# Patient Record
Sex: Female | Born: 1935 | Race: Black or African American | Hispanic: No | Marital: Married | Smoking: Never smoker
Health system: Southern US, Community
[De-identification: ages and names within clinical notes are randomized; demographics above are authoritative.]

## PROBLEM LIST (undated history)

## (undated) DIAGNOSIS — E119 Type 2 diabetes mellitus without complications: Secondary | ICD-10-CM

## (undated) DIAGNOSIS — I4891 Unspecified atrial fibrillation: Secondary | ICD-10-CM

## (undated) DIAGNOSIS — G4733 Obstructive sleep apnea (adult) (pediatric): Secondary | ICD-10-CM

## (undated) DIAGNOSIS — I10 Essential (primary) hypertension: Secondary | ICD-10-CM

## (undated) DIAGNOSIS — I6529 Occlusion and stenosis of unspecified carotid artery: Secondary | ICD-10-CM

## (undated) DIAGNOSIS — F0280 Dementia in other diseases classified elsewhere without behavioral disturbance: Secondary | ICD-10-CM

## (undated) DIAGNOSIS — K759 Inflammatory liver disease, unspecified: Secondary | ICD-10-CM

## (undated) DIAGNOSIS — G9389 Other specified disorders of brain: Secondary | ICD-10-CM

## (undated) DIAGNOSIS — D649 Anemia, unspecified: Secondary | ICD-10-CM

## (undated) DIAGNOSIS — Z9289 Personal history of other medical treatment: Secondary | ICD-10-CM

## (undated) DIAGNOSIS — N183 Chronic kidney disease, stage 3 unspecified: Secondary | ICD-10-CM

## (undated) DIAGNOSIS — B1920 Unspecified viral hepatitis C without hepatic coma: Secondary | ICD-10-CM

## (undated) DIAGNOSIS — E785 Hyperlipidemia, unspecified: Secondary | ICD-10-CM

## (undated) DIAGNOSIS — Z7901 Long term (current) use of anticoagulants: Secondary | ICD-10-CM

## (undated) DIAGNOSIS — I639 Cerebral infarction, unspecified: Secondary | ICD-10-CM

## (undated) DIAGNOSIS — G309 Alzheimer's disease, unspecified: Secondary | ICD-10-CM

## (undated) HISTORY — DX: Chronic kidney disease, stage 3 unspecified: N18.30

## (undated) HISTORY — PX: CATARACT EXTRACTION, BILATERAL: SHX1313

## (undated) HISTORY — DX: Type 2 diabetes mellitus without complications: E11.9

## (undated) HISTORY — DX: Chronic kidney disease, stage 3 (moderate): N18.3

## (undated) HISTORY — DX: Personal history of other medical treatment: Z92.89

## (undated) HISTORY — DX: Essential (primary) hypertension: I10

## (undated) HISTORY — DX: Hyperlipidemia, unspecified: E78.5

---

## 1983-07-04 HISTORY — PX: ABDOMINAL HYSTERECTOMY: SHX81

## 2011-07-18 DIAGNOSIS — E119 Type 2 diabetes mellitus without complications: Secondary | ICD-10-CM | POA: Diagnosis not present

## 2011-07-25 DIAGNOSIS — IMO0001 Reserved for inherently not codable concepts without codable children: Secondary | ICD-10-CM | POA: Diagnosis not present

## 2011-07-25 DIAGNOSIS — E7800 Pure hypercholesterolemia, unspecified: Secondary | ICD-10-CM | POA: Diagnosis not present

## 2011-07-25 DIAGNOSIS — R809 Proteinuria, unspecified: Secondary | ICD-10-CM | POA: Diagnosis not present

## 2011-07-25 DIAGNOSIS — I10 Essential (primary) hypertension: Secondary | ICD-10-CM | POA: Diagnosis not present

## 2011-07-25 DIAGNOSIS — E785 Hyperlipidemia, unspecified: Secondary | ICD-10-CM | POA: Diagnosis not present

## 2011-10-23 DIAGNOSIS — R809 Proteinuria, unspecified: Secondary | ICD-10-CM | POA: Diagnosis not present

## 2011-10-23 DIAGNOSIS — E785 Hyperlipidemia, unspecified: Secondary | ICD-10-CM | POA: Diagnosis not present

## 2011-10-23 DIAGNOSIS — E119 Type 2 diabetes mellitus without complications: Secondary | ICD-10-CM | POA: Diagnosis not present

## 2011-10-30 DIAGNOSIS — IMO0001 Reserved for inherently not codable concepts without codable children: Secondary | ICD-10-CM | POA: Diagnosis not present

## 2011-10-30 DIAGNOSIS — E785 Hyperlipidemia, unspecified: Secondary | ICD-10-CM | POA: Diagnosis not present

## 2011-10-30 DIAGNOSIS — R809 Proteinuria, unspecified: Secondary | ICD-10-CM | POA: Diagnosis not present

## 2011-10-30 DIAGNOSIS — I10 Essential (primary) hypertension: Secondary | ICD-10-CM | POA: Diagnosis not present

## 2011-11-23 DIAGNOSIS — Z1211 Encounter for screening for malignant neoplasm of colon: Secondary | ICD-10-CM | POA: Diagnosis not present

## 2011-11-23 DIAGNOSIS — L290 Pruritus ani: Secondary | ICD-10-CM | POA: Diagnosis not present

## 2011-11-23 DIAGNOSIS — R809 Proteinuria, unspecified: Secondary | ICD-10-CM | POA: Diagnosis not present

## 2011-11-23 DIAGNOSIS — I10 Essential (primary) hypertension: Secondary | ICD-10-CM | POA: Diagnosis not present

## 2011-11-23 DIAGNOSIS — E119 Type 2 diabetes mellitus without complications: Secondary | ICD-10-CM | POA: Diagnosis not present

## 2012-08-01 DIAGNOSIS — E119 Type 2 diabetes mellitus without complications: Secondary | ICD-10-CM | POA: Diagnosis not present

## 2012-08-08 DIAGNOSIS — E119 Type 2 diabetes mellitus without complications: Secondary | ICD-10-CM | POA: Diagnosis not present

## 2012-08-08 DIAGNOSIS — I10 Essential (primary) hypertension: Secondary | ICD-10-CM | POA: Diagnosis not present

## 2012-08-08 DIAGNOSIS — E785 Hyperlipidemia, unspecified: Secondary | ICD-10-CM | POA: Diagnosis not present

## 2012-10-28 DIAGNOSIS — E785 Hyperlipidemia, unspecified: Secondary | ICD-10-CM | POA: Diagnosis not present

## 2012-10-28 DIAGNOSIS — E119 Type 2 diabetes mellitus without complications: Secondary | ICD-10-CM | POA: Diagnosis not present

## 2012-10-28 DIAGNOSIS — I10 Essential (primary) hypertension: Secondary | ICD-10-CM | POA: Diagnosis not present

## 2012-11-19 DIAGNOSIS — I10 Essential (primary) hypertension: Secondary | ICD-10-CM | POA: Diagnosis not present

## 2013-03-13 DIAGNOSIS — I10 Essential (primary) hypertension: Secondary | ICD-10-CM | POA: Diagnosis not present

## 2013-03-13 DIAGNOSIS — Z1231 Encounter for screening mammogram for malignant neoplasm of breast: Secondary | ICD-10-CM | POA: Diagnosis not present

## 2013-03-13 DIAGNOSIS — E119 Type 2 diabetes mellitus without complications: Secondary | ICD-10-CM | POA: Diagnosis not present

## 2013-03-13 DIAGNOSIS — E785 Hyperlipidemia, unspecified: Secondary | ICD-10-CM | POA: Diagnosis not present

## 2013-10-20 DIAGNOSIS — E1129 Type 2 diabetes mellitus with other diabetic kidney complication: Secondary | ICD-10-CM | POA: Diagnosis not present

## 2013-10-20 DIAGNOSIS — L538 Other specified erythematous conditions: Secondary | ICD-10-CM | POA: Diagnosis not present

## 2013-10-20 DIAGNOSIS — I2510 Atherosclerotic heart disease of native coronary artery without angina pectoris: Secondary | ICD-10-CM | POA: Diagnosis not present

## 2013-10-20 DIAGNOSIS — E1165 Type 2 diabetes mellitus with hyperglycemia: Secondary | ICD-10-CM | POA: Diagnosis not present

## 2013-10-20 DIAGNOSIS — N182 Chronic kidney disease, stage 2 (mild): Secondary | ICD-10-CM | POA: Diagnosis not present

## 2013-10-20 DIAGNOSIS — I10 Essential (primary) hypertension: Secondary | ICD-10-CM | POA: Diagnosis not present

## 2013-10-20 DIAGNOSIS — E785 Hyperlipidemia, unspecified: Secondary | ICD-10-CM | POA: Diagnosis not present

## 2013-11-04 LAB — HM DIABETES EYE EXAM

## 2013-11-09 ENCOUNTER — Inpatient Hospital Stay: Payer: Self-pay | Admitting: Internal Medicine

## 2013-11-09 DIAGNOSIS — J9819 Other pulmonary collapse: Secondary | ICD-10-CM | POA: Diagnosis not present

## 2013-11-09 DIAGNOSIS — T383X1A Poisoning by insulin and oral hypoglycemic [antidiabetic] drugs, accidental (unintentional), initial encounter: Secondary | ICD-10-CM | POA: Diagnosis present

## 2013-11-09 DIAGNOSIS — E785 Hyperlipidemia, unspecified: Secondary | ICD-10-CM | POA: Diagnosis present

## 2013-11-09 DIAGNOSIS — Z7982 Long term (current) use of aspirin: Secondary | ICD-10-CM | POA: Diagnosis not present

## 2013-11-09 DIAGNOSIS — E119 Type 2 diabetes mellitus without complications: Secondary | ICD-10-CM | POA: Diagnosis not present

## 2013-11-09 DIAGNOSIS — R4182 Altered mental status, unspecified: Secondary | ICD-10-CM | POA: Diagnosis not present

## 2013-11-09 DIAGNOSIS — E162 Hypoglycemia, unspecified: Secondary | ICD-10-CM | POA: Diagnosis not present

## 2013-11-09 DIAGNOSIS — E1169 Type 2 diabetes mellitus with other specified complication: Secondary | ICD-10-CM | POA: Diagnosis present

## 2013-11-09 DIAGNOSIS — I10 Essential (primary) hypertension: Secondary | ICD-10-CM | POA: Diagnosis present

## 2013-11-09 LAB — CBC
HCT: 36.3 % (ref 35.0–47.0)
HGB: 12.1 g/dL (ref 12.0–16.0)
MCH: 29.1 pg (ref 26.0–34.0)
MCHC: 33.5 g/dL (ref 32.0–36.0)
MCV: 87 fL (ref 80–100)
Platelet: 183 10*3/uL (ref 150–440)
RBC: 4.18 10*6/uL (ref 3.80–5.20)
RDW: 14 % (ref 11.5–14.5)
WBC: 5.5 10*3/uL (ref 3.6–11.0)

## 2013-11-09 LAB — URINALYSIS, COMPLETE
Bacteria: NONE SEEN
Bilirubin,UR: NEGATIVE
Blood: NEGATIVE
Glucose,UR: NEGATIVE mg/dL (ref 0–75)
Ketone: NEGATIVE
Nitrite: NEGATIVE
Ph: 7 (ref 4.5–8.0)
Protein: 30
RBC,UR: <1 /HPF (ref 0–5)
Specific Gravity: 1.011 (ref 1.003–1.030)
Squamous Epithelial: 1
WBC UR: 3 /HPF (ref 0–5)

## 2013-11-09 LAB — COMPREHENSIVE METABOLIC PANEL
Albumin: 3.8 g/dL (ref 3.4–5.0)
Alkaline Phosphatase: 59 U/L
Anion Gap: 5 — ABNORMAL LOW (ref 7–16)
BUN: 24 mg/dL — ABNORMAL HIGH (ref 7–18)
Bilirubin,Total: 0.2 mg/dL (ref 0.2–1.0)
Calcium, Total: 9.4 mg/dL (ref 8.5–10.1)
Chloride: 106 mmol/L (ref 98–107)
Co2: 30 mmol/L (ref 21–32)
Creatinine: 1.19 mg/dL (ref 0.60–1.30)
EGFR (African American): 51 — ABNORMAL LOW
EGFR (Non-African Amer.): 44 — ABNORMAL LOW
Glucose: 33 mg/dL — CL (ref 65–99)
Osmolality: 282 (ref 275–301)
Potassium: 4.4 mmol/L (ref 3.5–5.1)
SGOT(AST): 29 U/L (ref 15–37)
SGPT (ALT): 27 U/L (ref 12–78)
Sodium: 141 mmol/L (ref 136–145)
Total Protein: 9 g/dL — ABNORMAL HIGH (ref 6.4–8.2)

## 2013-11-09 LAB — TROPONIN I: Troponin-I: <0.02 ng/mL

## 2013-11-10 DIAGNOSIS — E162 Hypoglycemia, unspecified: Secondary | ICD-10-CM | POA: Diagnosis not present

## 2013-11-10 DIAGNOSIS — I10 Essential (primary) hypertension: Secondary | ICD-10-CM | POA: Diagnosis not present

## 2013-11-10 DIAGNOSIS — E119 Type 2 diabetes mellitus without complications: Secondary | ICD-10-CM | POA: Diagnosis not present

## 2013-11-10 LAB — BASIC METABOLIC PANEL
Anion Gap: 5 — ABNORMAL LOW (ref 7–16)
BUN: 22 mg/dL — ABNORMAL HIGH (ref 7–18)
Calcium, Total: 8.4 mg/dL — ABNORMAL LOW (ref 8.5–10.1)
Chloride: 110 mmol/L — ABNORMAL HIGH (ref 98–107)
Co2: 27 mmol/L (ref 21–32)
Creatinine: 1.12 mg/dL (ref 0.60–1.30)
EGFR (African American): 54 — ABNORMAL LOW
EGFR (Non-African Amer.): 47 — ABNORMAL LOW
Glucose: 70 mg/dL (ref 65–99)
Osmolality: 285 (ref 275–301)
Potassium: 4.6 mmol/L (ref 3.5–5.1)
Sodium: 142 mmol/L (ref 136–145)

## 2013-11-10 LAB — HEMOGLOBIN A1C
Hemoglobin A1C: 7.1 % — ABNORMAL HIGH (ref 4.2–6.3)
Hemoglobin A1C: 6.9 % — ABNORMAL HIGH (ref 4.2–6.3)

## 2013-11-11 DIAGNOSIS — E162 Hypoglycemia, unspecified: Secondary | ICD-10-CM | POA: Diagnosis not present

## 2013-11-11 DIAGNOSIS — I10 Essential (primary) hypertension: Secondary | ICD-10-CM | POA: Diagnosis not present

## 2013-11-11 DIAGNOSIS — E119 Type 2 diabetes mellitus without complications: Secondary | ICD-10-CM | POA: Diagnosis not present

## 2013-12-12 ENCOUNTER — Ambulatory Visit: Payer: Self-pay | Admitting: Adult Health

## 2014-01-13 ENCOUNTER — Ambulatory Visit (INDEPENDENT_AMBULATORY_CARE_PROVIDER_SITE_OTHER): Payer: Medicare Other | Admitting: Adult Health

## 2014-01-13 ENCOUNTER — Other Ambulatory Visit: Payer: Self-pay

## 2014-01-13 ENCOUNTER — Encounter: Payer: Self-pay | Admitting: Adult Health

## 2014-01-13 VITALS — BP 138/83 | HR 78 | Temp 97.9°F | Resp 14 | Ht 65.0 in | Wt 196.5 lb

## 2014-01-13 DIAGNOSIS — E785 Hyperlipidemia, unspecified: Secondary | ICD-10-CM

## 2014-01-13 DIAGNOSIS — I10 Essential (primary) hypertension: Secondary | ICD-10-CM | POA: Diagnosis not present

## 2014-01-13 MED ORDER — GLUCOSE BLOOD VI STRP: ORAL_STRIP | Status: DC

## 2014-01-13 MED ORDER — ONETOUCH ULTRASOFT LANCETS MISC: Status: DC

## 2014-01-13 NOTE — Progress Notes (Signed)
Pre visit review using our clinic review tool, if applicable. No additional management support is needed unless otherwise documented below in the visit note. 

## 2014-01-13 NOTE — Progress Notes (Signed)
[name], female   DOB: Mar 09, 1934, 81 y.o.   MRN: [id number]   Subjective:    [name], female    DOB: Sep 15, 1933, 80 y.o.   MRN: [id number]  HPI  Patient is a pleasant 80 year old female who presents to clinic to establish care. She moved to the [place] area from [place] in May 2015. Recent hospitalization at [facility] for hypoglycemic event. She reports being hospitalized for 2 days. She was on a sulfonylurea and this was discontinued prior to discharge from the hospital. Now only on metformin twice a day. Today, she is feeling well. No concerns. She has a hx of HTN, HLD. Will request records from previous PCP.   Past Medical History  Diagnosis Date  . Diabetes mellitus without complication   . Hypertension   . Hyperlipidemia   . History of blood transfusion      Past Surgical History  Procedure Laterality Date  . Abdominal hysterectomy  1985     Family History  Problem Relation Age of Onset  . Stroke Mother   . Diabetes Mother   . Heart disease Father   . Kidney disease Sister   . Diabetes Sister   . Kidney disease Brother   . Heart disease Sister   . Diabetes Sister   . Diabetes Sister   . Diabetes Sister   . Kidney disease Sister   . Heart disease Sister   . Kidney disease Brother     kidney transplant  . Early death Brother 65    Truck Accident - died  . Heart disease Brother      History   Social History  . Marital Status: Married    Spouse Name: N/A    Number of Children: 2  . Years of Education: N/A   Occupational History  . 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 Smoking status: Never Smoker   . Smokeless tobacco: Not on file  . Alcohol Use: No  . Drug Use: No  . Sexual Activity: Not on file   Other Topics Concern  . Not on file   Social History Narrative   She is married and has two adult children (adopted). She moved to [place] approximately 50 years ago for work. Her  and her husband moved back to [facility] in May.       Hobbies: Decorating   Exercise: None   Caffeine: 4 cups a week     Review of Systems  Constitutional: Negative.   HENT: Negative.   Eyes: Negative.   Respiratory: Negative.   Cardiovascular: Negative.   Gastrointestinal: Negative.   Endocrine: Negative.   Genitourinary: Negative.   Musculoskeletal: Negative.   Skin: Negative.   Allergic/Immunologic: Negative.   Neurological: Negative.   Hematological: Negative.   Psychiatric/Behavioral: Negative.        Objective:  BP 138/83  Pulse 78  Temp(Src) 97.9 F (36.6 C) (Oral)  Resp 14  Ht 5\' 5"  (1.651 m)  Wt 196 lb 8 oz (89.132 kg)  BMI 32.70 kg/m2  SpO2 99%   Physical Exam  Constitutional: She is oriented to person, place, and time. No distress.  Cardiovascular: Normal rate and regular rhythm.   Pulmonary/Chest: Effort normal. No respiratory distress.  Musculoskeletal: Normal range of motion.  Neurological: She is alert and oriented to person, place, and time.  Skin: Skin is warm and dry.  Psychiatric: She has a normal mood and affect.  Her behavior is normal. Judgment and thought content normal.      Assessment & Plan:   1. Essential hypertension Check metabolic panel. Continue medications. Follow - Basic metabolic panel  2. HLD (hyperlipidemia) She will need to return for fasting lipids. She does not recall when the last level was drawn. - Lipid panel; Future

## 2014-01-14 ENCOUNTER — Telehealth: Payer: Self-pay | Admitting: Adult Health

## 2014-01-14 NOTE — Telephone Encounter (Signed)
Relevant patient education mailed to patient.  

## 2014-01-29 ENCOUNTER — Ambulatory Visit (INDEPENDENT_AMBULATORY_CARE_PROVIDER_SITE_OTHER): Payer: Medicare Other | Admitting: Adult Health

## 2014-01-29 ENCOUNTER — Encounter: Payer: Self-pay | Admitting: Adult Health

## 2014-01-29 ENCOUNTER — Telehealth: Payer: Self-pay | Admitting: Adult Health

## 2014-01-29 ENCOUNTER — Other Ambulatory Visit: Payer: Self-pay | Admitting: Adult Health

## 2014-01-29 VITALS — BP 138/79 | HR 81 | Temp 97.9°F | Resp 14 | Ht 65.0 in | Wt 195.5 lb

## 2014-01-29 DIAGNOSIS — Z0000 Encounter for general adult medical examination without abnormal findings: Secondary | ICD-10-CM | POA: Diagnosis not present

## 2014-01-29 DIAGNOSIS — IMO0002 Reserved for concepts with insufficient information to code with codable children: Secondary | ICD-10-CM

## 2014-01-29 DIAGNOSIS — E119 Type 2 diabetes mellitus without complications: Secondary | ICD-10-CM | POA: Insufficient documentation

## 2014-01-29 DIAGNOSIS — E1165 Type 2 diabetes mellitus with hyperglycemia: Secondary | ICD-10-CM

## 2014-01-29 DIAGNOSIS — E785 Hyperlipidemia, unspecified: Secondary | ICD-10-CM | POA: Diagnosis not present

## 2014-01-29 DIAGNOSIS — R7989 Other specified abnormal findings of blood chemistry: Secondary | ICD-10-CM

## 2014-01-29 DIAGNOSIS — Z13820 Encounter for screening for osteoporosis: Secondary | ICD-10-CM | POA: Diagnosis not present

## 2014-01-29 DIAGNOSIS — E1129 Type 2 diabetes mellitus with other diabetic kidney complication: Secondary | ICD-10-CM | POA: Insufficient documentation

## 2014-01-29 DIAGNOSIS — IMO0001 Reserved for inherently not codable concepts without codable children: Secondary | ICD-10-CM

## 2014-01-29 LAB — LIPID PANEL
Cholesterol: 159 mg/dL (ref 0–200)
HDL: 40.6 mg/dL (ref >39.00)
LDL Cholesterol: 95 mg/dL (ref 0–99)
NonHDL: 118.4
Total CHOL/HDL Ratio: 4
Triglycerides: 119 mg/dL (ref 0.0–149.0)
VLDL: 23.8 mg/dL (ref 0.0–40.0)

## 2014-01-29 LAB — BASIC METABOLIC PANEL
BUN: 22 mg/dL (ref 6–23)
CO2: 23 mEq/L (ref 19–32)
Calcium: 8.9 mg/dL (ref 8.4–10.5)
Chloride: 102 mEq/L (ref 96–112)
Creatinine, Ser: 1.3 mg/dL — ABNORMAL HIGH (ref 0.4–1.2)
GFR: 52.77 mL/min — ABNORMAL LOW (ref >60.00)
Glucose, Bld: 352 mg/dL — ABNORMAL HIGH (ref 70–99)
Potassium: 4.6 mEq/L (ref 3.5–5.1)
Sodium: 134 mEq/L — ABNORMAL LOW (ref 135–145)

## 2014-01-29 LAB — HEMOGLOBIN A1C: Hgb A1c MFr Bld: 10.8 % — ABNORMAL HIGH (ref 4.6–6.5)

## 2014-01-29 NOTE — Progress Notes (Signed)
Pre visit review using our clinic review tool, if applicable. No additional management support is needed unless otherwise documented below in the visit note. 

## 2014-01-29 NOTE — Telephone Encounter (Signed)
Pt dropped off information about the two medications that she had stopped taking. Information is in  Brianna Dodson's box.

## 2014-01-29 NOTE — Progress Notes (Signed)
[name], female   DOB: [birth year], 80 y.o.   MRN: [id number]    Subjective:    [name], female    DOB: [birth year], 80 y.o.   MRN: [id number]  HPI  The patient is here for annual Medicare wellness examination.   The risk factors are reflected in the social history.  The roster of all physicians providing medical care to patient is listed in the Snapshot section of the chart.  Activities of daily living:  The patient is 100% independent in all ADLs: dressing, toileting, bathing, feeding as well as independent mobility.  Instrumental Activities of daily living: The patient is 100% independent in all iADLs: cooking, driving, keeping track of finances, managing medications, shopping, using telephone and computer.  Home safety: The patient has smoke detectors in the home. Seatbelts are worn 100%.  There are no firearms at home. There is no violence in the home. No hx of IPV.  There is no risks for hepatitis, STDs or HIV. There is no history of blood transfusion. No travel history to infectious disease endemic areas of the world.  The patient has seen dentist in the last six month. Pt has seen eye doctor in May 2015. No glaucoma. Does not wear glasses. No hearing impairment. They have deferred audiologic testing in the last year.  No excessive sun exposure. Discussed the need for sun protection: hats, long sleeves and use of sunscreen if there is significant sun exposure.   Diet: the importance of a healthy diet is discussed. Pt follows a healthy diet.  The benefits of regular aerobic exercise were discussed. She does not exercise.  Depression screen: there are no signs or vegative symptoms of depression- irritability, change in appetite, anhedonia, sadness/tearfullness.  Cognitive assessment: the patient manages all their financial and personal affairs and is actively engaged. Able to relate day,date,year and events; recalled 2/3 objects at 3 minutes;  performed clock-face test normally.  The following portions of the patient's history were reviewed and updated as appropriate: allergies, current medications, past family history, past medical history,  past surgical history, past social history  and problem list.  Visual acuity was not assessed per patient preference since has regular follow up with ophthalmologist. Hearing and body mass index were assessed and reviewed.   During the course of the visit the patient was educated and counseled about appropriate screening and preventive services including : fall prevention , diabetes screening, nutrition counseling, colorectal cancer screening, and recommended immunizations.    Pt refuses flu vaccine, pna vaccine, shingles vaccine.  She reports being up to date on her tetanus (within 10 years). Just moved from [place].  Cholesterol screening will be done today.  She does not wish to have a mammogram this year.   She is agreeable to have a bone density.    Past Medical History  Diagnosis Date  . Diabetes mellitus without complication   . Hypertension   . Hyperlipidemia   . History of blood transfusion     Current Outpatient Prescriptions on File Prior to Visit  Medication Sig Dispense Refill  . amLODipine (NORVASC) 10 MG tablet Take 10 mg by mouth daily.      Marland Kitchen aspirin 81 MG tablet Take 81 mg by mouth daily.      Marland Kitchen atorvastatin (LIPITOR) 40 MG tablet Take 40 mg by mouth daily.      Marland Kitchen glucose blood test strip Use as instructed  100 each  3  . Lancets (ONETOUCH ULTRASOFT) lancets  Use as instructed  100 each  6  . lisinopril (PRINIVIL,ZESTRIL) 40 MG tablet Take 40 mg by mouth daily.      . metFORMIN (GLUCOPHAGE) 500 MG tablet Take by mouth 2 (two) times daily with a meal.       No current facility-administered medications on file prior to visit.     Review of Systems No review of systems - Wellness Exam    Objective:  BP 138/79  Pulse 81  Temp(Src) 97.9 F (36.6 C) (Oral)  Resp  14  Wt 195 lb 8 oz (88.678 kg)  SpO2 99%   Physical Exam  No physical exam - wellness exam.      Assessment & Plan:   1. Medicare annual wellness visit, subsequent All screenings were addressed. Pt refused all vaccines. She does not wish to have a mammogram. No colonoscopy give age > 52 - Basic Metabolic Panel (BMET)  2. Diabetes type 2, uncontrolled Check labs. Follow - Hemoglobin A1c; Future - Hemoglobin 123456 - Basic Metabolic Panel (BMET)  3. HLD (hyperlipidemia) Screening labs for HLD - Lipid panel - Lipid panel - Basic Metabolic Panel (BMET)  4. Screening for osteoporosis Bone density ordered. Has never had one done.

## 2014-01-30 NOTE — Telephone Encounter (Signed)
List placed in Raquel's folder.

## 2014-02-12 ENCOUNTER — Encounter: Payer: Self-pay | Admitting: Adult Health

## 2014-02-12 ENCOUNTER — Ambulatory Visit (INDEPENDENT_AMBULATORY_CARE_PROVIDER_SITE_OTHER): Payer: Medicare Other | Admitting: Adult Health

## 2014-02-12 VITALS — BP 135/76 | HR 58 | Temp 97.9°F | Resp 14 | Ht 65.0 in | Wt 202.8 lb

## 2014-02-12 DIAGNOSIS — IMO0001 Reserved for inherently not codable concepts without codable children: Secondary | ICD-10-CM | POA: Diagnosis not present

## 2014-02-12 DIAGNOSIS — IMO0002 Reserved for concepts with insufficient information to code with codable children: Secondary | ICD-10-CM

## 2014-02-12 DIAGNOSIS — E1165 Type 2 diabetes mellitus with hyperglycemia: Secondary | ICD-10-CM

## 2014-02-12 MED ORDER — METFORMIN HCL 500 MG PO TABS: 500.0000 mg | ORAL_TABLET | Freq: Two times a day (BID) | ORAL | Status: DC

## 2014-02-12 MED ORDER — LISINOPRIL 40 MG PO TABS: 40.0000 mg | ORAL_TABLET | Freq: Every day | ORAL | Status: DC

## 2014-02-12 MED ORDER — AMLODIPINE BESYLATE 10 MG PO TABS: 10.0000 mg | ORAL_TABLET | Freq: Every day | ORAL | Status: DC

## 2014-02-12 MED ORDER — EMPAGLIFLOZIN-LINAGLIPTIN 10-5 MG PO TABS: 1.0000 | ORAL_TABLET | Freq: Every day | ORAL | Status: DC

## 2014-02-12 MED ORDER — ATORVASTATIN CALCIUM 40 MG PO TABS: 40.0000 mg | ORAL_TABLET | Freq: Every day | ORAL | Status: DC

## 2014-02-12 NOTE — Progress Notes (Signed)
Pre visit review using our clinic review tool, if applicable. No additional management support is needed unless otherwise documented below in the visit note. 

## 2014-02-12 NOTE — Progress Notes (Signed)
[name], female   DOB: September 07, 1933, 80 y.o.   MRN: [id number]    Subjective:    [name], female    DOB: 04-02-1934, 80 y.o.   MRN: [id number]  HPI Pt is a 80 y/o female with diabetes type 2 uncontrolled who presents to clinic to discuss her diabetes medication and side effects. Patient was recently hospitalized for hypoglycemic event. She was taking off sulfonylureas. Her most recent A1c is 10.8%. We discussed options of doing a basal insulin however patient restarted her glimepiride and Actos. She reports that since starting both these medications she is feeling tingling in her fingertips. She wants to stop taking metformin and Actos and continue the glimepiride  to see if her symptoms subside. She reports that her fasting finger stick blood sugar are running anywhere between 118 and 139.  Past Medical History  Diagnosis Date  . Diabetes mellitus without complication   . Hypertension   . Hyperlipidemia   . History of blood transfusion     Current Outpatient Prescriptions on File Prior to Visit  Medication Sig Dispense Refill  . amLODipine (NORVASC) 10 MG tablet Take 10 mg by mouth daily.      Marland Kitchen aspirin 81 MG tablet Take 81 mg by mouth daily.      Marland Kitchen atorvastatin (LIPITOR) 40 MG tablet Take 40 mg by mouth daily.      Marland Kitchen glucose blood test strip Use as instructed  100 each  3  . Lancets (ONETOUCH ULTRASOFT) lancets Use as instructed  100 each  6  . lisinopril (PRINIVIL,ZESTRIL) 40 MG tablet Take 40 mg by mouth daily.      . metFORMIN (GLUCOPHAGE) 500 MG tablet Take by mouth 2 (two) times daily with a meal.       No current facility-administered medications on file prior to visit.     Review of Systems  Constitutional: Negative.   HENT: Negative.   Eyes: Negative.   Respiratory: Negative.   Cardiovascular: Negative.   Gastrointestinal: Negative.   Endocrine: Negative.   Genitourinary: Negative.   Musculoskeletal: Negative.   Skin: Negative.     Allergic/Immunologic: Negative.   Neurological:       Tingling in fingers  Hematological: Negative.   Psychiatric/Behavioral: Negative.        Objective:  BP 135/76  Pulse 58  Temp(Src) 97.9 F (36.6 C) (Oral)  Resp 14  Ht 5\' 5"  (1.651 m)  Wt 202 lb 12 oz (91.967 kg)  BMI 33.74 kg/m2  SpO2 97%   Physical Exam  Constitutional: She is oriented to person, place, and time. No distress.  Cardiovascular: Normal rate and regular rhythm.   Pulmonary/Chest: Effort normal. No respiratory distress.  Musculoskeletal: Normal range of motion.  Neurological: She is alert and oriented to person, place, and time.  Skin: Skin is warm and dry.  Psychiatric: She has a normal mood and affect. Her behavior is normal. Judgment and thought content normal.      Assessment & Plan:   1. Diabetes type 2, uncontrolled Suggested that we try Glyxambi to see if this improves her blood glucose and A1c levels. Explained that medication does not have side effects of hypoglycemia like the sulfonylureas. She is agreeable to try. She will stop both the glimepiride and the actos. Continue metformin and start Glyxambi. I provided her with a one year discount card. She has Medicare but reports not having a part D and uses the [facility] for her medication. Prescription provided to  see if she is able to get the one year free. Last GFR was > 45.

## 2014-02-27 DIAGNOSIS — E1165 Type 2 diabetes mellitus with hyperglycemia: Secondary | ICD-10-CM | POA: Diagnosis not present

## 2014-02-27 DIAGNOSIS — D631 Anemia in chronic kidney disease: Secondary | ICD-10-CM | POA: Diagnosis not present

## 2014-02-27 DIAGNOSIS — R809 Proteinuria, unspecified: Secondary | ICD-10-CM | POA: Diagnosis not present

## 2014-02-27 DIAGNOSIS — N183 Chronic kidney disease, stage 3 unspecified: Secondary | ICD-10-CM | POA: Diagnosis not present

## 2014-02-27 DIAGNOSIS — N2581 Secondary hyperparathyroidism of renal origin: Secondary | ICD-10-CM | POA: Diagnosis not present

## 2014-02-27 DIAGNOSIS — E1129 Type 2 diabetes mellitus with other diabetic kidney complication: Secondary | ICD-10-CM | POA: Diagnosis not present

## 2014-02-27 DIAGNOSIS — I10 Essential (primary) hypertension: Secondary | ICD-10-CM | POA: Diagnosis not present

## 2014-03-12 ENCOUNTER — Encounter: Payer: Self-pay | Admitting: Adult Health

## 2014-03-26 DIAGNOSIS — N183 Chronic kidney disease, stage 3 unspecified: Secondary | ICD-10-CM | POA: Diagnosis not present

## 2014-04-02 DIAGNOSIS — E1165 Type 2 diabetes mellitus with hyperglycemia: Secondary | ICD-10-CM | POA: Diagnosis not present

## 2014-04-02 DIAGNOSIS — N183 Chronic kidney disease, stage 3 (moderate): Secondary | ICD-10-CM | POA: Diagnosis not present

## 2014-04-02 DIAGNOSIS — I129 Hypertensive chronic kidney disease with stage 1 through stage 4 chronic kidney disease, or unspecified chronic kidney disease: Secondary | ICD-10-CM | POA: Diagnosis not present

## 2014-04-02 DIAGNOSIS — R809 Proteinuria, unspecified: Secondary | ICD-10-CM | POA: Diagnosis not present

## 2014-04-02 DIAGNOSIS — E1121 Type 2 diabetes mellitus with diabetic nephropathy: Secondary | ICD-10-CM | POA: Diagnosis not present

## 2014-04-07 ENCOUNTER — Telehealth: Payer: Self-pay

## 2014-04-07 MED ORDER — GLUCOSE BLOOD VI STRP: ORAL_STRIP | Status: DC

## 2014-04-07 MED ORDER — ONETOUCH ULTRASOFT LANCETS MISC: Status: DC

## 2014-04-07 NOTE — Telephone Encounter (Signed)
The patient called and is hoping to get a refill on her test strips and lancets for her diabetes monitoring.  She was last seen by R.Rey in August.     Pharmacy - [facility] in [place] - [phone]

## 2014-07-16 DIAGNOSIS — I10 Essential (primary) hypertension: Secondary | ICD-10-CM | POA: Diagnosis not present

## 2014-07-16 DIAGNOSIS — N2581 Secondary hyperparathyroidism of renal origin: Secondary | ICD-10-CM | POA: Diagnosis not present

## 2014-07-16 DIAGNOSIS — N183 Chronic kidney disease, stage 3 (moderate): Secondary | ICD-10-CM | POA: Diagnosis not present

## 2014-07-16 DIAGNOSIS — E1122 Type 2 diabetes mellitus with diabetic chronic kidney disease: Secondary | ICD-10-CM | POA: Diagnosis not present

## 2014-07-16 DIAGNOSIS — D631 Anemia in chronic kidney disease: Secondary | ICD-10-CM | POA: Diagnosis not present

## 2014-07-16 DIAGNOSIS — R809 Proteinuria, unspecified: Secondary | ICD-10-CM | POA: Diagnosis not present

## 2014-08-03 ENCOUNTER — Encounter: Payer: Self-pay | Admitting: Internal Medicine

## 2014-08-03 ENCOUNTER — Ambulatory Visit (INDEPENDENT_AMBULATORY_CARE_PROVIDER_SITE_OTHER): Payer: Medicare Other | Admitting: Internal Medicine

## 2014-08-03 ENCOUNTER — Encounter (INDEPENDENT_AMBULATORY_CARE_PROVIDER_SITE_OTHER): Payer: Self-pay

## 2014-08-03 VITALS — BP 124/78 | HR 65 | Temp 97.9°F | Ht 64.5 in | Wt 188.0 lb

## 2014-08-03 DIAGNOSIS — E1122 Type 2 diabetes mellitus with diabetic chronic kidney disease: Secondary | ICD-10-CM | POA: Diagnosis not present

## 2014-08-03 DIAGNOSIS — E785 Hyperlipidemia, unspecified: Secondary | ICD-10-CM

## 2014-08-03 DIAGNOSIS — I10 Essential (primary) hypertension: Secondary | ICD-10-CM | POA: Diagnosis not present

## 2014-08-03 DIAGNOSIS — N189 Chronic kidney disease, unspecified: Secondary | ICD-10-CM

## 2014-08-03 DIAGNOSIS — E669 Obesity, unspecified: Secondary | ICD-10-CM | POA: Diagnosis not present

## 2014-08-03 LAB — LIPID PANEL
Cholesterol: 168 mg/dL (ref 0–200)
HDL: 35.8 mg/dL — ABNORMAL LOW (ref >39.00)
LDL Cholesterol: 97 mg/dL (ref 0–99)
NonHDL: 132.2
Total CHOL/HDL Ratio: 5
Triglycerides: 175 mg/dL — ABNORMAL HIGH (ref 0.0–149.0)
VLDL: 35 mg/dL (ref 0.0–40.0)

## 2014-08-03 LAB — CBC
HCT: 35.9 % — ABNORMAL LOW (ref 36.0–46.0)
Hemoglobin: 12.1 g/dL (ref 12.0–15.0)
MCHC: 33.6 g/dL (ref 30.0–36.0)
MCV: 84.5 fl (ref 78.0–100.0)
Platelets: 178 10*3/uL (ref 150.0–400.0)
RBC: 4.25 Mil/uL (ref 3.87–5.11)
RDW: 13.8 % (ref 11.5–15.5)
WBC: 4.7 10*3/uL (ref 4.0–10.5)

## 2014-08-03 LAB — MICROALBUMIN / CREATININE URINE RATIO
Creatinine,U: 71.9 mg/dL
Microalb Creat Ratio: 17.1 mg/g (ref 0.0–30.0)
Microalb, Ur: 12.3 mg/dL — ABNORMAL HIGH (ref 0.0–1.9)

## 2014-08-03 LAB — COMPREHENSIVE METABOLIC PANEL
ALT: 14 U/L (ref 0–35)
AST: 16 U/L (ref 0–37)
Albumin: 4.1 g/dL (ref 3.5–5.2)
Alkaline Phosphatase: 54 U/L (ref 39–117)
BUN: 22 mg/dL (ref 6–23)
CO2: 26 mEq/L (ref 19–32)
Calcium: 9.3 mg/dL (ref 8.4–10.5)
Chloride: 104 mEq/L (ref 96–112)
Creatinine, Ser: 1.16 mg/dL (ref 0.40–1.20)
GFR: 57.98 mL/min — ABNORMAL LOW (ref >60.00)
Glucose, Bld: 198 mg/dL — ABNORMAL HIGH (ref 70–99)
Potassium: 4.6 mEq/L (ref 3.5–5.1)
Sodium: 138 mEq/L (ref 135–145)
Total Bilirubin: 0.7 mg/dL (ref 0.2–1.2)
Total Protein: 7.9 g/dL (ref 6.0–8.3)

## 2014-08-03 LAB — HEMOGLOBIN A1C: Hgb A1c MFr Bld: 8 % — ABNORMAL HIGH (ref 4.6–6.5)

## 2014-08-03 NOTE — Patient Instructions (Signed)

## 2014-08-03 NOTE — Assessment & Plan Note (Signed)
Encouraged her to work on diet and exercise 

## 2014-08-03 NOTE — Assessment & Plan Note (Signed)
Well controlled on amlodipine and Lisinopril Will repeat CBC and CMET today She does not need medications refilled today

## 2014-08-03 NOTE — Assessment & Plan Note (Signed)
Sugars elevated but she know this is related to poor diet Reinforced low carb diet Will repeat A1C and Microalbumin today Foot exam today She declines flu and pneumonia vaccines Gave her information for [facility] and advised her to make an appt for a diabetic eye exam

## 2014-08-03 NOTE — Assessment & Plan Note (Signed)
Last LDL at goal, 95 (01/2014) Will repeat CMET and Lipid profile today She does not need medication refills today Encouraged low fat diet

## 2014-08-03 NOTE — Progress Notes (Signed)
Pre visit review using our clinic review tool, if applicable. No additional management support is needed unless otherwise documented below in the visit note. 

## 2014-08-03 NOTE — Progress Notes (Signed)
HPI  Pt presents to the clinic today to establish care and for management of the conditions listed below. She is transferring care from Charolette Forward, NP at [facility].  HTN: BP well controlled on Amlodipine and Lisinopril. She is taking the medications as directed. She denies adverse effects. Her BP today 124/78.  DM2: Fasting sugars range 70-200. She is taking Metformin and Glyxambi. Her last eye exam 10/2013- no retinopathy. She has not established a eye doctor here. She does take not take flu or pneumonia shots.  Obesity: She reports she has lost 10 lbs in the last 3 months. She reports she has not been trying to lose weight but she has been very busy since she moved down here. Her BMI is 31.  HLD: She denies myalgias or joint pains on Lipitor. She has not had her cholesterol checked in the last 6 months.  CKD: She is following with Nephrology at [facility]. Last creatinine 1.49.  Flu: never Tetanus: < 10 years Pneumonia Vaccine: never Zostovax: never Pap Smear: Complete Hysterectomy 1985 Mammogram: 10/2013, no longer wants to screen Bone Density: Des not think she has ever had one. Colonsocopy: Has has one but cannot remember the year Vision Screening: Annually, has not established one down here Dentist: Dental works 07/2014  Past Medical History  Diagnosis Date  . Diabetes mellitus without complication   . Hypertension   . Hyperlipidemia   . History of blood transfusion   . CKD (chronic kidney disease), stage III     Current Outpatient Prescriptions  Medication Sig Dispense Refill  . amLODipine (NORVASC) 10 MG tablet Take 1 tablet (10 mg total) by mouth daily. 90 tablet 1  . aspirin 81 MG tablet Take 81 mg by mouth daily.    Marland Kitchen atorvastatin (LIPITOR) 40 MG tablet Take 1 tablet (40 mg total) by mouth daily. 90 tablet 1  . Empagliflozin-Linagliptin (GLYXAMBI) 10-5 MG TABS Take 1 tablet by mouth daily. 30 tablet 11  . glucose blood test strip Check sugar twice daily 100  each 5  . Lancets (ONETOUCH ULTRASOFT) lancets Use as instructed 100 each 6  . lisinopril (PRINIVIL,ZESTRIL) 40 MG tablet Take 1 tablet (40 mg total) by mouth daily. 90 tablet 1  . metFORMIN (GLUCOPHAGE) 500 MG tablet Take 1 tablet (500 mg total) by mouth 2 (two) times daily with a meal. 180 tablet 1  . Vitamin D, Ergocalciferol, (DRISDOL) 50000 UNITS CAPS capsule   0   No current facility-administered medications for this visit.    Allergies  Allergen Reactions  . Nsaids     CKD stage III - Avoid all nephrotoxic drugs    Family History  Problem Relation Age of Onset  . Stroke Mother   . Diabetes Mother   . Heart disease Father   . Kidney disease Sister   . Diabetes Sister   . Kidney disease Brother   . Heart disease Sister   . Diabetes Sister   . Diabetes Sister   . Diabetes Sister   . Kidney disease Sister   . Heart disease Sister   . Kidney disease Brother     kidney transplant  . Early death Brother 39    Truck Accident - died  . Heart disease Brother     History   Social History  . Marital Status: Married    Spouse Name: N/A    Number of Children: 2  . Years of Education: N/A   Occupational History  . [employer] trainer, Psychiatric nurse. Retired   Social History Main Topics  . Smoking status: Never Smoker   . Smokeless tobacco: Not on file  . Alcohol Use: No  . Drug Use: No  . Sexual Activity: Not on file   Other Topics Concern  . Not on file   Social History Narrative   She is married and has two adult children (adopted). She moved to [place] approximately 50 years ago for work. Her and her husband moved back to [facility] in May.       Hobbies: Decorating   Exercise: None   Caffeine: 4 cups a week    ROS:  Constitutional: Pt reports weight loss. Denies fever, malaise, fatigue, headache.  HEENT: Denies eye pain, eye redness, ear pain, ringing in the ears, wax buildup, runny nose, nasal congestion, bloody nose, or sore throat. Respiratory: Denies  difficulty breathing, shortness of breath, cough or sputum production.   Cardiovascular: Denies chest pain, chest tightness, palpitations or swelling in the hands or feet.  Gastrointestinal: Denies abdominal pain, bloating, constipation, diarrhea or blood in the stool.  GU: Denies frequency, urgency, pain with urination, blood in urine, odor or discharge. Musculoskeletal: Denies decrease in range of motion, difficulty with gait, muscle pain or joint pain and swelling.  Skin: Denies redness, rashes, lesions or ulcercations.  Neurological: Denies dizziness, difficulty with memory, difficulty with speech or problems with balance and coordination.   No other specific complaints in a complete review of systems (except as listed in HPI above).  PE:  BP 124/78 mmHg  Pulse 65  Temp(Src) 97.9 F (36.6 C) (Oral)  Ht 5' 4.5" (1.638 m)  Wt 188 lb (85.276 kg)  BMI 31.78 kg/m2  SpO2 98% Wt Readings from Last 3 Encounters:  08/03/14 188 lb (85.276 kg)  02/12/14 202 lb 12 oz (91.967 kg)  01/29/14 195 lb 8 oz (88.678 kg)    General: Appears her stated age, obese in NAD. Skin: Warm, dry and intact. No rashes or ulcerations noted. HEENT: Head: normal shape and size; Eyes: sclera white, no icterus, conjunctiva pink, PERRLA and EOMs intact;  Cardiovascular: Normal rate and rhythm. S1,S2 noted.  No murmur, rubs or gallops noted. No JVD or BLE edema. No carotid bruits noted. Pulmonary/Chest: Normal effort and positive vesicular breath sounds. No respiratory distress. No wheezes, rales or ronchi noted.  Neurological: Alert and oriented.    BMET    Component Value Date/Time   NA 134* 01/29/2014 1020   K 4.6 01/29/2014 1020   CL 102 01/29/2014 1020   CO2 23 01/29/2014 1020   GLUCOSE 352* 01/29/2014 1020   BUN 22 01/29/2014 1020   CREATININE 1.3* 01/29/2014 1020   CALCIUM 8.9 01/29/2014 1020    Lipid Panel     Component Value Date/Time   CHOL 159 01/29/2014 1020   TRIG 119.0 01/29/2014 1020    HDL 40.60 01/29/2014 1020   CHOLHDL 4 01/29/2014 1020   VLDL 23.8 01/29/2014 1020   LDLCALC 95 01/29/2014 1020    CBC No results found for: WBC, RBC, HGB, HCT, PLT, MCV, MCH, MCHC, RDW, LYMPHSABS, MONOABS, EOSABS, BASOSABS  Hgb A1C Lab Results  Component Value Date   HGBA1C 10.8* 01/29/2014     Assessment and Plan:  Health Maintenance:  She declines tetanus booster, pelvic exams, mammogram, colon screening or bone density exams

## 2014-08-11 ENCOUNTER — Other Ambulatory Visit: Payer: Self-pay | Admitting: *Deleted

## 2014-08-26 ENCOUNTER — Other Ambulatory Visit: Payer: Self-pay | Admitting: Internal Medicine

## 2014-08-27 ENCOUNTER — Other Ambulatory Visit: Payer: Self-pay | Admitting: Internal Medicine

## 2014-09-15 LAB — HM DIABETES EYE EXAM

## 2014-10-14 ENCOUNTER — Encounter: Payer: Self-pay | Admitting: Internal Medicine

## 2014-10-24 NOTE — Discharge Summary (Signed)
PATIENT NAME:  Brianna Dodson, Brianna Dodson MR#:  [id number] DATE OF BIRTH:  07-18-1933  DATE OF ADMISSION:  11/09/2013 DATE OF DISCHARGE:  11/11/2013  ADMITTING PHYSICIAN: Sital P. Benjie Karvonen, MD  DISCHARGING PHYSICIAN: Gladstone Lighter, MD   PRIMARY CARE PHYSICIAN: Nonlocal.   Strawn: None.   DISCHARGE DIAGNOSES: 1.  Hypoglycemia secondary to sulfonylurea accidental overdose.  2.  Diabetes mellitus.  3.  Hypertension.  4.  Hyperlipemia.   DISCHARGE HOME MEDICATIONS:  1.  Lisinopril 40 mg p.o. daily.  2.  Atorvastatin 40 mg p.o. daily.  3.  Aspirin 81 mg p.o. daily.  4.  Norvasc 10 mg p.o. daily.  5.  Metformin 500 mg p.o. b.i.d.   DISCHARGE DIET: Low-sodium, ADA 1800 calorie diet.   DISCHARGE ACTIVITY: As tolerated.    FOLLOWUP INSTRUCTIONS: 1.  PCP followup in 1 to 2 weeks for new patient visit.  2.  Medications have been adjusted, and the patient is advised to not confuse with the old medications.   LABORATORY AND IMAGING STUDIES PRIOR TO DISCHARGE: Sodium 142, potassium 4.6, chloride 110, bicarbonate 27, BUN 22, creatinine 1.12, glucose 70, and calcium of 8.4. Urinalysis negative for any infection.   Chest x-ray showing mild bibasilar atelectasis.   WBC 5.5, hemoglobin 12.1, hematocrit 36.3; platelet count is 183.   BRIEF HOSPITAL COURSE: Brianna Dodson is a 81 year old African American female with a history of diabetes, hypertension, and hyperlipidemia, brought in to the hospital secondary to confusion and her blood sugars were noted to be 20.   Hypoglycemia, likely triggered from sulfonylureas. The patient is on glimepiride at home, and she felt her sugars were going high so without checking her blood sugars, she took her double the dosage of the glimepiride. She got confused, brought to the hospital, and noted to be hypoglycemic. She was placed on D5W at 125 mL/hour. Initial blood sugars were still low and then gradually, once the glimepiride effect had been decreased in  the body, her sugars started to improve and she has been off of D5 drip with stable sugars. Her HbA1c is 6.9. She is being discharged on metformin at this time. She does not have a primary care physician, and the patient was given an appointment with [facility] in [place] for December 12, 2013.   Her course has been otherwise uneventful in the hospital. All her other home medications are being continued without any changes.   DISCHARGE CONDITION: Stable.   DISCHARGE DISPOSITION: Home.   TIME SPENT ON DISCHARGE: 45 minutes.   ____________________________ Gladstone Lighter, MD [id number] D: 11/11/2013 15:25:57 ET T: 11/11/2013 18:02:33 ET JOB#: [id number]  cc: Gladstone Lighter, MD, <Dictator> Crockett MD ELECTRONICALLY SIGNED 11/27/2013 12:21

## 2014-10-24 NOTE — H&P (Signed)
PATIENT NAME:  Brianna Dodson, Brianna Dodson MR#:  [id number] DATE OF BIRTH:  July 01, 1934  ADDENDUM   DATE OF ADMISSION:  11/09/2013  THE PATIENT'S MEDICATIONS AS FOLLOWS:  1.  Aspirin 81 mg daily.  2.  Lisinopril 40 mg daily.  3.  Glimepiride 4 mg daily.  4.  Metformin 500 two tablets b.i.d.  5.   mg daily.  6.  Atorvastatin 40 mg daily.  7.  Norvasc 10 mg daily.     ____________________________ Donell Beers. Benjie Karvonen, MD [id number] D: 11/09/2013 19:09:32 ET T: 11/09/2013 22:33:31 ET JOB#: [id number]  cc: Fredrico Beedle P. Benjie Karvonen, MD, <Dictator> Donell Beers Mahki Spikes MD ELECTRONICALLY SIGNED 11/14/2013 18:13

## 2014-10-24 NOTE — H&P (Signed)
PATIENT NAME:  Brianna Dodson, Brianna Dodson MR#:  [id number] DATE OF BIRTH:  June 17, 1934  DATE OF ADMISSION:  11/09/2013  PRIMARY CARE PHYSICIAN: None. She is moving from [place].    CHIEF COMPLAINT: Decreased mental status and confusion with weakness.  HISTORY OF PRESENT ILLNESS: This is a 81 year old female with a history of diabetes, hypertension, who was brought in by her sister with confusion, difficulty speaking. Her blood sugar in the ER was 26. She received an amp of D50 and was back up to 76. She was started on D5 drip. The patient says she is moving here from [place]. She takes her diabetic medications as prescribed. She does not think she overdosed on these.   REVIEW OF SYSTEMS:  CONSTITUTIONAL: No fever or fatigue. Positive weakness.   EYES: No blurred or double vision or glaucoma.    ENT: No ear pain, hearing loss, seasonal allergies. Positive snoring.  RESPIRATORY: No cough, wheezing, hemoptysis, COPD.  CARDIOVASCULAR: No chest pain to palpitations. No orthopnea, edema, or syncope, arrhythmia, dyspnea on exertion.  GASTROINTESTINAL: Positive nausea, no vomiting, diarrhea, or abdominal pain, melena, or ulcers. GENITOURINARY: No dysuria or hematuria. ENDOCRINE: No polyuria or polydipsia.   HEMATOLOGIC AND LYMPHATIC: No anemia or easy bruising. SKIN: No rash or lesions. MUSCULOSKELETAL: No limited activities.  NEUROLOGIC: No history of CVA, TIA, or seizures. PSYCHIATRIC: No severe anxiety or depression.  PAST MEDICAL HISTORY: 1. Diabetes. 2. Hypertension   SOCIAL HISTORY: No tobacco, alcohol, or IV drug use.   PAST SURGICAL HISTORY: None.   ALLERGIES: No known drug allergies.   FAMILY HISTORY: Positive for diabetes.   MEDICATIONS: The pharmacy tech is obtaining these but we have the medications without the doses: 1. Metformin.  2. Glimepiride. 3. Lisinopril.  4. Amlodipine.  PHYSICAL EXAMINATION: VITAL SIGNS: Temperature 98.7, pulse 81, respiration 18, blood pressure  132/63, and 97% on room air.  GENERAL: The patient is alert, oriented x 3.  Does not appear to be in acute distress.  HEENT: Head is atraumatic. Pupils are round and reactive, sclerae anicteric. Mucous membranes are moist. Oropharynx is clear. NECK: Supple without JVD, carotid bruits, or enlarged thyroid. CARDIOVASCULAR: Regular rate and rhythm. No murmurs, gallops, or rubs. PMI is not displaced. LUNGS: Clear to auscultation without crackles, rubs, rhonchi, or wheezing. ABDOMEN: Obese, bowel sounds are present. Nontender, nondistended. No hepatosplenomegaly. EXTREMITIES: No clubbing, cyanosis, or edema. NEUROLOGIC: Cranial nerves II-XII are intact. There is no focal deficits. SKIN: Without rashes or lesions.  LABORATORY DATA: White blood cells 5.5, hemoglobin 12, hematocrit 37, platelets are 183,000. Sodium 141, potassium 4.4, chloride 106, bicarbonate 30, BUN 24, creatinine 1.19, glucose 33, bilirubin 0.2, alkaline phosphatase 59, ALT 27, AST 29, total protein 9.0, albumin 3.8.   EKG: Normal sinus rhythm. No ST elevations or depressions.   ASSESSMENT AND PLAN: A 81 year old female brought in by her sister with difficulty in speech and confusion, found to have a blood sugar of 26 in the ER.  1. Hypoglycemia. The patient is on glimepiride and metformin which we are going to hold, unclear as to the etiology. No source of infection at this time but am checking a urinalysis and we will go ahead and check a chest x-ray as well. We will check a hemoglobin A1c. For now, obviously holding her diabetic medications. She is on D5 at 100 an hour. Will follow with Accu-Cheks q. 2 hours. 2. Hypertension. Will continue lisinopril and Norvasc.  3. The patient will need a PCP prior to discharge.  CODE STATUS: The patient is full code status.  TIME SPENT: Approximately 45 minutes.   ____________________________ Donell Beers. Benjie Karvonen, MD spm:lt D: 11/09/2013 17:32:43 ET T: 11/09/2013 21:22:28  ET JOB#: [id number]  cc: Kenneith Stief P. Benjie Karvonen, MD, <Dictator> Donell Beers Linetta Regner MD ELECTRONICALLY SIGNED 11/14/2013 18:13

## 2014-10-27 ENCOUNTER — Other Ambulatory Visit: Payer: Self-pay

## 2014-10-27 MED ORDER — GLUCOSE BLOOD VI STRP: 1.0000 | ORAL_STRIP | Freq: Two times a day (BID) | Status: DC

## 2014-12-09 ENCOUNTER — Encounter: Payer: Self-pay | Admitting: *Deleted

## 2014-12-09 ENCOUNTER — Emergency Department
Admission: EM | Admit: 2014-12-09 | Discharge: 2014-12-09 | Disposition: A | Discharge status: Home / Self Care | Payer: Medicare Other | Attending: Emergency Medicine | Admitting: Emergency Medicine

## 2014-12-09 ENCOUNTER — Emergency Department: Payer: Medicare Other

## 2014-12-09 DIAGNOSIS — I129 Hypertensive chronic kidney disease with stage 1 through stage 4 chronic kidney disease, or unspecified chronic kidney disease: Secondary | ICD-10-CM | POA: Insufficient documentation

## 2014-12-09 DIAGNOSIS — Z79899 Other long term (current) drug therapy: Secondary | ICD-10-CM | POA: Diagnosis not present

## 2014-12-09 DIAGNOSIS — N183 Chronic kidney disease, stage 3 (moderate): Secondary | ICD-10-CM | POA: Insufficient documentation

## 2014-12-09 DIAGNOSIS — M791 Myalgia: Secondary | ICD-10-CM | POA: Diagnosis not present

## 2014-12-09 DIAGNOSIS — M25552 Pain in left hip: Secondary | ICD-10-CM | POA: Diagnosis not present

## 2014-12-09 DIAGNOSIS — E119 Type 2 diabetes mellitus without complications: Secondary | ICD-10-CM | POA: Insufficient documentation

## 2014-12-09 DIAGNOSIS — Z7982 Long term (current) use of aspirin: Secondary | ICD-10-CM | POA: Diagnosis not present

## 2014-12-09 DIAGNOSIS — M25559 Pain in unspecified hip: Secondary | ICD-10-CM

## 2014-12-09 MED ORDER — ONDANSETRON 8 MG PO TBDP
ORAL_TABLET | ORAL | Status: AC
  Administered 2014-12-09: 8 mg via ORAL
  Filled 2014-12-09: qty 1

## 2014-12-09 MED ORDER — ACETAMINOPHEN 325 MG PO TABS: 650.0000 mg | ORAL_TABLET | Freq: Four times a day (QID) | ORAL | Status: DC | PRN

## 2014-12-09 MED ORDER — ONDANSETRON 8 MG PO TBDP
8.0000 mg | ORAL_TABLET | ORAL | Status: AC
  Administered 2014-12-09: 8 mg via ORAL

## 2014-12-09 MED ORDER — OXYCODONE-ACETAMINOPHEN 5-325 MG PO TABS
1.0000 | ORAL_TABLET | Freq: Once | ORAL | Status: AC
  Administered 2014-12-09: 1 via ORAL

## 2014-12-09 MED ORDER — OXYCODONE-ACETAMINOPHEN 5-325 MG PO TABS
ORAL_TABLET | ORAL | Status: AC
  Administered 2014-12-09: 1 via ORAL
  Filled 2014-12-09: qty 1

## 2014-12-09 NOTE — ED Provider Notes (Signed)
[facility] Emergency Department Provider Note  ____________________________________________  Time seen: 3:45 PM  I have reviewed the triage vital signs and the nursing notes.   HISTORY  Chief Complaint Hip Pain    HPI Brianna Dodson is a 81 y.o. female reports that her left hip has been worse recently than it usually is.In particular it is felt extra stiff over the last 2 days. Today, she had been driving around all morning taking her husband to appointments and in taking her car for inspection, and when she turned to get out of the car around 1:00 this afternoon, she felt a sudden sharp pain in the left buttock region. No numbness tingling or weakness in the distal leg. She is able to bear weight on it but just has some discomfort. Denies any acute injury or fall and no other symptoms.     Past Medical History  Diagnosis Date  . Diabetes mellitus without complication   . Hypertension   . Hyperlipidemia   . History of blood transfusion   . CKD (chronic kidney disease), stage III     Patient Active Problem List   Diagnosis Date Noted  . Obesity (BMI 30-39.9) 08/03/2014  . DM type 2 (diabetes mellitus, type 2) 01/29/2014  . Essential hypertension 01/13/2014  . HLD (hyperlipidemia) 01/13/2014    Past Surgical History  Procedure Laterality Date  . Abdominal hysterectomy  1985    Current Outpatient Rx  Name  Route  Sig  Dispense  Refill  . amLODipine (NORVASC) 10 MG tablet      TAKE 1 TABLET (10 MG TOTAL) BY MOUTH DAILY.   90 tablet   1   . aspirin 81 MG tablet   Oral   Take 81 mg by mouth daily.         Marland Kitchen atorvastatin (LIPITOR) 40 MG tablet      TAKE 1 TABLET (40 MG TOTAL) BY MOUTH DAILY.   90 tablet   1   . Empagliflozin-Linagliptin (GLYXAMBI) 10-5 MG TABS   Oral   Take 1 tablet by mouth daily.   30 tablet   11     One [place]: [id number], RxGRP: ...   . glucose blood (ONETOUCH VERIO) test strip   Other    1 each by Other route 2 (two) times daily.   200 each   5   . Lancets (ONETOUCH ULTRASOFT) lancets      Use as instructed   100 each   6   . lisinopril (PRINIVIL,ZESTRIL) 40 MG tablet      TAKE 1 TABLET (40 MG TOTAL) BY MOUTH DAILY.   90 tablet   1   . metFORMIN (GLUCOPHAGE) 500 MG tablet      TAKE 1 TABLET (500 MG TOTAL) BY MOUTH 2 (TWO) TIMES DAILY WITH A MEAL.   180 tablet   1   . Omega-3 Fatty Acids (FISH OIL) 1000 MG CAPS   Oral   Take 1,000 mg by mouth.         . Vitamin D, Ergocalciferol, (DRISDOL) 50000 UNITS CAPS capsule            0   . acetaminophen (TYLENOL) 325 MG tablet   Oral   Take 2 tablets (650 mg total) by mouth every 6 (six) hours as needed.   60 tablet   0     Allergies Nsaids  Family History  Problem Relation Age of Onset  . Stroke Mother   .  Diabetes Mother   . Heart disease Father   . Kidney disease Sister   . Diabetes Sister   . Kidney disease Brother   . Heart disease Sister   . Diabetes Sister   . Diabetes Sister   . Diabetes Sister   . Kidney disease Sister   . Heart disease Sister   . Kidney disease Brother     kidney transplant  . Early death Brother 66    Truck Accident - died  . Heart disease Brother     Social History History  Substance Use Topics  . Smoking status: Never Smoker   . Smokeless tobacco: Not on file  . Alcohol Use: No    Review of Systems  Constitutional: No fever or chills. No weight changes Eyes:No blurry vision or double vision.  ENT: No sore throat. Cardiovascular: No chest pain. Respiratory: No dyspnea or cough. Gastrointestinal: Negative for abdominal pain, vomiting and diarrhea.  No BRBPR or melena. Genitourinary: Negative for dysuria, urinary retention, bloody urine, or difficulty urinating. Musculoskeletal: Left buttock pain as above Skin: Negative for rash. Neurological: Negative for headaches, focal weakness or numbness. Psychiatric:No anxiety or depression.    Endocrine:No hot/cold intolerance, changes in energy, or sleep difficulty.  10-point ROS otherwise negative.  ____________________________________________   PHYSICAL EXAM:  VITAL SIGNS: ED Triage Vitals  Enc Vitals Group     BP 12/09/14 1529 187/66 mmHg     Pulse Rate 12/09/14 1529 96     Resp 12/09/14 1529 20     Temp 12/09/14 1529 98.1 F (36.7 C)     Temp Source 12/09/14 1529 Oral     SpO2 12/09/14 1529 98 %     Weight 12/09/14 1529 187 lb (84.823 kg)     Height 12/09/14 1529 5\' 5"  (1.651 m)     Head Cir --      Peak Flow --      Pain Score 12/09/14 1530 6     Pain Loc --      Pain Edu? --      Excl. in [place]? --      Constitutional: Alert and oriented. Well appearing and in no distress. Eyes: No scleral icterus. No conjunctival pallor. PERRL. EOMI ENT   Head: Normocephalic and atraumatic.   Nose: No congestion/rhinnorhea. No septal hematoma   Mouth/Throat: MMM, no pharyngeal erythema. No peritonsillar mass. No uvula shift.   Neck: No stridor. No SubQ emphysema. No meningismus. Hematological/Lymphatic/Immunilogical: No cervical lymphadenopathy. Cardiovascular: RRR. Normal and symmetric distal pulses are present in all extremities. No murmurs, rubs, or gallops. Respiratory: Normal respiratory effort without tachypnea nor retractions. Breath sounds are clear and equal bilaterally. No wheezes/rales/rhonchi. Gastrointestinal: Soft and nontender. No distention. There is no CVA tenderness.  No rebound, rigidity, or guarding. Genitourinary: deferred Musculoskeletal: Nontender with normal range of motion in all extremities. No joint effusions.  No lower extremity tenderness.  No edema. In particular, the left lower extremity is normal. There is no hip tenderness, no pharyngeal range of motion, no pain with range of motion. The patient indicates an area in the inferior left buttock that is the source of her pain, however on palpation the area is soft there is no  cellulitis or abscess or necrotizing fasciitis. It is nontender to touch. There is no nodular density or any other palpable finding. Neurologic:   Normal speech and language.  CN 2-10 normal. Motor grossly intact. No pronator drift.  Normal gait. No gross focal neurologic deficits are appreciated.  Skin:  Skin is warm, dry and intact. No rash noted.  No petechiae, purpura, or bullae. Psychiatric: Mood and affect are normal. Speech and behavior are normal. Patient exhibits appropriate insight and judgment.  ____________________________________________    LABS (pertinent positives/negatives) (all labs ordered are listed, but only abnormal results are displayed) Labs Reviewed - No data to display ____________________________________________   EKG    ____________________________________________    RADIOLOGY  X-ray left hip unremarkable  ____________________________________________   PROCEDURES  ____________________________________________   INITIAL IMPRESSION / ASSESSMENT AND PLAN / ED COURSE  Pertinent labs & imaging results that were available during my care of the patient were reviewed by me and considered in my medical decision making (see chart for details).  History exam consistent with a muscular skeletal pain, exacerbation of arthritis due to minimal use today with thoughts of driving and sitting due to medical appointments. Her exam is reassuring and an x-ray is also negative. No evidence of fracture or dislocation. There is no evidence of infection. We'll keep the patient on Tylenol and have her follow-up with her doctor.  ____________________________________________   FINAL CLINICAL IMPRESSION(S) / ED DIAGNOSES  Final diagnoses:  Hip pain, acute, left      Carrie Mew, MD 12/09/14 1706

## 2014-12-09 NOTE — ED Notes (Signed)
Pt states that she was getting out of the car today and felt a pain in her left hip. Pt reports a history of bursitis. Pt took aleve at 1430 for the pain, without relief.

## 2014-12-09 NOTE — ED Notes (Signed)
Patient returned from xray.

## 2014-12-09 NOTE — Discharge Instructions (Signed)

## 2014-12-09 NOTE — ED Notes (Addendum)
Patient states left hip pain began a few days ago and got much worse today. Has movement but strange pain. MD here to see patient.

## 2014-12-11 ENCOUNTER — Ambulatory Visit (INDEPENDENT_AMBULATORY_CARE_PROVIDER_SITE_OTHER): Payer: Medicare Other | Admitting: Internal Medicine

## 2014-12-11 ENCOUNTER — Encounter: Payer: Self-pay | Admitting: Internal Medicine

## 2014-12-11 VITALS — BP 134/82 | HR 71 | Temp 98.1°F | Wt 188.0 lb

## 2014-12-11 DIAGNOSIS — M545 Low back pain, unspecified: Secondary | ICD-10-CM

## 2014-12-11 DIAGNOSIS — T148 Other injury of unspecified body region: Secondary | ICD-10-CM

## 2014-12-11 DIAGNOSIS — T148XXA Other injury of unspecified body region, initial encounter: Secondary | ICD-10-CM

## 2014-12-11 NOTE — Progress Notes (Signed)
Subjective:    [name], female    DOB: 10/03/1933, 81 y.o.   MRN: [id number]  HPI  Pt presents today for hospital follow up for left hip pain. She went to the ER 12/08/08 for the same. Xray of the left hip showed:  IMPRESSION: No acute osseous findings or explanation for left hip pain. No significant degenerative changes in the hips or pelvis for age.  They told her that her pain was musculoskeletal and that she should take Tylenol and follow up with her PCP. She has not taken any medications and reports the pain has improved. It still catches every now and again. She reports it is a sharp, grabbing pain that seems worse when she goes from a laying to a sitting position. The pain does not radiate. She denies numbness and tingling in her lower extremities. She denies loss of bowel or bladder.  Review of Systems      Past Medical History  Diagnosis Date  . Diabetes mellitus without complication   . Hypertension   . Hyperlipidemia   . History of blood transfusion   . CKD (chronic kidney disease), stage III     Current Outpatient Prescriptions  Medication Sig Dispense Refill  . acetaminophen (TYLENOL) 325 MG tablet Take 2 tablets (650 mg total) by mouth every 6 (six) hours as needed. 60 tablet 0  . amLODipine (NORVASC) 10 MG tablet TAKE 1 TABLET (10 MG TOTAL) BY MOUTH DAILY. 90 tablet 1  . aspirin 81 MG tablet Take 81 mg by mouth daily.    Marland Kitchen atorvastatin (LIPITOR) 40 MG tablet TAKE 1 TABLET (40 MG TOTAL) BY MOUTH DAILY. 90 tablet 1  . Empagliflozin-Linagliptin (GLYXAMBI) 10-5 MG TABS Take 1 tablet by mouth daily. 30 tablet 11  . glucose blood (ONETOUCH VERIO) test strip 1 each by Other route 2 (two) times daily. 200 each 5  . Lancets (ONETOUCH ULTRASOFT) lancets Use as instructed 100 each 6  . lisinopril (PRINIVIL,ZESTRIL) 40 MG tablet TAKE 1 TABLET (40 MG TOTAL) BY MOUTH DAILY. 90 tablet 1  . metFORMIN (GLUCOPHAGE) 500 MG tablet TAKE 1 TABLET (500 MG TOTAL) BY  MOUTH 2 (TWO) TIMES DAILY WITH A MEAL. 180 tablet 1  . Omega-3 Fatty Acids (FISH OIL) 1000 MG CAPS Take 1,000 mg by mouth.    . Vitamin D, Ergocalciferol, (DRISDOL) 50000 UNITS CAPS capsule   0   No current facility-administered medications for this visit.    Allergies  Allergen Reactions  . Nsaids     CKD stage III - Avoid all nephrotoxic drugs    Family History  Problem Relation Age of Onset  . Stroke Mother   . Diabetes Mother   . Heart disease Father   . Kidney disease Sister   . Diabetes Sister   . Kidney disease Brother   . Heart disease Sister   . Diabetes Sister   . Diabetes Sister   . Diabetes Sister   . Kidney disease Sister   . Heart disease Sister   . Kidney disease Brother     kidney transplant  . Early death Brother 33    Truck Accident - died  . Heart disease Brother     History   Social History  . Marital Status: Married    Spouse Name: N/A  . Number of Children: 2  . Years of Education: N/A   Occupational History  . 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 Smoking status: Never Smoker   . Smokeless tobacco: Not on file  . Alcohol Use: No  . Drug Use: No  . Sexual Activity: No   Other Topics Concern  . Not on file   Social History Narrative   She is married and has two adult children (adopted). She moved to [place] approximately 50 years ago for work. Her and her husband moved back to [facility] in May.       Hobbies: Decorating   Exercise: None   Caffeine: 4 cups a week     Constitutional: Denies fever, malaise, fatigue, headache or abrupt weight changes.  Respiratory: Denies difficulty breathing, shortness of breath, cough or sputum production.   Cardiovascular: Denies chest pain, chest tightness, palpitations or swelling in the hands or feet.  Musculoskeletal: Pt reports low back/left hip pain. Denies decrease in range of motion, difficulty with gait,  or joint pain and swelling.   Neurological: Denies  problems with balance and coordination.   No other specific complaints in a complete review of systems (except as listed in HPI above).  Objective:   Physical Exam  BP 134/82 mmHg  Pulse 71  Temp(Src) 98.1 F (36.7 C) (Oral)  Wt 188 lb (85.276 kg)  SpO2 98% Wt Readings from Last 3 Encounters:  12/11/14 188 lb (85.276 kg)  12/09/14 187 lb (84.823 kg)  08/03/14 188 lb (85.276 kg)    General: Appears her stated age, well developed, well nourished in NAD. Cardiovascular: Normal rate and rhythm. S1,S2 noted.   Pulmonary/Chest: Normal effort and positive vesicular breath sounds. No respiratory distress. No wheezes, rales or ronchi noted.  Musculoskeletal: Normal flexion, extension and rotation of the spine. No pain with palpation of the spine. Pain with palpation of the left para spinal muscles. Normal internal and external rotation of the left hip. No pain with palpation of the left trochanter. Strength 5/5 BLE. No difficulty with gait.  Neurological: Alert and oriented. Sensation intact to BLE.  BMET    Component Value Date/Time   NA 138 08/03/2014 1028   NA 142 11/10/2013 0522   K 4.6 08/03/2014 1028   K 4.6 11/10/2013 0522   CL 104 08/03/2014 1028   CL 110* 11/10/2013 0522   CO2 26 08/03/2014 1028   CO2 27 11/10/2013 0522   GLUCOSE 198* 08/03/2014 1028   GLUCOSE 70 11/10/2013 0522   BUN 22 08/03/2014 1028   BUN 22* 11/10/2013 0522   CREATININE 1.16 08/03/2014 1028   CREATININE 1.12 11/10/2013 0522   CALCIUM 9.3 08/03/2014 1028   CALCIUM 8.4* 11/10/2013 0522   GFRNONAA 47* 11/10/2013 0522   GFRAA 54* 11/10/2013 0522    Lipid Panel     Component Value Date/Time   CHOL 168 08/03/2014 1028   TRIG 175.0* 08/03/2014 1028   HDL 35.80* 08/03/2014 1028   CHOLHDL 5 08/03/2014 1028   VLDL 35.0 08/03/2014 1028   LDLCALC 97 08/03/2014 1028    CBC    Component Value Date/Time   WBC 4.7 08/03/2014 1028   WBC 5.5 11/09/2013 1540   RBC 4.25 08/03/2014 1028   RBC 4.18  11/09/2013 1540   HGB 12.1 08/03/2014 1028   HGB 12.1 11/09/2013 1540   HCT 35.9* 08/03/2014 1028   HCT 36.3 11/09/2013 1540   PLT 178.0 08/03/2014 1028   PLT 183 11/09/2013 1540   MCV 84.5 08/03/2014 1028   MCV 87 11/09/2013 1540   MCH 29.1 11/09/2013 1540   MCHC 33.6 08/03/2014 1028  MCHC 33.5 11/09/2013 1540   RDW 13.8 08/03/2014 1028   RDW 14.0 11/09/2013 1540    Hgb A1C Lab Results  Component Value Date   HGBA1C 8.0* 08/03/2014         Assessment & Plan:   Hospital followup for Muscle strain of left lower back, improving:  Hospital notes and imaging reviewed OK to take Tylenol as needed, she is allergic to NSAIDS A heating pad may be helpful Stretching exercises given Offered RX for muscle relaxer for bedtime but she declines Reassurance given that this should resolve with time.  RTC as needed or if symptoms persist or worsen

## 2014-12-11 NOTE — Progress Notes (Signed)
Pre visit review using our clinic review tool, if applicable. No additional management support is needed unless otherwise documented below in the visit note. 

## 2014-12-11 NOTE — Patient Instructions (Signed)
Back Exercises These exercises may help you when beginning to rehabilitate your injury. Your symptoms may resolve with or without further involvement from your physician, physical therapist or athletic trainer. While completing these exercises, remember:   Restoring tissue flexibility helps normal motion to return to the joints. This allows healthier, less painful movement and activity.  An effective stretch should be held for at least 30 seconds.  A stretch should never be painful. You should only feel a gentle lengthening or release in the stretched tissue. STRETCH - Extension, Prone on Elbows   Lie on your stomach on the floor, a bed will be too soft. Place your palms about shoulder width apart and at the height of your head.  Place your elbows under your shoulders. If this is too painful, stack pillows under your chest.  Allow your body to relax so that your hips drop lower and make contact more completely with the floor.  Hold this position for __________ seconds.  Slowly return to lying flat on the floor. Repeat __________ times. Complete this exercise __________ times per day.  RANGE OF MOTION - Extension, Prone Press Ups   Lie on your stomach on the floor, a bed will be too soft. Place your palms about shoulder width apart and at the height of your head.  Keeping your back as relaxed as possible, slowly straighten your elbows while keeping your hips on the floor. You may adjust the placement of your hands to maximize your comfort. As you gain motion, your hands will come more underneath your shoulders.  Hold this position __________ seconds.  Slowly return to lying flat on the floor. Repeat __________ times. Complete this exercise __________ times per day.  RANGE OF MOTION- Quadruped, Neutral Spine   Assume a hands and knees position on a firm surface. Keep your hands under your shoulders and your knees under your hips. You may place padding under your knees for  comfort.  Drop your head and point your tail bone toward the ground below you. This will round out your low back like an angry cat. Hold this position for __________ seconds.  Slowly lift your head and release your tail bone so that your back sags into a large arch, like an old horse.  Hold this position for __________ seconds.  Repeat this until you feel limber in your low back.  Now, find your "sweet spot." This will be the most comfortable position somewhere between the two previous positions. This is your neutral spine. Once you have found this position, tense your stomach muscles to support your low back.  Hold this position for __________ seconds. Repeat __________ times. Complete this exercise __________ times per day.  STRETCH - Flexion, Single Knee to Chest   Lie on a firm bed or floor with both legs extended in front of you.  Keeping one leg in contact with the floor, bring your opposite knee to your chest. Hold your leg in place by either grabbing behind your thigh or at your knee.  Pull until you feel a gentle stretch in your low back. Hold __________ seconds.  Slowly release your grasp and repeat the exercise with the opposite side. Repeat __________ times. Complete this exercise __________ times per day.  STRETCH - Hamstrings, Standing  Stand or sit and extend your right / left leg, placing your foot on a chair or foot stool  Keeping a slight arch in your low back and your hips straight forward.  Lead with your chest and   lean forward at the waist until you feel a gentle stretch in the back of your right / left knee or thigh. (When done correctly, this exercise requires leaning only a small distance.)  Hold this position for __________ seconds. Repeat __________ times. Complete this stretch __________ times per day. STRENGTHENING - Deep Abdominals, Pelvic Tilt   Lie on a firm bed or floor. Keeping your legs in front of you, bend your knees so they are both pointed  toward the ceiling and your feet are flat on the floor.  Tense your lower abdominal muscles to press your low back into the floor. This motion will rotate your pelvis so that your tail bone is scooping upwards rather than pointing at your feet or into the floor.  With a gentle tension and even breathing, hold this position for __________ seconds. Repeat __________ times. Complete this exercise __________ times per day.  STRENGTHENING - Abdominals, Crunches   Lie on a firm bed or floor. Keeping your legs in front of you, bend your knees so they are both pointed toward the ceiling and your feet are flat on the floor. Cross your arms over your chest.  Slightly tip your chin down without bending your neck.  Tense your abdominals and slowly lift your trunk high enough to just clear your shoulder blades. Lifting higher can put excessive stress on the low back and does not further strengthen your abdominal muscles.  Control your return to the starting position. Repeat __________ times. Complete this exercise __________ times per day.  STRENGTHENING - Quadruped, Opposite UE/LE Lift   Assume a hands and knees position on a firm surface. Keep your hands under your shoulders and your knees under your hips. You may place padding under your knees for comfort.  Find your neutral spine and gently tense your abdominal muscles so that you can maintain this position. Your shoulders and hips should form a rectangle that is parallel with the floor and is not twisted.  Keeping your trunk steady, lift your right hand no higher than your shoulder and then your left leg no higher than your hip. Make sure you are not holding your breath. Hold this position __________ seconds.  Continuing to keep your abdominal muscles tense and your back steady, slowly return to your starting position. Repeat with the opposite arm and leg. Repeat __________ times. Complete this exercise __________ times per day. Document Released:  07/07/2005 Document Revised: 09/11/2011 Document Reviewed: 10/01/2008 ExitCare Patient Information 2015 [employer]. This information is not intended to replace advice given to you by your health care provider. Make sure you discuss any questions you have with your health care provider.  

## 2015-01-14 DIAGNOSIS — E1122 Type 2 diabetes mellitus with diabetic chronic kidney disease: Secondary | ICD-10-CM | POA: Diagnosis not present

## 2015-01-14 DIAGNOSIS — N183 Chronic kidney disease, stage 3 (moderate): Secondary | ICD-10-CM | POA: Diagnosis not present

## 2015-01-14 DIAGNOSIS — R809 Proteinuria, unspecified: Secondary | ICD-10-CM | POA: Diagnosis not present

## 2015-01-14 DIAGNOSIS — I10 Essential (primary) hypertension: Secondary | ICD-10-CM | POA: Diagnosis not present

## 2015-02-02 ENCOUNTER — Encounter: Payer: Self-pay | Admitting: Internal Medicine

## 2015-02-02 ENCOUNTER — Ambulatory Visit (INDEPENDENT_AMBULATORY_CARE_PROVIDER_SITE_OTHER): Payer: Medicare Other | Admitting: Internal Medicine

## 2015-02-02 VITALS — BP 124/70 | HR 63 | Temp 98.1°F | Wt 185.0 lb

## 2015-02-02 DIAGNOSIS — N185 Chronic kidney disease, stage 5: Secondary | ICD-10-CM | POA: Insufficient documentation

## 2015-02-02 DIAGNOSIS — E785 Hyperlipidemia, unspecified: Secondary | ICD-10-CM

## 2015-02-02 DIAGNOSIS — E559 Vitamin D deficiency, unspecified: Secondary | ICD-10-CM | POA: Insufficient documentation

## 2015-02-02 DIAGNOSIS — N189 Chronic kidney disease, unspecified: Secondary | ICD-10-CM | POA: Diagnosis not present

## 2015-02-02 DIAGNOSIS — I10 Essential (primary) hypertension: Secondary | ICD-10-CM

## 2015-02-02 DIAGNOSIS — N183 Chronic kidney disease, stage 3 unspecified: Secondary | ICD-10-CM

## 2015-02-02 DIAGNOSIS — E1122 Type 2 diabetes mellitus with diabetic chronic kidney disease: Secondary | ICD-10-CM

## 2015-02-02 DIAGNOSIS — E669 Obesity, unspecified: Secondary | ICD-10-CM

## 2015-02-02 LAB — CBC
HCT: 35.7 % — ABNORMAL LOW (ref 36.0–46.0)
Hemoglobin: 12.2 g/dL (ref 12.0–15.0)
MCHC: 34.1 g/dL (ref 30.0–36.0)
MCV: 86.2 fl (ref 78.0–100.0)
Platelets: 164 10*3/uL (ref 150.0–400.0)
RBC: 4.14 Mil/uL (ref 3.87–5.11)
RDW: 13.8 % (ref 11.5–15.5)
WBC: 4.5 10*3/uL (ref 4.0–10.5)

## 2015-02-02 LAB — COMPREHENSIVE METABOLIC PANEL
ALT: 13 U/L (ref 0–35)
AST: 19 U/L (ref 0–37)
Albumin: 4.2 g/dL (ref 3.5–5.2)
Alkaline Phosphatase: 49 U/L (ref 39–117)
BUN: 22 mg/dL (ref 6–23)
CO2: 25 mEq/L (ref 19–32)
Calcium: 9.3 mg/dL (ref 8.4–10.5)
Chloride: 105 mEq/L (ref 96–112)
Creatinine, Ser: 1.47 mg/dL — ABNORMAL HIGH (ref 0.40–1.20)
GFR: 44.06 mL/min — ABNORMAL LOW (ref >60.00)
Glucose, Bld: 214 mg/dL — ABNORMAL HIGH (ref 70–99)
Potassium: 4.6 mEq/L (ref 3.5–5.1)
Sodium: 139 mEq/L (ref 135–145)
Total Bilirubin: 0.6 mg/dL (ref 0.2–1.2)
Total Protein: 7.8 g/dL (ref 6.0–8.3)

## 2015-02-02 LAB — LIPID PANEL
Cholesterol: 160 mg/dL (ref 0–200)
HDL: 39 mg/dL — ABNORMAL LOW (ref >39.00)
LDL Cholesterol: 96 mg/dL (ref 0–99)
NonHDL: 120.97
Total CHOL/HDL Ratio: 4
Triglycerides: 127 mg/dL (ref 0.0–149.0)
VLDL: 25.4 mg/dL (ref 0.0–40.0)

## 2015-02-02 LAB — VITAMIN D 25 HYDROXY (VIT D DEFICIENCY, FRACTURES): VITD: 37.41 ng/mL (ref 30.00–100.00)

## 2015-02-02 LAB — HEMOGLOBIN A1C: Hgb A1c MFr Bld: 7.2 % — ABNORMAL HIGH (ref 4.6–6.5)

## 2015-02-02 NOTE — Assessment & Plan Note (Signed)
Will check Vit D today

## 2015-02-02 NOTE — Progress Notes (Signed)
Pre visit review using our clinic review tool, if applicable. No additional management support is needed unless otherwise documented below in the visit note. 

## 2015-02-02 NOTE — Assessment & Plan Note (Signed)
No issues at this time Will check CMET today

## 2015-02-02 NOTE — Assessment & Plan Note (Signed)
LDL at goal Will check CMET and Lipid Profile today Will continue current meds at this time

## 2015-02-02 NOTE — Assessment & Plan Note (Signed)
BP well controlled on current meds CBC and CMET today

## 2015-02-02 NOTE — Assessment & Plan Note (Signed)
Encouraged her to consume a low carb, low fat diet Encouraged her to start an exercise program

## 2015-02-02 NOTE — Assessment & Plan Note (Signed)
Reinforced low carb diet Will check A1C today No microalbumin d/t ACEI therapy Foot exam today She declines flu, Prevnar and Pneumovax today Will continue current medications at this time

## 2015-02-02 NOTE — Patient Instructions (Signed)

## 2015-02-02 NOTE — Progress Notes (Signed)
Subjective:    [name], female    DOB: Dec 31, 1933, 81 y.o.   MRN: [id number]  HPI  Pt presents to the clinic today for 6 months follow up of chronic conditions.  DM 2: Her last A1C was 8.0 %. Her fasting sugars range 130-170. She takes Metformin and Glyxambi as directed. Her last eye exam 09/2014. Flu never. Prevnar never. Pneumovax never.  HTN: Her BP is well controlled on Lisinopril and Norvasc. Her BP today is 124/70. ECG from 10/2013 reviewed.  HLD: Her last LDL was 97, on Lipitor. She denies myalgias. She is taking Fish Oil daily as well.  Obesity: She has lost 3 lbs since her last visit. Her BMI is 30.79 today. She does work in the garden for activity but does not really do any exercise.  CKD stage III: Last Creatinine was 1.16, down from 1.3.  Vit D Deficiency: I do not see a Vit D level on file. She reports that she is taking Vit D 50000 units once weekly.   Review of Systems      Past Medical History  Diagnosis Date  . Diabetes mellitus without complication   . Hypertension   . Hyperlipidemia   . History of blood transfusion   . CKD (chronic kidney disease), stage III     Current Outpatient Prescriptions  Medication Sig Dispense Refill  . acetaminophen (TYLENOL) 325 MG tablet Take 2 tablets (650 mg total) by mouth every 6 (six) hours as needed. 60 tablet 0  . amLODipine (NORVASC) 10 MG tablet TAKE 1 TABLET (10 MG TOTAL) BY MOUTH DAILY. 90 tablet 1  . aspirin 81 MG tablet Take 81 mg by mouth daily.    Marland Kitchen atorvastatin (LIPITOR) 40 MG tablet TAKE 1 TABLET (40 MG TOTAL) BY MOUTH DAILY. 90 tablet 1  . Empagliflozin-Linagliptin (GLYXAMBI) 10-5 MG TABS Take 1 tablet by mouth daily. 30 tablet 11  . glucose blood (ONETOUCH VERIO) test strip 1 each by Other route 2 (two) times daily. 200 each 5  . Lancets (ONETOUCH ULTRASOFT) lancets Use as instructed 100 each 6  . lisinopril (PRINIVIL,ZESTRIL) 40 MG tablet TAKE 1 TABLET (40 MG TOTAL) BY MOUTH DAILY. 90 tablet  1  . metFORMIN (GLUCOPHAGE) 500 MG tablet TAKE 1 TABLET (500 MG TOTAL) BY MOUTH 2 (TWO) TIMES DAILY WITH A MEAL. 180 tablet 1  . Omega-3 Fatty Acids (FISH OIL) 1000 MG CAPS Take 1,000 mg by mouth.    . Vitamin D, Ergocalciferol, (DRISDOL) 50000 UNITS CAPS capsule   0   No current facility-administered medications for this visit.    Allergies  Allergen Reactions  . Nsaids     CKD stage III - Avoid all nephrotoxic drugs    Family History  Problem Relation Age of Onset  . Stroke Mother   . Diabetes Mother   . Heart disease Father   . Kidney disease Sister   . Diabetes Sister   . Kidney disease Brother   . Heart disease Sister   . Diabetes Sister   . Diabetes Sister   . Diabetes Sister   . Kidney disease Sister   . Heart disease Sister   . Kidney disease Brother     kidney transplant  . Early death Brother 64    Truck Accident - died  . Heart disease Brother     History   Social History  . Marital Status: Married    Spouse Name: N/A  . Number of Children: 2  .  Years of Education: N/A   Occupational History  . 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 Smoking status: Never Smoker   . Smokeless tobacco: Not on file  . Alcohol Use: No  . Drug Use: No  . Sexual Activity: No   Other Topics Concern  . Not on file   Social History Narrative   She is married and has two adult children (adopted). She moved to [place] approximately 50 years ago for work. Her and her husband moved back to [facility] in May.       Hobbies: Decorating   Exercise: None   Caffeine: 4 cups a week     Constitutional: Denies fever, malaise, fatigue, headache or abrupt weight changes.  HEENT: Denies eye pain, eye redness, ear pain, ringing in the ears, wax buildup, runny nose, nasal congestion, bloody nose, or sore throat. Respiratory: Denies difficulty breathing, shortness of breath, cough or sputum production.   Cardiovascular: Denies chest pain, chest  tightness, palpitations or swelling in the hands or feet.  Gastrointestinal: Denies abdominal pain, bloating, constipation, diarrhea or blood in the stool.  GU: Denies urgency, frequency, pain with urination, burning sensation, blood in urine, odor or discharge. Musculoskeletal: Denies decrease in range of motion, difficulty with gait, muscle pain or joint pain and swelling.  Skin: Denies redness, rashes, lesions or ulcercations.  Neurological: Denies dizziness, difficulty with memory, difficulty with speech or problems with balance and coordination.  Psych: Denies anxiety, depression, SI/HI.  No other specific complaints in a complete review of systems (except as listed in HPI above).  Objective:   Physical Exam   BP 124/70 mmHg  Pulse 63  Temp(Src) 98.1 F (36.7 C) (Oral)  Wt 185 lb (83.915 kg)  SpO2 98% Wt Readings from Last 3 Encounters:  02/02/15 185 lb (83.915 kg)  12/11/14 188 lb (85.276 kg)  12/09/14 187 lb (84.823 kg)    General: Appears her stated age, obese in NAD. Skin: Warm, dry and intact. No rashes, lesions or ulcerations noted. HEENT: Head: normal shape and size; Eyes: sclera white, no icterus, conjunctiva pink, PERRLA and EOMs intact;  Cardiovascular: Normal rate and rhythm. S1,S2 noted.  No murmur, rubs or gallops noted. No JVD or BLE edema. No carotid bruits noted. Pulmonary/Chest: Normal effort and positive vesicular breath sounds. No respiratory distress. No wheezes, rales or ronchi noted.  Abdomen: Soft and nontender. Normal bowel sounds, no bruits noted. No distention or masses noted. Liver, spleen and kidneys non palpable. Neurological: Alert and oriented. Sensation intact to BLE.   BMET    Component Value Date/Time   NA 138 08/03/2014 1028   NA 142 11/10/2013 0522   K 4.6 08/03/2014 1028   K 4.6 11/10/2013 0522   CL 104 08/03/2014 1028   CL 110* 11/10/2013 0522   CO2 26 08/03/2014 1028   CO2 27 11/10/2013 0522   GLUCOSE 198* 08/03/2014 1028    GLUCOSE 70 11/10/2013 0522   BUN 22 08/03/2014 1028   BUN 22* 11/10/2013 0522   CREATININE 1.16 08/03/2014 1028   CREATININE 1.12 11/10/2013 0522   CALCIUM 9.3 08/03/2014 1028   CALCIUM 8.4* 11/10/2013 0522   GFRNONAA 47* 11/10/2013 0522   GFRAA 54* 11/10/2013 0522    Lipid Panel     Component Value Date/Time   CHOL 168 08/03/2014 1028   TRIG 175.0* 08/03/2014 1028   HDL 35.80* 08/03/2014 1028   CHOLHDL 5 08/03/2014 1028   VLDL  35.0 08/03/2014 1028   LDLCALC 97 08/03/2014 1028    CBC    Component Value Date/Time   WBC 4.7 08/03/2014 1028   WBC 5.5 11/09/2013 1540   RBC 4.25 08/03/2014 1028   RBC 4.18 11/09/2013 1540   HGB 12.1 08/03/2014 1028   HGB 12.1 11/09/2013 1540   HCT 35.9* 08/03/2014 1028   HCT 36.3 11/09/2013 1540   PLT 178.0 08/03/2014 1028   PLT 183 11/09/2013 1540   MCV 84.5 08/03/2014 1028   MCV 87 11/09/2013 1540   MCH 29.1 11/09/2013 1540   MCHC 33.6 08/03/2014 1028   MCHC 33.5 11/09/2013 1540   RDW 13.8 08/03/2014 1028   RDW 14.0 11/09/2013 1540    Hgb A1C Lab Results  Component Value Date   HGBA1C 8.0* 08/03/2014        Assessment & Plan:

## 2015-02-18 ENCOUNTER — Other Ambulatory Visit: Payer: Self-pay | Admitting: Internal Medicine

## 2015-02-18 MED ORDER — EMPAGLIFLOZIN-LINAGLIPTIN 10-5 MG PO TABS: 1.0000 | ORAL_TABLET | Freq: Every day | ORAL | Status: DC

## 2015-02-18 NOTE — Telephone Encounter (Signed)
The first message was confusing. I will refill all of her meds.

## 2015-02-18 NOTE — Telephone Encounter (Signed)
Whose her new PCP, I just saw her about 2 weeks ago?

## 2015-02-18 NOTE — Telephone Encounter (Signed)
This pts pharmacy faxed a med refill and we are no longer her PCP.

## 2015-02-18 NOTE — Telephone Encounter (Signed)
Pt est care with you 08/2014

## 2015-03-11 ENCOUNTER — Telehealth: Payer: Self-pay

## 2015-03-11 DIAGNOSIS — E119 Type 2 diabetes mellitus without complications: Secondary | ICD-10-CM

## 2015-03-11 NOTE — Telephone Encounter (Signed)
Pt said she is out of Glyxambi; Pts ins request call at [phone] for prior auth for Glyxambi. Pt request cb. [facility].

## 2015-03-12 NOTE — Telephone Encounter (Signed)
Lets see if the insurance tells [place] if there are any alternatives. We may have to break in down into 2 different medications.

## 2015-03-12 NOTE — Telephone Encounter (Signed)
Glyxambi is needing PA now--PA has been [facility]--awaiting response--but what would be pt's alternative if denied--please advise as pt has been out of meds x 2-3 days

## 2015-03-16 NOTE — Telephone Encounter (Signed)
Pt left v/m requesting cb about Glyxambi; pt is out of med.

## 2015-03-17 NOTE — Telephone Encounter (Signed)
Spoke to [employer] and they stated that [place] 10mg  went through insurance with $10 copay and Tradjenta went through for a $10 co pay please advise---lmovm asking pt if she was okay with sending in 2 different Rx for the co pay listed---if you are okay with sending in Rx give me the okay and I will confirm with pt first to see if she is okay

## 2015-03-17 NOTE — Telephone Encounter (Signed)
Medication has been denied---there are no alternatives as there are no other medications in this class with like ingredients---please advise

## 2015-03-17 NOTE — Telephone Encounter (Signed)
We can break it up into 2 different meds. Can you see what the cover between: Invokana vs Jardience and Januvia vs Tradjenta.

## 2015-03-18 MED ORDER — EMPAGLIFLOZIN 10 MG PO TABS: 10.0000 mg | ORAL_TABLET | Freq: Every day | ORAL | Status: DC

## 2015-03-18 MED ORDER — LINAGLIPTIN 5 MG PO TABS: 5.0000 mg | ORAL_TABLET | Freq: Every day | ORAL | Status: DC

## 2015-03-18 NOTE — Addendum Note (Signed)
Addended by: Lurlean Nanny on: 03/18/2015 08:49 AM   Modules accepted: Orders

## 2015-03-18 NOTE — Telephone Encounter (Signed)
Ok to send in [place] if okay with pt

## 2015-03-18 NOTE — Telephone Encounter (Signed)
Pt is aware and Rx sent to pharmacy

## 2015-05-12 ENCOUNTER — Other Ambulatory Visit: Payer: Self-pay

## 2015-05-12 DIAGNOSIS — E119 Type 2 diabetes mellitus without complications: Secondary | ICD-10-CM

## 2015-05-12 MED ORDER — LINAGLIPTIN 5 MG PO TABS: 5.0000 mg | ORAL_TABLET | Freq: Every day | ORAL | Status: DC

## 2015-05-12 MED ORDER — EMPAGLIFLOZIN 10 MG PO TABS: 10.0000 mg | ORAL_TABLET | Freq: Every day | ORAL | Status: DC

## 2015-05-12 NOTE — Telephone Encounter (Signed)
Pt left note requesting 90 day refill tradjenta and jardiance to [facility]. Advised pt done. Per protocol; last seen 02/02/15. [facility] (spoke with kayla)notified to cancel # [phone] with additional refills on each.

## 2015-07-08 ENCOUNTER — Encounter: Payer: Self-pay | Admitting: Family Medicine

## 2015-07-08 ENCOUNTER — Ambulatory Visit (INDEPENDENT_AMBULATORY_CARE_PROVIDER_SITE_OTHER): Payer: Medicare Other | Admitting: Family Medicine

## 2015-07-08 VITALS — BP 114/62 | HR 73 | Temp 97.7°F | Ht 64.75 in | Wt 185.8 lb

## 2015-07-08 DIAGNOSIS — E1122 Type 2 diabetes mellitus with diabetic chronic kidney disease: Secondary | ICD-10-CM | POA: Diagnosis not present

## 2015-07-08 DIAGNOSIS — E119 Type 2 diabetes mellitus without complications: Secondary | ICD-10-CM | POA: Diagnosis not present

## 2015-07-08 DIAGNOSIS — Z8619 Personal history of other infectious and parasitic diseases: Secondary | ICD-10-CM

## 2015-07-08 DIAGNOSIS — I10 Essential (primary) hypertension: Secondary | ICD-10-CM | POA: Diagnosis not present

## 2015-07-08 DIAGNOSIS — E785 Hyperlipidemia, unspecified: Secondary | ICD-10-CM

## 2015-07-08 DIAGNOSIS — N183 Chronic kidney disease, stage 3 (moderate): Secondary | ICD-10-CM

## 2015-07-08 DIAGNOSIS — Z205 Contact with and (suspected) exposure to viral hepatitis: Secondary | ICD-10-CM | POA: Diagnosis not present

## 2015-07-08 LAB — LIPID PANEL
Cholesterol: 195 mg/dL (ref 0–200)
HDL: 33.8 mg/dL — ABNORMAL LOW (ref >39.00)
NonHDL: 161.56
Total CHOL/HDL Ratio: 6
Triglycerides: 211 mg/dL — ABNORMAL HIGH (ref 0.0–149.0)
VLDL: 42.2 mg/dL — ABNORMAL HIGH (ref 0.0–40.0)

## 2015-07-08 LAB — HEMOGLOBIN A1C: Hgb A1c MFr Bld: 7.8 % — ABNORMAL HIGH (ref 4.6–6.5)

## 2015-07-08 LAB — COMPREHENSIVE METABOLIC PANEL
ALT: 13 U/L (ref 0–35)
AST: 15 U/L (ref 0–37)
Albumin: 4.2 g/dL (ref 3.5–5.2)
Alkaline Phosphatase: 51 U/L (ref 39–117)
BUN: 18 mg/dL (ref 6–23)
CO2: 28 mEq/L (ref 19–32)
Calcium: 9.7 mg/dL (ref 8.4–10.5)
Chloride: 105 mEq/L (ref 96–112)
Creatinine, Ser: 1.25 mg/dL — ABNORMAL HIGH (ref 0.40–1.20)
GFR: 53.07 mL/min — ABNORMAL LOW (ref >60.00)
Glucose, Bld: 203 mg/dL — ABNORMAL HIGH (ref 70–99)
Potassium: 4.8 mEq/L (ref 3.5–5.1)
Sodium: 141 mEq/L (ref 135–145)
Total Bilirubin: 0.7 mg/dL (ref 0.2–1.2)
Total Protein: 7.7 g/dL (ref 6.0–8.3)

## 2015-07-08 LAB — CBC
HCT: 36.4 % (ref 36.0–46.0)
Hemoglobin: 12.2 g/dL (ref 12.0–15.0)
MCHC: 33.4 g/dL (ref 30.0–36.0)
MCV: 87 fl (ref 78.0–100.0)
Platelets: 178 10*3/uL (ref 150.0–400.0)
RBC: 4.19 Mil/uL (ref 3.87–5.11)
RDW: 12.9 % (ref 11.5–15.5)
WBC: 4.8 10*3/uL (ref 4.0–10.5)

## 2015-07-08 LAB — LDL CHOLESTEROL, DIRECT: Direct LDL: 114 mg/dL

## 2015-07-08 NOTE — Assessment & Plan Note (Signed)
Stable. I would like better control as her LDL is just below 100. However patient is resistant to medication changes. Will continue to monitor closely. Continue Lipitor 40 mg daily.

## 2015-07-08 NOTE — Progress Notes (Signed)
Pre visit review using our clinic review tool, if applicable. No additional management support is needed unless otherwise documented below in the visit note. 

## 2015-07-08 NOTE — Patient Instructions (Signed)
It was nice to see you.  Continue your current medications until you hear from me.  Follow up in 3-6 months.  We will call with your labs.  Take care  Dr. Lacinda Axon

## 2015-07-08 NOTE — Progress Notes (Signed)
Subjective:  [name], female    DOB: 1933-08-02  Age: 82 y.o. MRN: [id number]  CC: Establish care  HPI:   82 year old female with a past medical history of hypertension, DM 2, CKD stage III , hyperlipidemia presents to establish care with me.  HTN  Well controlled.  Patient is compliant with amlodipine and lisinopril.  Patient would like to discuss decreasing and potentially discontinuing some of her medication.  HLD  Most recent lipid panel revealed LDL of 96.  Patient is compliant with Statin and tolerating without difficulty.  DM-2  Last A1c revealed good control for a patient her age. A1c - 7.2.  She is currently taking metformin, Jardiance, and Tradjenta.  No reports of hypoglycemia.  Eye exam up to date.   CKD III  Creatinine appears to be stable but has been slowly worsening.  She is compliant with ACE inhibitor.  She is followed by Dr. Juleen China.   Social Hx   Social History   Social History  . Marital Status: Married    Spouse Name: N/A  . Number of Children: 2  . Years of Education: N/A   Occupational History  . 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 Smoking status: Never Smoker   . Smokeless tobacco: None  . Alcohol Use: No  . Drug Use: No  . Sexual Activity: No   Other Topics Concern  . None   Social History Narrative   She is married and has two adult children (adopted).    Hobbies: Decorating   Exercise: None   Caffeine: 4 cups a week   Review of Systems  Constitutional: Negative.   Respiratory: Negative.     Objective:  BP 114/62 mmHg  Pulse 73  Temp(Src) 97.7 F (36.5 C) (Oral)  Ht 5' 4.75" (1.645 m)  Wt 185 lb 12 oz (84.256 kg)  BMI 31.14 kg/m2  SpO2 95%  BP/Weight 07/08/2015 02/02/2015 3/64/6803  Systolic BP 212 248 250  Diastolic BP 62 70 82  Wt. (Lbs) 185.75 185 188  BMI 31.14 30.79 31.28   Physical Exam  Constitutional: She appears well-developed. No  distress.  Cardiovascular: Normal rate and regular rhythm.   Pulmonary/Chest: Effort normal and breath sounds normal. No respiratory distress. She has no wheezes. She has no rales.  Neurological: She is alert.  Psychiatric: She has a normal mood and affect.  Vitals reviewed.  Diabetic Foot Check -  Appearance - no lesions, ulcers or calluses Skin - no unusual pallor or redness Monofilament testing -  Right - Great toe, medial, central, lateral ball and posterior foot intact Left - Great toe, medial, central, lateral ball and posterior foot intact  Lab Results  Component Value Date   WBC 4.5 02/02/2015   HGB 12.2 02/02/2015   HCT 35.7* 02/02/2015   PLT 164.0 02/02/2015   GLUCOSE 214* 02/02/2015   CHOL 160 02/02/2015   TRIG 127.0 02/02/2015   HDL 39.00* 02/02/2015   LDLCALC 96 02/02/2015   ALT 13 02/02/2015   AST 19 02/02/2015   NA 139 02/02/2015   K 4.6 02/02/2015   CL 105 02/02/2015   CREATININE 1.47* 02/02/2015   BUN 22 02/02/2015   CO2 25 02/02/2015   HGBA1C 7.2* 02/02/2015   MICROALBUR 12.3* 08/03/2014    Assessment & Plan:   Problem List Items Addressed This Visit    HLD (hyperlipidemia)    Stable. I would like better control  as her LDL is just below 100. However patient is resistant to medication changes. Will continue to monitor closely. Continue Lipitor 40 mg daily.      Relevant Orders   Lipid panel   Essential hypertension    Well controlled. Decreasing Norvasc to 5 mg daily given BP today. Patient to continue lisinopril 40 mg daily.      DM type 2 (diabetes mellitus, type 2) ([place]) - Primary    Last A1c revealed good control. Obtaining A1c today. Continuing current therapy at this time. May need adjustment in medication depending on renal function. Her last metabolic panel revealed a GFR less than 45 which is the cutoff for Jardiance and Metformin.      Relevant Orders   Comp Met (CMET)   HgB A1c   CKD stage 3 due to type 2 diabetes mellitus  ([place])    Followed by nephrology. Obtaining metabolic panel today.      Relevant Orders   CBC    Other Visit Diagnoses    History of hepatitis        Relevant Orders    Hepatitis C Antibody    Hepatitis B Surface AntiGEN    Hepatitis B Surface AntiBODY      Follow-up: Return in about 3 months (around 10/06/2015).  [place]

## 2015-07-08 NOTE — Assessment & Plan Note (Signed)
Followed by nephrology. Obtaining metabolic panel today.

## 2015-07-08 NOTE — Assessment & Plan Note (Signed)
Last A1c revealed good control. Obtaining A1c today. Continuing current therapy at this time. May need adjustment in medication depending on renal function. Her last metabolic panel revealed a GFR less than 45 which is the cutoff for Jardiance and Metformin.

## 2015-07-08 NOTE — Assessment & Plan Note (Signed)
Well controlled. Decreasing Norvasc to 5 mg daily given BP today. Patient to continue lisinopril 40 mg daily.

## 2015-07-09 LAB — HEPATITIS B SURFACE ANTIBODY,QUALITATIVE: Hep B S Ab: NEGATIVE

## 2015-07-09 LAB — HEPATITIS C ANTIBODY: HCV Ab: REACTIVE — AB

## 2015-07-09 LAB — HEPATITIS B SURFACE ANTIGEN: Hepatitis B Surface Ag: NEGATIVE

## 2015-07-13 LAB — HEPATITIS C RNA QUANTITATIVE: HCV Quantitative: NOT DETECTED IU/mL (ref <15)

## 2015-07-15 DIAGNOSIS — N2581 Secondary hyperparathyroidism of renal origin: Secondary | ICD-10-CM | POA: Diagnosis not present

## 2015-07-15 DIAGNOSIS — I129 Hypertensive chronic kidney disease with stage 1 through stage 4 chronic kidney disease, or unspecified chronic kidney disease: Secondary | ICD-10-CM | POA: Diagnosis not present

## 2015-07-15 DIAGNOSIS — E1122 Type 2 diabetes mellitus with diabetic chronic kidney disease: Secondary | ICD-10-CM | POA: Diagnosis not present

## 2015-07-15 DIAGNOSIS — N183 Chronic kidney disease, stage 3 (moderate): Secondary | ICD-10-CM | POA: Diagnosis not present

## 2015-07-15 DIAGNOSIS — E785 Hyperlipidemia, unspecified: Secondary | ICD-10-CM | POA: Diagnosis not present

## 2015-08-12 ENCOUNTER — Encounter: Payer: Medicare Other | Admitting: Internal Medicine

## 2015-08-15 ENCOUNTER — Other Ambulatory Visit: Payer: Self-pay | Admitting: Internal Medicine

## 2015-08-16 NOTE — Telephone Encounter (Signed)
Looks like pt just est care with you--please advise if okay to refill medications

## 2015-08-24 DIAGNOSIS — H43813 Vitreous degeneration, bilateral: Secondary | ICD-10-CM | POA: Diagnosis not present

## 2015-08-24 DIAGNOSIS — H25813 Combined forms of age-related cataract, bilateral: Secondary | ICD-10-CM | POA: Diagnosis not present

## 2015-08-24 DIAGNOSIS — H524 Presbyopia: Secondary | ICD-10-CM | POA: Diagnosis not present

## 2015-08-24 DIAGNOSIS — H5211 Myopia, right eye: Secondary | ICD-10-CM | POA: Diagnosis not present

## 2015-08-24 DIAGNOSIS — E113212 Type 2 diabetes mellitus with mild nonproliferative diabetic retinopathy with macular edema, left eye: Secondary | ICD-10-CM | POA: Diagnosis not present

## 2015-08-24 DIAGNOSIS — H52222 Regular astigmatism, left eye: Secondary | ICD-10-CM | POA: Diagnosis not present

## 2015-08-24 DIAGNOSIS — H5202 Hypermetropia, left eye: Secondary | ICD-10-CM | POA: Diagnosis not present

## 2015-08-24 DIAGNOSIS — E119 Type 2 diabetes mellitus without complications: Secondary | ICD-10-CM | POA: Diagnosis not present

## 2015-09-08 ENCOUNTER — Encounter (INDEPENDENT_AMBULATORY_CARE_PROVIDER_SITE_OTHER): Payer: Medicare Other | Admitting: Ophthalmology

## 2015-09-08 DIAGNOSIS — H2513 Age-related nuclear cataract, bilateral: Secondary | ICD-10-CM | POA: Diagnosis not present

## 2015-09-08 DIAGNOSIS — E11319 Type 2 diabetes mellitus with unspecified diabetic retinopathy without macular edema: Secondary | ICD-10-CM

## 2015-09-08 DIAGNOSIS — H35033 Hypertensive retinopathy, bilateral: Secondary | ICD-10-CM

## 2015-09-08 DIAGNOSIS — I10 Essential (primary) hypertension: Secondary | ICD-10-CM

## 2015-09-08 DIAGNOSIS — E113391 Type 2 diabetes mellitus with moderate nonproliferative diabetic retinopathy without macular edema, right eye: Secondary | ICD-10-CM

## 2015-09-08 DIAGNOSIS — E113212 Type 2 diabetes mellitus with mild nonproliferative diabetic retinopathy with macular edema, left eye: Secondary | ICD-10-CM | POA: Diagnosis not present

## 2015-09-08 DIAGNOSIS — H43813 Vitreous degeneration, bilateral: Secondary | ICD-10-CM | POA: Diagnosis not present

## 2015-10-07 ENCOUNTER — Encounter (INDEPENDENT_AMBULATORY_CARE_PROVIDER_SITE_OTHER): Payer: Medicare Other | Admitting: Ophthalmology

## 2015-10-07 DIAGNOSIS — H43813 Vitreous degeneration, bilateral: Secondary | ICD-10-CM

## 2015-10-07 DIAGNOSIS — E113291 Type 2 diabetes mellitus with mild nonproliferative diabetic retinopathy without macular edema, right eye: Secondary | ICD-10-CM | POA: Diagnosis not present

## 2015-10-07 DIAGNOSIS — H2513 Age-related nuclear cataract, bilateral: Secondary | ICD-10-CM

## 2015-10-07 DIAGNOSIS — E113212 Type 2 diabetes mellitus with mild nonproliferative diabetic retinopathy with macular edema, left eye: Secondary | ICD-10-CM | POA: Diagnosis not present

## 2015-10-07 DIAGNOSIS — I10 Essential (primary) hypertension: Secondary | ICD-10-CM

## 2015-10-07 DIAGNOSIS — H35033 Hypertensive retinopathy, bilateral: Secondary | ICD-10-CM | POA: Diagnosis not present

## 2015-10-07 DIAGNOSIS — E11311 Type 2 diabetes mellitus with unspecified diabetic retinopathy with macular edema: Secondary | ICD-10-CM | POA: Diagnosis not present

## 2015-10-19 DIAGNOSIS — H6123 Impacted cerumen, bilateral: Secondary | ICD-10-CM | POA: Diagnosis not present

## 2015-10-19 DIAGNOSIS — H903 Sensorineural hearing loss, bilateral: Secondary | ICD-10-CM | POA: Diagnosis not present

## 2015-11-04 ENCOUNTER — Encounter (INDEPENDENT_AMBULATORY_CARE_PROVIDER_SITE_OTHER): Payer: Medicare Other | Admitting: Ophthalmology

## 2015-11-04 DIAGNOSIS — H43813 Vitreous degeneration, bilateral: Secondary | ICD-10-CM | POA: Diagnosis not present

## 2015-11-04 DIAGNOSIS — H2513 Age-related nuclear cataract, bilateral: Secondary | ICD-10-CM

## 2015-11-04 DIAGNOSIS — E113391 Type 2 diabetes mellitus with moderate nonproliferative diabetic retinopathy without macular edema, right eye: Secondary | ICD-10-CM

## 2015-11-04 DIAGNOSIS — I10 Essential (primary) hypertension: Secondary | ICD-10-CM | POA: Diagnosis not present

## 2015-11-04 DIAGNOSIS — E113312 Type 2 diabetes mellitus with moderate nonproliferative diabetic retinopathy with macular edema, left eye: Secondary | ICD-10-CM

## 2015-11-04 DIAGNOSIS — E11311 Type 2 diabetes mellitus with unspecified diabetic retinopathy with macular edema: Secondary | ICD-10-CM | POA: Diagnosis not present

## 2015-11-04 DIAGNOSIS — H35033 Hypertensive retinopathy, bilateral: Secondary | ICD-10-CM | POA: Diagnosis not present

## 2015-12-02 DIAGNOSIS — H2513 Age-related nuclear cataract, bilateral: Secondary | ICD-10-CM | POA: Diagnosis not present

## 2015-12-02 DIAGNOSIS — H40013 Open angle with borderline findings, low risk, bilateral: Secondary | ICD-10-CM | POA: Diagnosis not present

## 2015-12-02 DIAGNOSIS — E113312 Type 2 diabetes mellitus with moderate nonproliferative diabetic retinopathy with macular edema, left eye: Secondary | ICD-10-CM | POA: Diagnosis not present

## 2015-12-02 DIAGNOSIS — H35033 Hypertensive retinopathy, bilateral: Secondary | ICD-10-CM | POA: Diagnosis not present

## 2015-12-03 ENCOUNTER — Encounter (INDEPENDENT_AMBULATORY_CARE_PROVIDER_SITE_OTHER): Payer: Medicare Other | Admitting: Ophthalmology

## 2015-12-03 DIAGNOSIS — I10 Essential (primary) hypertension: Secondary | ICD-10-CM | POA: Diagnosis not present

## 2015-12-03 DIAGNOSIS — H2513 Age-related nuclear cataract, bilateral: Secondary | ICD-10-CM | POA: Diagnosis not present

## 2015-12-03 DIAGNOSIS — H35033 Hypertensive retinopathy, bilateral: Secondary | ICD-10-CM | POA: Diagnosis not present

## 2015-12-03 DIAGNOSIS — E11311 Type 2 diabetes mellitus with unspecified diabetic retinopathy with macular edema: Secondary | ICD-10-CM

## 2015-12-03 DIAGNOSIS — E113312 Type 2 diabetes mellitus with moderate nonproliferative diabetic retinopathy with macular edema, left eye: Secondary | ICD-10-CM

## 2015-12-03 DIAGNOSIS — E113391 Type 2 diabetes mellitus with moderate nonproliferative diabetic retinopathy without macular edema, right eye: Secondary | ICD-10-CM

## 2015-12-03 DIAGNOSIS — H43813 Vitreous degeneration, bilateral: Secondary | ICD-10-CM

## 2016-01-05 DIAGNOSIS — N183 Chronic kidney disease, stage 3 (moderate): Secondary | ICD-10-CM | POA: Diagnosis not present

## 2016-01-05 DIAGNOSIS — R809 Proteinuria, unspecified: Secondary | ICD-10-CM | POA: Diagnosis not present

## 2016-01-05 DIAGNOSIS — E1122 Type 2 diabetes mellitus with diabetic chronic kidney disease: Secondary | ICD-10-CM | POA: Diagnosis not present

## 2016-01-05 DIAGNOSIS — I129 Hypertensive chronic kidney disease with stage 1 through stage 4 chronic kidney disease, or unspecified chronic kidney disease: Secondary | ICD-10-CM | POA: Diagnosis not present

## 2016-01-06 ENCOUNTER — Ambulatory Visit (INDEPENDENT_AMBULATORY_CARE_PROVIDER_SITE_OTHER): Payer: Medicare Other | Admitting: Family Medicine

## 2016-01-06 ENCOUNTER — Encounter: Payer: Self-pay | Admitting: Family Medicine

## 2016-01-06 VITALS — BP 130/68 | HR 68 | Temp 97.8°F | Wt 184.2 lb

## 2016-01-06 DIAGNOSIS — I10 Essential (primary) hypertension: Secondary | ICD-10-CM

## 2016-01-06 DIAGNOSIS — E1122 Type 2 diabetes mellitus with diabetic chronic kidney disease: Secondary | ICD-10-CM

## 2016-01-06 DIAGNOSIS — E785 Hyperlipidemia, unspecified: Secondary | ICD-10-CM | POA: Diagnosis not present

## 2016-01-06 DIAGNOSIS — N183 Chronic kidney disease, stage 3 unspecified: Secondary | ICD-10-CM

## 2016-01-06 LAB — VITAMIN D 25 HYDROXY (VIT D DEFICIENCY, FRACTURES): VITD: 41.14 ng/mL (ref 30.00–100.00)

## 2016-01-06 LAB — HEMOGLOBIN A1C: Hgb A1c MFr Bld: 7.8 % — ABNORMAL HIGH (ref 4.6–6.5)

## 2016-01-06 LAB — RENAL FUNCTION PANEL
Albumin: 4.3 g/dL (ref 3.5–5.2)
BUN: 20 mg/dL (ref 6–23)
CO2: 28 mEq/L (ref 19–32)
Calcium: 9.5 mg/dL (ref 8.4–10.5)
Chloride: 104 mEq/L (ref 96–112)
Creatinine, Ser: 1.36 mg/dL — ABNORMAL HIGH (ref 0.40–1.20)
GFR: 48.08 mL/min — ABNORMAL LOW (ref >60.00)
Glucose, Bld: 165 mg/dL — ABNORMAL HIGH (ref 70–99)
Phosphorus: 3.7 mg/dL (ref 2.3–4.6)
Potassium: 5 mEq/L (ref 3.5–5.1)
Sodium: 139 mEq/L (ref 135–145)

## 2016-01-06 LAB — CBC WITH DIFFERENTIAL/PLATELET
Basophils Absolute: 0 10*3/uL (ref 0.0–0.1)
Basophils Relative: 0.6 % (ref 0.0–3.0)
Eosinophils Absolute: 0.2 10*3/uL (ref 0.0–0.7)
Eosinophils Relative: 4.6 % (ref 0.0–5.0)
HCT: 36.6 % (ref 36.0–46.0)
Hemoglobin: 12.4 g/dL (ref 12.0–15.0)
Lymphocytes Relative: 31.9 % (ref 12.0–46.0)
Lymphs Abs: 1.4 10*3/uL (ref 0.7–4.0)
MCHC: 34 g/dL (ref 30.0–36.0)
MCV: 84.9 fl (ref 78.0–100.0)
Monocytes Absolute: 0.4 10*3/uL (ref 0.1–1.0)
Monocytes Relative: 8.5 % (ref 3.0–12.0)
Neutro Abs: 2.4 10*3/uL (ref 1.4–7.7)
Neutrophils Relative %: 54.4 % (ref 43.0–77.0)
Platelets: 192 10*3/uL (ref 150.0–400.0)
RBC: 4.31 Mil/uL (ref 3.87–5.11)
RDW: 13.6 % (ref 11.5–15.5)
WBC: 4.4 10*3/uL (ref 4.0–10.5)

## 2016-01-06 LAB — URINALYSIS
Bilirubin Urine: NEGATIVE
Hgb urine dipstick: NEGATIVE
Ketones, ur: NEGATIVE
Leukocytes, UA: NEGATIVE
Nitrite: NEGATIVE
Specific Gravity, Urine: <=1.005 — AB (ref 1.000–1.030)
Total Protein, Urine: NEGATIVE
Urine Glucose: >=1000 — AB
Urobilinogen, UA: 0.2 (ref 0.0–1.0)
pH: 6 (ref 5.0–8.0)

## 2016-01-06 MED ORDER — ATORVASTATIN CALCIUM 80 MG PO TABS: 80.0000 mg | ORAL_TABLET | Freq: Every day | ORAL | Status: DC

## 2016-01-06 NOTE — Progress Notes (Signed)
Pre visit review using our clinic review tool, if applicable. No additional management support is needed unless otherwise documented below in the visit note. 

## 2016-01-06 NOTE — Patient Instructions (Signed)
I have increased the Lipitor. It is at the pharmacy.  Continue your other medications.  Follow up in 6 months.  Take care  Dr. Lacinda Axon

## 2016-01-06 NOTE — Assessment & Plan Note (Signed)
At goal.  Continue lisinopril and amlodipine. 

## 2016-01-06 NOTE — Assessment & Plan Note (Signed)
Labs today. See orders. Per nephrology.

## 2016-01-06 NOTE — Assessment & Plan Note (Signed)
Not goal. Increasing to Lipitor 80 mg.

## 2016-01-06 NOTE — Assessment & Plan Note (Signed)
Has been fairly well controlled. Higher goal A1c given age (6). A1c today. Inciting continue metformin, Tradjenta, Invokana.

## 2016-01-06 NOTE — Progress Notes (Signed)
Subjective:  [name], female    DOB: 1934-05-21  Age: 82 y.o. MRN: [id number]  CC: Follow up  HPI:  82 year old female with DM 2, hypertension, hyperlipidemia, CKD presents for follow-up.  DM  Blood sugars readings - Typically in the 150's - 160's.  Hypoglycemia - No.  Medications - Tradjenta, Jardiance, Metformin.  Adverse effects - None.   Compliance - Yes.  Preventative care  Eye exam - Upcoming eye exam this month.  Foot exam - to be performed today.  Last A1C - In need of A1C today.  Urine microalbumin - Followed by nephrology. Labs today.  HLD  Not a goal.  Would prefer LDL under 70 given comorbidities.  Tolerating Lipitor 40. Will discuss increase today.  HTN  Well controlled.  Stable on lisinopril and amlodipine.  CKD   Needs labs per nephrologist today.  Social Hx   Social History   Social History  . Marital Status: Married    Spouse Name: N/A  . Number of Children: 2  . Years of Education: N/A   Occupational History  . 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 Smoking status: Never Smoker   . Smokeless tobacco: None  . Alcohol Use: No  . Drug Use: No  . Sexual Activity: No   Other Topics Concern  . None   Social History Narrative   She is married and has two adult children (adopted).    Hobbies: Decorating   Exercise: None   Caffeine: 4 cups a week   Review of Systems  Constitutional: Negative.   Respiratory: Negative.   Cardiovascular: Negative.   Genitourinary: Negative.    Objective:  BP 130/68 mmHg  Pulse 68  Temp(Src) 97.8 F (36.6 C) (Oral)  Wt 184 lb 4 oz (83.575 kg)  SpO2 96%  BP/Weight 01/06/2016 [id number]  Systolic BP [id number]  Diastolic BP 68 62 70  Wt. (Lbs) 184.25 185.75 185  BMI 30.88 31.14 30.79   Physical Exam  Constitutional: She is oriented to person, place, and time. No distress.  Cardiovascular: Normal rate and regular  rhythm.   Pulmonary/Chest: Effort normal. She has no wheezes. She has no rales.  Neurological: She is alert and oriented to person, place, and time.  Psychiatric: She has a normal mood and affect.  Vitals reviewed. Foot exam performed today - see quality metrics.  Lab Results  Component Value Date   WBC 4.8 07/08/2015   HGB 12.2 07/08/2015   HCT 36.4 07/08/2015   PLT 178.0 07/08/2015   GLUCOSE 203* 07/08/2015   CHOL 195 07/08/2015   TRIG 211.0* 07/08/2015   HDL 33.80* 07/08/2015   LDLDIRECT 114.0 07/08/2015   LDLCALC 96 02/02/2015   ALT 13 07/08/2015   AST 15 07/08/2015   NA 141 07/08/2015   K 4.8 07/08/2015   CL 105 07/08/2015   CREATININE 1.25* 07/08/2015   BUN 18 07/08/2015   CO2 28 07/08/2015   HGBA1C 7.8* 07/08/2015   MICROALBUR 12.3* 08/03/2014   Assessment & Plan:   Problem List Items Addressed This Visit    Essential hypertension    At goal. Continue lisinopril and amlodipine.      Relevant Medications   atorvastatin (LIPITOR) 80 MG tablet   HLD (hyperlipidemia)    Not goal. Increasing to Lipitor 80 mg.      Relevant Medications   atorvastatin (LIPITOR) 80 MG tablet   DM type 2 (  diabetes mellitus, type 2) ([place]) - Primary    Has been fairly well controlled. Higher goal A1c given age (65). A1c today. Inciting continue metformin, Tradjenta, Invokana.       Relevant Medications   atorvastatin (LIPITOR) 80 MG tablet   Other Relevant Orders   HgB A1c   Protein / Creatinine Ratio, Urine   CKD stage 3 due to type 2 diabetes mellitus ([place])    Labs today. See orders. Per nephrology.      Relevant Medications   atorvastatin (LIPITOR) 80 MG tablet   Other Relevant Orders   CBC with Differential/Platelet   Vitamin D (25 hydroxy)   Renal Function Panel   Urinalysis   PTH, intact (no Ca)   Protein / Creatinine Ratio, Urine     Meds ordered this encounter  Medications  . atorvastatin (LIPITOR) 80 MG tablet    Sig: Take 1 tablet (80 mg total) by  mouth daily.    Dispense:  90 tablet    Refill:  3    Follow-up: 6 months.  [place]

## 2016-01-07 ENCOUNTER — Encounter (INDEPENDENT_AMBULATORY_CARE_PROVIDER_SITE_OTHER): Payer: Medicare Other | Admitting: Ophthalmology

## 2016-01-07 DIAGNOSIS — H35033 Hypertensive retinopathy, bilateral: Secondary | ICD-10-CM

## 2016-01-07 DIAGNOSIS — E11311 Type 2 diabetes mellitus with unspecified diabetic retinopathy with macular edema: Secondary | ICD-10-CM

## 2016-01-07 DIAGNOSIS — E113312 Type 2 diabetes mellitus with moderate nonproliferative diabetic retinopathy with macular edema, left eye: Secondary | ICD-10-CM

## 2016-01-07 DIAGNOSIS — H43813 Vitreous degeneration, bilateral: Secondary | ICD-10-CM

## 2016-01-07 DIAGNOSIS — E113291 Type 2 diabetes mellitus with mild nonproliferative diabetic retinopathy without macular edema, right eye: Secondary | ICD-10-CM

## 2016-01-07 DIAGNOSIS — I10 Essential (primary) hypertension: Secondary | ICD-10-CM | POA: Diagnosis not present

## 2016-01-07 LAB — PROTEIN / CREATININE RATIO, URINE
Creatinine, Urine: 78 mg/dL (ref 20–320)
Protein Creatinine Ratio: 205 mg/g creat — ABNORMAL HIGH (ref 21–161)
Total Protein, Urine: 16 mg/dL (ref 5–24)

## 2016-01-07 LAB — PARATHYROID HORMONE, INTACT (NO CA): PTH: 83 pg/mL — ABNORMAL HIGH (ref 14–64)

## 2016-01-18 DIAGNOSIS — H2511 Age-related nuclear cataract, right eye: Secondary | ICD-10-CM | POA: Diagnosis not present

## 2016-01-18 DIAGNOSIS — H25811 Combined forms of age-related cataract, right eye: Secondary | ICD-10-CM | POA: Diagnosis not present

## 2016-01-31 ENCOUNTER — Other Ambulatory Visit: Payer: Self-pay | Admitting: Internal Medicine

## 2016-01-31 DIAGNOSIS — E119 Type 2 diabetes mellitus without complications: Secondary | ICD-10-CM

## 2016-02-09 ENCOUNTER — Encounter (INDEPENDENT_AMBULATORY_CARE_PROVIDER_SITE_OTHER): Payer: Medicare Other | Admitting: Ophthalmology

## 2016-02-09 DIAGNOSIS — H2512 Age-related nuclear cataract, left eye: Secondary | ICD-10-CM

## 2016-02-09 DIAGNOSIS — H35033 Hypertensive retinopathy, bilateral: Secondary | ICD-10-CM

## 2016-02-09 DIAGNOSIS — E113291 Type 2 diabetes mellitus with mild nonproliferative diabetic retinopathy without macular edema, right eye: Secondary | ICD-10-CM

## 2016-02-09 DIAGNOSIS — E113312 Type 2 diabetes mellitus with moderate nonproliferative diabetic retinopathy with macular edema, left eye: Secondary | ICD-10-CM

## 2016-02-09 DIAGNOSIS — I10 Essential (primary) hypertension: Secondary | ICD-10-CM | POA: Diagnosis not present

## 2016-02-09 DIAGNOSIS — E11311 Type 2 diabetes mellitus with unspecified diabetic retinopathy with macular edema: Secondary | ICD-10-CM

## 2016-02-09 DIAGNOSIS — H43813 Vitreous degeneration, bilateral: Secondary | ICD-10-CM

## 2016-02-14 DIAGNOSIS — H25012 Cortical age-related cataract, left eye: Secondary | ICD-10-CM | POA: Diagnosis not present

## 2016-02-14 DIAGNOSIS — H2512 Age-related nuclear cataract, left eye: Secondary | ICD-10-CM | POA: Diagnosis not present

## 2016-03-09 ENCOUNTER — Other Ambulatory Visit: Payer: Self-pay | Admitting: Family Medicine

## 2016-03-15 ENCOUNTER — Encounter (INDEPENDENT_AMBULATORY_CARE_PROVIDER_SITE_OTHER): Payer: Medicare Other | Admitting: Ophthalmology

## 2016-03-15 DIAGNOSIS — I10 Essential (primary) hypertension: Secondary | ICD-10-CM

## 2016-03-15 DIAGNOSIS — H2512 Age-related nuclear cataract, left eye: Secondary | ICD-10-CM

## 2016-03-15 DIAGNOSIS — E113291 Type 2 diabetes mellitus with mild nonproliferative diabetic retinopathy without macular edema, right eye: Secondary | ICD-10-CM | POA: Diagnosis not present

## 2016-03-15 DIAGNOSIS — H35033 Hypertensive retinopathy, bilateral: Secondary | ICD-10-CM

## 2016-03-15 DIAGNOSIS — H43813 Vitreous degeneration, bilateral: Secondary | ICD-10-CM

## 2016-03-15 DIAGNOSIS — E113512 Type 2 diabetes mellitus with proliferative diabetic retinopathy with macular edema, left eye: Secondary | ICD-10-CM

## 2016-03-15 DIAGNOSIS — E11311 Type 2 diabetes mellitus with unspecified diabetic retinopathy with macular edema: Secondary | ICD-10-CM

## 2016-03-28 DIAGNOSIS — H2512 Age-related nuclear cataract, left eye: Secondary | ICD-10-CM | POA: Diagnosis not present

## 2016-03-28 DIAGNOSIS — H25012 Cortical age-related cataract, left eye: Secondary | ICD-10-CM | POA: Diagnosis not present

## 2016-04-12 ENCOUNTER — Encounter (INDEPENDENT_AMBULATORY_CARE_PROVIDER_SITE_OTHER): Payer: Medicare Other | Admitting: Ophthalmology

## 2016-04-12 DIAGNOSIS — H35033 Hypertensive retinopathy, bilateral: Secondary | ICD-10-CM | POA: Diagnosis not present

## 2016-04-12 DIAGNOSIS — E113312 Type 2 diabetes mellitus with moderate nonproliferative diabetic retinopathy with macular edema, left eye: Secondary | ICD-10-CM

## 2016-04-12 DIAGNOSIS — H43813 Vitreous degeneration, bilateral: Secondary | ICD-10-CM

## 2016-04-12 DIAGNOSIS — E113391 Type 2 diabetes mellitus with moderate nonproliferative diabetic retinopathy without macular edema, right eye: Secondary | ICD-10-CM

## 2016-04-12 DIAGNOSIS — E11311 Type 2 diabetes mellitus with unspecified diabetic retinopathy with macular edema: Secondary | ICD-10-CM | POA: Diagnosis not present

## 2016-04-12 DIAGNOSIS — I10 Essential (primary) hypertension: Secondary | ICD-10-CM

## 2016-04-19 ENCOUNTER — Encounter (INDEPENDENT_AMBULATORY_CARE_PROVIDER_SITE_OTHER): Payer: Medicare Other | Admitting: Ophthalmology

## 2016-04-24 DIAGNOSIS — H9311 Tinnitus, right ear: Secondary | ICD-10-CM | POA: Diagnosis not present

## 2016-04-24 DIAGNOSIS — H6121 Impacted cerumen, right ear: Secondary | ICD-10-CM | POA: Diagnosis not present

## 2016-05-24 ENCOUNTER — Encounter (INDEPENDENT_AMBULATORY_CARE_PROVIDER_SITE_OTHER): Payer: Medicare Other | Admitting: Ophthalmology

## 2016-05-24 DIAGNOSIS — E113291 Type 2 diabetes mellitus with mild nonproliferative diabetic retinopathy without macular edema, right eye: Secondary | ICD-10-CM

## 2016-05-24 DIAGNOSIS — E113312 Type 2 diabetes mellitus with moderate nonproliferative diabetic retinopathy with macular edema, left eye: Secondary | ICD-10-CM | POA: Diagnosis not present

## 2016-05-24 DIAGNOSIS — H43813 Vitreous degeneration, bilateral: Secondary | ICD-10-CM | POA: Diagnosis not present

## 2016-05-24 DIAGNOSIS — H35033 Hypertensive retinopathy, bilateral: Secondary | ICD-10-CM

## 2016-05-24 DIAGNOSIS — E11311 Type 2 diabetes mellitus with unspecified diabetic retinopathy with macular edema: Secondary | ICD-10-CM

## 2016-05-24 DIAGNOSIS — I10 Essential (primary) hypertension: Secondary | ICD-10-CM

## 2016-07-04 DIAGNOSIS — N183 Chronic kidney disease, stage 3 (moderate): Secondary | ICD-10-CM | POA: Diagnosis not present

## 2016-07-04 DIAGNOSIS — E1122 Type 2 diabetes mellitus with diabetic chronic kidney disease: Secondary | ICD-10-CM | POA: Diagnosis not present

## 2016-07-04 DIAGNOSIS — R809 Proteinuria, unspecified: Secondary | ICD-10-CM | POA: Diagnosis not present

## 2016-07-04 DIAGNOSIS — N2581 Secondary hyperparathyroidism of renal origin: Secondary | ICD-10-CM | POA: Diagnosis not present

## 2016-07-04 DIAGNOSIS — I129 Hypertensive chronic kidney disease with stage 1 through stage 4 chronic kidney disease, or unspecified chronic kidney disease: Secondary | ICD-10-CM | POA: Diagnosis not present

## 2016-07-06 ENCOUNTER — Encounter (INDEPENDENT_AMBULATORY_CARE_PROVIDER_SITE_OTHER): Payer: Medicare Other | Admitting: Ophthalmology

## 2016-07-06 DIAGNOSIS — E113311 Type 2 diabetes mellitus with moderate nonproliferative diabetic retinopathy with macular edema, right eye: Secondary | ICD-10-CM | POA: Diagnosis not present

## 2016-07-06 DIAGNOSIS — H35033 Hypertensive retinopathy, bilateral: Secondary | ICD-10-CM

## 2016-07-06 DIAGNOSIS — H2512 Age-related nuclear cataract, left eye: Secondary | ICD-10-CM

## 2016-07-06 DIAGNOSIS — I10 Essential (primary) hypertension: Secondary | ICD-10-CM | POA: Diagnosis not present

## 2016-07-06 DIAGNOSIS — E11311 Type 2 diabetes mellitus with unspecified diabetic retinopathy with macular edema: Secondary | ICD-10-CM | POA: Diagnosis not present

## 2016-07-06 DIAGNOSIS — E113292 Type 2 diabetes mellitus with mild nonproliferative diabetic retinopathy without macular edema, left eye: Secondary | ICD-10-CM

## 2016-07-06 DIAGNOSIS — H43813 Vitreous degeneration, bilateral: Secondary | ICD-10-CM

## 2016-07-10 ENCOUNTER — Encounter: Payer: Self-pay | Admitting: Family Medicine

## 2016-07-10 ENCOUNTER — Ambulatory Visit (INDEPENDENT_AMBULATORY_CARE_PROVIDER_SITE_OTHER): Payer: Medicare Other | Admitting: Family Medicine

## 2016-07-10 VITALS — BP 123/76 | HR 70 | Temp 97.7°F | Resp 14 | Wt 180.6 lb

## 2016-07-10 DIAGNOSIS — I10 Essential (primary) hypertension: Secondary | ICD-10-CM | POA: Diagnosis not present

## 2016-07-10 DIAGNOSIS — E1122 Type 2 diabetes mellitus with diabetic chronic kidney disease: Secondary | ICD-10-CM | POA: Diagnosis not present

## 2016-07-10 DIAGNOSIS — N183 Chronic kidney disease, stage 3 unspecified: Secondary | ICD-10-CM

## 2016-07-10 DIAGNOSIS — E785 Hyperlipidemia, unspecified: Secondary | ICD-10-CM

## 2016-07-10 LAB — LIPID PANEL
Cholesterol: 194 mg/dL (ref 0–200)
HDL: 39 mg/dL — ABNORMAL LOW (ref >39.00)
LDL Cholesterol: 122 mg/dL — ABNORMAL HIGH (ref 0–99)
NonHDL: 155.21
Total CHOL/HDL Ratio: 5
Triglycerides: 164 mg/dL — ABNORMAL HIGH (ref 0.0–149.0)
VLDL: 32.8 mg/dL (ref 0.0–40.0)

## 2016-07-10 LAB — BASIC METABOLIC PANEL
BUN: 23 mg/dL (ref 6–23)
CO2: 23 mEq/L (ref 19–32)
Calcium: 9.5 mg/dL (ref 8.4–10.5)
Chloride: 104 mEq/L (ref 96–112)
Creatinine, Ser: 1.31 mg/dL — ABNORMAL HIGH (ref 0.40–1.20)
GFR: 50.14 mL/min — ABNORMAL LOW (ref >60.00)
Glucose, Bld: 232 mg/dL — ABNORMAL HIGH (ref 70–99)
Potassium: 4.5 mEq/L (ref 3.5–5.1)
Sodium: 137 mEq/L (ref 135–145)

## 2016-07-10 LAB — HEMOGLOBIN A1C: Hgb A1c MFr Bld: 8.9 % — ABNORMAL HIGH (ref 4.6–6.5)

## 2016-07-10 NOTE — Progress Notes (Signed)
Pre visit review using our clinic review tool, if applicable. No additional management support is needed unless otherwise documented below in the visit note. 

## 2016-07-10 NOTE — Progress Notes (Signed)
Subjective:  [name], female    DOB: 12/08/1933  Age: 83 y.o. MRN: [id number]  CC: Follow up  HPI:  83 year old female with hypertension, hyponatremia, DM 2, CKD presents for follow-up.  DM-2  Fairly well controlled. Sugars ranging from the 140s to 160s.  She is compliant with metformin, tradjenta, and Jardiance. She has been teetering on the renal requirements for metformin and Jardiance.   She states that she is currently unable to afford the Jardiance. She has approximately 2 months supply. She would like to proceed with an alternative or continue with samples.  Of note, she was on Glyxambi in the past (and it was free).  HTN  At goal. Continue amlodipine and lisinopril.  HLD  Currently on max dose Lipitor.  Needs repeat lipid panel today.  Social Hx   Social History   Social History  . Marital status: Married    Spouse name: N/A  . Number of children: 2  . Years of education: N/A   Occupational History  . 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 Smoking status: Never Smoker  . Smokeless tobacco: None  . Alcohol use No  . Drug use: No  . Sexual activity: No   Other Topics Concern  . None   Social History Narrative   She is married and has two adult children (adopted).    Hobbies: Decorating   Exercise: None   Caffeine: 4 cups a week   Review of Systems  Constitutional: Negative.   Respiratory: Negative.   Cardiovascular: Negative.    Objective:  BP 123/76   Pulse 70   Temp 97.7 F (36.5 C) (Oral)   Resp 14   Wt 180 lb 9.6 oz (81.9 kg)   SpO2 97%   BMI 30.29 kg/m   BP/Weight 07/10/2016 12/03/9526 10/02/3242  Systolic BP 010 272 536  Diastolic BP 76 68 62  Wt. (Lbs) 180.6 184.25 185.75  BMI 30.29 30.88 31.14   Physical Exam  Constitutional: She is oriented to person, place, and time. She appears well-developed. No distress.  Cardiovascular: Normal rate and regular rhythm.     Pulmonary/Chest: Effort normal and breath sounds normal.  Neurological: She is alert and oriented to person, place, and time.  Psychiatric: She has a normal mood and affect.  Vitals reviewed.  Lab Results  Component Value Date   WBC 4.4 01/06/2016   HGB 12.4 01/06/2016   HCT 36.6 01/06/2016   PLT 192.0 01/06/2016   GLUCOSE 165 (H) 01/06/2016   CHOL 195 07/08/2015   TRIG 211.0 (H) 07/08/2015   HDL 33.80 (L) 07/08/2015   LDLDIRECT 114.0 07/08/2015   LDLCALC 96 02/02/2015   ALT 13 07/08/2015   AST 15 07/08/2015   NA 139 01/06/2016   K 5.0 01/06/2016   CL 104 01/06/2016   CREATININE 1.36 (H) 01/06/2016   BUN 20 01/06/2016   CO2 28 01/06/2016   HGBA1C 7.8 (H) 01/06/2016   MICROALBUR 12.3 (H) 08/03/2014   Assessment & Plan:   Problem List Items Addressed This Visit    HLD (hyperlipidemia)    Lipid panel today. Continue lipitor.      Relevant Orders   Lipid panel   Essential hypertension    At goal. Continue Norvasc and amlodipine.      DM type 2 (diabetes mellitus, type 2) ([place]) - Primary    Reasonably well controlled given age. Continuing metformin, tradjenta and jardiance (will  provide samples for her). A1c today.      Relevant Orders   Hemoglobin Y8L   Basic Metabolic Panel (BMET)     Follow-up: Return in about 3 months (around 10/08/2016).  Why

## 2016-07-10 NOTE — Assessment & Plan Note (Signed)
Lipid panel today Continue lipitor 

## 2016-07-10 NOTE — Patient Instructions (Addendum)
Continue your meds.  We will take care of your jardiance.  Follow up in 3 months.  Take care  Dr. Lacinda Axon

## 2016-07-10 NOTE — Assessment & Plan Note (Signed)
Reasonably well controlled given age. Continuing metformin, tradjenta and jardiance (will provide samples for her). A1c today.

## 2016-07-10 NOTE — Assessment & Plan Note (Signed)
At goal. Continue Norvasc and amlodipine.

## 2016-08-02 ENCOUNTER — Other Ambulatory Visit: Payer: Self-pay | Admitting: Family Medicine

## 2016-08-02 DIAGNOSIS — E119 Type 2 diabetes mellitus without complications: Secondary | ICD-10-CM

## 2016-08-14 ENCOUNTER — Telehealth: Payer: Self-pay | Admitting: *Deleted

## 2016-08-14 NOTE — Telephone Encounter (Signed)
Pt was called and jardiance was to expensive. She would like samples.

## 2016-08-14 NOTE — Telephone Encounter (Signed)
She may come in and get samples.

## 2016-08-14 NOTE — Telephone Encounter (Signed)
Pt requested a call in reference to receiving jardiance  Pt contact [phone]

## 2016-08-24 ENCOUNTER — Encounter (INDEPENDENT_AMBULATORY_CARE_PROVIDER_SITE_OTHER): Payer: Medicare Other | Admitting: Ophthalmology

## 2016-08-24 DIAGNOSIS — E113312 Type 2 diabetes mellitus with moderate nonproliferative diabetic retinopathy with macular edema, left eye: Secondary | ICD-10-CM | POA: Diagnosis not present

## 2016-08-24 DIAGNOSIS — E11311 Type 2 diabetes mellitus with unspecified diabetic retinopathy with macular edema: Secondary | ICD-10-CM | POA: Diagnosis not present

## 2016-08-24 DIAGNOSIS — E113291 Type 2 diabetes mellitus with mild nonproliferative diabetic retinopathy without macular edema, right eye: Secondary | ICD-10-CM

## 2016-08-24 DIAGNOSIS — H43813 Vitreous degeneration, bilateral: Secondary | ICD-10-CM | POA: Diagnosis not present

## 2016-08-24 DIAGNOSIS — H35033 Hypertensive retinopathy, bilateral: Secondary | ICD-10-CM

## 2016-08-24 DIAGNOSIS — I10 Essential (primary) hypertension: Secondary | ICD-10-CM | POA: Diagnosis not present

## 2016-09-14 ENCOUNTER — Other Ambulatory Visit: Payer: Self-pay | Admitting: Family Medicine

## 2016-09-14 MED ORDER — GLUCOSE BLOOD VI STRP: 1.0000 | ORAL_STRIP | Freq: Two times a day (BID) | 5 refills | Status: DC

## 2016-09-14 MED ORDER — BLOOD GLUCOSE METER KIT: PACK | 0 refills | Status: DC

## 2016-09-14 MED ORDER — ONETOUCH ULTRASOFT LANCETS MISC: 6 refills | Status: DC

## 2016-10-10 ENCOUNTER — Ambulatory Visit (INDEPENDENT_AMBULATORY_CARE_PROVIDER_SITE_OTHER): Payer: Medicare Other | Admitting: Family Medicine

## 2016-10-10 VITALS — BP 126/70 | HR 70 | Temp 97.7°F | Wt 179.5 lb

## 2016-10-10 DIAGNOSIS — N183 Chronic kidney disease, stage 3 unspecified: Secondary | ICD-10-CM

## 2016-10-10 DIAGNOSIS — E1122 Type 2 diabetes mellitus with diabetic chronic kidney disease: Secondary | ICD-10-CM | POA: Diagnosis not present

## 2016-10-10 DIAGNOSIS — I10 Essential (primary) hypertension: Secondary | ICD-10-CM

## 2016-10-10 DIAGNOSIS — E785 Hyperlipidemia, unspecified: Secondary | ICD-10-CM

## 2016-10-10 LAB — POCT GLYCOSYLATED HEMOGLOBIN (HGB A1C): Hemoglobin A1C: 9.1

## 2016-10-10 MED ORDER — EMPAGLIFLOZIN 10 MG PO TABS: 10.0000 mg | ORAL_TABLET | Freq: Every day | ORAL | 0 refills | Status: DC

## 2016-10-10 NOTE — Patient Instructions (Signed)
Increase the metformin to 2 pills in the am and 2 pills in the PM.  Watch your fruit intake.  We will call when I have your Jardiance.  Take care  Dr. Lacinda Axon

## 2016-10-10 NOTE — Assessment & Plan Note (Signed)
Not at goal. Continue Lipitor. Will likely need addition of Zetia.

## 2016-10-10 NOTE — Progress Notes (Signed)
Pre visit review using our clinic review tool, if applicable. No additional management support is needed unless otherwise documented below in the visit note. 

## 2016-10-10 NOTE — Assessment & Plan Note (Signed)
Established problem, worsening. A1c 9.1 today. Increasing metformin to total 2000 mg daily (this is okay per GFR). Will increased shortness 25 mg daily once I have samples.

## 2016-10-10 NOTE — Progress Notes (Signed)
   Subjective:  [name], female    DOB: 1933/08/02  Age: 83 y.o. MRN: [id number]  CC: Follow up  HPI:  83 year old female with hypertension, DM 2, CKD stage III, hyperlipidemia presents for follow-up.  HTN  At goal on Norvasc and lisinopril.  HLD  On lipitor.   Last LDL was 122.  DM-2  Needs A1C today.  CBG this am was 165.  Compliant with Metformin, Tradjenta and Jardiance.  Social Hx   Social History   Social History  . Marital status: Married    Spouse name: N/A  . Number of children: 2  . Years of education: N/A   Occupational History  . 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 Smoking status: Never Smoker  . Smokeless tobacco: Not on file  . Alcohol use No  . Drug use: No  . Sexual activity: No   Other Topics Concern  . Not on file   Social History Narrative   She is married and has two adult children (adopted).    Hobbies: Decorating   Exercise: None   Caffeine: 4 cups a week    Review of Systems  Respiratory: Negative.   Cardiovascular: Negative.    Objective:  BP 126/70   Pulse 70   Temp 97.7 F (36.5 C) (Oral)   Wt 179 lb 8 oz (81.4 kg)   SpO2 96%   BMI 30.10 kg/m   BP/Weight 10/10/2016 12/08/1273 07/10/15  Systolic BP 494 496 759  Diastolic BP 70 76 68  Wt. (Lbs) 179.5 180.6 184.25  BMI 30.1 30.29 30.88   Physical Exam  Constitutional: She is oriented to person, place, and time. She appears well-developed. No distress.  Cardiovascular: Normal rate and regular rhythm.   Pulmonary/Chest: Effort normal and breath sounds normal.  Neurological: She is alert and oriented to person, place, and time.  Psychiatric: She has a normal mood and affect.  Vitals reviewed.   Lab Results  Component Value Date   WBC 4.4 01/06/2016   HGB 12.4 01/06/2016   HCT 36.6 01/06/2016   PLT 192.0 01/06/2016   GLUCOSE 232 (H) 07/10/2016   CHOL 194 07/10/2016   TRIG 164.0 (H) 07/10/2016   HDL  39.00 (L) 07/10/2016   LDLDIRECT 114.0 07/08/2015   LDLCALC 122 (H) 07/10/2016   ALT 13 07/08/2015   AST 15 07/08/2015   NA 137 07/10/2016   K 4.5 07/10/2016   CL 104 07/10/2016   CREATININE 1.31 (H) 07/10/2016   BUN 23 07/10/2016   CO2 23 07/10/2016   HGBA1C 9.1 10/10/2016   MICROALBUR 12.3 (H) 08/03/2014    Assessment & Plan:   Problem List Items Addressed This Visit    HLD (hyperlipidemia)    Not at goal. Continue Lipitor. Will likely need addition of Zetia.      Essential hypertension    At goal. Continue Lisinopril and Norvasc.      DM type 2 (diabetes mellitus, type 2) ([place]) - Primary    Established problem, worsening. A1c 9.1 today. Increasing metformin to total 2000 mg daily (this is okay per GFR). Will increased shortness 25 mg daily once I have samples.      Relevant Orders   POCT HgB A1C (Completed)     Follow-up: Return in about 3 months (around 01/09/2017).  [place]

## 2016-10-10 NOTE — Assessment & Plan Note (Signed)
At goal. Continue Lisinopril and Norvasc.

## 2016-10-12 ENCOUNTER — Encounter (INDEPENDENT_AMBULATORY_CARE_PROVIDER_SITE_OTHER): Payer: Medicare Other | Admitting: Ophthalmology

## 2016-10-12 DIAGNOSIS — H43813 Vitreous degeneration, bilateral: Secondary | ICD-10-CM | POA: Diagnosis not present

## 2016-10-12 DIAGNOSIS — E113312 Type 2 diabetes mellitus with moderate nonproliferative diabetic retinopathy with macular edema, left eye: Secondary | ICD-10-CM | POA: Diagnosis not present

## 2016-10-12 DIAGNOSIS — E11311 Type 2 diabetes mellitus with unspecified diabetic retinopathy with macular edema: Secondary | ICD-10-CM

## 2016-10-12 DIAGNOSIS — I10 Essential (primary) hypertension: Secondary | ICD-10-CM | POA: Diagnosis not present

## 2016-10-12 DIAGNOSIS — H35033 Hypertensive retinopathy, bilateral: Secondary | ICD-10-CM

## 2016-10-12 DIAGNOSIS — E113291 Type 2 diabetes mellitus with mild nonproliferative diabetic retinopathy without macular edema, right eye: Secondary | ICD-10-CM

## 2016-10-30 ENCOUNTER — Telehealth: Payer: Self-pay | Admitting: *Deleted

## 2016-10-30 NOTE — Telephone Encounter (Signed)
Patient requested to know if she could pick up samples for jardiance  Pt contact [phone]

## 2016-10-30 NOTE — Telephone Encounter (Signed)
Pt informed to come by around 1130.

## 2016-11-02 DIAGNOSIS — E113393 Type 2 diabetes mellitus with moderate nonproliferative diabetic retinopathy without macular edema, bilateral: Secondary | ICD-10-CM | POA: Diagnosis not present

## 2016-11-02 DIAGNOSIS — Z961 Presence of intraocular lens: Secondary | ICD-10-CM | POA: Diagnosis not present

## 2016-11-02 DIAGNOSIS — H40013 Open angle with borderline findings, low risk, bilateral: Secondary | ICD-10-CM | POA: Diagnosis not present

## 2016-11-02 DIAGNOSIS — H35033 Hypertensive retinopathy, bilateral: Secondary | ICD-10-CM | POA: Diagnosis not present

## 2016-11-07 ENCOUNTER — Ambulatory Visit (INDEPENDENT_AMBULATORY_CARE_PROVIDER_SITE_OTHER): Payer: Medicare Other

## 2016-11-07 VITALS — BP 128/62 | HR 60 | Temp 97.8°F | Resp 12 | Ht 65.0 in | Wt 182.2 lb

## 2016-11-07 DIAGNOSIS — Z1389 Encounter for screening for other disorder: Secondary | ICD-10-CM

## 2016-11-07 DIAGNOSIS — Z0000 Encounter for general adult medical examination without abnormal findings: Secondary | ICD-10-CM

## 2016-11-07 NOTE — Patient Instructions (Addendum)
  Ms. Mcclary , Thank you for taking time to come for your Medicare Wellness Visit. I appreciate your ongoing commitment to your health goals. Please review the following plan we discussed and let me know if I can assist you in the future.   Follow up with Dr. Lacinda Axon as needed.    Have a great day!  These are the goals we discussed: Goals    . Healthy Lifestyle          Low carb foods Stay hydrated and drink plenty of fluids Stay active  Monitor blood sugar daily       This is a list of the screening recommended for you and due dates:  Health Maintenance  Topic Date Due  . Flu Shot  07/07/2017*  . DEXA scan (bone density measurement)  07/07/2017*  . Tetanus Vaccine  11/30/2017*  . Pneumonia vaccines (1 of 2 - PCV13) 11/30/2017*  . Complete foot exam   01/05/2017  . Hemoglobin A1C  04/11/2017  . Eye exam for diabetics  06/02/2017  *Topic was postponed. The date shown is not the original due date.

## 2016-11-07 NOTE — Progress Notes (Signed)
Subjective:   Brianna Dodson is a 83 y.o. female who presents for Medicare Annual (Subsequent) preventive examination.  Review of Systems:  No ROS.  Medicare Wellness Visit.  Cardiac Risk Factors include: advanced age (>46mn, >>52women);hypertension;diabetes mellitus     Objective:     Vitals: BP 128/62 (BP Location: Left Arm, Patient Position: Sitting, Cuff Size: Normal)   Pulse 60   Temp 97.8 F (36.6 C) (Oral)   Resp 12   Wt 182 lb 3.2 oz (82.6 kg)   SpO2 96%   BMI 30.55 kg/m   Body mass index is 30.55 kg/m.   Tobacco History  Smoking Status  . Never Smoker  Smokeless Tobacco  . Never Used     Counseling given: Not Answered   Past Medical History:  Diagnosis Date  . CKD (chronic kidney disease), stage III   . [place]   . History of blood transfusion   . Hyperlipidemia   . Hypertension    Past Surgical History:  Procedure Laterality Date  . ABDOMINAL HYSTERECTOMY  1985   Family History  Problem Relation Age of Onset  . Stroke Mother   . Diabetes Mother   . Heart disease Father   . Kidney disease Sister   . Diabetes Sister   . Kidney disease Brother   . Heart disease Sister   . Diabetes Sister   . Diabetes Sister   . Diabetes Sister   . Kidney disease Sister   . Heart disease Sister   . Kidney disease Brother     kidney transplant  . Early death Brother 368   Truck Accident - died  . Heart disease Brother    History  Sexual Activity  . Sexual activity: No    Outpatient Encounter Prescriptions as of 11/07/2016  Medication Sig  . amLODipine (NORVASC) 10 MG tablet TAKE 1 TABLET (10 MG TOTAL) BY MOUTH DAILY.  .Marland Kitchenaspirin 81 MG tablet Take 81 mg by mouth daily.  .Marland Kitchenatorvastatin (LIPITOR) 80 MG tablet Take 1 tablet (80 mg total) by mouth daily.  . blood glucose meter kit and supplies Dispense based on patient and insurance preference. Use up to four times daily as directed. E11.9  . glucose blood (ONETOUCH  VERIO) test strip 1 each by Other route 2 (two) times daily.  .Marland KitchenJARDIANCE 10 MG TABS tablet TAKE 1 TABLET BY MOUTH DAILY.  .Marland KitchenLancets (ONETOUCH ULTRASOFT) lancets Use as instructed  . lisinopril (PRINIVIL,ZESTRIL) 40 MG tablet TAKE 1 TABLET (40 MG TOTAL) BY MOUTH DAILY.  . metFORMIN (GLUCOPHAGE) 500 MG tablet TAKE 1 TABLET (500 MG TOTAL) BY MOUTH 2 (TWO) TIMES DAILY WITH A MEAL.  .Marland KitchenTRADJENTA 5 MG TABS tablet TAKE 1 TABLET BY MOUTH DAILY.  .Marland KitchenOmega-3 Fatty Acids (FISH OIL) 1000 MG CAPS Take 1,000 mg by mouth.   No facility-administered encounter medications on file as of 11/07/2016.     Activities of Daily Living In your present state of health, do you have any difficulty performing the following activities: 11/07/2016  Hearing? N  Vision? N  Difficulty concentrating or making decisions? N  Walking or climbing stairs? N  Dressing or bathing? N  Doing errands, shopping? N  Preparing Food and eating ? N  Using the Toilet? N  In the past six months, have you accidently leaked urine? N  Do you have problems with loss of bowel control? N  Managing your Medications? N  Managing your Finances? N  Housekeeping  or managing your Housekeeping? N  Some recent data might be hidden    Patient Care Team: Coral Spikes, DO as PCP - General (Family Medicine)    Assessment:    This is a routine wellness examination for [name]. The goal of the wellness visit is to assist the patient how to close the gaps in care and create a preventative care plan for the patient.   Osteoporosis risk reviewed.  Medications reviewed; taking without issues or barriers.  Safety issues reviewed; smoke detectors in the home. No firearms in the home.  Wears seatbelts when driving or riding with others. Patient does wear sunscreen or protective clothing when in direct sunlight. No violence in the home.  Depression- PHQ 2 &9 complete.  No signs/symptoms or verbal communication regarding little pleasure in doing things,  feeling down, depressed or hopeless. No changes in sleeping, energy, eating, concentrating.  No thoughts of self harm or harm towards others.  Time spent on this topic is 8 minutes.   Patient is alert, normal appearance, oriented to person/place/and time. Correctly identified the president of the [place], recall of 3/3 words, and performing simple calculations.  Patient displays appropriate judgement and can read correct time from watch face.  No new identified risk were noted.  No failures at ADL's or IADL's.   BMI- discussed the importance of a healthy diet, water intake and exercise. Educational material provided.   HTN- followed by PCP.  Dental- every six months.  Eye- Visual acuity not assessed per patient preference since they have regular follow up with the ophthalmologist.  Wears corrective lenses.  Sleep patterns- Sleeps 7-8 hours at night.  Prevnar 13 and TDAP vaccine declined per patient preference.  Educational material provided.  Health maintenance gaps- closed.  Patient Concerns: None at this time. Follow up with PCP as needed.  Exercise Activities and Dietary recommendations Current Exercise Habits: Structured exercise class, Type of exercise: yoga, Time (Minutes): 45, Frequency (Times/Week): 2, Weekly Exercise (Minutes/Week): 90, Intensity: Mild  Goals    . Healthy Lifestyle          Low carb foods Stay hydrated and drink plenty of fluids Stay active  Monitor blood sugar daily      Fall Risk Fall Risk  11/07/2016 07/10/2016 02/02/2015 08/03/2014 01/29/2014  Falls in the past year? No No No No No   Depression Screen PHQ 2/9 Scores 11/07/2016 07/10/2016 02/02/2015 08/03/2014  PHQ - 2 Score 0 0 0 0  PHQ- 9 Score 0 - - -     Cognitive Function MMSE - Mini Mental State Exam 11/07/2016  Orientation to time 5  Orientation to Place 5  Registration 3  Attention/ Calculation 5  Recall 3  Language- name 2 objects 2  Language- repeat 1  Language- follow 3 step command 3    Language- read & follow direction 1  Write a sentence 1  Copy design 1  Total score 30         There is no immunization history on file for this patient. Screening Tests Health Maintenance  Topic Date Due  . INFLUENZA VACCINE  07/07/2017 (Originally 01/31/2017)  . DEXA SCAN  07/07/2017 (Originally 10/05/2000)  . TETANUS/TDAP  11/30/2017 (Originally 10/06/1952)  . PNA vac Low Risk Adult (1 of 2 - PCV13) 11/30/2017 (Originally 10/05/2000)  . FOOT EXAM  01/05/2017  . HEMOGLOBIN A1C  04/11/2017  . OPHTHALMOLOGY EXAM  06/02/2017      Plan:   End of life planning; Advanced aging; Advanced  directives discussed.  No HCPOA/Living Will.  Additional information declined at this time.  I have personally reviewed and noted the following in the patient's chart:   . Medical and social history . Use of alcohol, tobacco or illicit drugs  . Current medications and supplements . Functional ability and status . Nutritional status . Physical activity . Advanced directives . List of other physicians . Hospitalizations, surgeries, and ER visits in previous 12 months . Vitals . Screenings to include cognitive, depression, and falls . Referrals and appointments  In addition, I have reviewed and discussed with patient certain preventive protocols, quality metrics, and best practice recommendations. A written personalized care plan for preventive services as well as general preventive health recommendations were provided to patient.     Varney Biles, LPN  0/0/4159

## 2016-11-07 NOTE — Progress Notes (Signed)
Care was provided under my supervision. I agree with the management as indicated in the note.  Luda Charbonneau DO  

## 2016-12-07 ENCOUNTER — Encounter (INDEPENDENT_AMBULATORY_CARE_PROVIDER_SITE_OTHER): Payer: Medicare Other | Admitting: Ophthalmology

## 2016-12-07 DIAGNOSIS — E113291 Type 2 diabetes mellitus with mild nonproliferative diabetic retinopathy without macular edema, right eye: Secondary | ICD-10-CM | POA: Diagnosis not present

## 2016-12-07 DIAGNOSIS — H35033 Hypertensive retinopathy, bilateral: Secondary | ICD-10-CM | POA: Diagnosis not present

## 2016-12-07 DIAGNOSIS — E11311 Type 2 diabetes mellitus with unspecified diabetic retinopathy with macular edema: Secondary | ICD-10-CM

## 2016-12-07 DIAGNOSIS — I10 Essential (primary) hypertension: Secondary | ICD-10-CM | POA: Diagnosis not present

## 2016-12-07 DIAGNOSIS — E113312 Type 2 diabetes mellitus with moderate nonproliferative diabetic retinopathy with macular edema, left eye: Secondary | ICD-10-CM | POA: Diagnosis not present

## 2016-12-07 DIAGNOSIS — H43813 Vitreous degeneration, bilateral: Secondary | ICD-10-CM | POA: Diagnosis not present

## 2016-12-28 DIAGNOSIS — E113393 Type 2 diabetes mellitus with moderate nonproliferative diabetic retinopathy without macular edema, bilateral: Secondary | ICD-10-CM | POA: Diagnosis not present

## 2016-12-28 DIAGNOSIS — H35033 Hypertensive retinopathy, bilateral: Secondary | ICD-10-CM | POA: Diagnosis not present

## 2016-12-28 DIAGNOSIS — H40013 Open angle with borderline findings, low risk, bilateral: Secondary | ICD-10-CM | POA: Diagnosis not present

## 2016-12-28 DIAGNOSIS — Z961 Presence of intraocular lens: Secondary | ICD-10-CM | POA: Diagnosis not present

## 2017-01-08 DIAGNOSIS — E875 Hyperkalemia: Secondary | ICD-10-CM | POA: Diagnosis not present

## 2017-01-08 DIAGNOSIS — E1122 Type 2 diabetes mellitus with diabetic chronic kidney disease: Secondary | ICD-10-CM | POA: Diagnosis not present

## 2017-01-08 DIAGNOSIS — R809 Proteinuria, unspecified: Secondary | ICD-10-CM | POA: Diagnosis not present

## 2017-01-08 DIAGNOSIS — N183 Chronic kidney disease, stage 3 (moderate): Secondary | ICD-10-CM | POA: Diagnosis not present

## 2017-01-08 DIAGNOSIS — I129 Hypertensive chronic kidney disease with stage 1 through stage 4 chronic kidney disease, or unspecified chronic kidney disease: Secondary | ICD-10-CM | POA: Diagnosis not present

## 2017-01-09 ENCOUNTER — Encounter: Payer: Self-pay | Admitting: Family Medicine

## 2017-01-09 ENCOUNTER — Ambulatory Visit (INDEPENDENT_AMBULATORY_CARE_PROVIDER_SITE_OTHER): Payer: Medicare Other | Admitting: Family Medicine

## 2017-01-09 DIAGNOSIS — N183 Chronic kidney disease, stage 3 unspecified: Secondary | ICD-10-CM

## 2017-01-09 DIAGNOSIS — E1122 Type 2 diabetes mellitus with diabetic chronic kidney disease: Secondary | ICD-10-CM

## 2017-01-09 LAB — POCT GLYCOSYLATED HEMOGLOBIN (HGB A1C): Hemoglobin A1C: 8.5

## 2017-01-09 MED ORDER — EMPAGLIFLOZIN 10 MG PO TABS: 10.0000 mg | ORAL_TABLET | Freq: Every day | ORAL | 1 refills | Status: DC

## 2017-01-09 NOTE — Progress Notes (Signed)
Subjective:  [name], female    DOB: [birth year]  Age: 83 y.o. MRN: [id number]  CC: Follow up   HPI:  83 year old female with type 2 diabetes, CKD stage III, hypertension, hyperlipidemia presents for follow-up regarding her diabetes.  DM 2  Patient states that her fasting blood sugars are well controlled. She states that they're typically in the 140s.  She endorses compliance with Jardiance, Tradjenta, and metformin.  No adverse side effects.  Most recent GFR was 41 (7/9 via labs from nephrology).  Patient reports that she's changed her diet but she continues to drink juice.  Social Hx   Social History   Social History  . Marital status: Married    Spouse name: N/A  . Number of children: 2  . Years of education: N/A   Occupational History  . 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 Smoking status: Never Smoker  . Smokeless tobacco: Never Used  . Alcohol use No  . Drug use: No  . Sexual activity: No   Other Topics Concern  . None   Social History Narrative   She is married and has two adult children (adopted).    Hobbies: Decorating   Exercise: None   Caffeine: 4 cups a week    Review of Systems  Constitutional: Negative.   Endocrine: Negative for polydipsia and polyuria.   Objective:  BP 124/66 (BP Location: Left Arm, Patient Position: Sitting, Cuff Size: Normal)   Pulse 70   Temp 98 F (36.7 C) (Oral)   Resp 16   Wt 183 lb 8 oz (83.2 kg)   SpO2 95%   BMI 30.54 kg/m   BP/Weight 01/09/2017 11/07/2016 7/42/5956  Systolic BP 387 564 332  Diastolic BP 66 62 70  Wt. (Lbs) 183.5 182.2 179.5  BMI 30.54 30.32 30.1    Physical Exam  Constitutional: She is oriented to person, place, and time. She appears well-developed. No distress.  Cardiovascular: Normal rate and regular rhythm.   Pulmonary/Chest: Effort normal and breath sounds normal.  Neurological: She is alert and oriented to person, place,  and time.  Psychiatric: She has a normal mood and affect.  Vitals reviewed.   Lab Results  Component Value Date   WBC 4.4 01/06/2016   HGB 12.4 01/06/2016   HCT 36.6 01/06/2016   PLT 192.0 01/06/2016   GLUCOSE 232 (H) 07/10/2016   CHOL 194 07/10/2016   TRIG 164.0 (H) 07/10/2016   HDL 39.00 (L) 07/10/2016   LDLDIRECT 114.0 07/08/2015   LDLCALC 122 (H) 07/10/2016   ALT 13 07/08/2015   AST 15 07/08/2015   NA 137 07/10/2016   K 4.5 07/10/2016   CL 104 07/10/2016   CREATININE 1.31 (H) 07/10/2016   BUN 23 07/10/2016   CO2 23 07/10/2016   HGBA1C 8.5 01/09/2017   MICROALBUR 12.3 (H) 08/03/2014    Assessment & Plan:   Problem List Items Addressed This Visit      Endocrine   DM type 2 (diabetes mellitus, type 2) ([facility])    A1c 8.5 today. Her sugars are improving but still not at goal. I had a lengthy discussion with her today about treatment options: Continued current medical therapy with a goal A1c of 8.5 or less given age, aggressive treatment with addition of insulin, and aggressive treatment with addition of Trulucity. Patient elected to continue with her current medical therapy. She does not want injection therapy. I cannot increase  her Jardiance or metformin safely given her chronic kidney disease. Will follow-up in 3 months. Advised her against sweetened beverages and excessive sugars.      Relevant Medications   empagliflozin (JARDIANCE) 10 MG TABS tablet   Other Relevant Orders   POCT glycosylated hemoglobin (Hb A1C) (Completed)      Meds ordered this encounter  Medications  . empagliflozin (JARDIANCE) 10 MG TABS tablet    Sig: Take 10 mg by mouth daily.    Dispense:  90 tablet    Refill:  1   Follow-up: Return in about 3 months (around 04/11/2017) for Diabetes follow up.  15 minutes were spent face-to-face with the patient during this encounter and over half of that time was spent on counseling regarding treatment goal of her diabetes, additional therapeutic  options including insulin and Trulucity.  [place]

## 2017-01-09 NOTE — Patient Instructions (Signed)
No changes.  Follow up in 3 months.  Take care  Dr. Lacinda Axon

## 2017-01-09 NOTE — Assessment & Plan Note (Signed)
A1c 8.5 today. Her sugars are improving but still not at goal. I had a lengthy discussion with her today about treatment options: Continued current medical therapy with a goal A1c of 8.5 or less given age, aggressive treatment with addition of insulin, and aggressive treatment with addition of Trulucity. Patient elected to continue with her current medical therapy. She does not want injection therapy. I cannot increase her Jardiance or metformin safely given her chronic kidney disease. Will follow-up in 3 months. Advised her against sweetened beverages and excessive sugars.

## 2017-01-31 ENCOUNTER — Telehealth: Payer: Self-pay | Admitting: Family Medicine

## 2017-01-31 NOTE — Telephone Encounter (Signed)
Zack from Cover my meds called and was following up on an appeal for pt's Jardiance. Please advise, thank you!  Call [phone] Ref# [id number]

## 2017-01-31 NOTE — Telephone Encounter (Signed)
Prior authorization completed via cover my meds.

## 2017-02-05 ENCOUNTER — Encounter: Payer: Self-pay | Admitting: Family Medicine

## 2017-02-15 ENCOUNTER — Telehealth: Payer: Self-pay | Admitting: Family Medicine

## 2017-02-15 NOTE — Telephone Encounter (Signed)
Faxed office notes to [place] again awaiting on answer.

## 2017-02-15 NOTE — Telephone Encounter (Addendum)
Received Appeal letter from insurance they state Brianna Dodson has been determined as not medically necessary.   Patient must try formulary alternatives in class 2 [place] and Invokana.  See letter for review in your folder.

## 2017-02-15 NOTE — Telephone Encounter (Signed)
Patient in office today requesting appeal be filed for Brianna Dodson, spoke with Cyril Mourning appeal was fied with [facility] on 02/12/17, awaiting appeal decision at this time . Patient has 30 day supply of Jardiance at this time. Notified patient  Appeal has been filed. FYI

## 2017-02-22 ENCOUNTER — Other Ambulatory Visit: Payer: Self-pay | Admitting: Family Medicine

## 2017-02-22 NOTE — Telephone Encounter (Signed)
Patient into office concerning Jardiance denial patient was grateful for the samples and ask would you please call in [place] for [facility] supply [facility].

## 2017-02-26 ENCOUNTER — Telehealth: Payer: Self-pay | Admitting: Family Medicine

## 2017-02-26 NOTE — Telephone Encounter (Signed)
Pa restarted on [employer] for Jardiance 10 mg, Used Farxiga  contraindicated in patient with GFR less than 60 patient GFR 50.14 , for Invokanna used medication contraindicated in patient with low vitamin D due to increased risk of fracture. These are the only two reasons I could use to discredit why patient could not take one of these medications will have to wait for decision n 1-5 days.  Kellyn Mings (Key: [id number])

## 2017-03-02 ENCOUNTER — Telehealth: Payer: Self-pay | Admitting: *Deleted

## 2017-03-02 NOTE — Telephone Encounter (Signed)
[employer] will send ob Script for [employer]

## 2017-03-08 ENCOUNTER — Encounter (INDEPENDENT_AMBULATORY_CARE_PROVIDER_SITE_OTHER): Payer: Medicare Other | Admitting: Ophthalmology

## 2017-03-08 DIAGNOSIS — H43813 Vitreous degeneration, bilateral: Secondary | ICD-10-CM | POA: Diagnosis not present

## 2017-03-08 DIAGNOSIS — I10 Essential (primary) hypertension: Secondary | ICD-10-CM | POA: Diagnosis not present

## 2017-03-08 DIAGNOSIS — E113393 Type 2 diabetes mellitus with moderate nonproliferative diabetic retinopathy without macular edema, bilateral: Secondary | ICD-10-CM | POA: Diagnosis not present

## 2017-03-08 DIAGNOSIS — E11319 Type 2 diabetes mellitus with unspecified diabetic retinopathy without macular edema: Secondary | ICD-10-CM

## 2017-03-08 DIAGNOSIS — H35033 Hypertensive retinopathy, bilateral: Secondary | ICD-10-CM | POA: Diagnosis not present

## 2017-04-12 ENCOUNTER — Ambulatory Visit: Payer: Commercial Indemnity | Admitting: Family Medicine

## 2017-04-17 ENCOUNTER — Encounter: Payer: Self-pay | Admitting: Family Medicine

## 2017-04-17 ENCOUNTER — Ambulatory Visit (INDEPENDENT_AMBULATORY_CARE_PROVIDER_SITE_OTHER): Payer: Medicare Other | Admitting: Family Medicine

## 2017-04-17 VITALS — BP 122/68 | HR 73 | Temp 97.6°F | Wt 178.4 lb

## 2017-04-17 DIAGNOSIS — I10 Essential (primary) hypertension: Secondary | ICD-10-CM

## 2017-04-17 DIAGNOSIS — D229 Melanocytic nevi, unspecified: Secondary | ICD-10-CM

## 2017-04-17 DIAGNOSIS — E785 Hyperlipidemia, unspecified: Secondary | ICD-10-CM | POA: Diagnosis not present

## 2017-04-17 DIAGNOSIS — N183 Chronic kidney disease, stage 3 unspecified: Secondary | ICD-10-CM

## 2017-04-17 DIAGNOSIS — E1122 Type 2 diabetes mellitus with diabetic chronic kidney disease: Secondary | ICD-10-CM | POA: Diagnosis not present

## 2017-04-17 LAB — COMPREHENSIVE METABOLIC PANEL
ALT: 17 U/L (ref 0–35)
AST: 21 U/L (ref 0–37)
Albumin: 3.9 g/dL (ref 3.5–5.2)
Alkaline Phosphatase: 49 U/L (ref 39–117)
BUN: 27 mg/dL — ABNORMAL HIGH (ref 6–23)
CO2: 26 mEq/L (ref 19–32)
Calcium: 9.5 mg/dL (ref 8.4–10.5)
Chloride: 102 mEq/L (ref 96–112)
Creatinine, Ser: 1.46 mg/dL — ABNORMAL HIGH (ref 0.40–1.20)
GFR: 44.16 mL/min — ABNORMAL LOW (ref >60.00)
Glucose, Bld: 324 mg/dL — ABNORMAL HIGH (ref 70–99)
Potassium: 4.5 mEq/L (ref 3.5–5.1)
Sodium: 136 mEq/L (ref 135–145)
Total Bilirubin: 0.7 mg/dL (ref 0.2–1.2)
Total Protein: 7.6 g/dL (ref 6.0–8.3)

## 2017-04-17 LAB — LDL CHOLESTEROL, DIRECT: Direct LDL: 143 mg/dL

## 2017-04-17 LAB — HEMOGLOBIN A1C: Hgb A1c MFr Bld: 8.8 % — ABNORMAL HIGH (ref 4.6–6.5)

## 2017-04-17 NOTE — Assessment & Plan Note (Signed)
Patient notes this has been unchanged for some time. Suspect benign lesion given that it has been unchanged for many years. I did discuss having her see dermatology given the slight irregularity of the borders though she declined. I advised that she needed to let us know if it changed in any manner.

## 2017-04-17 NOTE — Patient Instructions (Addendum)
Nice to see you. We are going to check lab work today and contact with results. I'll check with our pharmacist regarding your medications.

## 2017-04-17 NOTE — Assessment & Plan Note (Signed)
Check direct LDL. Continue Lipitor.

## 2017-04-17 NOTE — Progress Notes (Signed)
  [name], MD Phone: [phone]  Brianna Dodson is a 83 y.o. female who presents today for f/u.  HYPERTENSION Disease Monitoring: Blood pressure range-not checking Chest pain- no      Dyspnea- no Medications: Compliance- taking amlodipine, lisinopril   Edema- no  DIABETES Disease Monitoring: Blood Sugar ranges-140-160 Polyuria/phagia/dipsia- no      optho-saw in september Medications: Compliance- taking tradjenta, jardiance, metformin Hypoglycemic symptoms- no  HYPERLIPIDEMIA Disease Monitoring: See symptoms for Hypertension Medications: Compliance- taking lipitor Right upper quadrant pain- no  Muscle aches- no    PMH: nonsmoker.   ROS see history of present illness  Objective  Physical Exam Vitals:   04/17/17 0942  BP: 122/68  Pulse: 73  Temp: 97.6 F (36.4 C)  SpO2: 95%    BP Readings from Last 3 Encounters:  04/17/17 122/68  01/09/17 124/66  11/07/16 128/62   Wt Readings from Last 3 Encounters:  04/17/17 178 lb 6.4 oz (80.9 kg)  01/09/17 183 lb 8 oz (83.2 kg)  11/07/16 182 lb 3.2 oz (82.6 kg)    Physical Exam  Constitutional: No distress.  Cardiovascular: Normal rate, regular rhythm and normal heart sounds.   Pulmonary/Chest: Effort normal and breath sounds normal.  Musculoskeletal: She exhibits no edema.  Neurological: She is alert. Gait normal.  Skin: Skin is warm and dry. She is not diaphoretic.   Diabetic Foot Exam - Simple   Simple Foot Form Diabetic Foot exam was performed with the following findings:  Yes 04/17/2017 10:06 AM  Visual Inspection No deformities, no ulcerations, no other skin breakdown bilaterally:  Yes Sensation Testing Intact to touch and monofilament testing bilaterally:  Yes Pulse Check Posterior Tibialis and Dorsalis pulse intact bilaterally:  Yes Comments Patient does have a nevus versus seborrheic keratosis on the dorsum of her left foot, it is dark in color, uniform in color, does have slight irregularity  of the borders, patient reports this has been present for the last decade at least and reports it has not changed, has fallen off partially previously, has not grown, has not seen dermatology    discussed foot precautions.   Assessment/Plan: Please see individual problem list.  Essential hypertension Well-controlled. Continue current medications. Check labs.  DM type 2 (diabetes mellitus, type 2) Previously uncontrolled. She's cut down on juice intake. We'll check with our clinical pharmacist regarding possible medications or medication assistance for the patient.  HLD (hyperlipidemia) Check direct LDL. Continue Lipitor.  Nevus Patient notes this has been unchanged for some time. Suspect benign lesion given that it has been unchanged for many years. I did discuss having her see dermatology given the slight irregularity of the borders though she declined. I advised that she needed to let us know if it changed in any manner.   Orders Placed This Encounter  Procedures  . HgB A1c  . LDL cholesterol, direct  . Comp Met (CMET)   [name], MD [place]

## 2017-04-17 NOTE — Assessment & Plan Note (Signed)
Well-controlled Continue current medications Check labs 

## 2017-04-17 NOTE — Assessment & Plan Note (Signed)
Previously uncontrolled. She's cut down on juice intake. We'll check with our clinical pharmacist regarding possible medications or medication assistance for the patient.

## 2017-04-20 ENCOUNTER — Other Ambulatory Visit: Payer: Self-pay | Admitting: Family Medicine

## 2017-04-20 DIAGNOSIS — N183 Chronic kidney disease, stage 3 unspecified: Secondary | ICD-10-CM

## 2017-04-20 MED ORDER — EMPAGLIFLOZIN 10 MG PO TABS: 10.0000 mg | ORAL_TABLET | Freq: Every day | ORAL | 1 refills | Status: DC

## 2017-04-25 ENCOUNTER — Telehealth: Payer: Self-pay | Admitting: Family Medicine

## 2017-04-25 NOTE — Telephone Encounter (Signed)
Please advise 

## 2017-04-25 NOTE — Telephone Encounter (Signed)
[facility] empagliflozin (JARDIANCE) 10 MG TABS tablet not covered by pt insurance. Please advise?

## 2017-04-26 NOTE — Telephone Encounter (Signed)
Please advise 

## 2017-04-26 NOTE — Telephone Encounter (Signed)
Please find out if there are medications in that same class that her insurance would cover. Please then find out if the patient would be willing to try one of the other medications that is covered. Thanks.

## 2017-04-26 NOTE — Telephone Encounter (Signed)
Patient states she has [place] Medicare Part D - Preferred SGLT2 agents on this formulary include [place] and [place]. Patient states she is NOT interested in [place] 2/2 ADE. Willing to try [place]. Recommend Farxiga 5 mg daily to start. Prescription pended for Dr. Caryl Bis to sign if clinically appropriate.   Carlean Jews, Pharm.D. PGY2 Ambulatory Care Pharmacy Resident Phone: [phone]

## 2017-04-28 MED ORDER — DAPAGLIFLOZIN PROPANEDIOL 5 MG PO TABS: 5.0000 mg | ORAL_TABLET | Freq: Every day | ORAL | 2 refills | Status: DC

## 2017-04-28 NOTE — Telephone Encounter (Signed)
Agree. Sent to pharmacy. Please make sure patient keeps her appointment for lab work.

## 2017-04-30 ENCOUNTER — Other Ambulatory Visit (INDEPENDENT_AMBULATORY_CARE_PROVIDER_SITE_OTHER): Payer: Medicare Other

## 2017-04-30 DIAGNOSIS — N183 Chronic kidney disease, stage 3 unspecified: Secondary | ICD-10-CM

## 2017-04-30 LAB — BASIC METABOLIC PANEL
BUN: 21 mg/dL (ref 6–23)
CO2: 26 mEq/L (ref 19–32)
Calcium: 9.2 mg/dL (ref 8.4–10.5)
Chloride: 104 mEq/L (ref 96–112)
Creatinine, Ser: 1.19 mg/dL (ref 0.40–1.20)
GFR: 55.91 mL/min — ABNORMAL LOW (ref >60.00)
Glucose, Bld: 290 mg/dL — ABNORMAL HIGH (ref 70–99)
Potassium: 4.4 mEq/L (ref 3.5–5.1)
Sodium: 139 mEq/L (ref 135–145)

## 2017-04-30 NOTE — Telephone Encounter (Signed)
noted 

## 2017-05-03 DIAGNOSIS — H40013 Open angle with borderline findings, low risk, bilateral: Secondary | ICD-10-CM | POA: Diagnosis not present

## 2017-05-27 ENCOUNTER — Other Ambulatory Visit: Payer: Self-pay | Admitting: Family Medicine

## 2017-05-28 NOTE — Telephone Encounter (Signed)
Previously rx'd by Dr Lacinda Axon Last office visit 04/17/17 Next office visit 07/23/17

## 2017-06-29 ENCOUNTER — Other Ambulatory Visit: Payer: Self-pay

## 2017-06-29 MED ORDER — DAPAGLIFLOZIN PROPANEDIOL 5 MG PO TABS: 5.0000 mg | ORAL_TABLET | Freq: Every day | ORAL | 1 refills | Status: DC

## 2017-06-29 NOTE — Telephone Encounter (Signed)
Pharmacy is requesting 90 day refill

## 2017-07-09 ENCOUNTER — Encounter (INDEPENDENT_AMBULATORY_CARE_PROVIDER_SITE_OTHER): Payer: Medicare Other | Admitting: Ophthalmology

## 2017-07-09 DIAGNOSIS — H43813 Vitreous degeneration, bilateral: Secondary | ICD-10-CM | POA: Diagnosis not present

## 2017-07-09 DIAGNOSIS — E11319 Type 2 diabetes mellitus with unspecified diabetic retinopathy without macular edema: Secondary | ICD-10-CM

## 2017-07-09 DIAGNOSIS — E113293 Type 2 diabetes mellitus with mild nonproliferative diabetic retinopathy without macular edema, bilateral: Secondary | ICD-10-CM | POA: Diagnosis not present

## 2017-07-09 DIAGNOSIS — H35033 Hypertensive retinopathy, bilateral: Secondary | ICD-10-CM

## 2017-07-09 DIAGNOSIS — I10 Essential (primary) hypertension: Secondary | ICD-10-CM

## 2017-07-16 DIAGNOSIS — I129 Hypertensive chronic kidney disease with stage 1 through stage 4 chronic kidney disease, or unspecified chronic kidney disease: Secondary | ICD-10-CM | POA: Diagnosis not present

## 2017-07-16 DIAGNOSIS — E875 Hyperkalemia: Secondary | ICD-10-CM | POA: Diagnosis not present

## 2017-07-16 DIAGNOSIS — N183 Chronic kidney disease, stage 3 (moderate): Secondary | ICD-10-CM | POA: Diagnosis not present

## 2017-07-16 DIAGNOSIS — E1122 Type 2 diabetes mellitus with diabetic chronic kidney disease: Secondary | ICD-10-CM | POA: Diagnosis not present

## 2017-07-16 DIAGNOSIS — N2581 Secondary hyperparathyroidism of renal origin: Secondary | ICD-10-CM | POA: Diagnosis not present

## 2017-07-23 ENCOUNTER — Encounter: Payer: Self-pay | Admitting: Family Medicine

## 2017-07-23 ENCOUNTER — Ambulatory Visit (INDEPENDENT_AMBULATORY_CARE_PROVIDER_SITE_OTHER): Payer: Medicare Other | Admitting: Family Medicine

## 2017-07-23 ENCOUNTER — Other Ambulatory Visit: Payer: Self-pay

## 2017-07-23 VITALS — BP 150/62 | HR 79 | Temp 97.5°F | Wt 175.2 lb

## 2017-07-23 DIAGNOSIS — E785 Hyperlipidemia, unspecified: Secondary | ICD-10-CM | POA: Diagnosis not present

## 2017-07-23 DIAGNOSIS — E1122 Type 2 diabetes mellitus with diabetic chronic kidney disease: Secondary | ICD-10-CM

## 2017-07-23 DIAGNOSIS — I10 Essential (primary) hypertension: Secondary | ICD-10-CM

## 2017-07-23 DIAGNOSIS — D229 Melanocytic nevi, unspecified: Secondary | ICD-10-CM | POA: Diagnosis not present

## 2017-07-23 DIAGNOSIS — N183 Chronic kidney disease, stage 3 unspecified: Secondary | ICD-10-CM

## 2017-07-23 LAB — POCT GLYCOSYLATED HEMOGLOBIN (HGB A1C): Hemoglobin A1C: 10.2

## 2017-07-23 NOTE — Patient Instructions (Signed)
Nice to see you. We will contact you with your A1c result.

## 2017-07-23 NOTE — Assessment & Plan Note (Addendum)
We will check an A1c.  We will increase her metformin to 1000 mg twice daily.  She will transition to [place] this is on her formulary.  She will continue Tradjenta.  She is adamant that she will not inject herself with any medications.  We will try to get her set up for follow-up with our clinical pharmacist.

## 2017-07-23 NOTE — Progress Notes (Signed)
  [name], MD Phone: [phone]  Brianna Dodson is a 84 y.o. female who presents today for follow-up.  Hypertension: Not checking at home.  Taking lisinopril and amlodipine.  No chest pain, shortness of breath, or edema.  Diabetes: Last time she checked it her sugar was 160.  She is taking metformin, Jardiance, and Tradjenta.  She is finishing up the [place] and the Jardiance and then she is going to switch to [place] as this is covered by her insurance.  She notes no hypoglycemia.  No polyuria or polydipsia.  She saw ophthalmology on January 9 and has follow-up in July.  Hyperlipidemia: No right upper quadrant pain or myalgias.  Taking Lipitor.  She has been following with nephrology for chronic kidney disease.  Kidney function was slightly worse and then improved when we last checked.  Social History   Tobacco Use  Smoking Status Never Smoker  Smokeless Tobacco Never Used     ROS see history of present illness  Objective  Physical Exam Vitals:   07/23/17 0954  BP: (!) 150/62  Pulse: 79  Temp: (!) 97.5 F (36.4 C)  SpO2: 98%    BP Readings from Last 3 Encounters:  07/23/17 (!) 150/62  04/17/17 122/68  01/09/17 124/66   Wt Readings from Last 3 Encounters:  07/23/17 175 lb 3.2 oz (79.5 kg)  04/17/17 178 lb 6.4 oz (80.9 kg)  01/09/17 183 lb 8 oz (83.2 kg)    Physical Exam  Constitutional: No distress.  Cardiovascular: Normal rate, regular rhythm and normal heart sounds.  Pulmonary/Chest: Effort normal and breath sounds normal.  Musculoskeletal: She exhibits no edema.  Neurological: She is alert. Gait normal.  Skin: Skin is warm and dry. She is not diaphoretic.  Unchanged skin lesion on the dorsum of her left foot   Assessment/Plan: Please see individual problem list.  Essential hypertension Well-controlled for age.  She will continue her current regimen.  She is concerned about amlodipine being part of a drug recall.  I advised her to check with her  pharmacy to ensure that it is not part of a recall as this is not a recall I have heard of at this time.  CKD stage 3 due to type 2 diabetes mellitus She will continue to follow with nephrology.  DM type 2 (diabetes mellitus, type 2) We will check an A1c.  We will increase her metformin to 1000 mg twice daily.  She will transition to [place] this is on her formulary.  She will continue Tradjenta.  She is adamant that she will not inject herself with any medications.  We will try to get her set up for follow-up with our clinical pharmacist.  HLD (hyperlipidemia) Continue Lipitor.  Nevus Similar in appearance on the dorsum of her left foot.  She does not want any referrals regarding this.  She notes it is been unchanged for a long time.   Orders Placed This Encounter  Procedures  . POCT HgB A1C    No orders of the defined types were placed in this encounter.    [name], MD [place]

## 2017-07-23 NOTE — Assessment & Plan Note (Signed)
-  Continue Lipitor °

## 2017-07-23 NOTE — Assessment & Plan Note (Signed)
She will continue to follow with nephrology. 

## 2017-07-23 NOTE — Assessment & Plan Note (Signed)
Well-controlled for age.  She will continue her current regimen.  She is concerned about amlodipine being part of a drug recall.  I advised her to check with her pharmacy to ensure that it is not part of a recall as this is not a recall I have heard of at this time.

## 2017-07-24 NOTE — Assessment & Plan Note (Signed)
Similar in appearance on the dorsum of her left foot.  She does not want any referrals regarding this.  She notes it is been unchanged for a long time.

## 2017-07-28 ENCOUNTER — Telehealth: Payer: Self-pay | Admitting: Family Medicine

## 2017-07-28 NOTE — Telephone Encounter (Signed)
-----  Message from Carlean Jews, [facility] sent at 07/27/2017  3:47 PM EST ----- Agree, Wilder Glade should not be started or continued if eGFR is persistently between 30 and less than 60. Invokana and Jardiance have different cut points but I think her insurance only covers [place]. As far as other oral options: Metformin can be increased to 1000  BID, sulfonylureas should be used with caution (increased risk of hypoglycemia), actos does not require adjustment in renal dysfunction and I don't see any overt reasons to avoid in her but would double check no osteoporosis/penia, no CHF sx, no h/o bladder cancer.   Ultimately, I'd increase metformin and tell her to take with food to avoid GI upset.  [name] ----- Message ----- From: [name], MD Sent: 07/26/2017   4:46 PM To: Carlean Jews, [place], #  I will forward to [facility] regarding the farxiga and her renal function. I believe that this is not a good option for her with her renal function.   Janett Billow, can you advise her to hold off on the farxiga at this time until we contact her. Thanks.   [name], can you take a look at this patients chart and let me know what the best options for her would be orally for her diabetes. Looking at her renal function and farxiga I do not think it is a great option. Thanks, Randall Hiss.

## 2017-07-28 NOTE — Telephone Encounter (Signed)
Please contact the patient and advised her that she should be taking the metformin 1000 mg twice daily.  There is another oral medication that we could try called Actos.  Please make sure she is not having any shortness of breath, trouble breathing when she lays down at night, or waking up short of breath.  Please also make sure she does not have a history of bladder cancer.  Please check and see if she has a history of osteoporosis or osteopenia.  She likely needs a bone density scan as she has not had one in our system.  Depending on her answers to this we could try Actos.  Thanks.

## 2017-07-30 NOTE — Telephone Encounter (Signed)
Left message to call back, ok for pec to speak to patient regarding message from [place]  DR.Sonnenberg: Please contact the patient and advised her that she should be taking the metformin 1000 mg twice daily.  There is another oral medication that we could try called Actos.  Please make sure she is not having any shortness of breath, trouble breathing when she lays down at night, or waking up short of breath.  Please also make sure she does not have a history of bladder cancer.  Please check and see if she has a history of osteoporosis or osteopenia.  She likely needs a bone density scan as she has not had one in our system.  Depending on her answers to this we could try Actos.  Thanks.

## 2017-08-09 NOTE — Telephone Encounter (Signed)
I am happy to prescribe the actos for her, though she could try taking the metformin with food to limit GI upset or we could change her to the extended release version of metformin if she would prefer that. There is some risk of breaking a bone or developing heart failure with the actos and she would need to be aware of these risks and let us know if she were to develop any swelling, shortness of breath, or weight gain. Thanks.

## 2017-08-09 NOTE — Telephone Encounter (Signed)
Patient states she takes the metformin 3 times a d ay because 4 makes her stomach upset, she states she does not have any shortness of breath and no history of bladder cancer. She does not have a history of osteoporosis or osteopenia. She does not want a bone scan. She states she will try the Actos if that is what you recommend, she states she is taking the jardiance as well.

## 2017-08-10 NOTE — Telephone Encounter (Signed)
Tried calling no vm, ok for pec to speak to patient regarding Dr.Sonnenbergs message below   am happy to prescribe the actos for her, though she could try taking the metformin with food to limit GI upset or we could change her to the extended release version of metformin if she would prefer that. There is some risk of breaking a bone or developing heart failure with the actos and she would need to be aware of these risks and let us know if she were to develop any swelling, shortness of breath, or weight gain. Thanks.

## 2017-08-20 NOTE — Telephone Encounter (Signed)
Noted.  Did she want to try the extended release metformin?  Thanks.

## 2017-08-20 NOTE — Telephone Encounter (Signed)
Patient states she will try the metformin with food

## 2017-08-21 NOTE — Telephone Encounter (Signed)
No she states she will take the regular with food

## 2017-09-05 ENCOUNTER — Other Ambulatory Visit: Payer: Self-pay | Admitting: *Deleted

## 2017-09-05 MED ORDER — LISINOPRIL 40 MG PO TABS: ORAL_TABLET | ORAL | 1 refills | Status: DC

## 2017-09-05 MED ORDER — AMLODIPINE BESYLATE 10 MG PO TABS: ORAL_TABLET | ORAL | 1 refills | Status: DC

## 2017-09-05 MED ORDER — METFORMIN HCL 500 MG PO TABS: ORAL_TABLET | ORAL | 1 refills | Status: DC

## 2017-10-23 ENCOUNTER — Other Ambulatory Visit: Payer: Medicare Other

## 2017-10-23 ENCOUNTER — Other Ambulatory Visit: Payer: Self-pay

## 2017-10-23 ENCOUNTER — Encounter: Payer: Self-pay | Admitting: Family Medicine

## 2017-10-23 ENCOUNTER — Ambulatory Visit (INDEPENDENT_AMBULATORY_CARE_PROVIDER_SITE_OTHER): Payer: Medicare Other | Admitting: Family Medicine

## 2017-10-23 VITALS — BP 140/74 | HR 73 | Ht 65.0 in | Wt 176.8 lb

## 2017-10-23 DIAGNOSIS — E1122 Type 2 diabetes mellitus with diabetic chronic kidney disease: Secondary | ICD-10-CM

## 2017-10-23 DIAGNOSIS — I10 Essential (primary) hypertension: Secondary | ICD-10-CM

## 2017-10-23 DIAGNOSIS — L299 Pruritus, unspecified: Secondary | ICD-10-CM

## 2017-10-23 DIAGNOSIS — N183 Chronic kidney disease, stage 3 unspecified: Secondary | ICD-10-CM

## 2017-10-23 DIAGNOSIS — E785 Hyperlipidemia, unspecified: Secondary | ICD-10-CM

## 2017-10-23 LAB — COMPREHENSIVE METABOLIC PANEL
ALT: 16 U/L (ref 0–35)
AST: 20 U/L (ref 0–37)
Albumin: 3.8 g/dL (ref 3.5–5.2)
Alkaline Phosphatase: 47 U/L (ref 39–117)
BUN: 22 mg/dL (ref 6–23)
CO2: 27 mEq/L (ref 19–32)
Calcium: 9.4 mg/dL (ref 8.4–10.5)
Chloride: 104 mEq/L (ref 96–112)
Creatinine, Ser: 1.32 mg/dL — ABNORMAL HIGH (ref 0.40–1.20)
GFR: 49.55 mL/min — ABNORMAL LOW (ref >60.00)
Glucose, Bld: 221 mg/dL — ABNORMAL HIGH (ref 70–99)
Potassium: 4.6 mEq/L (ref 3.5–5.1)
Sodium: 137 mEq/L (ref 135–145)
Total Bilirubin: 0.8 mg/dL (ref 0.2–1.2)
Total Protein: 7.3 g/dL (ref 6.0–8.3)

## 2017-10-23 LAB — LIPID PANEL
Cholesterol: 235 mg/dL — ABNORMAL HIGH (ref 0–200)
HDL: 44.3 mg/dL (ref >39.00)
LDL Cholesterol: 157 mg/dL — ABNORMAL HIGH (ref 0–99)
NonHDL: 190.62
Total CHOL/HDL Ratio: 5
Triglycerides: 168 mg/dL — ABNORMAL HIGH (ref 0.0–149.0)
VLDL: 33.6 mg/dL (ref 0.0–40.0)

## 2017-10-23 MED ORDER — GLUCOSE BLOOD VI STRP: 1.0000 | ORAL_STRIP | Freq: Two times a day (BID) | 5 refills | Status: DC

## 2017-10-23 MED ORDER — HYDROCORTISONE VALERATE 0.2 % EX CREA
1.0000 | TOPICAL_CREAM | Freq: Two times a day (BID) | CUTANEOUS | 0 refills | Status: DC
Start: 2017-10-23 — End: 2018-01-24

## 2017-10-23 NOTE — Assessment & Plan Note (Signed)
Patient reports itching of external genitals.  She declines exam today.  I discussed that it would be difficult for me to treat this appropriately without an exam.  She still declined the exam and she advised me to try treating empirically.  Given that she has no discharge we will treat with topical low-dose steroids. If not improving she will return for an exam.

## 2017-10-23 NOTE — Assessment & Plan Note (Signed)
Discussed change to Trulicity.  She would consider this.  We will check an A1c and contact her to discuss further.

## 2017-10-23 NOTE — Patient Instructions (Signed)
Nice to see you. We will check lab work today and contact you with the results. Please try the hydrocortisone ointment for the itching.  If that is not beneficial you need to return so we can do an exam.

## 2017-10-23 NOTE — Assessment & Plan Note (Signed)
Check lipid panel.  Continue Lipitor. 

## 2017-10-23 NOTE — Progress Notes (Signed)
  [name], MD Phone: [phone]  Brianna Dodson is a 84 y.o. female who presents today for f/u.  HYPERTENSION Disease Monitoring: Blood pressure range-not checking Chest pain- no      Dyspnea- no Medications: Compliance- taking lisinopril, amlodipine   Edema- no  DIABETES Disease Monitoring: Blood Sugar ranges-not checking Polyuria/phagia/dipsia- no      Optho- UTD Medications: Compliance- taking metformin, tradjenta, jardiance, notes she is about to run out of coverage for tradjenta and jardiance Hypoglycemic symptoms- no  HYPERLIPIDEMIA Disease Monitoring: See symptoms for Hypertension Medications: Compliance- taking lipitor Right upper quadrant pain- no  Muscle aches- no  Recently notes for the last 3 days she has had a little bit of itching on her external vaginal area.  No internal itching or discharge.  Has been about the same over the last several days.  She scratches and it feels better.  She declines exam today.  Social History   Tobacco Use  Smoking Status Never Smoker  Smokeless Tobacco Never Used     ROS see history of present illness  Objective  Physical Exam Vitals:   10/23/17 0930  BP: 140/74  Pulse: 73  SpO2: 97%    BP Readings from Last 3 Encounters:  10/23/17 140/74  07/23/17 (!) 150/62  04/17/17 122/68   Wt Readings from Last 3 Encounters:  10/23/17 176 lb 12.8 oz (80.2 kg)  07/23/17 175 lb 3.2 oz (79.5 kg)  04/17/17 178 lb 6.4 oz (80.9 kg)    Physical Exam  Constitutional: No distress.  Cardiovascular: Normal rate, regular rhythm and normal heart sounds.  Pulmonary/Chest: Effort normal and breath sounds normal.  Musculoskeletal: She exhibits no edema.  Neurological: She is alert.  Skin: Skin is warm and dry. She is not diaphoretic.     Assessment/Plan: Please see individual problem list.  Essential hypertension Well-controlled.  Continue current regimen.  DM type 2 (diabetes mellitus, type 2) Discussed change to  Trulicity.  She would consider this.  We will check an A1c and contact her to discuss further.  HLD (hyperlipidemia) Check lipid panel.  Continue Lipitor.  Itching Patient reports itching of external genitals.  She declines exam today.  I discussed that it would be difficult for me to treat this appropriately without an exam.  She still declined the exam and she advised me to try treating empirically.  Given that she has no discharge we will treat with topical low-dose steroids. If not improving she will return for an exam.    Orders Placed This Encounter  Procedures  . Lipid panel  . Comp Met (CMET)  . HgB A1c    Meds ordered this encounter  Medications  . glucose blood (ONETOUCH VERIO) test strip    Sig: 1 each by Other route 2 (two) times daily.    Dispense:  200 each    Refill:  5  . hydrocortisone valerate cream (WESTCORT) 0.2 %    Sig: Apply 1 application topically 2 (two) times daily. For up to 5 days    Dispense:  15 g    Refill:  0     [name], MD [place]

## 2017-10-23 NOTE — Assessment & Plan Note (Signed)
Well-controlled.  Continue current regimen. 

## 2017-10-24 LAB — HEMOGLOBIN A1C
Hgb A1c MFr Bld: 10.4 % of total Hgb — ABNORMAL HIGH (ref <5.7)
Mean Plasma Glucose: 252 (calc)
eAG (mmol/L): 13.9 (calc)

## 2017-10-28 ENCOUNTER — Other Ambulatory Visit: Payer: Self-pay | Admitting: Family Medicine

## 2017-10-28 MED ORDER — DULAGLUTIDE 0.75 MG/0.5ML ~~LOC~~ SOAJ: 0.7500 mg | SUBCUTANEOUS | 1 refills | Status: DC

## 2017-10-28 MED ORDER — ROSUVASTATIN CALCIUM 40 MG PO TABS: 40.0000 mg | ORAL_TABLET | Freq: Every day | ORAL | 3 refills | Status: DC

## 2017-11-08 ENCOUNTER — Ambulatory Visit: Payer: Commercial Indemnity

## 2018-01-02 DIAGNOSIS — H35033 Hypertensive retinopathy, bilateral: Secondary | ICD-10-CM | POA: Diagnosis not present

## 2018-01-02 DIAGNOSIS — I708 Atherosclerosis of other arteries: Secondary | ICD-10-CM | POA: Diagnosis not present

## 2018-01-02 DIAGNOSIS — H40013 Open angle with borderline findings, low risk, bilateral: Secondary | ICD-10-CM | POA: Diagnosis not present

## 2018-01-02 DIAGNOSIS — E113393 Type 2 diabetes mellitus with moderate nonproliferative diabetic retinopathy without macular edema, bilateral: Secondary | ICD-10-CM | POA: Diagnosis not present

## 2018-01-07 ENCOUNTER — Encounter (INDEPENDENT_AMBULATORY_CARE_PROVIDER_SITE_OTHER): Payer: Medicare Other | Admitting: Ophthalmology

## 2018-01-07 DIAGNOSIS — E113312 Type 2 diabetes mellitus with moderate nonproliferative diabetic retinopathy with macular edema, left eye: Secondary | ICD-10-CM | POA: Diagnosis not present

## 2018-01-07 DIAGNOSIS — H35033 Hypertensive retinopathy, bilateral: Secondary | ICD-10-CM

## 2018-01-07 DIAGNOSIS — I10 Essential (primary) hypertension: Secondary | ICD-10-CM | POA: Diagnosis not present

## 2018-01-07 DIAGNOSIS — E11311 Type 2 diabetes mellitus with unspecified diabetic retinopathy with macular edema: Secondary | ICD-10-CM | POA: Diagnosis not present

## 2018-01-07 DIAGNOSIS — H43813 Vitreous degeneration, bilateral: Secondary | ICD-10-CM | POA: Diagnosis not present

## 2018-01-07 DIAGNOSIS — E113391 Type 2 diabetes mellitus with moderate nonproliferative diabetic retinopathy without macular edema, right eye: Secondary | ICD-10-CM

## 2018-01-09 DIAGNOSIS — R809 Proteinuria, unspecified: Secondary | ICD-10-CM | POA: Diagnosis not present

## 2018-01-09 DIAGNOSIS — N183 Chronic kidney disease, stage 3 (moderate): Secondary | ICD-10-CM | POA: Diagnosis not present

## 2018-01-09 DIAGNOSIS — N2581 Secondary hyperparathyroidism of renal origin: Secondary | ICD-10-CM | POA: Diagnosis not present

## 2018-01-09 DIAGNOSIS — I129 Hypertensive chronic kidney disease with stage 1 through stage 4 chronic kidney disease, or unspecified chronic kidney disease: Secondary | ICD-10-CM | POA: Diagnosis not present

## 2018-01-09 DIAGNOSIS — E1122 Type 2 diabetes mellitus with diabetic chronic kidney disease: Secondary | ICD-10-CM | POA: Diagnosis not present

## 2018-01-24 ENCOUNTER — Other Ambulatory Visit: Payer: Self-pay | Admitting: Family Medicine

## 2018-01-24 ENCOUNTER — Encounter: Payer: Self-pay | Admitting: Family Medicine

## 2018-01-24 ENCOUNTER — Ambulatory Visit (INDEPENDENT_AMBULATORY_CARE_PROVIDER_SITE_OTHER): Payer: Medicare Other | Admitting: Family Medicine

## 2018-01-24 VITALS — BP 140/80 | HR 76 | Temp 97.4°F | Ht 65.0 in | Wt 179.6 lb

## 2018-01-24 DIAGNOSIS — N183 Chronic kidney disease, stage 3 unspecified: Secondary | ICD-10-CM

## 2018-01-24 DIAGNOSIS — I10 Essential (primary) hypertension: Secondary | ICD-10-CM | POA: Diagnosis not present

## 2018-01-24 DIAGNOSIS — E1122 Type 2 diabetes mellitus with diabetic chronic kidney disease: Secondary | ICD-10-CM | POA: Diagnosis not present

## 2018-01-24 DIAGNOSIS — E785 Hyperlipidemia, unspecified: Secondary | ICD-10-CM

## 2018-01-24 DIAGNOSIS — D229 Melanocytic nevi, unspecified: Secondary | ICD-10-CM | POA: Diagnosis not present

## 2018-01-24 LAB — HEPATIC FUNCTION PANEL
ALT: 14 U/L (ref 0–35)
AST: 15 U/L (ref 0–37)
Albumin: 3.8 g/dL (ref 3.5–5.2)
Alkaline Phosphatase: 51 U/L (ref 39–117)
Bilirubin, Direct: 0.1 mg/dL (ref 0.0–0.3)
Total Bilirubin: 0.6 mg/dL (ref 0.2–1.2)
Total Protein: 7.5 g/dL (ref 6.0–8.3)

## 2018-01-24 LAB — LDL CHOLESTEROL, DIRECT: Direct LDL: 98 mg/dL

## 2018-01-24 LAB — HEMOGLOBIN A1C: Hgb A1c MFr Bld: 9.6 % — ABNORMAL HIGH (ref 4.6–6.5)

## 2018-01-24 MED ORDER — BLOOD GLUCOSE MONITOR KIT: PACK | 0 refills | Status: DC

## 2018-01-24 MED ORDER — GLUCOSE BLOOD VI STRP: 1.0000 | ORAL_STRIP | Freq: Two times a day (BID) | 5 refills | Status: DC

## 2018-01-24 NOTE — Telephone Encounter (Signed)
The one that was given in the office is not covered by insurance, she will need new meter and supplies sent to pharmacy for accu check

## 2018-01-24 NOTE — Assessment & Plan Note (Signed)
Continue crestor. Check labs.

## 2018-01-24 NOTE — Assessment & Plan Note (Signed)
Possible SK. Stable. Monitor.

## 2018-01-24 NOTE — Telephone Encounter (Signed)
Printed.  Please place on my desk to sign and fax.

## 2018-01-24 NOTE — Progress Notes (Signed)
  [name], MD Phone: [phone]  Brianna Dodson is a 84 y.o. female who presents today for f/u.  CC: htn, hld, dm  HYPERTENSION Disease Monitoring: Blood pressure range-not checking Chest pain- no      Dyspnea- no Medications: Compliance- taking amlodipine   Edema- no  DIABETES Disease Monitoring: Blood Sugar ranges-not checking Polyuria/phagia/dipsia- no      Optho- UTD Medications: Compliance- taking metformin, tradjenta Hypoglycemic symptoms- no  HYPERLIPIDEMIA Disease Monitoring: See symptoms for Hypertension Medications: Compliance- taking crestor Right upper quadrant pain- no  Muscle aches- no  Skin lesion on foot is stable. Has fallen off previously and come back. Has been stable for years.   Social History   Tobacco Use  Smoking Status Never Smoker  Smokeless Tobacco Never Used     ROS see history of present illness  Objective  Physical Exam Vitals:   01/24/18 0858  BP: 140/80  Pulse: 76  Temp: (!) 97.4 F (36.3 C)  SpO2: 97%    BP Readings from Last 3 Encounters:  01/24/18 140/80  10/23/17 140/74  07/23/17 (!) 150/62   Wt Readings from Last 3 Encounters:  01/24/18 179 lb 9.6 oz (81.5 kg)  10/23/17 176 lb 12.8 oz (80.2 kg)  07/23/17 175 lb 3.2 oz (79.5 kg)    Physical Exam  Constitutional: No distress.  Cardiovascular: Normal rate, regular rhythm and normal heart sounds.  Pulmonary/Chest: Effort normal and breath sounds normal.  Musculoskeletal: She exhibits no edema.  Neurological: She is alert.  Skin: Skin is warm and dry. She is not diaphoretic.     Assessment/Plan: Please see individual problem list.  Essential hypertension At goal. Continue amlodipine.  DM type 2 (diabetes mellitus, type 2) Check A1c. Given glucometer. Continue current regimen.   HLD (hyperlipidemia) Continue crestor. Check labs.   Nevus Possible SK. Stable. Monitor.    Orders Placed This Encounter  Procedures  . Hepatic function panel  .  Direct LDL  . HgB A1c    Meds ordered this encounter  Medications  . glucose blood (ONETOUCH VERIO) test strip    Sig: 1 each by Other route 2 (two) times daily.    Dispense:  200 each    Refill:  [place], MD [place]

## 2018-01-24 NOTE — Assessment & Plan Note (Signed)
Check A1c. Given glucometer. Continue current regimen.

## 2018-01-24 NOTE — Patient Instructions (Signed)
Nice to see you. We will contact you with your labs. Please start checking your blood sugar 1-2 times a day.

## 2018-01-24 NOTE — Assessment & Plan Note (Signed)
At goal. Continue amlodipine.  

## 2018-01-25 MED ORDER — BLOOD GLUCOSE MONITOR KIT: PACK | 0 refills | Status: DC

## 2018-01-25 NOTE — Telephone Encounter (Signed)
faxed

## 2018-02-24 ENCOUNTER — Other Ambulatory Visit: Payer: Self-pay | Admitting: Family Medicine

## 2018-02-28 ENCOUNTER — Other Ambulatory Visit: Payer: Self-pay | Admitting: Family Medicine

## 2018-03-01 ENCOUNTER — Other Ambulatory Visit: Payer: Self-pay | Admitting: Family Medicine

## 2018-03-07 ENCOUNTER — Other Ambulatory Visit: Payer: Self-pay | Admitting: Family Medicine

## 2018-03-07 DIAGNOSIS — E1122 Type 2 diabetes mellitus with diabetic chronic kidney disease: Secondary | ICD-10-CM

## 2018-03-07 DIAGNOSIS — N183 Chronic kidney disease, stage 3 (moderate): Principal | ICD-10-CM

## 2018-03-11 ENCOUNTER — Other Ambulatory Visit (INDEPENDENT_AMBULATORY_CARE_PROVIDER_SITE_OTHER): Payer: Medicare Other

## 2018-03-11 DIAGNOSIS — N183 Chronic kidney disease, stage 3 (moderate): Secondary | ICD-10-CM

## 2018-03-11 DIAGNOSIS — E1122 Type 2 diabetes mellitus with diabetic chronic kidney disease: Secondary | ICD-10-CM

## 2018-03-11 LAB — BASIC METABOLIC PANEL
BUN: 26 mg/dL — ABNORMAL HIGH (ref 6–23)
CO2: 25 mEq/L (ref 19–32)
Calcium: 8.8 mg/dL (ref 8.4–10.5)
Chloride: 104 mEq/L (ref 96–112)
Creatinine, Ser: 1.56 mg/dL — ABNORMAL HIGH (ref 0.40–1.20)
GFR: 40.82 mL/min — ABNORMAL LOW (ref >60.00)
Glucose, Bld: 320 mg/dL — ABNORMAL HIGH (ref 70–99)
Potassium: 4.5 mEq/L (ref 3.5–5.1)
Sodium: 136 mEq/L (ref 135–145)

## 2018-03-14 ENCOUNTER — Telehealth: Payer: Self-pay

## 2018-03-14 NOTE — Telephone Encounter (Signed)
Patient called and requested that I fowarded her labs to Dr Juleen China her Nephologist.

## 2018-03-14 NOTE — Telephone Encounter (Signed)
Copied from [place] [phone]. Topic: General - Other >> Mar 13, 2018  2:26 PM Keene Breath wrote: Reason for CRM: Patient called to say she is returning a call from the PCP nurse practitioner.  She was not sure of why she was getting the call.  Tried to call office but no one was available.  Please would like a call back from NP.  MD#[phone]

## 2018-03-15 ENCOUNTER — Encounter: Payer: Self-pay | Admitting: Family

## 2018-03-15 ENCOUNTER — Ambulatory Visit (INDEPENDENT_AMBULATORY_CARE_PROVIDER_SITE_OTHER): Payer: Medicare Other | Admitting: Family

## 2018-03-15 DIAGNOSIS — I10 Essential (primary) hypertension: Secondary | ICD-10-CM

## 2018-03-15 DIAGNOSIS — E1122 Type 2 diabetes mellitus with diabetic chronic kidney disease: Secondary | ICD-10-CM

## 2018-03-15 DIAGNOSIS — N183 Chronic kidney disease, stage 3 unspecified: Secondary | ICD-10-CM

## 2018-03-15 NOTE — Telephone Encounter (Addendum)
Called patient back and advised her that her sugar was  too high on labwork done on 03/11/18 .   I advised patient that with sugars being this high would effect her potassium and electrolytes and possible put her diabetic keto acidosis .  She stated that sugars had been running this way for 20 years or so as well as kidney functions .  Patient reported that sugar fasting yesterday was 210. She stated that she dosen't want to go on insulin. Offered her appointment today with Nurse Practioner today to discuss options to manage her sugars.She will think about coming in for appointment today and call me back   She is willing to come in for appointment next week to see Dr Caryl Bis to discuss blood sugars. She will think about coming in for appointment today and call me back.

## 2018-03-15 NOTE — Assessment & Plan Note (Signed)
Controlled. Will continue current regimen 

## 2018-03-15 NOTE — Assessment & Plan Note (Addendum)
Poorly controlled. Clinically asymptomatic. Education provided on risk of hyperglycemia and effect on renal function, likely evidenced by recent increase in Crt. Advised she may stay on metformin since she has been on medication and per clinical pharmacist, we hold metformin if GFR < 30.  Declines any injectable; I strongly advised lantus or Trulicity. She declines Advised to follow up with PCP.

## 2018-03-15 NOTE — Patient Instructions (Signed)
As discussed, I would highly advise you to consider an injectable therapy such as insulin or Trulicity as your sugars are quite high  Please let me know if you change your mind  Please also keep your follow up with Dr Caryl Bis

## 2018-03-15 NOTE — Progress Notes (Signed)
Subjective:    [name], female    DOB: [birth year], 84 y.o.   MRN: [id number]  CC: Brianna Dodson is a 84 y.o. female who presents today for follow up.   HPI: DM-  Elevated blood sugars. Has been on metformin , 590m BID. Had tried to take 10038mBID however felt lightheaded. On trajenta.   Didn't start farxiga due to AE concerns. Doesn't want an injectable such as trulicity so never picked up prescription from 10/2017. No polydipsia, polyphagia, vision changes. No numbness in feet.   Fasting blood sugar today was 250 this morning.  Doesn't check blood sugar other than in the morning.   HTN- compliant with medication, hasnt taken medications today.  Denies exertional chest pain or pressure, numbness or tingling radiating to left arm or jaw, palpitations, dizziness, frequent headaches, changes in vision, or shortness of breath.      HISTORY:  Past Medical History:  Diagnosis Date  . CKD (chronic kidney disease), stage III ([facility]  . Diabetes mellitus without complication ([facility]  . History of blood transfusion   . Hyperlipidemia   . Hypertension    Past Surgical History:  Procedure Laterality Date  . ABDOMINAL HYSTERECTOMY  1985   Family History  Problem Relation Age of Onset  . Stroke Mother   . Diabetes Mother   . Heart disease Father   . Kidney disease Sister   . Diabetes Sister   . Kidney disease Brother   . Heart disease Sister   . Diabetes Sister   . Diabetes Sister   . Diabetes Sister   . Kidney disease Sister   . Heart disease Sister   . Kidney disease Brother        kidney transplant  . Early death Brother [id number]     Truck Accident - died  . Heart disease Brother     Allergies: Nsaids Current Outpatient Medications on File Prior to Visit  Medication Sig Dispense Refill  . ACCU-CHEK GUIDE test strip USE TO CHECK BLOOD SUGARS TWICE DAILY. E11.9 100 each 0  . amLODipine (NORVASC) 10 MG tablet TAKE 1 TABLET BY MOUTH EVERY DAY 90 tablet 1  .  aspirin 81 MG tablet Take 81 mg by mouth daily.    . blood glucose meter kit and supplies KIT Dispense based on patient and insurance preference. Check CBGs two times daily. E11.9. 1 each 0  . blood glucose meter kit and supplies Dispense based on patient and insurance preference. Use up to four times daily as directed. E11.9 1 each 0  . Lancets (ONETOUCH ULTRASOFT) lancets Use as instructed 100 each 6  . lisinopril (PRINIVIL,ZESTRIL) 40 MG tablet TAKE 1 TABLET BY MOUTH EVERY DAY 90 tablet 1  . metFORMIN (GLUCOPHAGE) 500 MG tablet TAKE 1 TABLET (500 MG TOTAL) BY MOUTH 2 (TWO) TIMES DAILY WITH A MEAL. 180 tablet 1  . rosuvastatin (CRESTOR) 40 MG tablet Take 1 tablet (40 mg total) by mouth daily. 90 tablet 3  . TRADJENTA 5 MG TABS tablet TAKE 1 TABLET BY MOUTH DAILY. 90 tablet 1  . Vitamin D, Ergocalciferol, (DRISDOL) 50000 units CAPS capsule Take 50,000 Units by mouth every 30 (thirty) days.  1   No current facility-administered medications on file prior to visit.     Social History   Tobacco Use  . Smoking status: Never Smoker  . Smokeless tobacco: Never Used  Substance Use Topics  . Alcohol use: No  . Drug use: No  Review of Systems  Constitutional: Negative for chills and fever.  Respiratory: Negative for cough.   Cardiovascular: Negative for chest pain and palpitations.  Gastrointestinal: Negative for nausea and vomiting.  Endocrine: Negative for polydipsia, polyphagia and polyuria.  Genitourinary: Negative for frequency.  Neurological: Negative for numbness.      Objective:    BP 130/70   Pulse 80   Temp 98.1 F (36.7 C) (Oral)   Resp 15   Ht _0  (1.651 m)   Wt 179 lb 6 oz (81.4 kg)   SpO2 96%   BMI 29.85 kg/m  BP Readings from Last 3 Encounters:  03/15/18 130/70  01/24/18 140/80  10/23/17 140/74   Wt Readings from Last 3 Encounters:  03/15/18 179 lb 6 oz (81.4 kg)  01/24/18 179 lb 9.6 oz (81.5 kg)  10/23/17 176 lb 12.8 oz (80.2 kg)    Physical Exam    Constitutional: She appears well-developed and well-nourished.  Eyes: Conjunctivae are normal.  Cardiovascular: Normal rate, regular rhythm, normal heart sounds and normal pulses.  Pulmonary/Chest: Effort normal and breath sounds normal. She has no wheezes. She has no rhonchi. She has no rales.  Neurological: She is alert.  Skin: Skin is warm and dry.  Psychiatric: She has a normal mood and affect. Her speech is normal and behavior is normal. Thought content normal.  Vitals reviewed.      Assessment & Plan:   Problem List Items Addressed This Visit      Cardiovascular and Mediastinum   Essential hypertension    Controlled. Will continue current regimen        Endocrine   DM type 2 (diabetes mellitus, type 2) ([place])    Poorly controlled. Clinically asymptomatic. Education provided on risk of hyperglycemia and effect on renal function, likely evidenced by recent increase in Crt. Advised she may stay on metformin since she has been on medication and per clinical pharmacist, we hold metformin if GFR < 30.  Declines any injectable; I strongly advised lantus or Trulicity. She declines Advised to follow up with PCP.             I am having Keyari Lobello maintain her aspirin, TRADJENTA, blood glucose meter kit and supplies, onetouch ultrasoft, Vitamin D (Ergocalciferol), rosuvastatin, blood glucose meter kit and supplies, ACCU-CHEK GUIDE, metFORMIN, lisinopril, and amLODipine.   No orders of the defined types were placed in this encounter.   Return precautions given.   Risks, benefits, and alternatives of the medications and treatment plan prescribed today were discussed, and patient expressed understanding.   Education regarding symptom management and diagnosis given to patient on AVS.  Continue to follow with [name], MD for routine health maintenance.   Brianna Dodson and I agreed with plan.   Mable Paris, FNP

## 2018-03-15 NOTE — Telephone Encounter (Signed)
Thank you for sending labs  Her appt with sonnenberg is not until Nov - this is way too  Long with her sugars so high  Please advise again that she sees him ( sooner if possible) OR she's another provider here  Please try to emphasize the importance of this and our concern with her elevated blood sugars

## 2018-03-15 NOTE — Telephone Encounter (Signed)
noted 

## 2018-03-29 ENCOUNTER — Emergency Department: Payer: Medicare Other

## 2018-03-29 ENCOUNTER — Encounter: Payer: Self-pay | Admitting: Emergency Medicine

## 2018-03-29 ENCOUNTER — Emergency Department
Admission: EM | Admit: 2018-03-29 | Discharge: 2018-03-29 | Disposition: A | Discharge status: Home / Self Care | Payer: Medicare Other | Attending: Emergency Medicine | Admitting: Emergency Medicine

## 2018-03-29 DIAGNOSIS — S4992XA Unspecified injury of left shoulder and upper arm, initial encounter: Secondary | ICD-10-CM | POA: Diagnosis present

## 2018-03-29 DIAGNOSIS — Z79899 Other long term (current) drug therapy: Secondary | ICD-10-CM | POA: Diagnosis not present

## 2018-03-29 DIAGNOSIS — Z7982 Long term (current) use of aspirin: Secondary | ICD-10-CM | POA: Diagnosis not present

## 2018-03-29 DIAGNOSIS — N183 Chronic kidney disease, stage 3 (moderate): Secondary | ICD-10-CM | POA: Diagnosis not present

## 2018-03-29 DIAGNOSIS — Y999 Unspecified external cause status: Secondary | ICD-10-CM | POA: Insufficient documentation

## 2018-03-29 DIAGNOSIS — M25512 Pain in left shoulder: Secondary | ICD-10-CM | POA: Diagnosis not present

## 2018-03-29 DIAGNOSIS — Y92018 Other place in single-family (private) house as the place of occurrence of the external cause: Secondary | ICD-10-CM | POA: Diagnosis not present

## 2018-03-29 DIAGNOSIS — S40012A Contusion of left shoulder, initial encounter: Secondary | ICD-10-CM

## 2018-03-29 DIAGNOSIS — W01198A Fall on same level from slipping, tripping and stumbling with subsequent striking against other object, initial encounter: Secondary | ICD-10-CM | POA: Insufficient documentation

## 2018-03-29 DIAGNOSIS — Z7984 Long term (current) use of oral hypoglycemic drugs: Secondary | ICD-10-CM | POA: Diagnosis not present

## 2018-03-29 DIAGNOSIS — Y9389 Activity, other specified: Secondary | ICD-10-CM | POA: Insufficient documentation

## 2018-03-29 DIAGNOSIS — E1122 Type 2 diabetes mellitus with diabetic chronic kidney disease: Secondary | ICD-10-CM | POA: Diagnosis not present

## 2018-03-29 DIAGNOSIS — I129 Hypertensive chronic kidney disease with stage 1 through stage 4 chronic kidney disease, or unspecified chronic kidney disease: Secondary | ICD-10-CM | POA: Insufficient documentation

## 2018-03-29 NOTE — Discharge Instructions (Addendum)
Follow-up with your primary care provider if any continued problems.  You may use ice or heat to your shoulder as needed for comfort.

## 2018-03-29 NOTE — ED Provider Notes (Signed)
[facility] Emergency Department Provider Note   ____________________________________________   First MD Initiated Contact with Patient 03/29/18 [phone]     (approximate)  I have reviewed the triage vital signs and the nursing notes.   HISTORY  Chief Complaint Fall and Arm Injury   HPI Brianna Dodson is a 84 y.o. female presents to the ED with complaint of injury to her left shoulder 5 days ago.  Patient states that she tripped over her shoes causing her to fall.  She denies any head injury or loss of consciousness.  Patient states that she is continue to use her left arm but has some difficulty getting fully extended.  Patient states that it is better today than it was when she initially fell.  Her pain is 5 out of 10.  Past Medical History:  Diagnosis Date  . CKD (chronic kidney disease), stage III ([place])   . Diabetes mellitus without complication ([place])   . History of blood transfusion   . Hyperlipidemia   . Hypertension     Patient Active Problem List   Diagnosis Date Noted  . Itching 10/23/2017  . Nevus 04/17/2017  . CKD stage 3 due to type 2 diabetes mellitus ([place]) 02/02/2015  . Vitamin D deficiency 02/02/2015  . Obesity (BMI 30-39.9) 08/03/2014  . DM type 2 (diabetes mellitus, type 2) ([place]) 01/29/2014  . Essential hypertension 01/13/2014  . HLD (hyperlipidemia) 01/13/2014    Past Surgical History:  Procedure Laterality Date  . ABDOMINAL HYSTERECTOMY  1985    Prior to Admission medications   Medication Sig Start Date End Date Taking? Authorizing Provider  ACCU-CHEK GUIDE test strip USE TO CHECK BLOOD SUGARS TWICE DAILY. E11.9 03/01/18   [name], MD  amLODipine (NORVASC) 10 MG tablet TAKE 1 TABLET BY MOUTH EVERY DAY 03/01/18   [name], MD  aspirin 81 MG tablet Take 81 mg by mouth daily.    [provider]  blood glucose meter kit and supplies KIT Dispense based on patient and insurance preference. Check CBGs two  times daily. E11.9. 01/25/18   [name], MD  blood glucose meter kit and supplies Dispense based on patient and insurance preference. Use up to four times daily as directed. E11.9 09/14/16   Coral Spikes, DO  Lancets [facility] ULTRASOFT) lancets Use as instructed 09/14/16   Coral Spikes, DO  lisinopril (PRINIVIL,ZESTRIL) 40 MG tablet TAKE 1 TABLET BY MOUTH EVERY DAY 03/01/18   [name], MD  metFORMIN (GLUCOPHAGE) 500 MG tablet TAKE 1 TABLET (500 MG TOTAL) BY MOUTH 2 (TWO) TIMES DAILY WITH A MEAL. 03/01/18   [name], MD  rosuvastatin (CRESTOR) 40 MG tablet Take 1 tablet (40 mg total) by mouth daily. 10/28/17   [name], MD  TRADJENTA 5 MG TABS tablet TAKE 1 TABLET BY MOUTH DAILY. 08/04/16   Coral Spikes, DO  Vitamin D, Ergocalciferol, (DRISDOL) 50000 units CAPS capsule Take 50,000 Units by mouth every 30 (thirty) days. 10/05/17   [provider]    Allergies Nsaids  Family History  Problem Relation Age of Onset  . Stroke Mother   . Diabetes Mother   . Heart disease Father   . Kidney disease Sister   . Diabetes Sister   . Kidney disease Brother   . Heart disease Sister   . Diabetes Sister   . Diabetes Sister   . Diabetes Sister   . Kidney disease Sister   . Heart  disease Sister   . Kidney disease Brother        kidney transplant  . Early death Brother 53       Truck Accident - died  . Heart disease Brother     Social History Social History   Tobacco Use  . Smoking status: Never Smoker  . Smokeless tobacco: Never Used  Substance Use Topics  . Alcohol use: No  . Drug use: No    Review of Systems Constitutional: No fever/chills Cardiovascular: Denies chest pain. Respiratory: Denies shortness of breath. Musculoskeletal: Positive for left shoulder pain. Skin: Negative for rash. Neurological: Negative for headaches, focal weakness or numbness. ____________________________________________   PHYSICAL EXAM:  VITAL SIGNS: ED  Triage Vitals  Enc Vitals Group     BP 03/29/18 0906 (!) 153/63     Pulse Rate 03/29/18 0904 80     Resp 03/29/18 0904 20     Temp 03/29/18 0904 98.2 F (36.8 C)     Temp Source 03/29/18 0904 Oral     SpO2 03/29/18 0904 96 %     Weight 03/29/18 0905 175 lb (79.4 kg)     Height 03/29/18 0905 5' 5"  (1.651 m)     Head Circumference --      Peak Flow --      Pain Score 03/29/18 0905 5     Pain Loc --      Pain Edu? --      Excl. in [place]? --    Constitutional: Alert and oriented. Well appearing and in no acute distress. Eyes: Conjunctivae are normal.  Head: Atraumatic. Neck: No stridor.   Cardiovascular: Normal rate, regular rhythm. Grossly normal heart sounds.  Good peripheral circulation. Respiratory: Normal respiratory effort.  No retractions. Lungs CTAB. Gastrointestinal: Soft and nontender. No distention.  Musculoskeletal: On examination of left shoulder there is no gross deformity however there is some soft tissue tenderness on palpation especially at the [facility] joint and posteriorly.  There is also some tenderness noted to the proximal humerus.  Range of motion is minimally restricted but no crepitus is noted with range of motion.  Patient is able to abduct above shoulder height before stopping secondary to discomfort.  Skin is intact.  No ecchymosis or abrasions were seen.  Pulse is present and capillary refill is less than 3 seconds distally.  Nontender to palpation left elbow and range of motion is without restriction. Neurologic:  Normal speech and language. No gross focal neurologic deficits are appreciated. No gait instability. Skin:  Skin is warm, dry and intact.  No ecchymosis or abrasions were seen. Psychiatric: Mood and affect are normal. Speech and behavior are normal.  ____________________________________________   LABS (all labs ordered are listed, but only abnormal results are displayed)  Labs Reviewed - No data to display  RADIOLOGY  ED MD interpretation:   Left  shoulder x-ray shows some degenerative changes.  No acute bony injury noted.  Official radiology report(s): Dg Shoulder Left  Result Date: 03/29/2018 CLINICAL DATA:  Pain and decreased range of motion EXAM: LEFT SHOULDER - 2+ VIEW COMPARISON:  None. FINDINGS: Oblique, Y scapular, and axillary images were obtained. There is no fracture or dislocation. There is osteoarthritic change in the acromioclavicular joint. The glenohumeral joint appears unremarkable. No erosive change. Visualized left lung is clear. There is aortic atherosclerosis. IMPRESSION: No fracture or dislocation. 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 There is aortic atherosclerosis. Aortic Atherosclerosis (ICD10-I70.0). Electronically Signed   By: Lowella Grip III M.D.  On: 03/29/2018 10:45    ____________________________________________   PROCEDURES  Procedure(s) performed: None  Procedures  Critical Care performed: No  ____________________________________________   INITIAL IMPRESSION / ASSESSMENT AND PLAN / ED COURSE  As part of my medical decision making, I reviewed the following data within the electronic MEDICAL RECORD NUMBER Notes from prior ED visits and [place] Controlled Substance Database  Patient presents to the ED with complaint of left shoulder pain after she fell approximately 5 days ago.  Patient exam was low suspicion for fracture.  X-ray was reassuring and patient was told that there was some degenerative changes at which time she said she did not have arthritis.  Patient states that she does not want to take any medication and denied any prescription.  Patient will follow up with her PCP if any continued problems.  She will comply with ice as needed for pain as needed.  ____________________________________________   FINAL CLINICAL IMPRESSION(S) / ED DIAGNOSES  Final diagnoses:  Contusion of left shoulder, initial encounter     ED Discharge Orders    None       Note:  This  document was prepared using Dragon voice recognition software and may include unintentional dictation errors.    Johnn Hai, PA-C 03/29/18 1724    Lavonia Drafts, MD 03/30/18 [phone]

## 2018-03-29 NOTE — ED Notes (Signed)
Fell on arm sunday, only hurts in certain positions, mostly upward position. Tripped on shoes. Claims left knee is "tight", notable scrape on knee. Did not hit head when fell, pt takes aspirin.

## 2018-03-29 NOTE — ED Triage Notes (Signed)
Pt reports she fell on Sunday and when she did she had her pocketbook and 2 books in her left arm and she fell on that side. Pt reports certain ways she moves it causes pain. Pt reports it is better from when she fell but still not right.

## 2018-04-01 ENCOUNTER — Other Ambulatory Visit: Payer: Self-pay | Admitting: Family Medicine

## 2018-04-05 ENCOUNTER — Other Ambulatory Visit: Payer: Self-pay | Admitting: Family Medicine

## 2018-04-09 ENCOUNTER — Ambulatory Visit (INDEPENDENT_AMBULATORY_CARE_PROVIDER_SITE_OTHER): Payer: Medicare Other | Admitting: Family Medicine

## 2018-04-09 ENCOUNTER — Encounter: Payer: Self-pay | Admitting: Family Medicine

## 2018-04-09 DIAGNOSIS — S40012A Contusion of left shoulder, initial encounter: Secondary | ICD-10-CM | POA: Diagnosis not present

## 2018-04-09 NOTE — Patient Instructions (Signed)
Nice to see you.   Please monitor your left shoulder and if it does not continue to improve or if it worsens please let us know and we can have you see a specialist.

## 2018-04-11 DIAGNOSIS — S40012A Contusion of left shoulder, initial encounter: Secondary | ICD-10-CM | POA: Insufficient documentation

## 2018-04-11 NOTE — Progress Notes (Signed)
  [name], MD Phone: [phone]  Brianna Dodson is a 84 y.o. female who presents today for follow-up.  CC: Left shoulder discomfort status post fall  Patient was evaluated in the emergency department after tripping while walking out of church.  She fell onto her left shoulder.  She notes it is 80 to 90% better than when she fell.  She had no loss of consciousness.  No head injury.  She did land on her knee though there was no discomfort there.  Her left shoulder x-ray did not reveal a fracture or dislocation.  It revealed osteoarthritic changes.  She is not taking any medication for this.  The fall occurred on 03/29/2018.  No other recent falls.  Social History   Tobacco Use  Smoking Status Never Smoker  Smokeless Tobacco Never Used     ROS see history of present illness  Objective  Physical Exam Vitals:   04/09/18 1454  BP: 122/68  Pulse: 74  Temp: 98.2 F (36.8 C)  SpO2: 96%    BP Readings from Last 3 Encounters:  04/09/18 122/68  03/29/18 (!) 153/63  03/15/18 130/70   Wt Readings from Last 3 Encounters:  04/09/18 176 lb 9.6 oz (80.1 kg)  03/29/18 175 lb (79.4 kg)  03/15/18 179 lb 6 oz (81.4 kg)    Physical Exam  Constitutional: No distress.  Cardiovascular: Normal rate, regular rhythm and normal heart sounds.  Pulmonary/Chest: Effort normal and breath sounds normal.  Musculoskeletal: She exhibits no edema.  Slight discomfort on internal rotation of the left shoulder, no discomfort on external rotation or abduction, full range of motion, no soft tissue or bony tenderness of the left shoulder, full range of motion right shoulder with no tenderness, negative empty can bilaterally  Neurological: She is alert.  Skin: Skin is warm and dry. She is not diaphoretic.     Assessment/Plan: Please see individual problem list.  Contusion of left shoulder I suspect she has a soft tissue injury to the left shoulder.  It continues to improve.  She will monitor and if  worsening or not continuing to improve let us know.  Discussed walking carefully and monitoring her walking environment to help prevent falls.   No orders of the defined types were placed in this encounter.   No orders of the defined types were placed in this encounter.    [name], MD [place]

## 2018-04-11 NOTE — Assessment & Plan Note (Signed)
I suspect she has a soft tissue injury to the left shoulder.  It continues to improve.  She will monitor and if worsening or not continuing to improve let us know.  Discussed walking carefully and monitoring her walking environment to help prevent falls.

## 2018-04-29 ENCOUNTER — Telehealth: Payer: Self-pay

## 2018-04-29 DIAGNOSIS — M25512 Pain in left shoulder: Secondary | ICD-10-CM

## 2018-04-29 NOTE — Telephone Encounter (Signed)
Patient would like referral to [facility] if Dr. Caryl Bis approves.  She would like to see Buckner Malta

## 2018-04-29 NOTE — Telephone Encounter (Signed)
Copied from [place] [phone]. Topic: Referral - Request for Referral >> Apr 29, 2018 11:11 AM [place], Lanice Schwab wrote: Has patient seen PCP for this complaint?Yes *If NO, is insurance requiring patient see PCP for this issue before PCP can refer them? Referral for which specialty:PT Preferred provider/office: [facility] [phone] Reason for referral: Dr. Caryl Bis said if patient not doing better after fall she would need PT.  Patient would like referral to [facility] if Dr. Caryl Bis approves.  She would like to see Brianna Dodson.

## 2018-04-29 NOTE — Telephone Encounter (Signed)
Referral placed.  Sending to [facility] to get this set up.

## 2018-04-29 NOTE — Addendum Note (Signed)
Addended by: [name] on: 04/29/2018 06:06 PM   Modules accepted: Orders

## 2018-05-10 ENCOUNTER — Ambulatory Visit: Payer: Commercial Indemnity | Admitting: Family Medicine

## 2018-05-17 ENCOUNTER — Ambulatory Visit (INDEPENDENT_AMBULATORY_CARE_PROVIDER_SITE_OTHER): Payer: Medicare Other

## 2018-05-17 ENCOUNTER — Encounter: Payer: Self-pay | Admitting: Family Medicine

## 2018-05-17 ENCOUNTER — Ambulatory Visit (INDEPENDENT_AMBULATORY_CARE_PROVIDER_SITE_OTHER): Payer: Medicare Other | Admitting: Family Medicine

## 2018-05-17 VITALS — BP 134/62 | HR 83 | Temp 97.8°F | Resp 15 | Ht 65.0 in | Wt 174.8 lb

## 2018-05-17 DIAGNOSIS — M79622 Pain in left upper arm: Secondary | ICD-10-CM

## 2018-05-17 DIAGNOSIS — N183 Chronic kidney disease, stage 3 unspecified: Secondary | ICD-10-CM

## 2018-05-17 DIAGNOSIS — E1122 Type 2 diabetes mellitus with diabetic chronic kidney disease: Secondary | ICD-10-CM

## 2018-05-17 DIAGNOSIS — M25512 Pain in left shoulder: Secondary | ICD-10-CM

## 2018-05-17 DIAGNOSIS — E785 Hyperlipidemia, unspecified: Secondary | ICD-10-CM | POA: Diagnosis not present

## 2018-05-17 DIAGNOSIS — S40012A Contusion of left shoulder, initial encounter: Secondary | ICD-10-CM | POA: Diagnosis not present

## 2018-05-17 DIAGNOSIS — R2232 Localized swelling, mass and lump, left upper limb: Secondary | ICD-10-CM | POA: Diagnosis not present

## 2018-05-17 DIAGNOSIS — I10 Essential (primary) hypertension: Secondary | ICD-10-CM

## 2018-05-17 DIAGNOSIS — M898X2 Other specified disorders of bone, upper arm: Secondary | ICD-10-CM

## 2018-05-17 LAB — POCT GLYCOSYLATED HEMOGLOBIN (HGB A1C): Hemoglobin A1C: 9.4 % — AB (ref 4.0–5.6)

## 2018-05-17 NOTE — Progress Notes (Signed)
[name], MD Phone: [phone]  Brianna Dodson is a 84 y.o. female who presents today for f/u.  CC: Diabetes, hyperlipidemia, hypertension, left shoulder issue  Diabetes: Taking metformin and Tradjenta.  Does not check her sugars often though has been around 180.  No polyuria or polydipsia.  No hypoglycemia.  Hyperlipidemia: Taking Crestor.  No right upper quadrant pain or myalgias.  Hypertension: Not checking at home.  Taking amlodipine.  No chest pain, shortness of breath, or edema.  Left shoulder issue: Patient notes she continues to have issues with her left shoulder after a fall previously.  She had negative x-rays in the ED.  Notes it is hard to abduct it.  Notes she feels a bump along the lateral portion of her humerus.  Social History   Tobacco Use  Smoking Status Never Smoker  Smokeless Tobacco Never Used     ROS see history of present illness  Objective  Physical Exam Vitals:   05/17/18 1052  BP: 134/62  Pulse: 83  Resp: 15  Temp: 97.8 F (36.6 C)  SpO2: 97%    BP Readings from Last 3 Encounters:  05/17/18 134/62  04/09/18 122/68  03/29/18 (!) 153/63   Wt Readings from Last 3 Encounters:  05/17/18 174 lb 12.8 oz (79.3 kg)  04/09/18 176 lb 9.6 oz (80.1 kg)  03/29/18 175 lb (79.4 kg)    Physical Exam  Constitutional: No distress.  Cardiovascular: Normal rate, regular rhythm and normal heart sounds.  Pulmonary/Chest: Effort normal and breath sounds normal.  Musculoskeletal: She exhibits no edema.  Bilateral shoulders with no tenderness, she has full range of motion actively and passively bilateral shoulders with some discomfort on abduction in the left, positive empty can on the left, there is a possible palpable lump around the area that her deltoid inserts into her humerus, this is nontender  Neurological: She is alert.  Skin: Skin is warm and dry. She is not diaphoretic.     Assessment/Plan: Please see individual problem  list.  Contusion of left shoulder Patient continues to have issues with this.  Given palpable lesion in left humerus we will x-ray her left humerus.  Will refer to orthopedics.  DM type 2 (diabetes mellitus, type 2) Check A1c.  Patient is hesitant to add any additional therapy.  She will continue her current medication.  Essential hypertension Well-controlled.  Continue current regimen.  HLD (hyperlipidemia) Continue Crestor.   Orders Placed This Encounter  Procedures  . DG Humerus Left    Standing Status:   Future    Number of Occurrences:   1    Standing Expiration Date:   07/18/2019    Order Specific Question:   Reason for Exam (SYMPTOM  OR DIAGNOSIS REQUIRED)    Answer:   palpable lump left mid humerus s/p fall in september    Order Specific Question:   Preferred imaging location?    Answer:   [facility] Specific Question:   Radiology Contrast Protocol - do NOT remove file path    Answer:   [email or web address]  . Ambulatory referral to Orthopedic Surgery    Referral Priority:   Routine    Referral Type:   Surgical    Referral Reason:   Specialty Services Required    Requested Specialty:   Orthopedic Surgery    Number of Visits Requested:   1  . POCT HgB A1C    No orders of the defined types were placed in this encounter.  [name], MD [place]

## 2018-05-17 NOTE — Assessment & Plan Note (Signed)
Check A1c.  Patient is hesitant to add any additional therapy.  She will continue her current medication.

## 2018-05-17 NOTE — Assessment & Plan Note (Signed)
Well-controlled.  Continue current regimen. 

## 2018-05-17 NOTE — Assessment & Plan Note (Signed)
Patient continues to have issues with this.  Given palpable lesion in left humerus we will x-ray her left humerus.  Will refer to orthopedics.

## 2018-05-17 NOTE — Assessment & Plan Note (Signed)
Continue Crestor 

## 2018-05-17 NOTE — Patient Instructions (Signed)
Nice to see you. We will get you to see orthopedics for your left shoulder. We will check an A1c today and contact you with the results.

## 2018-05-23 ENCOUNTER — Other Ambulatory Visit: Payer: Self-pay | Admitting: Family Medicine

## 2018-05-23 MED ORDER — GLIMEPIRIDE 1 MG PO TABS: 1.0000 mg | ORAL_TABLET | Freq: Every day | ORAL | 2 refills | Status: DC

## 2018-05-27 ENCOUNTER — Other Ambulatory Visit: Payer: Self-pay | Admitting: Family Medicine

## 2018-05-27 ENCOUNTER — Telehealth: Payer: Self-pay | Admitting: Family Medicine

## 2018-05-27 DIAGNOSIS — N183 Chronic kidney disease, stage 3 unspecified: Secondary | ICD-10-CM

## 2018-05-27 DIAGNOSIS — M7989 Other specified soft tissue disorders: Secondary | ICD-10-CM

## 2018-05-27 NOTE — Telephone Encounter (Signed)
Copied from [place] [id number]. Topic: General - Other >> May 27, 2018  3:37 PM Bea Graff, NT wrote: Reason for CRM: Pt returning call to office. No message left in regards to what the call was about. Please advise.

## 2018-05-27 NOTE — Telephone Encounter (Signed)
Called and spoke with patient about getting her set up for lab work before she has her MRI.

## 2018-06-03 ENCOUNTER — Other Ambulatory Visit (INDEPENDENT_AMBULATORY_CARE_PROVIDER_SITE_OTHER): Payer: Medicare Other

## 2018-06-03 DIAGNOSIS — N183 Chronic kidney disease, stage 3 unspecified: Secondary | ICD-10-CM

## 2018-06-03 LAB — BASIC METABOLIC PANEL
BUN: 18 mg/dL (ref 6–23)
CO2: 26 mEq/L (ref 19–32)
Calcium: 9.4 mg/dL (ref 8.4–10.5)
Chloride: 101 mEq/L (ref 96–112)
Creatinine, Ser: 1.49 mg/dL — ABNORMAL HIGH (ref 0.40–1.20)
GFR: 43.02 mL/min — ABNORMAL LOW (ref >60.00)
Glucose, Bld: 237 mg/dL — ABNORMAL HIGH (ref 70–99)
Potassium: 4.2 mEq/L (ref 3.5–5.1)
Sodium: 137 mEq/L (ref 135–145)

## 2018-06-10 DIAGNOSIS — M7582 Other shoulder lesions, left shoulder: Secondary | ICD-10-CM | POA: Diagnosis not present

## 2018-06-24 DIAGNOSIS — M25612 Stiffness of left shoulder, not elsewhere classified: Secondary | ICD-10-CM | POA: Diagnosis not present

## 2018-06-24 DIAGNOSIS — M6281 Muscle weakness (generalized): Secondary | ICD-10-CM | POA: Diagnosis not present

## 2018-06-24 DIAGNOSIS — M25512 Pain in left shoulder: Secondary | ICD-10-CM | POA: Diagnosis not present

## 2018-06-24 DIAGNOSIS — M7582 Other shoulder lesions, left shoulder: Secondary | ICD-10-CM | POA: Diagnosis not present

## 2018-07-04 DIAGNOSIS — M7582 Other shoulder lesions, left shoulder: Secondary | ICD-10-CM | POA: Diagnosis not present

## 2018-07-09 DIAGNOSIS — M7582 Other shoulder lesions, left shoulder: Secondary | ICD-10-CM | POA: Diagnosis not present

## 2018-07-10 ENCOUNTER — Encounter (INDEPENDENT_AMBULATORY_CARE_PROVIDER_SITE_OTHER): Payer: Medicare Other | Admitting: Ophthalmology

## 2018-07-10 DIAGNOSIS — I10 Essential (primary) hypertension: Secondary | ICD-10-CM | POA: Diagnosis not present

## 2018-07-10 DIAGNOSIS — H35033 Hypertensive retinopathy, bilateral: Secondary | ICD-10-CM

## 2018-07-10 DIAGNOSIS — E113312 Type 2 diabetes mellitus with moderate nonproliferative diabetic retinopathy with macular edema, left eye: Secondary | ICD-10-CM | POA: Diagnosis not present

## 2018-07-10 DIAGNOSIS — E11311 Type 2 diabetes mellitus with unspecified diabetic retinopathy with macular edema: Secondary | ICD-10-CM | POA: Diagnosis not present

## 2018-07-10 DIAGNOSIS — H43813 Vitreous degeneration, bilateral: Secondary | ICD-10-CM

## 2018-07-10 DIAGNOSIS — E113391 Type 2 diabetes mellitus with moderate nonproliferative diabetic retinopathy without macular edema, right eye: Secondary | ICD-10-CM

## 2018-07-11 DIAGNOSIS — M25512 Pain in left shoulder: Secondary | ICD-10-CM | POA: Diagnosis not present

## 2018-07-11 DIAGNOSIS — M7582 Other shoulder lesions, left shoulder: Secondary | ICD-10-CM | POA: Diagnosis not present

## 2018-07-12 ENCOUNTER — Telehealth: Payer: Self-pay | Admitting: Family Medicine

## 2018-07-12 DIAGNOSIS — E1122 Type 2 diabetes mellitus with diabetic chronic kidney disease: Secondary | ICD-10-CM

## 2018-07-12 DIAGNOSIS — N183 Chronic kidney disease, stage 3 unspecified: Secondary | ICD-10-CM

## 2018-07-12 NOTE — Telephone Encounter (Signed)
Pt is requesting a referral to go to [facility] to see Germain Osgood, MD for A1C. Pt's eye doctor told her that her A1C is high.

## 2018-07-12 NOTE — Telephone Encounter (Signed)
Sent to PCP to place referral  

## 2018-07-13 NOTE — Telephone Encounter (Signed)
Referral placed.

## 2018-07-15 NOTE — Telephone Encounter (Signed)
Called patient and left a VM that referral has been placed.

## 2018-07-16 DIAGNOSIS — M7582 Other shoulder lesions, left shoulder: Secondary | ICD-10-CM | POA: Diagnosis not present

## 2018-07-16 NOTE — Telephone Encounter (Signed)
Pt stated that she went there the other day and they stated that they haven't received the referral. Just an FYI. Sent to [place]. Unsure if you have even had the opportunity to do this. Thanks.

## 2018-07-16 NOTE — Telephone Encounter (Signed)
Called and spoke with patient. Pt advised and voiced understanding.  

## 2018-07-18 DIAGNOSIS — M7582 Other shoulder lesions, left shoulder: Secondary | ICD-10-CM | POA: Diagnosis not present

## 2018-07-25 DIAGNOSIS — E1122 Type 2 diabetes mellitus with diabetic chronic kidney disease: Secondary | ICD-10-CM | POA: Diagnosis not present

## 2018-07-25 DIAGNOSIS — E1165 Type 2 diabetes mellitus with hyperglycemia: Secondary | ICD-10-CM | POA: Diagnosis not present

## 2018-07-25 DIAGNOSIS — I10 Essential (primary) hypertension: Secondary | ICD-10-CM | POA: Diagnosis not present

## 2018-07-25 DIAGNOSIS — E785 Hyperlipidemia, unspecified: Secondary | ICD-10-CM | POA: Diagnosis not present

## 2018-07-25 DIAGNOSIS — E1169 Type 2 diabetes mellitus with other specified complication: Secondary | ICD-10-CM | POA: Insufficient documentation

## 2018-07-25 DIAGNOSIS — N183 Chronic kidney disease, stage 3 (moderate): Secondary | ICD-10-CM | POA: Diagnosis not present

## 2018-08-26 DIAGNOSIS — I129 Hypertensive chronic kidney disease with stage 1 through stage 4 chronic kidney disease, or unspecified chronic kidney disease: Secondary | ICD-10-CM | POA: Diagnosis not present

## 2018-08-26 DIAGNOSIS — N183 Chronic kidney disease, stage 3 (moderate): Secondary | ICD-10-CM | POA: Diagnosis not present

## 2018-08-26 DIAGNOSIS — R809 Proteinuria, unspecified: Secondary | ICD-10-CM | POA: Diagnosis not present

## 2018-08-26 DIAGNOSIS — E1122 Type 2 diabetes mellitus with diabetic chronic kidney disease: Secondary | ICD-10-CM | POA: Diagnosis not present

## 2018-08-28 ENCOUNTER — Other Ambulatory Visit: Payer: Self-pay | Admitting: Family Medicine

## 2018-09-02 ENCOUNTER — Other Ambulatory Visit: Payer: Self-pay | Admitting: Family Medicine

## 2018-09-12 DIAGNOSIS — E1169 Type 2 diabetes mellitus with other specified complication: Secondary | ICD-10-CM | POA: Diagnosis not present

## 2018-09-12 DIAGNOSIS — I10 Essential (primary) hypertension: Secondary | ICD-10-CM | POA: Diagnosis not present

## 2018-09-12 DIAGNOSIS — E1165 Type 2 diabetes mellitus with hyperglycemia: Secondary | ICD-10-CM | POA: Diagnosis not present

## 2018-09-12 DIAGNOSIS — E785 Hyperlipidemia, unspecified: Secondary | ICD-10-CM | POA: Diagnosis not present

## 2018-09-12 DIAGNOSIS — N183 Chronic kidney disease, stage 3 (moderate): Secondary | ICD-10-CM | POA: Diagnosis not present

## 2018-09-12 DIAGNOSIS — E1122 Type 2 diabetes mellitus with diabetic chronic kidney disease: Secondary | ICD-10-CM | POA: Diagnosis not present

## 2018-09-16 ENCOUNTER — Ambulatory Visit: Payer: Commercial Indemnity | Admitting: Family Medicine

## 2018-09-19 ENCOUNTER — Ambulatory Visit (INDEPENDENT_AMBULATORY_CARE_PROVIDER_SITE_OTHER): Payer: Medicare Other | Admitting: Family Medicine

## 2018-09-19 ENCOUNTER — Encounter: Payer: Self-pay | Admitting: Family Medicine

## 2018-09-19 ENCOUNTER — Other Ambulatory Visit: Payer: Self-pay

## 2018-09-19 DIAGNOSIS — E1122 Type 2 diabetes mellitus with diabetic chronic kidney disease: Secondary | ICD-10-CM | POA: Diagnosis not present

## 2018-09-19 DIAGNOSIS — R200 Anesthesia of skin: Secondary | ICD-10-CM | POA: Insufficient documentation

## 2018-09-19 DIAGNOSIS — N183 Chronic kidney disease, stage 3 unspecified: Secondary | ICD-10-CM

## 2018-09-19 DIAGNOSIS — E785 Hyperlipidemia, unspecified: Secondary | ICD-10-CM

## 2018-09-19 DIAGNOSIS — I10 Essential (primary) hypertension: Secondary | ICD-10-CM

## 2018-09-19 DIAGNOSIS — R202 Paresthesia of skin: Secondary | ICD-10-CM | POA: Diagnosis not present

## 2018-09-19 NOTE — Patient Instructions (Addendum)
Nice to see you.  Please continue with your current regimen.

## 2018-09-19 NOTE — Assessment & Plan Note (Signed)
Continue Crestor 

## 2018-09-19 NOTE — Assessment & Plan Note (Signed)
She will continue to see nephrology.  Discussed avoiding NSAIDs.

## 2018-09-19 NOTE — Progress Notes (Signed)
[name], MD Phone: [phone]  Brianna Dodson is a 85 y.o. female who presents today for f/u.  HYPERTENSION Disease Monitoring: Blood pressure range-not checking Chest pain- no      Dyspnea- no Medications: Compliance- amlodipine, lisinopril   Edema- no  DIABETES Disease Monitoring: Blood Sugar ranges-98-130, excursion to 198 after pasta Polyuria/phagia/dipsia- no      Optho- see's every 6 months, saw in January Medications: Compliance- taking glimeperide, metformin, actos Hypoglycemic symptoms- no  HYPERLIPIDEMIA Disease Monitoring: See symptoms for Hypertension Medications: Compliance- crestor Right upper quadrant pain- no  Muscle aches- no  Chronic kidney disease: She continues to follow with nephrology.  She avoids NSAIDs.  Ulnar aspect hand tingling: Patient notes this has been going on intermittently for some time now.  Typically only occurs at night when her arms are flexed at the elbows.  Notes she will get up and move her arms around and this will go away.  Rarely happens during the day when she is driving.  Always resolves with change in position.  No numbness or tingling elsewhere.  No weakness.  She notes no pain with this.  Social History   Tobacco Use  Smoking Status Never Smoker  Smokeless Tobacco Never Used     ROS see history of present illness  Objective  Physical Exam Vitals:   09/19/18 0932  BP: 130/70  Pulse: 81  Temp: 97.7 F (36.5 C)  SpO2: 99%    BP Readings from Last 3 Encounters:  09/19/18 130/70  05/17/18 134/62  04/09/18 122/68   Wt Readings from Last 3 Encounters:  09/19/18 179 lb 3.2 oz (81.3 kg)  05/17/18 174 lb 12.8 oz (79.3 kg)  04/09/18 176 lb 9.6 oz (80.1 kg)    Physical Exam Constitutional:      General: She is not in acute distress.    Appearance: She is not diaphoretic.  Cardiovascular:     Rate and Rhythm: Normal rate and regular rhythm.     Heart sounds: Normal heart sounds.  Pulmonary:     Effort:  Pulmonary effort is normal.     Breath sounds: Normal breath sounds.  Musculoskeletal:     Right lower leg: No edema.     Left lower leg: No edema.  Skin:    General: Skin is warm and dry.  Neurological:     Mental Status: She is alert.     Comments: CN 2-12 intact, 5/5 strength in bilateral biceps, triceps, grip, quads, hamstrings, plantar and dorsiflexion, sensation to light touch intact in bilateral UE and LE, normal gait, 2+ patellar, biceps, and brachioradialis reflexes, negative Tinel's at wrist and ulnar groove      Assessment/Plan: Please see individual problem list.  Essential hypertension Well-controlled.  She will continue her current regimen.  I discussed checking renal function though she notes she just had this checked and she wants this to be monitored through her nephrologist.  CKD stage 3 due to type 2 diabetes mellitus She will continue to see nephrology.  Discussed avoiding NSAIDs.  DM type 2 (diabetes mellitus, type 2) Patient reports an A1c in the sevens.  She reports this was just recently checked.  Care everywhere is not updating her lab results at this time.  She will continue her current regimen and continue to see endocrinology.  HLD (hyperlipidemia) Continue Crestor.  Positional numbness and tingling in both hands, ulnar aspect I suspect this is related to a positional issue with nerve compression when her elbows are flexed.  She  is neurologically intact.  Discussed placing a pillow between her upper arm and lower arm or wrapping her elbows and towels while sleeping.  Discussed if she develops any additional symptoms or new or worsening symptoms she should be reevaluated.    No orders of the defined types were placed in this encounter.   No orders of the defined types were placed in this encounter.    [name], MD [place]

## 2018-09-19 NOTE — Assessment & Plan Note (Signed)
Patient reports an A1c in the sevens.  She reports this was just recently checked.  Care everywhere is not updating her lab results at this time.  She will continue her current regimen and continue to see endocrinology.

## 2018-09-19 NOTE — Assessment & Plan Note (Signed)
Well-controlled.  She will continue her current regimen.  I discussed checking renal function though she notes she just had this checked and she wants this to be monitored through her nephrologist.

## 2018-09-19 NOTE — Assessment & Plan Note (Signed)
I suspect this is related to a positional issue with nerve compression when her elbows are flexed.  She is neurologically intact.  Discussed placing a pillow between her upper arm and lower arm or wrapping her elbows and towels while sleeping.  Discussed if she develops any additional symptoms or new or worsening symptoms she should be reevaluated.

## 2019-01-09 ENCOUNTER — Encounter (INDEPENDENT_AMBULATORY_CARE_PROVIDER_SITE_OTHER): Payer: Medicare Other | Admitting: Ophthalmology

## 2019-01-09 ENCOUNTER — Other Ambulatory Visit: Payer: Self-pay

## 2019-01-09 DIAGNOSIS — E11319 Type 2 diabetes mellitus with unspecified diabetic retinopathy without macular edema: Secondary | ICD-10-CM | POA: Diagnosis not present

## 2019-01-09 DIAGNOSIS — H35033 Hypertensive retinopathy, bilateral: Secondary | ICD-10-CM

## 2019-01-09 DIAGNOSIS — E113393 Type 2 diabetes mellitus with moderate nonproliferative diabetic retinopathy without macular edema, bilateral: Secondary | ICD-10-CM

## 2019-01-09 DIAGNOSIS — I10 Essential (primary) hypertension: Secondary | ICD-10-CM | POA: Diagnosis not present

## 2019-01-09 DIAGNOSIS — H43813 Vitreous degeneration, bilateral: Secondary | ICD-10-CM

## 2019-01-24 ENCOUNTER — Ambulatory Visit (INDEPENDENT_AMBULATORY_CARE_PROVIDER_SITE_OTHER): Payer: Medicare Other | Admitting: Family Medicine

## 2019-01-24 ENCOUNTER — Encounter: Payer: Self-pay | Admitting: Family Medicine

## 2019-01-24 ENCOUNTER — Other Ambulatory Visit: Payer: Self-pay

## 2019-01-24 VITALS — BP 130/80 | HR 74 | Temp 98.5°F | Ht 65.0 in | Wt 185.1 lb

## 2019-01-24 DIAGNOSIS — E785 Hyperlipidemia, unspecified: Secondary | ICD-10-CM

## 2019-01-24 DIAGNOSIS — I10 Essential (primary) hypertension: Secondary | ICD-10-CM

## 2019-01-24 DIAGNOSIS — E1122 Type 2 diabetes mellitus with diabetic chronic kidney disease: Secondary | ICD-10-CM | POA: Diagnosis not present

## 2019-01-24 DIAGNOSIS — N183 Chronic kidney disease, stage 3 unspecified: Secondary | ICD-10-CM

## 2019-01-24 MED ORDER — GLIMEPIRIDE 1 MG PO TABS: 2.0000 mg | ORAL_TABLET | Freq: Every day | ORAL | Status: DC

## 2019-01-24 NOTE — Progress Notes (Signed)
  [name], MD Phone: [phone]  Brianna Dodson is a 85 y.o. female who presents today for follow-up.  HYPERTENSION  Disease Monitoring  Home BP Monitoring not checking Chest pain- no    Dyspnea- no Medications  Compliance-  Taking lisinopril, amlodipine.  Edema- no  DIABETES Disease Monitoring: Blood Sugar ranges-93-130 Polyuria/phagia/dipsia- no      Optho- saw earlier this month Medications: Compliance- taking metformin and glimeperide Hypoglycemic symptoms- no Follows with endocrinology.  HYPERLIPIDEMIA Symptoms Chest pain on exertion:  no   Medications: Compliance- taking crestor Right upper quadrant pain- no  Muscle aches- no  CKD: follows with nephrology. Recent Cr increase from previously. She does not take NSAIDs.     Social History   Tobacco Use  Smoking Status Never Smoker  Smokeless Tobacco Never Used     ROS see history of present illness  Objective  Physical Exam Vitals:   01/24/19 0957  BP: 130/80  Pulse: 74  Temp: 98.5 F (36.9 C)  SpO2: 98%    BP Readings from Last 3 Encounters:  01/24/19 130/80  09/19/18 130/70  05/17/18 134/62   Wt Readings from Last 3 Encounters:  01/24/19 185 lb 1.9 oz (84 kg)  09/19/18 179 lb 3.2 oz (81.3 kg)  05/17/18 174 lb 12.8 oz (79.3 kg)    Physical Exam Constitutional:      General: She is not in acute distress.    Appearance: She is not diaphoretic.  Cardiovascular:     Rate and Rhythm: Normal rate and regular rhythm.     Heart sounds: Normal heart sounds.  Pulmonary:     Effort: Pulmonary effort is normal.     Breath sounds: Normal breath sounds.  Musculoskeletal:     Right lower leg: No edema.     Left lower leg: No edema.  Skin:    General: Skin is warm and dry.  Neurological:     Mental Status: She is alert.      Assessment/Plan: Please see individual problem list.  Essential hypertension Adequately controlled.  Continue current regimen.  CKD stage 3 due to type 2  diabetes mellitus I discussed rechecking her kidney function today given her prior bump in creatinine back in March.  She declined this noting that she has follow-up with her nephrologist in the near future and she will have lab work completed through them.  DM type 2 (diabetes mellitus, type 2) Most recent A1c improved.  She will continue her current medication and follow with endocrinology.  HLD (hyperlipidemia) Continue Crestor.  Most recent lipid panel with adequately controlled cholesterol for age.   Health Maintenance: Patient declines tetanus vaccine and pneumonia vaccine.  No orders of the defined types were placed in this encounter.   Meds ordered this encounter  Medications  . glimepiride (AMARYL) 1 MG tablet    Sig: Take 2 tablets (2 mg total) by mouth daily with breakfast.     [name], MD [place]

## 2019-01-24 NOTE — Patient Instructions (Addendum)
Nice to see you. Your prior lab work revealed slight worsening of your kidney function. I would like to recheck that today.  Please keep your follow-up with your kidney doctor.

## 2019-01-24 NOTE — Assessment & Plan Note (Signed)
I discussed rechecking her kidney function today given her prior bump in creatinine back in March.  She declined this noting that she has follow-up with her nephrologist in the near future and she will have lab work completed through them.

## 2019-01-24 NOTE — Assessment & Plan Note (Signed)
Most recent A1c improved.  She will continue her current medication and follow with endocrinology.

## 2019-01-24 NOTE — Assessment & Plan Note (Signed)
Adequately controlled.  Continue current regimen. 

## 2019-01-24 NOTE — Assessment & Plan Note (Signed)
Continue Crestor.  Most recent lipid panel with adequately controlled cholesterol for age.

## 2019-02-26 DIAGNOSIS — E1122 Type 2 diabetes mellitus with diabetic chronic kidney disease: Secondary | ICD-10-CM | POA: Diagnosis not present

## 2019-02-26 DIAGNOSIS — R809 Proteinuria, unspecified: Secondary | ICD-10-CM | POA: Diagnosis not present

## 2019-02-26 DIAGNOSIS — N2581 Secondary hyperparathyroidism of renal origin: Secondary | ICD-10-CM | POA: Diagnosis not present

## 2019-02-26 DIAGNOSIS — I129 Hypertensive chronic kidney disease with stage 1 through stage 4 chronic kidney disease, or unspecified chronic kidney disease: Secondary | ICD-10-CM | POA: Diagnosis not present

## 2019-02-26 DIAGNOSIS — N183 Chronic kidney disease, stage 3 (moderate): Secondary | ICD-10-CM | POA: Diagnosis not present

## 2019-02-28 DIAGNOSIS — D631 Anemia in chronic kidney disease: Secondary | ICD-10-CM | POA: Diagnosis not present

## 2019-02-28 DIAGNOSIS — N2581 Secondary hyperparathyroidism of renal origin: Secondary | ICD-10-CM | POA: Diagnosis not present

## 2019-02-28 DIAGNOSIS — R809 Proteinuria, unspecified: Secondary | ICD-10-CM | POA: Diagnosis not present

## 2019-02-28 DIAGNOSIS — N184 Chronic kidney disease, stage 4 (severe): Secondary | ICD-10-CM | POA: Diagnosis not present

## 2019-02-28 DIAGNOSIS — E1122 Type 2 diabetes mellitus with diabetic chronic kidney disease: Secondary | ICD-10-CM | POA: Diagnosis not present

## 2019-02-28 DIAGNOSIS — I129 Hypertensive chronic kidney disease with stage 1 through stage 4 chronic kidney disease, or unspecified chronic kidney disease: Secondary | ICD-10-CM | POA: Diagnosis not present

## 2019-03-13 ENCOUNTER — Other Ambulatory Visit (HOSPITAL_COMMUNITY): Payer: Self-pay | Admitting: Nephrology

## 2019-03-13 ENCOUNTER — Other Ambulatory Visit: Payer: Self-pay | Admitting: Nephrology

## 2019-03-13 DIAGNOSIS — N179 Acute kidney failure, unspecified: Secondary | ICD-10-CM

## 2019-03-13 DIAGNOSIS — N184 Chronic kidney disease, stage 4 (severe): Secondary | ICD-10-CM

## 2019-03-15 ENCOUNTER — Other Ambulatory Visit: Payer: Self-pay

## 2019-03-15 ENCOUNTER — Emergency Department: Payer: Medicare Other

## 2019-03-15 ENCOUNTER — Inpatient Hospital Stay
Admission: EM | Admit: 2019-03-15 | Discharge: 2019-03-16 | DRG: 065 | Disposition: A | Discharge status: Home Health | Payer: Medicare Other | Attending: Internal Medicine | Admitting: Internal Medicine

## 2019-03-15 ENCOUNTER — Encounter: Payer: Self-pay | Admitting: Emergency Medicine

## 2019-03-15 DIAGNOSIS — N183 Chronic kidney disease, stage 3 (moderate): Secondary | ICD-10-CM | POA: Diagnosis present

## 2019-03-15 DIAGNOSIS — S0990XA Unspecified injury of head, initial encounter: Secondary | ICD-10-CM | POA: Diagnosis not present

## 2019-03-15 DIAGNOSIS — Z79899 Other long term (current) drug therapy: Secondary | ICD-10-CM | POA: Diagnosis not present

## 2019-03-15 DIAGNOSIS — E1122 Type 2 diabetes mellitus with diabetic chronic kidney disease: Secondary | ICD-10-CM | POA: Diagnosis present

## 2019-03-15 DIAGNOSIS — Z90710 Acquired absence of both cervix and uterus: Secondary | ICD-10-CM

## 2019-03-15 DIAGNOSIS — Z7984 Long term (current) use of oral hypoglycemic drugs: Secondary | ICD-10-CM

## 2019-03-15 DIAGNOSIS — I6930 Unspecified sequelae of cerebral infarction: Secondary | ICD-10-CM | POA: Diagnosis present

## 2019-03-15 DIAGNOSIS — N390 Urinary tract infection, site not specified: Secondary | ICD-10-CM | POA: Diagnosis present

## 2019-03-15 DIAGNOSIS — Z823 Family history of stroke: Secondary | ICD-10-CM | POA: Diagnosis not present

## 2019-03-15 DIAGNOSIS — R4182 Altered mental status, unspecified: Secondary | ICD-10-CM | POA: Diagnosis not present

## 2019-03-15 DIAGNOSIS — Z7982 Long term (current) use of aspirin: Secondary | ICD-10-CM | POA: Diagnosis not present

## 2019-03-15 DIAGNOSIS — I10 Essential (primary) hypertension: Secondary | ICD-10-CM | POA: Diagnosis not present

## 2019-03-15 DIAGNOSIS — Z833 Family history of diabetes mellitus: Secondary | ICD-10-CM | POA: Diagnosis not present

## 2019-03-15 DIAGNOSIS — R29700 NIHSS score 0: Secondary | ICD-10-CM | POA: Diagnosis present

## 2019-03-15 DIAGNOSIS — I639 Cerebral infarction, unspecified: Secondary | ICD-10-CM

## 2019-03-15 DIAGNOSIS — I129 Hypertensive chronic kidney disease with stage 1 through stage 4 chronic kidney disease, or unspecified chronic kidney disease: Secondary | ICD-10-CM | POA: Diagnosis present

## 2019-03-15 DIAGNOSIS — I63233 Cerebral infarction due to unspecified occlusion or stenosis of bilateral carotid arteries: Secondary | ICD-10-CM | POA: Diagnosis not present

## 2019-03-15 DIAGNOSIS — Z03818 Encounter for observation for suspected exposure to other biological agents ruled out: Secondary | ICD-10-CM | POA: Diagnosis not present

## 2019-03-15 DIAGNOSIS — E119 Type 2 diabetes mellitus without complications: Secondary | ICD-10-CM | POA: Diagnosis not present

## 2019-03-15 DIAGNOSIS — I6340 Cerebral infarction due to embolism of unspecified cerebral artery: Secondary | ICD-10-CM | POA: Diagnosis not present

## 2019-03-15 DIAGNOSIS — I340 Nonrheumatic mitral (valve) insufficiency: Secondary | ICD-10-CM | POA: Diagnosis not present

## 2019-03-15 DIAGNOSIS — E785 Hyperlipidemia, unspecified: Secondary | ICD-10-CM | POA: Diagnosis present

## 2019-03-15 DIAGNOSIS — Z20828 Contact with and (suspected) exposure to other viral communicable diseases: Secondary | ICD-10-CM | POA: Diagnosis present

## 2019-03-15 HISTORY — DX: Cerebral infarction due to embolism of unspecified cerebral artery: I63.40

## 2019-03-15 LAB — COMPREHENSIVE METABOLIC PANEL
ALT: 17 U/L (ref 0–44)
AST: 24 U/L (ref 15–41)
Albumin: 4.1 g/dL (ref 3.5–5.0)
Alkaline Phosphatase: 49 U/L (ref 38–126)
Anion gap: 10 (ref 5–15)
BUN: 24 mg/dL — ABNORMAL HIGH (ref 8–23)
CO2: 23 mmol/L (ref 22–32)
Calcium: 8.9 mg/dL (ref 8.9–10.3)
Chloride: 102 mmol/L (ref 98–111)
Creatinine, Ser: 2.09 mg/dL — ABNORMAL HIGH (ref 0.44–1.00)
GFR calc Af Amer: 25 mL/min — ABNORMAL LOW (ref >60)
GFR calc non Af Amer: 21 mL/min — ABNORMAL LOW (ref >60)
Glucose, Bld: 348 mg/dL — ABNORMAL HIGH (ref 70–99)
Potassium: 4.9 mmol/L (ref 3.5–5.1)
Sodium: 135 mmol/L (ref 135–145)
Total Bilirubin: 0.7 mg/dL (ref 0.3–1.2)
Total Protein: 8 g/dL (ref 6.5–8.1)

## 2019-03-15 LAB — GLUCOSE, CAPILLARY
Glucose-Capillary: 325 mg/dL — ABNORMAL HIGH (ref 70–99)
Glucose-Capillary: 95 mg/dL (ref 70–99)

## 2019-03-15 LAB — URINALYSIS, ROUTINE W REFLEX MICROSCOPIC
Bilirubin Urine: NEGATIVE
Glucose, UA: 50 mg/dL — AB
Hgb urine dipstick: NEGATIVE
Ketones, ur: NEGATIVE mg/dL
Nitrite: NEGATIVE
Protein, ur: 100 mg/dL — AB
Specific Gravity, Urine: 1.014 (ref 1.005–1.030)
pH: 6 (ref 5.0–8.0)

## 2019-03-15 LAB — CBC
HCT: 29.2 % — ABNORMAL LOW (ref 36.0–46.0)
Hemoglobin: 10 g/dL — ABNORMAL LOW (ref 12.0–15.0)
MCH: 28.9 pg (ref 26.0–34.0)
MCHC: 34.2 g/dL (ref 30.0–36.0)
MCV: 84.4 fL (ref 80.0–100.0)
Platelets: 161 10*3/uL (ref 150–400)
RBC: 3.46 MIL/uL — ABNORMAL LOW (ref 3.87–5.11)
RDW: 12.1 % (ref 11.5–15.5)
WBC: 4.6 10*3/uL (ref 4.0–10.5)
nRBC: 0 % (ref 0.0–0.2)

## 2019-03-15 LAB — SARS CORONAVIRUS 2 BY RT PCR (HOSPITAL ORDER, PERFORMED IN ~~LOC~~ HOSPITAL LAB): SARS Coronavirus 2: NEGATIVE

## 2019-03-15 MED ORDER — ROSUVASTATIN CALCIUM 10 MG PO TABS
10.0000 mg | ORAL_TABLET | Freq: Every day | ORAL | Status: DC
  Administered 2019-03-16: 10 mg via ORAL
  Filled 2019-03-15: qty 1

## 2019-03-15 MED ORDER — ENOXAPARIN SODIUM 30 MG/0.3ML ~~LOC~~ SOLN
30.0000 mg | SUBCUTANEOUS | Status: DC
  Administered 2019-03-15: 30 mg via SUBCUTANEOUS
  Filled 2019-03-15: qty 0.3

## 2019-03-15 MED ORDER — INSULIN ASPART 100 UNIT/ML ~~LOC~~ SOLN: 0.0000 [IU] | Freq: Three times a day (TID) | SUBCUTANEOUS | Status: DC

## 2019-03-15 MED ORDER — ASPIRIN EC 81 MG PO TBEC
81.0000 mg | DELAYED_RELEASE_TABLET | Freq: Every day | ORAL | Status: DC
  Administered 2019-03-16: 10:00:00 81 mg via ORAL
  Filled 2019-03-15: qty 1

## 2019-03-15 MED ORDER — SODIUM CHLORIDE 0.9 % IV SOLN
1.0000 g | INTRAVENOUS | Status: DC
  Administered 2019-03-15: 1 g via INTRAVENOUS
  Filled 2019-03-15 (×2): qty 10

## 2019-03-15 MED ORDER — ASPIRIN 81 MG PO CHEW
324.0000 mg | CHEWABLE_TABLET | Freq: Once | ORAL | Status: AC
  Administered 2019-03-15: 324 mg via ORAL
  Filled 2019-03-15: qty 4

## 2019-03-15 MED ORDER — HYDRALAZINE HCL 20 MG/ML IJ SOLN: 10.0000 mg | Freq: Four times a day (QID) | INTRAMUSCULAR | Status: DC | PRN

## 2019-03-15 MED ORDER — ACETAMINOPHEN 650 MG RE SUPP: 650.0000 mg | RECTAL | Status: DC | PRN

## 2019-03-15 MED ORDER — SODIUM CHLORIDE 0.9 % IV SOLN
INTRAVENOUS | Status: DC
  Administered 2019-03-15: via INTRAVENOUS

## 2019-03-15 MED ORDER — INSULIN ASPART 100 UNIT/ML ~~LOC~~ SOLN
0.0000 [IU] | Freq: Three times a day (TID) | SUBCUTANEOUS | Status: DC
  Administered 2019-03-16 (×2): 2 [IU] via SUBCUTANEOUS
  Filled 2019-03-15 (×2): qty 1

## 2019-03-15 MED ORDER — SODIUM CHLORIDE 0.9 % IV BOLUS
500.0000 mL | Freq: Once | INTRAVENOUS | Status: AC
  Administered 2019-03-15: 17:00:00 500 mL via INTRAVENOUS

## 2019-03-15 MED ORDER — ACETAMINOPHEN 160 MG/5ML PO SOLN
650.0000 mg | ORAL | Status: DC | PRN
  Filled 2019-03-15: qty 20.3

## 2019-03-15 MED ORDER — CLOPIDOGREL BISULFATE 75 MG PO TABS
75.0000 mg | ORAL_TABLET | Freq: Every day | ORAL | Status: DC
  Administered 2019-03-16: 10:00:00 75 mg via ORAL
  Filled 2019-03-15: qty 1

## 2019-03-15 MED ORDER — INSULIN ASPART 100 UNIT/ML ~~LOC~~ SOLN: 0.0000 [IU] | Freq: Every day | SUBCUTANEOUS | Status: DC

## 2019-03-15 MED ORDER — STROKE: EARLY STAGES OF RECOVERY BOOK
Freq: Once | Status: AC
  Administered 2019-03-15: 23:00:00

## 2019-03-15 MED ORDER — ACETAMINOPHEN 325 MG PO TABS: 650.0000 mg | ORAL_TABLET | ORAL | Status: DC | PRN

## 2019-03-15 NOTE — Progress Notes (Signed)
PHARMACIST - PHYSICIAN COMMUNICATION  CONCERNING:  Enoxaparin (Lovenox) for DVT Prophylaxis    RECOMMENDATION: Patient was prescribed enoxaprin 40mg  q24 hours for VTE prophylaxis.   Filed Weights   03/15/19 1246 03/15/19 2239  Weight: 170 lb (77.1 kg) 182 lb 5.1 oz (82.7 kg)   Body mass index is 30.34 kg/m.  Estimated Creatinine Clearance: 21.7 mL/min (A) (by C-G formula based on SCr of 2.09 mg/dL (H)).  Patient is candidate for enoxaparin 30mg  every 24 hours based on CrCl <10ml/min   DESCRIPTION: Pharmacy has adjusted enoxaparin dose per [facility] policy.  Patient is now receiving enoxaparin 30mg  every 24 hours.  Pernell Dupre, PharmD, BCPS Clinical Pharmacist 03/15/2019 10:52 PM

## 2019-03-15 NOTE — ED Notes (Signed)
Patient transported to MRI 

## 2019-03-15 NOTE — ED Notes (Signed)
Per registration patient stated was leaving.
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
## 2019-03-15 NOTE — ED Provider Notes (Signed)
Patient resting comfortably.  Discussed concern for acute stroke.  Patient comfortable with plan to be admitted for further work-up.  Not allergic to aspirin, reports she was told because of her kidneys not to take certain medications.  Denies any allergies to medications.  Discussed with hospitalist Dr. Ladonna Snide was evaluated in Emergency Department on 03/15/2019 for the symptoms described in the history of present illness. She was evaluated in the context of the global COVID-19 pandemic, which necessitated consideration that the patient might be at risk for infection with the SARS-CoV-2 virus that causes COVID-19. Institutional protocols and algorithms that pertain to the evaluation of patients at risk for COVID-19 are in a state of rapid change based on information released by regulatory bodies including the CDC and federal and state organizations. These policies and algorithms were followed during the patient's care in the ED.    Delman Kitten, MD 03/15/19 1930
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
## 2019-03-16 ENCOUNTER — Inpatient Hospital Stay: Payer: Medicare Other

## 2019-03-16 ENCOUNTER — Inpatient Hospital Stay (HOSPITAL_COMMUNITY)
Admit: 2019-03-16 | Discharge: 2019-03-16 | Disposition: A | Discharge status: Home / Self Care | Payer: Medicare Other | Attending: Internal Medicine | Admitting: Internal Medicine

## 2019-03-16 DIAGNOSIS — I639 Cerebral infarction, unspecified: Secondary | ICD-10-CM

## 2019-03-16 DIAGNOSIS — I6340 Cerebral infarction due to embolism of unspecified cerebral artery: Secondary | ICD-10-CM | POA: Diagnosis not present

## 2019-03-16 DIAGNOSIS — I340 Nonrheumatic mitral (valve) insufficiency: Secondary | ICD-10-CM | POA: Diagnosis not present

## 2019-03-16 DIAGNOSIS — I63233 Cerebral infarction due to unspecified occlusion or stenosis of bilateral carotid arteries: Secondary | ICD-10-CM | POA: Diagnosis not present

## 2019-03-16 DIAGNOSIS — I10 Essential (primary) hypertension: Secondary | ICD-10-CM | POA: Diagnosis not present

## 2019-03-16 DIAGNOSIS — E119 Type 2 diabetes mellitus without complications: Secondary | ICD-10-CM | POA: Diagnosis not present

## 2019-03-16 DIAGNOSIS — R4182 Altered mental status, unspecified: Secondary | ICD-10-CM | POA: Diagnosis not present

## 2019-03-16 LAB — BASIC METABOLIC PANEL
Anion gap: 9 (ref 5–15)
BUN: 24 mg/dL — ABNORMAL HIGH (ref 8–23)
CO2: 23 mmol/L (ref 22–32)
Calcium: 8.2 mg/dL — ABNORMAL LOW (ref 8.9–10.3)
Chloride: 105 mmol/L (ref 98–111)
Creatinine, Ser: 1.99 mg/dL — ABNORMAL HIGH (ref 0.44–1.00)
GFR calc Af Amer: 26 mL/min — ABNORMAL LOW (ref >60)
GFR calc non Af Amer: 23 mL/min — ABNORMAL LOW (ref >60)
Glucose, Bld: 238 mg/dL — ABNORMAL HIGH (ref 70–99)
Potassium: 4.7 mmol/L (ref 3.5–5.1)
Sodium: 137 mmol/L (ref 135–145)

## 2019-03-16 LAB — LIPID PANEL
Cholesterol: 98 mg/dL (ref 0–200)
HDL: 31 mg/dL — ABNORMAL LOW (ref >40)
LDL Cholesterol: 40 mg/dL (ref 0–99)
Total CHOL/HDL Ratio: 3.2 RATIO
Triglycerides: 135 mg/dL (ref <150)
VLDL: 27 mg/dL (ref 0–40)

## 2019-03-16 LAB — ECHOCARDIOGRAM COMPLETE
Height: 65 in
Weight: 2917.13 oz

## 2019-03-16 LAB — GLUCOSE, CAPILLARY
Glucose-Capillary: 181 mg/dL — ABNORMAL HIGH (ref 70–99)
Glucose-Capillary: 179 mg/dL — ABNORMAL HIGH (ref 70–99)

## 2019-03-16 MED ORDER — CLOPIDOGREL BISULFATE 75 MG PO TABS: 75.0000 mg | ORAL_TABLET | Freq: Every day | ORAL | 0 refills | Status: DC

## 2019-03-16 MED ORDER — METFORMIN HCL 500 MG PO TABS: ORAL_TABLET | ORAL | 1 refills | Status: DC
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
## 2019-03-16 NOTE — Plan of Care (Signed)
  Problem: Education: Goal: Knowledge of secondary prevention will improve Outcome: Progressing Goal: Knowledge of patient specific risk factors addressed and post discharge goals established will improve Outcome: Progressing   Problem: Coping: Goal: Will verbalize positive feelings about self Outcome: Progressing Goal: Will identify appropriate support needs Outcome: Progressing   Problem: Health Behavior/Discharge Planning: Goal: Ability to manage health-related needs will improve Outcome: Progressing   Problem: Self-Care: Goal: Ability to participate in self-care as condition permits will improve Outcome: Progressing Goal: Ability to communicate needs accurately will improve Outcome: Progressing   Problem: Nutrition: Goal: Risk of aspiration will decrease Outcome: Progressing   Problem: Ischemic Stroke/TIA Tissue Perfusion: Goal: Complications of ischemic stroke/TIA will be minimized Outcome: Progressing   Problem: Health Behavior/Discharge Planning: Goal: Ability to manage health-related needs will improve Outcome: Progressing   Problem: Clinical Measurements: Goal: Ability to maintain clinical measurements within normal limits will improve Outcome: Progressing Goal: Will remain free from infection Outcome: Progressing Goal: Diagnostic test results will improve Outcome: Progressing Goal: Respiratory complications will improve Outcome: Progressing   Problem: Activity: Goal: Risk for activity intolerance will decrease Outcome: Progressing   Problem: Nutrition: Goal: Adequate nutrition will be maintained Outcome: Progressing   Problem: Coping: Goal: Level of anxiety will decrease Outcome: Progressing   Problem: Elimination: Goal: Will not experience complications related to bowel motility Outcome: Progressing   Problem: Pain Managment: Goal: General experience of comfort will improve Outcome: Progressing   Problem: Safety: Goal: Ability to remain free  from injury will improve Outcome: Progressing   Problem: Skin Integrity: Goal: Risk for impaired skin integrity will decrease Outcome: Progressing
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
## 2019-03-16 NOTE — Progress Notes (Signed)
*  PRELIMINARY RESULTS* Echocardiogram 2D Echocardiogram has been performed.  Brianna Dodson 03/16/2019, 12:28 PM

## 2019-03-16 NOTE — Progress Notes (Signed)
Family Meeting Note  Advance Directive:no  Today a meeting took place with the patient.   The following clinical team members were present during this meeting:MD  The following were discussed:Patient's diagnosis: stroke, CKD< DM, Htn , Patient's progosis: Unable to determine, and Goals for treatment: FUll code.  Discussed with pt, she would like her family to make decisions for her, and she will be a full code in any adverse event.  Additional follow-up to be provided: Neurologist.  Time spent during discussion:20 min.  Brianna Basta, MD
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
## 2019-03-17 LAB — URINE CULTURE: Culture: 30000 — AB

## 2019-03-18 ENCOUNTER — Telehealth: Payer: Self-pay | Admitting: Family Medicine

## 2019-03-18 LAB — HEMOGLOBIN A1C
Hgb A1c MFr Bld: 7.4 % — ABNORMAL HIGH (ref 4.8–5.6)
Mean Plasma Glucose: 166 mg/dL

## 2019-03-18 NOTE — Telephone Encounter (Signed)
Transition Care Management Follow-up Telephone Call  How have you been since you were released from the hospital? Patient advised she is doing well since discharge and that she does not need a walker.   Do you understand why you were in the hospital? yes   Do you understand the discharge instrcutions? yes  Items Reviewed:  Medications reviewed: yes  Allergies reviewed: yes  Dietary changes reviewed: yes  Referrals reviewed: yes   Functional Questionnaire:   Activities of Daily Living (ADLs):   She states they are independent in the following: ambulation, bathing and hygiene, feeding, continence, grooming, toileting and dressing States they require assistance with the following: Patient stated I do not need assistance and I do not need a walker"i don't understand why they sent this home with me."   Any transportation issues/concerns?: no   Any patient concerns? no   Confirmed importance and date/time of follow-up visits scheduled: yes   Confirmed with patient if condition begins to worsen call PCP or go to the ER.  Patient was given the Call-a-Nurse line [phone]: yes

## 2019-03-19 DIAGNOSIS — E1169 Type 2 diabetes mellitus with other specified complication: Secondary | ICD-10-CM | POA: Diagnosis not present

## 2019-03-19 DIAGNOSIS — E1122 Type 2 diabetes mellitus with diabetic chronic kidney disease: Secondary | ICD-10-CM | POA: Diagnosis not present

## 2019-03-19 DIAGNOSIS — E1165 Type 2 diabetes mellitus with hyperglycemia: Secondary | ICD-10-CM | POA: Diagnosis not present

## 2019-03-19 DIAGNOSIS — E785 Hyperlipidemia, unspecified: Secondary | ICD-10-CM | POA: Diagnosis not present

## 2019-03-19 DIAGNOSIS — N183 Chronic kidney disease, stage 3 (moderate): Secondary | ICD-10-CM | POA: Diagnosis not present

## 2019-03-19 DIAGNOSIS — I10 Essential (primary) hypertension: Secondary | ICD-10-CM | POA: Diagnosis not present

## 2019-03-20 ENCOUNTER — Ambulatory Visit
Admission: RE | Admit: 2019-03-20 | Discharge: 2019-03-20 | Disposition: A | Discharge status: Home / Self Care | Payer: Medicare Other | Source: Ambulatory Visit | Attending: Nephrology | Admitting: Nephrology

## 2019-03-20 ENCOUNTER — Other Ambulatory Visit: Payer: Self-pay

## 2019-03-20 DIAGNOSIS — N184 Chronic kidney disease, stage 4 (severe): Secondary | ICD-10-CM | POA: Insufficient documentation

## 2019-03-20 DIAGNOSIS — N179 Acute kidney failure, unspecified: Secondary | ICD-10-CM | POA: Insufficient documentation

## 2019-03-21 DIAGNOSIS — I69320 Aphasia following cerebral infarction: Secondary | ICD-10-CM | POA: Diagnosis not present

## 2019-03-21 DIAGNOSIS — E1122 Type 2 diabetes mellitus with diabetic chronic kidney disease: Secondary | ICD-10-CM | POA: Diagnosis not present

## 2019-03-21 DIAGNOSIS — Z7984 Long term (current) use of oral hypoglycemic drugs: Secondary | ICD-10-CM | POA: Diagnosis not present

## 2019-03-21 DIAGNOSIS — N390 Urinary tract infection, site not specified: Secondary | ICD-10-CM | POA: Diagnosis not present

## 2019-03-21 DIAGNOSIS — E785 Hyperlipidemia, unspecified: Secondary | ICD-10-CM | POA: Diagnosis not present

## 2019-03-21 DIAGNOSIS — D631 Anemia in chronic kidney disease: Secondary | ICD-10-CM | POA: Diagnosis not present

## 2019-03-21 DIAGNOSIS — Z7902 Long term (current) use of antithrombotics/antiplatelets: Secondary | ICD-10-CM | POA: Diagnosis not present

## 2019-03-21 DIAGNOSIS — I10 Essential (primary) hypertension: Secondary | ICD-10-CM | POA: Diagnosis not present

## 2019-03-21 DIAGNOSIS — Z7982 Long term (current) use of aspirin: Secondary | ICD-10-CM | POA: Diagnosis not present

## 2019-03-21 DIAGNOSIS — N183 Chronic kidney disease, stage 3 (moderate): Secondary | ICD-10-CM | POA: Diagnosis not present

## 2019-03-25 ENCOUNTER — Telehealth: Payer: Self-pay | Admitting: Family Medicine

## 2019-03-25 DIAGNOSIS — I10 Essential (primary) hypertension: Secondary | ICD-10-CM | POA: Diagnosis not present

## 2019-03-25 DIAGNOSIS — R0683 Snoring: Secondary | ICD-10-CM | POA: Diagnosis not present

## 2019-03-25 DIAGNOSIS — Z8673 Personal history of transient ischemic attack (TIA), and cerebral infarction without residual deficits: Secondary | ICD-10-CM | POA: Diagnosis not present

## 2019-03-25 DIAGNOSIS — I639 Cerebral infarction, unspecified: Secondary | ICD-10-CM | POA: Diagnosis not present

## 2019-03-25 DIAGNOSIS — G939 Disorder of brain, unspecified: Secondary | ICD-10-CM | POA: Diagnosis not present

## 2019-03-25 DIAGNOSIS — D631 Anemia in chronic kidney disease: Secondary | ICD-10-CM | POA: Diagnosis not present

## 2019-03-25 DIAGNOSIS — E1122 Type 2 diabetes mellitus with diabetic chronic kidney disease: Secondary | ICD-10-CM | POA: Diagnosis not present

## 2019-03-25 DIAGNOSIS — N390 Urinary tract infection, site not specified: Secondary | ICD-10-CM | POA: Diagnosis not present

## 2019-03-25 DIAGNOSIS — N183 Chronic kidney disease, stage 3 (moderate): Secondary | ICD-10-CM | POA: Diagnosis not present

## 2019-03-25 DIAGNOSIS — I69320 Aphasia following cerebral infarction: Secondary | ICD-10-CM | POA: Diagnosis not present

## 2019-03-25 DIAGNOSIS — R410 Disorientation, unspecified: Secondary | ICD-10-CM | POA: Diagnosis not present

## 2019-03-25 NOTE — Telephone Encounter (Signed)
[facility] is calling and needs verbal order for  2x1, 1x2 for physical therapy

## 2019-03-25 NOTE — Telephone Encounter (Signed)
Verbal given for PT

## 2019-03-26 ENCOUNTER — Telehealth: Payer: Self-pay | Admitting: Family Medicine

## 2019-03-26 NOTE — Telephone Encounter (Signed)
Called and spoke to [address] from [facility] and gave verbal orders for skilled nursing as requested.

## 2019-03-26 NOTE — Telephone Encounter (Signed)
[facility] from [facility] called requesting to set up nurse visits.  1x 3weeks 1x other week for 3weeks  1x as needed  Sonja call back [phone]

## 2019-03-27 ENCOUNTER — Other Ambulatory Visit: Payer: Self-pay

## 2019-03-27 DIAGNOSIS — D631 Anemia in chronic kidney disease: Secondary | ICD-10-CM | POA: Diagnosis not present

## 2019-03-27 DIAGNOSIS — I69320 Aphasia following cerebral infarction: Secondary | ICD-10-CM | POA: Diagnosis not present

## 2019-03-27 DIAGNOSIS — N390 Urinary tract infection, site not specified: Secondary | ICD-10-CM | POA: Diagnosis not present

## 2019-03-27 DIAGNOSIS — H35033 Hypertensive retinopathy, bilateral: Secondary | ICD-10-CM | POA: Diagnosis not present

## 2019-03-27 DIAGNOSIS — N183 Chronic kidney disease, stage 3 (moderate): Secondary | ICD-10-CM | POA: Diagnosis not present

## 2019-03-27 DIAGNOSIS — H53462 Homonymous bilateral field defects, left side: Secondary | ICD-10-CM | POA: Diagnosis not present

## 2019-03-27 DIAGNOSIS — I10 Essential (primary) hypertension: Secondary | ICD-10-CM | POA: Diagnosis not present

## 2019-03-27 DIAGNOSIS — E113293 Type 2 diabetes mellitus with mild nonproliferative diabetic retinopathy without macular edema, bilateral: Secondary | ICD-10-CM | POA: Diagnosis not present

## 2019-03-27 DIAGNOSIS — H40013 Open angle with borderline findings, low risk, bilateral: Secondary | ICD-10-CM | POA: Diagnosis not present

## 2019-03-27 DIAGNOSIS — E1122 Type 2 diabetes mellitus with diabetic chronic kidney disease: Secondary | ICD-10-CM | POA: Diagnosis not present

## 2019-03-28 ENCOUNTER — Telehealth: Payer: Self-pay | Admitting: Family Medicine

## 2019-03-28 ENCOUNTER — Ambulatory Visit (INDEPENDENT_AMBULATORY_CARE_PROVIDER_SITE_OTHER): Payer: Medicare Other | Admitting: Family Medicine

## 2019-03-28 ENCOUNTER — Other Ambulatory Visit: Payer: Self-pay

## 2019-03-28 ENCOUNTER — Encounter: Payer: Self-pay | Admitting: Family Medicine

## 2019-03-28 DIAGNOSIS — I6310 Cerebral infarction due to embolism of unspecified precerebral artery: Secondary | ICD-10-CM

## 2019-03-28 DIAGNOSIS — E785 Hyperlipidemia, unspecified: Secondary | ICD-10-CM | POA: Diagnosis not present

## 2019-03-28 DIAGNOSIS — E1122 Type 2 diabetes mellitus with diabetic chronic kidney disease: Secondary | ICD-10-CM

## 2019-03-28 DIAGNOSIS — N183 Chronic kidney disease, stage 3 unspecified: Secondary | ICD-10-CM
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
## 2019-03-28 NOTE — Patient Instructions (Signed)
Nice to see you. Please continue to see the nephrologist.  Please call Dr. Assunta Gambles office to confirm whether or not you have an appointment on October 5.   Please complete the evaluation through neurology. Please do not drive until neurology advises you it is okay to do so.

## 2019-03-28 NOTE — Assessment & Plan Note (Signed)
Patient has an appointment with nephrology coming up.  I encouraged her to contact them to confirm this.  She will have lab work through them.

## 2019-03-28 NOTE — Assessment & Plan Note (Signed)
She will continue on glimepiride.  She will continue to follow with endocrinology.

## 2019-03-28 NOTE — Assessment & Plan Note (Signed)
Patient will finish evaluation through neurology.  She will continue dual antiplatelet therapy with Plavix and aspirin as advised by neurology.  Continue with risk factor management with glimepiride and Crestor as well as amlodipine.  Blood pressure is adequately controlled today.  LDL is controlled as well.  Reinforced that she should not drive until neurology clears her to do so.

## 2019-03-28 NOTE — Assessment & Plan Note (Addendum)
LDL adequately controlled.  Continue Crestor.

## 2019-03-28 NOTE — Telephone Encounter (Signed)
Please contact the patient and confirm whether or not she is taking metformin.  Given her recent kidney function she should no longer be taking this.  Thanks.

## 2019-04-02 ENCOUNTER — Other Ambulatory Visit: Payer: Self-pay | Admitting: Family Medicine

## 2019-04-02 DIAGNOSIS — Z8673 Personal history of transient ischemic attack (TIA), and cerebral infarction without residual deficits: Secondary | ICD-10-CM | POA: Diagnosis not present

## 2019-04-02 DIAGNOSIS — E785 Hyperlipidemia, unspecified: Secondary | ICD-10-CM | POA: Diagnosis not present

## 2019-04-02 DIAGNOSIS — I10 Essential (primary) hypertension: Secondary | ICD-10-CM | POA: Diagnosis not present

## 2019-04-02 DIAGNOSIS — I480 Paroxysmal atrial fibrillation: Secondary | ICD-10-CM | POA: Diagnosis not present

## 2019-04-02 DIAGNOSIS — E1165 Type 2 diabetes mellitus with hyperglycemia: Secondary | ICD-10-CM | POA: Diagnosis not present

## 2019-04-02 NOTE — Telephone Encounter (Signed)
Noted  

## 2019-04-02 NOTE — Telephone Encounter (Signed)
Patient states she is not taking metformin.  Keimari ,cma

## 2019-04-03 DIAGNOSIS — I69320 Aphasia following cerebral infarction: Secondary | ICD-10-CM | POA: Diagnosis not present

## 2019-04-03 DIAGNOSIS — N183 Chronic kidney disease, stage 3 (moderate): Secondary | ICD-10-CM | POA: Diagnosis not present

## 2019-04-03 DIAGNOSIS — I10 Essential (primary) hypertension: Secondary | ICD-10-CM | POA: Diagnosis not present

## 2019-04-03 DIAGNOSIS — E1122 Type 2 diabetes mellitus with diabetic chronic kidney disease: Secondary | ICD-10-CM | POA: Diagnosis not present

## 2019-04-03 DIAGNOSIS — D631 Anemia in chronic kidney disease: Secondary | ICD-10-CM | POA: Diagnosis not present

## 2019-04-03 DIAGNOSIS — N390 Urinary tract infection, site not specified: Secondary | ICD-10-CM | POA: Diagnosis not present

## 2019-04-07 ENCOUNTER — Other Ambulatory Visit: Payer: Self-pay | Admitting: Family Medicine

## 2019-04-07 DIAGNOSIS — I129 Hypertensive chronic kidney disease with stage 1 through stage 4 chronic kidney disease, or unspecified chronic kidney disease: Secondary | ICD-10-CM | POA: Diagnosis not present

## 2019-04-07 DIAGNOSIS — N2581 Secondary hyperparathyroidism of renal origin: Secondary | ICD-10-CM | POA: Insufficient documentation

## 2019-04-07 DIAGNOSIS — R808 Other proteinuria: Secondary | ICD-10-CM | POA: Diagnosis not present

## 2019-04-07 DIAGNOSIS — R809 Proteinuria, unspecified: Secondary | ICD-10-CM | POA: Insufficient documentation

## 2019-04-07 DIAGNOSIS — E1122 Type 2 diabetes mellitus with diabetic chronic kidney disease: Secondary | ICD-10-CM | POA: Diagnosis not present

## 2019-04-09 DIAGNOSIS — D631 Anemia in chronic kidney disease: Secondary | ICD-10-CM | POA: Diagnosis not present

## 2019-04-09 DIAGNOSIS — I10 Essential (primary) hypertension: Secondary | ICD-10-CM | POA: Diagnosis not present

## 2019-04-09 DIAGNOSIS — I69320 Aphasia following cerebral infarction: Secondary | ICD-10-CM | POA: Diagnosis not present

## 2019-04-09 DIAGNOSIS — N183 Chronic kidney disease, stage 3 (moderate): Secondary | ICD-10-CM | POA: Diagnosis not present

## 2019-04-09 DIAGNOSIS — N390 Urinary tract infection, site not specified: Secondary | ICD-10-CM | POA: Diagnosis not present

## 2019-04-09 DIAGNOSIS — E1122 Type 2 diabetes mellitus with diabetic chronic kidney disease: Secondary | ICD-10-CM | POA: Diagnosis not present

## 2019-04-10 DIAGNOSIS — I69320 Aphasia following cerebral infarction: Secondary | ICD-10-CM | POA: Diagnosis not present

## 2019-04-10 DIAGNOSIS — N183 Chronic kidney disease, stage 3 (moderate): Secondary | ICD-10-CM | POA: Diagnosis not present

## 2019-04-10 DIAGNOSIS — E1122 Type 2 diabetes mellitus with diabetic chronic kidney disease: Secondary | ICD-10-CM | POA: Diagnosis not present

## 2019-04-10 DIAGNOSIS — I10 Essential (primary) hypertension: Secondary | ICD-10-CM | POA: Diagnosis not present

## 2019-04-10 DIAGNOSIS — D631 Anemia in chronic kidney disease: Secondary | ICD-10-CM | POA: Diagnosis not present

## 2019-04-10 DIAGNOSIS — N390 Urinary tract infection, site not specified: Secondary | ICD-10-CM | POA: Diagnosis not present

## 2019-04-12 ENCOUNTER — Other Ambulatory Visit: Payer: Self-pay

## 2019-04-12 ENCOUNTER — Encounter: Payer: Self-pay | Admitting: Emergency Medicine

## 2019-04-12 ENCOUNTER — Inpatient Hospital Stay
Admission: EM | Admit: 2019-04-12 | Discharge: 2019-04-15 | DRG: 066 | Disposition: A | Discharge status: Home Health | Payer: Medicare Other | Attending: Internal Medicine | Admitting: Internal Medicine

## 2019-04-12 ENCOUNTER — Inpatient Hospital Stay: Payer: Medicare Other

## 2019-04-12 ENCOUNTER — Emergency Department: Payer: Medicare Other

## 2019-04-12 DIAGNOSIS — Z888 Allergy status to other drugs, medicaments and biological substances status: Secondary | ICD-10-CM

## 2019-04-12 DIAGNOSIS — Z20828 Contact with and (suspected) exposure to other viral communicable diseases: Secondary | ICD-10-CM | POA: Diagnosis present

## 2019-04-12 DIAGNOSIS — E1122 Type 2 diabetes mellitus with diabetic chronic kidney disease: Secondary | ICD-10-CM | POA: Diagnosis present

## 2019-04-12 DIAGNOSIS — Z7982 Long term (current) use of aspirin: Secondary | ICD-10-CM

## 2019-04-12 DIAGNOSIS — Z833 Family history of diabetes mellitus: Secondary | ICD-10-CM | POA: Diagnosis not present

## 2019-04-12 DIAGNOSIS — Z6830 Body mass index (BMI) 30.0-30.9, adult: Secondary | ICD-10-CM | POA: Diagnosis not present

## 2019-04-12 DIAGNOSIS — N183 Chronic kidney disease, stage 3 unspecified: Secondary | ICD-10-CM | POA: Diagnosis present

## 2019-04-12 DIAGNOSIS — Z79899 Other long term (current) drug therapy: Secondary | ICD-10-CM

## 2019-04-12 DIAGNOSIS — Z841 Family history of disorders of kidney and ureter: Secondary | ICD-10-CM

## 2019-04-12 DIAGNOSIS — E669 Obesity, unspecified: Secondary | ICD-10-CM | POA: Diagnosis present

## 2019-04-12 DIAGNOSIS — Z823 Family history of stroke: Secondary | ICD-10-CM | POA: Diagnosis not present

## 2019-04-12 DIAGNOSIS — I6523 Occlusion and stenosis of bilateral carotid arteries: Secondary | ICD-10-CM | POA: Diagnosis present

## 2019-04-12 DIAGNOSIS — R4701 Aphasia: Secondary | ICD-10-CM | POA: Diagnosis not present

## 2019-04-12 DIAGNOSIS — I129 Hypertensive chronic kidney disease with stage 1 through stage 4 chronic kidney disease, or unspecified chronic kidney disease: Secondary | ICD-10-CM | POA: Diagnosis present

## 2019-04-12 DIAGNOSIS — Z8673 Personal history of transient ischemic attack (TIA), and cerebral infarction without residual deficits: Secondary | ICD-10-CM | POA: Diagnosis not present

## 2019-04-12 DIAGNOSIS — Z7902 Long term (current) use of antithrombotics/antiplatelets: Secondary | ICD-10-CM

## 2019-04-12 DIAGNOSIS — E785 Hyperlipidemia, unspecified: Secondary | ICD-10-CM | POA: Diagnosis present

## 2019-04-12 DIAGNOSIS — R4789 Other speech disturbances: Secondary | ICD-10-CM | POA: Diagnosis not present

## 2019-04-12 DIAGNOSIS — I10 Essential (primary) hypertension: Secondary | ICD-10-CM | POA: Diagnosis not present

## 2019-04-12 DIAGNOSIS — I6310 Cerebral infarction due to embolism of unspecified precerebral artery: Secondary | ICD-10-CM | POA: Diagnosis not present

## 2019-04-12 DIAGNOSIS — I639 Cerebral infarction, unspecified: Secondary | ICD-10-CM

## 2019-04-12 DIAGNOSIS — D631 Anemia in chronic kidney disease: Secondary | ICD-10-CM | POA: Diagnosis present

## 2019-04-12 HISTORY — DX: Cerebral infarction, unspecified: I63.9

## 2019-04-12 LAB — CBC WITH DIFFERENTIAL/PLATELET
Abs Immature Granulocytes: 0.02 10*3/uL (ref 0.00–0.07)
Basophils Absolute: 0 10*3/uL (ref 0.0–0.1)
Basophils Relative: 1 %
Eosinophils Absolute: 0.2 10*3/uL (ref 0.0–0.5)
Eosinophils Relative: 3 %
HCT: 29 % — ABNORMAL LOW (ref 36.0–46.0)
Hemoglobin: 9.8 g/dL — ABNORMAL LOW (ref 12.0–15.0)
Immature Granulocytes: 0 %
Lymphocytes Relative: 25 %
Lymphs Abs: 1.2 10*3/uL (ref 0.7–4.0)
MCH: 28.4 pg (ref 26.0–34.0)
MCHC: 33.8 g/dL (ref 30.0–36.0)
MCV: 84.1 fL (ref 80.0–100.0)
Monocytes Absolute: 0.4 10*3/uL (ref 0.1–1.0)
Monocytes Relative: 8 %
Neutro Abs: 3 10*3/uL (ref 1.7–7.7)
Neutrophils Relative %: 63 %
Platelets: 163 10*3/uL (ref 150–400)
RBC: 3.45 MIL/uL — ABNORMAL LOW (ref 3.87–5.11)
RDW: 12.2 % (ref 11.5–15.5)
WBC: 4.8 10*3/uL (ref 4.0–10.5)
nRBC: 0 % (ref 0.0–0.2)

## 2019-04-12 LAB — COMPREHENSIVE METABOLIC PANEL
ALT: 19 U/L (ref 0–44)
AST: 25 U/L (ref 15–41)
Albumin: 4 g/dL (ref 3.5–5.0)
Alkaline Phosphatase: 56 U/L (ref 38–126)
Anion gap: 10 (ref 5–15)
BUN: 25 mg/dL — ABNORMAL HIGH (ref 8–23)
CO2: 24 mmol/L (ref 22–32)
Calcium: 8.9 mg/dL (ref 8.9–10.3)
Chloride: 101 mmol/L (ref 98–111)
Creatinine, Ser: 2.19 mg/dL — ABNORMAL HIGH (ref 0.44–1.00)
GFR calc Af Amer: 23 mL/min — ABNORMAL LOW (ref >60)
GFR calc non Af Amer: 20 mL/min — ABNORMAL LOW (ref >60)
Glucose, Bld: 455 mg/dL — ABNORMAL HIGH (ref 70–99)
Potassium: 4.3 mmol/L (ref 3.5–5.1)
Sodium: 135 mmol/L (ref 135–145)
Total Bilirubin: 0.5 mg/dL (ref 0.3–1.2)
Total Protein: 8 g/dL (ref 6.5–8.1)

## 2019-04-12 LAB — GLUCOSE, CAPILLARY
Glucose-Capillary: 445 mg/dL — ABNORMAL HIGH (ref 70–99)
Glucose-Capillary: 196 mg/dL — ABNORMAL HIGH (ref 70–99)

## 2019-04-12 LAB — IRON AND TIBC
Iron: 57 ug/dL (ref 28–170)
Saturation Ratios: 21 % (ref 10.4–31.8)
TIBC: 266 ug/dL (ref 250–450)
UIBC: 209 ug/dL

## 2019-04-12 LAB — TROPONIN I (HIGH SENSITIVITY)
Troponin I (High Sensitivity): 6 ng/L (ref <18)
Troponin I (High Sensitivity): 6 ng/L (ref <18)

## 2019-04-12 LAB — FERRITIN: Ferritin: 105 ng/mL (ref 11–307)

## 2019-04-12 MED ORDER — SODIUM CHLORIDE 0.9 % IV BOLUS
1000.0000 mL | Freq: Once | INTRAVENOUS | Status: AC
  Administered 2019-04-12: 1000 mL via INTRAVENOUS

## 2019-04-12 MED ORDER — ENOXAPARIN SODIUM 30 MG/0.3ML ~~LOC~~ SOLN
30.0000 mg | SUBCUTANEOUS | Status: DC
  Administered 2019-04-12 – 2019-04-14 (×3): 30 mg via SUBCUTANEOUS
  Filled 2019-04-12 (×3): qty 0.3

## 2019-04-12 MED ORDER — AMLODIPINE BESYLATE 10 MG PO TABS: 10.0000 mg | ORAL_TABLET | Freq: Every day | ORAL | Status: DC

## 2019-04-12 MED ORDER — ROSUVASTATIN CALCIUM 20 MG PO TABS
40.0000 mg | ORAL_TABLET | Freq: Every day | ORAL | Status: DC
  Administered 2019-04-13 – 2019-04-15 (×3): 40 mg via ORAL
  Filled 2019-04-12 (×2): qty 2

## 2019-04-12 MED ORDER — INSULIN ASPART 100 UNIT/ML ~~LOC~~ SOLN
0.0000 [IU] | Freq: Every day | SUBCUTANEOUS | Status: DC
  Administered 2019-04-14: 5 [IU] via SUBCUTANEOUS
  Filled 2019-04-12: qty 1

## 2019-04-12 MED ORDER — ENOXAPARIN SODIUM 40 MG/0.4ML ~~LOC~~ SOLN: 30.0000 mg | SUBCUTANEOUS | Status: DC

## 2019-04-12 MED ORDER — STROKE: EARLY STAGES OF RECOVERY BOOK: Freq: Once | Status: DC

## 2019-04-12 MED ORDER — GLIMEPIRIDE 2 MG PO TABS
2.0000 mg | ORAL_TABLET | Freq: Every day | ORAL | Status: DC
  Administered 2019-04-13 – 2019-04-15 (×2): 2 mg via ORAL
  Filled 2019-04-12 (×3): qty 1

## 2019-04-12 MED ORDER — INSULIN ASPART 100 UNIT/ML ~~LOC~~ SOLN
0.0000 [IU] | Freq: Three times a day (TID) | SUBCUTANEOUS | Status: DC
  Administered 2019-04-13 (×2): 3 [IU] via SUBCUTANEOUS
  Administered 2019-04-13: 18:00:00 1 [IU] via SUBCUTANEOUS
  Administered 2019-04-14: 17:00:00 2 [IU] via SUBCUTANEOUS
  Administered 2019-04-15: 10:00:00 3 [IU] via SUBCUTANEOUS
  Administered 2019-04-15: 13:00:00 9 [IU] via SUBCUTANEOUS
  Filled 2019-04-12 (×6): qty 1

## 2019-04-12 MED ORDER — ASPIRIN 81 MG PO CHEW
324.0000 mg | CHEWABLE_TABLET | Freq: Every day | ORAL | Status: DC
  Administered 2019-04-12 – 2019-04-15 (×4): 324 mg via ORAL
  Filled 2019-04-12 (×3): qty 4

## 2019-04-12 MED ORDER — CLOPIDOGREL BISULFATE 75 MG PO TABS
75.0000 mg | ORAL_TABLET | Freq: Every day | ORAL | Status: DC
  Administered 2019-04-12 – 2019-04-15 (×4): 75 mg via ORAL
  Filled 2019-04-12 (×3): qty 1

## 2019-04-12 NOTE — ED Triage Notes (Addendum)
Pt started with slurred speech yesterday. Pt reports 2100 yesterday but sister reports started before 6 pm. Unsure exact onset yesterday. No weakness but some dysarthria and expressive aphasia noted. No pain. Hx of stroke. No drift seen.

## 2019-04-12 NOTE — ED Notes (Signed)
Pt presents to ED via POV with a family member reports, last night pt began having slurred speech. Pt reports last time someone talked to her and her speech was normal was Friday. Pt's family member reports yesterday at approx 46, was talking to her sister and she had difficulty getting the word "promise" out. Pt states hx of stroke. Pt with noted dysarthria upon arrival to ED. Pt and family are unable to state when the patient LKW are.

## 2019-04-12 NOTE — ED Notes (Signed)
Pt's daughter left to sit in parking lot with granddaughter. Pt's daughter aware that she is not allowed to come back. Pt daughter would like to be updated via phone. [phone]

## 2019-04-12 NOTE — ED Provider Notes (Signed)
[facility] Emergency Department Provider Note ____________________________________________   None    (approximate)  I have reviewed the triage vital signs and the nursing notes.   HISTORY  Chief Complaint Aphasia  HPI Brianna Dodson is a 85 y.o. female who presents to the emergency department for treatment and evaluation of aphasia that started sometime yesterday morning.  Unsure of the exact time, but sister spoke with her on the phone and noticed that she was slurring her words.  This was sometime yesterday morning.  Sister states that she then went to her house that the patient would not let her in.  The patient states this was because she was in bed and did not want to get up. Other than the aphasia, she denies other weakness or new concerns. She does have a history of recent CVA.    Past Medical History:  Diagnosis Date   CKD (chronic kidney disease), stage III    Diabetes mellitus without complication ([place])    History of blood transfusion    Hyperlipidemia    Hypertension    Stroke [facility])     Patient Active Problem List   Diagnosis Date Noted   Aphasia 04/12/2019   Stroke (cerebrum) ([place]) 03/15/2019   Positional numbness and tingling in both hands, ulnar aspect 09/19/2018   Contusion of left shoulder 04/11/2018   Itching 10/23/2017   N[place] 04/17/2017   CKD stage 3 due to type 2 diabetes mellitus ([place]) 02/02/2015   Vitamin D deficiency 02/02/2015   Obesity (BMI 30-39.9) 08/03/2014   DM type 2 (diabetes mellitus, type 2) ([place]) 01/29/2014   Essential hypertension 01/13/2014   HLD (hyperlipidemia) 01/13/2014    Past Surgical History:  Procedure Laterality Date   ABDOMINAL HYSTERECTOMY  1985    Prior to Admission medications   Medication Sig Start Date End Date Taking? Authorizing Provider  ACCU-CHEK GUIDE test strip USE TO CHECK BLOOD SUGARS TWICE DAILY. E11.9 04/04/18  Yes [name], MD  amLODipine (NORVASC)  10 MG tablet TAKE 1 TABLET BY MOUTH EVERY DAY Patient taking differently: Take 10 mg by mouth daily.  04/07/19  Yes [name], MD  aspirin 81 MG tablet Take 81 mg by mouth daily.   Yes [provider]  blood glucose meter kit and supplies KIT Dispense based on patient and insurance preference. Check CBGs two times daily. E11.9. 01/25/18  Yes [name], MD  blood glucose meter kit and supplies Dispense based on patient and insurance preference. Use up to four times daily as directed. E11.9 09/14/16  Yes Cook, Jayce G, DO  clopidogrel (PLAVIX) 75 MG tablet Take 1 tablet (75 mg total) by mouth daily. 03/17/19  Yes Vaughan Basta, MD  glimepiride (AMARYL) 2 MG tablet Take 2 mg by mouth daily with breakfast. 01/28/19  Yes [provider]  Lancets (ONETOUCH ULTRASOFT) lancets Use as instructed 09/14/16  Yes Cook, Jayce G, DO  rosuvastatin (CRESTOR) 40 MG tablet TAKE 1 TABLET BY MOUTH EVERY DAY Patient taking differently: Take 40 mg by mouth daily.  08/28/18  Yes [name], MD  Vitamin D, Ergocalciferol, (DRISDOL) 50000 units CAPS capsule Take 50,000 Units by mouth every 30 (thirty) days. 10/05/17  Yes [provider]    Allergies Nsaids  Family History  Problem Relation Age of Onset   Stroke Mother    Diabetes Mother    Heart disease Father    Kidney disease Sister    Diabetes Sister    Kidney  disease Brother    Heart disease Sister    Diabetes Sister    Diabetes Sister    Diabetes Sister    Kidney disease Sister    Heart disease Sister    Kidney disease Brother        kidney transplant   Early death Brother 62       Truck Accident - died   Heart disease Brother     Social History Social History   Tobacco Use   Smoking status: Never Smoker   Smokeless tobacco: Never Used  Substance Use Topics   Alcohol use: No   Drug use: No    Review of Systems  Constitutional: No fever/chills Eyes: No visual  changes. ENT: No sore throat. Cardiovascular: Denies chest pain. Respiratory: Denies shortness of breath. Gastrointestinal: No abdominal pain.  No nausea, no vomiting.  No diarrhea.  No constipation. Genitourinary: Negative for dysuria. Musculoskeletal: Negative for back pain. Skin: Negative for rash. Neurological: Negative for headaches, focal weakness or numbness. Positive for aphasia.  ____________________________________________   PHYSICAL EXAM:  VITAL SIGNS: ED Triage Vitals  Enc Vitals Group     BP 04/12/19 1545 (!) 154/71     Pulse Rate 04/12/19 1534 81     Resp 04/12/19 1534 15     Temp 04/12/19 1534 98.3 F (36.8 C)     Temp Source 04/12/19 1534 Oral     SpO2 04/12/19 1534 97 %     Weight 04/12/19 1542 190 lb (86.2 kg)     Height 04/12/19 1542 5' 5"  (1.651 m)     Head Circumference --      Peak Flow --      Pain Score 04/12/19 1542 0     Pain Loc --      Pain Edu? --      Excl. in [place]? --     Constitutional: Alert and oriented. Well appearing and in no acute distress. Eyes: Conjunctivae are normal. PERRL. EOMI. Head: Atraumatic. Nose: No congestion/rhinnorhea. Mouth/Throat: Mucous membranes are moist.  Oropharynx non-erythematous. Neck: No stridor.   Hematological/Lymphatic/Immunilogical: No cervical lymphadenopathy. Cardiovascular: Normal rate, regular rhythm. Grossly normal heart sounds.  Good peripheral circulation. Respiratory: Normal respiratory effort.  No retractions. Lungs CTAB. Gastrointestinal: Soft and nontender. No distention. No abdominal bruits. No CVA tenderness. Genitourinary:  Musculoskeletal: No lower extremity tenderness nor edema.  No joint effusions. Neurologic:  Normal speech and language. No gross focal neurologic deficits are appreciated. No gait instability. Skin:  Skin is warm, dry and intact. No rash noted. Psychiatric: Mood and affect are normal. Speech and behavior are normal.  ____________________________________________    LABS (all labs ordered are listed, but only abnormal results are displayed)  Labs Reviewed  GLUCOSE, CAPILLARY - Abnormal; Notable for the following components:      Result Value   Glucose-Capillary 445 (*)    All other components within normal limits  COMPREHENSIVE METABOLIC PANEL - Abnormal; Notable for the following components:   Glucose, Bld 455 (*)    BUN 25 (*)    Creatinine, Ser 2.19 (*)    GFR calc non Af Amer 20 (*)    GFR calc Af Amer 23 (*)    All other components within normal limits  CBC WITH DIFFERENTIAL/PLATELET - Abnormal; Notable for the following components:   RBC 3.45 (*)    Hemoglobin 9.8 (*)    HCT 29.0 (*)    All other components within normal limits  FERRITIN  IRON AND TIBC  URINALYSIS,  COMPLETE (UACMP) WITH MICROSCOPIC  LIPID PANEL  CBC  BASIC METABOLIC PANEL  HEMOGLOBIN A1C  CBG MONITORING, ED  TROPONIN I (HIGH SENSITIVITY)  TROPONIN I (HIGH SENSITIVITY)   ____________________________________________  EKG  ED ECG REPORT I, Kyannah Climer, FNP-BC personally viewed and interpreted this ECG.   Date: 04/12/2019  EKG Time: 1559  Rate: 76  Rhythm: normal EKG, normal sinus rhythm  Axis: left axis  Intervals:none  ST&T Change: no ST elevation  ____________________________________________  RADIOLOGY  ED MD interpretation:    CT head negative for acute findings.  Official radiology report(s): Ct Head Wo Contrast  Result Date: 04/12/2019 CLINICAL DATA:  Speech difficulty since yesterday evening. EXAM: CT HEAD WITHOUT CONTRAST TECHNIQUE: Contiguous axial images were obtained from the base of the skull through the vertex without intravenous contrast. COMPARISON:  CT head and MR brain dated March 15, 2019. FINDINGS: Brain: No evidence of acute infarction, hemorrhage, hydrocephalus, extra-axial collection or mass lesion/mass effect. Stable mild atrophy and moderate chronic microvascular ischemic changes. Vascular: Calcified atherosclerosis at  the skullbase. No hyperdense vessel. Skull: Normal. Negative for fracture or focal lesion. Sinuses/Orbits: No acute finding. Other: None. IMPRESSION: 1.  No acute intracranial abnormality. 2. Stable mild atrophy and moderate chronic microvascular ischemic changes. Electronically Signed   By: Titus Dubin M.D.   On: 04/12/2019 16:37    ____________________________________________   PROCEDURES  Procedure(s) performed (including Critical Care):  Procedures  ____________________________________________   INITIAL IMPRESSION / ASSESSMENT AND PLAN     85 year old female presenting to the emergency department for treatment and evaluation of aphasia.  NIH score is less than 6.  Symptoms started greater than 24 hours ago.  Patient is very alert oriented and able to converse.  She realizes that her speech is slurred.  She denies pain.  She has not had a headache recently she denies any other symptoms of concern.  Plan will be to do labs and CT.  DIFFERENTIAL DIAGNOSIS  CVA, TIA, residual aphasia  ED COURSE  CT is reassuring as are her lab studies with the exception of her glucose.  She is hyperglycemic and will be given a liter of fluids.  Plan will be to admit her for further stroke work-up.  Patient was agreeable to this plan. ____________________________________________   FINAL CLINICAL IMPRESSION(S) / ED DIAGNOSES  Final diagnoses:  Aphasia     ED Discharge Orders    None       Note:  This document was prepared using Dragon voice recognition software and may include unintentional dictation errors.   Victorino Dike, FNP 04/12/19 2027    Blake Divine, MD 04/12/19 2034

## 2019-04-12 NOTE — Progress Notes (Signed)
[name], female   DOB: [birth year], 85 y.o.   MRN: [id number]  Spoke with patient at the bedside and sister [name] on the phone.  [phone]  Patient coming in with difficulty speaking since yesterday.  She stated that it started around 3 PM yesterday.  The patient's sister stated it has been going on since 1:59 PM yesterday.  There is currently after 5 PM when I saw her which would be over 24 hours.  She did not take her aspirin or Plavix yet today so I will give that.  Patient does not have any weakness one side versus the other.  CODE STATUS discussed patient wishes to be a full code.  Plan.  Slurred speech and aphasia.  Neurology consultation.  Restart aspirin and Plavix.  We will get an MRI of the brain and MRA of the carotids.  Monitor on telemetry for arrhythmias.  Patient had a recent echocardiogram.  Await neurology consultation on whether the patient needs a TEE.  Time spent on ACP discussion 17 minutes Dr Loletha Grayer

## 2019-04-12 NOTE — ED Notes (Addendum)
First Nurse Note: Pt to ED with family who states that they received a call yesterday that pt was slurring her words. Family checked on pt and pt said she did not have her teeth in. Pt still slurring words today.

## 2019-04-12 NOTE — ED Notes (Signed)
Pt on phone with granddaughter

## 2019-04-12 NOTE — Progress Notes (Signed)
NP Ouma notified of pulse rate in the 30's-40's while sleeping. Will continue to monitor.

## 2019-04-12 NOTE — ED Notes (Signed)
Patient transported to MRI 

## 2019-04-12 NOTE — ED Notes (Signed)
ED TO INPATIENT HANDOFF REPORT  ED Nurse Name and Phone #:  Quillian Quince Raymond Name/Age/Gender Brianna Dodson 85 y.o. female Room/Bed: ED15A/ED15A  Code Status   Code Status: Full Code  Home/SNF/Other Home Patient oriented to: self, place, time and situation Is this baseline? Yes   Triage Complete: Triage complete  Chief Complaint Slurred Speech no cell.  DD  Triage Note Pt started with slurred speech yesterday. Pt reports 2100 yesterday but sister reports started before 6 pm. Unsure exact onset yesterday. No weakness but some dysarthria and expressive aphasia noted. No pain. Hx of stroke. No drift seen.    Allergies Allergies  Allergen Reactions  . Nsaids     CKD stage III - Avoid all nephrotoxic drugs    Level of Care/Admitting Diagnosis ED Disposition    ED Disposition Condition [place] [facility] Area: [place] [100120]  Level of Care: Med-Surg [16]  Covid Evaluation: Asymptomatic Screening Protocol (No Symptoms)  Diagnosis: Aphasia [place].3.ICD-9-CM]  Admitting Physician: Loletha Grayer [[id number]]  Attending Physician: Loletha Grayer [[id number]]  Estimated length of stay: past midnight tomorrow  Certification:: I certify this patient will need inpatient services for at least 2 midnights  PT Class (Do Not Modify): Inpatient [101]  PT Acc Code (Do Not Modify): Private [1]       B Medical/Surgery History Past Medical History:  Diagnosis Date  . CKD (chronic kidney disease), stage III   . Diabetes mellitus without complication ([place])   . History of blood transfusion   . Hyperlipidemia   . Hypertension   . Stroke [facility])    Past Surgical History:  Procedure Laterality Date  . ABDOMINAL HYSTERECTOMY  1985     A IV Location/Drains/Wounds Patient Lines/Drains/Airways Status   Active Line/Drains/Airways    Name:   Placement date:   Placement time:   Site:   Days:   Peripheral IV 04/12/19 Left Antecubital   04/12/19    1556     Antecubital   less than 1          Intake/Output Last 24 hours No intake or output data in the 24 hours ending 04/12/19 1908  Labs/Imaging Results for orders placed or performed during the hospital encounter of 04/12/19 (from the past 48 hour(s))  Glucose, capillary     Status: Abnormal   Collection Time: 04/12/19  3:51 PM  Result Value Ref Range   Glucose-Capillary 445 (H) 70 - 99 mg/dL  Comprehensive metabolic panel     Status: Abnormal   Collection Time: 04/12/19  3:57 PM  Result Value Ref Range   Sodium 135 135 - 145 mmol/L   Potassium 4.3 3.5 - 5.1 mmol/L   Chloride 101 98 - 111 mmol/L   CO2 24 22 - 32 mmol/L   Glucose, Bld 455 (H) 70 - 99 mg/dL   BUN 25 (H) 8 - 23 mg/dL   Creatinine, Ser 2.19 (H) 0.44 - 1.00 mg/dL   Calcium 8.9 8.9 - 10.3 mg/dL   Total Protein 8.0 6.5 - 8.1 g/dL   Albumin 4.0 3.5 - 5.0 g/dL   AST 25 15 - 41 U/L   ALT 19 0 - 44 U/L   Alkaline Phosphatase 56 38 - 126 U/L   Total Bilirubin 0.5 0.3 - 1.2 mg/dL   GFR calc non Af Amer 20 (L) >60 mL/min   GFR calc Af Amer 23 (L) >60 mL/min   Anion gap 10 5 - 15  Comment: Performed at [facility], [address] [place], [place] [address]  CBC with Differential     Status: Abnormal   Collection Time: 04/12/19  3:57 PM  Result Value Ref Range   WBC 4.8 4.0 - 10.5 K/uL   RBC 3.45 (L) 3.87 - 5.11 MIL/uL   Hemoglobin 9.8 (L) 12.0 - 15.0 g/dL   HCT 29.0 (L) 36.0 - 46.0 %   MCV 84.1 80.0 - 100.0 fL   MCH 28.4 26.0 - 34.0 pg   MCHC 33.8 30.0 - 36.0 g/dL   RDW 12.2 11.5 - 15.5 %   Platelets 163 150 - 400 K/uL   nRBC 0.0 0.0 - 0.2 %   Neutrophils Relative % 63 %   Neutro Abs 3.0 1.7 - 7.7 K/uL   Lymphocytes Relative 25 %   Lymphs Abs 1.2 0.7 - 4.0 K/uL   Monocytes Relative 8 %   Monocytes Absolute 0.4 0.1 - 1.0 K/uL   Eosinophils Relative 3 %   Eosinophils Absolute 0.2 0.0 - 0.5 K/uL   Basophils Relative 1 %   Basophils Absolute 0.0 0.0 - 0.1 K/uL   Immature Granulocytes 0 %   Abs  Immature Granulocytes 0.02 0.00 - 0.07 K/uL    Comment: Performed at [facility], [place], [place] [address]  Troponin I (High Sensitivity)     Status: None   Collection Time: 04/12/19  3:57 PM  Result Value Ref Range   Troponin I (High Sensitivity) 6 <18 ng/L    Comment: (NOTE) Elevated high sensitivity troponin I (hsTnI) values and significant  changes across serial measurements may suggest ACS but many other  chronic and acute conditions are known to elevate hsTnI results.  Refer to the "Links" section for chest pain algorithms and additional  guidance. Performed at [facility], [address] [place], [place] [address]   Ferritin     Status: None   Collection Time: 04/12/19  3:57 PM  Result Value Ref Range   Ferritin 105 11 - 307 ng/mL    Comment: Performed at [facility], [address] [place], [place] [address]  Iron and TIBC     Status: None   Collection Time: 04/12/19  3:57 PM  Result Value Ref Range   Iron 57 28 - 170 ug/dL   TIBC 266 250 - 450 ug/dL   Saturation Ratios 21 10.4 - 31.8 %   UIBC 209 ug/dL    Comment: Performed at [facility], [address] [place],  [address]   Ct Head Wo Contrast  Result Date: 04/12/2019 CLINICAL DATA:  Speech difficulty since yesterday evening. EXAM: CT HEAD WITHOUT CONTRAST TECHNIQUE: Contiguous axial images were obtained from the base of the skull through the vertex without intravenous contrast. COMPARISON:  CT head and MR brain dated March 15, 2019. FINDINGS: Brain: No evidence of acute infarction, hemorrhage, hydrocephalus, extra-axial collection or mass lesion/mass effect. Stable mild atrophy and moderate chronic microvascular ischemic changes. Vascular: Calcified atherosclerosis at the skullbase. No hyperdense vessel. Skull: Normal. Negative for fracture or focal lesion. Sinuses/Orbits: No acute finding. Other: None. IMPRESSION: 1.  No acute intracranial  abnormality. 2. Stable mild atrophy and moderate chronic microvascular ischemic changes. Electronically Signed   By: Titus Dubin M.D.   On: 04/12/2019 16:37    Pending Labs Unresulted Labs (From admission, onward)    Start     Ordered   04/13/19 0500  Lipid panel  Tomorrow morning,  STAT    Comments: Fasting    04/12/19 1741   04/13/19 0500  CBC  Tomorrow morning,   STAT     04/12/19 1741   04/13/19 8657  Basic metabolic panel  Tomorrow morning,   STAT     04/12/19 1741   04/12/19 1557  Hemoglobin A1c  Once,   R     04/12/19 1557   04/12/19 1553  Urinalysis, Complete w Microscopic  ONCE - STAT,   STAT     04/12/19 1554          Vitals/Pain Today's Vitals   04/12/19 1700 04/12/19 1730 04/12/19 1800 04/12/19 1830  BP: (!) 164/74 (!) 172/89 (!) 171/91 (!) 173/78  Pulse: 68 69 79 71  Resp: (!) 23 16 17 15   Temp:      TempSrc:      SpO2: 97% 99% 99% 99%  Weight:      Height:      PainSc:        Isolation Precautions No active isolations  Medications Medications  aspirin chewable tablet 324 mg (324 mg Oral Given 04/12/19 1753)  clopidogrel (PLAVIX) tablet 75 mg (75 mg Oral Given 04/12/19 1755)   stroke: mapping our early stages of recovery book (has no administration in time range)  amLODipine (NORVASC) tablet 10 mg (has no administration in time range)  rosuvastatin (CRESTOR) tablet 40 mg (has no administration in time range)  glimepiride (AMARYL) tablet 2 mg (has no administration in time range)  insulin aspart (novoLOG) injection 0-9 Units (has no administration in time range)  insulin aspart (novoLOG) injection 0-5 Units (has no administration in time range)  enoxaparin (LOVENOX) injection 30 mg (has no administration in time range)  sodium chloride 0.9 % bolus 1,000 mL (0 mLs Intravenous Stopped 04/12/19 1827)    Mobility walks with device Low fall risk   Focused Assessments Neuro Assessment Handoff:  Swallow screen pass? N/A   NIH Stroke Scale ( +  Modified Stroke Scale Criteria)  Interval: Initial Level of Consciousness (1a.)   : Alert, keenly responsive LOC Questions (1b. )   +: Answers both questions correctly LOC Commands (1c. )   + : Performs both tasks correctly Best Gaze (2. )  +: Normal Visual (3. )  +: No visual loss Facial Palsy (4. )    : Normal symmetrical movements Motor Arm, Left (5a. )   +: No drift Motor Arm, Right (5b. )   +: No drift Motor Leg, Left (6a. )   +: No drift Motor Leg, Right (6b. )   +: No drift Limb Ataxia (7. ): Absent Sensory (8. )   +: Normal, no sensory loss Best Language (9. )   +: Mild-to-moderate aphasia Dysarthria (10. ): Mild-to-moderate dysarthria, patient slurs at least some words and, at worst, can be understood with some difficulty Extinction/Inattention (11.)   +: No Abnormality Modified SS Total  +: 1 Complete NIHSS TOTAL: 2 Last date known well: 04/11/19 Last time known well: 2100 Neuro Assessment: Exceptions to WDL Neuro Checks:   Initial (04/12/19 1601)  Last Documented NIHSS Modified Score: 1 (04/12/19 1755) Has TPA been given? No If patient is a Neuro Trauma and patient is going to OR before floor call report to [place] nurse: [phone] or [phone]     R Recommendations: See Admitting Provider Note  Report given to:   Additional Notes:

## 2019-04-12 NOTE — ED Notes (Signed)
Pt given phone for MRI screening.

## 2019-04-12 NOTE — ED Notes (Signed)
Patient transported to CT 

## 2019-04-12 NOTE — H&P (Signed)
[facility] [place] NAME: Brianna Dodson    MR#:  [id number]  DATE OF BIRTH:  1934/05/12  DATE OF ADMISSION:  04/12/2019  PRIMARY CARE PHYSICIAN: [name], MD   REQUESTING/REFERRING PHYSICIAN: Dr Blake Divine  CHIEF COMPLAINT:   Chief Complaint  Patient presents with  . Aphasia    HISTORY OF PRESENT ILLNESS:  Brianna Dodson  is a 85 y.o. female with a known history of stroke with previous hospitalization last month.  Her MRI at that time showed scattered patchy acute infarcts in the right MCA territory in the right PCA territory.  Patient was treated at that time with aspirin and Plavix and cholesterol medication.  She presents to the hospital with symptoms of slurred speech which have been going on since yesterday at 3 PM as per the patient.  This patient's sister stated it has been going on since wanted 2 PM yesterday.  It was currently after 5 PM when I was notified to see her.  The patient does not complain of any weakness one side versus the other.  She did not take her aspirin or Plavix today.  Hospitalist services contacted for further evaluation  PAST MEDICAL HISTORY:   Past Medical History:  Diagnosis Date  . CKD (chronic kidney disease), stage III   . Diabetes mellitus without complication ([place])   . History of blood transfusion   . Hyperlipidemia   . Hypertension   . Stroke [facility])     PAST SURGICAL HISTORY:   Past Surgical History:  Procedure Laterality Date  . ABDOMINAL HYSTERECTOMY  1985    SOCIAL HISTORY:   Social History   Tobacco Use  . Smoking status: Never Smoker  . Smokeless tobacco: Never Used  Substance Use Topics  . Alcohol use: No    FAMILY HISTORY:   Family History  Problem Relation Age of Onset  . Stroke Mother   . Diabetes Mother   . Heart disease Father   . Kidney disease Sister   . Diabetes Sister   . Kidney disease Brother   . Heart disease Sister   . Diabetes Sister    . Diabetes Sister   . Diabetes Sister   . Kidney disease Sister   . Heart disease Sister   . Kidney disease Brother        kidney transplant  . Early death Brother 79       Truck Accident - died  . Heart disease Brother     DRUG ALLERGIES:   Allergies  Allergen Reactions  . Nsaids     CKD stage III - Avoid all nephrotoxic drugs    REVIEW OF SYSTEMS:  CONSTITUTIONAL: No fever, fatigue or weakness.  Positive for weight loss. EYES: No blurred or double vision.  EARS, NOSE, AND THROAT: No tinnitus or ear pain. No sore throat RESPIRATORY: No cough, shortness of breath, wheezing or hemoptysis.  CARDIOVASCULAR: No chest pain, orthopnea, edema.  GASTROINTESTINAL: No nausea, vomiting, diarrhea or abdominal pain. No blood in bowel movements.  Has seen some blood on the toilet paper. GENITOURINARY: No dysuria, hematuria.  ENDOCRINE: No polyuria, nocturia,  HEMATOLOGY: No anemia, easy bruising or bleeding SKIN: No rash or lesion. MUSCULOSKELETAL: No joint pain or arthritis.   NEUROLOGIC: No tingling, numbness, weakness.  PSYCHIATRY: No anxiety or depression.   MEDICATIONS AT HOME:   Prior to Admission medications   Medication Sig Start Date End Date Taking? Authorizing Provider  ACCU-CHEK GUIDE test strip  USE TO CHECK BLOOD SUGARS TWICE DAILY. E11.9 04/04/18  Yes [name], MD  amLODipine (NORVASC) 10 MG tablet TAKE 1 TABLET BY MOUTH EVERY DAY Patient taking differently: Take 10 mg by mouth daily.  04/07/19  Yes [name], MD  aspirin 81 MG tablet Take 81 mg by mouth daily.   Yes [provider]  blood glucose meter kit and supplies KIT Dispense based on patient and insurance preference. Check CBGs two times daily. E11.9. 01/25/18  Yes [name], MD  blood glucose meter kit and supplies Dispense based on patient and insurance preference. Use up to four times daily as directed. E11.9 09/14/16  Yes Cook, Jayce G, DO  clopidogrel (PLAVIX) 75 MG tablet  Take 1 tablet (75 mg total) by mouth daily. 03/17/19  Yes Vaughan Basta, MD  glimepiride (AMARYL) 2 MG tablet Take 2 mg by mouth daily with breakfast. 01/28/19  Yes [provider]  Lancets (ONETOUCH ULTRASOFT) lancets Use as instructed 09/14/16  Yes Cook, Jayce G, DO  rosuvastatin (CRESTOR) 40 MG tablet TAKE 1 TABLET BY MOUTH EVERY DAY Patient taking differently: Take 40 mg by mouth daily.  08/28/18  Yes [name], MD  Vitamin D, Ergocalciferol, (DRISDOL) 50000 units CAPS capsule Take 50,000 Units by mouth every 30 (thirty) days. 10/05/17  Yes [provider]      VITAL SIGNS:  Blood pressure (!) 172/89, pulse 69, temperature 98.4 F (36.9 C), temperature source Oral, resp. rate 16, height 5' 5"  (1.651 m), weight 86.2 kg, SpO2 99 %.  PHYSICAL EXAMINATION:  GENERAL:  85 y.o.-year-old patient lying in the bed with no acute distress.  EYES: Pupils equal, round, reactive to light and accommodation. No scleral icterus. Extraocular muscles intact.  HEENT: Head atraumatic, normocephalic. Oropharynx and nasopharynx clear.  NECK:  Supple, no jugular venous distention. No thyroid enlargement, no tenderness.  LUNGS: Normal breath sounds bilaterally, no wheezing, rales,rhonchi or crepitation. No use of accessory muscles of respiration.  CARDIOVASCULAR: S1, S2 normal. No murmurs, rubs, or gallops.  ABDOMEN: Soft, nontender, nondistended. Bowel sounds present. No organomegaly or mass.  EXTREMITIES: No pedal edema, cyanosis, or clubbing.  NEUROLOGIC: Cranial nerves II through XII are intact. Muscle strength 5/5 in all extremities. Sensation intact. Gait not checked.  PSYCHIATRIC: The patient is alert and oriented x 3.  SKIN: No rash, lesion, or ulcer.   LABORATORY PANEL:   CBC Recent Labs  Lab 04/12/19 1557  WBC 4.8  HGB 9.8*  HCT 29.0*  PLT 163    ------------------------------------------------------------------------------------------------------------------  Chemistries  Recent Labs  Lab 04/12/19 1557  NA 135  K 4.3  CL 101  CO2 24  GLUCOSE 455*  BUN 25*  CREATININE 2.19*  CALCIUM 8.9  AST 25  ALT 19  ALKPHOS 56  BILITOT 0.5   ------------------------------------------------------------------------------------------------------------------  Cardiac Enzymes No results for input(s): TROPONINI in the last 168 hours. ------------------------------------------------------------------------------------------------------------------  RADIOLOGY:  Ct Head Wo Contrast  Result Date: 04/12/2019 CLINICAL DATA:  Speech difficulty since yesterday evening. EXAM: CT HEAD WITHOUT CONTRAST TECHNIQUE: Contiguous axial images were obtained from the base of the skull through the vertex without intravenous contrast. COMPARISON:  CT head and MR brain dated March 15, 2019. FINDINGS: Brain: No evidence of acute infarction, hemorrhage, hydrocephalus, extra-axial collection or mass lesion/mass effect. Stable mild atrophy and moderate chronic microvascular ischemic changes. Vascular: Calcified atherosclerosis at the skullbase. No hyperdense vessel. Skull: Normal. Negative for fracture or focal lesion. Sinuses/Orbits: No acute finding. Other: None. IMPRESSION: 1.  No acute intracranial abnormality. 2. Stable mild atrophy and moderate chronic microvascular ischemic changes. Electronically Signed   By: Titus Dubin M.D.   On: 04/12/2019 16:37    EKG:   Sinus rhythm 76 bpm, left axis deviation flattening of T waves laterally.  IMPRESSION AND PLAN:   1.  Slurred speech and aphasia with starting symptoms over 24 hours since yesterday.  Restart aspirin and Plavix.  Neurology consultation.  We will get an MRI of the brain.  Patient had an echocardiogram and carotid ultrasound last time.  Will wait for neurology consultation on whether the  patient needs a TEE.  Monitor on telemetry for arrhythmias.  We will also get an MRA of the carotids without contrast. 2.  Type 2 diabetes complicated with chronic kidney disease stage III.  Patient on low-dose Amaryl and will put on sliding scale.  Last A1c 7.4. 3.  Hypertension.  Allow permissive hypertension today and restart Norvasc and lisinopril for tomorrow. 4.  Hyperlipidemia unspecified on high-dose Crestor 5.  Anemia.  Check iron studies      All the records are reviewed and case discussed with ED provider. Management plans discussed with the patient, family and they are in agreement.  CODE STATUS: full code  TOTAL TIME TAKING CARE OF THIS PATIENT: 50 minutes.    Loletha Grayer M.D on 04/12/2019 at 5:46 PM  Between 7am to 6pm - Pager - [phone]  After 6pm call admission pager [phone]  [facility]  [phone]  CC: Primary care physician; [name], MD

## 2019-04-12 NOTE — ED Notes (Signed)
This RN and Jinny Blossom, RN reposition pt in bed. Pt comfortable. VSS. Pt in NAD. Will continue to monitor.

## 2019-04-12 NOTE — ED Notes (Signed)
Dr Jessup at bedside 

## 2019-04-13 ENCOUNTER — Encounter: Payer: Self-pay | Admitting: Radiology

## 2019-04-13 ENCOUNTER — Other Ambulatory Visit: Payer: Self-pay

## 2019-04-13 LAB — CBC
HCT: 25.5 % — ABNORMAL LOW (ref 36.0–46.0)
Hemoglobin: 8.7 g/dL — ABNORMAL LOW (ref 12.0–15.0)
MCH: 28.9 pg (ref 26.0–34.0)
MCHC: 34.1 g/dL (ref 30.0–36.0)
MCV: 84.7 fL (ref 80.0–100.0)
Platelets: 139 10*3/uL — ABNORMAL LOW (ref 150–400)
RBC: 3.01 MIL/uL — ABNORMAL LOW (ref 3.87–5.11)
RDW: 12.1 % (ref 11.5–15.5)
WBC: 3.9 10*3/uL — ABNORMAL LOW (ref 4.0–10.5)
nRBC: 0 % (ref 0.0–0.2)

## 2019-04-13 LAB — LIPID PANEL
Cholesterol: 115 mg/dL (ref 0–200)
HDL: 35 mg/dL — ABNORMAL LOW (ref >40)
LDL Cholesterol: 52 mg/dL (ref 0–99)
Total CHOL/HDL Ratio: 3.3 RATIO
Triglycerides: 139 mg/dL (ref <150)
VLDL: 28 mg/dL (ref 0–40)

## 2019-04-13 LAB — BASIC METABOLIC PANEL
Anion gap: 8 (ref 5–15)
BUN: 23 mg/dL (ref 8–23)
CO2: 24 mmol/L (ref 22–32)
Calcium: 8.5 mg/dL — ABNORMAL LOW (ref 8.9–10.3)
Chloride: 106 mmol/L (ref 98–111)
Creatinine, Ser: 1.95 mg/dL — ABNORMAL HIGH (ref 0.44–1.00)
GFR calc Af Amer: 27 mL/min — ABNORMAL LOW (ref >60)
GFR calc non Af Amer: 23 mL/min — ABNORMAL LOW (ref >60)
Glucose, Bld: 269 mg/dL — ABNORMAL HIGH (ref 70–99)
Potassium: 4.4 mmol/L (ref 3.5–5.1)
Sodium: 138 mmol/L (ref 135–145)

## 2019-04-13 LAB — GLUCOSE, CAPILLARY
Glucose-Capillary: 216 mg/dL — ABNORMAL HIGH (ref 70–99)
Glucose-Capillary: 243 mg/dL — ABNORMAL HIGH (ref 70–99)
Glucose-Capillary: 126 mg/dL — ABNORMAL HIGH (ref 70–99)
Glucose-Capillary: 147 mg/dL — ABNORMAL HIGH (ref 70–99)

## 2019-04-13 LAB — HEMOGLOBIN A1C
Hgb A1c MFr Bld: 8.8 % — ABNORMAL HIGH (ref 4.8–5.6)
Mean Plasma Glucose: 205.86 mg/dL

## 2019-04-13 LAB — SARS CORONAVIRUS 2 (TAT 6-24 HRS): SARS Coronavirus 2: NEGATIVE

## 2019-04-13 NOTE — Consult Note (Signed)
Reason for Consult: Stroke   Physician: Hsopitralist  CC: Stroke  HPI: Brianna Dodson is an 85 y.o. female with a history of HTN, DM,  HLD, recent admission for right sided stroke on 03/2019 on ASA/Plavix who presented to the emergency room with slurred speech and trouble finding words. Patient states that she s been having slurred speech and trouble findings words since Friday afternoon. NO ha, no visual disturbances, no motor weakness, no tingling/no numbness. No LOC. Sppeech disturbances did not improve and triggered presentation to ER.  Patient with recent admission on 03/2019 for stroke:  MRI on 9/20: Scattered, patchy acute infarcts in the right MCA territory white matter and the right PCA territory. As it appears there is a fetal type right PCA origin (normal vascular variant), this might reflect a recent embolic event from the right carotid. No associated hemorrhage or mass effect. Underlying moderately advanced signal changes suggesting chronic small vessel disease, including chronic microhemorrhages in the thalami and right occipital pole.  Echo 03/2019: ef of 6%. See report EMR  US carotid 9/20: Bilateral carotid atherosclerosis. No hemodynamically significant ICA stenosis. Degree of narrowing less than 50% bilaterally by ultrasound criteria.  Patient discharged on ASA/PLavix  In ER work up included:   Head ct on 10/10: No acute intracranial abnormality.Stable mild atrophy and moderate chronic microvascular ischemicchanges.  MRI/MRA on 10/10: Multiple new foci of acute infarction affect the RIGHT hemisphereaffecting the frontal, posterior frontal, posterior temporal, anterior parietal cortex and regional white matter. The linear distribution of the lesions raises the question of watershed type hypoperfusion insult, versus a shower of emboli.Moderate atrophy with extensive small vessel disease.No extracranial flow reducing lesion is evident, within limits for detection on  noncontrast MRA neck.  This Am, patient states that her speech is little better but still has trble finding words  Past Medical History:  Diagnosis Date  . CKD (chronic kidney disease), stage III   . Diabetes mellitus without complication ([place])   . History of blood transfusion   . Hyperlipidemia   . Hypertension   . Stroke [facility])     Past Surgical History:  Procedure Laterality Date  . ABDOMINAL HYSTERECTOMY  1985    Family History  Problem Relation Age of Onset  . Stroke Mother   . Diabetes Mother   . Heart disease Father   . Kidney disease Sister   . Diabetes Sister   . Kidney disease Brother   . Heart disease Sister   . Diabetes Sister   . Diabetes Sister   . Diabetes Sister   . Kidney disease Sister   . Heart disease Sister   . Kidney disease Brother        kidney transplant  . Early death Brother 58       Truck Accident - died  . Heart disease Brother     Social History:  reports that she has never smoked. She has never used smokeless tobacco. She reports that she does not drink alcohol or use drugs.  Allergies  Allergen Reactions  . Nsaids     CKD stage III - Avoid all nephrotoxic drugs    Medications: I have reviewed the patient's current medications.  ROS: As per HPI Physical Examination: Blood pressure 135/65, pulse (!) 56, temperature 98.3 F (36.8 C), temperature source Oral, resp. rate 16, height 5\' 5"  (1.651 m), weight 83.6 kg, SpO2 96 %.  Neurologic Examination Alert, oriented X3, expressive aphasia, follows commands PERLA, EOMI, VFF, no nystagmus, face symmetrical; ( edentoulous),  Results for orders placed or performed during the hospital encounter of 04/12/19 (from the past 48 hour(s))  Glucose, capillary     Status: Abnormal   Collection Time: 04/12/19  3:51 PM  Result Value Ref Range   Glucose-Capillary 445 (H) 70 - 99 mg/dL  Comprehensive metabolic panel     Status: Abnormal   Collection Time: 04/12/19  3:57 PM  Result Value Ref  Range   Sodium 135 135 - 145 mmol/L   Potassium 4.3 3.5 - 5.1 mmol/L   Chloride 101 98 - 111 mmol/L   CO2 24 22 - 32 mmol/L   Glucose, Bld 455 (H) 70 - 99 mg/dL   BUN 25 (H) 8 - 23 mg/dL   Creatinine, Ser 2.19 (H) 0.44 - 1.00 mg/dL   Calcium 8.9 8.9 - 10.3 mg/dL   Total Protein 8.0 6.5 - 8.1 g/dL   Albumin 4.0 3.5 - 5.0 g/dL   AST 25 15 - 41 U/L   ALT 19 0 - 44 U/L   Alkaline Phosphatase 56 38 - 126 U/L   Total Bilirubin 0.5 0.3 - 1.2 mg/dL   GFR calc non Af Amer 20 (L) >60 mL/min   GFR calc Af Amer 23 (L) >60 mL/min   Anion gap 10 5 - 15    Comment: Performed at [facility], [address] [place], [place] [address]  CBC with Differential     Status: Abnormal   Collection Time: 04/12/19  3:57 PM  Result Value Ref Range   WBC 4.8 4.0 - 10.5 K/uL   RBC 3.45 (L) 3.87 - 5.11 MIL/uL   Hemoglobin 9.8 (L) 12.0 - 15.0 g/dL   HCT 29.0 (L) 36.0 - 46.0 %   MCV 84.1 80.0 - 100.0 fL   MCH 28.4 26.0 - 34.0 pg   MCHC 33.8 30.0 - 36.0 g/dL   RDW 12.2 11.5 - 15.5 %   Platelets 163 150 - 400 K/uL   nRBC 0.0 0.0 - 0.2 %   Neutrophils Relative % 63 %   Neutro Abs 3.0 1.7 - 7.7 K/uL   Lymphocytes Relative 25 %   Lymphs Abs 1.2 0.7 - 4.0 K/uL   Monocytes Relative 8 %   Monocytes Absolute 0.4 0.1 - 1.0 K/uL   Eosinophils Relative 3 %   Eosinophils Absolute 0.2 0.0 - 0.5 K/uL   Basophils Relative 1 %   Basophils Absolute 0.0 0.0 - 0.1 K/uL   Immature Granulocytes 0 %   Abs Immature Granulocytes 0.02 0.00 - 0.07 K/uL    Comment: Performed at [facility], [place], [place] [address]  Troponin I (High Sensitivity)     Status: None   Collection Time: 04/12/19  3:57 PM  Result Value Ref Range   Troponin I (High Sensitivity) 6 <18 ng/L    Comment: (NOTE) Elevated high sensitivity troponin I (hsTnI) values and significant  changes across serial measurements may suggest ACS but many other  chronic and acute conditions are known to elevate hsTnI results.   Refer to the "Links" section for chest pain algorithms and additional  guidance. Performed at [facility], [address] [place], [place] [address]   Ferritin     Status: None   Collection Time: 04/12/19  3:57 PM  Result Value Ref Range   Ferritin 105 11 - 307 ng/mL    Comment: Performed at [facility], [address] [place], [place] [address]  Iron and TIBC  Status: None   Collection Time: 04/12/19  3:57 PM  Result Value Ref Range   Iron 57 28 - 170 ug/dL   TIBC 266 250 - 450 ug/dL   Saturation Ratios 21 10.4 - 31.8 %   UIBC 209 ug/dL    Comment: Performed at [facility], [address] [place], [place] [address]  Hemoglobin A1c     Status: Abnormal   Collection Time: 04/12/19  3:57 PM  Result Value Ref Range   Hgb A1c MFr Bld 8.8 (H) 4.8 - 5.6 %    Comment: (NOTE) Pre diabetes:          5.7%-6.4% Diabetes:              >6.4% Glycemic control for   <7.0% adults with diabetes    Mean Plasma Glucose 205.86 mg/dL    Comment: Performed at [facility] [address] [place], [place] [address]  Troponin I (High Sensitivity)     Status: None   Collection Time: 04/12/19  7:59 PM  Result Value Ref Range   Troponin I (High Sensitivity) 6 <18 ng/L    Comment: (NOTE) Elevated high sensitivity troponin I (hsTnI) values and significant  changes across serial measurements may suggest ACS but many other  chronic and acute conditions are known to elevate hsTnI results.  Refer to the "Links" section for chest pain algorithms and additional  guidance. Performed at [facility], [place], [place] [address]   SARS CORONAVIRUS 2 (TAT 6-24 HRS) Nasopharyngeal Nasopharyngeal Swab     Status: None   Collection Time: 04/12/19  7:59 PM   Specimen: Nasopharyngeal Swab  Result Value Ref Range   SARS Coronavirus 2 NEGATIVE NEGATIVE    Comment: (NOTE) SARS-CoV-2 target nucleic acids are NOT DETECTED. The SARS-CoV-2 RNA  is generally detectable in upper and lower respiratory specimens during the acute phase of infection. Negative results do not preclude SARS-CoV-2 infection, do not rule out co-infections with other pathogens, and should not be used as the sole basis for treatment or other patient management decisions. Negative results must be combined with clinical observations, patient history, and epidemiological information. The expected result is Negative. Fact Sheet for Patients: SugarRoll.be Fact Sheet for Healthcare Providers: https://www.woods-mathews.com/ This test is not yet approved or cleared by the [place] FDA and  has been authorized for detection and/or diagnosis of SARS-CoV-2 by FDA under an Emergency Use Authorization (EUA). This EUA will remain  in effect (meaning this test can be used) for the duration of the COVID-19 declaration under Section 56 4(b)(1) of the Act, 21 U.S.C. section 360bbb-3(b)(1), unless the authorization is terminated or revoked sooner. Performed at [facility], [place] [address] [place], [place] [address]   Glucose, capillary     Status: Abnormal   Collection Time: 04/12/19  8:53 PM  Result Value Ref Range   Glucose-Capillary 196 (H) 70 - 99 mg/dL  Lipid panel     Status: Abnormal   Collection Time: 04/13/19  4:29 AM  Result Value Ref Range   Cholesterol 115 0 - 200 mg/dL   Triglycerides 139 <150 mg/dL   HDL 35 (L) >40 mg/dL   Total CHOL/HDL Ratio 3.3 RATIO   VLDL 28 0 - 40 mg/dL   LDL Cholesterol 52 0 - 99 mg/dL    Comment:        Total Cholesterol/HDL:CHD Risk Coronary Heart Disease Risk Table  Men   Women  1/2 Average Risk   3.4   3.3  Average Risk       5.0   4.4  2 X Average Risk   9.6   7.1  3 X Average Risk  23.4   11.0        Use the calculated Patient Ratio above and the CHD Risk Table to determine the patient's CHD Risk.        ATP III CLASSIFICATION (LDL):  <100      mg/dL   Optimal  100-129  mg/dL   Near or Above                    Optimal  130-159  mg/dL   Borderline  160-189  mg/dL   High  >190     mg/dL   Very High Performed at [facility], [address] [place], [place] [address]   CBC     Status: Abnormal   Collection Time: 04/13/19  4:29 AM  Result Value Ref Range   WBC 3.9 (L) 4.0 - 10.5 K/uL   RBC 3.01 (L) 3.87 - 5.11 MIL/uL   Hemoglobin 8.7 (L) 12.0 - 15.0 g/dL   HCT 25.5 (L) 36.0 - 46.0 %   MCV 84.7 80.0 - 100.0 fL   MCH 28.9 26.0 - 34.0 pg   MCHC 34.1 30.0 - 36.0 g/dL   RDW 12.1 11.5 - 15.5 %   Platelets 139 (L) 150 - 400 K/uL   nRBC 0.0 0.0 - 0.2 %    Comment: Performed at [facility], [address] [place], [place] [address]  Basic metabolic panel     Status: Abnormal   Collection Time: 04/13/19  4:29 AM  Result Value Ref Range   Sodium 138 135 - 145 mmol/L   Potassium 4.4 3.5 - 5.1 mmol/L   Chloride 106 98 - 111 mmol/L   CO2 24 22 - 32 mmol/L   Glucose, Bld 269 (H) 70 - 99 mg/dL   BUN 23 8 - 23 mg/dL   Creatinine, Ser 1.95 (H) 0.44 - 1.00 mg/dL   Calcium 8.5 (L) 8.9 - 10.3 mg/dL   GFR calc non Af Amer 23 (L) >60 mL/min   GFR calc Af Amer 27 (L) >60 mL/min   Anion gap 8 5 - 15    Comment: Performed at [facility], [address] [place], [place] [address]    Recent Results (from the past 240 hour(s))  SARS CORONAVIRUS 2 (TAT 6-24 HRS) Nasopharyngeal Nasopharyngeal Swab     Status: None   Collection Time: 04/12/19  7:59 PM   Specimen: Nasopharyngeal Swab  Result Value Ref Range Status   SARS Coronavirus 2 NEGATIVE NEGATIVE Final    Comment: (NOTE) SARS-CoV-2 target nucleic acids are NOT DETECTED. The SARS-CoV-2 RNA is generally detectable in upper and lower respiratory specimens during the acute phase of infection. Negative results do not preclude SARS-CoV-2 infection, do not rule out co-infections with other pathogens, and should not be used as the sole basis for treatment or  other patient management decisions. Negative results must be combined with clinical observations, patient history, and epidemiological information. The expected result is Negative. Fact Sheet for Patients: SugarRoll.be Fact Sheet for Healthcare Providers: https://www.woods-mathews.com/ This test is not yet approved or cleared by the [place] FDA and  has been authorized for detection and/or diagnosis of SARS-CoV-2 by FDA under an Emergency Use Authorization (EUA). This EUA will remain  in effect (meaning this test can be used) for the duration of the COVID-19 declaration under Section 56 4(b)(1) of the Act, 21 U.S.C. section 360bbb-3(b)(1), unless the authorization is terminated or revoked sooner. Performed at [facility], [place] [address] [place], [place] [address]     Ct Head Wo Contrast  Result Date: 04/12/2019 CLINICAL DATA:  Speech difficulty since yesterday evening. EXAM: CT HEAD WITHOUT CONTRAST TECHNIQUE: Contiguous axial images were obtained from the base of the skull through the vertex without intravenous contrast. COMPARISON:  CT head and MR brain dated March 15, 2019. FINDINGS: Brain: No evidence of acute infarction, hemorrhage, hydrocephalus, extra-axial collection or mass lesion/mass effect. Stable mild atrophy and moderate chronic microvascular ischemic changes. Vascular: Calcified atherosclerosis at the skullbase. No hyperdense vessel. Skull: Normal. Negative for fracture or focal lesion. Sinuses/Orbits: No acute finding. Other: None. IMPRESSION: 1.  No acute intracranial abnormality. 2. Stable mild atrophy and moderate chronic microvascular ischemic changes. Electronically Signed   By: Titus Dubin M.D.   On: 04/12/2019 16:37   Mr Angio Neck Wo Contrast  Result Date: 04/12/2019 CLINICAL DATA:  Speech difficulty began yesterday. Dysarthria and expressive aphasia. EXAM: MRI HEAD WITHOUT CONTRAST MRA NECK WITHOUT  CONTRAST TECHNIQUE: Multiplanar, multiecho pulse sequences of the brain and surrounding structures were obtained without intravenous contrast. Angiographic images of the neck were obtained using MRA technique without intravenous contrast. Carotid stenosis measurements (when applicable) are obtained utilizing NASCET criteria, using the distal internal carotid diameter as the denominator. CONTRAST:  None. COMPARISON:  CT head earlier in the day.  MR brain 03/15/2019 FINDINGS: MRI HEAD FINDINGS Brain: Multiple subcentimeter foci of restricted diffusion, corresponding low ADC, T2 and FLAIR hyperintense, affect the RIGHT hemisphere involving the frontal, posterior frontal, posterior temporal, and anterior parietal cortex and regional white matter. The linear distribution of the subcortical and periventricular white matter lesions raises the question of watershed type hypoperfusion insult, versus a shower of emboli within the RIGHT anterior circulation. Of note, the same hemisphere was affected with multiple focal and confluent areas of infarction on the September 2020 MR scan but today's areas are clearly new and reflect involvement in different locations. No hemorrhage, mass lesion, hydrocephalus, or extra-axial fluid. Moderate cerebral and cerebellar atrophy. Extensive T2 and FLAIR hyperintensities throughout the periventricular and subcortical white matter, consistent with small vessel disease. Scattered areas of chronic lacunar infarction affect the deep nuclei and BILATERAL subcortical white matter. Vascular: Flow voids are maintained. Small foci of susceptibility are redemonstrated in the RIGHT occipital lobe. Skull and upper cervical spine: Normal marrow signal. The pituitary is mildly prominent for age, but there is no suprasellar extension. Sinuses/Orbits: No acute findings. Other: None. MRA NECK FINDINGS Noncontrast examination is limited to examination the carotid arteries above the arch. The study  demonstrates tortuous but widely patent common carotid arteries, internal carotid arteries, and external carotid arteries. No visible stenosis or dissection at the bifurcation. BILATERAL vertebral arteries are assessed primarily in their V2 segments. These arteries are tortuous but widely patent. IMPRESSION: 1. Multiple new foci of acute infarction affect the RIGHT hemisphere affecting the frontal, posterior frontal, posterior temporal, anterior parietal cortex and regional white matter. The linear distribution of the lesions raises the question of watershed type hypoperfusion insult, versus a shower of emboli. 2. Moderate atrophy with extensive small vessel disease. 3. No extracranial flow reducing lesion is evident, within limits for detection on noncontrast MRA neck. Electronically Signed   By: Staci Righter M.D.   On:  04/12/2019 20:33   Mr Brain Wo Contrast  Result Date: 04/12/2019 CLINICAL DATA:  Speech difficulty began yesterday. Dysarthria and expressive aphasia. EXAM: MRI HEAD WITHOUT CONTRAST MRA NECK WITHOUT CONTRAST TECHNIQUE: Multiplanar, multiecho pulse sequences of the brain and surrounding structures were obtained without intravenous contrast. Angiographic images of the neck were obtained using MRA technique without intravenous contrast. Carotid stenosis measurements (when applicable) are obtained utilizing NASCET criteria, using the distal internal carotid diameter as the denominator. CONTRAST:  None. COMPARISON:  CT head earlier in the day.  MR brain 03/15/2019 FINDINGS: MRI HEAD FINDINGS Brain: Multiple subcentimeter foci of restricted diffusion, corresponding low ADC, T2 and FLAIR hyperintense, affect the RIGHT hemisphere involving the frontal, posterior frontal, posterior temporal, and anterior parietal cortex and regional white matter. The linear distribution of the subcortical and periventricular white matter lesions raises the question of watershed type hypoperfusion insult, versus a  shower of emboli within the RIGHT anterior circulation. Of note, the same hemisphere was affected with multiple focal and confluent areas of infarction on the September 2020 MR scan but today's areas are clearly new and reflect involvement in different locations. No hemorrhage, mass lesion, hydrocephalus, or extra-axial fluid. Moderate cerebral and cerebellar atrophy. Extensive T2 and FLAIR hyperintensities throughout the periventricular and subcortical white matter, consistent with small vessel disease. Scattered areas of chronic lacunar infarction affect the deep nuclei and BILATERAL subcortical white matter. Vascular: Flow voids are maintained. Small foci of susceptibility are redemonstrated in the RIGHT occipital lobe. Skull and upper cervical spine: Normal marrow signal. The pituitary is mildly prominent for age, but there is no suprasellar extension. Sinuses/Orbits: No acute findings. Other: None. MRA NECK FINDINGS Noncontrast examination is limited to examination the carotid arteries above the arch. The study demonstrates tortuous but widely patent common carotid arteries, internal carotid arteries, and external carotid arteries. No visible stenosis or dissection at the bifurcation. BILATERAL vertebral arteries are assessed primarily in their V2 segments. These arteries are tortuous but widely patent. IMPRESSION: 1. Multiple new foci of acute infarction affect the RIGHT hemisphere affecting the frontal, posterior frontal, posterior temporal, anterior parietal cortex and regional white matter. The linear distribution of the lesions raises the question of watershed type hypoperfusion insult, versus a shower of emboli. 2. Moderate atrophy with extensive small vessel disease. 3. No extracranial flow reducing lesion is evident, within limits for detection on noncontrast MRA neck. Electronically Signed   By: Staci Righter M.D.   On: 04/12/2019 20:33     Assessment/Plan: 85 y/o with a history of HTN, DM,  HLD,  recent admission for right sided stroke on 03/2019 on ASA/Plavix admitted with slurred speech and trouble finding words  In whom euro exam expressive aphasia and slight dysarthria,  MRI/MRA hows  Multiple new foci of acute infarction affect the RIGHThemisphereaffecting the frontal, posterior frontal, posterior temporal,anterior parietal cortex and regional white matter. The lineardistribution of the lesions raises the question of watershed type hypoperfusion insult, versus a shower of emboli.Moderate atrophy with extensive small vessel disease.No extracranial flow reducing lesion is evident, within limits for detection on noncontrast MRA neck.    RECS: - Neuro protective measures including normothermia, normoglycemia, correct electrolytes/metabolic abnlites, treat any infection while admitted - Permissive BHP for now - Patient had a full stroke w/ip on recent admission. Will repeat US carotid and obtain a TEE (since patient already had TTE on sept). May be candidate for LINK - Continue ASA/Plavix. Consider  NOAC after TEE if no CI - PT/OT/SWE  04/13/2019, 9:06  AM

## 2019-04-13 NOTE — Progress Notes (Addendum)
Brianna Dodson at [place] NAME: Brianna Dodson    MR#:  [id number]  DATE OF BIRTH:  1933-10-11  SUBJECTIVE:  CHIEF COMPLAINT:   Chief Complaint  Patient presents with   Aphasia   No new complaint this morning.  Patient passed swallow screening.  Having breakfast and tolerating diet well.  Being evaluated for acute CVA.  Patient presented with aphasia and slurred speech  REVIEW OF SYSTEMS:  ROS CONSTITUTIONAL: No fever, fatigue or weakness.  Positive for weight loss. EYES: No blurred or double vision.  EARS, NOSE, AND THROAT: No tinnitus or ear pain. No sore throat RESPIRATORY: No cough, shortness of breath, wheezing or hemoptysis.  CARDIOVASCULAR: No chest pain, orthopnea, edema.  GASTROINTESTINAL: No nausea, vomiting, diarrhea or abdominal pain. No blood in bowel movements.  Has seen some blood on the toilet paper. GENITOURINARY: No dysuria, hematuria.  ENDOCRINE: No polyuria, nocturia,  HEMATOLOGY: No anemia, easy bruising or bleeding SKIN: No rash or lesion. MUSCULOSKELETAL: No joint pain or arthritis.   NEUROLOGIC: No tingling, numbness, weakness.  PSYCHIATRY: No anxiety or depression.  DRUG ALLERGIES:   Allergies  Allergen Reactions   Nsaids     CKD stage III - Avoid all nephrotoxic drugs   VITALS:  Blood pressure (!) 157/70, pulse 61, temperature 98.5 F (36.9 C), temperature source Oral, resp. rate 18, height 5\' 5"  (1.651 m), weight 83.6 kg, SpO2 100 %. PHYSICAL EXAMINATION:  Physical Exam  GENERAL:  85 y.o.-year-old patient lying in the bed with no acute distress.  EYES: Pupils equal, round, reactive to light and accommodation. No scleral icterus. Extraocular muscles intact.  HEENT: Head atraumatic, normocephalic. Oropharynx and nasopharynx clear.  NECK:  Supple, no jugular venous distention. No thyroid enlargement, no tenderness.  LUNGS: Normal breath sounds bilaterally, no wheezing, rales,rhonchi or crepitation. No use of  accessory muscles of respiration.  CARDIOVASCULAR: S1, S2 normal. No murmurs, rubs, or gallops.  ABDOMEN: Soft, nontender, nondistended. Bowel sounds present. No organomegaly or mass.  EXTREMITIES: No pedal edema, cyanosis, or clubbing.  NEUROLOGIC: Generalized weakness with no focal deficits.   Gait not checked.  PSYCHIATRIC: The patient is alert and oriented x 3.  SKIN: No rash, lesion, or ulcer.   LABORATORY PANEL:  Female CBC Recent Labs  Lab 04/13/19 0429  WBC 3.9*  HGB 8.7*  HCT 25.5*  PLT 139*   ------------------------------------------------------------------------------------------------------------------ Chemistries  Recent Labs  Lab 04/12/19 1557 04/13/19 0429  NA 135 138  K 4.3 4.4  CL 101 106  CO2 24 24  GLUCOSE 455* 269*  BUN 25* 23  CREATININE 2.19* 1.95*  CALCIUM 8.9 8.5*  AST 25  --   ALT 19  --   ALKPHOS 56  --   BILITOT 0.5  --    RADIOLOGY:  Ct Head Wo Contrast  Result Date: 04/12/2019 CLINICAL DATA:  Speech difficulty since yesterday evening. EXAM: CT HEAD WITHOUT CONTRAST TECHNIQUE: Contiguous axial images were obtained from the base of the skull through the vertex without intravenous contrast. COMPARISON:  CT head and MR brain dated March 15, 2019. FINDINGS: Brain: No evidence of acute infarction, hemorrhage, hydrocephalus, extra-axial collection or mass lesion/mass effect. Stable mild atrophy and moderate chronic microvascular ischemic changes. Vascular: Calcified atherosclerosis at the skullbase. No hyperdense vessel. Skull: Normal. Negative for fracture or focal lesion. Sinuses/Orbits: No acute finding. Other: None. IMPRESSION: 1.  No acute intracranial abnormality. 2. Stable mild atrophy and moderate chronic microvascular ischemic changes. Electronically Signed  By: Titus Dubin M.D.   On: 04/12/2019 16:37   Mr Angio Neck Wo Contrast  Result Date: 04/12/2019 CLINICAL DATA:  Speech difficulty began yesterday. Dysarthria and expressive  aphasia. EXAM: MRI HEAD WITHOUT CONTRAST MRA NECK WITHOUT CONTRAST TECHNIQUE: Multiplanar, multiecho pulse sequences of the brain and surrounding structures were obtained without intravenous contrast. Angiographic images of the neck were obtained using MRA technique without intravenous contrast. Carotid stenosis measurements (when applicable) are obtained utilizing NASCET criteria, using the distal internal carotid diameter as the denominator. CONTRAST:  None. COMPARISON:  CT head earlier in the day.  MR brain 03/15/2019 FINDINGS: MRI HEAD FINDINGS Brain: Multiple subcentimeter foci of restricted diffusion, corresponding low ADC, T2 and FLAIR hyperintense, affect the RIGHT hemisphere involving the frontal, posterior frontal, posterior temporal, and anterior parietal cortex and regional white matter. The linear distribution of the subcortical and periventricular white matter lesions raises the question of watershed type hypoperfusion insult, versus a shower of emboli within the RIGHT anterior circulation. Of note, the same hemisphere was affected with multiple focal and confluent areas of infarction on the September 2020 MR scan but today's areas are clearly new and reflect involvement in different locations. No hemorrhage, mass lesion, hydrocephalus, or extra-axial fluid. Moderate cerebral and cerebellar atrophy. Extensive T2 and FLAIR hyperintensities throughout the periventricular and subcortical white matter, consistent with small vessel disease. Scattered areas of chronic lacunar infarction affect the deep nuclei and BILATERAL subcortical white matter. Vascular: Flow voids are maintained. Small foci of susceptibility are redemonstrated in the RIGHT occipital lobe. Skull and upper cervical spine: Normal marrow signal. The pituitary is mildly prominent for age, but there is no suprasellar extension. Sinuses/Orbits: No acute findings. Other: None. MRA NECK FINDINGS Noncontrast examination is limited to examination  the carotid arteries above the arch. The study demonstrates tortuous but widely patent common carotid arteries, internal carotid arteries, and external carotid arteries. No visible stenosis or dissection at the bifurcation. BILATERAL vertebral arteries are assessed primarily in their V2 segments. These arteries are tortuous but widely patent. IMPRESSION: 1. Multiple new foci of acute infarction affect the RIGHT hemisphere affecting the frontal, posterior frontal, posterior temporal, anterior parietal cortex and regional white matter. The linear distribution of the lesions raises the question of watershed type hypoperfusion insult, versus a shower of emboli. 2. Moderate atrophy with extensive small vessel disease. 3. No extracranial flow reducing lesion is evident, within limits for detection on noncontrast MRA neck. Electronically Signed   By: Staci Righter M.D.   On: 04/12/2019 20:33   Mr Brain Wo Contrast  Result Date: 04/12/2019 CLINICAL DATA:  Speech difficulty began yesterday. Dysarthria and expressive aphasia. EXAM: MRI HEAD WITHOUT CONTRAST MRA NECK WITHOUT CONTRAST TECHNIQUE: Multiplanar, multiecho pulse sequences of the brain and surrounding structures were obtained without intravenous contrast. Angiographic images of the neck were obtained using MRA technique without intravenous contrast. Carotid stenosis measurements (when applicable) are obtained utilizing NASCET criteria, using the distal internal carotid diameter as the denominator. CONTRAST:  None. COMPARISON:  CT head earlier in the day.  MR brain 03/15/2019 FINDINGS: MRI HEAD FINDINGS Brain: Multiple subcentimeter foci of restricted diffusion, corresponding low ADC, T2 and FLAIR hyperintense, affect the RIGHT hemisphere involving the frontal, posterior frontal, posterior temporal, and anterior parietal cortex and regional white matter. The linear distribution of the subcortical and periventricular white matter lesions raises the question of  watershed type hypoperfusion insult, versus a shower of emboli within the RIGHT anterior circulation. Of note, the same hemisphere was affected  with multiple focal and confluent areas of infarction on the September 2020 MR scan but today's areas are clearly new and reflect involvement in different locations. No hemorrhage, mass lesion, hydrocephalus, or extra-axial fluid. Moderate cerebral and cerebellar atrophy. Extensive T2 and FLAIR hyperintensities throughout the periventricular and subcortical white matter, consistent with small vessel disease. Scattered areas of chronic lacunar infarction affect the deep nuclei and BILATERAL subcortical white matter. Vascular: Flow voids are maintained. Small foci of susceptibility are redemonstrated in the RIGHT occipital lobe. Skull and upper cervical spine: Normal marrow signal. The pituitary is mildly prominent for age, but there is no suprasellar extension. Sinuses/Orbits: No acute findings. Other: None. MRA NECK FINDINGS Noncontrast examination is limited to examination the carotid arteries above the arch. The study demonstrates tortuous but widely patent common carotid arteries, internal carotid arteries, and external carotid arteries. No visible stenosis or dissection at the bifurcation. BILATERAL vertebral arteries are assessed primarily in their V2 segments. These arteries are tortuous but widely patent. IMPRESSION: 1. Multiple new foci of acute infarction affect the RIGHT hemisphere affecting the frontal, posterior frontal, posterior temporal, anterior parietal cortex and regional white matter. The linear distribution of the lesions raises the question of watershed type hypoperfusion insult, versus a shower of emboli. 2. Moderate atrophy with extensive small vessel disease. 3. No extracranial flow reducing lesion is evident, within limits for detection on noncontrast MRA neck. Electronically Signed   By: Staci Righter M.D.   On: 04/12/2019 20:33   ASSESSMENT AND  PLAN:   1.  Acute CVA Patient presented with slurred speech and aphasia with starting symptoms over 24 hours prior to admission.  Patient had MRI of the brain done which revealed multiple foci of acute infarction affecting the right hemisphere suggestive of watershed infarction..  Seen by neurologist.  Recommendation is for TEE.  I have discussed case with cardiologist on-call Dr. Caryl Comes who will see patient for TEE in a.m.  We will keep n.p.o. after midnight.  Decision regarding anticoagulation will be made after TEE done. No extracranial flow reducing lesion is evident, within limits for detection on noncontrast MRA neck.  No evidence of hypotension.  Continue telemetry monitoring.  2.  Type 2 diabetes complicated with chronic kidney disease stage III.  Patient on low-dose Amaryl and will put on sliding scale.  Last A1c 7.4.  3.  Hypertension.  Allow permissive hypertension and restart Norvasc and lisinopril for tomorrow.  4.  Hyperlipidemia unspecified on high-dose Crestor  5.  Anemia.    Iron studies done with iron saturation of 21% suggestive of anemia of chronic disease.  Hemoglobin stable at 8.7 this morning.  DVT prophylaxis; Lovenox  All the records are reviewed and case discussed with Care Management/Social Worker. Management plans discussed with the patient, and she is in agreement.  CODE STATUS: Full Code  TOTAL TIME TAKING CARE OF THIS PATIENT: 32 minutes.   More than 50% of the time was spent in counseling/coordination of care: YES  POSSIBLE D/C IN 2 DAYS, DEPENDING ON CLINICAL CONDITION.   Myka Lukins M.D on 04/13/2019 at 12:54 PM  Between 7am to 6pm - Pager - [phone]  After 6pm go to [email or web address] - Technical brewer [facility]  Office  [phone]  CC: Primary care physician; [name], MD  Note: This dictation was prepared with Dragon dictation along with smaller phrase technology. Any transcriptional errors that  result from this process are unintentional.

## 2019-04-13 NOTE — Progress Notes (Signed)
Physical Therapy Evaluation Patient Details Name: Brianna Dodson MRN: [id number] DOB: December 24, 1933 Today's Date: 04/13/2019   History of Present Illness  Brianna Dodson  is a 85 y.o. female with a known history of stroke with previous hospitalization last month. She presented to the ER with slurred speech.  Clinical Impression  Patient is able to perform bed mobility, transfers and gait independently without AD. She presents with 3+/5 strength BLE hips, 4/5 BLE knees and ankles. She has no static or dynamic sitting or  standing balance deficits. She ambulates 300 feet without AD . She reports no pain and has no skilled PT needs at this time.    Follow Up Recommendations No PT follow up    Equipment Recommendations  None recommended by PT    Recommendations for Other Services       Precautions / Restrictions Restrictions Weight Bearing Restrictions: No      Mobility  Bed Mobility Overal bed mobility: Independent             General bed mobility comments: no assist needed.  Transfers Overall transfer level: Independent               General transfer comment: no assist needed and no AD needed.  Ambulation/Gait Ambulation/Gait assistance: Independent Gait Distance (Feet): 300 Feet Assistive device: None Gait Pattern/deviations: WFL(Within Functional Limits)     General Gait Details: (independent)  Stairs            Wheelchair Mobility    Modified Rankin (Stroke Patients Only)       Balance                                             Pertinent Vitals/Pain Pain Assessment: No/denies pain    Home Living Family/patient expects to be discharged to:: Private residence Living Arrangements: Spouse/significant other Available Help at Discharge: Family Type of Home: House Home Access: Stairs to enter Entrance Stairs-Rails: Right Entrance Stairs-Number of Steps: 2          Prior Function Level of Independence: Independent                Hand Dominance        Extremity/Trunk Assessment   Upper Extremity Assessment Upper Extremity Assessment: Overall WFL for tasks assessed    Lower Extremity Assessment Lower Extremity Assessment: Overall WFL for tasks assessed       Communication   Communication: Expressive difficulties  Cognition Arousal/Alertness: Awake/alert Behavior During Therapy: WFL for tasks assessed/performed Overall Cognitive Status: Within Functional Limits for tasks assessed                                        General Comments      Exercises     Assessment/Plan    PT Assessment Patent does not need any further PT services  PT Problem List         PT Treatment Interventions      PT Goals (Current goals can be found in the Care Plan section)       Frequency     Barriers to discharge        Co-evaluation               AM-PAC PT "6 Clicks" Mobility  Outcome Measure Help  needed turning from your back to your side while in a flat bed without using bedrails?: None Help needed moving from lying on your back to sitting on the side of a flat bed without using bedrails?: None Help needed moving to and from a bed to a chair (including a wheelchair)?: None Help needed standing up from a chair using your arms (e.g., wheelchair or bedside chair)?: None Help needed to walk in hospital room?: None Help needed climbing 3-5 steps with a railing? : None 6 Click Score: 24    End of Session   Activity Tolerance: Patient tolerated treatment well Patient left: in bed;with bed alarm set Nurse Communication: Mobility status PT Visit Diagnosis: Difficulty in walking, not elsewhere classified (R26.2)    Time: 1315-1330 PT Time Calculation (min) (ACUTE ONLY): 15 min   Charges:   PT Evaluation $PT Eval Low Complexity: 1 Low            Marty, Sherryl Barters, PT DPT 04/13/2019, 2:13 PM

## 2019-04-14 ENCOUNTER — Encounter
Admission: EM | Disposition: A | Discharge status: Home Health | Payer: Self-pay | Source: Home / Self Care | Attending: Internal Medicine

## 2019-04-14 ENCOUNTER — Inpatient Hospital Stay
Admit: 2019-04-14 | Discharge: 2019-04-14 | Disposition: A | Discharge status: Home / Self Care | Payer: Medicare Other | Attending: Cardiology | Admitting: Cardiology

## 2019-04-14 DIAGNOSIS — R4701 Aphasia: Secondary | ICD-10-CM

## 2019-04-14 HISTORY — PX: TEE WITHOUT CARDIOVERSION: SHX5443

## 2019-04-14 LAB — BASIC METABOLIC PANEL
Anion gap: 6 (ref 5–15)
BUN: 30 mg/dL — ABNORMAL HIGH (ref 8–23)
CO2: 24 mmol/L (ref 22–32)
Calcium: 8.6 mg/dL — ABNORMAL LOW (ref 8.9–10.3)
Chloride: 107 mmol/L (ref 98–111)
Creatinine, Ser: 1.96 mg/dL — ABNORMAL HIGH (ref 0.44–1.00)
GFR calc Af Amer: 27 mL/min — ABNORMAL LOW (ref >60)
GFR calc non Af Amer: 23 mL/min — ABNORMAL LOW (ref >60)
Glucose, Bld: 261 mg/dL — ABNORMAL HIGH (ref 70–99)
Potassium: 4.4 mmol/L (ref 3.5–5.1)
Sodium: 137 mmol/L (ref 135–145)

## 2019-04-14 LAB — GLUCOSE, CAPILLARY
Glucose-Capillary: 209 mg/dL — ABNORMAL HIGH (ref 70–99)
Glucose-Capillary: 188 mg/dL — ABNORMAL HIGH (ref 70–99)
Glucose-Capillary: 184 mg/dL — ABNORMAL HIGH (ref 70–99)
Glucose-Capillary: 380 mg/dL — ABNORMAL HIGH (ref 70–99)

## 2019-04-14 LAB — MAGNESIUM: Magnesium: 2.2 mg/dL (ref 1.7–2.4)

## 2019-04-14 SURGERY — ECHOCARDIOGRAM, TRANSESOPHAGEAL
Anesthesia: Moderate Sedation

## 2019-04-14 MED ORDER — SODIUM CHLORIDE FLUSH 0.9 % IV SOLN
INTRAVENOUS | Status: AC
  Administered 2019-04-14: 3 mL
  Filled 2019-04-14: qty 10

## 2019-04-14 MED ORDER — FENTANYL CITRATE (PF) 100 MCG/2ML IJ SOLN
INTRAMUSCULAR | Status: DC | PRN
  Administered 2019-04-14: 50 ug via INTRAVENOUS

## 2019-04-14 MED ORDER — SODIUM CHLORIDE 0.9 % IV SOLN
INTRAVENOUS | Status: DC
  Administered 2019-04-14: 15:00:00 via INTRAVENOUS

## 2019-04-14 MED ORDER — FENTANYL CITRATE (PF) 100 MCG/2ML IJ SOLN
INTRAMUSCULAR | Status: AC
  Filled 2019-04-14: qty 2

## 2019-04-14 MED ORDER — AMLODIPINE BESYLATE 10 MG PO TABS
10.0000 mg | ORAL_TABLET | Freq: Every day | ORAL | Status: DC
  Administered 2019-04-15: 10 mg via ORAL
  Filled 2019-04-14: qty 1

## 2019-04-14 MED ORDER — BUTAMBEN-TETRACAINE-BENZOCAINE 2-2-14 % EX AERO
INHALATION_SPRAY | CUTANEOUS | Status: AC
  Administered 2019-04-14: 2
  Filled 2019-04-14: qty 5

## 2019-04-14 MED ORDER — LIDOCAINE VISCOUS HCL 2 % MT SOLN
OROMUCOSAL | Status: AC
  Administered 2019-04-14: 15:00:00
  Filled 2019-04-14: qty 15

## 2019-04-14 MED ORDER — MIDAZOLAM HCL 5 MG/5ML IJ SOLN
INTRAMUSCULAR | Status: AC
  Filled 2019-04-14: qty 5

## 2019-04-14 MED ORDER — MIDAZOLAM HCL 2 MG/2ML IJ SOLN
INTRAMUSCULAR | Status: DC | PRN
  Administered 2019-04-14: 2 mg via INTRAVENOUS

## 2019-04-14 NOTE — Progress Notes (Addendum)
Patient is NPO and her blood sugar this morning is 209. Spoke to Dr. Stark Jock and he stated to hold Novolog order and blood sugar medication this morning and to recheck fasting blood sugar at 1100 a.m.

## 2019-04-14 NOTE — TOC Initial Note (Signed)
Transition of Care [facility]) - Initial/Assessment Note    Patient Details  Name: Brianna Dodson MRN: [id number] Date of Birth: January 12, 1934  [facility] CM/SW Contact:    Shelbie Hutching, RN Phone Number: 04/14/2019, 2:27 PM  Clinical Narrative:                 Patient admitted for slurred speak and MRI positive for acute R infarcts.  Patient is from home and lives with her husband.  Patient is awake and alert, independent at home requiring no assistive devices.  Patient is open with [facility] for RN and PT,  [facility] with [facility] is aware of admission.  Patient will benefit from OT being added to home health services at discharge.  Patient is going for TEE today.  Patient's husband provides transportation, she does not drive.  RNCM will cont to follow.   Expected Discharge Plan: [place] Barriers to Discharge: Continued Medical Work up   Patient Goals and CMS Choice   CMS Medicare.gov Compare Post Acute Care list provided to:: Patient Choice offered to / list presented to : Patient  Expected Discharge Plan and Services Expected Discharge Plan: [place]   Discharge Planning Services: CM Consult Post Acute Care Choice: [place], Resumption of Svcs/PTA Provider Living arrangements for the past 2 months: [place]: [place] ([place]) Date [place]: 04/14/19 Time [place]: [id number] Representative spoke with at [place]: Isac Caddy  Prior Living Arrangements/Services Living arrangements for the past 2 months: Single Family Home Lives with:: Spouse Patient language and need for interpreter reviewed:: No Do you feel safe going back to the place where you live?: Yes      Need for Family Participation in Patient Care: Yes (Comment)(aphasia, acute infarcts) Care giver support system in place?: Yes (comment)(sister and  husband) Current home services: Home PT, Home RN Criminal Activity/Legal Involvement Pertinent to Current Situation/Hospitalization: No - Comment as needed  Activities of Daily Living Home Assistive Devices/Equipment: None ADL Screening (condition at time of admission) Patient's cognitive ability adequate to safely complete daily activities?: Yes Is the patient deaf or have difficulty hearing?: No Does the patient have difficulty seeing, even when wearing glasses/contacts?: No Does the patient have difficulty concentrating, remembering, or making decisions?: No Patient able to express need for assistance with ADLs?: Yes Does the patient have difficulty dressing or bathing?: No Independently performs ADLs?: Yes (appropriate for developmental age) Does the patient have difficulty walking or climbing stairs?: No Weakness of Legs: None Weakness of Arms/Hands: None  Permission Sought/Granted Permission sought to share information with : Case Manager, Family Supports, Other (comment) Permission granted to share information with : Yes, Verbal Permission Granted     Permission granted to share info w AGENCY: [place] granted to share info w Relationship: Sister, husband     Emotional Assessment Appearance:: Appears stated age Attitude/Demeanor/Rapport: Engaged Affect (typically observed): Accepting Orientation: : Oriented to Self, Oriented to Place, Oriented to  Time, Oriented to Situation Alcohol / Substance Use: Not Applicable Psych Involvement: No (comment)  Admission diagnosis:  Aphasia [R47.01] Patient Active Problem List   Diagnosis Date Noted  . Aphasia 04/12/2019  . Stroke (cerebrum) ([place]) 03/15/2019  . Positional numbness and tingling in  both hands, ulnar aspect 09/19/2018  . Contusion of left shoulder 04/11/2018  . Itching 10/23/2017  . Nevus 04/17/2017  . CKD stage 3 due to type 2 diabetes mellitus ([place]) 02/02/2015  . Vitamin D deficiency  02/02/2015  . Obesity (BMI 30-39.9) 08/03/2014  . DM type 2 (diabetes mellitus, type 2) ([place]) 01/29/2014  . Essential hypertension 01/13/2014  . HLD (hyperlipidemia) 01/13/2014   PCP:  [name], MD Pharmacy:   [id number] [place], [place] [place] [address] Phone: [phone] Fax: [phone]     Social Determinants of Health (SDOH) Interventions    Readmission Risk Interventions Readmission Risk Prevention Plan 03/16/2019  Transportation Screening Complete  PCP or Specialist Appt within 5-7 Days Complete  Home Care Screening Complete  Medication Review (RN CM) Complete  Some recent data might be hidden

## 2019-04-14 NOTE — Evaluation (Signed)
Occupational Therapy Evaluation Patient Details Name: Brianna Dodson MRN: [id number] DOB: [birth year] Today's Date: 04/14/2019    History of Present Illness Brianna Dodson  is a 85 y.o. female with a known history of stroke with previous hospitalization last month. Pt presented to the ED with slurred speech. Per neurology note "MRI/MRAhows. Multiple new foci of acute infarction affect the RIGHT hemisphereaffecting the frontal, posterior frontal, posterior temporal, anterior parietal cortex and regional white matter. The linear distribution of the lesions raises the question of watershed type hypoperfusion insult, versus a shower of emboli. Moderate atrophy with extensive small vessel disease. No extracranial flow reducing lesion is evident, within limits for detection on noncontrast MRA neck".   Clinical Impression   Brianna Dodson was seen for OT evaluation this date. Prior to hospital admission, pt was independent in all aspects of ADL mgt and endorses 1 fall in the past 12 months. Pt lives with her spouse and granddaughter in a 2 story home with 2 steps to enter with R hand rail. Currently pt reporting that she feels generally back to her baseline level of function for most ADL mgt. However, with OT assessment she was observed to have decreased [facility] and increased L-sided weakness which will likely impact her ability to perform fine motor activities/ADL mgt. Pt also demonstrates high level balance deficits. She is unable to weight shift while seated EOB to perform heel to shin slides, but is able to complete this activity while supine in bed. No, sensory, cognitive, or visual deficits appreciated with assessment.  Pt would benefit from skilled OT to address noted impairments and functional limitations (see below for any additional details) in order to maximize safety and independence while minimizing falls risk and caregiver burden.  Upon hospital discharge, recommend HHOT to maximize pt safety and return to  functional independence during meaningful occupations of daily life.      Follow Up Recommendations  Home health OT    Equipment Recommendations  3 in 1 bedside commode    Recommendations for Other Services       Precautions / Restrictions Precautions Precautions: Fall Precaution Comments: low fall Restrictions Weight Bearing Restrictions: No Other Position/Activity Restrictions: Pt NPO at time of OT evaluation.      Mobility Bed Mobility Overal bed mobility: Independent             General bed mobility comments: Pt rises easily to EOB with no assist needed.  Transfers Overall transfer level: Modified independent Equipment used: None             General transfer comment: Pt comes to standing at EOB with mild increased time/effort.    Balance Overall balance assessment: Needs assistance Sitting-balance support: Feet supported;Single extremity supported;No upper extremity supported Sitting balance-Leahy Scale: Fair Sitting balance - Comments: Pt demonstrates steady static sitting at EOB with occasional UE support. When attempting weight shift for heel to shin, pt noted to have difficulty maintaining seated balance.   Standing balance support: No upper extremity supported;During functional activity Standing balance-Leahy Scale: Good Standing balance comment: Pt ambulates in room without AD.                           ADL either performed or assessed with clinical judgement   ADL  General ADL Comments: Pt reports symptoms are resolving, and she feels at or near baseline for ADL mgt. Pt demonstrates increased LUE weakness and states that she uses her LUE/RUE equally during her daily routines. Suspect pt will require some assist for mgt of fine motor activities such as buttons/fastoners. Pt currently NPO will continue to assess self feeding abilities if/when pt diet advanced by SLP.     Vision  Baseline Vision/History: Wears glasses Patient Visual Report: No change from baseline Additional Comments: Pt denies visual changes. Will continue to monitor during functional tasks.     Perception     Praxis      Pertinent Vitals/Pain Pain Assessment: No/denies pain     Hand Dominance Right   Extremity/Trunk Assessment Upper Extremity Assessment Upper Extremity Assessment: LUE deficits/detail;RUE deficits/detail RUE Deficits / Details: RUE grossly 4/5 t/o with decreased [place] during opposition. LUE Deficits / Details: LUE grossly 4/5 for elbow flex/ext, & grip; 3+/5 for shoulder flexion with maginal loss of position with resistance. Pt also demonstrates decreased [place] in LUE, she is only able to perform finger to thumb opposition when looking at her hand.   Lower Extremity Assessment Lower Extremity Assessment: Defer to PT evaluation(Pt unable to complete heel to shin with either side this date. Noted generalized weakness with hip flexion and decreased external rotation. Refer to PT evaluaiton.)       Communication Communication Communication: Expressive difficulties   Cognition Arousal/Alertness: Awake/alert Behavior During Therapy: WFL for tasks assessed/performed Overall Cognitive Status: Within Functional Limits for tasks assessed                                 General Comments: Pt A&O x 4. Follows VC's consistently.   General Comments       Exercises Other Exercises Other Exercises: Pt educated in falls prevention strategies and role of occupational therapy in acute care setting.   Shoulder Instructions      Home Living Family/patient expects to be discharged to:: Private residence Living Arrangements: Spouse/significant other;Other relatives(Grddtr) Available Help at Discharge: Family Type of Home: House Home Access: Stairs to enter [employer] of Steps: 2 Entrance Stairs-Rails: Right Home Layout: Two level Alternate Level  Stairs-Number of Steps: 13 Alternate Level Stairs-Rails: Right Bathroom Shower/Tub: Teacher, early years/pre: Standard     Home Equipment: None          Prior Functioning/Environment Level of Independence: Needs assistance  Gait / Transfers Assistance Needed: Pt reports she is independent with ambulation at home. ADL's / Homemaking Assistance Needed: Per pt, dtr and grdtr assist with IADL mgt such as driving and grocery shopping. Pt manages all BADLs independently, I for med mgt, and cooking.            OT Problem List: Decreased strength;Decreased coordination;Impaired UE functional use;Impaired balance (sitting and/or standing);Decreased activity tolerance;Decreased safety awareness;Decreased knowledge of use of DME or AE      OT Treatment/Interventions: Self-care/ADL training;Balance training;Therapeutic exercise;Therapeutic activities;DME and/or AE instruction;Patient/family education    OT Goals(Current goals can be found in the care plan section) Acute Rehab OT Goals Patient Stated Goal: To go home OT Goal Formulation: With patient Time For Goal Achievement: 04/28/19 Potential to Achieve Goals: Good ADL Goals Pt Will Perform Grooming: sitting;with modified independence(With LRAD PRN for improved safety and functional independence) Pt Will Perform Upper Body Bathing: with set-up;with supervision;sitting(With LRAD PRN for improved safety and functional independence) Pt Will  Perform Upper Body Dressing: with modified independence;sitting(With LRAD PRN for improved safety and functional independence)  OT Frequency: Min 1X/week   Barriers to D/C:            Co-evaluation              AM-PAC OT "6 Clicks" Daily Activity     Outcome Measure Help from another person eating meals?: A Little Help from another person taking care of personal grooming?: A Little Help from another person toileting, which includes using toliet, bedpan, or urinal?: A Little Help  from another person bathing (including washing, rinsing, drying)?: A Little Help from another person to put on and taking off regular upper body clothing?: A Little Help from another person to put on and taking off regular lower body clothing?: A Little 6 Click Score: 18   End of Session Equipment Utilized During Treatment: Gait belt  Activity Tolerance: Patient tolerated treatment well;No increased pain Patient left: in bed;with call bell/phone within reach;with bed alarm set  OT Visit Diagnosis: Other abnormalities of gait and mobility (R26.89);Muscle weakness (generalized) (M62.81)                Time: 3838-1840 OT Time Calculation (min): 24 min Charges:  OT General Charges $OT Visit: 1 Visit OT Evaluation $OT Eval Low Complexity: 1 Low OT Treatments $Self Care/Home Management : 8-22 mins  Shara Blazing, M.S., OTR/L Ascom: [phone] 04/14/19, 11:23 AM

## 2019-04-14 NOTE — Progress Notes (Signed)
[facility] [place] NAME: Brianna Dodson    MR#:  [id number]  DATE OF BIRTH:  08/05/1933  SUBJECTIVE:  CHIEF COMPLAINT:   Chief Complaint  Patient presents with  . Aphasia   No new complaint this morning.  Being evaluated for acute CVA.  Patient presented with aphasia and slurred speech.  Scheduled for TEE this afternoon. Updated patient's sister present at bedside and treatment plans.  REVIEW OF SYSTEMS:  ROS CONSTITUTIONAL: No fever, fatigue or weakness.  Positive for weight loss. EYES: No blurred or double vision.  EARS, NOSE, AND THROAT: No tinnitus or ear pain. No sore throat RESPIRATORY: No cough, shortness of breath, wheezing or hemoptysis.  CARDIOVASCULAR: No chest pain, orthopnea, edema.  GASTROINTESTINAL: No nausea, vomiting, diarrhea or abdominal pain. No blood in bowel movements.  Has seen some blood on the toilet paper. GENITOURINARY: No dysuria, hematuria.  ENDOCRINE: No polyuria, nocturia,  HEMATOLOGY: No anemia, easy bruising or bleeding SKIN: No rash or lesion. MUSCULOSKELETAL: No joint pain or arthritis.   NEUROLOGIC: No tingling, numbness, weakness.  PSYCHIATRY: No anxiety or depression.  DRUG ALLERGIES:   Allergies  Allergen Reactions  . Nsaids     CKD stage III - Avoid all nephrotoxic drugs   VITALS:  Blood pressure (!) 157/71, pulse 65, temperature 97.8 F (36.6 C), temperature source Oral, resp. rate 18, height 5\' 5"  (1.651 m), weight 83.6 kg, SpO2 99 %. PHYSICAL EXAMINATION:  Physical Exam  GENERAL:  85 y.o.-year-old patient lying in the bed with no acute distress.  EYES: Pupils equal, round, reactive to light and accommodation. No scleral icterus. Extraocular muscles intact.  HEENT: Head atraumatic, normocephalic. Oropharynx and nasopharynx clear.  NECK:  Supple, no jugular venous distention. No thyroid enlargement, no tenderness.  LUNGS: Normal breath sounds bilaterally, no wheezing, rales,rhonchi or  crepitation. No use of accessory muscles of respiration.  CARDIOVASCULAR: S1, S2 normal. No murmurs, rubs, or gallops.  ABDOMEN: Soft, nontender, nondistended. Bowel sounds present. No organomegaly or mass.  EXTREMITIES: No pedal edema, cyanosis, or clubbing.  NEUROLOGIC: Generalized weakness with no focal deficits.   Gait not checked.  PSYCHIATRIC: The patient is alert and oriented x 3.  SKIN: No rash, lesion, or ulcer.   LABORATORY PANEL:  Female CBC Recent Labs  Lab 04/13/19 0429  WBC 3.9*  HGB 8.7*  HCT 25.5*  PLT 139*   ------------------------------------------------------------------------------------------------------------------ Chemistries  Recent Labs  Lab 04/12/19 1557  04/14/19 0520  NA 135   < > 137  K 4.3   < > 4.4  CL 101   < > 107  CO2 24   < > 24  GLUCOSE 455*   < > 261*  BUN 25*   < > 30*  CREATININE 2.19*   < > 1.96*  CALCIUM 8.9   < > 8.6*  MG  --   --  2.2  AST 25  --   --   ALT 19  --   --   ALKPHOS 56  --   --   BILITOT 0.5  --   --    < > = values in this interval not displayed.   RADIOLOGY:  No results found. ASSESSMENT AND PLAN:   1.  Acute CVA Patient presented with slurred speech and aphasia with starting symptoms over 24 hours prior to admission.  Patient had MRI of the brain done which revealed multiple foci of acute infarction affecting the right hemisphere suggestive of watershed  infarction..  Seen by neurologist.  Recommendation is for TEE.  Patient scheduled for TEE this afternoon.  If TEE is negative patient will need outpatient Holter monitor on discharge.  Decision regarding anticoagulation will be made after TEE done. No extracranial flow reducing lesion is evident, within limits for detection on noncontrast MRA neck.  No evidence of hypotension.  Continue telemetry monitoring.  2.  Type 2 diabetes complicated with chronic kidney disease stage III.  Patient on low-dose Amaryl and will put on sliding scale.  Last A1c 7.4.  3.   Hypertension.  Allowed permissive hypertension in the last several days.   Blood pressure trending up.  Resume home dose of Norvasc.  Monitor  4.  Hyperlipidemia unspecified on high-dose Crestor  5.  Anemia.    Iron studies done with iron saturation of 21% suggestive of anemia of chronic disease.  Hemoglobin stable at 8.7  DVT prophylaxis; Lovenox  All the records are reviewed and case discussed with Care Management/Social Worker. Management plans discussed with the patient, and she is in agreement. Updated sister at bedside and treatment plans and all questions were answered.  CODE STATUS: Full Code  TOTAL TIME TAKING CARE OF THIS PATIENT: 31 minutes.   More than 50% of the time was spent in counseling/coordination of care: YES  POSSIBLE D/C IN 1-2 DAYS, DEPENDING ON CLINICAL CONDITION.   Kire Ferg M.D on 04/14/2019 at 2:13 PM  Between 7am to 6pm - Pager - [phone]  After 6pm go to [email or web address] - [employer] [facility]  Office  [phone]  CC: Primary care physician; [name], MD  Note: This dictation was prepared with Dragon dictation along with smaller phrase technology. Any transcriptional errors that result from this process are unintentional.

## 2019-04-14 NOTE — Progress Notes (Signed)
Subjective: states speech slightly improved.  R watershed infarcts but patient is R handed.    Past Medical History:  Diagnosis Date  . CKD (chronic kidney disease), stage III   . Diabetes mellitus without complication ([place])   . History of blood transfusion   . Hyperlipidemia   . Hypertension   . Stroke [facility])     Past Surgical History:  Procedure Laterality Date  . ABDOMINAL HYSTERECTOMY  1985    Family History  Problem Relation Age of Onset  . Stroke Mother   . Diabetes Mother   . Heart disease Father   . Kidney disease Sister   . Diabetes Sister   . Kidney disease Brother   . Heart disease Sister   . Diabetes Sister   . Diabetes Sister   . Diabetes Sister   . Kidney disease Sister   . Heart disease Sister   . Kidney disease Brother        kidney transplant  . Early death Brother 24       Truck Accident - died  . Heart disease Brother     Social History:  reports that she has never smoked. She has never used smokeless tobacco. She reports that she does not drink alcohol or use drugs.  Allergies  Allergen Reactions  . Nsaids     CKD stage III - Avoid all nephrotoxic drugs    Medications: I have reviewed the patient's current medications.  ROS: As per HPI Physical Examination: Blood pressure (!) 157/71, pulse 64, temperature 97.8 F (36.6 C), temperature source Oral, resp. rate 17, height 5\' 5"  (1.651 m), weight 83.6 kg, SpO2 100 %.    Neurological Examination   Mental Status: Alert, oriented, thought content appropriate.  Periods of aphasia Cranial Nerves: II: Discs flat bilaterally; Visual fields grossly normal, pupils equal, round, reactive to light and accommodation III,IV, VI: ptosis not present, extra-ocular motions intact bilaterally V,VII: smile symmetric, facial light touch sensation normal bilaterally VIII: hearing normal bilaterally IX,X: gag reflex present XI: bilateral shoulder shrug XII: midline tongue extension Motor: Right : Upper  extremity   5/5    Left:     Upper extremity   5/5  Lower extremity   5/5     Lower extremity   5/5 Tone and bulk:normal tone throughout; no atrophy noted Sensory: Pinprick and light touch intact throughout, bilaterally Deep Tendon Reflexes: 1+ and symmetric throughout Plantars: Right: downgoing   Left: downgoing Cerebellar: normal finger-to-nose, normal rapid alternating movements and normal heel-to-shin test Gait: not tested      Results for orders placed or performed during the hospital encounter of 04/12/19 (from the past 48 hour(s))  Glucose, capillary     Status: Abnormal   Collection Time: 04/12/19  3:51 PM  Result Value Ref Range   Glucose-Capillary 445 (H) 70 - 99 mg/dL  Comprehensive metabolic panel     Status: Abnormal   Collection Time: 04/12/19  3:57 PM  Result Value Ref Range   Sodium 135 135 - 145 mmol/L   Potassium 4.3 3.5 - 5.1 mmol/L   Chloride 101 98 - 111 mmol/L   CO2 24 22 - 32 mmol/L   Glucose, Bld 455 (H) 70 - 99 mg/dL   BUN 25 (H) 8 - 23 mg/dL   Creatinine, Ser 2.19 (H) 0.44 - 1.00 mg/dL   Calcium 8.9 8.9 - 10.3 mg/dL   Total Protein 8.0 6.5 - 8.1 g/dL   Albumin 4.0 3.5 - 5.0 g/dL   AST  25 15 - 41 U/L   ALT 19 0 - 44 U/L   Alkaline Phosphatase 56 38 - 126 U/L   Total Bilirubin 0.5 0.3 - 1.2 mg/dL   GFR calc non Af Amer 20 (L) >60 mL/min   GFR calc Af Amer 23 (L) >60 mL/min   Anion gap 10 5 - 15    Comment: Performed at [facility], [address] [place], [place] [address]  CBC with Differential     Status: Abnormal   Collection Time: 04/12/19  3:57 PM  Result Value Ref Range   WBC 4.8 4.0 - 10.5 K/uL   RBC 3.45 (L) 3.87 - 5.11 MIL/uL   Hemoglobin 9.8 (L) 12.0 - 15.0 g/dL   HCT 29.0 (L) 36.0 - 46.0 %   MCV 84.1 80.0 - 100.0 fL   MCH 28.4 26.0 - 34.0 pg   MCHC 33.8 30.0 - 36.0 g/dL   RDW 12.2 11.5 - 15.5 %   Platelets 163 150 - 400 K/uL   nRBC 0.0 0.0 - 0.2 %   Neutrophils Relative % 63 %   Neutro Abs 3.0 1.7 - 7.7 K/uL    Lymphocytes Relative 25 %   Lymphs Abs 1.2 0.7 - 4.0 K/uL   Monocytes Relative 8 %   Monocytes Absolute 0.4 0.1 - 1.0 K/uL   Eosinophils Relative 3 %   Eosinophils Absolute 0.2 0.0 - 0.5 K/uL   Basophils Relative 1 %   Basophils Absolute 0.0 0.0 - 0.1 K/uL   Immature Granulocytes 0 %   Abs Immature Granulocytes 0.02 0.00 - 0.07 K/uL    Comment: Performed at [facility], [place], [place] [address]  Troponin I (High Sensitivity)     Status: None   Collection Time: 04/12/19  3:57 PM  Result Value Ref Range   Troponin I (High Sensitivity) 6 <18 ng/L    Comment: (NOTE) Elevated high sensitivity troponin I (hsTnI) values and significant  changes across serial measurements may suggest ACS but many other  chronic and acute conditions are known to elevate hsTnI results.  Refer to the "Links" section for chest pain algorithms and additional  guidance. Performed at [facility], [address] [place], [place] [address]   Ferritin     Status: None   Collection Time: 04/12/19  3:57 PM  Result Value Ref Range   Ferritin 105 11 - 307 ng/mL    Comment: Performed at [facility], [address] [place], [place] [address]  Iron and TIBC     Status: None   Collection Time: 04/12/19  3:57 PM  Result Value Ref Range   Iron 57 28 - 170 ug/dL   TIBC 266 250 - 450 ug/dL   Saturation Ratios 21 10.4 - 31.8 %   UIBC 209 ug/dL    Comment: Performed at [facility], [address] [place], [place] [address]  Hemoglobin A1c     Status: Abnormal   Collection Time: 04/12/19  3:57 PM  Result Value Ref Range   Hgb A1c MFr Bld 8.8 (H) 4.8 - 5.6 %    Comment: (NOTE) Pre diabetes:          5.7%-6.4% Diabetes:              >6.4% Glycemic control for   <7.0% adults with diabetes    Mean Plasma Glucose 205.86 mg/dL    Comment: Performed at [facility] [address] [place], [place]  [address]  Troponin I (High Sensitivity)     Status:  None   Collection Time: 04/12/19  7:59 PM  Result Value Ref Range   Troponin I (High Sensitivity) 6 <18 ng/L    Comment: (NOTE) Elevated high sensitivity troponin I (hsTnI) values and significant  changes across serial measurements may suggest ACS but many other  chronic and acute conditions are known to elevate hsTnI results.  Refer to the "Links" section for chest pain algorithms and additional  guidance. Performed at [facility], [place], [place] [address]   SARS CORONAVIRUS 2 (TAT 6-24 HRS) Nasopharyngeal Nasopharyngeal Swab     Status: None   Collection Time: 04/12/19  7:59 PM   Specimen: Nasopharyngeal Swab  Result Value Ref Range   SARS Coronavirus 2 NEGATIVE NEGATIVE    Comment: (NOTE) SARS-CoV-2 target nucleic acids are NOT DETECTED. The SARS-CoV-2 RNA is generally detectable in upper and lower respiratory specimens during the acute phase of infection. Negative results do not preclude SARS-CoV-2 infection, do not rule out co-infections with other pathogens, and should not be used as the sole basis for treatment or other patient management decisions. Negative results must be combined with clinical observations, patient history, and epidemiological information. The expected result is Negative. Fact Sheet for Patients: SugarRoll.be Fact Sheet for Healthcare Providers: https://www.woods-mathews.com/ This test is not yet approved or cleared by the [place] FDA and  has been authorized for detection and/or diagnosis of SARS-CoV-2 by FDA under an Emergency Use Authorization (EUA). This EUA will remain  in effect (meaning this test can be used) for the duration of the COVID-19 declaration under Section 56 4(b)(1) of the Act, 21 U.S.C. section 360bbb-3(b)(1), unless the authorization is terminated or revoked sooner. Performed at [facility], [address], [place],  [address]   Glucose,  capillary     Status: Abnormal   Collection Time: 04/12/19  8:53 PM  Result Value Ref Range   Glucose-Capillary 196 (H) 70 - 99 mg/dL  Lipid panel     Status: Abnormal   Collection Time: 04/13/19  4:29 AM  Result Value Ref Range   Cholesterol 115 0 - 200 mg/dL   Triglycerides 139 <150 mg/dL   HDL 35 (L) >40 mg/dL   Total CHOL/HDL Ratio 3.3 RATIO   VLDL 28 0 - 40 mg/dL   LDL Cholesterol 52 0 - 99 mg/dL    Comment:        Total Cholesterol/HDL:CHD Risk Coronary Heart Disease Risk Table                     Men   Women  1/2 Average Risk   3.4   3.3  Average Risk       5.0   4.4  2 X Average Risk   9.6   7.1  3 X Average Risk  23.4   11.0        Use the calculated Patient Ratio above and the CHD Risk Table to determine the patient's CHD Risk.        ATP III CLASSIFICATION (LDL):  <100     mg/dL   Optimal  100-129  mg/dL   Near or Above                    Optimal  130-159  mg/dL   Borderline  160-189  mg/dL   High  >190     mg/dL   Very High Performed  at [facility], [address] [place], [place] [address]   CBC     Status: Abnormal   Collection Time: 04/13/19  4:29 AM  Result Value Ref Range   WBC 3.9 (L) 4.0 - 10.5 K/uL   RBC 3.01 (L) 3.87 - 5.11 MIL/uL   Hemoglobin 8.7 (L) 12.0 - 15.0 g/dL   HCT 25.5 (L) 36.0 - 46.0 %   MCV 84.7 80.0 - 100.0 fL   MCH 28.9 26.0 - 34.0 pg   MCHC 34.1 30.0 - 36.0 g/dL   RDW 12.1 11.5 - 15.5 %   Platelets 139 (L) 150 - 400 K/uL   nRBC 0.0 0.0 - 0.2 %    Comment: Performed at [facility], [address] [place], [place] [address]  Basic metabolic panel     Status: Abnormal   Collection Time: 04/13/19  4:29 AM  Result Value Ref Range   Sodium 138 135 - 145 mmol/L   Potassium 4.4 3.5 - 5.1 mmol/L   Chloride 106 98 - 111 mmol/L   CO2 24 22 - 32 mmol/L   Glucose, Bld 269 (H) 70 - 99 mg/dL   BUN 23 8 - 23 mg/dL   Creatinine, Ser 1.95 (H) 0.44 - 1.00 mg/dL   Calcium 8.5 (L) 8.9 - 10.3 mg/dL   GFR calc non Af  Amer 23 (L) >60 mL/min   GFR calc Af Amer 27 (L) >60 mL/min   Anion gap 8 5 - 15    Comment: Performed at [facility], [address] [place], [place] [address]  Glucose, capillary     Status: Abnormal   Collection Time: 04/13/19  9:08 AM  Result Value Ref Range   Glucose-Capillary 216 (H) 70 - 99 mg/dL  Glucose, capillary     Status: Abnormal   Collection Time: 04/13/19 12:21 PM  Result Value Ref Range   Glucose-Capillary 243 (H) 70 - 99 mg/dL   Comment 1 Notify RN   Glucose, capillary     Status: Abnormal   Collection Time: 04/13/19  5:06 PM  Result Value Ref Range   Glucose-Capillary 126 (H) 70 - 99 mg/dL   Comment 1 Notify RN   Glucose, capillary     Status: Abnormal   Collection Time: 04/13/19  8:53 PM  Result Value Ref Range   Glucose-Capillary 147 (H) 70 - 99 mg/dL  Basic metabolic panel     Status: Abnormal   Collection Time: 04/14/19  5:20 AM  Result Value Ref Range   Sodium 137 135 - 145 mmol/L   Potassium 4.4 3.5 - 5.1 mmol/L   Chloride 107 98 - 111 mmol/L   CO2 24 22 - 32 mmol/L   Glucose, Bld 261 (H) 70 - 99 mg/dL   BUN 30 (H) 8 - 23 mg/dL   Creatinine, Ser 1.96 (H) 0.44 - 1.00 mg/dL   Calcium 8.6 (L) 8.9 - 10.3 mg/dL   GFR calc non Af Amer 23 (L) >60 mL/min   GFR calc Af Amer 27 (L) >60 mL/min   Anion gap 6 5 - 15    Comment: Performed at [facility], [address] [place], [place] [address]  Magnesium     Status: None   Collection Time: 04/14/19  5:20 AM  Result Value Ref Range   Magnesium 2.2 1.7 - 2.4 mg/dL    Comment: Performed at [facility], [address] [place], [place] [address]  Glucose, capillary     Status:  Abnormal   Collection Time: 04/14/19  8:07 AM  Result Value Ref Range   Glucose-Capillary 209 (H) 70 - 99 mg/dL    Recent Results (from the past 240 hour(s))  SARS CORONAVIRUS 2 (TAT 6-24 HRS) Nasopharyngeal Nasopharyngeal Swab     Status: None   Collection Time: 04/12/19  7:59 PM   Specimen:  Nasopharyngeal Swab  Result Value Ref Range Status   SARS Coronavirus 2 NEGATIVE NEGATIVE Final    Comment: (NOTE) SARS-CoV-2 target nucleic acids are NOT DETECTED. The SARS-CoV-2 RNA is generally detectable in upper and lower respiratory specimens during the acute phase of infection. Negative results do not preclude SARS-CoV-2 infection, do not rule out co-infections with other pathogens, and should not be used as the sole basis for treatment or other patient management decisions. Negative results must be combined with clinical observations, patient history, and epidemiological information. The expected result is Negative. Fact Sheet for Patients: SugarRoll.be Fact Sheet for Healthcare Providers: https://www.woods-mathews.com/ This test is not yet approved or cleared by the [place] FDA and  has been authorized for detection and/or diagnosis of SARS-CoV-2 by FDA under an Emergency Use Authorization (EUA). This EUA will remain  in effect (meaning this test can be used) for the duration of the COVID-19 declaration under Section 56 4(b)(1) of the Act, 21 U.S.C. section 360bbb-3(b)(1), unless the authorization is terminated or revoked sooner. Performed at [facility], [place] [address] [place], [place] [address]     Ct Head Wo Contrast  Result Date: 04/12/2019 CLINICAL DATA:  Speech difficulty since yesterday evening. EXAM: CT HEAD WITHOUT CONTRAST TECHNIQUE: Contiguous axial images were obtained from the base of the skull through the vertex without intravenous contrast. COMPARISON:  CT head and MR brain dated March 15, 2019. FINDINGS: Brain: No evidence of acute infarction, hemorrhage, hydrocephalus, extra-axial collection or mass lesion/mass effect. Stable mild atrophy and moderate chronic microvascular ischemic changes. Vascular: Calcified atherosclerosis at the skullbase. No hyperdense vessel. Skull: Normal. Negative for fracture  or focal lesion. Sinuses/Orbits: No acute finding. Other: None. IMPRESSION: 1.  No acute intracranial abnormality. 2. Stable mild atrophy and moderate chronic microvascular ischemic changes. Electronically Signed   By: Titus Dubin M.D.   On: 04/12/2019 16:37   Mr Angio Neck Wo Contrast  Result Date: 04/12/2019 CLINICAL DATA:  Speech difficulty began yesterday. Dysarthria and expressive aphasia. EXAM: MRI HEAD WITHOUT CONTRAST MRA NECK WITHOUT CONTRAST TECHNIQUE: Multiplanar, multiecho pulse sequences of the brain and surrounding structures were obtained without intravenous contrast. Angiographic images of the neck were obtained using MRA technique without intravenous contrast. Carotid stenosis measurements (when applicable) are obtained utilizing NASCET criteria, using the distal internal carotid diameter as the denominator. CONTRAST:  None. COMPARISON:  CT head earlier in the day.  MR brain 03/15/2019 FINDINGS: MRI HEAD FINDINGS Brain: Multiple subcentimeter foci of restricted diffusion, corresponding low ADC, T2 and FLAIR hyperintense, affect the RIGHT hemisphere involving the frontal, posterior frontal, posterior temporal, and anterior parietal cortex and regional white matter. The linear distribution of the subcortical and periventricular white matter lesions raises the question of watershed type hypoperfusion insult, versus a shower of emboli within the RIGHT anterior circulation. Of note, the same hemisphere was affected with multiple focal and confluent areas of infarction on the September 2020 MR scan but today's areas are clearly new and reflect involvement in different locations. No hemorrhage, mass lesion, hydrocephalus, or extra-axial fluid. Moderate cerebral and cerebellar atrophy. Extensive T2 and FLAIR hyperintensities throughout the periventricular and subcortical  white matter, consistent with small vessel disease. Scattered areas of chronic lacunar infarction affect the deep nuclei and  BILATERAL subcortical white matter. Vascular: Flow voids are maintained. Small foci of susceptibility are redemonstrated in the RIGHT occipital lobe. Skull and upper cervical spine: Normal marrow signal. The pituitary is mildly prominent for age, but there is no suprasellar extension. Sinuses/Orbits: No acute findings. Other: None. MRA NECK FINDINGS Noncontrast examination is limited to examination the carotid arteries above the arch. The study demonstrates tortuous but widely patent common carotid arteries, internal carotid arteries, and external carotid arteries. No visible stenosis or dissection at the bifurcation. BILATERAL vertebral arteries are assessed primarily in their V2 segments. These arteries are tortuous but widely patent. IMPRESSION: 1. Multiple new foci of acute infarction affect the RIGHT hemisphere affecting the frontal, posterior frontal, posterior temporal, anterior parietal cortex and regional white matter. The linear distribution of the lesions raises the question of watershed type hypoperfusion insult, versus a shower of emboli. 2. Moderate atrophy with extensive small vessel disease. 3. No extracranial flow reducing lesion is evident, within limits for detection on noncontrast MRA neck. Electronically Signed   By: Staci Righter M.D.   On: 04/12/2019 20:33   Mr Brain Wo Contrast  Result Date: 04/12/2019 CLINICAL DATA:  Speech difficulty began yesterday. Dysarthria and expressive aphasia. EXAM: MRI HEAD WITHOUT CONTRAST MRA NECK WITHOUT CONTRAST TECHNIQUE: Multiplanar, multiecho pulse sequences of the brain and surrounding structures were obtained without intravenous contrast. Angiographic images of the neck were obtained using MRA technique without intravenous contrast. Carotid stenosis measurements (when applicable) are obtained utilizing NASCET criteria, using the distal internal carotid diameter as the denominator. CONTRAST:  None. COMPARISON:  CT head earlier in the day.  MR brain  03/15/2019 FINDINGS: MRI HEAD FINDINGS Brain: Multiple subcentimeter foci of restricted diffusion, corresponding low ADC, T2 and FLAIR hyperintense, affect the RIGHT hemisphere involving the frontal, posterior frontal, posterior temporal, and anterior parietal cortex and regional white matter. The linear distribution of the subcortical and periventricular white matter lesions raises the question of watershed type hypoperfusion insult, versus a shower of emboli within the RIGHT anterior circulation. Of note, the same hemisphere was affected with multiple focal and confluent areas of infarction on the September 2020 MR scan but today's areas are clearly new and reflect involvement in different locations. No hemorrhage, mass lesion, hydrocephalus, or extra-axial fluid. Moderate cerebral and cerebellar atrophy. Extensive T2 and FLAIR hyperintensities throughout the periventricular and subcortical white matter, consistent with small vessel disease. Scattered areas of chronic lacunar infarction affect the deep nuclei and BILATERAL subcortical white matter. Vascular: Flow voids are maintained. Small foci of susceptibility are redemonstrated in the RIGHT occipital lobe. Skull and upper cervical spine: Normal marrow signal. The pituitary is mildly prominent for age, but there is no suprasellar extension. Sinuses/Orbits: No acute findings. Other: None. MRA NECK FINDINGS Noncontrast examination is limited to examination the carotid arteries above the arch. The study demonstrates tortuous but widely patent common carotid arteries, internal carotid arteries, and external carotid arteries. No visible stenosis or dissection at the bifurcation. BILATERAL vertebral arteries are assessed primarily in their V2 segments. These arteries are tortuous but widely patent. IMPRESSION: 1. Multiple new foci of acute infarction affect the RIGHT hemisphere affecting the frontal, posterior frontal, posterior temporal, anterior parietal cortex and  regional white matter. The linear distribution of the lesions raises the question of watershed type hypoperfusion insult, versus a shower of emboli. 2. Moderate atrophy with extensive small vessel disease. 3. No  extracranial flow reducing lesion is evident, within limits for detection on noncontrast MRA neck. Electronically Signed   By: Staci Righter M.D.   On: 04/12/2019 20:33     Assessment/Plan: 85 y/o with a history of HTN, DM,  HLD, recent admission for right sided stroke on 03/2019 on ASA/Plavix admitted with slurred speech and trouble finding words  In whom euro exam expressive aphasia and slight dysarthria,  MRI/MRA hows  Multiple new foci of acute infarction affect the RIGHThemisphereaffecting the frontal, posterior frontal, posterior temporal,anterior parietal cortex and regional white matter. The lineardistribution of the lesions raises the question of watershed type hypoperfusion insult, versus a shower of emboli.Moderate atrophy with extensive small vessel disease.No extracranial flow reducing lesion is evident, within limits for detection on noncontrast MRA neck.    RECS: - Neuro protective measures including normothermia, normoglycemia, correct electrolytes/metabolic abnlites, treat any infection while admitted - Permissive BHP for now - Consider TEE and if negative would place on out patient holter monitor.   - Strokes likely in setting of brief period of hypotension - no current indication for a blood thinner - Continue ASA/Plavix.   04/14/2019, 10:28 AM

## 2019-04-14 NOTE — CV Procedure (Signed)
   TRANSESOPHAGEAL ECHOCARDIOGRAM   NAME:  Brianna Dodson   MRN: [id number] DOB:  06-Jan-1934   ADMIT DATE: 04/12/2019  INDICATIONS:   PROCEDURE:   Informed consent was obtained prior to the procedure. The risks, benefits and alternatives for the procedure were discussed and the patient comprehended these risks.  Risks include, but are not limited to, cough, sore throat, vomiting, nausea, somnolence, esophageal and stomach trauma or perforation, bleeding, low blood pressure, aspiration, pneumonia, infection, trauma to the teeth and death.    After a procedural time-out, the patient was given 2 mg versed and 50 mcg fentanyl to achieve moderate sedation for 17 min.  The oropharynx was anesthetized 3 cc of topical 1% viscous lidocaine.  The transesophageal probe was inserted in the esophagus and stomach without difficulty and multiple views were obtained. I was present for the entire procedure.    COMPLICATIONS:    There were no immediate complications.  FINDINGS:  LEFT VENTRICLE: EF = 55%. No regional wall motion abnormalities.  RIGHT VENTRICLE: Normal size and function.   LEFT ATRIUM: NOrmal size. No thrombus noted  LEFT ATRIAL APPENDAGE: No thrombus. Good doppler flow. No mass or thrombus  RIGHT ATRIUM: Normal. No thrombus  AORTIC VALVE:  Trileaflet. Trivial ai  MITRAL VALVE:    Normal. Trivial to mild mr  TRICUSPID VALVE: Normal. Mild tr  PULMONIC VALVE: Grossly normal.  INTERATRIAL SEPTUM: No PFO or ASD. Agitated saline contrast was used.   PERICARDIUM: No effusion  DESCENDING AORTA:   CONCLUSION: No evidence of cardiac source of emboli.

## 2019-04-14 NOTE — Progress Notes (Signed)
Patient is still NPO for TEE test, she is going around 1300. Her 11a.m. blood sugar was 188. Spoke to Dr. Stark Jock he said again to hold her Novolog.

## 2019-04-14 NOTE — Progress Notes (Signed)
Chart reviewed. Pt visited. Family member present. Pt upset that she has not gone for her TEE yet and has not been able to eat or drink. NPO until after testing. Pt with word retrieval difficulties evident during our short conversation. Pt stating she did not want to participate with any speech eval or screening until her testing was completed. ST to visit tomorrow for cognitive evaluation. Family requesting HH ST at discharge. Pt reports no dysphagia.

## 2019-04-14 NOTE — Progress Notes (Signed)
*  PRELIMINARY RESULTS* Echocardiogram Echocardiogram Transesophageal has been performed.  Sherrie Sport 04/14/2019, 3:15 PM

## 2019-04-15 ENCOUNTER — Encounter: Payer: Self-pay | Admitting: Cardiology

## 2019-04-15 DIAGNOSIS — I6310 Cerebral infarction due to embolism of unspecified precerebral artery: Secondary | ICD-10-CM

## 2019-04-15 LAB — GLUCOSE, CAPILLARY
Glucose-Capillary: 245 mg/dL — ABNORMAL HIGH (ref 70–99)
Glucose-Capillary: 389 mg/dL — ABNORMAL HIGH (ref 70–99)
Glucose-Capillary: 366 mg/dL — ABNORMAL HIGH (ref 70–99)
Glucose-Capillary: 188 mg/dL — ABNORMAL HIGH (ref 70–99)

## 2019-04-15 MED ORDER — INSULIN ASPART 100 UNIT/ML IV SOLN: 10.0000 [IU] | Freq: Once | INTRAVENOUS | Status: DC

## 2019-04-15 MED ORDER — INSULIN ASPART 100 UNIT/ML ~~LOC~~ SOLN
10.0000 [IU] | Freq: Once | SUBCUTANEOUS | Status: AC
  Administered 2019-04-15: 15:00:00 10 [IU] via SUBCUTANEOUS
  Filled 2019-04-15: qty 1

## 2019-04-15 NOTE — Progress Notes (Signed)
Inpatient Diabetes Program Recommendations  AACE/ADA: New Consensus Statement on Inpatient Glycemic Control   Target Ranges:  Prepandial:   less than 140 mg/dL      Peak postprandial:   less than 180 mg/dL (1-2 hours)      Critically ill patients:  140 - 180 mg/dL   Results for ASHANI, PUMPHREY (MRN [id number]) as of 04/15/2019 10:23  Ref. Range 04/14/2019 08:07 04/14/2019 11:30 04/14/2019 16:53 04/14/2019 21:06 04/15/2019 07:35  Glucose-Capillary Latest Ref Range: 70 - 99 mg/dL 209 (H) 188 (H) 184 (H) 380 (H) 245 (H)   Review of Glycemic Control  Diabetes history: DM2 Outpatient Diabetes medications: Amaryl 2 mg QAM Current orders for Inpatient glycemic control: Amaryl 2 mg QAM, Novolog 0-9 units TID with meals, Novolog 0-5 units QHS  Inpatient Diabetes Program Recommendations:   Oral DM medication: Noted Amaryl was not given yesterday due to patient being NPO (per MD order to hold) for procedure. As a result, glucose more elevated yesterday evening and fasting glucose 245 mg/dl today. Patient has already received Amaryl today. Anticipate glucose will improve with Amaryl.  Thanks, Barnie Alderman, RN, MSN, CDE Diabetes Coordinator Inpatient Diabetes Program [phone] (Team Pager from 8am to 5pm)

## 2019-04-15 NOTE — Progress Notes (Addendum)
Rechecked patients blood sugar and it was 188. Dr. Stark Jock said it was okay to discharge. IV removed.

## 2019-04-15 NOTE — Evaluation (Signed)
Speech Language Pathology Evaluation Patient Details Name: Brianna Dodson MRN: [id number] DOB: December 22, 1933 Today's Date: 04/15/2019 Time: 7124-5809 SLP Time Calculation (min) (ACUTE ONLY): 52 min  Problem List:  Patient Active Problem List   Diagnosis Date Noted  . Aphasia 04/12/2019  . Stroke (cerebrum) ([place]) 03/15/2019  . Positional numbness and tingling in both hands, ulnar aspect 09/19/2018  . Contusion of left shoulder 04/11/2018  . Itching 10/23/2017  . Nevus 04/17/2017  . CKD stage 3 due to type 2 diabetes mellitus (Brianna Dodson) 02/02/2015  . Vitamin D deficiency 02/02/2015  . Obesity (BMI 30-39.9) 08/03/2014  . DM type 2 (diabetes mellitus, type 2) (Brianna Dodson) 01/29/2014  . Essential hypertension 01/13/2014  . HLD (hyperlipidemia) 01/13/2014   Past Medical History:  Past Medical History:  Diagnosis Date  . CKD (chronic kidney disease), stage III   . Diabetes mellitus without complication (Brianna Dodson)   . History of blood transfusion   . Hyperlipidemia   . Hypertension   . Stroke [facility])    Past Surgical History:  Past Surgical History:  Procedure Laterality Date  . ABDOMINAL HYSTERECTOMY  1985  . TEE WITHOUT CARDIOVERSION N/A 04/14/2019   Procedure: TRANSESOPHAGEAL ECHOCARDIOGRAM (TEE);  Surgeon: Brianna Spray, MD;  Location: [facility] ORS;  Service: Cardiovascular;  Laterality: N/A;   HPI:  Pt is a 85 y.o. female with a known history of stroke(with previous hospitalization last month), HTN, DM, CKD, hyperlipidemia.  Her MRI at that time showed scattered patchy acute infarcts in the right MCA territory in the right PCA territory.  Patient was treated at that time with aspirin and Plavix and cholesterol medication.  She presents to the hospital with symptoms of slurred speech(which she stated began last Friday) but does not complain of any weakness one side versus the other.  Current MRI revealed: "Multiple new foci of acute infarction affect the RIGHT hemisphere affecting the frontal, posterior  frontal, posterior temporal, anterior parietal cortex and regional white matter; Moderate Atrophy".      Assessment / Plan / Recommendation Clinical Impression  Pt appears to present w/ Motor Speech deficits c/b Dysarthria and Dysfluency which impacts speech intellgibility at word-conversation levels. When pt utilizes strategies including Slowing Rate, Increaseding Volume, and Overarticulation, she increased intelligibility significantly. Pt's speech pattern is also c/b hesitations and choppy quality w/ decreased breath support at end of short phrases. Pt was also educated on pausing while slowing her rate and replenishing her breath support -- cut longer sentences into shorter sentences. During Auditory Comprehension and Cognitive-linguistic tasks and questions, no gross deficits notes were noted during this bedside assessment. Reading at lengthy sentence level [facility]. Pt conversed w/ appropriate thought and content about issues surrounding Political events being shown on TV upon entering room this morning. She was aware of her speech difficulties and stated she had been "practicing reading to make my speech better last night". Writing tasks were not completed during this session.  Discussed w/ pt strategies and handouts on such to improve Articulation of speech; Intelligibility of speech. Encouraged her to practice suggestions on handouts until scheduled for f/u skilled ST services at Discharge. Pt would benefit from skilled ST services at discharge in order to improve speech-communication in conversation w/ others as well as during ADLs.     SLP Assessment  SLP Recommendation/Assessment: All further Speech Lanaguage Pathology  needs can be addressed in the next venue of care SLP Visit Diagnosis: Dysarthria and anarthria (R47.1)(Dysfluency)    Follow Up Recommendations  Home health SLP  Frequency and Duration (TBD)  (TBD)      SLP Evaluation Cognition  Overall Cognitive Status: Within Functional  Limits for tasks assessed Arousal/Alertness: Awake/alert Orientation Level: Oriented X4 Attention: Focused;Sustained Focused Attention: Appears intact Sustained Attention: Appears intact Memory: Appears intact Awareness: Appears intact Problem Solving: Appears intact Executive Function: Reasoning;Decision Making Reasoning: Appears intact Decision Making: Appears intact Behaviors: (n/a) Safety/Judgment: Appears intact       Comprehension  Auditory Comprehension Overall Auditory Comprehension: Appears within functional limits for tasks assessed Yes/No Questions: Within Functional Limits Commands: Within Functional Limits Conversation: Complex Other Conversation Comments: re: discussion of committee hearing/govt on tv Interfering Components: Motor planning(dysfluency) EffectiveTechniques: Slowed speech;Stressing words;Increased volume Visual Recognition/Discrimination Discrimination: Not tested Reading Comprehension Reading Status: Within funtional limits    Expression Expression Primary Mode of Expression: Verbal Verbal Expression Overall Verbal Expression: Appears within functional limits for tasks assessed Initiation: No impairment Automatic Speech: Name;Social Response;Counting;Day of week Level of Generative/Spontaneous Verbalization: Phrase;Sentence Repetition: No impairment Naming: No impairment Pragmatics: No impairment Interfering Components: Speech intelligibility Effective Techniques: Articulatory cues Non-Verbal Means of Communication: Not applicable Written Expression Dominant Hand: Right Written Expression: Not tested   Oral / Motor  Oral Motor/Sensory Function Overall Oral Motor/Sensory Function: Mild impairment Lingual ROM: Within Functional Limits(in single movements; not consecutive ) Lingual Symmetry: Within Functional Limits Lingual Strength: Within Functional Limits Velum: Within Functional Limits Mandible: Within Functional Limits Motor  Speech Overall Motor Speech: Impaired Respiration: Impaired(loses breath support) Level of Impairment: Phrase Phonation: Normal Resonance: Within functional limits Articulation: Impaired Level of Impairment: Word Intelligibility: Intelligibility reduced Word: 50-74% accurate Phrase: 50-74% accurate Sentence: 25-49% accurate Conversation: 25-49% accurate Motor Planning: Impaired(hesitations, choppy) Level of Impairment: Phrase Motor Speech Errors: Aware Interfering Components: Inadequate dentition(wears upper plate; missing few lower) Effective Techniques: Slow rate;Increased vocal intensity;Over-articulate   GO                      Orinda Kenner, MS, CCC-SLP Andrzej Scully 04/15/2019, 1:14 PM

## 2019-04-15 NOTE — Care Management Important Message (Signed)
Important Message  Patient Details  Name: Brianna Dodson MRN: [id number] Date of Birth: 05-05-1934   Medicare Important Message Given:  Yes     Dannette Barbara 04/15/2019, 10:49 AM

## 2019-04-15 NOTE — Discharge Summary (Signed)
[facility] [place] NAME: Brianna Dodson    MR#:  [id number]  DATE OF BIRTH:  21-Sep-1933  DATE OF ADMISSION:  04/12/2019   ADMITTING PHYSICIAN: Loletha Grayer, MD  DATE OF DISCHARGE: 04/15/2019  PRIMARY CARE PHYSICIAN: [name], MD   ADMISSION DIAGNOSIS:  Aphasia [R47.01] DISCHARGE DIAGNOSIS:  Active Problems:   Aphasia  SECONDARY DIAGNOSIS:   Past Medical History:  Diagnosis Date  . CKD (chronic kidney disease), stage III   . Diabetes mellitus without complication ([place])   . History of blood transfusion   . Hyperlipidemia   . Hypertension   . Stroke [facility])    HOSPITAL COURSE:  Chief complaint; slurred speech  History of presenting complaint; Brianna Dodson  is a 85 y.o. female with a known history of stroke with previous hospitalization last month.  Her MRI at that time showed scattered patchy acute infarcts in the right MCA territory in the right PCA territory.  Patient was treated at that time with aspirin and Plavix and cholesterol medication.  She presented to the hospital with symptoms of slurred speech which have been going on since 1 day prior to admission.  Patient denied any weakness.  Was admitted to medical service for further evaluation and management.    Hospital course; 1.  Acute CVA Patient presented with slurred speech and aphasia with starting symptoms over 24 hours prior to admission. Patient had MRI of the brain done which revealed multiple foci of acute infarction affecting the right hemisphere suggestive of watershed infarction..  Seen by neurologist.  Patient subsequently had TEE done by Dr. Ubaldo Glassing during this admission which was negative for thrombus.  Appointment made to follow-up with cardiologist Dr. Ubaldo Glassing to set up outpatient Holter monitor.No extracranial flow reducing lesion is evident, within limits for detection on noncontrast MRA neck.  No evidence of hypotension.    Patient ambulated very well  with physical therapy.  No further physical therapy needs recommended.  Patient tolerating diet will with no difficulty with swallowing.  Clinically and hemodynamically stable for discharge today.  2. Type 2 diabetes complicated with chronic kidney disease stage III. Patient on low-dose Amaryl.  Continue to same on discharge.  Follow-up with primary care physician for monitoring of blood sugars. Last A1c 7.4.  3. Hypertension. Allowed permissive hypertension in the last several days.   Blood pressure trending up.  Resumed home dose of Norvasc.    Follow-up with primary care physician for ongoing monitoring of blood pressures. 4. Hyperlipidemia unspecified on high-dose Crestor 5. Anemia.   Iron studies done with iron saturation of 21% suggestive of anemia of chronic disease.  Hemoglobin stable at 8.7   Disposition; patient clinically and hemodynamically stable.  I called patient's sister Ms. Margaret and updated her on treatment and discharge plans today.  All questions were answered.  DISCHARGE CONDITIONS:  Stable CONSULTS OBTAINED:  Treatment Team:  Neville Route, MD Yolonda Kida, MD Teodoro Spray, MD DRUG ALLERGIES:   Allergies  Allergen Reactions  . Nsaids     CKD stage III - Avoid all nephrotoxic drugs   DISCHARGE MEDICATIONS:   Allergies as of 04/15/2019      Reactions   Nsaids    CKD stage III - Avoid all nephrotoxic drugs      Medication List    TAKE these medications   Accu-Chek Guide test strip Generic drug: glucose blood USE TO CHECK BLOOD SUGARS TWICE DAILY. E11.9   amLODipine  10 MG tablet Commonly known as: NORVASC TAKE 1 TABLET BY MOUTH EVERY DAY   aspirin 81 MG tablet Take 81 mg by mouth daily.   blood glucose meter kit and supplies Dispense based on patient and insurance preference. Use up to four times daily as directed. E11.9   blood glucose meter kit and supplies Kit Dispense based on patient and insurance preference. Check  CBGs two times daily. E11.9.   clopidogrel 75 MG tablet Commonly known as: PLAVIX Take 1 tablet (75 mg total) by mouth daily.   glimepiride 2 MG tablet Commonly known as: AMARYL Take 2 mg by mouth daily with breakfast.   onetouch ultrasoft lancets Use as instructed   rosuvastatin 40 MG tablet Commonly known as: CRESTOR TAKE 1 TABLET BY MOUTH EVERY DAY   Vitamin D (Ergocalciferol) 1.25 MG (50000 UT) Caps capsule Commonly known as: DRISDOL Take 50,000 Units by mouth every 30 (thirty) days.        DISCHARGE INSTRUCTIONS:   DIET:  Diabetic and cardiac diet DISCHARGE CONDITION:  Stable ACTIVITY:  Activity as tolerated OXYGEN:  Home Oxygen: No.  Oxygen Delivery: room air DISCHARGE LOCATION:  home   If you experience worsening of your admission symptoms, develop shortness of breath, life threatening emergency, suicidal or homicidal thoughts you must seek medical attention immediately by calling 911 or calling your MD immediately  if symptoms less severe.  You Must read complete instructions/literature along with all the possible adverse reactions/side effects for all the Medicines you take and that have been prescribed to you. Take any new Medicines after you have completely understood and accpet all the possible adverse reactions/side effects.   Please note  You were cared for by a hospitalist during your hospital stay. If you have any questions about your discharge medications or the care you received while you were in the hospital after you are discharged, you can call the unit and asked to speak with the hospitalist on call if the hospitalist that took care of you is not available. Once you are discharged, your primary care physician will handle any further medical issues. Please note that NO REFILLS for any discharge medications will be authorized once you are discharged, as it is imperative that you return to your primary care physician (or establish a relationship with a  primary care physician if you do not have one) for your aftercare needs so that they can reassess your need for medications and monitor your lab values.    On the day of Discharge:  VITAL SIGNS:  Blood pressure (!) 162/73, pulse 65, temperature 98.9 F (37.2 C), resp. rate 16, height 5' 5"  (1.651 m), weight 83.6 kg, SpO2 100 %. PHYSICAL EXAMINATION:  GENERAL:  85 y.o.-year-old patient lying in the bed with no acute distress.  EYES: Pupils equal, round, reactive to light and accommodation. No scleral icterus. Extraocular muscles intact.  HEENT: Head atraumatic, normocephalic. Oropharynx and nasopharynx clear.  NECK:  Supple, no jugular venous distention. No thyroid enlargement, no tenderness.  LUNGS: Normal breath sounds bilaterally, no wheezing, rales,rhonchi or crepitation. No use of accessory muscles of respiration.  CARDIOVASCULAR: S1, S2 normal. No murmurs, rubs, or gallops.  ABDOMEN: Soft, non-tender, non-distended. Bowel sounds present. No organomegaly or mass.  EXTREMITIES: No pedal edema, cyanosis, or clubbing.  NEUROLOGIC: Cranial nerves II through XII are intact. Muscle strength 5/5 in all extremities. Sensation intact. Gait not checked.  PSYCHIATRIC: The patient is alert and oriented x 3.  SKIN: No obvious rash, lesion, or  ulcer.  DATA REVIEW:   CBC Recent Labs  Lab 04/13/19 0429  WBC 3.9*  HGB 8.7*  HCT 25.5*  PLT 139*    Chemistries  Recent Labs  Lab 04/12/19 1557  04/14/19 0520  NA 135   < > 137  K 4.3   < > 4.4  CL 101   < > 107  CO2 24   < > 24  GLUCOSE 455*   < > 261*  BUN 25*   < > 30*  CREATININE 2.19*   < > 1.96*  CALCIUM 8.9   < > 8.6*  MG  --   --  2.2  AST 25  --   --   ALT 19  --   --   ALKPHOS 56  --   --   BILITOT 0.5  --   --    < > = values in this interval not displayed.     Microbiology Results  Results for orders placed or performed during the hospital encounter of 04/12/19  SARS CORONAVIRUS 2 (TAT 6-24 HRS) Nasopharyngeal  Nasopharyngeal Swab     Status: None   Collection Time: 04/12/19  7:59 PM   Specimen: Nasopharyngeal Swab  Result Value Ref Range Status   SARS Coronavirus 2 NEGATIVE NEGATIVE Final    Comment: (NOTE) SARS-CoV-2 target nucleic acids are NOT DETECTED. The SARS-CoV-2 RNA is generally detectable in upper and lower respiratory specimens during the acute phase of infection. Negative results do not preclude SARS-CoV-2 infection, do not rule out co-infections with other pathogens, and should not be used as the sole basis for treatment or other patient management decisions. Negative results must be combined with clinical observations, patient history, and epidemiological information. The expected result is Negative. Fact Sheet for Patients: SugarRoll.be Fact Sheet for Healthcare Providers: https://www.woods-mathews.com/ This test is not yet approved or cleared by the [place] FDA and  has been authorized for detection and/or diagnosis of SARS-CoV-2 by FDA under an Emergency Use Authorization (EUA). This EUA will remain  in effect (meaning this test can be used) for the duration of the COVID-19 declaration under Section 56 4(b)(1) of the Act, 21 U.S.C. section 360bbb-3(b)(1), unless the authorization is terminated or revoked sooner. Performed at [facility], [place] [address], [place], [place] [address]     RADIOLOGY:  No results found.   Management plans discussed with the patient, family and they are in agreement.  CODE STATUS: Full Code   TOTAL TIME TAKING CARE OF THIS PATIENT: 34 minutes.    Annalina Needles M.D on 04/15/2019 at 10:38 AM  Between 7am to 6pm - Pager - [phone]  After 6pm go to [email or web address] - Technical brewer [place] [facility]  Office  [phone]  CC: Primary care physician; [name], MD   Note: This dictation was prepared with Dragon dictation along with smaller phrase  technology. Any transcriptional errors that result from this process are unintentional.

## 2019-04-15 NOTE — Progress Notes (Signed)
Patient wanted me to check her blood sugar before she was discharged because before lunch it was 389 and this time it is 366. She is worried about being discharged with having a high blood sugar. I messaged Dr. Stark Jock and he ordered Novolog 10 units one time dose and recheck blood sugar at 1600 and if blood sugar is better she can be discharged home.

## 2019-04-15 NOTE — TOC Transition Note (Signed)
Transition of Care [facility]) - CM/SW Discharge Note   Patient Details  Name: Brianna Dodson MRN: [id number] Date of Birth: 1933-08-30  Transition of Care [facility]) CM/SW Contact:  Shelbie Hutching, RN Phone Number: 04/15/2019, 11:26 AM   Clinical Narrative:    Patient is medically stable for discharge today.  Patient's sister [name] will pick the patient up at discharge.  Patient has been open with [facility] for RN and PT but the patient now needs speech therapy follow up and [facility] currently does not have a speech therapist.  Patient has been closed to [facility] and referral given to [facility] with [name].  Home Health orders will be placed for RN, PT, OT, and speech therapy.  OT recommended a bedside commode- [facility] will provide the 3 in 1.     Final next level of care: Home w Home Health Services Barriers to Discharge: Barriers Resolved   Patient Goals and CMS Choice   CMS Medicare.gov Compare Post Acute Care list provided to:: Patient Choice offered to / list presented to : Patient  Discharge Placement                       Discharge Plan and Services   Discharge Planning Services: CM Consult Post Acute Care Choice: Home Health, Resumption of Svcs/PTA Provider          DME Arranged: 3-N-1 DME Agency: [place] Date DME Agency Contacted: 04/15/19 Time DME Agency Contacted: [id number] Representative spoke with at DME Agency: [place]: RN, OT, Speech Therapy, PT [place] Agency: [place] Date [place]: 04/15/19 Time [place]: [id number] Representative spoke with at [place]: [place] ([place]) Interventions     Readmission Risk Interventions Readmission Risk Prevention Plan 03/16/2019  Transportation Screening Complete  PCP or Specialist Appt within 5-7 Days Complete  Home Care Screening Complete  Medication Review (RN CM) Complete  Some recent data might be hidden

## 2019-04-15 NOTE — Progress Notes (Signed)
Subjective: significant improvement in speech   Past Medical History:  Diagnosis Date  . CKD (chronic kidney disease), stage III   . Diabetes mellitus without complication ([place])   . History of blood transfusion   . Hyperlipidemia   . Hypertension   . Stroke [facility])     Past Surgical History:  Procedure Laterality Date  . ABDOMINAL HYSTERECTOMY  1985  . TEE WITHOUT CARDIOVERSION N/A 04/14/2019   Procedure: TRANSESOPHAGEAL ECHOCARDIOGRAM (TEE);  Surgeon: Teodoro Spray, MD;  Location: [facility] ORS;  Service: Cardiovascular;  Laterality: N/A;    Family History  Problem Relation Age of Onset  . Stroke Mother   . Diabetes Mother   . Heart disease Father   . Kidney disease Sister   . Diabetes Sister   . Kidney disease Brother   . Heart disease Sister   . Diabetes Sister   . Diabetes Sister   . Diabetes Sister   . Kidney disease Sister   . Heart disease Sister   . Kidney disease Brother        kidney transplant  . Early death Brother 57       Truck Accident - died  . Heart disease Brother     Social History:  reports that she has never smoked. She has never used smokeless tobacco. She reports that she does not drink alcohol or use drugs.  Allergies  Allergen Reactions  . Nsaids     CKD stage III - Avoid all nephrotoxic drugs    Medications: I have reviewed the patient's current medications.  ROS: As per HPI Physical Examination: Blood pressure (!) 162/73, pulse 65, temperature 98.9 F (37.2 C), resp. rate 16, height 5\' 5"  (1.651 m), weight 83.6 kg, SpO2 100 %.    Neurological Examination   Mental Status: Alert, oriented, thought content appropriate.  Periods of aphasia Cranial Nerves: II: Discs flat bilaterally; Visual fields grossly normal, pupils equal, round, reactive to light and accommodation III,IV, VI: ptosis not present, extra-ocular motions intact bilaterally V,VII: smile symmetric, facial light touch sensation normal bilaterally VIII: hearing normal  bilaterally IX,X: gag reflex present XI: bilateral shoulder shrug XII: midline tongue extension Motor: Right : Upper extremity   5/5    Left:     Upper extremity   5/5  Lower extremity   5/5     Lower extremity   5/5 Tone and bulk:normal tone throughout; no atrophy noted Sensory: Pinprick and light touch intact throughout, bilaterally Deep Tendon Reflexes: 1+ and symmetric throughout Plantars: Right: downgoing   Left: downgoing Cerebellar: normal finger-to-nose, normal rapid alternating movements and normal heel-to-shin test Gait: not tested      Results for orders placed or performed during the hospital encounter of 04/12/19 (from the past 48 hour(s))  Glucose, capillary     Status: Abnormal   Collection Time: 04/13/19 12:21 PM  Result Value Ref Range   Glucose-Capillary 243 (H) 70 - 99 mg/dL   Comment 1 Notify RN   Glucose, capillary     Status: Abnormal   Collection Time: 04/13/19  5:06 PM  Result Value Ref Range   Glucose-Capillary 126 (H) 70 - 99 mg/dL   Comment 1 Notify RN   Glucose, capillary     Status: Abnormal   Collection Time: 04/13/19  8:53 PM  Result Value Ref Range   Glucose-Capillary 147 (H) 70 - 99 mg/dL  Basic metabolic panel     Status: Abnormal   Collection Time: 04/14/19  5:20 AM  Result Value  Ref Range   Sodium 137 135 - 145 mmol/L   Potassium 4.4 3.5 - 5.1 mmol/L   Chloride 107 98 - 111 mmol/L   CO2 24 22 - 32 mmol/L   Glucose, Bld 261 (H) 70 - 99 mg/dL   BUN 30 (H) 8 - 23 mg/dL   Creatinine, Ser 1.96 (H) 0.44 - 1.00 mg/dL   Calcium 8.6 (L) 8.9 - 10.3 mg/dL   GFR calc non Af Amer 23 (L) >60 mL/min   GFR calc Af Amer 27 (L) >60 mL/min   Anion gap 6 5 - 15    Comment: Performed at [facility], [address], [place], [place] [address]  Magnesium     Status: None   Collection Time: 04/14/19  5:20 AM  Result Value Ref Range   Magnesium 2.2 1.7 - 2.4 mg/dL    Comment: Performed at [facility], [address]  [place], [place] [address]  Glucose, capillary     Status: Abnormal   Collection Time: 04/14/19  8:07 AM  Result Value Ref Range   Glucose-Capillary 209 (H) 70 - 99 mg/dL  Glucose, capillary     Status: Abnormal   Collection Time: 04/14/19 11:30 AM  Result Value Ref Range   Glucose-Capillary 188 (H) 70 - 99 mg/dL  Glucose, capillary     Status: Abnormal   Collection Time: 04/14/19  4:53 PM  Result Value Ref Range   Glucose-Capillary 184 (H) 70 - 99 mg/dL  Glucose, capillary     Status: Abnormal   Collection Time: 04/14/19  9:06 PM  Result Value Ref Range   Glucose-Capillary 380 (H) 70 - 99 mg/dL  Glucose, capillary     Status: Abnormal   Collection Time: 04/15/19  7:35 AM  Result Value Ref Range   Glucose-Capillary 245 (H) 70 - 99 mg/dL    Recent Results (from the past 240 hour(s))  SARS CORONAVIRUS 2 (TAT 6-24 HRS) Nasopharyngeal Nasopharyngeal Swab     Status: None   Collection Time: 04/12/19  7:59 PM   Specimen: Nasopharyngeal Swab  Result Value Ref Range Status   SARS Coronavirus 2 NEGATIVE NEGATIVE Final    Comment: (NOTE) SARS-CoV-2 target nucleic acids are NOT DETECTED. The SARS-CoV-2 RNA is generally detectable in upper and lower respiratory specimens during the acute phase of infection. Negative results do not preclude SARS-CoV-2 infection, do not rule out co-infections with other pathogens, and should not be used as the sole basis for treatment or other patient management decisions. Negative results must be combined with clinical observations, patient history, and epidemiological information. The expected result is Negative. Fact Sheet for Patients: SugarRoll.be Fact Sheet for Healthcare Providers: https://www.woods-mathews.com/ This test is not yet approved or cleared by the [place] FDA and  has been authorized for detection and/or diagnosis of SARS-CoV-2 by FDA under an Emergency Use Authorization (EUA). This EUA will  remain  in effect (meaning this test can be used) for the duration of the COVID-19 declaration under Section 56 4(b)(1) of the Act, 21 U.S.C. section 360bbb-3(b)(1), unless the authorization is terminated or revoked sooner. Performed at [facility], [place] [address] [place], [place] [address]     No results found.   Assessment/Plan: 85 y/o with a history of HTN, DM,  HLD, recent admission for right sided stroke on 03/2019 on ASA/Plavix admitted with slurred speech and trouble finding words  In whom euro exam expressive aphasia and slight dysarthria,  MRI/MRA hows  Multiple new foci  of acute infarction affect the RIGHThemisphereaffecting the frontal, posterior frontal, posterior temporal,anterior parietal cortex and regional white matter. The lineardistribution of the lesions raises the question of watershed type hypoperfusion insult, versus a shower of emboli.Moderate atrophy with extensive small vessel disease.No extracranial flow reducing lesion is evident, within limits for detection on noncontrast MRA neck.    RECS: - TEE no abnormalities  - d/c today with holter as out patient - Con't ASA 81mg  and Plavix 75 daily.    04/15/2019, 11:56 AM

## 2019-04-16 ENCOUNTER — Telehealth: Payer: Self-pay | Admitting: Family Medicine

## 2019-04-16 ENCOUNTER — Telehealth: Payer: Self-pay

## 2019-04-16 DIAGNOSIS — E785 Hyperlipidemia, unspecified: Secondary | ICD-10-CM | POA: Diagnosis not present

## 2019-04-16 DIAGNOSIS — N183 Chronic kidney disease, stage 3 unspecified: Secondary | ICD-10-CM | POA: Diagnosis not present

## 2019-04-16 DIAGNOSIS — Z7984 Long term (current) use of oral hypoglycemic drugs: Secondary | ICD-10-CM | POA: Diagnosis not present

## 2019-04-16 DIAGNOSIS — Z7902 Long term (current) use of antithrombotics/antiplatelets: Secondary | ICD-10-CM | POA: Diagnosis not present

## 2019-04-16 DIAGNOSIS — E1122 Type 2 diabetes mellitus with diabetic chronic kidney disease: Secondary | ICD-10-CM | POA: Diagnosis not present

## 2019-04-16 DIAGNOSIS — I69320 Aphasia following cerebral infarction: Secondary | ICD-10-CM | POA: Diagnosis not present

## 2019-04-16 DIAGNOSIS — Z7982 Long term (current) use of aspirin: Secondary | ICD-10-CM | POA: Diagnosis not present

## 2019-04-16 DIAGNOSIS — Z9181 History of falling: Secondary | ICD-10-CM | POA: Diagnosis not present

## 2019-04-16 DIAGNOSIS — I129 Hypertensive chronic kidney disease with stage 1 through stage 4 chronic kidney disease, or unspecified chronic kidney disease: Secondary | ICD-10-CM | POA: Diagnosis not present

## 2019-04-16 DIAGNOSIS — R482 Apraxia: Secondary | ICD-10-CM | POA: Diagnosis not present

## 2019-04-16 DIAGNOSIS — D631 Anemia in chronic kidney disease: Secondary | ICD-10-CM | POA: Diagnosis not present

## 2019-04-16 NOTE — Telephone Encounter (Signed)
Copied from [place] [id number]. Topic: General - Other >> Apr 16, 2019  1:57 PM Keene Breath wrote: Reason for CRM: Called to request verbal orders for:  Nursing 1x wk for 4 wks, Hospital requesting speech OT/PT  as well.  Please call to verify at [phone]

## 2019-04-16 NOTE — Telephone Encounter (Signed)
Transition Care Management Follow-up Telephone Call   Date discharged?04/15/19   How have you been since you were released from the hospital? Patient states, "Okay, my blood sugars were running high in the hospital and they gave me insulin. I am doing okay now but going to call Warnell Forester with [facility] because she is following me for my sugars."   Do you understand why you were in the hospital? Yes, I had trouble with my speech.   Do you understand the discharge instructions? Yes, follow up with speech therapy, pcp and Cardiology.    Where were you discharged to? Home   Items Reviewed:  Medications reviewed: Yes  Allergies reviewed: Yes  Dietary changes reviewed: Diabetic and cardiac diet  Referrals reviewed: Yes, cardiology scheduled on 04/21/19. Speech therapy in progress.    Functional Questionnaire:   Activities of Daily Living (ADLs):   She states they are independent in the following: Independent in all ADLs. States they require assistance with the following: No assistance required.   Any transportation issues/concerns?: None at this time. Her sister will bring her to the appointment.    Any patient concerns? Yes   Confirmed importance and date/time of follow-up visits scheduled Yes, appointment scheduled 04/18/19 at 11:30 in office.  Provider Appointment booked with Dr. Caryl Bis, pcp.  Confirmed with patient if condition begins to worsen call PCP or go to the ER.  Patient was given the office number and encouraged to call back with question or concerns.  : Yes.

## 2019-04-16 NOTE — Telephone Encounter (Signed)
Called [name] w/ [facility].  Left voice message giving verbal orders for skilled nursing as requested and OT/PT/Speech eval as requested.

## 2019-04-17 ENCOUNTER — Other Ambulatory Visit: Payer: Self-pay

## 2019-04-17 DIAGNOSIS — E785 Hyperlipidemia, unspecified: Secondary | ICD-10-CM | POA: Diagnosis not present

## 2019-04-17 DIAGNOSIS — I129 Hypertensive chronic kidney disease with stage 1 through stage 4 chronic kidney disease, or unspecified chronic kidney disease: Secondary | ICD-10-CM | POA: Diagnosis not present

## 2019-04-17 DIAGNOSIS — D631 Anemia in chronic kidney disease: Secondary | ICD-10-CM | POA: Diagnosis not present

## 2019-04-17 DIAGNOSIS — I69320 Aphasia following cerebral infarction: Secondary | ICD-10-CM | POA: Diagnosis not present

## 2019-04-17 DIAGNOSIS — E1122 Type 2 diabetes mellitus with diabetic chronic kidney disease: Secondary | ICD-10-CM | POA: Diagnosis not present

## 2019-04-17 DIAGNOSIS — N183 Chronic kidney disease, stage 3 unspecified: Secondary | ICD-10-CM | POA: Diagnosis not present

## 2019-04-18 ENCOUNTER — Telehealth: Payer: Self-pay | Admitting: Family Medicine

## 2019-04-18 ENCOUNTER — Other Ambulatory Visit: Payer: Self-pay

## 2019-04-18 ENCOUNTER — Ambulatory Visit (INDEPENDENT_AMBULATORY_CARE_PROVIDER_SITE_OTHER): Payer: Medicare Other | Admitting: Family Medicine

## 2019-04-18 ENCOUNTER — Encounter: Payer: Self-pay | Admitting: Family Medicine

## 2019-04-18 VITALS — BP 140/78 | HR 72 | Temp 96.6°F | Ht 65.0 in | Wt 187.4 lb

## 2019-04-18 DIAGNOSIS — D649 Anemia, unspecified: Secondary | ICD-10-CM | POA: Diagnosis not present

## 2019-04-18 DIAGNOSIS — I6930 Unspecified sequelae of cerebral infarction: Secondary | ICD-10-CM | POA: Diagnosis not present

## 2019-04-18 DIAGNOSIS — N186 End stage renal disease: Secondary | ICD-10-CM | POA: Insufficient documentation

## 2019-04-18 DIAGNOSIS — I639 Cerebral infarction, unspecified: Secondary | ICD-10-CM | POA: Diagnosis not present

## 2019-04-18 DIAGNOSIS — N183 Chronic kidney disease, stage 3 unspecified: Secondary | ICD-10-CM | POA: Diagnosis not present

## 2019-04-18 DIAGNOSIS — R4701 Aphasia: Secondary | ICD-10-CM | POA: Diagnosis not present

## 2019-04-18 DIAGNOSIS — E1122 Type 2 diabetes mellitus with diabetic chronic kidney disease: Secondary | ICD-10-CM

## 2019-04-18 LAB — CBC
HCT: 28.2 % — ABNORMAL LOW (ref 36.0–46.0)
Hemoglobin: 9.7 g/dL — ABNORMAL LOW (ref 12.0–15.0)
MCHC: 34.3 g/dL (ref 30.0–36.0)
MCV: 84.8 fl (ref 78.0–100.0)
Platelets: 175 10*3/uL (ref 150.0–400.0)
RBC: 3.32 Mil/uL — ABNORMAL LOW (ref 3.87–5.11)
RDW: 13.7 % (ref 11.5–15.5)
WBC: 3.9 10*3/uL — ABNORMAL LOW (ref 4.0–10.5)

## 2019-04-18 LAB — BASIC METABOLIC PANEL
BUN: 31 mg/dL — ABNORMAL HIGH (ref 6–23)
CO2: 25 mEq/L (ref 19–32)
Calcium: 9.2 mg/dL (ref 8.4–10.5)
Chloride: 106 mEq/L (ref 96–112)
Creatinine, Ser: 2.17 mg/dL — ABNORMAL HIGH (ref 0.40–1.20)
GFR: 26.17 mL/min — ABNORMAL LOW (ref >60.00)
Glucose, Bld: 206 mg/dL — ABNORMAL HIGH (ref 70–99)
Potassium: 4.2 mEq/L (ref 3.5–5.1)
Sodium: 140 mEq/L (ref 135–145)

## 2019-04-18 MED ORDER — GLIMEPIRIDE 1 MG PO TABS: 3.0000 mg | ORAL_TABLET | Freq: Every day | ORAL | 1 refills | Status: DC

## 2019-04-18 NOTE — Assessment & Plan Note (Signed)
Uncontrolled.  Will increase glimepiride to 3 mg by mouth daily with breakfast.  Discussed the importance of taking this with food.  Discussed the risk of hypoglycemia with this.  She will see her endocrinologist as well.

## 2019-04-18 NOTE — Progress Notes (Signed)
Brianna Rumps, MD Phone: [phone]  Rylynn Kobs is a 85 y.o. female who presents today for f/u.  CVA: Patient was hospitalized recently for an acute CVA.  She developed slurred speech and aphasia.  She underwent MRI revealing multiple new foci of acute infarction affecting the right hemisphere.  The distribution of the lesions raised questions of watershed type hypoperfusion insult versus shower of emboli.  She had MRA that revealed no extracranial flow reducing lesion.  She underwent a TEE by cardiology which was negative for thrombus.  She has an appointment on Monday for Holter monitor placement.  The patient and her sister notes she still has some aphasia though no new symptoms.  No numbness or weakness.  No vision changes.  Speech therapy is coming out today.  She is also going to complete occupational therapy and physical therapy as well.  Her sister reports she sees neurology in November.  They just bought a blood pressure cuff and will start checking her blood pressures.  Diabetes: CBGs were significantly elevated into the 200s and 300s in the hospital.  Her A1c had worsened 8.8.  She notes no hypoglycemic episodes.  She is on glimepiride 2 mg daily.  Prior to going to the hospital her CBGs were reportedly in the low 100s.  Anemia: Patient noted to be progressively anemic with most recent hemoglobin of 8.7.  Iron studies were normal.  She notes no blood in her stool, vaginal bleeding, or hematuria.  Social History   Tobacco Use  Smoking Status Never Smoker  Smokeless Tobacco Never Used     ROS see history of present illness  Objective  Physical Exam Vitals:   04/18/19 1142 04/18/19 1203  BP: 140/70 140/78  Pulse: 72   Temp: (!) 96.6 F (35.9 C)   SpO2: 98%     BP Readings from Last 3 Encounters:  04/18/19 140/78  04/15/19 (!) 128/56  03/28/19 130/70   Wt Readings from Last 3 Encounters:  04/18/19 187 lb 6.4 oz (85 kg)  04/12/19 184 lb 4.9 oz (83.6 kg)   03/28/19 190 lb (86.2 kg)    Physical Exam Constitutional:      General: She is not in acute distress.    Appearance: She is not diaphoretic.  Cardiovascular:     Rate and Rhythm: Normal rate and regular rhythm.     Heart sounds: Normal heart sounds.  Pulmonary:     Effort: Pulmonary effort is normal.     Breath sounds: Normal breath sounds.  Musculoskeletal:     Right lower leg: No edema.     Left lower leg: No edema.  Skin:    General: Skin is warm and dry.  Neurological:     Mental Status: She is alert.     Comments: Mild aphasia noted otherwise CN 2-12 intact, 5/5 strength in bilateral biceps, triceps, grip, quads, hamstrings, plantar and dorsiflexion, sensation to light touch intact in bilateral UE and LE, normal gait, 2+ patellar reflexes      Assessment/Plan: Please see individual problem list.  History of stroke with current residual effects She does continue to have some mild aphasia.  Otherwise asymptomatic.  Discussed risk factors of hypertension, diabetes, and hyperlipidemia.  Cholesterol is well controlled.  Diabetes is uncontrolled and we will alter her regimen for this.  She will follow-up with her endocrinologist as well.  Discussed that she needs to complete the Holter monitor to evaluate for atrial fibrillation.  Advised if she develops any worsening neurological symptoms or  recurrent neurological symptoms she needs to seek medical attention in the emergency room.  She will monitor her blood pressure once daily for the next week and then contact us with the readings.  DM type 2 (diabetes mellitus, type 2) Uncontrolled.  Will increase glimepiride to 3 mg by mouth daily with breakfast.  Discussed the importance of taking this with food.  Discussed the risk of hypoglycemia with this.  She will see her endocrinologist as well.  Anemia Likely related to chronic disease given iron pattern.  Will recheck CBC today.   Orders Placed This Encounter  Procedures  . Basic  Metabolic Panel (BMET)  . CBC    Meds ordered this encounter  Medications  . glimepiride (AMARYL) 1 MG tablet    Sig: Take 3 tablets (3 mg total) by mouth daily with breakfast.    Dispense:  270 tablet    Refill:  1     Brianna Rumps, MD [place]

## 2019-04-18 NOTE — Telephone Encounter (Signed)
BP for today is 135/75.

## 2019-04-18 NOTE — Assessment & Plan Note (Signed)
Likely related to chronic disease given iron pattern.  Will recheck CBC today.

## 2019-04-18 NOTE — Assessment & Plan Note (Signed)
She does continue to have some mild aphasia.  Otherwise asymptomatic.  Discussed risk factors of hypertension, diabetes, and hyperlipidemia.  Cholesterol is well controlled.  Diabetes is uncontrolled and we will alter her regimen for this.  She will follow-up with her endocrinologist as well.  Discussed that she needs to complete the Holter monitor to evaluate for atrial fibrillation.  Advised if she develops any worsening neurological symptoms or recurrent neurological symptoms she needs to seek medical attention in the emergency room.  She will monitor her blood pressure once daily for the next week and then contact us with the readings.

## 2019-04-18 NOTE — Patient Instructions (Signed)
Nice to see you. Please keep your appointment for the heart monitor on Monday. We will check lab work today and contact you with the results. Please check your blood pressure daily for 1 week and let us know what your readings are. I have increased your glimepiride to 3 mg daily.  Please monitor your blood sugars and if they drop to less than 80 please eat something and let us know.  Please take this medication when you eat breakfast in the morning. If you have any recurrent stroke symptoms, slurred speech, numbness, weakness, or vision changes please seek medical attention immediately.

## 2019-04-21 DIAGNOSIS — N183 Chronic kidney disease, stage 3 unspecified: Secondary | ICD-10-CM | POA: Diagnosis not present

## 2019-04-21 DIAGNOSIS — Z8673 Personal history of transient ischemic attack (TIA), and cerebral infarction without residual deficits: Secondary | ICD-10-CM | POA: Diagnosis not present

## 2019-04-21 DIAGNOSIS — I10 Essential (primary) hypertension: Secondary | ICD-10-CM | POA: Diagnosis not present

## 2019-04-21 DIAGNOSIS — E1122 Type 2 diabetes mellitus with diabetic chronic kidney disease: Secondary | ICD-10-CM | POA: Diagnosis not present

## 2019-04-21 DIAGNOSIS — E785 Hyperlipidemia, unspecified: Secondary | ICD-10-CM | POA: Diagnosis not present

## 2019-04-21 DIAGNOSIS — E1165 Type 2 diabetes mellitus with hyperglycemia: Secondary | ICD-10-CM | POA: Diagnosis not present

## 2019-04-22 DIAGNOSIS — D631 Anemia in chronic kidney disease: Secondary | ICD-10-CM | POA: Diagnosis not present

## 2019-04-22 DIAGNOSIS — I69320 Aphasia following cerebral infarction: Secondary | ICD-10-CM | POA: Diagnosis not present

## 2019-04-22 DIAGNOSIS — I129 Hypertensive chronic kidney disease with stage 1 through stage 4 chronic kidney disease, or unspecified chronic kidney disease: Secondary | ICD-10-CM | POA: Diagnosis not present

## 2019-04-22 DIAGNOSIS — E1122 Type 2 diabetes mellitus with diabetic chronic kidney disease: Secondary | ICD-10-CM | POA: Diagnosis not present

## 2019-04-22 DIAGNOSIS — E785 Hyperlipidemia, unspecified: Secondary | ICD-10-CM | POA: Diagnosis not present

## 2019-04-22 DIAGNOSIS — N183 Chronic kidney disease, stage 3 unspecified: Secondary | ICD-10-CM | POA: Diagnosis not present

## 2019-04-22 NOTE — Addendum Note (Signed)
Addended by: Leeanne Rio on: 04/22/2019 10:20 AM   Modules accepted: Orders

## 2019-04-22 NOTE — Telephone Encounter (Signed)
Noted. She was to check her BP once daily for the next week and then let us know the readings. Please contact her this week to see what her BP has done over the weekend. Thanks.

## 2019-04-22 NOTE — Telephone Encounter (Signed)
See below, BP reading from 04/18/19

## 2019-04-22 NOTE — Telephone Encounter (Signed)
I called the patient to ask about her BP readings and the phone just rang, no voicemail.  Byard Carranza,cma

## 2019-04-23 ENCOUNTER — Ambulatory Visit: Payer: Commercial Indemnity | Admitting: Family Medicine

## 2019-04-23 DIAGNOSIS — I129 Hypertensive chronic kidney disease with stage 1 through stage 4 chronic kidney disease, or unspecified chronic kidney disease: Secondary | ICD-10-CM | POA: Diagnosis not present

## 2019-04-23 DIAGNOSIS — D631 Anemia in chronic kidney disease: Secondary | ICD-10-CM | POA: Diagnosis not present

## 2019-04-23 DIAGNOSIS — E1122 Type 2 diabetes mellitus with diabetic chronic kidney disease: Secondary | ICD-10-CM | POA: Diagnosis not present

## 2019-04-23 DIAGNOSIS — I69320 Aphasia following cerebral infarction: Secondary | ICD-10-CM | POA: Diagnosis not present

## 2019-04-23 DIAGNOSIS — N183 Chronic kidney disease, stage 3 unspecified: Secondary | ICD-10-CM | POA: Diagnosis not present

## 2019-04-23 DIAGNOSIS — E785 Hyperlipidemia, unspecified: Secondary | ICD-10-CM | POA: Diagnosis not present

## 2019-04-25 DIAGNOSIS — E1122 Type 2 diabetes mellitus with diabetic chronic kidney disease: Secondary | ICD-10-CM | POA: Diagnosis not present

## 2019-04-25 DIAGNOSIS — E785 Hyperlipidemia, unspecified: Secondary | ICD-10-CM | POA: Diagnosis not present

## 2019-04-25 DIAGNOSIS — N183 Chronic kidney disease, stage 3 unspecified: Secondary | ICD-10-CM | POA: Diagnosis not present

## 2019-04-25 DIAGNOSIS — D631 Anemia in chronic kidney disease: Secondary | ICD-10-CM | POA: Diagnosis not present

## 2019-04-25 DIAGNOSIS — I129 Hypertensive chronic kidney disease with stage 1 through stage 4 chronic kidney disease, or unspecified chronic kidney disease: Secondary | ICD-10-CM | POA: Diagnosis not present

## 2019-04-25 DIAGNOSIS — I69320 Aphasia following cerebral infarction: Secondary | ICD-10-CM | POA: Diagnosis not present

## 2019-04-28 ENCOUNTER — Telehealth: Payer: Self-pay | Admitting: Family Medicine

## 2019-04-28 NOTE — Telephone Encounter (Signed)
Elder Cyphers, from [facility], called stating she is needing the pts residuals for pts CVA. Please advise.    [phone]

## 2019-04-29 ENCOUNTER — Telehealth: Payer: Self-pay | Admitting: *Deleted

## 2019-04-29 DIAGNOSIS — D631 Anemia in chronic kidney disease: Secondary | ICD-10-CM | POA: Diagnosis not present

## 2019-04-29 DIAGNOSIS — I129 Hypertensive chronic kidney disease with stage 1 through stage 4 chronic kidney disease, or unspecified chronic kidney disease: Secondary | ICD-10-CM | POA: Diagnosis not present

## 2019-04-29 DIAGNOSIS — E785 Hyperlipidemia, unspecified: Secondary | ICD-10-CM | POA: Diagnosis not present

## 2019-04-29 DIAGNOSIS — E1122 Type 2 diabetes mellitus with diabetic chronic kidney disease: Secondary | ICD-10-CM | POA: Diagnosis not present

## 2019-04-29 DIAGNOSIS — I69320 Aphasia following cerebral infarction: Secondary | ICD-10-CM | POA: Diagnosis not present

## 2019-04-29 DIAGNOSIS — N183 Chronic kidney disease, stage 3 unspecified: Secondary | ICD-10-CM | POA: Diagnosis not present

## 2019-04-29 MED ORDER — CLOPIDOGREL BISULFATE 75 MG PO TABS: 75.0000 mg | ORAL_TABLET | Freq: Every day | ORAL | 3 refills | Status: DC

## 2019-04-29 NOTE — Telephone Encounter (Signed)
[facility]rianna Dodson, calling back, requesting a call back as soon as possible regarding below message.

## 2019-04-29 NOTE — Telephone Encounter (Signed)
Copied from [place] [id number]. Topic: General - Other >> Apr 29, 2019  3:56 PM Rainey Pines A wrote: Shauna Hugh with [name] is requesting a callback from nurse in regards to patient blood thinner medication. Contact number [phone]

## 2019-04-29 NOTE — Telephone Encounter (Signed)
Spoke with diane with [name] and she states that the hospital informed the patient to take Plavix for one month  And then stop  And she only has 4 days left, diane wants to know if you agree with this and if it could you please send a refill to her pharmacy before she runs out.  Harold Mattes,cma

## 2019-04-29 NOTE — Telephone Encounter (Signed)
She should continue the plavix at this time. Please see if they have called neurology to schedule a follow-up. Thanks.

## 2019-04-29 NOTE — Telephone Encounter (Signed)
Elder Cyphers, from [facility], called stating she is needing the pts residuals for pts CVA. Please advise. Keiley Levey,cma    [phone]

## 2019-04-29 NOTE — Telephone Encounter (Signed)
I am not sure what they mean by residuals. Please call to see what they mean by this. Thanks.

## 2019-04-30 DIAGNOSIS — E1122 Type 2 diabetes mellitus with diabetic chronic kidney disease: Secondary | ICD-10-CM | POA: Diagnosis not present

## 2019-04-30 DIAGNOSIS — E785 Hyperlipidemia, unspecified: Secondary | ICD-10-CM | POA: Diagnosis not present

## 2019-04-30 DIAGNOSIS — I69320 Aphasia following cerebral infarction: Secondary | ICD-10-CM | POA: Diagnosis not present

## 2019-04-30 DIAGNOSIS — D631 Anemia in chronic kidney disease: Secondary | ICD-10-CM | POA: Diagnosis not present

## 2019-04-30 DIAGNOSIS — N183 Chronic kidney disease, stage 3 unspecified: Secondary | ICD-10-CM | POA: Diagnosis not present

## 2019-04-30 DIAGNOSIS — I129 Hypertensive chronic kidney disease with stage 1 through stage 4 chronic kidney disease, or unspecified chronic kidney disease: Secondary | ICD-10-CM | POA: Diagnosis not present

## 2019-05-01 DIAGNOSIS — D631 Anemia in chronic kidney disease: Secondary | ICD-10-CM | POA: Diagnosis not present

## 2019-05-01 DIAGNOSIS — E1169 Type 2 diabetes mellitus with other specified complication: Secondary | ICD-10-CM | POA: Diagnosis not present

## 2019-05-01 DIAGNOSIS — I129 Hypertensive chronic kidney disease with stage 1 through stage 4 chronic kidney disease, or unspecified chronic kidney disease: Secondary | ICD-10-CM | POA: Diagnosis not present

## 2019-05-01 DIAGNOSIS — N183 Chronic kidney disease, stage 3 unspecified: Secondary | ICD-10-CM | POA: Diagnosis not present

## 2019-05-01 DIAGNOSIS — N184 Chronic kidney disease, stage 4 (severe): Secondary | ICD-10-CM | POA: Diagnosis not present

## 2019-05-01 DIAGNOSIS — E1165 Type 2 diabetes mellitus with hyperglycemia: Secondary | ICD-10-CM | POA: Diagnosis not present

## 2019-05-01 DIAGNOSIS — E785 Hyperlipidemia, unspecified: Secondary | ICD-10-CM | POA: Diagnosis not present

## 2019-05-01 DIAGNOSIS — I10 Essential (primary) hypertension: Secondary | ICD-10-CM | POA: Diagnosis not present

## 2019-05-01 DIAGNOSIS — I69320 Aphasia following cerebral infarction: Secondary | ICD-10-CM | POA: Diagnosis not present

## 2019-05-01 DIAGNOSIS — E1122 Type 2 diabetes mellitus with diabetic chronic kidney disease: Secondary | ICD-10-CM | POA: Diagnosis not present

## 2019-05-01 NOTE — Telephone Encounter (Signed)
Please advise the patient that she should call Dr Trena Platt office to try to move up her appointment. She should tell them that she was hospitalized for another stroke and needs a hospital follow-up appointment. Thanks.

## 2019-05-01 NOTE — Telephone Encounter (Signed)
Patient has an appointment with Neurology but it is not until January. Dr. Manuella Ghazi.

## 2019-05-01 NOTE — Telephone Encounter (Signed)
Faxed Elder Cyphers copy of office notes and reviewed notes with her for stroke residual affects.

## 2019-05-01 NOTE — Telephone Encounter (Signed)
Left message with patient family member to have patient call office.

## 2019-05-01 NOTE — Telephone Encounter (Signed)
What side of weakness did CVA cause ? What are the end results from her stroke ? Please call lynn for further explanation

## 2019-05-02 DIAGNOSIS — I480 Paroxysmal atrial fibrillation: Secondary | ICD-10-CM | POA: Diagnosis not present

## 2019-05-04 DIAGNOSIS — Z7984 Long term (current) use of oral hypoglycemic drugs: Secondary | ICD-10-CM

## 2019-05-04 DIAGNOSIS — N183 Chronic kidney disease, stage 3 unspecified: Secondary | ICD-10-CM

## 2019-05-04 DIAGNOSIS — R482 Apraxia: Secondary | ICD-10-CM

## 2019-05-04 DIAGNOSIS — Z7982 Long term (current) use of aspirin: Secondary | ICD-10-CM

## 2019-05-04 DIAGNOSIS — Z7902 Long term (current) use of antithrombotics/antiplatelets: Secondary | ICD-10-CM

## 2019-05-04 DIAGNOSIS — I69320 Aphasia following cerebral infarction: Secondary | ICD-10-CM | POA: Diagnosis not present

## 2019-05-04 DIAGNOSIS — D631 Anemia in chronic kidney disease: Secondary | ICD-10-CM

## 2019-05-04 DIAGNOSIS — I129 Hypertensive chronic kidney disease with stage 1 through stage 4 chronic kidney disease, or unspecified chronic kidney disease: Secondary | ICD-10-CM

## 2019-05-04 DIAGNOSIS — E785 Hyperlipidemia, unspecified: Secondary | ICD-10-CM | POA: Diagnosis not present

## 2019-05-04 DIAGNOSIS — E1122 Type 2 diabetes mellitus with diabetic chronic kidney disease: Secondary | ICD-10-CM

## 2019-05-04 DIAGNOSIS — Z9181 History of falling: Secondary | ICD-10-CM

## 2019-05-06 DIAGNOSIS — Z7902 Long term (current) use of antithrombotics/antiplatelets: Secondary | ICD-10-CM | POA: Diagnosis not present

## 2019-05-06 DIAGNOSIS — Z7984 Long term (current) use of oral hypoglycemic drugs: Secondary | ICD-10-CM | POA: Diagnosis not present

## 2019-05-06 DIAGNOSIS — N183 Chronic kidney disease, stage 3 unspecified: Secondary | ICD-10-CM | POA: Diagnosis not present

## 2019-05-06 DIAGNOSIS — E1122 Type 2 diabetes mellitus with diabetic chronic kidney disease: Secondary | ICD-10-CM | POA: Diagnosis not present

## 2019-05-06 DIAGNOSIS — E785 Hyperlipidemia, unspecified: Secondary | ICD-10-CM | POA: Diagnosis not present

## 2019-05-06 DIAGNOSIS — D631 Anemia in chronic kidney disease: Secondary | ICD-10-CM | POA: Diagnosis not present

## 2019-05-06 DIAGNOSIS — I129 Hypertensive chronic kidney disease with stage 1 through stage 4 chronic kidney disease, or unspecified chronic kidney disease: Secondary | ICD-10-CM | POA: Diagnosis not present

## 2019-05-06 DIAGNOSIS — R482 Apraxia: Secondary | ICD-10-CM | POA: Diagnosis not present

## 2019-05-06 DIAGNOSIS — I69320 Aphasia following cerebral infarction: Secondary | ICD-10-CM | POA: Diagnosis not present

## 2019-05-06 DIAGNOSIS — Z9181 History of falling: Secondary | ICD-10-CM | POA: Diagnosis not present

## 2019-05-06 DIAGNOSIS — Z7982 Long term (current) use of aspirin: Secondary | ICD-10-CM | POA: Diagnosis not present

## 2019-05-06 DIAGNOSIS — G4733 Obstructive sleep apnea (adult) (pediatric): Secondary | ICD-10-CM | POA: Diagnosis not present

## 2019-05-07 DIAGNOSIS — D631 Anemia in chronic kidney disease: Secondary | ICD-10-CM | POA: Diagnosis not present

## 2019-05-07 DIAGNOSIS — E1122 Type 2 diabetes mellitus with diabetic chronic kidney disease: Secondary | ICD-10-CM | POA: Diagnosis not present

## 2019-05-07 DIAGNOSIS — I129 Hypertensive chronic kidney disease with stage 1 through stage 4 chronic kidney disease, or unspecified chronic kidney disease: Secondary | ICD-10-CM | POA: Diagnosis not present

## 2019-05-07 DIAGNOSIS — I69320 Aphasia following cerebral infarction: Secondary | ICD-10-CM | POA: Diagnosis not present

## 2019-05-07 DIAGNOSIS — E785 Hyperlipidemia, unspecified: Secondary | ICD-10-CM | POA: Diagnosis not present

## 2019-05-07 DIAGNOSIS — N183 Chronic kidney disease, stage 3 unspecified: Secondary | ICD-10-CM | POA: Diagnosis not present

## 2019-05-09 DIAGNOSIS — I69320 Aphasia following cerebral infarction: Secondary | ICD-10-CM | POA: Diagnosis not present

## 2019-05-09 DIAGNOSIS — D631 Anemia in chronic kidney disease: Secondary | ICD-10-CM | POA: Diagnosis not present

## 2019-05-09 DIAGNOSIS — I129 Hypertensive chronic kidney disease with stage 1 through stage 4 chronic kidney disease, or unspecified chronic kidney disease: Secondary | ICD-10-CM | POA: Diagnosis not present

## 2019-05-09 DIAGNOSIS — N183 Chronic kidney disease, stage 3 unspecified: Secondary | ICD-10-CM | POA: Diagnosis not present

## 2019-05-09 DIAGNOSIS — Z7982 Long term (current) use of aspirin: Secondary | ICD-10-CM | POA: Diagnosis not present

## 2019-05-09 DIAGNOSIS — R482 Apraxia: Secondary | ICD-10-CM | POA: Diagnosis not present

## 2019-05-09 DIAGNOSIS — Z7902 Long term (current) use of antithrombotics/antiplatelets: Secondary | ICD-10-CM | POA: Diagnosis not present

## 2019-05-09 DIAGNOSIS — E1122 Type 2 diabetes mellitus with diabetic chronic kidney disease: Secondary | ICD-10-CM | POA: Diagnosis not present

## 2019-05-09 DIAGNOSIS — Z9181 History of falling: Secondary | ICD-10-CM | POA: Diagnosis not present

## 2019-05-09 DIAGNOSIS — Z7984 Long term (current) use of oral hypoglycemic drugs: Secondary | ICD-10-CM | POA: Diagnosis not present

## 2019-05-09 DIAGNOSIS — E785 Hyperlipidemia, unspecified: Secondary | ICD-10-CM | POA: Diagnosis not present

## 2019-05-10 ENCOUNTER — Other Ambulatory Visit: Payer: Self-pay | Admitting: Family Medicine

## 2019-05-10 DIAGNOSIS — E1122 Type 2 diabetes mellitus with diabetic chronic kidney disease: Secondary | ICD-10-CM

## 2019-05-10 DIAGNOSIS — N183 Chronic kidney disease, stage 3 unspecified: Secondary | ICD-10-CM

## 2019-05-12 ENCOUNTER — Telehealth: Payer: Self-pay

## 2019-05-12 ENCOUNTER — Telehealth: Payer: Self-pay | Admitting: Family Medicine

## 2019-05-12 NOTE — Telephone Encounter (Signed)
Caren Griffins with [name] calling and states that she is needing clarification as to if the patient is supposed to be taking Lisinopril 40MG  1 time a day? States that there is an old prescription at the house and if Dr Caryl Bis is wanting patient to take this medication, she will need a new prescription sent. Please advise.  CB#: [phone]

## 2019-05-12 NOTE — Telephone Encounter (Signed)
Copied from [place] [id number]. Topic: Quick Communication - See Telephone Encounter >> May 12, 2019  3:58 PM Loma [place] wrote: CRM for notification. See Telephone encounter for: 05/12/19. [facility] called and wanted to update pharmacy list for [facility] . All med will come from there prepackaged for her to take unless a needed antibiotic

## 2019-05-13 ENCOUNTER — Other Ambulatory Visit (INDEPENDENT_AMBULATORY_CARE_PROVIDER_SITE_OTHER): Payer: Medicare Other

## 2019-05-13 ENCOUNTER — Other Ambulatory Visit: Payer: Self-pay

## 2019-05-13 DIAGNOSIS — I69320 Aphasia following cerebral infarction: Secondary | ICD-10-CM | POA: Diagnosis not present

## 2019-05-13 DIAGNOSIS — N183 Chronic kidney disease, stage 3 unspecified: Secondary | ICD-10-CM | POA: Diagnosis not present

## 2019-05-13 DIAGNOSIS — D631 Anemia in chronic kidney disease: Secondary | ICD-10-CM | POA: Diagnosis not present

## 2019-05-13 DIAGNOSIS — E1122 Type 2 diabetes mellitus with diabetic chronic kidney disease: Secondary | ICD-10-CM | POA: Diagnosis not present

## 2019-05-13 DIAGNOSIS — D649 Anemia, unspecified: Secondary | ICD-10-CM

## 2019-05-13 DIAGNOSIS — E785 Hyperlipidemia, unspecified: Secondary | ICD-10-CM | POA: Diagnosis not present

## 2019-05-13 DIAGNOSIS — I129 Hypertensive chronic kidney disease with stage 1 through stage 4 chronic kidney disease, or unspecified chronic kidney disease: Secondary | ICD-10-CM | POA: Diagnosis not present

## 2019-05-13 LAB — CBC WITH DIFFERENTIAL/PLATELET
Basophils Absolute: 0 10*3/uL (ref 0.0–0.1)
Basophils Relative: 1 % (ref 0.0–3.0)
Eosinophils Absolute: 0.1 10*3/uL (ref 0.0–0.7)
Eosinophils Relative: 2.7 % (ref 0.0–5.0)
HCT: 29.3 % — ABNORMAL LOW (ref 36.0–46.0)
Hemoglobin: 10.1 g/dL — ABNORMAL LOW (ref 12.0–15.0)
Lymphocytes Relative: 26.6 % (ref 12.0–46.0)
Lymphs Abs: 1.1 10*3/uL (ref 0.7–4.0)
MCHC: 34.5 g/dL (ref 30.0–36.0)
MCV: 84.8 fl (ref 78.0–100.0)
Monocytes Absolute: 0.4 10*3/uL (ref 0.1–1.0)
Monocytes Relative: 9.4 % (ref 3.0–12.0)
Neutro Abs: 2.5 10*3/uL (ref 1.4–7.7)
Neutrophils Relative %: 60.3 % (ref 43.0–77.0)
Platelets: 171 10*3/uL (ref 150.0–400.0)
RBC: 3.45 Mil/uL — ABNORMAL LOW (ref 3.87–5.11)
RDW: 13.7 % (ref 11.5–15.5)
WBC: 4.1 10*3/uL (ref 4.0–10.5)

## 2019-05-14 DIAGNOSIS — N183 Chronic kidney disease, stage 3 unspecified: Secondary | ICD-10-CM | POA: Diagnosis not present

## 2019-05-14 DIAGNOSIS — D631 Anemia in chronic kidney disease: Secondary | ICD-10-CM | POA: Diagnosis not present

## 2019-05-14 DIAGNOSIS — E1122 Type 2 diabetes mellitus with diabetic chronic kidney disease: Secondary | ICD-10-CM | POA: Diagnosis not present

## 2019-05-14 DIAGNOSIS — I129 Hypertensive chronic kidney disease with stage 1 through stage 4 chronic kidney disease, or unspecified chronic kidney disease: Secondary | ICD-10-CM | POA: Diagnosis not present

## 2019-05-14 DIAGNOSIS — E785 Hyperlipidemia, unspecified: Secondary | ICD-10-CM | POA: Diagnosis not present

## 2019-05-14 DIAGNOSIS — I69320 Aphasia following cerebral infarction: Secondary | ICD-10-CM | POA: Diagnosis not present

## 2019-05-15 ENCOUNTER — Other Ambulatory Visit: Payer: Commercial Indemnity

## 2019-05-16 DIAGNOSIS — Z7984 Long term (current) use of oral hypoglycemic drugs: Secondary | ICD-10-CM | POA: Diagnosis not present

## 2019-05-16 DIAGNOSIS — I639 Cerebral infarction, unspecified: Secondary | ICD-10-CM | POA: Diagnosis not present

## 2019-05-16 DIAGNOSIS — D631 Anemia in chronic kidney disease: Secondary | ICD-10-CM | POA: Diagnosis not present

## 2019-05-16 DIAGNOSIS — R482 Apraxia: Secondary | ICD-10-CM | POA: Diagnosis not present

## 2019-05-16 DIAGNOSIS — R410 Disorientation, unspecified: Secondary | ICD-10-CM | POA: Diagnosis not present

## 2019-05-16 DIAGNOSIS — Z7902 Long term (current) use of antithrombotics/antiplatelets: Secondary | ICD-10-CM | POA: Diagnosis not present

## 2019-05-16 DIAGNOSIS — N183 Chronic kidney disease, stage 3 unspecified: Secondary | ICD-10-CM | POA: Diagnosis not present

## 2019-05-16 DIAGNOSIS — I69320 Aphasia following cerebral infarction: Secondary | ICD-10-CM | POA: Diagnosis not present

## 2019-05-16 DIAGNOSIS — Z7982 Long term (current) use of aspirin: Secondary | ICD-10-CM | POA: Diagnosis not present

## 2019-05-16 DIAGNOSIS — G4733 Obstructive sleep apnea (adult) (pediatric): Secondary | ICD-10-CM | POA: Diagnosis not present

## 2019-05-16 DIAGNOSIS — Z8673 Personal history of transient ischemic attack (TIA), and cerebral infarction without residual deficits: Secondary | ICD-10-CM | POA: Diagnosis not present

## 2019-05-16 DIAGNOSIS — E1122 Type 2 diabetes mellitus with diabetic chronic kidney disease: Secondary | ICD-10-CM | POA: Diagnosis not present

## 2019-05-16 DIAGNOSIS — Z9181 History of falling: Secondary | ICD-10-CM | POA: Diagnosis not present

## 2019-05-16 DIAGNOSIS — I129 Hypertensive chronic kidney disease with stage 1 through stage 4 chronic kidney disease, or unspecified chronic kidney disease: Secondary | ICD-10-CM | POA: Diagnosis not present

## 2019-05-16 DIAGNOSIS — G939 Disorder of brain, unspecified: Secondary | ICD-10-CM | POA: Diagnosis not present

## 2019-05-16 DIAGNOSIS — E785 Hyperlipidemia, unspecified: Secondary | ICD-10-CM | POA: Diagnosis not present

## 2019-05-27 DIAGNOSIS — I129 Hypertensive chronic kidney disease with stage 1 through stage 4 chronic kidney disease, or unspecified chronic kidney disease: Secondary | ICD-10-CM | POA: Diagnosis not present

## 2019-05-27 DIAGNOSIS — D631 Anemia in chronic kidney disease: Secondary | ICD-10-CM | POA: Diagnosis not present

## 2019-05-27 DIAGNOSIS — I69320 Aphasia following cerebral infarction: Secondary | ICD-10-CM | POA: Diagnosis not present

## 2019-05-27 DIAGNOSIS — N183 Chronic kidney disease, stage 3 unspecified: Secondary | ICD-10-CM | POA: Diagnosis not present

## 2019-05-27 DIAGNOSIS — E1122 Type 2 diabetes mellitus with diabetic chronic kidney disease: Secondary | ICD-10-CM | POA: Diagnosis not present

## 2019-05-27 DIAGNOSIS — E785 Hyperlipidemia, unspecified: Secondary | ICD-10-CM | POA: Diagnosis not present

## 2019-05-27 NOTE — Telephone Encounter (Signed)
Patient has appointment scheduled with Dr. Biagio Quint his first day back 06/02/19.

## 2019-06-02 ENCOUNTER — Encounter: Payer: Self-pay | Admitting: Family Medicine

## 2019-06-02 ENCOUNTER — Ambulatory Visit (INDEPENDENT_AMBULATORY_CARE_PROVIDER_SITE_OTHER): Payer: Medicare Other | Admitting: Family Medicine

## 2019-06-02 ENCOUNTER — Other Ambulatory Visit: Payer: Self-pay

## 2019-06-02 VITALS — Ht 65.0 in | Wt 176.0 lb

## 2019-06-02 DIAGNOSIS — I10 Essential (primary) hypertension: Secondary | ICD-10-CM | POA: Diagnosis not present

## 2019-06-02 DIAGNOSIS — E1122 Type 2 diabetes mellitus with diabetic chronic kidney disease: Secondary | ICD-10-CM

## 2019-06-02 DIAGNOSIS — N183 Chronic kidney disease, stage 3 unspecified: Secondary | ICD-10-CM | POA: Diagnosis not present

## 2019-06-02 DIAGNOSIS — I6930 Unspecified sequelae of cerebral infarction: Secondary | ICD-10-CM

## 2019-06-02 MED ORDER — BLOOD GLUCOSE MONITOR KIT: PACK | 0 refills | Status: DC

## 2019-06-02 MED ORDER — LISINOPRIL 10 MG PO TABS: 10.0000 mg | ORAL_TABLET | Freq: Every day | ORAL | 1 refills | Status: DC

## 2019-06-02 NOTE — Assessment & Plan Note (Signed)
Above goal.  We will restart lisinopril.  She will have lab work in 7 days and a BP check at that time.

## 2019-06-02 NOTE — Assessment & Plan Note (Signed)
Above goal.  She is requesting a new meter and this will be faxed to her pharmacy.  She will see her endocrinologist later this week to help in management of her diabetes.  Urine microalbumin to be checked with next set of labs in 1 week.

## 2019-06-02 NOTE — Progress Notes (Signed)
Virtual Visit via telephone Note  This visit type was conducted due to national recommendations for restrictions regarding the COVID-19 pandemic (e.g. social distancing).  This format is felt to be most appropriate for this patient at this time.  All issues noted in this document were discussed and addressed.  No physical exam was performed (except for noted visual exam findings with Video Visits).   I connected with Brianna Dodson today at 11:30 AM EST by telephone and verified that I am speaking with the correct person using two identifiers. Location patient: home Location provider: work  Persons participating in the virtual visit: patient, provider  I discussed the limitations, risks, security and privacy concerns of performing an evaluation and management service by telephone and the availability of in person appointments. I also discussed with the patient that there may be a patient responsible charge related to this service. The patient expressed understanding and agreed to proceed.  Interactive audio and video telecommunications were attempted between this provider and patient, however failed, due to patient having technical difficulties OR patient did not have access to video capability.  We continued and completed visit with audio only.   Reason for visit: follow-up  HPI: HYPERTENSION  Disease Monitoring  Home BP Monitoring 159/72 Chest pain- no    Dyspnea- no Medications  Compliance-  Taking amlodipine, no longer on lisinopril, notes this was stopped during her most recent hospitalization. Appears to have been done to allow for permissive hypertension.  Edema- no  Diabetes: Has been ranging 151-190 on CBGs.  Taking glimepiride 3 mg daily.  No polyuria or polydipsia.  No hypoglycemia.  She saw ophthalmology in September.  She sees her endocrinologist later this week.  History of stroke: Notes her speech continues to improve.  No numbness or weakness.  She continues on Crestor,  aspirin, and Plavix.  She continues to follow with neurology.   ROS: See pertinent positives and negatives per HPI.  Past Medical History:  Diagnosis Date  . CKD (chronic kidney disease), stage III   . Diabetes mellitus without complication ([place])   . History of blood transfusion   . Hyperlipidemia   . Hypertension   . Stroke [facility])     Past Surgical History:  Procedure Laterality Date  . ABDOMINAL HYSTERECTOMY  1985  . TEE WITHOUT CARDIOVERSION N/A 04/14/2019   Procedure: TRANSESOPHAGEAL ECHOCARDIOGRAM (TEE);  Surgeon: Teodoro Spray, MD;  Location: [facility] ORS;  Service: Cardiovascular;  Laterality: N/A;    Family History  Problem Relation Age of Onset  . Stroke Mother   . Diabetes Mother   . Heart disease Father   . Kidney disease Sister   . Diabetes Sister   . Kidney disease Brother   . Heart disease Sister   . Diabetes Sister   . Diabetes Sister   . Diabetes Sister   . Kidney disease Sister   . Heart disease Sister   . Kidney disease Brother        kidney transplant  . Early death Brother 38       Truck Accident - died  . Heart disease Brother     SOCIAL HX: Non-smoker   Current Outpatient Medications:  .  ACCU-CHEK GUIDE test strip, USE TO CHECK BLOOD SUGARS TWICE DAILY. E11.9, Disp: 100 each, Rfl: 0 .  amLODipine (NORVASC) 10 MG tablet, TAKE 1 TABLET BY MOUTH EVERY DAY (Patient taking differently: Take 10 mg by mouth daily. ), Disp: 90 tablet, Rfl: 1 .  aspirin 81 MG  tablet, Take 81 mg by mouth daily., Disp: , Rfl:  .  blood glucose meter kit and supplies KIT, Dispense based on patient and insurance preference. Check CBGs two times daily. E11.9., Disp: 1 each, Rfl: 0 .  blood glucose meter kit and supplies, Dispense based on patient and insurance preference. Use up to four times daily as directed. E11.9, Disp: 1 each, Rfl: 0 .  clopidogrel (PLAVIX) 75 MG tablet, Take 1 tablet (75 mg total) by mouth daily., Disp: 30 tablet, Rfl: 3 .  glimepiride (AMARYL) 1 MG  tablet, TAKE 3 TABLETS (3 MG TOTAL) BY MOUTH DAILY WITH BREAKFAST., Disp: 270 tablet, Rfl: 1 .  glimepiride (AMARYL) 2 MG tablet, Take 2 mg by mouth every morning., Disp: , Rfl:  .  Lancets (ONETOUCH ULTRASOFT) lancets, Use as instructed, Disp: 100 each, Rfl: 6 .  rosuvastatin (CRESTOR) 40 MG tablet, TAKE 1 TABLET BY MOUTH EVERY DAY (Patient taking differently: Take 40 mg by mouth daily. ), Disp: 90 tablet, Rfl: 3 .  Vitamin D, Ergocalciferol, (DRISDOL) 50000 units CAPS capsule, Take 50,000 Units by mouth every 30 (thirty) days., Disp: , Rfl: 1 .  lisinopril (ZESTRIL) 10 MG tablet, Take 1 tablet (10 mg total) by mouth daily., Disp: 90 tablet, Rfl: 1  EXAM: This is a telehealth telephone visit and thus no physical exam was completed.  ASSESSMENT AND PLAN:  Discussed the following assessment and plan:  Essential hypertension Above goal.  We will restart lisinopril.  She will have lab work in 7 days and a BP check at that time.  DM type 2 (diabetes mellitus, type 2) Above goal.  She is requesting a new meter and this will be faxed to her pharmacy.  She will see her endocrinologist later this week to help in management of her diabetes.  Urine microalbumin to be checked with next set of labs in 1 week.  History of stroke with current residual effects She does continue to have some speech difficulty though she reports this has been improving.  She will continue her current regimen and continue to follow with neurology.  Continue risk factor management.    I discussed the assessment and treatment plan with the patient. The patient was provided an opportunity to ask questions and all were answered. The patient agreed with the plan and demonstrated an understanding of the instructions.   The patient was advised to call back or seek an in-person evaluation if the symptoms worsen or if the condition fails to improve as anticipated.  I provided 11 minutes of non-face-to-face time during this  encounter.   [name], MD

## 2019-06-02 NOTE — Assessment & Plan Note (Addendum)
She does continue to have some speech difficulty though she reports this has been improving.  She will continue her current regimen and continue to follow with neurology.  Continue risk factor management.

## 2019-06-05 DIAGNOSIS — I10 Essential (primary) hypertension: Secondary | ICD-10-CM | POA: Diagnosis not present

## 2019-06-05 DIAGNOSIS — E1165 Type 2 diabetes mellitus with hyperglycemia: Secondary | ICD-10-CM | POA: Diagnosis not present

## 2019-06-05 DIAGNOSIS — E1169 Type 2 diabetes mellitus with other specified complication: Secondary | ICD-10-CM | POA: Diagnosis not present

## 2019-06-05 DIAGNOSIS — E785 Hyperlipidemia, unspecified: Secondary | ICD-10-CM | POA: Diagnosis not present

## 2019-06-05 DIAGNOSIS — N184 Chronic kidney disease, stage 4 (severe): Secondary | ICD-10-CM | POA: Diagnosis not present

## 2019-06-06 NOTE — Telephone Encounter (Signed)
Patient has appointment with Dr. Manuella Ghazi 11/13 & has another upcoming appointment in January.

## 2019-06-11 ENCOUNTER — Other Ambulatory Visit: Payer: Self-pay

## 2019-06-11 ENCOUNTER — Ambulatory Visit: Payer: Commercial Indemnity

## 2019-06-11 ENCOUNTER — Telehealth: Payer: Self-pay | Admitting: Family Medicine

## 2019-06-11 ENCOUNTER — Other Ambulatory Visit (INDEPENDENT_AMBULATORY_CARE_PROVIDER_SITE_OTHER): Payer: Medicare Other

## 2019-06-11 DIAGNOSIS — I10 Essential (primary) hypertension: Secondary | ICD-10-CM

## 2019-06-11 DIAGNOSIS — E1122 Type 2 diabetes mellitus with diabetic chronic kidney disease: Secondary | ICD-10-CM

## 2019-06-11 DIAGNOSIS — N183 Chronic kidney disease, stage 3 unspecified: Secondary | ICD-10-CM

## 2019-06-11 LAB — BASIC METABOLIC PANEL
BUN: 30 mg/dL — ABNORMAL HIGH (ref 6–23)
CO2: 23 mEq/L (ref 19–32)
Calcium: 9.1 mg/dL (ref 8.4–10.5)
Chloride: 106 mEq/L (ref 96–112)
Creatinine, Ser: 2.17 mg/dL — ABNORMAL HIGH (ref 0.40–1.20)
GFR: 26.16 mL/min — ABNORMAL LOW (ref >60.00)
Glucose, Bld: 170 mg/dL — ABNORMAL HIGH (ref 70–99)
Potassium: 4.7 mEq/L (ref 3.5–5.1)
Sodium: 138 mEq/L (ref 135–145)

## 2019-06-11 LAB — MICROALBUMIN / CREATININE URINE RATIO
Creatinine,U: 93.6 mg/dL
Microalb Creat Ratio: 93.6 mg/g — ABNORMAL HIGH (ref 0.0–30.0)
Microalb, Ur: 87.6 mg/dL — ABNORMAL HIGH (ref 0.0–1.9)

## 2019-06-11 NOTE — Telephone Encounter (Signed)
Pt put herself back on metformin because her surgar was running.Her sister stated that Dr. Caryl Bis took her off the medication. Sister needs to know if patient should be on medication or not.

## 2019-06-11 NOTE — Telephone Encounter (Signed)
Pt put herself back on metformin because her surgar was running.Her sister stated that Dr. Caryl Bis took her off the medication. Sister needs to know if patient should be on medication or not. Please call her [phone].

## 2019-06-11 NOTE — Telephone Encounter (Signed)
Given her prior kidney function she should not be taking metformin.  Please find out what her sugar numbers have been.  Has she been taking her glimepiride?  Depending on what her sugar numbers have been we may need to consider adding an additional medication to the glimepiride.

## 2019-06-12 NOTE — Telephone Encounter (Signed)
The patient states her kidney doctor is who took her off of metformin and her sugars has been running in the 300's since she stopped she stated taht she has been taking metformin and cutting it in half and her sugars has been running now 120-130 she said she hs been taking the glimepiride the whole time and it is not working, she wants to go back on metformin a lower dose because it has been helping her sugars and she has been on it since 1999.  Please advise.  Jonnie Truxillo,cma

## 2019-06-12 NOTE — Telephone Encounter (Signed)
She should not take the metformin due to the level of her kidney dysfunction. There is a risk of lactic acidosis with taking metformin when kidney function is decreased to the level hers is and it can be quite dangerous if she develops this. I will not refill the metformin. I would suggest we try adding an injectable non-insulin for her diabetes if she is willing to start on that. She could also contact her endocrinologist to see what they would recommend.

## 2019-06-19 NOTE — Telephone Encounter (Signed)
LMTCB. Ok  for [facility] to advise.   The provider wanted the patient to know that She should not take the metformin due to the level of her kidney dysfunction. There is a risk of lactic acidosis with taking metformin when kidney function is decreased to the level hers is and it can be quite dangerous if she develops this. I will not refill the metformin. I would suggest we try adding an injectable non-insulin for her diabetes if she is willing to start on that. She could also contact her endocrinologist to see what they would recommend.  Dynastie Knoop,cma

## 2019-07-08 ENCOUNTER — Ambulatory Visit (INDEPENDENT_AMBULATORY_CARE_PROVIDER_SITE_OTHER): Payer: Medicare Other | Admitting: Family Medicine

## 2019-07-08 ENCOUNTER — Encounter: Payer: Self-pay | Admitting: Family Medicine

## 2019-07-08 ENCOUNTER — Other Ambulatory Visit: Payer: Self-pay

## 2019-07-08 DIAGNOSIS — I10 Essential (primary) hypertension: Secondary | ICD-10-CM | POA: Diagnosis not present

## 2019-07-08 DIAGNOSIS — N184 Chronic kidney disease, stage 4 (severe): Secondary | ICD-10-CM | POA: Diagnosis not present

## 2019-07-08 DIAGNOSIS — I6930 Unspecified sequelae of cerebral infarction: Secondary | ICD-10-CM | POA: Diagnosis not present

## 2019-07-08 DIAGNOSIS — N183 Chronic kidney disease, stage 3 unspecified: Secondary | ICD-10-CM

## 2019-07-08 DIAGNOSIS — E1122 Type 2 diabetes mellitus with diabetic chronic kidney disease: Secondary | ICD-10-CM | POA: Diagnosis not present

## 2019-07-08 NOTE — Assessment & Plan Note (Signed)
She will continue to see nephrology.  I encouraged avoiding NSAID use.  Discussed keeping her blood pressure under control and getting her diabetes under control as a means to prevent this from worsening further.

## 2019-07-08 NOTE — Assessment & Plan Note (Signed)
Speech has improved slightly.  Discussed risk factor management.  Continue aspirin and Plavix.  I advised that her being on these things would not preclude her from getting the COVID-19 vaccine.  Discussed that there may be some risk of a hematoma at the site of injection though I think she would be safe to proceed with the COVID-19 vaccine.

## 2019-07-08 NOTE — Progress Notes (Signed)
Virtual Visit via telephone Note  This visit type was conducted due to national recommendations for restrictions regarding the COVID-19 pandemic (e.g. social distancing).  This format is felt to be most appropriate for this patient at this time.  All issues noted in this document were discussed and addressed.  No physical exam was performed (except for noted visual exam findings with Video Visits).   I connected with Brianna Dodson today at 12:00 PM EST by telephone and verified that I am speaking with the correct person using two identifiers. Location patient: home Location provider: work  Persons participating in the virtual visit: patient, provider  I discussed the limitations, risks, security and privacy concerns of performing an evaluation and management service by telephone and the availability of in person appointments. I also discussed with the patient that there may be a patient responsible charge related to this service. The patient expressed understanding and agreed to proceed.  Interactive audio and video telecommunications were attempted between this provider and patient, however failed, due to patient having technical difficulties OR patient did not have access to video capability.  We continued and completed visit with audio only.   Reason for visit: Follow-up.  HPI: COVID-19 vaccine question: Patient notes she saw that maybe she should not get the vaccine given that she is on a blood thinner.  Diabetes: CBGs in the 200s.  Taking glimepiride.  No polyuria or polydipsia.  No hypoglycemia.  She does follow with endocrinology.  Patient is hesitant to take an injectable medication.  History of stroke: Currently on aspirin and Plavix as well as Crestor.  She does note some bruising in her arms when she bumps them though no bruising elsewhere.  Her speech is progressively improving.  No numbness, weakness, or vision changes.  CKD stage IV: She continues to follow with nephrology.   Her blood pressure has been well controlled at 127/70.  She is avoiding NSAIDs.   ROS: See pertinent positives and negatives per HPI.  Past Medical History:  Diagnosis Date  . CKD (chronic kidney disease), stage III   . Diabetes mellitus without complication ([place])   . History of blood transfusion   . Hyperlipidemia   . Hypertension   . Stroke [facility])     Past Surgical History:  Procedure Laterality Date  . ABDOMINAL HYSTERECTOMY  1985  . TEE WITHOUT CARDIOVERSION N/A 04/14/2019   Procedure: TRANSESOPHAGEAL ECHOCARDIOGRAM (TEE);  Surgeon: Teodoro Spray, MD;  Location: [facility] ORS;  Service: Cardiovascular;  Laterality: N/A;    Family History  Problem Relation Age of Onset  . Stroke Mother   . Diabetes Mother   . Heart disease Father   . Kidney disease Sister   . Diabetes Sister   . Kidney disease Brother   . Heart disease Sister   . Diabetes Sister   . Diabetes Sister   . Diabetes Sister   . Kidney disease Sister   . Heart disease Sister   . Kidney disease Brother        kidney transplant  . Early death Brother 34       Truck Accident - died  . Heart disease Brother     SOCIAL HX: Non-smoker   Current Outpatient Medications:  .  ACCU-CHEK GUIDE test strip, USE TO CHECK BLOOD SUGARS TWICE DAILY. E11.9, Disp: 100 each, Rfl: 0 .  amLODipine (NORVASC) 10 MG tablet, TAKE 1 TABLET BY MOUTH EVERY DAY (Patient taking differently: Take 10 mg by mouth daily. ), Disp: 90  tablet, Rfl: 1 .  aspirin 81 MG tablet, Take 81 mg by mouth daily., Disp: , Rfl:  .  blood glucose meter kit and supplies KIT, Dispense based on patient and insurance preference. Check CBGs two times daily. E11.9., Disp: 1 each, Rfl: 0 .  blood glucose meter kit and supplies, Dispense based on patient and insurance preference. Use up to four times daily as directed. E11.9, Disp: 1 each, Rfl: 0 .  clopidogrel (PLAVIX) 75 MG tablet, Take 1 tablet (75 mg total) by mouth daily., Disp: 30 tablet, Rfl: 3 .   glimepiride (AMARYL) 1 MG tablet, TAKE 3 TABLETS (3 MG TOTAL) BY MOUTH DAILY WITH BREAKFAST., Disp: 270 tablet, Rfl: 1 .  glimepiride (AMARYL) 2 MG tablet, Take 2 mg by mouth every morning., Disp: , Rfl:  .  Lancets (ONETOUCH ULTRASOFT) lancets, Use as instructed, Disp: 100 each, Rfl: 6 .  lisinopril (ZESTRIL) 10 MG tablet, Take 1 tablet (10 mg total) by mouth daily., Disp: 90 tablet, Rfl: 1 .  rosuvastatin (CRESTOR) 40 MG tablet, TAKE 1 TABLET BY MOUTH EVERY DAY (Patient taking differently: Take 40 mg by mouth daily. ), Disp: 90 tablet, Rfl: 3 .  Vitamin D, Ergocalciferol, (DRISDOL) 50000 units CAPS capsule, Take 50,000 Units by mouth every 30 (thirty) days., Disp: , Rfl: 1  EXAM: This was a telehealth telephone visit and thus no physical exam was completed.   ASSESSMENT AND PLAN:  Discussed the following assessment and plan:  Essential hypertension At goal.  Continue current regimen.  CKD stage 4 due to type 2 diabetes mellitus [facility]) She will continue to see nephrology.  I encouraged avoiding NSAID use.  Discussed keeping her blood pressure under control and getting her diabetes under control as a means to prevent this from worsening further.  DM type 2 (diabetes mellitus, type 2) Uncontrolled.  I advised that we start her on an injectable noninsulin such as Trulicity or Ozempic though she is hesitant to take injectable medication.  She wants to discuss this with her endocrinologist.  She will continue her glimepiride at this time.  History of stroke with current residual effects Speech has improved slightly.  Discussed risk factor management.  Continue aspirin and Plavix.  I advised that her being on these things would not preclude her from getting the COVID-19 vaccine.  Discussed that there may be some risk of a hematoma at the site of injection though I think she would be safe to proceed with the COVID-19 vaccine.   No orders of the defined types were placed in this  encounter.   No orders of the defined types were placed in this encounter.    I discussed the assessment and treatment plan with the patient. The patient was provided an opportunity to ask questions and all were answered. The patient agreed with the plan and demonstrated an understanding of the instructions.   The patient was advised to call back or seek an in-person evaluation if the symptoms worsen or if the condition fails to improve as anticipated.  I provided 11 minutes of non-face-to-face time during this encounter.   [name], MD

## 2019-07-08 NOTE — Assessment & Plan Note (Signed)
Uncontrolled.  I advised that we start her on an injectable noninsulin such as Trulicity or Ozempic though she is hesitant to take injectable medication.  She wants to discuss this with her endocrinologist.  She will continue her glimepiride at this time.

## 2019-07-08 NOTE — Assessment & Plan Note (Signed)
At goal. Continue current regimen. 

## 2019-07-09 DIAGNOSIS — N184 Chronic kidney disease, stage 4 (severe): Secondary | ICD-10-CM | POA: Diagnosis not present

## 2019-07-09 DIAGNOSIS — I129 Hypertensive chronic kidney disease with stage 1 through stage 4 chronic kidney disease, or unspecified chronic kidney disease: Secondary | ICD-10-CM | POA: Diagnosis not present

## 2019-07-09 DIAGNOSIS — N189 Chronic kidney disease, unspecified: Secondary | ICD-10-CM | POA: Diagnosis not present

## 2019-07-09 DIAGNOSIS — R809 Proteinuria, unspecified: Secondary | ICD-10-CM | POA: Diagnosis not present

## 2019-07-09 DIAGNOSIS — E1122 Type 2 diabetes mellitus with diabetic chronic kidney disease: Secondary | ICD-10-CM | POA: Diagnosis not present

## 2019-07-14 ENCOUNTER — Encounter (INDEPENDENT_AMBULATORY_CARE_PROVIDER_SITE_OTHER): Payer: Medicare Other | Admitting: Ophthalmology

## 2019-07-14 ENCOUNTER — Other Ambulatory Visit: Payer: Self-pay

## 2019-07-14 DIAGNOSIS — I10 Essential (primary) hypertension: Secondary | ICD-10-CM

## 2019-07-14 DIAGNOSIS — E113391 Type 2 diabetes mellitus with moderate nonproliferative diabetic retinopathy without macular edema, right eye: Secondary | ICD-10-CM | POA: Diagnosis not present

## 2019-07-14 DIAGNOSIS — E11311 Type 2 diabetes mellitus with unspecified diabetic retinopathy with macular edema: Secondary | ICD-10-CM | POA: Diagnosis not present

## 2019-07-14 DIAGNOSIS — E113312 Type 2 diabetes mellitus with moderate nonproliferative diabetic retinopathy with macular edema, left eye: Secondary | ICD-10-CM | POA: Diagnosis not present

## 2019-07-14 DIAGNOSIS — H43813 Vitreous degeneration, bilateral: Secondary | ICD-10-CM

## 2019-07-14 DIAGNOSIS — H35033 Hypertensive retinopathy, bilateral: Secondary | ICD-10-CM

## 2019-07-17 DIAGNOSIS — E1165 Type 2 diabetes mellitus with hyperglycemia: Secondary | ICD-10-CM | POA: Diagnosis not present

## 2019-07-17 DIAGNOSIS — E1169 Type 2 diabetes mellitus with other specified complication: Secondary | ICD-10-CM | POA: Diagnosis not present

## 2019-07-17 DIAGNOSIS — E785 Hyperlipidemia, unspecified: Secondary | ICD-10-CM | POA: Diagnosis not present

## 2019-07-17 DIAGNOSIS — N184 Chronic kidney disease, stage 4 (severe): Secondary | ICD-10-CM | POA: Diagnosis not present

## 2019-07-17 DIAGNOSIS — I10 Essential (primary) hypertension: Secondary | ICD-10-CM | POA: Diagnosis not present

## 2019-07-22 DIAGNOSIS — Z23 Encounter for immunization: Secondary | ICD-10-CM | POA: Diagnosis not present

## 2019-07-22 DIAGNOSIS — Z01818 Encounter for other preprocedural examination: Secondary | ICD-10-CM | POA: Diagnosis not present

## 2019-07-22 DIAGNOSIS — E785 Hyperlipidemia, unspecified: Secondary | ICD-10-CM | POA: Diagnosis not present

## 2019-07-22 DIAGNOSIS — I63431 Cerebral infarction due to embolism of right posterior cerebral artery: Secondary | ICD-10-CM | POA: Diagnosis not present

## 2019-07-22 DIAGNOSIS — I10 Essential (primary) hypertension: Secondary | ICD-10-CM | POA: Diagnosis not present

## 2019-07-24 ENCOUNTER — Telehealth: Payer: Self-pay | Admitting: Family Medicine

## 2019-07-24 NOTE — Telephone Encounter (Signed)
Please let the patient know that I got a reminder that Brianna Dodson was supposed to have an MRI of her left arm about a year ago.  This is to evaluate for a palpable lump in the arm near her shoulder.  Please see if that lump is still there.  Thanks.

## 2019-07-25 ENCOUNTER — Other Ambulatory Visit
Admission: RE | Admit: 2019-07-25 | Discharge: 2019-07-25 | Disposition: A | Discharge status: Home / Self Care | Payer: Medicare Other | Source: Ambulatory Visit | Attending: Physician Assistant | Admitting: Physician Assistant

## 2019-07-25 ENCOUNTER — Other Ambulatory Visit: Payer: Self-pay

## 2019-07-25 ENCOUNTER — Other Ambulatory Visit: Payer: Self-pay | Admitting: Family Medicine

## 2019-07-25 ENCOUNTER — Ambulatory Visit: Payer: Medicare Other | Attending: Internal Medicine

## 2019-07-25 DIAGNOSIS — Z20822 Contact with and (suspected) exposure to covid-19: Secondary | ICD-10-CM | POA: Diagnosis not present

## 2019-07-25 DIAGNOSIS — Z01812 Encounter for preprocedural laboratory examination: Secondary | ICD-10-CM | POA: Diagnosis not present

## 2019-07-25 DIAGNOSIS — Z23 Encounter for immunization: Secondary | ICD-10-CM

## 2019-07-25 LAB — SARS CORONAVIRUS 2 (TAT 6-24 HRS): SARS Coronavirus 2: NEGATIVE

## 2019-07-25 MED ORDER — CLOPIDOGREL BISULFATE 75 MG PO TABS: 75.0000 mg | ORAL_TABLET | Freq: Every day | ORAL | 3 refills | Status: DC

## 2019-07-25 NOTE — Telephone Encounter (Signed)
I calld and there is no voicemail will call back later to ask about lump on shoulder and MRI.  Lachrista Heslin,cma

## 2019-07-25 NOTE — Progress Notes (Signed)
   Covid-19 Vaccination Clinic  Name:  Brianna Dodson    MRN: [id number] DOB: 1934/02/09  07/25/2019  [name] was observed post Covid-19 immunization for 15 minutes without incidence. She was provided with Vaccine Information Sheet and instruction to access the V-Safe system.   [name] was instructed to call 911 with any severe reactions post vaccine: Marland Kitchen Difficulty breathing  . Swelling of your face and throat  . A fast heartbeat  . A bad rash all over your body  . Dizziness and weakness    Immunizations Administered    Name Date Dose VIS Date Route   Pfizer COVID-19 Vaccine 07/25/2019  9:05 AM 0.3 mL 06/13/2019 Intramuscular   Manufacturer: [place]   Lot: [id number]   [place]: [id number]

## 2019-07-29 MED ORDER — CEFAZOLIN SODIUM-DEXTROSE 2-4 GM/100ML-% IV SOLN: 2.0000 g | INTRAVENOUS | Status: DC

## 2019-07-30 ENCOUNTER — Ambulatory Visit
Admission: RE | Admit: 2019-07-30 | Discharge: 2019-07-30 | Disposition: A | Discharge status: Home / Self Care | Payer: Medicare Other | Attending: Cardiology | Admitting: Cardiology

## 2019-07-30 ENCOUNTER — Encounter: Payer: Self-pay | Admitting: Cardiology

## 2019-07-30 ENCOUNTER — Encounter
Admission: RE | Disposition: A | Discharge status: Home / Self Care | Payer: Self-pay | Source: Home / Self Care | Attending: Cardiology

## 2019-07-30 ENCOUNTER — Other Ambulatory Visit: Payer: Self-pay

## 2019-07-30 DIAGNOSIS — Z7984 Long term (current) use of oral hypoglycemic drugs: Secondary | ICD-10-CM | POA: Diagnosis not present

## 2019-07-30 DIAGNOSIS — I639 Cerebral infarction, unspecified: Secondary | ICD-10-CM | POA: Insufficient documentation

## 2019-07-30 DIAGNOSIS — Z7902 Long term (current) use of antithrombotics/antiplatelets: Secondary | ICD-10-CM | POA: Insufficient documentation

## 2019-07-30 DIAGNOSIS — E785 Hyperlipidemia, unspecified: Secondary | ICD-10-CM | POA: Diagnosis not present

## 2019-07-30 DIAGNOSIS — Z79899 Other long term (current) drug therapy: Secondary | ICD-10-CM | POA: Diagnosis not present

## 2019-07-30 DIAGNOSIS — I6389 Other cerebral infarction: Secondary | ICD-10-CM | POA: Diagnosis not present

## 2019-07-30 DIAGNOSIS — G4733 Obstructive sleep apnea (adult) (pediatric): Secondary | ICD-10-CM | POA: Insufficient documentation

## 2019-07-30 DIAGNOSIS — N184 Chronic kidney disease, stage 4 (severe): Secondary | ICD-10-CM | POA: Diagnosis not present

## 2019-07-30 DIAGNOSIS — Z7982 Long term (current) use of aspirin: Secondary | ICD-10-CM | POA: Diagnosis not present

## 2019-07-30 DIAGNOSIS — E1122 Type 2 diabetes mellitus with diabetic chronic kidney disease: Secondary | ICD-10-CM | POA: Insufficient documentation

## 2019-07-30 DIAGNOSIS — I129 Hypertensive chronic kidney disease with stage 1 through stage 4 chronic kidney disease, or unspecified chronic kidney disease: Secondary | ICD-10-CM | POA: Diagnosis not present

## 2019-07-30 DIAGNOSIS — Z95818 Presence of other cardiac implants and grafts: Secondary | ICD-10-CM

## 2019-07-30 HISTORY — PX: LOOP RECORDER INSERTION: EP1214

## 2019-07-30 HISTORY — DX: Presence of other cardiac implants and grafts: Z95.818

## 2019-07-30 LAB — GLUCOSE, CAPILLARY: Glucose-Capillary: 160 mg/dL — ABNORMAL HIGH (ref 70–99)

## 2019-07-30 SURGERY — LOOP RECORDER INSERTION
Anesthesia: LOCAL

## 2019-07-30 MED ORDER — LIDOCAINE-EPINEPHRINE (PF) 1 %-1:200000 IJ SOLN
INTRAMUSCULAR | Status: DC | PRN
  Administered 2019-07-30: 20 mL

## 2019-07-30 MED ORDER — SODIUM CHLORIDE 0.9 % IV SOLN: INTRAVENOUS | Status: DC

## 2019-07-30 MED ORDER — LIDOCAINE-EPINEPHRINE (PF) 1 %-1:200000 IJ SOLN
INTRAMUSCULAR | Status: AC
  Filled 2019-07-30: qty 20

## 2019-07-30 MED ORDER — SODIUM CHLORIDE 0.9 % IV SOLN
80.0000 mg | INTRAVENOUS | Status: DC
  Filled 2019-07-30: qty 2

## 2019-07-30 SURGICAL SUPPLY — 2 items
LOOP REVEAL LINQSYS (Prosthesis & Implant Heart) ×2 IMPLANT
PACK LOOP INSERTION (CUSTOM PROCEDURE TRAY) ×2 IMPLANT

## 2019-07-30 NOTE — Discharge Instructions (Signed)
Implantable Loop Recorder Placement, Care After This sheet gives you information about how to care for yourself after your procedure. Your health care provider may also give you more specific instructions. If you have problems or questions, contact your health care provider. What can I expect after the procedure? After the procedure, it is common to have:  Soreness or discomfort near the incision.  Some swelling or bruising near the incision. Follow these instructions at home: Incision care   Follow instructions from your health care provider about how to take care of your incision. Make sure you: ? Wash your hands with soap and water before you change your bandage (dressing). If soap and water are not available, use hand sanitizer. ? Change your dressing as told by your health care provider. ? Keep your dressing dry. ? Leave stitches (sutures), skin glue, or adhesive strips in place. These skin closures may need to stay in place for 2 weeks or longer. If adhesive strip edges start to loosen and curl up, you may trim the loose edges. Do not remove adhesive strips completely unless your health care provider tells you to do that.  Check your incision area every day for signs of infection. Check for: ? Redness, swelling, or pain. ? Fluid or blood. ? Warmth. ? Pus or a bad smell.  Do not take baths, swim, or use a hot tub until your health care provider approves. Ask your health care provider if you can take showers. Activity   Return to your normal activities as told by your health care provider. Ask your health care provider what activities are safe for you.  Do not drive for 24 hours if you were given a sedative during your procedure. General instructions  Follow instructions from your health care provider about how to manage your implantable loop recorder and transmit the information. Learn how to activate a recording if this is necessary for your type of device.  Do not go through  a metal detection gate, and do not let someone hold a metal detector over your chest. Show your ID card.  Do not have an MRI unless you check with your health care provider first.  Take over-the-counter and prescription medicines only as told by your health care provider.  Keep all follow-up visits as told by your health care provider. This is important. Contact a health care provider if:  You have redness, swelling, or pain around your incision.  You have a fever.  You have pain that is not relieved by your pain medicine.  You have triggered your device because of fainting (syncope) or because of a heartbeat that feels like it is racing, slow, fluttering, or skipping (palpitations). Get help right away if you have:  Chest pain.  Difficulty breathing. Summary  After the procedure, it is common to have soreness or discomfort near the incision.  Change your dressing as told by your health care provider.  Follow instructions from your health care provider about how to manage your implantable loop recorder and transmit the information.  Keep all follow-up visits as told by your health care provider. This is important. This information is not intended to replace advice given to you by your health care provider. Make sure you discuss any questions you have with your health care provider. Document Revised: 08/04/2017 Document Reviewed: 08/04/2017 Elsevier Patient Education  2020 [employer].

## 2019-08-01 ENCOUNTER — Other Ambulatory Visit: Payer: Self-pay | Admitting: Family Medicine

## 2019-08-01 ENCOUNTER — Ambulatory Visit: Payer: Commercial Indemnity | Admitting: Family Medicine

## 2019-08-06 DIAGNOSIS — Z95818 Presence of other cardiac implants and grafts: Secondary | ICD-10-CM | POA: Diagnosis not present

## 2019-08-06 DIAGNOSIS — I63431 Cerebral infarction due to embolism of right posterior cerebral artery: Secondary | ICD-10-CM | POA: Diagnosis not present

## 2019-08-06 DIAGNOSIS — I10 Essential (primary) hypertension: Secondary | ICD-10-CM | POA: Diagnosis not present

## 2019-08-07 DIAGNOSIS — Z95818 Presence of other cardiac implants and grafts: Secondary | ICD-10-CM | POA: Insufficient documentation

## 2019-08-07 NOTE — Telephone Encounter (Signed)
Noted  

## 2019-08-07 NOTE — Telephone Encounter (Signed)
I called the patient and she stated the lump the provider is speaking of for the MRI is gone and she will not need it.  Cordarrel Stiefel,cma

## 2019-08-15 ENCOUNTER — Ambulatory Visit: Payer: Medicare Other | Attending: Internal Medicine

## 2019-08-15 DIAGNOSIS — Z23 Encounter for immunization: Secondary | ICD-10-CM | POA: Insufficient documentation

## 2019-08-15 NOTE — Progress Notes (Signed)
   Covid-19 Vaccination Clinic  Name:  Rosangelica Pevehouse    MRN: [id number] DOB: 1934-03-29  08/15/2019  Ms. Schar was observed post Covid-19 immunization for 15 minutes without incidence. She was provided with Vaccine Information Sheet and instruction to access the V-Safe system.   Ms. Pitzer was instructed to call 911 with any severe reactions post vaccine: Marland Kitchen Difficulty breathing  . Swelling of your face and throat  . A fast heartbeat  . A bad rash all over your body  . Dizziness and weakness    Immunizations Administered    Name Date Dose VIS Date Route   Pfizer COVID-19 Vaccine 08/15/2019  8:30 AM 0.3 mL 06/13/2019 Intramuscular   Manufacturer: [place]   Lot: [id number]   [place]: [id number]

## 2019-08-20 ENCOUNTER — Inpatient Hospital Stay
Admission: EM | Admit: 2019-08-20 | Discharge: 2019-08-22 | DRG: 065 | Disposition: A | Discharge status: Home / Self Care | Payer: Medicare Other | Attending: Student | Admitting: Student

## 2019-08-20 ENCOUNTER — Emergency Department: Payer: Medicare Other

## 2019-08-20 ENCOUNTER — Inpatient Hospital Stay: Payer: Medicare Other

## 2019-08-20 ENCOUNTER — Telehealth: Payer: Self-pay | Admitting: Family Medicine

## 2019-08-20 ENCOUNTER — Other Ambulatory Visit: Payer: Self-pay

## 2019-08-20 DIAGNOSIS — R4781 Slurred speech: Secondary | ICD-10-CM | POA: Diagnosis not present

## 2019-08-20 DIAGNOSIS — Z7902 Long term (current) use of antithrombotics/antiplatelets: Secondary | ICD-10-CM

## 2019-08-20 DIAGNOSIS — D638 Anemia in other chronic diseases classified elsewhere: Secondary | ICD-10-CM | POA: Diagnosis present

## 2019-08-20 DIAGNOSIS — E1122 Type 2 diabetes mellitus with diabetic chronic kidney disease: Secondary | ICD-10-CM | POA: Diagnosis present

## 2019-08-20 DIAGNOSIS — I129 Hypertensive chronic kidney disease with stage 1 through stage 4 chronic kidney disease, or unspecified chronic kidney disease: Secondary | ICD-10-CM | POA: Diagnosis present

## 2019-08-20 DIAGNOSIS — Z7982 Long term (current) use of aspirin: Secondary | ICD-10-CM | POA: Diagnosis not present

## 2019-08-20 DIAGNOSIS — N184 Chronic kidney disease, stage 4 (severe): Secondary | ICD-10-CM | POA: Diagnosis present

## 2019-08-20 DIAGNOSIS — Z841 Family history of disorders of kidney and ureter: Secondary | ICD-10-CM

## 2019-08-20 DIAGNOSIS — Z8249 Family history of ischemic heart disease and other diseases of the circulatory system: Secondary | ICD-10-CM | POA: Diagnosis not present

## 2019-08-20 DIAGNOSIS — Z823 Family history of stroke: Secondary | ICD-10-CM

## 2019-08-20 DIAGNOSIS — E1151 Type 2 diabetes mellitus with diabetic peripheral angiopathy without gangrene: Secondary | ICD-10-CM | POA: Diagnosis present

## 2019-08-20 DIAGNOSIS — Z79899 Other long term (current) drug therapy: Secondary | ICD-10-CM

## 2019-08-20 DIAGNOSIS — I69334 Monoplegia of upper limb following cerebral infarction affecting left non-dominant side: Secondary | ICD-10-CM

## 2019-08-20 DIAGNOSIS — Z833 Family history of diabetes mellitus: Secondary | ICD-10-CM

## 2019-08-20 DIAGNOSIS — R4701 Aphasia: Secondary | ICD-10-CM | POA: Diagnosis present

## 2019-08-20 DIAGNOSIS — R1310 Dysphagia, unspecified: Secondary | ICD-10-CM | POA: Diagnosis present

## 2019-08-20 DIAGNOSIS — E785 Hyperlipidemia, unspecified: Secondary | ICD-10-CM | POA: Diagnosis present

## 2019-08-20 DIAGNOSIS — R29702 NIHSS score 2: Secondary | ICD-10-CM | POA: Diagnosis present

## 2019-08-20 DIAGNOSIS — N185 Chronic kidney disease, stage 5: Secondary | ICD-10-CM | POA: Diagnosis present

## 2019-08-20 DIAGNOSIS — Z6829 Body mass index (BMI) 29.0-29.9, adult: Secondary | ICD-10-CM

## 2019-08-20 DIAGNOSIS — I63511 Cerebral infarction due to unspecified occlusion or stenosis of right middle cerebral artery: Secondary | ICD-10-CM | POA: Diagnosis present

## 2019-08-20 DIAGNOSIS — Z90710 Acquired absence of both cervix and uterus: Secondary | ICD-10-CM | POA: Diagnosis not present

## 2019-08-20 DIAGNOSIS — D649 Anemia, unspecified: Secondary | ICD-10-CM | POA: Diagnosis present

## 2019-08-20 DIAGNOSIS — G9389 Other specified disorders of brain: Secondary | ICD-10-CM | POA: Diagnosis not present

## 2019-08-20 DIAGNOSIS — E119 Type 2 diabetes mellitus without complications: Secondary | ICD-10-CM

## 2019-08-20 DIAGNOSIS — E1129 Type 2 diabetes mellitus with other diabetic kidney complication: Secondary | ICD-10-CM

## 2019-08-20 DIAGNOSIS — I63233 Cerebral infarction due to unspecified occlusion or stenosis of bilateral carotid arteries: Secondary | ICD-10-CM | POA: Diagnosis not present

## 2019-08-20 DIAGNOSIS — Z20822 Contact with and (suspected) exposure to covid-19: Secondary | ICD-10-CM | POA: Diagnosis present

## 2019-08-20 DIAGNOSIS — E669 Obesity, unspecified: Secondary | ICD-10-CM | POA: Diagnosis present

## 2019-08-20 DIAGNOSIS — I639 Cerebral infarction, unspecified: Secondary | ICD-10-CM | POA: Diagnosis present

## 2019-08-20 DIAGNOSIS — I10 Essential (primary) hypertension: Secondary | ICD-10-CM | POA: Diagnosis not present

## 2019-08-20 DIAGNOSIS — D631 Anemia in chronic kidney disease: Secondary | ICD-10-CM | POA: Diagnosis present

## 2019-08-20 DIAGNOSIS — N186 End stage renal disease: Secondary | ICD-10-CM | POA: Diagnosis present

## 2019-08-20 DIAGNOSIS — I63411 Cerebral infarction due to embolism of right middle cerebral artery: Secondary | ICD-10-CM

## 2019-08-20 DIAGNOSIS — I340 Nonrheumatic mitral (valve) insufficiency: Secondary | ICD-10-CM | POA: Diagnosis not present

## 2019-08-20 HISTORY — DX: Cerebral infarction due to embolism of right middle cerebral artery: I63.411

## 2019-08-20 LAB — PROTIME-INR
INR: 1.1 (ref 0.8–1.2)
Prothrombin Time: 14 seconds (ref 11.4–15.2)

## 2019-08-20 LAB — GLUCOSE, CAPILLARY
Glucose-Capillary: 111 mg/dL — ABNORMAL HIGH (ref 70–99)
Glucose-Capillary: 93 mg/dL (ref 70–99)

## 2019-08-20 LAB — COMPREHENSIVE METABOLIC PANEL
ALT: 15 U/L (ref 0–44)
AST: 25 U/L (ref 15–41)
Albumin: 3.5 g/dL (ref 3.5–5.0)
Alkaline Phosphatase: 46 U/L (ref 38–126)
Anion gap: 10 (ref 5–15)
BUN: 23 mg/dL (ref 8–23)
CO2: 24 mmol/L (ref 22–32)
Calcium: 8.9 mg/dL (ref 8.9–10.3)
Chloride: 105 mmol/L (ref 98–111)
Creatinine, Ser: 2.26 mg/dL — ABNORMAL HIGH (ref 0.44–1.00)
GFR calc Af Amer: 23 mL/min — ABNORMAL LOW (ref >60)
GFR calc non Af Amer: 19 mL/min — ABNORMAL LOW (ref >60)
Glucose, Bld: 223 mg/dL — ABNORMAL HIGH (ref 70–99)
Potassium: 4.7 mmol/L (ref 3.5–5.1)
Sodium: 139 mmol/L (ref 135–145)
Total Bilirubin: 0.9 mg/dL (ref 0.3–1.2)
Total Protein: 7.1 g/dL (ref 6.5–8.1)

## 2019-08-20 LAB — DIFFERENTIAL
Abs Immature Granulocytes: 0.01 10*3/uL (ref 0.00–0.07)
Basophils Absolute: 0 10*3/uL (ref 0.0–0.1)
Basophils Relative: 1 %
Eosinophils Absolute: 0.1 10*3/uL (ref 0.0–0.5)
Eosinophils Relative: 3 %
Immature Granulocytes: 0 %
Lymphocytes Relative: 22 %
Lymphs Abs: 0.9 10*3/uL (ref 0.7–4.0)
Monocytes Absolute: 0.3 10*3/uL (ref 0.1–1.0)
Monocytes Relative: 8 %
Neutro Abs: 2.8 10*3/uL (ref 1.7–7.7)
Neutrophils Relative %: 66 %

## 2019-08-20 LAB — CBC
HCT: 29 % — ABNORMAL LOW (ref 36.0–46.0)
Hemoglobin: 9.7 g/dL — ABNORMAL LOW (ref 12.0–15.0)
MCH: 28.4 pg (ref 26.0–34.0)
MCHC: 33.4 g/dL (ref 30.0–36.0)
MCV: 85 fL (ref 80.0–100.0)
Platelets: 190 10*3/uL (ref 150–400)
RBC: 3.41 MIL/uL — ABNORMAL LOW (ref 3.87–5.11)
RDW: 13.1 % (ref 11.5–15.5)
WBC: 4.1 10*3/uL (ref 4.0–10.5)
nRBC: 0 % (ref 0.0–0.2)

## 2019-08-20 LAB — HEMOGLOBIN A1C
Hgb A1c MFr Bld: 8.1 % — ABNORMAL HIGH (ref 4.8–5.6)
Mean Plasma Glucose: 185.77 mg/dL

## 2019-08-20 LAB — RESPIRATORY PANEL BY RT PCR (FLU A&B, COVID)
Influenza A by PCR: NEGATIVE
Influenza B by PCR: NEGATIVE
SARS Coronavirus 2 by RT PCR: NEGATIVE

## 2019-08-20 LAB — APTT: aPTT: 33 seconds (ref 24–36)

## 2019-08-20 MED ORDER — INSULIN ASPART 100 UNIT/ML ~~LOC~~ SOLN
0.0000 [IU] | SUBCUTANEOUS | Status: DC
  Administered 2019-08-21: 5 [IU] via SUBCUTANEOUS
  Administered 2019-08-21: 2 [IU] via SUBCUTANEOUS
  Filled 2019-08-20 (×2): qty 1

## 2019-08-20 MED ORDER — HYDRALAZINE HCL 25 MG PO TABS: 25.0000 mg | ORAL_TABLET | Freq: Four times a day (QID) | ORAL | Status: DC | PRN

## 2019-08-20 MED ORDER — SODIUM CHLORIDE 0.9 % IV SOLN: INTRAVENOUS | Status: DC

## 2019-08-20 MED ORDER — SODIUM CHLORIDE 0.9% FLUSH
3.0000 mL | Freq: Once | INTRAVENOUS | Status: DC
Start: 2019-08-20 — End: 2019-08-22

## 2019-08-20 MED ORDER — STROKE: EARLY STAGES OF RECOVERY BOOK: Freq: Once | Status: DC

## 2019-08-20 MED ORDER — ENOXAPARIN SODIUM 40 MG/0.4ML ~~LOC~~ SOLN
30.0000 mg | SUBCUTANEOUS | Status: DC
  Administered 2019-08-21: 30 mg via SUBCUTANEOUS
  Filled 2019-08-20: qty 0.4

## 2019-08-20 MED ORDER — ASPIRIN EC 81 MG PO TBEC
81.0000 mg | DELAYED_RELEASE_TABLET | Freq: Every day | ORAL | Status: DC
  Administered 2019-08-21 – 2019-08-22 (×2): 81 mg via ORAL
  Filled 2019-08-20 (×2): qty 1

## 2019-08-20 MED ORDER — ACETAMINOPHEN 325 MG PO TABS: 650.0000 mg | ORAL_TABLET | ORAL | Status: DC | PRN

## 2019-08-20 MED ORDER — CLOPIDOGREL BISULFATE 75 MG PO TABS
75.0000 mg | ORAL_TABLET | Freq: Every day | ORAL | Status: DC
  Administered 2019-08-21 – 2019-08-22 (×2): 75 mg via ORAL
  Filled 2019-08-20 (×2): qty 1

## 2019-08-20 MED ORDER — ACETAMINOPHEN 650 MG RE SUPP: 650.0000 mg | RECTAL | Status: DC | PRN

## 2019-08-20 MED ORDER — ACETAMINOPHEN 160 MG/5ML PO SOLN
650.0000 mg | ORAL | Status: DC | PRN
  Filled 2019-08-20: qty 20.3

## 2019-08-20 MED ORDER — SENNOSIDES-DOCUSATE SODIUM 8.6-50 MG PO TABS: 1.0000 | ORAL_TABLET | Freq: Every evening | ORAL | Status: DC | PRN

## 2019-08-20 MED ORDER — ROSUVASTATIN CALCIUM 10 MG PO TABS
40.0000 mg | ORAL_TABLET | Freq: Every day | ORAL | Status: DC
  Administered 2019-08-21 – 2019-08-22 (×2): 40 mg via ORAL
  Filled 2019-08-20: qty 2
  Filled 2019-08-20 (×2): qty 4

## 2019-08-20 NOTE — Telephone Encounter (Signed)
Noted. Can you check later to make sure she went to the ED? Thanks.

## 2019-08-20 NOTE — ED Triage Notes (Addendum)
Pt comes via POV with sister from home with c/o left arm weakness. Pt states she had a stroke in Oct&Sept 2020. Pt states her speech is a little slurred and slow due to those strokes. Pt is A*OX4.  Pt states she was carrying her pulse and dropped it. Pt states she is having trouble carrying things in her left hand. She states it slids out. Pt states this started Saturday at the [place].  Pt denies any SOB, CP, headaches or blurred vision. Pt denies any recent falls.  Mini neuo negative, Van negative

## 2019-08-20 NOTE — Telephone Encounter (Signed)
Patient speech notably slurred on phone and complaint of left sided weakness, advised 911 patient refused but stated she would have caregiver drive her to the ER.

## 2019-08-20 NOTE — Telephone Encounter (Signed)
Patient's friend called, patient has some loss of speech, and weakness in left hand. Access nurse called, hold time too long. Advised patient's friend to call 911, she said ok.

## 2019-08-20 NOTE — Telephone Encounter (Signed)
Cannot see patient at Er called home no answer lef tvoicemail to call office.

## 2019-08-20 NOTE — H&P (Signed)
[facility] History and Physical   Patient: Brianna Dodson OZD:[id number]   PCP: [name], MD DOB: 03-14-1934   DOA: 08/20/2019   DOS: 08/20/2019   DOS: the patient was seen and examined on  Patient coming from: The patient is coming from Home  Chief Complaint: left Arm and Hand weakness  HPI: Brianna Dodson is a 86 y.o. female with Past medical history of CVA with residual left arm weakness, HTN, HLD, NIDDM, CKD stage IV, obesity, presented at [facility] ED complaining of left hand and arm weakness.  As per patient she had a stroke last year in September and October, her speech is little bit slurred and left arm weakness.  Patient stated that yesterday her speech got worse and she could not even say post office and today she dropped water from her left hand and pocketbook from her left hand so she came in for further evaluation.  Patient denied any chest pain or palpitations, no headache no dizziness, no shortness of breath.   ED Course: Vital signs are stable, temperature normal pulse 70-80, respiratory rate 14, BP 132/58 satting 97% on room air. Labs creatinine 2.26 baseline 2.17, GFR < 30, hemoglobin 9.7, coags within normal range CT Head: IMPRESSION: 1. Post ischemic changes within the right cerebral hemisphere appear progressed as compared to prior MRI 04/12/2019, most notably within the cortical/subcortical right frontal parietal lobes and mid to anterior right frontal lobe white matter. Some of these changes may be acute and MRI is recommended for further evaluation. 2. Redemonstrated background advanced chronic small vessel ischemic disease with moderate generalized parenchymal atrophy. 3. Frothy secretions within the right sphenoid sinus and posterior right ethmoid air cells. Correlate for acute sinusitis.   MRI brain pending   Review of Systems: as mentioned in the history of present illness.  All other systems reviewed and are negative.  Past Medical History:    Diagnosis Date  . CKD (chronic kidney disease), stage III   . Diabetes mellitus without complication ([place])   . History of blood transfusion   . Hyperlipidemia   . Hypertension   . Stroke [facility])    Past Surgical History:  Procedure Laterality Date  . ABDOMINAL HYSTERECTOMY  1985  . LOOP RECORDER INSERTION N/A 07/30/2019   Procedure: LOOP RECORDER INSERTION;  Surgeon: Isaias Cowman, MD;  Location: [place] CV LAB;  Service: Cardiovascular;  Laterality: N/A;  . TEE WITHOUT CARDIOVERSION N/A 04/14/2019   Procedure: TRANSESOPHAGEAL ECHOCARDIOGRAM (TEE);  Surgeon: Teodoro Spray, MD;  Location: [facility] ORS;  Service: Cardiovascular;  Laterality: N/A;   Social History:  reports that she has never smoked. She has never used smokeless tobacco. She reports that she does not drink alcohol or use drugs.  Allergies  Allergen Reactions  . Nsaids     CKD stage III - Avoid all nephrotoxic drugs    Family history reviewed and not pertinent Family History  Problem Relation Age of Onset  . Stroke Mother   . Diabetes Mother   . Heart disease Father   . Kidney disease Sister   . Diabetes Sister   . Kidney disease Brother   . Heart disease Sister   . Diabetes Sister   . Diabetes Sister   . Diabetes Sister   . Kidney disease Sister   . Heart disease Sister   . Kidney disease Brother        kidney transplant  . Early death Brother 49       Truck Accident - died  .  Heart disease Brother      Prior to Admission medications   Medication Sig Start Date End Date Taking? Authorizing Provider  ACCU-CHEK GUIDE test strip USE TO CHECK BLOOD SUGARS TWICE DAILY. E11.9 04/04/18   [name], MD  amLODipine (NORVASC) 10 MG tablet TAKE 1 TABLET BY MOUTH EVERY DAY Patient taking differently: Take 10 mg by mouth daily.  04/07/19   [name], MD  aspirin 81 MG tablet Take 81 mg by mouth daily at 12 noon.     [provider]  blood glucose meter kit and supplies KIT  Dispense based on patient and insurance preference. Check CBGs two times daily. E11.9. 06/02/19   [name], MD  blood glucose meter kit and supplies Dispense based on patient and insurance preference. Use up to four times daily as directed. E11.9 09/14/16   Coral Spikes, DO  clopidogrel (PLAVIX) 75 MG tablet TAKE ONE TABLET BY MOUTH DAILY Patient taking differently: Take 75 mg by mouth daily.  07/28/19   [name], MD  clopidogrel (PLAVIX) 75 MG tablet Take 1 tablet (75 mg total) by mouth daily. 07/25/19   [name], MD  Ferrous Sulfate (IRON) 28 MG TABS Take 28 mg by mouth daily at 12 noon.    [provider]  glimepiride (AMARYL) 1 MG tablet TAKE 3 TABLETS (3 MG TOTAL) BY MOUTH DAILY WITH BREAKFAST. 05/12/19   [name], MD  glimepiride (AMARYL) 2 MG tablet TAKE 1 TABLET BY MOUTH DAILY WITH BREAKFAST 08/06/19   [name], MD  Lancets [facility] ULTRASOFT) lancets Use as instructed 09/14/16   Coral Spikes, DO  lisinopril (ZESTRIL) 10 MG tablet Take 1 tablet (10 mg total) by mouth daily. Patient taking differently: Take 10 mg by mouth daily at 12 noon.  06/02/19   [name], MD  rosuvastatin (CRESTOR) 40 MG tablet TAKE 1 TABLET BY MOUTH EVERY DAY Patient taking differently: Take 40 mg by mouth daily.  08/28/18   [name], MD  Vitamin D, Ergocalciferol, (DRISDOL) 50000 units CAPS capsule Take 50,000 Units by mouth every 30 (thirty) days. 10/05/17   [provider]    Physical Exam: Vitals:   08/20/19 1340 08/20/19 1548  BP: 102/76 (!) 132/58  Pulse: 80 70  Resp:  14  Temp: 98.7 F (37.1 C)   SpO2: 100% 97%  Weight: 81.6 kg   Height: 5' 5"  (1.651 m)     General: alert and oriented to time, place, and person. Appear in no distress, affect appropriate Eyes: PERRLA, Conjunctiva normal ENT: Oral Mucosa Clear, moist  Neck: no JVD, no Abnormal Mass Or lumps Cardiovascular: S1 and S2 Present, no Murmur, peripheral  pulses symmetrical Respiratory: good respiratory effort, Bilateral Air entry equal and Decreased, no signs of accessory muscle use, Clear to Auscultation, no Crackles, no wheezes Abdomen: Bowel Sound present, Soft and no tenderness, no hernia Skin: no rashes  Extremities: no Pedal edema, no calf tenderness Neurologic: mental status, alert and oriented x3, left upper extremity power 4 x 5, rest within normal limits. Gait not checked due to patient safety concerns  Data Reviewed: I have personally reviewed and interpreted labs, imaging as discussed below.  CBC: Recent Labs  Lab 08/20/19 1345  WBC 4.1  NEUTROABS 2.8  HGB 9.7*  HCT 29.0*  MCV 85.0  PLT 549   Basic Metabolic Panel: Recent Labs  Lab 08/20/19 1345  NA 139  K 4.7  CL 105  CO2 24  GLUCOSE 223*  BUN 23  CREATININE 2.26*  CALCIUM 8.9   GFR: Estimated Creatinine Clearance: 19.9 mL/min (A) (by C-G formula based on SCr of 2.26 mg/dL (H)). Liver Function Tests: Recent Labs  Lab 08/20/19 1345  AST 25  ALT 15  ALKPHOS 46  BILITOT 0.9  PROT 7.1  ALBUMIN 3.5   No results for input(s): LIPASE, AMYLASE in the last 168 hours. No results for input(s): AMMONIA in the last 168 hours. Coagulation Profile: Recent Labs  Lab 08/20/19 1345  INR 1.1   Cardiac Enzymes: No results for input(s): CKTOTAL, CKMB, CKMBINDEX, TROPONINI in the last 168 hours. BNP (last 3 results) No results for input(s): PROBNP in the last 8760 hours. HbA1C: No results for input(s): HGBA1C in the last 72 hours. CBG: No results for input(s): GLUCAP in the last 168 hours. Lipid Profile: No results for input(s): CHOL, HDL, LDLCALC, TRIG, CHOLHDL, LDLDIRECT in the last 72 hours. Thyroid Function Tests: No results for input(s): TSH, T4TOTAL, FREET4, T3FREE, THYROIDAB in the last 72 hours. Anemia Panel: No results for input(s): VITAMINB12, FOLATE, FERRITIN, TIBC, IRON, RETICCTPCT in the last 72 hours. Urine analysis:    Component Value  Date/Time   COLORURINE YELLOW (A) 03/15/2019 1256   APPEARANCEUR CLOUDY (A) 03/15/2019 1256   APPEARANCEUR Clear 11/09/2013 1902   LABSPEC 1.014 03/15/2019 1256   LABSPEC 1.011 11/09/2013 1902   PHURINE 6.0 03/15/2019 1256   GLUCOSEU 50 (A) 03/15/2019 1256   GLUCOSEU >=1000 (A) 01/06/2016 0923   HGBUR NEGATIVE 03/15/2019 1256   BILIRUBINUR NEGATIVE 03/15/2019 1256   BILIRUBINUR Negative 11/09/2013 1902   KETONESUR NEGATIVE 03/15/2019 1256   PROTEINUR 100 (A) 03/15/2019 1256   UROBILINOGEN 0.2 01/06/2016 0923   NITRITE NEGATIVE 03/15/2019 1256   LEUKOCYTESUR TRACE (A) 03/15/2019 1256   LEUKOCYTESUR Trace 11/09/2013 1902    Radiological Exams on Admission: CT HEAD WO CONTRAST  Result Date: 08/20/2019 CLINICAL DATA:  Possible stroke, focal neuro deficit, greater than 6 hours, stroke suspected. Additional history provided: Left arm weakness, previous stroke in October and September 2020, residual slurred speech EXAM: CT HEAD WITHOUT CONTRAST TECHNIQUE: Contiguous axial images were obtained from the base of the skull through the vertex without intravenous contrast. COMPARISON:  Brain MRI 04/12/2019 FINDINGS: Brain: There is no evidence of acute intracranial hemorrhage. Again demonstrated is patchy cortical and subcortical hypodensity within the right frontoparietal lobes and portions of the posterior right temporal lobe. Some of this abnormal hypodensity corresponds with the infarcts which were acute at time of prior MRI 04/12/2019. However, some of these ischemic changes appear progressed as compared to this prior exam. For instance, within the cortical and subcortical right frontoparietal lobe (series 2, images 17-21) and within the mid to anterior right frontal lobe white matter. Redemonstrated small chronic cortical infarct within the inferior right parietal lobe (series 2, image 19). Background advanced chronic small vessel ischemic disease. Moderate generalized parenchymal atrophy. There is  no evidence of intracranial mass. No midline shift or extra-axial fluid collection Vascular: No hyperdense vessel.  Atherosclerotic calcifications. Skull: No acute bony abnormality. Sinuses/Orbits: Visualized orbits demonstrate no acute abnormality. Frothy secretions within the right sphenoid sinus and posterior right ethmoid air cells. No significant mastoid effusion at the imaged levels. IMPRESSION: 1. Post ischemic changes within the right cerebral hemisphere appear progressed as compared to prior MRI 04/12/2019, most notably within the cortical/subcortical right frontal parietal lobes and mid to anterior right frontal lobe white matter. Some of these changes may be acute  and MRI is recommended for further evaluation. 2. Redemonstrated background advanced chronic small vessel ischemic disease with moderate generalized parenchymal atrophy. 3. Frothy secretions within the right sphenoid sinus and posterior right ethmoid air cells. Correlate for acute sinusitis. Electronically Signed   By: Kellie Simmering DO   On: 08/20/2019 14:29   EKG: pending  Echocardiogram: Previous echo showed EF 6065%, negative for AV shunt Repeat echocardiogram pending  I reviewed all nursing notes, pharmacy notes, vitals, pertinent old records.  Assessment/Plan  Principal problem   CVA (cerebral vascular accident) ([place]) Active Problems: NIDDM type II Hypertension Hyperlipidemia CKD stage IV    #CVA, possible acute stroke, evolving changes  CT Head: Post ischemic changes within the right cerebral hemisphere appear progressed as compared to prior MRI 04/12/2019, most notably within the cortical/subcortical right frontal parietal lobes and mid to anterior right frontal lobe white matter. Some of these changes may be acute and MRI is recommended for further evaluation.  Started aspirin 81 mg and Plavix 75 mg, continued Crestor 40 mg daily PTA dose Continue to monitor on telemetry, neurochecks as per protocol Continue  fall precautions and aspiration precautions Follow PT/OT eval SLP eval for dysphagia Resume diet if no concern for aspiration Follow MRI brain, 2D echocardiogram and carotid duplex Follow neurology consult for further recommend  # HTN, HLD continue with Crestor Held amlodipine and lisinopril Blood pressure soft, use hydralazine as needed Allow permissive hypertension for 24 to 48 hours due to possibility of acute stroke Continue to monitor BP and titrate medications accordingly  # NIDDM T2, held home medications for now Started NovoLog sliding scale Monitor FSBG and titrate insulin dose accordingly  #CKD stage IV, creatinine slightly elevated 2.26 on admission Baseline creatinine 2.17 Started gentle hydration Continue to monitor urine output and renal function  #Anemia of chronic disease, hemoglobin 9.7, stable continue to monitor   Nutrition: Carb modified diet DVT Prophylaxis: Subcutaneous Lovenox  Advance goals of care discussion: Full code   Consults: Neurology, we will call in a.m.    Family Communication: No family member was available at bedside, at the time of interview.  Opportunity was given to ask question and all questions were answered satisfactorily.  Disposition: Admitted as inpatient, med-surge unit on telemetry Likely to be discharged Home mat need Home PT/OT and HHA, in 2-3 days days   I have discussed plan of care as described above with RN and patient/family.  Severity of Illness: The appropriate patient status for this patient is INPATIENT. Inpatient status is judged to be reasonable and necessary in order to provide the required intensity of service to ensure the patient's safety. The patient's presenting symptoms, physical exam findings, and initial radiographic and laboratory data in the context of their chronic comorbidities is felt to place them at high risk for further clinical deterioration. Furthermore, it is not anticipated that the patient will  be medically stable for discharge from the hospital within 2 midnights of admission. The following factors support the patient status of inpatient.   " The patient's presenting symptoms include CVA. " The worrisome physical exam findings include worsening of weakness and possible arrhythmias. " The initial radiographic and laboratory data are worrisome because of evolving CVA, possible acute stroke. " The chronic co-morbidities include CVA, hypertension and diabetes.   * I certify that at the point of admission it is my clinical judgment that the patient will require inpatient hospital care spanning beyond 2 midnights from the point of admission due to high intensity  of service, high risk for further deterioration and high frequency of surveillance required.*    Author: Val Riles, MD [facility] 08/20/2019 4:57 PM   To reach On-call, see care teams to locate the attending and reach out to them via [email or web address]. If 7PM-7AM, please contact night-coverage If you still have difficulty reaching the attending provider, please page the [facility] (Director on Call) for [facility] on amion for assistance.

## 2019-08-20 NOTE — ED Notes (Signed)
Patient off unit to MRI.

## 2019-08-20 NOTE — ED Provider Notes (Addendum)
[facility] Emergency Department Provider Note       Time seen: ----------------------------------------- 3:22 PM on 08/20/2019 -----------------------------------------   I have reviewed the triage vital signs and the nursing notes.  HISTORY   Chief Complaint Possible Stroke    HPI Brianna Dodson is a 86 y.o. female with a history of chronic kidney disease, diabetes, hyperlipidemia, hypertension, CVA who presents to the ED for left arm weakness.  Patient states she had a stroke in September and October of last year.  States her speech is a little slurred and slow due to the strokes that she had in the past.  She is having trouble caring things in her left hand.  This started Saturday.  She denies any recent illness or other complaints.  Past Medical History:  Diagnosis Date  . CKD (chronic kidney disease), stage III   . Diabetes mellitus without complication ([place])   . History of blood transfusion   . Hyperlipidemia   . Hypertension   . Stroke [facility])     Patient Active Problem List   Diagnosis Date Noted  . Anemia 04/18/2019  . Aphasia 04/12/2019  . History of stroke with current residual effects 03/15/2019  . Positional numbness and tingling in both hands, ulnar aspect 09/19/2018  . Contusion of left shoulder 04/11/2018  . Itching 10/23/2017  . Nevus 04/17/2017  . CKD stage 4 due to type 2 diabetes mellitus ([place]) 02/02/2015  . Vitamin D deficiency 02/02/2015  . Obesity (BMI 30-39.9) 08/03/2014  . DM type 2 (diabetes mellitus, type 2) ([place]) 01/29/2014  . Essential hypertension 01/13/2014  . HLD (hyperlipidemia) 01/13/2014    Past Surgical History:  Procedure Laterality Date  . ABDOMINAL HYSTERECTOMY  1985  . LOOP RECORDER INSERTION N/A 07/30/2019   Procedure: LOOP RECORDER INSERTION;  Surgeon: Isaias Cowman, MD;  Location: [place] CV LAB;  Service: Cardiovascular;  Laterality: N/A;  . TEE WITHOUT CARDIOVERSION N/A 04/14/2019    Procedure: TRANSESOPHAGEAL ECHOCARDIOGRAM (TEE);  Surgeon: Teodoro Spray, MD;  Location: [facility] ORS;  Service: Cardiovascular;  Laterality: N/A;    Allergies Nsaids  Social History Social History   Tobacco Use  . Smoking status: Never Smoker  . Smokeless tobacco: Never Used  Substance Use Topics  . Alcohol use: No  . Drug use: No    Review of Systems Constitutional: Negative for fever. Cardiovascular: Negative for chest pain. Respiratory: Negative for shortness of breath. Gastrointestinal: Negative for abdominal pain, vomiting and diarrhea. Musculoskeletal: Negative for back pain. Skin: Negative for rash. Neurological: Positive for left arm weakness  All systems negative/normal/unremarkable except as stated in the HPI  ____________________________________________   PHYSICAL EXAM:  VITAL SIGNS: ED Triage Vitals [08/20/19 1340]  Enc Vitals Group     BP 102/76     Pulse Rate 80     Resp      Temp 98.7 F (37.1 C)     Temp src      SpO2 100 %     Weight 180 lb (81.6 kg)     Height 5\' 5"  (1.651 m)     Head Circumference      Peak Flow      Pain Score 0     Pain Loc      Pain Edu?      Excl. in [place]?     Constitutional: Alert and oriented. Well appearing and in no distress. Eyes: Conjunctivae are normal. Normal extraocular movements. ENT      Head: Normocephalic and atraumatic.  Nose: No congestion/rhinnorhea.      Mouth/Throat: Mucous membranes are moist.      Neck: No stridor. Cardiovascular: Normal rate, regular rhythm. No murmurs, rubs, or gallops. Respiratory: Normal respiratory effort without tachypnea nor retractions. Breath sounds are clear and equal bilaterally. No wheezes/rales/rhonchi. Gastrointestinal: Soft and nontender. Normal bowel sounds Musculoskeletal: Nontender with normal range of motion in extremities. No lower extremity tenderness nor edema. Neurologic: Dysarthria is noted, left hand weakness compared to right, slight pronator drift is  noted on the left, normal finger-to-nose testing, no sensory deficits. No other gross focal neurologic deficits are appreciated.  Skin:  Skin is warm, dry and intact. No rash noted. Psychiatric: Mood and affect are normal. Speech and behavior are normal.  ____________________________________________  EKG: Interpreted by me.  Sinus rhythm rate of 74 bpm, left axis deviation, T wave abnormality, normal QT  ____________________________________________  ED COURSE:  As part of my medical decision making, I reviewed the following data within the [place] History obtained from family if available, nursing notes, old chart and ekg, as well as notes from prior ED visits. Patient presented for strokelike symptoms, we will assess with labs and imaging as indicated at this time.   Procedures  Brianna Dodson was evaluated in Emergency Department on 08/20/2019 for the symptoms described in the history of present illness. She was evaluated in the context of the global COVID-19 pandemic, which necessitated consideration that the patient might be at risk for infection with the SARS-CoV-2 virus that causes COVID-19. Institutional protocols and algorithms that pertain to the evaluation of patients at risk for COVID-19 are in a state of rapid change based on information released by regulatory bodies including the CDC and federal and state organizations. These policies and algorithms were followed during the patient's care in the ED.  ____________________________________________   LABS (pertinent positives/negatives)  Labs Reviewed  CBC - Abnormal; Notable for the following components:      Result Value   RBC 3.41 (*)    Hemoglobin 9.7 (*)    HCT 29.0 (*)    All other components within normal limits  COMPREHENSIVE METABOLIC PANEL - Abnormal; Notable for the following components:   Glucose, Bld 223 (*)    Creatinine, Ser 2.26 (*)    GFR calc non Af Amer 19 (*)    GFR calc Af Amer 23 (*)     All other components within normal limits  PROTIME-INR  APTT  DIFFERENTIAL  CBG MONITORING, ED  I-STAT CREATININE, ED    RADIOLOGY  CT head  IMPRESSION: 1. Post ischemic changes within the right cerebral hemisphere appear progressed as compared to prior MRI 04/12/2019, most notably within the cortical/subcortical right frontal parietal lobes and mid to anterior right frontal lobe white matter. Some of these changes may be acute and MRI is recommended for further evaluation. 2. Redemonstrated background advanced chronic small vessel ischemic disease with moderate generalized parenchymal atrophy. 3. Frothy secretions within the right sphenoid sinus and posterior right ethmoid air cells. Correlate for acute sinusitis.   ____________________________________________   DIFFERENTIAL DIAGNOSIS   CVA, TIA, hemorrhage, complex migraine  FINAL ASSESSMENT AND PLAN  CVA   Plan: The patient had presented for left arm weakness. Patient's labs did not reveal any acute process. Patient's imaging revealed concerning findings for progressive ischemia when compared to prior MRI.  Patient will need repeat MRI and hospital admission.  There are also signs of sinusitis.   Laurence Aly, MD    Note:  This note was generated in part or whole with voice recognition software. Voice recognition is usually quite accurate but there are transcription errors that can and very often do occur. I apologize for any typographical errors that were not detected and corrected.     Earleen Newport, MD 08/20/19 1525    Earleen Newport, MD 08/20/19 [phone]

## 2019-08-21 ENCOUNTER — Inpatient Hospital Stay: Payer: Medicare Other

## 2019-08-21 ENCOUNTER — Inpatient Hospital Stay (HOSPITAL_COMMUNITY)
Admit: 2019-08-21 | Discharge: 2019-08-21 | Disposition: A | Discharge status: Home / Self Care | Payer: Medicare Other | Attending: Student | Admitting: Student

## 2019-08-21 DIAGNOSIS — I340 Nonrheumatic mitral (valve) insufficiency: Secondary | ICD-10-CM

## 2019-08-21 DIAGNOSIS — I639 Cerebral infarction, unspecified: Secondary | ICD-10-CM

## 2019-08-21 DIAGNOSIS — I10 Essential (primary) hypertension: Secondary | ICD-10-CM

## 2019-08-21 LAB — BASIC METABOLIC PANEL
Anion gap: 6 (ref 5–15)
BUN: 24 mg/dL — ABNORMAL HIGH (ref 8–23)
CO2: 25 mmol/L (ref 22–32)
Calcium: 8.3 mg/dL — ABNORMAL LOW (ref 8.9–10.3)
Chloride: 110 mmol/L (ref 98–111)
Creatinine, Ser: 2.01 mg/dL — ABNORMAL HIGH (ref 0.44–1.00)
GFR calc Af Amer: 26 mL/min — ABNORMAL LOW (ref >60)
GFR calc non Af Amer: 22 mL/min — ABNORMAL LOW (ref >60)
Glucose, Bld: 117 mg/dL — ABNORMAL HIGH (ref 70–99)
Potassium: 4.6 mmol/L (ref 3.5–5.1)
Sodium: 141 mmol/L (ref 135–145)

## 2019-08-21 LAB — CBC
HCT: 24.9 % — ABNORMAL LOW (ref 36.0–46.0)
Hemoglobin: 8.4 g/dL — ABNORMAL LOW (ref 12.0–15.0)
MCH: 28.3 pg (ref 26.0–34.0)
MCHC: 33.7 g/dL (ref 30.0–36.0)
MCV: 83.8 fL (ref 80.0–100.0)
Platelets: 156 10*3/uL (ref 150–400)
RBC: 2.97 MIL/uL — ABNORMAL LOW (ref 3.87–5.11)
RDW: 13 % (ref 11.5–15.5)
WBC: 4.1 10*3/uL (ref 4.0–10.5)
nRBC: 0 % (ref 0.0–0.2)

## 2019-08-21 LAB — GLUCOSE, CAPILLARY
Glucose-Capillary: 115 mg/dL — ABNORMAL HIGH (ref 70–99)
Glucose-Capillary: 117 mg/dL — ABNORMAL HIGH (ref 70–99)
Glucose-Capillary: 131 mg/dL — ABNORMAL HIGH (ref 70–99)
Glucose-Capillary: 132 mg/dL — ABNORMAL HIGH (ref 70–99)
Glucose-Capillary: 157 mg/dL — ABNORMAL HIGH (ref 70–99)
Glucose-Capillary: 201 mg/dL — ABNORMAL HIGH (ref 70–99)
Glucose-Capillary: 94 mg/dL (ref 70–99)

## 2019-08-21 LAB — MAGNESIUM: Magnesium: 2.1 mg/dL (ref 1.7–2.4)

## 2019-08-21 LAB — ECHOCARDIOGRAM COMPLETE
Height: 65 in
Weight: 2873.6 oz

## 2019-08-21 LAB — LIPID PANEL
Cholesterol: 172 mg/dL (ref 0–200)
HDL: 29 mg/dL — ABNORMAL LOW (ref >40)
LDL Cholesterol: 112 mg/dL — ABNORMAL HIGH (ref 0–99)
Total CHOL/HDL Ratio: 5.9 RATIO
Triglycerides: 154 mg/dL — ABNORMAL HIGH (ref <150)
VLDL: 31 mg/dL (ref 0–40)

## 2019-08-21 LAB — TSH: TSH: 1.031 u[IU]/mL (ref 0.350–4.500)

## 2019-08-21 LAB — PHOSPHORUS: Phosphorus: 3.7 mg/dL (ref 2.5–4.6)

## 2019-08-21 LAB — HEMOGLOBIN A1C
Hgb A1c MFr Bld: 7.5 % — ABNORMAL HIGH (ref 4.8–5.6)
Mean Plasma Glucose: 168.55 mg/dL

## 2019-08-21 MED ORDER — CARVEDILOL 6.25 MG PO TABS: 6.2500 mg | ORAL_TABLET | Freq: Two times a day (BID) | ORAL | Status: DC

## 2019-08-21 MED ORDER — AMLODIPINE BESYLATE 10 MG PO TABS: 10.0000 mg | ORAL_TABLET | Freq: Every day | ORAL | Status: DC

## 2019-08-21 MED ORDER — LISINOPRIL 10 MG PO TABS
10.0000 mg | ORAL_TABLET | Freq: Every day | ORAL | Status: DC
  Administered 2019-08-21 – 2019-08-22 (×2): 10 mg via ORAL
  Filled 2019-08-21 (×2): qty 1

## 2019-08-21 MED ORDER — AMLODIPINE BESYLATE 10 MG PO TABS
10.0000 mg | ORAL_TABLET | Freq: Every day | ORAL | Status: DC
  Administered 2019-08-21 – 2019-08-22 (×2): 10 mg via ORAL
  Filled 2019-08-21 (×2): qty 1

## 2019-08-21 NOTE — Evaluation (Signed)
Speech Language Pathology Evaluation Patient Details Name: Brianna Dodson MRN: [id number] DOB: 05/15/1934 Today's Date: 08/21/2019 Time: 6010-9323 SLP Time Calculation (min) (ACUTE ONLY): 35 min  Problem List:  Patient Active Problem List   Diagnosis Date Noted  . CVA (cerebral vascular accident) ([place]) 08/20/2019  . Anemia 04/18/2019  . Aphasia 04/12/2019  . History of stroke with current residual effects 03/15/2019  . Positional numbness and tingling in both hands, ulnar aspect 09/19/2018  . Contusion of left shoulder 04/11/2018  . Itching 10/23/2017  . Nevus 04/17/2017  . CKD stage 4 due to type 2 diabetes mellitus ([place]) 02/02/2015  . Vitamin D deficiency 02/02/2015  . Obesity (BMI 30-39.9) 08/03/2014  . DM type 2 (diabetes mellitus, type 2) ([place]) 01/29/2014  . Essential hypertension 01/13/2014  . HLD (hyperlipidemia) 01/13/2014   Past Medical History:  Past Medical History:  Diagnosis Date  . CKD (chronic kidney disease), stage III   . Diabetes mellitus without complication ([place])   . History of blood transfusion   . Hyperlipidemia   . Hypertension   . Stroke [facility])    Past Surgical History:  Past Surgical History:  Procedure Laterality Date  . ABDOMINAL HYSTERECTOMY  1985  . LOOP RECORDER INSERTION N/A 07/30/2019   Procedure: LOOP RECORDER INSERTION;  Surgeon: Isaias Cowman, MD;  Location: [facility];  Service: Cardiovascular;  Laterality: N/A;  . TEE WITHOUT CARDIOVERSION N/A 04/14/2019   Procedure: TRANSESOPHAGEAL ECHOCARDIOGRAM (TEE);  Surgeon: Teodoro Spray, MD;  Location: [facility] ORS;  Service: Cardiovascular;  Laterality: N/A;   HPI:  Pt is a 86 y.o. female with Past medical history of CVA with residual left arm weakness, HTN, HLD, NIDDM, CKD stage IV, obesity, presented at [facility] ED complaining of left hand and arm weakness.  As per patient she had a stroke last year in September and October, her speech is little bit slurred and left arm weakness.   Patient stated that yesterday her speech got worse and she could not even say post office and today she dropped water from her left hand and pocketbook from her left hand so she came in for further evaluation.  Pt states her speech is a "little slurred and slow" due to strokes in Sept and Oct 2020 baseline; she endorsed she worked on her "vocabulary" then.    Assessment / Plan / Recommendation Clinical Impression  Pt appears to present w/ Moderate Motor Speech deficits c/b Dysarthria impacting intelligibility of speech at the Word-Conversation levels. MRI revealed: "Acute to subacute recurrent right MCA territory infarct confluent in the operculum"; Head CT suggested: "Post ischemic changes within the right cerebral hemisphere appear progressed as compared to prior MRI 04/12/2019, most notably within the cortical/subcortical right frontal parietal lobes and mid to anterior right frontal lobe white matter". OM exam revealed Left lingual/labial weakness, Left lingual deviation, and decreased oral-labial sensation/awareness. Upper denture plate(only) secured w/ Adhesive; missing few lower teeth. Pt is able to utilize strategies of increased volume, slowing rate, and over-articulation to improve articulation and improve intelligibility during speech tasks, conversation. Pt's auditory comprehension and verbal language skills appear at her baseline w/ no new deficits reported during this screening at bedside; NSG assessment. No Overt Cognitive deficits noted in orientation and recall tasks. Pt was able to sustain attention during tasks, follow general instructions and problem solve/sequence upon attempting to place adhesive on Denture plate then place the Denture plate in mouth. Pragmatic behavior was appropriate. Pt engaged and worked appropriately w/ OT during session following.  As per chart notes(Neurology office notes, and pt reports), pt experienced s/s of Dysarthria during previous CVAs in the Fall of 2020.  She endorses s/s this admission are "worse". Pt would benefit from skilled ST services to address noted impairments and strengthen baseline skills in order to maximize verbal communication and independence/safety in ADLs. These needs can be addressed at next venue of care. Pt was instructed/education in general strategies to enhance verbal communication at this time to include: slowing rate, increasing volume, over-articulation. Pt agreed w/ POC. NSG updated.     SLP Assessment  SLP Recommendation/Assessment: All further Speech Lanaguage Pathology  needs can be addressed in the next venue of care SLP Visit Diagnosis: Dysarthria and anarthria (R47.1)    Follow Up Recommendations  Inpatient Rehab    Frequency and Duration (TBD)  (TBD)      SLP Evaluation Cognition  Overall Cognitive Status: Within Functional Limits for tasks assessed(no family present) Arousal/Alertness: Awake/alert Orientation Level: Oriented X4 Attention: Focused;Sustained Focused Attention: Appears intact Sustained Attention: Appears intact Memory: Appears intact(re: NSG in room earlier) Awareness: Appears intact Problem Solving: Appears intact Executive Function: Reasoning;Sequencing Reasoning: Appears intact Sequencing: Appears intact Behaviors: (None) Safety/Judgment: Appears intact Comments: when working w/ OT       Comprehension  Auditory Comprehension Overall Auditory Comprehension: Appears within functional limits for tasks assessed Yes/No Questions: Within Functional Limits Commands: Within Functional Limits Conversation: Complex Interfering Components: (None) Visual Recognition/Discrimination Discrimination: Not tested Reading Comprehension Reading Status: Not tested    Expression Expression Primary Mode of Expression: Verbal Verbal Expression Overall Verbal Expression: Appears within functional limits for tasks assessed Initiation: No impairment Automatic Speech: Name;Social  Response;Counting;Day of week;Month of year Level of Generative/Spontaneous Verbalization: Sentence Repetition: No impairment Naming: No impairment Pragmatics: No impairment Interfering Components: Speech intelligibility(Dysarthria; Motor Planning) Non-Verbal Means of Communication: Not applicable Written Expression Dominant Hand: Right Written Expression: Not tested   Oral / Motor  Oral Motor/Sensory Function Overall Oral Motor/Sensory Function: Moderate impairment Facial ROM: Reduced left;Suspected CN VII (facial) dysfunction Facial Symmetry: Abnormal symmetry left;Suspected CN VII (facial) dysfunction Facial Strength: Reduced left;Suspected CN VII (facial) dysfunction Facial Sensation: Reduced left;Suspected CN V (Trigeminal) dysfunction Lingual ROM: Reduced left;Suspected CN XII (hypoglossal) dysfunction Lingual Symmetry: Abnormal symmetry left;Suspected CN XII (hypoglossal) dysfunction Lingual Strength: Suspected CN XII (hypoglossal) dysfunction;Reduced Lingual Sensation: Suspected CN VII (facial) dysfunction-anterior 2/3 tongue;Reduced Velum: Within Functional Limits Mandible: Within Functional Limits Motor Speech Overall Motor Speech: Impaired at baseline(further impaired per report) Respiration: Within functional limits Phonation: (adequate - min low intermittently) Resonance: Within functional limits(grossly ) Articulation: Impaired Level of Impairment: Word Intelligibility: Intelligibility reduced Word: 25-49% accurate Phrase: 25-49% accurate Sentence: 25-49% accurate Conversation: 25-49% accurate Motor Planning: Impaired Motor Speech Errors: Inconsistent;Aware;Unaware Effective Techniques: Slow rate;Increased vocal intensity;Over-articulate   GO                     Orinda Kenner, [place], CCC-SLP Rjay Revolorio 08/21/2019, 2:05 PM

## 2019-08-21 NOTE — Evaluation (Signed)
Physical Therapy Evaluation Patient Details Name: Brianna Dodson MRN: [id number] DOB: 1934-04-12 Today's Date: 08/21/2019   History of Present Illness  Brianna Dodson is a 86 y.o. female with Past medical history of CVA with residual left arm weakness, HTN, HLD, NIDDM, CKD stage IV, obesity, presented at [facility] ED complaining of left hand and arm weakness.  As per patient she had a stroke last year in September and October, her speech is little bit slurred and left arm weakness.  Patient stated that yesterday (2/16) her speech got worse and she could not even say post office and today she dropped water from her left hand and pocketbook from her left hand so she came in for further evaluation.  Patient denied any chest pain or palpitations, no headache no dizziness, no shortness of breath. Brain MRI 08/20/2019 showed actue to subacute recurrent R MCA territory infarct, underlying chronic ischemic and small vessel disease.    Clinical Impression  Patient alert and oriented with slightly slurred speech. Better at following single commands than multi-step commands. Able to provide detailed history. Patient lives at home with her husband and her grandchildren are available to help with some IADLs. She has 3 steps to enter with B handrails and 13 steps with one handrail to get to the second floor where bed/bath are. She uses a tub/shower combo with no seat. Prior to hospitalization she was I with ambulation household and community distances and was I with ADLs and cooking and cleaning. She does not drive (husband does this). Upon physical therapy evaluation, patient was able to complete all transfers, ambulation, and stairs with and without AD with CGA for safety. She was mildly unsteady without AD in standing, reaching for walls and objects in the room, but did not lose balance when cued not to reach. She demo narrow stance in gait, and drifts a bit each way while ambulating. She needed single step cuing for most  activities. Patient appears to have experienced a decline in functional mobility/independence and would benefit from continued skilled physical therapy to address remaining impairments and functional limitations to work towards stated goals and return to PLOF or maximal functional independence.      Follow Up Recommendations Home health PT;Outpatient PT;Supervision for mobility/OOB(reccomend outpatient PT over HHPT if pt has transportation)    Equipment Recommendations  Rolling walker with 5" wheels    Recommendations for Other Services OT consult     Precautions / Restrictions Precautions Precautions: Fall;Other (comment)([place]) Restrictions Weight Bearing Restrictions: No      Mobility  Bed Mobility               General bed mobility comments: patient already up in chair  Transfers Overall transfer level: Needs assistance Equipment used: Rolling walker (2 wheeled) Transfers: Sit to/from Stand Sit to Stand: Min guard;Supervision         General transfer comment: pateint transfered sit<> stand several times from various surfaces including bed and chair with CGA progressing to supervision. Needed cuing at times for safe positioning.  Ambulation/Gait Ambulation/Gait assistance: Min guard Gait Distance (Feet): 120 Feet(120 c RW, 90+40 c no AD, CGA) Assistive device: Rolling walker (2 wheeled);None Gait Pattern/deviations: Step-through pattern;Narrow base of support Gait velocity: decreased   General Gait Details: Patient ambulated x 120 feet with RW and CGA for safety, tended to drift R slightly and hit nursing station but was able to self-correct once she realized she had hit it. Also ambulated 90 feet with no AD and CGA with occasional  slight drifing to each side and reaching for the walls, did not need physical assist to regain balance. Demo narrow base of support  Stairs Stairs: Yes Stairs assistance: Min guard Stair Management: Two rails Number of Stairs:  14 General stair comments: Patient ascended/descended 14 steps with BUE support on hand rails on the way down with step over step gait pattern; Ascended stairs with step over step and R UE support only. Gained confidence as she went. Missed the door and required cuing to stop and go back to the hall.  Wheelchair Mobility    Modified Rankin (Stroke Patients Only)       Balance Overall balance assessment: Needs assistance Sitting-balance support: Bilateral upper extremity supported Sitting balance-Leahy Scale: Good Sitting balance - Comments: steady sitting at edge of bed   Standing balance support: No upper extremity supported Standing balance-Leahy Scale: Good Standing balance comment: Patient able to ambluate without AD but does tend to drift to each side and reach for walls and objects in the room.     Tandem Stance - Right Leg: (2 seconds) Tandem Stance - Left Leg: (<1 second)       High Level Balance Comments: unable to perform tandem walking             Pertinent Vitals/Pain Pain Assessment: No/denies pain    Home Living Family/patient expects to be discharged to:: Private residence Living Arrangements: Spouse/significant other(Husband) Available Help at Discharge: Family;Available 24 hours/day(grandchildren can hel with cooking and cleaning if needed) Type of Home: House Home Access: Stairs to enter Entrance Stairs-Rails: Can reach both Entrance Stairs-Number of Steps: 4(4 STE in garage c B rails, 3 STE front entrance no rails) Home Layout: Two level Home Equipment: None Additional Comments: Patient tells PT that she wants to be able to get up stairs because that is where the bathroom/bedrooms are    Prior Function Level of Independence: Needs assistance   Gait / Transfers Assistance Needed: Patient reports she is independent with transfers and ambulation in household and community.  ADL's / Homemaking Assistance Needed: Patient reports she is I with ADLs,  cooking, and cleaning, but her husband drives. States her grandchildren can help with cooking and cleaning if needed.  Comments: Per chart review pt has residual speech difficulties 2/2 2 strokes in the last 6 months but was Independent c ADLs     Hand Dominance   Dominant Hand: Right    Extremity/Trunk Assessment   Upper Extremity Assessment Upper Extremity Assessment: Defer to OT evaluation RUE Deficits / Details: AROM WFL, MMT 4+/5 grossly. [facility] WFL LUE Deficits / Details: AROM: shoulder flexion ~120*. MMT: deltoid 3+/5, biceps 4/5. Positive L pronator drift noted. Required increased time and MIN VCs for FNF, RAM, and 5 finger opposition.  LUE Sensation: decreased proprioception(SILT, pt unable to feel when wash cloth droppped from hand) LUE Coordination: decreased fine motor    Lower Extremity Assessment Lower Extremity Assessment: Overall WFL for tasks assessed    Cervical / Trunk Assessment Cervical / Trunk Assessment: Normal  Communication   Communication: Expressive difficulties(slurred speech)  Cognition Arousal/Alertness: Awake/alert Behavior During Therapy: Impulsive(seems slightly impulsive getting up and sitting prior to directions; is eager to participate, may be eagerness vs impusliveness; Also asks repeatedly what she is to do next and defers to PT's directions) Overall Cognitive Status: Difficult to assess  General Comments      Exercises Other Exercises Other Exercises: Patient completed several sit <> stand transfers with education on safe positioning and technique. Other Exercises: Toilet t/f and clothing management, hand washing standing sinkside, face washing, sitting/standing balance/tolerance   Assessment/Plan    PT Assessment Patient needs continued PT services  PT Problem List Decreased safety awareness;Decreased knowledge of precautions;Decreased activity tolerance;Decreased cognition;Decreased  balance;Decreased knowledge of use of DME;Impaired sensation;Decreased strength;Decreased mobility       PT Treatment Interventions DME instruction;Therapeutic activities;Cognitive remediation;Gait training;Therapeutic exercise;Patient/family education;Stair training;Balance training;Functional mobility training;Neuromuscular re-education    PT Goals (Current goals can be found in the Care Plan section)  Acute Rehab PT Goals Patient Stated Goal: get better PT Goal Formulation: With patient Time For Goal Achievement: 09/04/19 Potential to Achieve Goals: Good    Frequency 7X/week   Barriers to discharge Inaccessible home environment many stairs in home    Co-evaluation               AM-PAC PT "6 Clicks" Mobility  Outcome Measure Help needed turning from your back to your side while in a flat bed without using bedrails?: A Little Help needed moving from lying on your back to sitting on the side of a flat bed without using bedrails?: A Little Help needed moving to and from a bed to a chair (including a wheelchair)?: A Little Help needed standing up from a chair using your arms (e.g., wheelchair or bedside chair)?: A Little Help needed to walk in hospital room?: A Little Help needed climbing 3-5 steps with a railing? : A Little 6 Click Score: 18    End of Session Equipment Utilized During Treatment: Gait belt Activity Tolerance: Patient tolerated treatment well Patient left: in chair;with chair alarm set;with call bell/phone within reach Nurse Communication: Mobility status PT Visit Diagnosis: Unsteadiness on feet (R26.81);Hemiplegia and hemiparesis Hemiplegia - Right/Left: Left Hemiplegia - dominant/non-dominant: Non-dominant Hemiplegia - caused by: Cerebral infarction    Time: 1110-1150 PT Time Calculation (min) (ACUTE ONLY): 40 min   Charges:   PT Evaluation $PT Eval Moderate Complexity: 1 Mod PT Treatments $Gait Training: 8-22 mins $Therapeutic Exercise: 8-22  mins        Everlean Alstrom. Graylon Good, PT, DPT 08/21/19, 12:54 PM

## 2019-08-21 NOTE — Progress Notes (Signed)
Pt arrived on the unit from the ED at 2000. Pt was A&Ox4. VSS. Pt oriented to room and bedside equipment. Orders reviewed, acknowledged, and initiated. Care plan and education initiated. Pt instructed not to get OOB without assistance and pt verbalized understanding. Pt had no c/o pain. Bed in lowest position, bed alarm is on, and the call bell is within reach.

## 2019-08-21 NOTE — TOC Progression Note (Signed)
Transition of Care [facility]) - Progression Note    Patient Details  Name: Brianna Dodson MRN: [id number] Date of Birth: 10/10/1933  Transition of Care [facility]) CM/SW [place], RN Phone Number: 08/21/2019, 2:10 PM  Clinical Narrative:     Speech and Occupational Therapy recommended CIR for this patient.  I reached out to the Admissions Coordinator to see if they had this patient on their list of potential admissions. Waiting on response at this time.    Expected Discharge Plan: Home/Self Care Barriers to Discharge: Continued Medical Work up  Expected Discharge Plan and Services Expected Discharge Plan: Home/Self Care                                               Social Determinants of Health (SDOH) Interventions    Readmission Risk Interventions Readmission Risk Prevention Plan 03/16/2019  Transportation Screening Complete  PCP or Specialist Appt within 5-7 Days Complete  Home Care Screening Complete  Medication Review (RN CM) Complete  Some recent data might be hidden

## 2019-08-21 NOTE — Evaluation (Signed)
Clinical/Bedside Swallow Evaluation Patient Details  Name: [facility] MRN: [id number] Date of Birth: [birth year]  Today's Date: 08/21/2019 Time: SLP Start Time (ACUTE ONLY): 0840 SLP Stop Time (ACUTE ONLY): 0930 SLP Time Calculation (min) (ACUTE ONLY): 50 min  Past Medical History:  Past Medical History:  Diagnosis Date  . CKD (chronic kidney disease), stage III   . Diabetes mellitus without complication ([place])   . History of blood transfusion   . Hyperlipidemia   . Hypertension   . Stroke [facility])    Past Surgical History:  Past Surgical History:  Procedure Laterality Date  . ABDOMINAL HYSTERECTOMY  1985  . LOOP RECORDER INSERTION N/A 07/30/2019   Procedure: LOOP RECORDER INSERTION;  Surgeon: Isaias Cowman, MD;  Location: [place] [facility];  Service: Cardiovascular;  Laterality: N/A;  . TEE WITHOUT CARDIOVERSION N/A 04/14/2019   Procedure: TRANSESOPHAGEAL ECHOCARDIOGRAM (TEE);  Surgeon: Teodoro Spray, MD;  Location: [facility] ORS;  Service: Cardiovascular;  Laterality: N/A;   HPI:  Pt is a 86 y.o. female with Past medical history of CVA with residual left arm weakness, HTN, HLD, NIDDM, CKD stage IV, obesity, presented at [facility] complaining of left hand and arm weakness.  As per patient she had a stroke last year in September and October, her speech is little bit slurred and left arm weakness.  Patient stated that yesterday her speech got worse and she could not even say post office and today she dropped water from her left hand and pocketbook from her left hand so she came in for further evaluation.  Pt states her speech is a "little slurred and slow" due to strokes in Sept and Oct 2020 baseline; she endorsed she worked on her "vocabulary" then.    Assessment / Plan / Recommendation Clinical Impression  Pt appears to present w/ Moderate oropharyngeal phase Dysphagia w/ Left lingual/labial weakness prominent. Pt exhibited Overt clinical s/s of Aspiration w/ thin liquids and  Moderate oral phase deficits w/ bolus management, coordination, and oral clearing. Pt is at increased risk for Aspiration w/ oral intake at this time. Using swallowing strategies and aspiration precautions, pt is able to reduce this risk somewhat. She requires Min verbal cues for consistent follow through w/ strategies/precautions. During the oral phase, pt exhibited reduced labial seal, decreased lingual/labial strength and coordination during bolus management. This resulted in prolonged oral phase time for mastication, decreased bolus cohesion w/ left lateral sulci residue d/t decreased awareness/sensation, and need for lingual sweeping/f/u swallow to aid oral clearing. Verbal cues given intermittently for attention to follow through. During the pharyngeal phase, pt exhibited overt coughing(delayed/immediate) w/ trials of thin liquids -- suspect delayed pharyngeal swallow initiation; reduced sensation. W/ trials of Nectar liquids(increased viscosity), No overt s/s of aspiration noted -- timely swallows w/ clear vocal quality b/t trials. No decline in respiraratory effort noted post trials. Similar noted for trials of purees/soft solids. OM exam revealed Left lingual/labial weakness, deviation, and decreased sensation/awareness. Upper denture plate(only) secured w/ Adhesive; missing few lower teeth. Pt fed self w/ support and setup.  Recommend a dysphagia level 2 (MINCED foods moistened) w/ Nectar consistency liquids; aspiration precautions; swallowing strategies to include Left lingual sweeping to clear. Pills in Puree - Crushed as able. Tray setup d/t LUE weakness.  SLP Visit Diagnosis: Dysphagia, oropharyngeal phase (R13.12)    Aspiration Risk  Moderate aspiration risk;Risk for inadequate nutrition/hydration    Diet Recommendation  Dysphagia level 2 (MINCED foods moistened well); Nectar liquids. Aspiration precautions; swallowing strategies including Left lingual  sweeping/clearing w/ bites/sips. Tray setup  at meals d/t LUE weakness.   Medication Administration: Crushed with puree(as able for safer swallow)    Other  Recommendations Recommended Consults: (Dietician f/u) Oral Care Recommendations: Oral care BID;Oral care before and after PO;Staff/trained caregiver to provide oral care Other Recommendations: Order thickener from pharmacy;Prohibited food (jello, ice cream, thin soups);Remove water pitcher;Have oral suction available   Follow up Recommendations Inpatient Rehab      Frequency and Duration min 3x week  2 weeks       Prognosis Prognosis for Safe Diet Advancement: Good Barriers to Reach Goals: (previous CVAs)      Swallow Study   General Date of Onset: 08/20/19 HPI: Pt is a 86 y.o. female with Past medical history of CVA with residual left arm weakness, HTN, HLD, NIDDM, CKD stage IV, obesity, presented at [facility] complaining of left hand and arm weakness.  As per patient she had a stroke last year in September and October, her speech is little bit slurred and left arm weakness.  Patient stated that yesterday her speech got worse and she could not even say post office and today she dropped water from her left hand and pocketbook from her left hand so she came in for further evaluation.  Pt states her speech is a "little slurred and slow" due to strokes in Sept and Oct 2020 baseline; she endorsed she worked on her "vocabulary" then.  Type of Study: Bedside Swallow Evaluation Previous Swallow Assessment: in sept/oct 2020 Diet Prior to this Study: Regular;Thin liquids(per pt) Temperature Spikes Noted: No(wbc 4.1) Respiratory Status: Room air History of Recent Intubation: No Behavior/Cognition: Alert;Cooperative;Pleasant mood Oral Cavity Assessment: Dry Oral Care Completed by SLP: Yes Oral Cavity - Dentition: Missing dentition;Dentures, top((missing lower molars)) Vision: Functional for self-feeding Self-Feeding Abilities: Able to feed self;Needs assist;Needs set up(LUE weakness  and reduced coordination) Patient Positioning: Upright in bed(needed min support for midline position) Baseline Vocal Quality: Normal(dysarthria) Volitional Cough: Strong Volitional Swallow: Able to elicit    Oral/Motor/Sensory Function Overall Oral Motor/Sensory Function: Moderate impairment Facial ROM: Reduced left;Suspected CN VII (facial) dysfunction Facial Symmetry: Abnormal symmetry left;Suspected CN VII (facial) dysfunction Facial Strength: Reduced left;Suspected CN VII (facial) dysfunction Facial Sensation: Reduced left;Suspected CN V (Trigeminal) dysfunction Lingual ROM: Reduced left;Suspected CN XII (hypoglossal) dysfunction Lingual Symmetry: Abnormal symmetry left;Suspected CN XII (hypoglossal) dysfunction Lingual Strength: Suspected CN XII (hypoglossal) dysfunction;Reduced Lingual Sensation: Suspected CN VII (facial) dysfunction-anterior 2/3 tongue;Reduced Velum: Within Functional Limits Mandible: Within Functional Limits   Ice Chips Ice chips: Impaired Presentation: Spoon(fed; single chips - 10) Oral Phase Impairments: Reduced lingual movement/coordination;Poor awareness of bolus Oral Phase Functional Implications: Prolonged oral transit(min) Pharyngeal Phase Impairments: (none)   Thin Liquid Thin Liquid: Impaired Presentation: Cup;Self Fed(4 trials) Oral Phase Impairments: Reduced labial seal;Reduced lingual movement/coordination;Poor awareness of bolus Oral Phase Functional Implications: Left anterior spillage Pharyngeal  Phase Impairments: Cough - Delayed;Cough - Immediate;Suspected delayed Swallow(x3/4 trials)    Nectar Thick Nectar Thick Liquid: Impaired(slight-mild) Presentation: Cup;Self Fed(~6 ozs) Oral Phase Impairments: Reduced labial seal;Reduced lingual movement/coordination(min) Oral phase functional implications: Left anterior spillage;Left lateral sulci pocketing(min) Pharyngeal Phase Impairments: (None)   Honey Thick Honey Thick Liquid: Not tested    Puree Puree: Impaired Presentation: Self Fed;Spoon(assisted; 3 ozs) Oral Phase Impairments: Reduced labial seal;Reduced lingual movement/coordination;Poor awareness of bolus(min) Oral Phase Functional Implications: Left anterior spillage;Left lateral sulci pocketing(min) Pharyngeal Phase Impairments: (None)   Solid     Solid: Impaired Presentation: Self Fed;Spoon(5 trials) Oral Phase Impairments: Reduced labial  seal;Reduced lingual movement/coordination;Impaired mastication(min) Oral Phase Functional Implications: Impaired mastication;Prolonged oral transit;Left lateral sulci pocketing(min) Pharyngeal Phase Impairments: (None)       Orinda Kenner, MS, CCC-SLP [facility] 08/21/2019,12:17 PM

## 2019-08-21 NOTE — Progress Notes (Signed)
Occupational Therapy Evaluation Patient Details Name: [facility] MRN: [id number] DOB: 1933-08-27 Today's Date: 08/21/2019    History of Present Illness [facility] is a 86 y.o. female with Past medical history of CVA with residual left arm weakness, HTN, HLD, NIDDM, CKD stage IV, obesity, presented at [facility] ED complaining of left hand and arm weakness.  As per patient she had a stroke last year in September and October, her speech is little bit slurred and left arm weakness.  Patient stated that yesterday (2/16) her speech got worse and she could not even say post office and today she dropped water from her left hand and pocketbook from her left hand so she came in for further evaluation.  Patient denied any chest pain or palpitations, no headache no dizziness, no shortness of breath. Brain MRI 08/20/2019 showed actue to subacute recurrent R MCA territory infarct, underlying chronic ischemic and small vessel disease.   Clinical Impression   [facility] was seen for OT evaluation this date. Prior to hospital admission, pt was independent in all aspects of ADL. Of note, per chart review pt has PMH of strokes in September and October of 2020 resulting in increased expressive speech difficulty and LUE weakness. Pt lives with her husband in a two-level home with bedroom/bathroom on the second level. Currently pt demonstrates significant impairments in LUE coordination, strength, and sensation. Pt's fine motor coordination is significantly impaired as noted by pt repeatedly dropping items held in L hand t/o functional ADL tasks. Pt is R hand dominant and requires MIN assist + increased time for standing grooming bimanual ADLs c VCs to maintain attention to LUE. Pt is eager to return to her PLOF with increased independence and functional use of her left arm. Pt educated on safe positioning of LUE in order to promote functional return, improve safety, prevent edema, and promote skin integrity on this date. Pt was  motivated t/o OT session and return demonstrated PROM exercises with VCs for technique. No cognitive or visual deficits noted with assessment on this date. Pt would benefit from skilled OT to address noted impairments and functional limitations (see below for any additional details) in order to maximize safety and independence while minimizing falls risk and caregiver burden.  Upon hospital discharge, recommend pt discharge to CIR to maximize safety and return to PLOF.    Follow Up Recommendations  CIR    Equipment Recommendations  3 in 1 bedside commode;Tub/shower seat    Recommendations for Other Services Rehab consult     Precautions / Restrictions Precautions Precautions: Fall;Other (comment)(Aspiration) Restrictions Weight Bearing Restrictions: No      Mobility Bed Mobility Overal bed mobility: Needs Assistance             General bed mobility comments: patient already up in chair  Transfers Overall transfer level: Needs assistance Equipment used: Rolling walker (2 wheeled) Transfers: Sit to/from Stand Sit to Stand: Min guard;Supervision         General transfer comment: pateint transfered sit<> stand several times from various surfaces including bed and chair with CGA progressing to supervision. Needed cuing at times for safe positioning.    Balance Overall balance assessment: Needs assistance Sitting-balance support: Bilateral upper extremity supported Sitting balance-Leahy Scale: Good Sitting balance - Comments: steady sitting at edge of bed   Standing balance support: No upper extremity supported Standing balance-Leahy Scale: Good Standing balance comment: Patient able to ambluate without AD but does tend to drift to each side and reach for walls and  objects in the room.     Tandem Stance - Right Leg: (2 seconds) Tandem Stance - Left Leg: (<1 second)       High Level Balance Comments: unable to perform tandem walking           ADL either  performed or assessed with clinical judgement   ADL Overall ADL's : Needs assistance/impaired                                       General ADL Comments: MIN A + MOD VCs to wash face standing sinkside - VCs for bimanual integration. Pt repeatedly dropped wash cloth t/o grooming task. Supervision + RW toileting at commode and handwashing standing sinkside. MIN A wipe lips c LUE seated in bed.     Vision         Perception     Praxis      Pertinent Vitals/Pain Pain Assessment: No/denies pain     Hand Dominance Right   Extremity/Trunk Assessment Upper Extremity Assessment Upper Extremity Assessment: Defer to OT evaluation RUE Deficits / Details: AROM WFL, MMT 4+/5 grossly. [facility] WFL LUE Deficits / Details: AROM: shoulder flexion ~120*. MMT: deltoid 3+/5, biceps 4/5. Positive L pronator drift noted. Required increased time and MIN VCs for FNF, RAM, and 5 finger opposition.  LUE Sensation: decreased proprioception(SILT, pt unable to feel when wash cloth droppped from hand) LUE Coordination: decreased fine motor   Lower Extremity Assessment Lower Extremity Assessment: Overall WFL for tasks assessed   Cervical / Trunk Assessment Cervical / Trunk Assessment: Normal   Communication Communication Communication: Expressive difficulties(slurred speech)   Cognition Arousal/Alertness: Awake/alert Behavior During Therapy: Impulsive(seems slightly impulsive getting up and sitting prior to directions; is eager to participate, may be eagerness vs impusliveness; Also asks repeatedly what she is to do next and defers to PT's directions) Overall Cognitive Status: Difficult to assess                                 General Comments: Pt required increased time to follow multistep commands   General Comments       Exercises Exercises: Other exercises Other Exercises Other Exercises: Patient completed several sit <> stand transfers with education on safe  positioning and technique. Other Exercises: Toilet t/f and clothing management, hand washing standing sinkside, face washing, sitting/standing balance/tolerance   Shoulder Instructions      Home Living Family/patient expects to be discharged to:: Private residence Living Arrangements: Spouse/significant other(Husband) Available Help at Discharge: Family;Available 24 hours/day(grandchildren can hel with cooking and cleaning if needed) Type of Home: House Home Access: Stairs to enter [employer] of Steps: 4(4 STE in garage c B rails, 3 STE front entrance no rails) Entrance Stairs-Rails: Can reach both Home Layout: Two level Alternate Level Stairs-Number of Steps: 13 Alternate Level Stairs-Rails: Right Bathroom Shower/Tub: Teacher, early years/pre: Standard Bathroom Accessibility: Yes How Accessible: Accessible via walker Home Equipment: None   Additional Comments: Patient tells PT that she wants to be able to get up stairs because that is where the bathroom/bedrooms are      Prior Functioning/Environment Level of Independence: Needs assistance  Gait / Transfers Assistance Needed: Patient reports she is independent with transfers and ambulation in household and community. ADL's / Homemaking Assistance Needed: Patient reports she is I with ADLs, cooking,  and cleaning, but her husband drives. States her grandchildren can help with cooking and cleaning if needed. Communication / Swallowing Assistance Needed: speech slightly slurred Comments: Per chart review pt has residual speech difficulties 2/2 2 strokes in the last 6 months but was Independent c ADLs and iADLs.        OT Problem List: Decreased strength;Decreased range of motion;Decreased coordination;Decreased safety awareness;Decreased knowledge of use of DME or AE;Impaired sensation;Impaired UE functional use      OT Treatment/Interventions: Self-care/ADL training;Therapeutic exercise;Neuromuscular  education;Energy conservation;DME and/or AE instruction;Therapeutic activities;Cognitive remediation/compensation;Visual/perceptual remediation/compensation;Patient/family education    OT Goals(Current goals can be found in the care plan section) Acute Rehab OT Goals Patient Stated Goal: get better OT Goal Formulation: With patient Time For Goal Achievement: 09/04/19 Potential to Achieve Goals: Good ADL Goals Pt Will Perform Grooming: Independently;standing(for improved functional use of BUE during bimanual grooming tasks) Pt Will Perform Toileting - Clothing Manipulation and hygiene: Independently;sit to/from stand(c AE PRN for improved safety) Pt/caregiver will Perform Home Exercise Program: Increased strength;Left upper extremity;With theraputty;Independently;With written HEP provided  OT Frequency: Min 2X/week   Barriers to D/C: Inaccessible home environment          Co-evaluation              AM-PAC OT "6 Clicks" Daily Activity     Outcome Measure Help from another person eating meals?: A Little Help from another person taking care of personal grooming?: A Little Help from another person toileting, which includes using toliet, bedpan, or urinal?: A Little Help from another person bathing (including washing, rinsing, drying)?: A Little Help from another person to put on and taking off regular upper body clothing?: A Little Help from another person to put on and taking off regular lower body clothing?: A Little 6 Click Score: 18   End of Session Equipment Utilized During Treatment: Gait belt;Rolling walker  Activity Tolerance: Patient tolerated treatment well Patient left: in chair;with call bell/phone within reach;with chair alarm set;with nursing/sitter in room  OT Visit Diagnosis: Unsteadiness on feet (R26.81);Muscle weakness (generalized) (M62.81);Hemiplegia and hemiparesis Hemiplegia - Right/Left: Left Hemiplegia - dominant/non-dominant: Non-Dominant Hemiplegia -  caused by: Cerebral infarction                Time: 3428-7681 OT Time Calculation (min): 32 min Charges:  OT General Charges $OT Visit: 1 Visit OT Evaluation $OT Eval Moderate Complexity: 1 Mod OT Treatments $Self Care/Home Management : 23-37 mins  Dessie Coma, M.S. OTR/L  08/21/19, 1:36 PM

## 2019-08-21 NOTE — Progress Notes (Signed)
Occupational Therapy Treatment Patient Details Name: Brianna Dodson MRN: [id number] DOB: 1934-02-28 Today's Date: 08/21/2019    History of present illness Brianna Dodson is a 86 y.o. female with Past medical history of CVA with residual left arm weakness, HTN, HLD, NIDDM, CKD stage IV, obesity, presented at [facility] ED complaining of left hand and arm weakness.  As per patient she had a stroke last year in September and October, her speech is little bit slurred and left arm weakness.  Patient stated that yesterday (2/16) her speech got worse and she could not even say post office and today she dropped water from her left hand and pocketbook from her left hand so she came in for further evaluation.  Patient denied any chest pain or palpitations, no headache no dizziness, no shortness of breath. Brain MRI 08/20/2019 showed actue to subacute recurrent R MCA territory infarct, underlying chronic ischemic and small vessel disease.   OT comments  [name] was seen for OT treatment on this date. Upon arrival to room pt was awake and reclined in bed, leaning to railing on R side c LLE near L EOB. Pt A&O x 4 reporting no pain and agreeable to tx. OT provided red Theraputty and written fine motor and theraputty HEP. Pt instructed in LUE HEP (pinch, bimanual roll, spreading/closing digits on table, single digit isolated flexion/extension). Requiring CGA assist + MOD VCs for toileting ADL tasks. Pt required multimodal cueing for sequencing hand washing and attention to LUE during toileting. Pt verbalized understanding /return demonstrated of instruction provided. Pt making good progress toward goals. Pt continues to benefit from skilled OT services to maximize return to PLOF and minimize risk of future falls, injury, caregiver burden, and readmission. Frequency updated to 3x/week to maximize return to PLOF. Will continue to follow POC. Discharge recommendation remains appropriate.    Follow Up Recommendations  CIR     Equipment Recommendations  3 in 1 bedside commode;Tub/shower seat    Recommendations for Other Services      Precautions / Restrictions Precautions Precautions: Fall;Other (comment)(Aspiration) Restrictions Weight Bearing Restrictions: No       Mobility Bed Mobility Overal bed mobility: Needs Assistance Bed Mobility: Sit to Supine;Supine to Sit;Rolling Rolling: Min guard         General bed mobility comments: CGA + MIN VCs to sequence rolling and to attend to left edge of bed  Transfers Overall transfer level: Needs assistance   Transfers: Sit to/from Stand Sit to Stand: Min guard         General transfer comment: CGA + R bed rail sit<>stand at EOB. MIN A sit<>stand at toilet    Balance Overall balance assessment: Needs assistance Sitting-balance support: Feet supported;Single extremity supported Sitting balance-Leahy Scale: Good Sitting balance - Comments: steady sitting at edge of bed   Standing balance support: Bilateral upper extremity supported;During functional activity Standing balance-Leahy Scale: Fair Standing balance comment: Pt ambulated in bathroom without AD but attempted to furniture walks on walls and sink.                            ADL either performed or assessed with clinical judgement   ADL Overall ADL's : Needs assistance/impaired                                       General ADL Comments: CGA + MOD VCs  to toilet at commode - pt repeatedly dropped toilet paper in L hand and required cueing to dispose of paper. CGA + MIN VCs for sequencing to wash hands standing sinkside.      Vision       Perception     Praxis      Cognition Arousal/Alertness: Awake/alert Behavior During Therapy: WFL for tasks assessed/performed Overall Cognitive Status: Within Functional Limits for tasks assessed                                 General Comments: Pt required repeated, multimodal cues to follow one step  commands.         Exercises Exercises: Other exercises Other Exercises Other Exercises: Pt educated re: falls prevention, transfer technique, and importance of LUE use for improved functional bimanual integration Other Exercises: Toilet t/f and clothing management, hand washing standing sinkside, sitting/standing balance/tolerance, bed mobility Other Exercises: THER EX: theraputty and LUE fine motor HEP provided - roll putty into ball/snake, pinch pieces of putty,    Shoulder Instructions       General Comments      Pertinent Vitals/ Pain       Pain Assessment: No/denies pain  Home Living     Available Help at Discharge: Family;Available 24 hours/day(Sister, grandchildren can assist) Type of Home: House                              Lives With: Spouse    Prior Functioning/Environment              Frequency  Min 3X/week        Progress Toward Goals  OT Goals(current goals can now be found in the care plan section)  Progress towards OT goals: Progressing toward goals  Acute Rehab OT Goals Patient Stated Goal: get better OT Goal Formulation: With patient Time For Goal Achievement: 09/04/19 Potential to Achieve Goals: Good ADL Goals Pt Will Perform Grooming: Independently;standing(for improved functional use of BUE during bimanual grooming ) Pt Will Perform Toileting - Clothing Manipulation and hygiene: Independently;sit to/from stand(c AE PRN for improved safety) Pt/caregiver will Perform Home Exercise Program: Increased strength;Left upper extremity;With theraputty;Independently;With written HEP provided(for improved functional and bimanual integration of LUE)  Plan Discharge plan remains appropriate;Frequency needs to be updated    Co-evaluation                 AM-PAC OT "6 Clicks" Daily Activity     Outcome Measure   Help from another person eating meals?: A Little Help from another person taking care of personal grooming?: A  Little Help from another person toileting, which includes using toliet, bedpan, or urinal?: A Little Help from another person bathing (including washing, rinsing, drying)?: A Little Help from another person to put on and taking off regular upper body clothing?: A Little Help from another person to put on and taking off regular lower body clothing?: A Little 6 Click Score: 18    End of Session Equipment Utilized During Treatment: Gait belt  OT Visit Diagnosis: Unsteadiness on feet (R26.81);Muscle weakness (generalized) (M62.81);Hemiplegia and hemiparesis Hemiplegia - Right/Left: Left Hemiplegia - dominant/non-dominant: Non-Dominant Hemiplegia - caused by: Cerebral infarction   Activity Tolerance Patient tolerated treatment well   Patient Left in bed;with call bell/phone within reach;with bed alarm set   Nurse Communication Other (comment)(Pt request clean mask and cream for buttocks)  Time: 0071-2197 OT Time Calculation (min): 19 min  Charges: OT General Charges $OT Visit: 1 Visit OT Treatments $Self Care/Home Management : 8-22 mins  Dessie Coma, M.S. OTR/L  08/21/19, 5:05 PM

## 2019-08-21 NOTE — Consult Note (Signed)
Requesting Physician: Dwyane Dee    Chief Complaint: Left sided weakness  I have been asked by Dr. Dwyane Dee to see this patient in consultation for acute stroke.  HPI: Brianna Dodson is an 86 y.o. female with a history of DM, HTN, HLD CKD and stroke in September of 2020 with resultant deficits of slurred speech and left sided weakness who reports worsening of her left sided symptoms.  Patient reports that on 12/16 she noted that her speech was worse.  She had difficulty pronouncing words and getting her words out.  On yesterday also noted that she was dropping things from her left hand.  Presented for evaluation at that time.  Initial NIHSS of 2.     Date last known well: 08/19/2019 Time last known well: Time: 11:30 tPA Given: No: Outside time window  Past Medical History:  Diagnosis Date  . CKD (chronic kidney disease), stage III   . Diabetes mellitus without complication ([place])   . History of blood transfusion   . Hyperlipidemia   . Hypertension   . Stroke [facility])     Past Surgical History:  Procedure Laterality Date  . ABDOMINAL HYSTERECTOMY  1985  . LOOP RECORDER INSERTION N/A 07/30/2019   Procedure: LOOP RECORDER INSERTION;  Surgeon: Isaias Cowman, MD;  Location: [place] [facility];  Service: Cardiovascular;  Laterality: N/A;  . TEE WITHOUT CARDIOVERSION N/A 04/14/2019   Procedure: TRANSESOPHAGEAL ECHOCARDIOGRAM (TEE);  Surgeon: Teodoro Spray, MD;  Location: [facility] ORS;  Service: Cardiovascular;  Laterality: N/A;    Family History  Problem Relation Age of Onset  . Stroke Mother   . Diabetes Mother   . Heart disease Father   . Kidney disease Sister   . Diabetes Sister   . Kidney disease Brother   . Heart disease Sister   . Diabetes Sister   . Diabetes Sister   . Diabetes Sister   . Kidney disease Sister   . Heart disease Sister   . Kidney disease Brother        kidney transplant  . Early death Brother 78       Truck Accident - died  . Heart disease Brother    Social  History:  reports that she has never smoked. She has never used smokeless tobacco. She reports that she does not drink alcohol or use drugs.  Allergies:  Allergies  Allergen Reactions  . Nsaids     CKD stage III - Avoid all nephrotoxic drugs    Medications:  I have reviewed the patient's current medications. Prior to Admission:  Medications Prior to Admission  Medication Sig Dispense Refill Last Dose  . amLODipine (NORVASC) 10 MG tablet TAKE 1 TABLET BY MOUTH EVERY DAY (Patient taking differently: Take 10 mg by mouth daily. ) 90 tablet 1 08/19/2019 at Unknown time  . aspirin 81 MG tablet Take 81 mg by mouth daily at 12 noon.    08/19/2019 at Unknown time  . clopidogrel (PLAVIX) 75 MG tablet Take 1 tablet (75 mg total) by mouth daily. 30 tablet 3 08/19/2019 at Unknown time  . Ferrous Sulfate (IRON) 28 MG TABS Take 28 mg by mouth daily at 12 noon.   08/19/2019 at Unknown time  . glimepiride (AMARYL) 1 MG tablet TAKE 3 TABLETS (3 MG TOTAL) BY MOUTH DAILY WITH BREAKFAST. 270 tablet 1 08/19/2019 at Unknown time  . lisinopril (ZESTRIL) 10 MG tablet Take 1 tablet (10 mg total) by mouth daily. (Patient taking differently: Take 10 mg by mouth daily at  12 noon. ) 90 tablet 1 08/19/2019 at Unknown time  . rosuvastatin (CRESTOR) 40 MG tablet TAKE 1 TABLET BY MOUTH EVERY DAY (Patient taking differently: Take 40 mg by mouth daily. ) 90 tablet 3 08/19/2019 at Unknown time  . Vitamin D, Ergocalciferol, (DRISDOL) 50000 units CAPS capsule Take 50,000 Units by mouth every 30 (thirty) days.  1 Past Month at Unknown time  . ACCU-CHEK GUIDE test strip USE TO CHECK BLOOD SUGARS TWICE DAILY. E11.9 100 each 0   . blood glucose meter kit and supplies KIT Dispense based on patient and insurance preference. Check CBGs two times daily. E11.9. 1 each 0   . blood glucose meter kit and supplies Dispense based on patient and insurance preference. Use up to four times daily as directed. E11.9 1 each 0   . clopidogrel (PLAVIX) 75 MG  tablet TAKE ONE TABLET BY MOUTH DAILY (Patient taking differently: Take 75 mg by mouth daily. ) 30 tablet 3   . glimepiride (AMARYL) 2 MG tablet TAKE 1 TABLET BY MOUTH DAILY WITH BREAKFAST 90 tablet 3   . Lancets (ONETOUCH ULTRASOFT) lancets Use as instructed 100 each 6    Scheduled: .  stroke: mapping our early stages of recovery book   Does not apply Once  . aspirin EC  81 mg Oral Q1200  . clopidogrel  75 mg Oral Daily  . enoxaparin (LOVENOX) injection  30 mg Subcutaneous Q24H  . insulin aspart  0-15 Units Subcutaneous Q4H  . rosuvastatin  40 mg Oral Daily  . sodium chloride flush  3 mL Intravenous Once    ROS: History obtained from the patient  General ROS: negative for - chills, fatigue, fever, night sweats, weight gain or weight loss Psychological ROS: negative for - behavioral disorder, hallucinations, memory difficulties, mood swings or suicidal ideation Ophthalmic ROS: negative for - blurry vision, double vision, eye pain or loss of vision ENT ROS: negative for - epistaxis, nasal discharge, oral lesions, sore throat, tinnitus or vertigo Allergy and Immunology ROS: negative for - hives or itchy/watery eyes Hematological and Lymphatic ROS: negative for - bleeding problems, bruising or swollen lymph nodes Endocrine ROS: negative for - galactorrhea, hair pattern changes, polydipsia/polyuria or temperature intolerance Respiratory ROS: negative for - cough, hemoptysis, shortness of breath or wheezing Cardiovascular ROS: negative for - chest pain, dyspnea on exertion, edema or irregular heartbeat Gastrointestinal ROS: negative for - abdominal pain, diarrhea, hematemesis, nausea/vomiting or stool incontinence Genito-Urinary ROS: negative for - dysuria, hematuria, incontinence or urinary frequency/urgency Musculoskeletal ROS: negative for - joint swelling or muscular weakness Neurological ROS: as noted in HPI Dermatological ROS: negative for rash and skin lesion changes  Physical  Examination: Blood pressure (!) 167/61, pulse 64, temperature 97.8 F (36.6 C), temperature source Oral, resp. rate 17, height 5' 5"  (1.651 m), weight 81.5 kg, SpO2 97 %.  HEENT-  Normocephalic, no lesions, without obvious abnormality.  Normal external eye and conjunctiva.  Normal TM's bilaterally.  Normal auditory canals and external ears. Normal external nose, mucus membranes and septum.  Normal pharynx. Cardiovascular- S1, S2 normal, pulses palpable throughout   Lungs- chest clear, no wheezing, rales, normal symmetric air entry Abdomen- soft, non-tender; bowel sounds normal; no masses,  no organomegaly Extremities- no edema Lymph-no adenopathy palpable Musculoskeletal-no joint tenderness, deformity or swelling Skin-warm and dry, no hyperpigmentation, vitiligo, or suspicious lesions  Neurological Examination   Mental Status: Alert, oriented, thought content appropriate.  Speech non- fluent.  Able to follow 3 step commands without difficulty. Cranial  Nerves: II: Discs flat bilaterally; Visual fields grossly normal, pupils equal, round, reactive to light and accommodation III,IV, VI: ptosis not present, extra-ocular motions intact bilaterally V,VII: left facial droop, facial light touch sensation decreased on the left VIII: hearing normal bilaterally IX,X: gag reflex present XI: bilateral shoulder shrug XII: midline tongue extension Motor: Right : Upper extremity   5/5    Left:     Upper extremity   4/5  Lower extremity   5/5     Lower extremity   4-/5 Tone and bulk:normal tone throughout; no atrophy noted Sensory: Pinprick and light touch intact throughout, bilaterally Deep Tendon Reflexes: Symmetric throughout Plantars: Right: mute   Left: mute Cerebellar: Normal finger-to-nose and normal heel-to-shin testing bilaterally Gait: not tested due to safety concerns    Laboratory Studies:  Basic Metabolic Panel: Recent Labs  Lab 08/20/19 1345 08/21/19 0534  NA 139 141  K 4.7  4.6  CL 105 110  CO2 24 25  GLUCOSE 223* 117*  BUN 23 24*  CREATININE 2.26* 2.01*  CALCIUM 8.9 8.3*  MG  --  2.1  PHOS  --  3.7    Liver Function Tests: Recent Labs  Lab 08/20/19 1345  AST 25  ALT 15  ALKPHOS 46  BILITOT 0.9  PROT 7.1  ALBUMIN 3.5   No results for input(s): LIPASE, AMYLASE in the last 168 hours. No results for input(s): AMMONIA in the last 168 hours.  CBC: Recent Labs  Lab 08/20/19 1345 08/21/19 0534  WBC 4.1 4.1  NEUTROABS 2.8  --   HGB 9.7* 8.4*  HCT 29.0* 24.9*  MCV 85.0 83.8  PLT 190 156    Cardiac Enzymes: No results for input(s): CKTOTAL, CKMB, CKMBINDEX, TROPONINI in the last 168 hours.  BNP: Invalid input(s): POCBNP  CBG: Recent Labs  Lab 08/20/19 2035 08/21/19 0038 08/21/19 0432 08/21/19 0734 08/21/19 1155  GLUCAP 93 94 115* 117* 201*    Microbiology: Results for orders placed or performed during the hospital encounter of 08/20/19  Respiratory Panel by RT PCR (Flu A&B, Covid) - Nasopharyngeal Swab     Status: None   Collection Time: 08/20/19  4:21 PM   Specimen: Nasopharyngeal Swab  Result Value Ref Range Status   SARS Coronavirus 2 by RT PCR NEGATIVE NEGATIVE Final    Comment: (NOTE) SARS-CoV-2 target nucleic acids are NOT DETECTED. The SARS-CoV-2 RNA is generally detectable in upper respiratoy specimens during the acute phase of infection. The lowest concentration of SARS-CoV-2 viral copies this assay can detect is 131 copies/mL. A negative result does not preclude SARS-Cov-2 infection and should not be used as the sole basis for treatment or other patient management decisions. A negative result may occur with  improper specimen collection/handling, submission of specimen other than nasopharyngeal swab, presence of viral mutation(s) within the areas targeted by this assay, and inadequate number of viral copies (<131 copies/mL). A negative result must be combined with clinical observations, patient history, and  epidemiological information. The expected result is Negative. Fact Sheet for Patients:  PinkCheek.be Fact Sheet for Healthcare Providers:  GravelBags.it This test is not yet ap proved or cleared by the [place] FDA and  has been authorized for detection and/or diagnosis of SARS-CoV-2 by FDA under an Emergency Use Authorization (EUA). This EUA will remain  in effect (meaning this test can be used) for the duration of the COVID-19 declaration under Section 564(b)(1) of the Act, 21 U.S.C. section 360bbb-3(b)(1), unless the authorization is terminated or revoked sooner.  Influenza A by PCR NEGATIVE NEGATIVE Final   Influenza B by PCR NEGATIVE NEGATIVE Final    Comment: (NOTE) The Xpert Xpress SARS-CoV-2/FLU/RSV assay is intended as an aid in  the diagnosis of influenza from Nasopharyngeal swab specimens and  should not be used as a sole basis for treatment. Nasal washings and  aspirates are unacceptable for Xpert Xpress SARS-CoV-2/FLU/RSV  testing. Fact Sheet for Patients: PinkCheek.be Fact Sheet for Healthcare Providers: GravelBags.it This test is not yet approved or cleared by the [place] FDA and  has been authorized for detection and/or diagnosis of SARS-CoV-2 by  FDA under an Emergency Use Authorization (EUA). This EUA will remain  in effect (meaning this test can be used) for the duration of the  Covid-19 declaration under Section 564(b)(1) of the Act, 21  U.S.C. section 360bbb-3(b)(1), unless the authorization is  terminated or revoked. Performed at [facility], [address] [place], [place] [address]     Coagulation Studies: Recent Labs    08/20/19 1345  LABPROT 14.0  INR 1.1    Urinalysis: No results for input(s): COLORURINE, LABSPEC, PHURINE, GLUCOSEU, HGBUR, BILIRUBINUR, KETONESUR, PROTEINUR, UROBILINOGEN, NITRITE,  LEUKOCYTESUR in the last 168 hours.  Invalid input(s): APPERANCEUR  Lipid Panel:    Component Value Date/Time   CHOL 172 08/21/2019 0534   TRIG 154 (H) 08/21/2019 0534   HDL 29 (L) 08/21/2019 0534   CHOLHDL 5.9 08/21/2019 0534   VLDL 31 08/21/2019 0534   LDLCALC 112 (H) 08/21/2019 0534    HgbA1C:  Lab Results  Component Value Date   HGBA1C 7.5 (H) 08/21/2019    Urine Drug Screen:  No results found for: LABOPIA, COCAINSCRNUR, LABBENZ, AMPHETMU, THCU, LABBARB  Alcohol Level: No results for input(s): ETH in the last 168 hours.  Other results: EKG: sinus rhythm at 62 bpm.  Imaging: CT HEAD WO CONTRAST  Result Date: 08/20/2019 CLINICAL DATA:  Possible stroke, focal neuro deficit, greater than 6 hours, stroke suspected. Additional history provided: Left arm weakness, previous stroke in October and September 2020, residual slurred speech EXAM: CT HEAD WITHOUT CONTRAST TECHNIQUE: Contiguous axial images were obtained from the base of the skull through the vertex without intravenous contrast. COMPARISON:  Brain MRI 04/12/2019 FINDINGS: Brain: There is no evidence of acute intracranial hemorrhage. Again demonstrated is patchy cortical and subcortical hypodensity within the right frontoparietal lobes and portions of the posterior right temporal lobe. Some of this abnormal hypodensity corresponds with the infarcts which were acute at time of prior MRI 04/12/2019. However, some of these ischemic changes appear progressed as compared to this prior exam. For instance, within the cortical and subcortical right frontoparietal lobe (series 2, images 17-21) and within the mid to anterior right frontal lobe white matter. Redemonstrated small chronic cortical infarct within the inferior right parietal lobe (series 2, image 19). Background advanced chronic small vessel ischemic disease. Moderate generalized parenchymal atrophy. There is no evidence of intracranial mass. No midline shift or extra-axial fluid  collection Vascular: No hyperdense vessel.  Atherosclerotic calcifications. Skull: No acute bony abnormality. Sinuses/Orbits: Visualized orbits demonstrate no acute abnormality. Frothy secretions within the right sphenoid sinus and posterior right ethmoid air cells. No significant mastoid effusion at the imaged levels. IMPRESSION: 1. Post ischemic changes within the right cerebral hemisphere appear progressed as compared to prior MRI 04/12/2019, most notably within the cortical/subcortical right frontal parietal lobes and mid to anterior right frontal lobe white matter. Some of these changes may be acute and MRI is recommended for further  evaluation. 2. Redemonstrated background advanced chronic small vessel ischemic disease with moderate generalized parenchymal atrophy. 3. Frothy secretions within the right sphenoid sinus and posterior right ethmoid air cells. Correlate for acute sinusitis. Electronically Signed   By: Kellie Simmering DO   On: 08/20/2019 14:29   MR Brain Wo Contrast (neuro protocol)  Result Date: 08/20/2019 CLINICAL DATA:  86 year old female with stroke-like symptoms. Ischemic changes progressed in the right hemisphere by CT since October. EXAM: MRI HEAD WITHOUT CONTRAST TECHNIQUE: Multiplanar, multiecho pulse sequences of the brain and surrounding structures were obtained without intravenous contrast. COMPARISON:  Head CT earlier today. Brain MRI 04/12/2019. FINDINGS: Brain: Confluent restricted diffusion in the operculum and right lateral perirolandic cortex. Scattered involvement of the right insula. See series 5, image 28 and series 8, image 15. Associated T2 and FLAIR hyperintensity. Minimal T1 hypointensity. No evidence of associated hemorrhage. No mass effect. No left hemisphere or posterior fossa restricted diffusion. Small chronic infarct in the right cerebellum. Patchy and confluent bilateral cerebral white matter T2 and FLAIR hyperintensity. A small area of right lateral occiput cortical  encephalomalacia is new since October on series 7, image 32. There are chronic microhemorrhages in the bilateral thalamus. No midline shift, mass effect, evidence of mass lesion, ventriculomegaly, extra-axial collection or acute intracranial hemorrhage. Cervicomedullary junction and pituitary are within normal limits. Vascular: Major intracranial vascular flow voids are stable. Skull and upper cervical spine: Negative visible cervical spine. Normal bone marrow signal. Sinuses/Orbits: Stable and negative orbits. Mild bubbly opacity in the right posterior ethmoid and sphenoid air cells are stable. Other: Mastoids remain clear. Visible internal auditory structures appear normal. Scalp and face soft tissues appear negative. IMPRESSION: 1. Acute to subacute recurrent right MCA territory infarct, confluent in the operculum. No associated hemorrhage or mass effect. 2. Elsewhere stable underlying chronic ischemic and small vessel disease since October. Electronically Signed   By: Genevie Ann M.D.   On: 08/20/2019 17:54   US Carotid Bilateral (at [facility] and [facility] only)  Result Date: 08/21/2019 CLINICAL DATA:  CVA. Visual disturbance. History of hypertension, hyperlipidemia and diabetes. EXAM: BILATERAL CAROTID DUPLEX ULTRASOUND TECHNIQUE: Pearline Cables scale imaging, color Doppler and duplex ultrasound were performed of bilateral carotid and vertebral arteries in the neck. COMPARISON:  03/16/2019 FINDINGS: Criteria: Quantification of carotid stenosis is based on velocity parameters that correlate the residual internal carotid diameter with NASCET-based stenosis levels, using the diameter of the distal internal carotid lumen as the denominator for stenosis measurement. The following velocity measurements were obtained: RIGHT ICA: 86/20 cm/sec CCA: 32/9 cm/sec SYSTOLIC ICA/CCA RATIO:  1.3 ECA: 75 cm/sec LEFT ICA: 128/33 cm/sec CCA: 51/8 cm/sec SYSTOLIC ICA/CCA RATIO:  1.8 ECA: 139 cm/sec RIGHT CAROTID ARTERY: There is a moderate amount of  eccentric echogenic plaque within the right carotid bulb (images 14 and 16), extending to involve the origin and proximal aspects of the right internal carotid artery (image 24), morphologically similar to the 03/2019 examination and again not resulting in elevated peak systolic velocities within the interrogated course of the right internal carotid artery to suggest a hemodynamically significant stenosis. RIGHT VERTEBRAL ARTERY:  Antegrade flow LEFT CAROTID ARTERY: There is a moderate to large amount of eccentric echogenic partially shadowing plaque within the left carotid bulb (image 49), extending to involve the origin and proximal aspects of the left internal carotid artery (image 57), morphologically similar to the 03/2019 examination though now results in borderline elevated peak systolic velocities within mid aspect the left internal carotid artery. Greatest acquired peak  systolic velocity of the mid left ICA measures 128 centimeters/second (image 62), previously, 94 centimeters/second. LEFT VERTEBRAL ARTERY:  Antegrade flow Note is made of an approximately 1.4 x 1.3 x 1.3 cm hypoechoic nodule within the left lobe of the thyroid which appears to contain several internal macrocalcifications. IMPRESSION: 1. Moderate to large amount of left-sided atherosclerotic plaque, morphologically similar to the 03/2019 examination, though now results in elevated peak systolic velocities within the left internal carotid artery compatible with the 50-69% luminal narrowing range. Further evaluation with CTA could performed as clinically indicated. 2. Moderate amount of right-sided atherosclerotic plaque, similar to the 03/2019 examination, and again not resulting in a hemodynamically significant stenosis. 3. Incidental note made of an approximately 1.4 cm hypoechoic nodule which is noted to contain several internal macrocalcifications - further evaluation with dedicated thyroid ultrasound could be performed as clinically  indicated. Electronically Signed   By: Sandi Mariscal M.D.   On: 08/21/2019 12:21   ECHOCARDIOGRAM COMPLETE  Result Date: 08/21/2019    ECHOCARDIOGRAM REPORT   Patient Name:   AURIELLE SLINGERLAND Date of Exam: 08/21/2019 Medical Rec #:  [id number]     Height:       65.0 in Accession #:    [id number]    Weight:       179.6 lb Date of Birth:  [birth year]      BSA:          1.89 m Patient Age:    61 years      BP:           167/61 mmHg Patient Gender: F             HR:           64 bpm. Exam Location:  [facility] Procedure: 2D Echo, Cardiac Doppler and Color Doppler Indications:     Stroke 434.91  History:         Patient has prior history of Echocardiogram examinations, most                  recent 04/14/2019.  Sonographer:     Sherrie Sport RDCS (AE) Referring Phys:  [id number] Val Riles Diagnosing Phys: Ida Rogue MD IMPRESSIONS  1. Left ventricular ejection fraction, by estimation, is 60 to 65%. The left ventricle has normal function. The left ventricle has no regional wall motion abnormalities. Left ventricular diastolic parameters are consistent with Grade I diastolic dysfunction (impaired relaxation).  2. Right ventricular systolic function is normal. The right ventricular size is normal. There is normal pulmonary artery systolic pressure. The estimated right ventricular systolic pressure is 27.5 mmHg. FINDINGS  Left Ventricle: Left ventricular ejection fraction, by estimation, is 60 to 65%. The left ventricle has normal function. The left ventricle has no regional wall motion abnormalities. The left ventricular internal cavity size was normal in size. There is  no left ventricular hypertrophy. Left ventricular diastolic parameters are consistent with Grade I diastolic dysfunction (impaired relaxation). Right Ventricle: The right ventricular size is normal. No increase in right ventricular wall thickness. Right ventricular systolic function is normal. There is normal pulmonary artery systolic pressure. The tricuspid  regurgitant velocity is 2.23 m/s, and  with an assumed right atrial pressure of 10 mmHg, the estimated right ventricular systolic pressure is 17.0 mmHg. Left Atrium: Left atrial size was normal in size. Right Atrium: Right atrial size was normal in size. Pericardium: There is no evidence of pericardial effusion. Mitral Valve: The mitral valve is normal in structure and function. Normal  mobility of the mitral valve leaflets. Mild mitral valve regurgitation. No evidence of mitral valve stenosis. Tricuspid Valve: The tricuspid valve is normal in structure. Tricuspid valve regurgitation is not demonstrated. No evidence of tricuspid stenosis. Aortic Valve: The aortic valve was not well visualized. Aortic valve regurgitation is trivial. No aortic stenosis is present. Aortic valve mean gradient measures 4.5 mmHg. Aortic valve peak gradient measures 8.1 mmHg. Aortic valve area, by VTI measures 2.85 cm. Pulmonic Valve: The pulmonic valve was normal in structure. Pulmonic valve regurgitation is not visualized. No evidence of pulmonic stenosis. Aorta: The aortic root is normal in size and structure. Venous: The inferior vena cava is normal in size with greater than 50% respiratory variability, suggesting right atrial pressure of 3 mmHg. IAS/Shunts: No atrial level shunt detected by color flow Doppler.  LEFT VENTRICLE PLAX 2D LVIDd:         4.27 cm  Diastology LVIDs:         2.65 cm  LV e' lateral:   7.07 cm/s LV PW:         1.35 cm  LV E/e' lateral: 11.3 LV IVS:        1.38 cm  LV e' medial:    4.57 cm/s LVOT diam:     2.00 cm  LV E/e' medial:  17.5 LV SV:         93.93 ml LV SV Index:   28.55 LVOT Area:     3.14 cm  RIGHT VENTRICLE RV Basal diam:  2.43 cm RV S prime:     9.90 cm/s TAPSE (M-mode): 3.0 cm LEFT ATRIUM             Index       RIGHT ATRIUM           Index LA diam:        3.70 cm 1.96 cm/m  RA Area:     10.00 cm LA Vol (A2C):   40.2 ml 21.27 ml/m RA Volume:   20.20 ml  10.69 ml/m LA Vol (A4C):   38.5 ml 20.37  ml/m LA Biplane Vol: 42.7 ml 22.60 ml/m  AORTIC VALVE AV Area (Vmax):    2.57 cm AV Area (Vmean):   2.67 cm AV Area (VTI):     2.85 cm AV Vmax:           142.00 cm/s AV Vmean:          98.850 cm/s AV VTI:            0.330 m AV Peak Grad:      8.1 mmHg AV Mean Grad:      4.5 mmHg LVOT Vmax:         116.00 cm/s LVOT Vmean:        84.000 cm/s LVOT VTI:          0.299 m LVOT/AV VTI ratio: 0.91  AORTA Ao Root diam: 3.40 cm MITRAL VALVE                TRICUSPID VALVE MV Area (PHT): 2.76 cm     TR Peak grad:   19.9 mmHg MV Decel Time: 275 msec     TR Vmax:        223.00 cm/s MV E velocity: 80.00 cm/s MV A velocity: 107.00 cm/s  SHUNTS MV E/A ratio:  0.75         Systemic VTI:  0.30 m  Systemic Diam: 2.00 cm Ida Rogue MD Electronically signed by Ida Rogue MD Signature Date/Time: 08/21/2019/11:31:29 AM    Final     Assessment: 86 y.o. female with a history of DM, HTN, HLD CKD and stroke in September of 2020 with resultant deficits of slurred speech and left sided weakness who reports worsening of her left sided symptoms.  Aphasia and left sided numbness and weakness noted on neurological examination.  Patient on Plavix and ASA prior to admission.  MRI of the brain personally reviewed and shows acute to subacute right MCA territory infarcts.  Same territory as previous infarcts. Concern is for artery to artery embolization.  Carotid dopplers show left atherosclerotic plaque with 50-69% stenosis and moderate right plaque.  Echocardiogram shows no cardiac source of emboli with an EF of 60-65%.  A1c 7.5, LDL 112.    Stroke Risk Factors - carotid stenosis, diabetes mellitus, hyperlipidemia and hypertension  Plan: 1. PT consult, OT consult, Speech consult 2. Due to low GFR patient to have MRA of the brain instead of CTA of the head and neck 3. Prophylactic therapy-Continue ASA and Plavix at current doses 4. NPO until RN stroke swallow screen 5. Telemetry monitoring 6.  Frequent neuro checks 7. Blood sugar management with target A1c<7.0 8. Aggressive lipid management with target LDL<70. 9. BP control with target BP<140/80   Alexis Goodell, MD Neurology [phone] 08/21/2019, 2:37 PM

## 2019-08-21 NOTE — Progress Notes (Signed)
Inpatient Rehabilitation Admissions Coordinator  I will place order for inpt rehab consult and a [facility] Admissions Coordinator will follow up with full assessment tomorrow.  Danne Baxter, RN, MSN Rehab Admissions Coordinator [phone] 08/21/2019 9:01 PM

## 2019-08-21 NOTE — Progress Notes (Signed)
Rehab Admissions Coordinator Note:  Patient was screened by Cleatrice Burke for appropriateness for an Inpatient Acute Rehab Consult per notification by RN CM. Noted PT is rec HH or OP and OT is recommending CIR. Pt is at a high functional level. I would like to observe how she progresses over next 24 hrs with therapy before knowing if she will need a CIR admit vs progresses rapidly to be able to go [facility] or OP therapy. I will follow up tomorrow.  Cleatrice Burke RN MSN 08/21/2019, 2:15 PM  I can be reached at [phone].

## 2019-08-21 NOTE — Progress Notes (Signed)
[facility] Progress Note  Patient: Brianna Dodson    EUM:[id number]  DOA: 08/20/2019     Date of Service: the patient was seen and examined on 08/21/2019  Chief Complaint  Patient presents with  . Possible Stroke   Brief hospital course: Kalayna Noy is a 86 y.o. female with Past medical history of CVA with residual left arm weakness, HTN, HLD, NIDDM, CKD stage IV, obesity, presented at [facility] ED complaining of left hand and arm weakness.  As per patient she had a stroke last year in September and October, her speech is little bit slurred and left arm weakness.  Patient stated that yesterday her speech got worse and she could not even say post office and today she dropped water from her left hand and pocketbook from her left hand so she came in for further evaluation.  Patient denied any chest pain or palpitations, no headache no dizziness, no shortness of breath.   ED Course: Vital signs are stable, temperature normal pulse 70-80, respiratory rate 14, BP 132/58 satting 97% on room air. Labs creatinine 2.26 baseline 2.17, GFR < 30, hemoglobin 9.7, coags within normal range CT Head: IMPRESSION: 1. Post ischemic changes within the right cerebral hemisphere appear progressed as compared to prior MRI 04/12/2019, most notably within the cortical/subcortical right frontal parietal lobes and mid to anterior right frontal lobe white matter. Some of these changes may be acute and MRI is recommended for further evaluation. 2. Redemonstrated background advanced chronic small vessel ischemic disease with moderate generalized parenchymal atrophy. 3. Frothy secretions within the right sphenoid sinus and posterior right ethmoid air cells. Correlate for acute sinusitis.  Currently further plan is to wait for MRI brain and neurology clearance.  Assessment and Plan: Principal problem   CVA (cerebral vascular accident) ([place]) Active Problems: NIDDM type II Hypertension Hyperlipidemia CKD stage  IV    #CVA, acute to subacute recurrent right MCA territory infarct  CT Head: Post ischemic changes within the right cerebral hemisphere appear progressed as compared to prior MRI 04/12/2019, most notably within the cortical/subcortical right frontal parietal lobes and mid to anterior right frontal lobe white matter. Some of these changes may be acute and MRI is recommended for further evaluation. MRI brain: Acute to subacute recurrent right MCA territory infarct, confluent in the operculum. No associated hemorrhage or mass effect. Carotid duplex: Left ICA 40 to 59% stenosis, right ICA moderate atherosclerosis, but not hemodynamically significant stenosis. TTE LVEF 60 to 65%, normal LV systolic function, grade 1 diastolic dysfunction. F/u MRA avoided CTA due to CKD 4  Started aspirin 81 mg and Plavix 75 mg, continued Crestor 40 mg daily PTA dose Hemoglobin A1c 7.5, TSH 1.03 normal, LDL 112, triglyceride 154 Patient has cardiac monitor, never diagnosed for arrhythmia.  Recommend to follow with cardiology as an outpatient Continue to monitor on telemetry, neurochecks as per protocol Continue fall precautions and aspiration precautions\ PT/OT eval done, Rec CIR,  Equipments 1 and 3 bedside commode, tub/shower seat.  SLP eval done rec dysphagia level 2 Acute inpatient rehab following will reassess tomorrow a.m. for disposition. Neurology consult appreciated, recommended MRA head and neck  # HTN, HLD continue with Crestor Resumed  amlodipine and lisinopril PTA dose Blood pressure soft, use hydralazine as needed Allowed permissive hypertension for 24 hours, now goal is normotensive  Continue to monitor BP and titrate medications accordingly  # NIDDM T2, held home medications for now. HbA1C 7.5 well-controlled Started NovoLog sliding scale Monitor FSBG and titrate insulin dose accordingly  #  CKD stage IV, creatinine slightly elevated 2.26 on admission Baseline creatinine  2.17 Started gentle hydration Cr 2.26> 2.01> Continue to monitor urine output and renal function  #Anemia of chronic disease, hemoglobin 9.7, stable continue to monitor   Body mass index is 29.89 kg/m.  Interventions:  Diet: Carb modified diet DVT Prophylaxis: Subcutaneous Lovenox   Advance goals of care discussion: Full code  Family Communication: no one from family was present at bedside, at the time of interview.  The pt provided permission to discuss medical plan with the family. Opportunity was given to ask question and all questions were answered satisfactorily.   Disposition:  Pt is from Home, admitted with CVA, still has left arm weakness and slurred speech, MRA head  pending which precludes a safe discharge. Discharge to [facility] vs CIR?, when MRA will be done and cleared by neurologist.  Subjective: No overnight issues, patient was seen and examined at bedside, patient still has slurred speech and left hand numbness and weakness.  Denied any new neurological findings.  Patient does not feel significant improvement as compared to yesterday. Patient denied any chest pain or palpitations, no shortness of breath.  Denied any headache or dizziness.  Physical Exam: General:  alert oriented to time, place, and person.  Appear in no distress, affect appropriate Eyes: PERRLA ENT: Oral Mucosa Clear, moist  Neck: no JVD,  Cardiovascular: S1 and S2 Present, no Murmur,  Respiratory: good respiratory effort, Bilateral Air entry equal and Decreased, no Crackles, no wheezes Abdomen: Bowel Sound present, Soft and no tenderness,  Skin: no rashes Extremities: no Pedal edema, no calf tenderness Neurologic: Left arm and hand weakness power 4 x 5, left hand numbness, rest is nonfocal and no new neurological findings. Gait not checked due to patient safety concerns  Vitals:   08/20/19 2004 08/21/19 0008 08/21/19 0431 08/21/19 0733  BP: (!) 162/78 (!) 145/60 (!) 129/58 (!) 167/61   Pulse: 72 65 67 64  Resp:    17  Temp: 98.4 F (36.9 C)  98.8 F (37.1 C) 97.8 F (36.6 C)  TempSrc: Oral  Oral Oral  SpO2: 99% 99% 95% 97%  Weight: 81.5 kg     Height: 5\' 5"  (1.651 m)       Intake/Output Summary (Last 24 hours) at 08/21/2019 1608 Last data filed at 08/21/2019 1500 Gross per 24 hour  Intake 774.18 ml  Output 0 ml  Net 774.18 ml   Filed Weights   08/20/19 1340 08/20/19 2004  Weight: 81.6 kg 81.5 kg    Data Reviewed: I have personally reviewed and interpreted daily labs, tele strips, imagings as discussed above. I reviewed all nursing notes, pharmacy notes, vitals, pertinent old records I have discussed plan of care as described above with RN and patient/family.  CBC: Recent Labs  Lab 08/20/19 1345 08/21/19 0534  WBC 4.1 4.1  NEUTROABS 2.8  --   HGB 9.7* 8.4*  HCT 29.0* 24.9*  MCV 85.0 83.8  PLT 190 601   Basic Metabolic Panel: Recent Labs  Lab 08/20/19 1345 08/21/19 0534  NA 139 141  K 4.7 4.6  CL 105 110  CO2 24 25  GLUCOSE 223* 117*  BUN 23 24*  CREATININE 2.26* 2.01*  CALCIUM 8.9 8.3*  MG  --  2.1  PHOS  --  3.7    Studies: MR Brain Wo Contrast (neuro protocol)  Result Date: 08/20/2019 CLINICAL DATA:  86 year old female with stroke-like symptoms. Ischemic changes progressed in the right hemisphere by  CT since October. EXAM: MRI HEAD WITHOUT CONTRAST TECHNIQUE: Multiplanar, multiecho pulse sequences of the brain and surrounding structures were obtained without intravenous contrast. COMPARISON:  Head CT earlier today. Brain MRI 04/12/2019. FINDINGS: Brain: Confluent restricted diffusion in the operculum and right lateral perirolandic cortex. Scattered involvement of the right insula. See series 5, image 28 and series 8, image 15. Associated T2 and FLAIR hyperintensity. Minimal T1 hypointensity. No evidence of associated hemorrhage. No mass effect. No left hemisphere or posterior fossa restricted diffusion. Small chronic infarct in the  right cerebellum. Patchy and confluent bilateral cerebral white matter T2 and FLAIR hyperintensity. A small area of right lateral occiput cortical encephalomalacia is new since October on series 7, image 32. There are chronic microhemorrhages in the bilateral thalamus. No midline shift, mass effect, evidence of mass lesion, ventriculomegaly, extra-axial collection or acute intracranial hemorrhage. Cervicomedullary junction and pituitary are within normal limits. Vascular: Major intracranial vascular flow voids are stable. Skull and upper cervical spine: Negative visible cervical spine. Normal bone marrow signal. Sinuses/Orbits: Stable and negative orbits. Mild bubbly opacity in the right posterior ethmoid and sphenoid air cells are stable. Other: Mastoids remain clear. Visible internal auditory structures appear normal. Scalp and face soft tissues appear negative. IMPRESSION: 1. Acute to subacute recurrent right MCA territory infarct, confluent in the operculum. No associated hemorrhage or mass effect. 2. Elsewhere stable underlying chronic ischemic and small vessel disease since October. Electronically Signed   By: Genevie Ann M.D.   On: 08/20/2019 17:54   US Carotid Bilateral (at [facility] and [facility] only)  Result Date: 08/21/2019 CLINICAL DATA:  CVA. Visual disturbance. History of hypertension, hyperlipidemia and diabetes. EXAM: BILATERAL CAROTID DUPLEX ULTRASOUND TECHNIQUE: Pearline Cables scale imaging, color Doppler and duplex ultrasound were performed of bilateral carotid and vertebral arteries in the neck. COMPARISON:  03/16/2019 FINDINGS: Criteria: Quantification of carotid stenosis is based on velocity parameters that correlate the residual internal carotid diameter with NASCET-based stenosis levels, using the diameter of the distal internal carotid lumen as the denominator for stenosis measurement. The following velocity measurements were obtained: RIGHT ICA: 86/20 cm/sec CCA: 18/2 cm/sec SYSTOLIC ICA/CCA RATIO:  1.3 ECA: 75  cm/sec LEFT ICA: 128/33 cm/sec CCA: 99/3 cm/sec SYSTOLIC ICA/CCA RATIO:  1.8 ECA: 139 cm/sec RIGHT CAROTID ARTERY: There is a moderate amount of eccentric echogenic plaque within the right carotid bulb (images 14 and 16), extending to involve the origin and proximal aspects of the right internal carotid artery (image 24), morphologically similar to the 03/2019 examination and again not resulting in elevated peak systolic velocities within the interrogated course of the right internal carotid artery to suggest a hemodynamically significant stenosis. RIGHT VERTEBRAL ARTERY:  Antegrade flow LEFT CAROTID ARTERY: There is a moderate to large amount of eccentric echogenic partially shadowing plaque within the left carotid bulb (image 49), extending to involve the origin and proximal aspects of the left internal carotid artery (image 57), morphologically similar to the 03/2019 examination though now results in borderline elevated peak systolic velocities within mid aspect the left internal carotid artery. Greatest acquired peak systolic velocity of the mid left ICA measures 128 centimeters/second (image 62), previously, 94 centimeters/second. LEFT VERTEBRAL ARTERY:  Antegrade flow Note is made of an approximately 1.4 x 1.3 x 1.3 cm hypoechoic nodule within the left lobe of the thyroid which appears to contain several internal macrocalcifications. IMPRESSION: 1. Moderate to large amount of left-sided atherosclerotic plaque, morphologically similar to the 03/2019 examination, though now results in elevated peak systolic velocities within the  left internal carotid artery compatible with the 50-69% luminal narrowing range. Further evaluation with CTA could performed as clinically indicated. 2. Moderate amount of right-sided atherosclerotic plaque, similar to the 03/2019 examination, and again not resulting in a hemodynamically significant stenosis. 3. Incidental note made of an approximately 1.4 cm hypoechoic nodule which is  noted to contain several internal macrocalcifications - further evaluation with dedicated thyroid ultrasound could be performed as clinically indicated. Electronically Signed   By: Sandi Mariscal M.D.   On: 08/21/2019 12:21   ECHOCARDIOGRAM COMPLETE  Result Date: 08/21/2019    ECHOCARDIOGRAM REPORT   Patient Name:   LEVIE OWENSBY Date of Exam: 08/21/2019 Medical Rec #:  [id number]     Height:       65.0 in Accession #:    [id number]    Weight:       179.6 lb Date of Birth:  1934/04/24      BSA:          1.89 m Patient Age:    51 years      BP:           167/61 mmHg Patient Gender: F             HR:           64 bpm. Exam Location:  [facility] Procedure: 2D Echo, Cardiac Doppler and Color Doppler Indications:     Stroke 434.91  History:         Patient has prior history of Echocardiogram examinations, most                  recent 04/14/2019.  Sonographer:     Sherrie Sport RDCS (AE) Referring Phys:  [id number] Val Riles Diagnosing Phys: Ida Rogue MD IMPRESSIONS  1. Left ventricular ejection fraction, by estimation, is 60 to 65%. The left ventricle has normal function. The left ventricle has no regional wall motion abnormalities. Left ventricular diastolic parameters are consistent with Grade I diastolic dysfunction (impaired relaxation).  2. Right ventricular systolic function is normal. The right ventricular size is normal. There is normal pulmonary artery systolic pressure. The estimated right ventricular systolic pressure is 58.0 mmHg. FINDINGS  Left Ventricle: Left ventricular ejection fraction, by estimation, is 60 to 65%. The left ventricle has normal function. The left ventricle has no regional wall motion abnormalities. The left ventricular internal cavity size was normal in size. There is  no left ventricular hypertrophy. Left ventricular diastolic parameters are consistent with Grade I diastolic dysfunction (impaired relaxation). Right Ventricle: The right ventricular size is normal. No increase in right  ventricular wall thickness. Right ventricular systolic function is normal. There is normal pulmonary artery systolic pressure. The tricuspid regurgitant velocity is 2.23 m/s, and  with an assumed right atrial pressure of 10 mmHg, the estimated right ventricular systolic pressure is 99.8 mmHg. Left Atrium: Left atrial size was normal in size. Right Atrium: Right atrial size was normal in size. Pericardium: There is no evidence of pericardial effusion. Mitral Valve: The mitral valve is normal in structure and function. Normal mobility of the mitral valve leaflets. Mild mitral valve regurgitation. No evidence of mitral valve stenosis. Tricuspid Valve: The tricuspid valve is normal in structure. Tricuspid valve regurgitation is not demonstrated. No evidence of tricuspid stenosis. Aortic Valve: The aortic valve was not well visualized. Aortic valve regurgitation is trivial. No aortic stenosis is present. Aortic valve mean gradient measures 4.5 mmHg. Aortic valve peak gradient measures 8.1 mmHg. Aortic valve area, by VTI measures  2.85 cm. Pulmonic Valve: The pulmonic valve was normal in structure. Pulmonic valve regurgitation is not visualized. No evidence of pulmonic stenosis. Aorta: The aortic root is normal in size and structure. Venous: The inferior vena cava is normal in size with greater than 50% respiratory variability, suggesting right atrial pressure of 3 mmHg. IAS/Shunts: No atrial level shunt detected by color flow Doppler.  LEFT VENTRICLE PLAX 2D LVIDd:         4.27 cm  Diastology LVIDs:         2.65 cm  LV e' lateral:   7.07 cm/s LV PW:         1.35 cm  LV E/e' lateral: 11.3 LV IVS:        1.38 cm  LV e' medial:    4.57 cm/s LVOT diam:     2.00 cm  LV E/e' medial:  17.5 LV SV:         93.93 ml LV SV Index:   28.55 LVOT Area:     3.14 cm  RIGHT VENTRICLE RV Basal diam:  2.43 cm RV S prime:     9.90 cm/s TAPSE (M-mode): 3.0 cm LEFT ATRIUM             Index       RIGHT ATRIUM           Index LA diam:         3.70 cm 1.96 cm/m  RA Area:     10.00 cm LA Vol (A2C):   40.2 ml 21.27 ml/m RA Volume:   20.20 ml  10.69 ml/m LA Vol (A4C):   38.5 ml 20.37 ml/m LA Biplane Vol: 42.7 ml 22.60 ml/m  AORTIC VALVE AV Area (Vmax):    2.57 cm AV Area (Vmean):   2.67 cm AV Area (VTI):     2.85 cm AV Vmax:           142.00 cm/s AV Vmean:          98.850 cm/s AV VTI:            0.330 m AV Peak Grad:      8.1 mmHg AV Mean Grad:      4.5 mmHg LVOT Vmax:         116.00 cm/s LVOT Vmean:        84.000 cm/s LVOT VTI:          0.299 m LVOT/AV VTI ratio: 0.91  AORTA Ao Root diam: 3.40 cm MITRAL VALVE                TRICUSPID VALVE MV Area (PHT): 2.76 cm     TR Peak grad:   19.9 mmHg MV Decel Time: 275 msec     TR Vmax:        223.00 cm/s MV E velocity: 80.00 cm/s MV A velocity: 107.00 cm/s  SHUNTS MV E/A ratio:  0.75         Systemic VTI:  0.30 m                             Systemic Diam: 2.00 cm Ida Rogue MD Electronically signed by Ida Rogue MD Signature Date/Time: 08/21/2019/11:31:29 AM    Final     Scheduled Meds: .  stroke: mapping our early stages of recovery book   Does not apply Once  . aspirin EC  81 mg Oral Q1200  . clopidogrel  75 mg Oral Daily  .  enoxaparin (LOVENOX) injection  30 mg Subcutaneous Q24H  . insulin aspart  0-15 Units Subcutaneous Q4H  . rosuvastatin  40 mg Oral Daily  . sodium chloride flush  3 mL Intravenous Once   Continuous Infusions: . sodium chloride 50 mL/hr at 08/21/19 1222   PRN Meds: acetaminophen **OR** acetaminophen (TYLENOL) oral liquid 160 mg/5 mL **OR** acetaminophen, hydrALAZINE, senna-docusate  Time spent: 35 minutes  Author: Val Riles. MD [facility] 08/21/2019 4:08 PM  To reach On-call, see care teams to locate the attending and reach out to them via [email or web address]. If 7PM-7AM, please contact night-coverage If you still have difficulty reaching the attending provider, please page the [facility] (Director on Call) for [facility] on amion for assistance.

## 2019-08-21 NOTE — Progress Notes (Signed)
*  PRELIMINARY RESULTS* Echocardiogram 2D Echocardiogram has been performed.  Sherrie Sport 08/21/2019, 9:38 AM

## 2019-08-22 ENCOUNTER — Inpatient Hospital Stay: Payer: Medicare Other

## 2019-08-22 LAB — BASIC METABOLIC PANEL
Anion gap: 7 (ref 5–15)
BUN: 24 mg/dL — ABNORMAL HIGH (ref 8–23)
CO2: 24 mmol/L (ref 22–32)
Calcium: 8 mg/dL — ABNORMAL LOW (ref 8.9–10.3)
Chloride: 110 mmol/L (ref 98–111)
Creatinine, Ser: 1.96 mg/dL — ABNORMAL HIGH (ref 0.44–1.00)
GFR calc Af Amer: 27 mL/min — ABNORMAL LOW (ref >60)
GFR calc non Af Amer: 23 mL/min — ABNORMAL LOW (ref >60)
Glucose, Bld: 144 mg/dL — ABNORMAL HIGH (ref 70–99)
Potassium: 4.5 mmol/L (ref 3.5–5.1)
Sodium: 141 mmol/L (ref 135–145)

## 2019-08-22 LAB — CBC
HCT: 26.3 % — ABNORMAL LOW (ref 36.0–46.0)
Hemoglobin: 8.8 g/dL — ABNORMAL LOW (ref 12.0–15.0)
MCH: 28.1 pg (ref 26.0–34.0)
MCHC: 33.5 g/dL (ref 30.0–36.0)
MCV: 84 fL (ref 80.0–100.0)
Platelets: 140 10*3/uL — ABNORMAL LOW (ref 150–400)
RBC: 3.13 MIL/uL — ABNORMAL LOW (ref 3.87–5.11)
RDW: 12.7 % (ref 11.5–15.5)
WBC: 3.9 10*3/uL — ABNORMAL LOW (ref 4.0–10.5)
nRBC: 0 % (ref 0.0–0.2)

## 2019-08-22 LAB — GLUCOSE, CAPILLARY
Glucose-Capillary: 144 mg/dL — ABNORMAL HIGH (ref 70–99)
Glucose-Capillary: 202 mg/dL — ABNORMAL HIGH (ref 70–99)

## 2019-08-22 MED ORDER — INSULIN ASPART 100 UNIT/ML ~~LOC~~ SOLN: 0.0000 [IU] | Freq: Every day | SUBCUTANEOUS | Status: DC

## 2019-08-22 MED ORDER — INSULIN ASPART 100 UNIT/ML ~~LOC~~ SOLN
0.0000 [IU] | Freq: Three times a day (TID) | SUBCUTANEOUS | Status: DC
  Administered 2019-08-22: 15:00:00 5 [IU] via SUBCUTANEOUS
  Administered 2019-08-22: 11:00:00 2 [IU] via SUBCUTANEOUS
  Filled 2019-08-22 (×2): qty 1

## 2019-08-22 NOTE — Progress Notes (Signed)
Subjective: No new neurological complaints  Objective: Current vital signs: BP (!) 164/63 (BP Location: Right Arm)   Pulse 62   Temp 97.6 F (36.4 C) (Oral)   Resp 16   Ht 5\' 5"  (1.651 m)   Wt 81.5 kg   SpO2 98%   BMI 29.89 kg/m  Vital signs in last 24 hours: Temp:  [97.6 F (36.4 C)-98.8 F (37.1 C)] 97.6 F (36.4 C) (02/19 0826) Pulse Rate:  [62-74] 62 (02/19 0826) Resp:  [16-17] 16 (02/19 0408) BP: (151-176)/(54-72) 164/63 (02/19 0826) SpO2:  [96 %-100 %] 98 % (02/19 0826)  Intake/Output from previous day: 02/18 0701 - 02/19 0700 In: 467.3 [I.V.:467.3] Out: 0  Intake/Output this shift: Total I/O In: 360 [P.O.:360] Out: -  Nutritional status:  Diet Order            DIET DYS 2 Room service appropriate? Yes with Assist; Fluid consistency: Nectar Thick  Diet effective now              Neurologic Exam: Mental Status: Alert, oriented, thought content appropriate.  Speech non- fluent.  Able to follow 3 step commands without difficulty. Cranial Nerves: II: Discs flat bilaterally; Visual fields grossly normal, pupils equal, round, reactive to light and accommodation III,IV, VI: ptosis not present, extra-ocular motions intact bilaterally V,VII: left facial droop, facial light touch sensation decreased on the left VIII: hearing normal bilaterally IX,X: gag reflex present XI: bilateral shoulder shrug XII: midline tongue extension Motor: Right :  Upper extremity   5/5                                      Left:     Upper extremity   4/5             Lower extremity   5/5                                                  Lower extremity   4-/5 Tone and bulk:normal tone throughout; no atrophy noted Sensory: Pinprick and light touch intact throughout, bilaterally   Lab Results: Basic Metabolic Panel: Recent Labs  Lab 08/20/19 1345 08/21/19 0534 08/22/19 0449  NA 139 141 141  K 4.7 4.6 4.5  CL 105 110 110  CO2 24 25 24   GLUCOSE 223* 117* 144*  BUN 23 24* 24*   CREATININE 2.26* 2.01* 1.96*  CALCIUM 8.9 8.3* 8.0*  MG  --  2.1  --   PHOS  --  3.7  --     Liver Function Tests: Recent Labs  Lab 08/20/19 1345  AST 25  ALT 15  ALKPHOS 46  BILITOT 0.9  PROT 7.1  ALBUMIN 3.5   No results for input(s): LIPASE, AMYLASE in the last 168 hours. No results for input(s): AMMONIA in the last 168 hours.  CBC: Recent Labs  Lab 08/20/19 1345 08/21/19 0534 08/22/19 0449  WBC 4.1 4.1 3.9*  NEUTROABS 2.8  --   --   HGB 9.7* 8.4* 8.8*  HCT 29.0* 24.9* 26.3*  MCV 85.0 83.8 84.0  PLT 190 156 140*    Cardiac Enzymes: No results for input(s): CKTOTAL, CKMB, CKMBINDEX, TROPONINI in the last 168 hours.  Lipid Panel: Recent Labs  Lab 08/21/19 0534  CHOL  172  TRIG 154*  HDL 29*  CHOLHDL 5.9  VLDL 31  LDLCALC 112*    CBG: Recent Labs  Lab 08/21/19 1155 08/21/19 1636 08/21/19 2017 08/21/19 2347 08/22/19 0828  GLUCAP 201* 132* 157* 131* 144*    Microbiology: Results for orders placed or performed during the hospital encounter of 08/20/19  Respiratory Panel by RT PCR (Flu A&B, Covid) - Nasopharyngeal Swab     Status: None   Collection Time: 08/20/19  4:21 PM   Specimen: Nasopharyngeal Swab  Result Value Ref Range Status   SARS Coronavirus 2 by RT PCR NEGATIVE NEGATIVE Final    Comment: (NOTE) SARS-CoV-2 target nucleic acids are NOT DETECTED. The SARS-CoV-2 RNA is generally detectable in upper respiratoy specimens during the acute phase of infection. The lowest concentration of SARS-CoV-2 viral copies this assay can detect is 131 copies/mL. A negative result does not preclude SARS-Cov-2 infection and should not be used as the sole basis for treatment or other patient management decisions. A negative result may occur with  improper specimen collection/handling, submission of specimen other than nasopharyngeal swab, presence of viral mutation(s) within the areas targeted by this assay, and inadequate number of viral copies (<131  copies/mL). A negative result must be combined with clinical observations, patient history, and epidemiological information. The expected result is Negative. Fact Sheet for Patients:  PinkCheek.be Fact Sheet for Healthcare Providers:  GravelBags.it This test is not yet ap proved or cleared by the [place] FDA and  has been authorized for detection and/or diagnosis of SARS-CoV-2 by FDA under an Emergency Use Authorization (EUA). This EUA will remain  in effect (meaning this test can be used) for the duration of the COVID-19 declaration under Section 564(b)(1) of the Act, 21 U.S.C. section 360bbb-3(b)(1), unless the authorization is terminated or revoked sooner.    Influenza A by PCR NEGATIVE NEGATIVE Final   Influenza B by PCR NEGATIVE NEGATIVE Final    Comment: (NOTE) The Xpert Xpress SARS-CoV-2/FLU/RSV assay is intended as an aid in  the diagnosis of influenza from Nasopharyngeal swab specimens and  should not be used as a sole basis for treatment. Nasal washings and  aspirates are unacceptable for Xpert Xpress SARS-CoV-2/FLU/RSV  testing. Fact Sheet for Patients: PinkCheek.be Fact Sheet for Healthcare Providers: GravelBags.it This test is not yet approved or cleared by the [place] FDA and  has been authorized for detection and/or diagnosis of SARS-CoV-2 by  FDA under an Emergency Use Authorization (EUA). This EUA will remain  in effect (meaning this test can be used) for the duration of the  Covid-19 declaration under Section 564(b)(1) of the Act, 21  U.S.C. section 360bbb-3(b)(1), unless the authorization is  terminated or revoked. Performed at [facility], [address] [place], [place] [address]     Coagulation Studies: Recent Labs    08/20/19 1345  LABPROT 14.0  INR 1.1    Imaging: CT HEAD WO CONTRAST  Result Date:  08/20/2019 CLINICAL DATA:  Possible stroke, focal neuro deficit, greater than 6 hours, stroke suspected. Additional history provided: Left arm weakness, previous stroke in October and September 2020, residual slurred speech EXAM: CT HEAD WITHOUT CONTRAST TECHNIQUE: Contiguous axial images were obtained from the base of the skull through the vertex without intravenous contrast. COMPARISON:  Brain MRI 04/12/2019 FINDINGS: Brain: There is no evidence of acute intracranial hemorrhage. Again demonstrated is patchy cortical and subcortical hypodensity within the right frontoparietal lobes and portions of the posterior right temporal lobe. Some of this  abnormal hypodensity corresponds with the infarcts which were acute at time of prior MRI 04/12/2019. However, some of these ischemic changes appear progressed as compared to this prior exam. For instance, within the cortical and subcortical right frontoparietal lobe (series 2, images 17-21) and within the mid to anterior right frontal lobe white matter. Redemonstrated small chronic cortical infarct within the inferior right parietal lobe (series 2, image 19). Background advanced chronic small vessel ischemic disease. Moderate generalized parenchymal atrophy. There is no evidence of intracranial mass. No midline shift or extra-axial fluid collection Vascular: No hyperdense vessel.  Atherosclerotic calcifications. Skull: No acute bony abnormality. Sinuses/Orbits: Visualized orbits demonstrate no acute abnormality. Frothy secretions within the right sphenoid sinus and posterior right ethmoid air cells. No significant mastoid effusion at the imaged levels. IMPRESSION: 1. Post ischemic changes within the right cerebral hemisphere appear progressed as compared to prior MRI 04/12/2019, most notably within the cortical/subcortical right frontal parietal lobes and mid to anterior right frontal lobe white matter. Some of these changes may be acute and MRI is recommended for further  evaluation. 2. Redemonstrated background advanced chronic small vessel ischemic disease with moderate generalized parenchymal atrophy. 3. Frothy secretions within the right sphenoid sinus and posterior right ethmoid air cells. Correlate for acute sinusitis. Electronically Signed   By: Kellie Simmering DO   On: 08/20/2019 14:29   MR ANGIO HEAD WO CONTRAST  Addendum Date: 08/21/2019   ADDENDUM REPORT: 08/21/2019 17:00 ADDENDUM: Study discussed by telephone with Dr. Alexis Goodell on 08/21/2019 at 1653 hours. Electronically Signed   By: Genevie Ann M.D.   On: 08/21/2019 17:00   Result Date: 08/21/2019 CLINICAL DATA:  86 year old female with recurrent right MCA territory infarct affecting the operculum. EXAM: MRA HEAD WITHOUT CONTRAST TECHNIQUE: Angiographic images of the Circle of Willis were obtained using MRA technique without intravenous contrast. COMPARISON:  Brain MRI 08/20/2019.  Neck MRA 04/12/2019. FINDINGS: Study is intermittently degraded by motion artifact despite repeated imaging attempts. Antegrade flow in the posterior circulation. Dominant appearing distal left vertebral artery. The right vertebral may functionally terminates in PICA. No definite distal vertebral stenosis. Tortuous basilar artery is patent with irregularity but only mild mid basilar stenosis. There are fetal type bilateral PCA origins. Proximal PCAs are patent, with up to mild irregularity. Grossly symmetric distal PCA flow signal. Antegrade flow in both ICA siphons. Evidence of calcified siphon atherosclerosis. But only mild siphon stenosis bilaterally. On the right side there is a prominent 3 mm outpouching which appears to be an infundibulum of the right ophthalmic artery origin (series 5, image 95). Both posterior communicating artery origins are within normal limits. Patent carotid termini. Patent MCA and ACA origins. The left A1 appears dominant and the right diminutive or absent. Anterior communicating artery is within normal  limits. There is a severe stenosis of the right ACA A2 visible on series 1056, image 6. There is preserved distal flow. The left MCA M1 segment and bifurcation are patent. Grossly normal left MCA branches. The right MCA M1 is patent but flow signal abruptly terminates at the level of the right MCA bifurcation (series 1056, image 9). There is patency of the right anterior temporal artery but no other convincing M2 branches. IMPRESSION: 1. Positive for large vessel occlusion of the Right MCA distal M1. The right anterior temporal artery remains patent but otherwise no significant collaterals are detected by MRA. 2. Generally mild intracranial stenosis related to atherosclerosis elsewhere, although a severe Right ACA A2 stenosis is also noted. 3. Infundibulum  of the right ophthalmic artery origin suspected (normal variant). Electronically Signed: By: Genevie Ann M.D. On: 08/21/2019 16:48   MR Brain Wo Contrast (neuro protocol)  Result Date: 08/20/2019 CLINICAL DATA:  86 year old female with stroke-like symptoms. Ischemic changes progressed in the right hemisphere by CT since October. EXAM: MRI HEAD WITHOUT CONTRAST TECHNIQUE: Multiplanar, multiecho pulse sequences of the brain and surrounding structures were obtained without intravenous contrast. COMPARISON:  Head CT earlier today. Brain MRI 04/12/2019. FINDINGS: Brain: Confluent restricted diffusion in the operculum and right lateral perirolandic cortex. Scattered involvement of the right insula. See series 5, image 28 and series 8, image 15. Associated T2 and FLAIR hyperintensity. Minimal T1 hypointensity. No evidence of associated hemorrhage. No mass effect. No left hemisphere or posterior fossa restricted diffusion. Small chronic infarct in the right cerebellum. Patchy and confluent bilateral cerebral white matter T2 and FLAIR hyperintensity. A small area of right lateral occiput cortical encephalomalacia is new since October on series 7, image 32. There are  chronic microhemorrhages in the bilateral thalamus. No midline shift, mass effect, evidence of mass lesion, ventriculomegaly, extra-axial collection or acute intracranial hemorrhage. Cervicomedullary junction and pituitary are within normal limits. Vascular: Major intracranial vascular flow voids are stable. Skull and upper cervical spine: Negative visible cervical spine. Normal bone marrow signal. Sinuses/Orbits: Stable and negative orbits. Mild bubbly opacity in the right posterior ethmoid and sphenoid air cells are stable. Other: Mastoids remain clear. Visible internal auditory structures appear normal. Scalp and face soft tissues appear negative. IMPRESSION: 1. Acute to subacute recurrent right MCA territory infarct, confluent in the operculum. No associated hemorrhage or mass effect. 2. Elsewhere stable underlying chronic ischemic and small vessel disease since October. Electronically Signed   By: Genevie Ann M.D.   On: 08/20/2019 17:54   US Carotid Bilateral (at [facility] and [facility] only)  Result Date: 08/21/2019 CLINICAL DATA:  CVA. Visual disturbance. History of hypertension, hyperlipidemia and diabetes. EXAM: BILATERAL CAROTID DUPLEX ULTRASOUND TECHNIQUE: Pearline Cables scale imaging, color Doppler and duplex ultrasound were performed of bilateral carotid and vertebral arteries in the neck. COMPARISON:  03/16/2019 FINDINGS: Criteria: Quantification of carotid stenosis is based on velocity parameters that correlate the residual internal carotid diameter with NASCET-based stenosis levels, using the diameter of the distal internal carotid lumen as the denominator for stenosis measurement. The following velocity measurements were obtained: RIGHT ICA: 86/20 cm/sec CCA: 41/6 cm/sec SYSTOLIC ICA/CCA RATIO:  1.3 ECA: 75 cm/sec LEFT ICA: 128/33 cm/sec CCA: 60/6 cm/sec SYSTOLIC ICA/CCA RATIO:  1.8 ECA: 139 cm/sec RIGHT CAROTID ARTERY: There is a moderate amount of eccentric echogenic plaque within the right carotid bulb (images 14 and  16), extending to involve the origin and proximal aspects of the right internal carotid artery (image 24), morphologically similar to the 03/2019 examination and again not resulting in elevated peak systolic velocities within the interrogated course of the right internal carotid artery to suggest a hemodynamically significant stenosis. RIGHT VERTEBRAL ARTERY:  Antegrade flow LEFT CAROTID ARTERY: There is a moderate to large amount of eccentric echogenic partially shadowing plaque within the left carotid bulb (image 49), extending to involve the origin and proximal aspects of the left internal carotid artery (image 57), morphologically similar to the 03/2019 examination though now results in borderline elevated peak systolic velocities within mid aspect the left internal carotid artery. Greatest acquired peak systolic velocity of the mid left ICA measures 128 centimeters/second (image 62), previously, 94 centimeters/second. LEFT VERTEBRAL ARTERY:  Antegrade flow Note is made of an approximately 1.4  x 1.3 x 1.3 cm hypoechoic nodule within the left lobe of the thyroid which appears to contain several internal macrocalcifications. IMPRESSION: 1. Moderate to large amount of left-sided atherosclerotic plaque, morphologically similar to the 03/2019 examination, though now results in elevated peak systolic velocities within the left internal carotid artery compatible with the 50-69% luminal narrowing range. Further evaluation with CTA could performed as clinically indicated. 2. Moderate amount of right-sided atherosclerotic plaque, similar to the 03/2019 examination, and again not resulting in a hemodynamically significant stenosis. 3. Incidental note made of an approximately 1.4 cm hypoechoic nodule which is noted to contain several internal macrocalcifications - further evaluation with dedicated thyroid ultrasound could be performed as clinically indicated. Electronically Signed   By: Sandi Mariscal M.D.   On: 08/21/2019  12:21   ECHOCARDIOGRAM COMPLETE  Result Date: 08/21/2019    ECHOCARDIOGRAM REPORT   Patient Name:   Brianna Dodson Date of Exam: 08/21/2019 Medical Rec #:  [id number]     Height:       65.0 in Accession #:    [id number]    Weight:       179.6 lb Date of Birth:  18-May-1934      BSA:          1.89 m Patient Age:    9 years      BP:           167/61 mmHg Patient Gender: F             HR:           64 bpm. Exam Location:  [facility] Procedure: 2D Echo, Cardiac Doppler and Color Doppler Indications:     Stroke 434.91  History:         Patient has prior history of Echocardiogram examinations, most                  recent 04/14/2019.  Sonographer:     Sherrie Sport RDCS (AE) Referring Phys:  [id number] Val Riles Diagnosing Phys: Ida Rogue MD IMPRESSIONS  1. Left ventricular ejection fraction, by estimation, is 60 to 65%. The left ventricle has normal function. The left ventricle has no regional wall motion abnormalities. Left ventricular diastolic parameters are consistent with Grade I diastolic dysfunction (impaired relaxation).  2. Right ventricular systolic function is normal. The right ventricular size is normal. There is normal pulmonary artery systolic pressure. The estimated right ventricular systolic pressure is 66.0 mmHg. FINDINGS  Left Ventricle: Left ventricular ejection fraction, by estimation, is 60 to 65%. The left ventricle has normal function. The left ventricle has no regional wall motion abnormalities. The left ventricular internal cavity size was normal in size. There is  no left ventricular hypertrophy. Left ventricular diastolic parameters are consistent with Grade I diastolic dysfunction (impaired relaxation). Right Ventricle: The right ventricular size is normal. No increase in right ventricular wall thickness. Right ventricular systolic function is normal. There is normal pulmonary artery systolic pressure. The tricuspid regurgitant velocity is 2.23 m/s, and  with an assumed right atrial pressure of 10  mmHg, the estimated right ventricular systolic pressure is 63.0 mmHg. Left Atrium: Left atrial size was normal in size. Right Atrium: Right atrial size was normal in size. Pericardium: There is no evidence of pericardial effusion. Mitral Valve: The mitral valve is normal in structure and function. Normal mobility of the mitral valve leaflets. Mild mitral valve regurgitation. No evidence of mitral valve stenosis. Tricuspid Valve: The tricuspid valve is normal in structure. Tricuspid valve regurgitation  is not demonstrated. No evidence of tricuspid stenosis. Aortic Valve: The aortic valve was not well visualized. Aortic valve regurgitation is trivial. No aortic stenosis is present. Aortic valve mean gradient measures 4.5 mmHg. Aortic valve peak gradient measures 8.1 mmHg. Aortic valve area, by VTI measures 2.85 cm. Pulmonic Valve: The pulmonic valve was normal in structure. Pulmonic valve regurgitation is not visualized. No evidence of pulmonic stenosis. Aorta: The aortic root is normal in size and structure. Venous: The inferior vena cava is normal in size with greater than 50% respiratory variability, suggesting right atrial pressure of 3 mmHg. IAS/Shunts: No atrial level shunt detected by color flow Doppler.  LEFT VENTRICLE PLAX 2D LVIDd:         4.27 cm  Diastology LVIDs:         2.65 cm  LV e' lateral:   7.07 cm/s LV PW:         1.35 cm  LV E/e' lateral: 11.3 LV IVS:        1.38 cm  LV e' medial:    4.57 cm/s LVOT diam:     2.00 cm  LV E/e' medial:  17.5 LV SV:         93.93 ml LV SV Index:   28.55 LVOT Area:     3.14 cm  RIGHT VENTRICLE RV Basal diam:  2.43 cm RV S prime:     9.90 cm/s TAPSE (M-mode): 3.0 cm LEFT ATRIUM             Index       RIGHT ATRIUM           Index LA diam:        3.70 cm 1.96 cm/m  RA Area:     10.00 cm LA Vol (A2C):   40.2 ml 21.27 ml/m RA Volume:   20.20 ml  10.69 ml/m LA Vol (A4C):   38.5 ml 20.37 ml/m LA Biplane Vol: 42.7 ml 22.60 ml/m  AORTIC VALVE AV Area (Vmax):    2.57  cm AV Area (Vmean):   2.67 cm AV Area (VTI):     2.85 cm AV Vmax:           142.00 cm/s AV Vmean:          98.850 cm/s AV VTI:            0.330 m AV Peak Grad:      8.1 mmHg AV Mean Grad:      4.5 mmHg LVOT Vmax:         116.00 cm/s LVOT Vmean:        84.000 cm/s LVOT VTI:          0.299 m LVOT/AV VTI ratio: 0.91  AORTA Ao Root diam: 3.40 cm MITRAL VALVE                TRICUSPID VALVE MV Area (PHT): 2.76 cm     TR Peak grad:   19.9 mmHg MV Decel Time: 275 msec     TR Vmax:        223.00 cm/s MV E velocity: 80.00 cm/s MV A velocity: 107.00 cm/s  SHUNTS MV E/A ratio:  0.75         Systemic VTI:  0.30 m                             Systemic Diam: 2.00 cm Ida Rogue MD Electronically signed by Ida Rogue MD  Signature Date/Time: 08/21/2019/11:31:29 AM    Final     Medications:  I have reviewed the patient's current medications. Scheduled: .  stroke: mapping our early stages of recovery book   Does not apply Once  . amLODipine  10 mg Oral Daily  . aspirin EC  81 mg Oral Q1200  . clopidogrel  75 mg Oral Daily  . enoxaparin (LOVENOX) injection  30 mg Subcutaneous Q24H  . insulin aspart  0-15 Units Subcutaneous TID WC  . insulin aspart  0-5 Units Subcutaneous QHS  . lisinopril  10 mg Oral Q1200  . rosuvastatin  40 mg Oral Daily  . sodium chloride flush  3 mL Intravenous Once    Assessment/Plan: 86 y.o. female with a history of DM, HTN, HLD CKD and stroke in September of 2020 with resultant deficits of slurred speech and left sided weakness who reports worsening of her left sided symptoms.  Aphasia and left sided numbness and weakness noted on neurological examination.  Patient on Plavix and ASA prior to admission.  MRI of the brain personally reviewed and shows acute to subacute right MCA territory infarcts.  Same territory as previous infarcts. Concern is for artery to artery embolization.  Carotid dopplers show left atherosclerotic plaque with 50-69% stenosis and moderate right plaque.  MRA  shows distal right M1 occlusion and severe right ACA A2 stenosis.  Chronicity of M1 stenosis in unclear but patient outside window for thrombectomy on presentation.  Echocardiogram shows no cardiac source of emboli with an EF of 60-65%.  A1c 7.5, LDL 112.  Recommendations: 1. Continue ASA and Plavix at current doses 2. Telemetry monitoring 3. Frequent neuro checks 4. Blood sugar management with target A1c<7.0 5. Aggressive lipid management with target LDL<70. 6. BP control with target BP<140/80 7. Patient to continue follow up with neurology on an outpatient basis   LOS: 2 days   Alexis Goodell, MD Neurology [phone] 08/22/2019  10:50 AM

## 2019-08-22 NOTE — Care Management Important Message (Signed)
Important Message  Patient Details  Name: Brianna Dodson MRN: [id number] Date of Birth: 19-Dec-1933   Medicare Important Message Given:  Yes     Dannette Barbara 08/22/2019, 11:56 AM

## 2019-08-22 NOTE — Progress Notes (Signed)
Inpatient Rehabilitation-Admissions Coordinator   CIR consult received. Noted pt is already performing at a high functional level, with pt Min G/Supervision for transfers, CGA for ADLs, and can even ambulate without an AD with CGA. Functional performance discussed with PM&R MD. Given pt's current functional level, her PLOF of needing some assistance at home, and available assistance at DC, would recommend outpatient vs HH.   AC will not pursue CIR at this time.   Raechel Ache, OTR/L  Rehab Admissions Coordinator  [phone] 08/22/2019 9:57 AM

## 2019-08-22 NOTE — Evaluation (Signed)
Objective Swallowing Evaluation: Type of Study: MBS-Modified Barium Swallow Study   Patient Details  Name: Brianna Dodson MRN: [id number] Date of Birth: [birth year]  Today's Date: 08/22/2019 Time: SLP Start Time (ACUTE ONLY): 1300 -SLP Stop Time (ACUTE ONLY): 1348  SLP Time Calculation (min) (ACUTE ONLY): 48 min   Past Medical History:  Past Medical History:  Diagnosis Date  . CKD (chronic kidney disease), stage III   . Diabetes mellitus without complication ([place])   . History of blood transfusion   . Hyperlipidemia   . Hypertension   . Stroke [facility])    Past Surgical History:  Past Surgical History:  Procedure Laterality Date  . ABDOMINAL HYSTERECTOMY  1985  . LOOP RECORDER INSERTION N/A 07/30/2019   Procedure: LOOP RECORDER INSERTION;  Surgeon: Isaias Cowman, MD;  Location: Home CV LAB;  Service: Cardiovascular;  Laterality: N/A;  . TEE WITHOUT CARDIOVERSION N/A 04/14/2019   Procedure: TRANSESOPHAGEAL ECHOCARDIOGRAM (TEE);  Surgeon: Teodoro Spray, MD;  Location: [facility] ORS;  Service: Cardiovascular;  Laterality: N/A;   HPI: Pt is a 86 y.o. female with Past medical history of CVA with residual left arm weakness, HTN, HLD, NIDDM, CKD stage IV, obesity, presented at [facility] ED complaining of left hand and arm weakness.  As per patient she had a stroke last year in September and October, her speech is little bit slurred and left arm weakness.  Patient stated that yesterday her speech got worse and she could not even say post office and today she dropped water from her left hand and pocketbook from her left hand so she came in for further evaluation.  Pt states her speech is a "little slurred and slow" due to strokes in Sept and Oct 2020 baseline; she endorsed she worked on her "vocabulary" then.    Subjective: Pt is alert, verbally responsive, sitting in MBS chair    Assessment / Plan / Recommendation  CHL IP CLINICAL IMPRESSIONS 08/22/2019  Clinical Impression The patient  is presenting with mild-moderate oropharyngeal dysphagia characterized by slow/disorganized oral management, anterior loss, oral residue, delayed pharyngeal swallow initiation, and transient laryngeal penetration of nectar-thick and thin liquids.  There was no observed pharyngeal residue or tracheal aspiration.  Recommend home health speech therapy for dysarthria and dysphagia.  SLP Visit Diagnosis Dysphagia, oropharyngeal phase (R13.12)  Attention and concentration deficit following --  Frontal lobe and executive function deficit following --  Impact on safety and function Mild aspiration risk      CHL IP TREATMENT RECOMMENDATION 08/21/2019  Treatment Recommendations Therapy as outlined in treatment plan below     Prognosis 08/22/2019  Prognosis for Safe Diet Advancement Good  Barriers to Reach Goals --  Barriers/Prognosis Comment --    CHL IP DIET RECOMMENDATION 08/22/2019  SLP Diet Recommendations Dysphagia 3 (Mech soft) solids;Thin liquid  Liquid Administration via Cup  Medication Administration Whole meds with puree  Compensations Minimize environmental distractions;Small sips/bites;Lingual sweep for clearance of pocketing  Postural Changes Remain semi-upright after after feeds/meals (Comment);Seated upright at 90 degrees      CHL IP OTHER RECOMMENDATIONS 08/22/2019  Recommended Consults --  Oral Care Recommendations Oral care BID  Other Recommendations --      CHL IP FOLLOW UP RECOMMENDATIONS 08/22/2019  Follow up Recommendations Home health SLP      CHL IP FREQUENCY AND DURATION 08/21/2019  Speech Therapy Frequency (ACUTE ONLY) (No Data)  Treatment Duration --           CHL IP ORAL PHASE 08/22/2019  Oral  Phase Impaired  Oral - Pudding Teaspoon --  Oral - Pudding Cup --  Oral - Honey Teaspoon --  Oral - Honey Cup --  Oral - Nectar Teaspoon --  Oral - Nectar Cup --  Oral - Nectar Straw --  Oral - Thin Teaspoon --  Oral - Thin Cup --  Oral - Thin Straw --  Oral -  Puree --  Oral - Mech Soft --  Oral - Regular --  Oral - Multi-Consistency --  Oral - Pill --  Oral Phase - Comment slow/disorganized oral management, anterior loss, oral residue    CHL IP PHARYNGEAL PHASE 08/22/2019  Pharyngeal Phase Impaired  Pharyngeal- Pudding Teaspoon --  Pharyngeal --  Pharyngeal- Pudding Cup --  Pharyngeal --  Pharyngeal- Honey Teaspoon --  Pharyngeal --  Pharyngeal- Honey Cup --  Pharyngeal --  Pharyngeal- Nectar Teaspoon --  Pharyngeal --  Pharyngeal- Nectar Cup --  Pharyngeal --  Pharyngeal- Nectar Straw --  Pharyngeal --  Pharyngeal- Thin Teaspoon --  Pharyngeal --  Pharyngeal- Thin Cup --  Pharyngeal --  Pharyngeal- Thin Straw --  Pharyngeal --  Pharyngeal- Puree --  Pharyngeal --  Pharyngeal- Mechanical Soft --  Pharyngeal --  Pharyngeal- Regular --  Pharyngeal --  Pharyngeal- Multi-consistency --  Pharyngeal --  Pharyngeal- Pill --  Pharyngeal --  Pharyngeal Comment Transient laryngeal penetration with nectar-thick and thin liquids     CHL IP CERVICAL ESOPHAGEAL PHASE 08/22/2019  Cervical Esophageal Phase WFL  Pudding Teaspoon --  Pudding Cup --  Honey Teaspoon --  Honey Cup --  Nectar Teaspoon --  Nectar Cup --  Nectar Straw --  Thin Teaspoon --  Thin Cup --  Thin Straw --  Puree --  Mechanical Soft --  Regular --  Multi-consistency --  Pill --  Cervical Esophageal Comment --    Leroy Sea, MS/CCC- SLP  Valetta Fuller, Susie 08/22/2019, 2:04 PM

## 2019-08-22 NOTE — Discharge Summary (Signed)
[facility] Discharge Summary   Patient: Brianna Dodson WOE:[id number]  PCP: [name], MD  Date of admission: 08/20/2019   Date of discharge:  08/22/2019     Discharge Diagnoses:  Principal diagnosis Right MCA stroke  Principal Problem:   CVA (cerebral vascular accident) [facility]) Active Problems:   Essential hypertension   DM type 2 (diabetes mellitus, type 2) ([place])   CKD stage 4 due to type 2 diabetes mellitus ([place])   Anemia   Admitted From: Home Disposition:  Home with HA/PT/OT and speech  Recommendations for Outpatient Follow-up:  1. PCP: in1 wk 2. F/u Neuro in 1-2 wks   Diet recommendation: Cardiac diet  Activity: The patient is advised to gradually reintroduce usual activities, as tolerated  Discharge Condition: stable  Code Status: Full code   History of present illness: As per the H and P dictated on admission ofCVA with residual left arm weakness, HTN, HLD, NIDDM, CKD stage IV, obesity,presented at [facility] ED complaining of left hand and arm weakness.As per patient she had a stroke last year in September and October, her speech is little bit slurred and left arm weakness. Patient stated that yesterday her speech got worse and she could not even say post office and today she dropped water from her left hand and pocketbook from her left hand so she came in for further evaluation. Patient denied any chest pain or palpitations, no headache no dizziness, no shortness of breath.   ED Course:Vital signs are stable, temperature normal pulse 70-80, respiratory rate 14, BP 132/58 satting 97% on room air. Labs creatinine 2.26 baseline 2.17,GFR<30,hemoglobin 9.7,coags within normal range CT Head:IMPRESSION: 1. Post ischemic changes within the right cerebral hemisphere appear progressed as compared to prior MRI 04/12/2019, most notably within the cortical/subcortical right frontal parietal lobes and mid to anterior right frontal lobe white matter. Some of  these changes may be acute and MRI is recommended for further evaluation. 2. Redemonstrated background advanced chronic small vessel ischemic disease with moderate generalized parenchymal atrophy. 3. Frothy secretions within the right sphenoid sinus and posterior right ethmoid air cells. Correlate for acute sinusitis.  Hospital Course:  #CVA, acute to subacute recurrent right MCA territory infarct  CT Head:Post ischemic changes within the right cerebral hemisphere appear progressed as compared to prior MRI 04/12/2019, most notably within the cortical/subcortical right frontal parietal lobes and mid to anterior right frontal lobe white matter. Some of these changes may be acute and MRI is recommended for further evaluation. MRI brain: Acute to subacute recurrent right MCA territory infarct, confluent in the operculum. No associated hemorrhage or mass effect. Carotid duplex: Left ICA 40 to 59% stenosis, right ICA moderate atherosclerosis, but not hemodynamically significant stenosis. TTE LVEF 60 to 65%, normal LV systolic function, grade 1 diastolic dysfunction. MRA brain, avoided CTA due to CKD 4: 1. Positive for large vessel occlusion of the Right MCA distal M1. The right anterior temporal artery remains patent but otherwise no significant collaterals are detected by MRA. 2. Generally mild intracranial stenosis related to atherosclerosis elsewhere, although a severe Right ACA A2 stenosis is also noted. 3. Infundibulum of the right ophthalmic artery origin suspected (normal variant). Continued aspirin 81 mg and Plavix 75 mg, Crestor 40 mg dailyPTA dose Hemoglobin A1c 7.5, TSH 1.03 normal, LDL 112, triglyceride 154 Patient has cardiac monitor, never diagnosed for arrhythmia.  Recommend to follow with cardiology as an outpatient. neurochecks as per protocol was done, no new medical findings.. He was seen by PT and OT  recommended home health, complaints arranged, seen by speech and swallow eval,  recommended dysphagia level 2 diet and speech therapy at home. Neurology consult appreciated, recommended no intervention for right MCA M1 occlusion as patient was out of the window for thrombectomy at presentation.  Follow-up as an outpatient. # HTN, HLDcontinue with Crestor, Resumed  amlodipine and lisinopril PTA dose Allowed permissive hypertension for 24 hours, now goal is normotensive. #NIDDM T2,Resumed home medications. HbA1C 7.5 well-controlled Monitor FSBG and f/u pcp #CKD stage IV,creatinine slightly elevated 2.26 on admission Baseline creatinine 2.17, s.p gentle hydration, Cr 2.26> 2.01>1.96 #Anemia of chronic disease,hemoglobin 9.7>8.8 possible dilation.  Follow with PCP. Body mass index is 29.89 kg/m.  Nutrition Interventions: none  No opiates pain control    - [place] Controlled Substance Reporting System database was not reviewed. - Patient was instructed, not to drive, operate heavy machinery, perform activities at heights, swimming or participation in water activities or provide baby sitting services while on Pain, Sleep and Anxiety Medications; until her outpatient Physician has advised to do so again.  - Also recommended to not to take more than prescribed Pain, Sleep and Anxiety Medications.  Patient was ambulatory with assistance. Patient was seen by physical therapy, who recommended Home health, which was arranged. On the day of the discharge the patient's vitals were stable, and no other acute medical condition were reported by patient. the patient was felt safe to be discharge at Home with Home health, PT/OT/Speech  Consultants: Neurology Procedures: None  Discharge Exam: General: Appear in no distress, no Rash; Oral Mucosa Clear, moist. Cardiovascular: S1 and S2 Present, no Murmur, Respiratory: no normal respiratory effort, Bilateral Air entry present and no Crackles, no wheezes Abdomen: Bowel Sound present, Soft and no tenderness, no  hernia Extremities: no Pedal edema, no calf tenderness Neurology: alert and oriented to time, place, and person. Right arm weakness power 4/5, improving power and sensation.  No neurological changes, speech is slurred and dysarthric.  But improving. affect appropriate.  Filed Weights   08/20/19 1340 08/20/19 2004  Weight: 81.6 kg 81.5 kg   Vitals:   08/22/19 0826 08/22/19 1219  BP: (!) 164/63 (!) 157/80  Pulse: 62 70  Resp:    Temp: 97.6 F (36.4 C) 98.1 F (36.7 C)  SpO2: 98% 100%    DISCHARGE MEDICATION: Allergies as of 08/22/2019      Reactions   Nsaids    CKD stage III - Avoid all nephrotoxic drugs      Medication List    TAKE these medications   Accu-Chek Guide test strip Generic drug: glucose blood USE TO CHECK BLOOD SUGARS TWICE DAILY. E11.9   amLODipine 10 MG tablet Commonly known as: NORVASC TAKE 1 TABLET BY MOUTH EVERY DAY   aspirin 81 MG tablet Take 81 mg by mouth daily at 12 noon.   blood glucose meter kit and supplies Dispense based on patient and insurance preference. Use up to four times daily as directed. E11.9   blood glucose meter kit and supplies Kit Dispense based on patient and insurance preference. Check CBGs two times daily. E11.9.   clopidogrel 75 MG tablet Commonly known as: PLAVIX Take 1 tablet (75 mg total) by mouth daily. What changed: Another medication with the same name was removed. Continue taking this medication, and follow the directions you see here.   glimepiride 1 MG tablet Commonly known as: AMARYL TAKE 3 TABLETS (3 MG TOTAL) BY MOUTH DAILY WITH BREAKFAST. What changed: Another medication with the same  name was removed. Continue taking this medication, and follow the directions you see here.   Iron 28 MG Tabs Take 28 mg by mouth daily at 12 noon.   lisinopril 10 MG tablet Commonly known as: ZESTRIL Take 1 tablet (10 mg total) by mouth daily. What changed: when to take this   onetouch ultrasoft lancets Use as  instructed   rosuvastatin 40 MG tablet Commonly known as: CRESTOR TAKE 1 TABLET BY MOUTH EVERY DAY   Vitamin D (Ergocalciferol) 1.25 MG (50000 UNIT) Caps capsule Commonly known as: DRISDOL Take 50,000 Units by mouth every 30 (thirty) days.            Durable Medical Equipment  (From admission, onward)         Start     Ordered   08/22/19 1213  For home use only DME Walker rolling  Once    Question Answer Comment  Walker: With 5 Inch Wheels   Patient needs a walker to treat with the following condition Weakness      08/22/19 1212         Allergies  Allergen Reactions  . Nsaids     CKD stage III - Avoid all nephrotoxic drugs   Discharge Instructions    Diet - low sodium heart healthy   Complete by: As directed    Discharge instructions   Complete by: As directed    Follow-up with PCP in 1 week Follow-up with neurology as an outpatient and 1 to 2 weeks   Increase activity slowly   Complete by: As directed       The results of significant diagnostics from this hospitalization (including imaging, microbiology, ancillary and laboratory) are listed below for reference.    Significant Diagnostic Studies: CT HEAD WO CONTRAST  Result Date: 08/20/2019 CLINICAL DATA:  Possible stroke, focal neuro deficit, greater than 6 hours, stroke suspected. Additional history provided: Left arm weakness, previous stroke in October and September 2020, residual slurred speech EXAM: CT HEAD WITHOUT CONTRAST TECHNIQUE: Contiguous axial images were obtained from the base of the skull through the vertex without intravenous contrast. COMPARISON:  Brain MRI 04/12/2019 FINDINGS: Brain: There is no evidence of acute intracranial hemorrhage. Again demonstrated is patchy cortical and subcortical hypodensity within the right frontoparietal lobes and portions of the posterior right temporal lobe. Some of this abnormal hypodensity corresponds with the infarcts which were acute at time of prior MRI  04/12/2019. However, some of these ischemic changes appear progressed as compared to this prior exam. For instance, within the cortical and subcortical right frontoparietal lobe (series 2, images 17-21) and within the mid to anterior right frontal lobe white matter. Redemonstrated small chronic cortical infarct within the inferior right parietal lobe (series 2, image 19). Background advanced chronic small vessel ischemic disease. Moderate generalized parenchymal atrophy. There is no evidence of intracranial mass. No midline shift or extra-axial fluid collection Vascular: No hyperdense vessel.  Atherosclerotic calcifications. Skull: No acute bony abnormality. Sinuses/Orbits: Visualized orbits demonstrate no acute abnormality. Frothy secretions within the right sphenoid sinus and posterior right ethmoid air cells. No significant mastoid effusion at the imaged levels. IMPRESSION: 1. Post ischemic changes within the right cerebral hemisphere appear progressed as compared to prior MRI 04/12/2019, most notably within the cortical/subcortical right frontal parietal lobes and mid to anterior right frontal lobe white matter. Some of these changes may be acute and MRI is recommended for further evaluation. 2. Redemonstrated background advanced chronic small vessel ischemic disease with moderate generalized parenchymal atrophy.  3. Frothy secretions within the right sphenoid sinus and posterior right ethmoid air cells. Correlate for acute sinusitis. Electronically Signed   By: Kellie Simmering DO   On: 08/20/2019 14:29   MR ANGIO HEAD WO CONTRAST  Addendum Date: 08/21/2019   ADDENDUM REPORT: 08/21/2019 17:00 ADDENDUM: Study discussed by telephone with Dr. Alexis Goodell on 08/21/2019 at 1653 hours. Electronically Signed   By: Genevie Ann M.D.   On: 08/21/2019 17:00   Result Date: 08/21/2019 CLINICAL DATA:  86 year old female with recurrent right MCA territory infarct affecting the operculum. EXAM: MRA HEAD WITHOUT CONTRAST  TECHNIQUE: Angiographic images of the Circle of Willis were obtained using MRA technique without intravenous contrast. COMPARISON:  Brain MRI 08/20/2019.  Neck MRA 04/12/2019. FINDINGS: Study is intermittently degraded by motion artifact despite repeated imaging attempts. Antegrade flow in the posterior circulation. Dominant appearing distal left vertebral artery. The right vertebral may functionally terminates in PICA. No definite distal vertebral stenosis. Tortuous basilar artery is patent with irregularity but only mild mid basilar stenosis. There are fetal type bilateral PCA origins. Proximal PCAs are patent, with up to mild irregularity. Grossly symmetric distal PCA flow signal. Antegrade flow in both ICA siphons. Evidence of calcified siphon atherosclerosis. But only mild siphon stenosis bilaterally. On the right side there is a prominent 3 mm outpouching which appears to be an infundibulum of the right ophthalmic artery origin (series 5, image 95). Both posterior communicating artery origins are within normal limits. Patent carotid termini. Patent MCA and ACA origins. The left A1 appears dominant and the right diminutive or absent. Anterior communicating artery is within normal limits. There is a severe stenosis of the right ACA A2 visible on series 1056, image 6. There is preserved distal flow. The left MCA M1 segment and bifurcation are patent. Grossly normal left MCA branches. The right MCA M1 is patent but flow signal abruptly terminates at the level of the right MCA bifurcation (series 1056, image 9). There is patency of the right anterior temporal artery but no other convincing M2 branches. IMPRESSION: 1. Positive for large vessel occlusion of the Right MCA distal M1. The right anterior temporal artery remains patent but otherwise no significant collaterals are detected by MRA. 2. Generally mild intracranial stenosis related to atherosclerosis elsewhere, although a severe Right ACA A2 stenosis is also  noted. 3. Infundibulum of the right ophthalmic artery origin suspected (normal variant). Electronically Signed: By: Genevie Ann M.D. On: 08/21/2019 16:48   MR Brain Wo Contrast (neuro protocol)  Result Date: 08/20/2019 CLINICAL DATA:  86 year old female with stroke-like symptoms. Ischemic changes progressed in the right hemisphere by CT since October. EXAM: MRI HEAD WITHOUT CONTRAST TECHNIQUE: Multiplanar, multiecho pulse sequences of the brain and surrounding structures were obtained without intravenous contrast. COMPARISON:  Head CT earlier today. Brain MRI 04/12/2019. FINDINGS: Brain: Confluent restricted diffusion in the operculum and right lateral perirolandic cortex. Scattered involvement of the right insula. See series 5, image 28 and series 8, image 15. Associated T2 and FLAIR hyperintensity. Minimal T1 hypointensity. No evidence of associated hemorrhage. No mass effect. No left hemisphere or posterior fossa restricted diffusion. Small chronic infarct in the right cerebellum. Patchy and confluent bilateral cerebral white matter T2 and FLAIR hyperintensity. A small area of right lateral occiput cortical encephalomalacia is new since October on series 7, image 32. There are chronic microhemorrhages in the bilateral thalamus. No midline shift, mass effect, evidence of mass lesion, ventriculomegaly, extra-axial collection or acute intracranial hemorrhage. Cervicomedullary junction and pituitary are  within normal limits. Vascular: Major intracranial vascular flow voids are stable. Skull and upper cervical spine: Negative visible cervical spine. Normal bone marrow signal. Sinuses/Orbits: Stable and negative orbits. Mild bubbly opacity in the right posterior ethmoid and sphenoid air cells are stable. Other: Mastoids remain clear. Visible internal auditory structures appear normal. Scalp and face soft tissues appear negative. IMPRESSION: 1. Acute to subacute recurrent right MCA territory infarct, confluent in the  operculum. No associated hemorrhage or mass effect. 2. Elsewhere stable underlying chronic ischemic and small vessel disease since October. Electronically Signed   By: Genevie Ann M.D.   On: 08/20/2019 17:54   US Carotid Bilateral (at [facility] and [facility] only)  Result Date: 08/21/2019 CLINICAL DATA:  CVA. Visual disturbance. History of hypertension, hyperlipidemia and diabetes. EXAM: BILATERAL CAROTID DUPLEX ULTRASOUND TECHNIQUE: Pearline Cables scale imaging, color Doppler and duplex ultrasound were performed of bilateral carotid and vertebral arteries in the neck. COMPARISON:  03/16/2019 FINDINGS: Criteria: Quantification of carotid stenosis is based on velocity parameters that correlate the residual internal carotid diameter with NASCET-based stenosis levels, using the diameter of the distal internal carotid lumen as the denominator for stenosis measurement. The following velocity measurements were obtained: RIGHT ICA: 86/20 cm/sec CCA: 92/3 cm/sec SYSTOLIC ICA/CCA RATIO:  1.3 ECA: 75 cm/sec LEFT ICA: 128/33 cm/sec CCA: 30/0 cm/sec SYSTOLIC ICA/CCA RATIO:  1.8 ECA: 139 cm/sec RIGHT CAROTID ARTERY: There is a moderate amount of eccentric echogenic plaque within the right carotid bulb (images 14 and 16), extending to involve the origin and proximal aspects of the right internal carotid artery (image 24), morphologically similar to the 03/2019 examination and again not resulting in elevated peak systolic velocities within the interrogated course of the right internal carotid artery to suggest a hemodynamically significant stenosis. RIGHT VERTEBRAL ARTERY:  Antegrade flow LEFT CAROTID ARTERY: There is a moderate to large amount of eccentric echogenic partially shadowing plaque within the left carotid bulb (image 49), extending to involve the origin and proximal aspects of the left internal carotid artery (image 57), morphologically similar to the 03/2019 examination though now results in borderline elevated peak systolic velocities  within mid aspect the left internal carotid artery. Greatest acquired peak systolic velocity of the mid left ICA measures 128 centimeters/second (image 62), previously, 94 centimeters/second. LEFT VERTEBRAL ARTERY:  Antegrade flow Note is made of an approximately 1.4 x 1.3 x 1.3 cm hypoechoic nodule within the left lobe of the thyroid which appears to contain several internal macrocalcifications. IMPRESSION: 1. Moderate to large amount of left-sided atherosclerotic plaque, morphologically similar to the 03/2019 examination, though now results in elevated peak systolic velocities within the left internal carotid artery compatible with the 50-69% luminal narrowing range. Further evaluation with CTA could performed as clinically indicated. 2. Moderate amount of right-sided atherosclerotic plaque, similar to the 03/2019 examination, and again not resulting in a hemodynamically significant stenosis. 3. Incidental note made of an approximately 1.4 cm hypoechoic nodule which is noted to contain several internal macrocalcifications - further evaluation with dedicated thyroid ultrasound could be performed as clinically indicated. Electronically Signed   By: Sandi Mariscal M.D.   On: 08/21/2019 12:21   EP PPM/ICD IMPLANT  Result Date: 07/30/2019 Successful Reveal LINQ implantation  ECHOCARDIOGRAM COMPLETE  Result Date: 08/21/2019    ECHOCARDIOGRAM REPORT   Patient Name:   TYNLEE BAYLE Date of Exam: 08/21/2019 Medical Rec #:  [id number]     Height:       65.0 in Accession #:    [id number]  Weight:       179.6 lb Date of Birth:  1934/04/30      BSA:          1.89 m Patient Age:    35 years      BP:           167/61 mmHg Patient Gender: F             HR:           64 bpm. Exam Location:  [facility] Procedure: 2D Echo, Cardiac Doppler and Color Doppler Indications:     Stroke 434.91  History:         Patient has prior history of Echocardiogram examinations, most                  recent 04/14/2019.  Sonographer:     Sherrie Sport RDCS  (AE) Referring Phys:  [id number] Val Riles Diagnosing Phys: Ida Rogue MD IMPRESSIONS  1. Left ventricular ejection fraction, by estimation, is 60 to 65%. The left ventricle has normal function. The left ventricle has no regional wall motion abnormalities. Left ventricular diastolic parameters are consistent with Grade I diastolic dysfunction (impaired relaxation).  2. Right ventricular systolic function is normal. The right ventricular size is normal. There is normal pulmonary artery systolic pressure. The estimated right ventricular systolic pressure is 29.2 mmHg. FINDINGS  Left Ventricle: Left ventricular ejection fraction, by estimation, is 60 to 65%. The left ventricle has normal function. The left ventricle has no regional wall motion abnormalities. The left ventricular internal cavity size was normal in size. There is  no left ventricular hypertrophy. Left ventricular diastolic parameters are consistent with Grade I diastolic dysfunction (impaired relaxation). Right Ventricle: The right ventricular size is normal. No increase in right ventricular wall thickness. Right ventricular systolic function is normal. There is normal pulmonary artery systolic pressure. The tricuspid regurgitant velocity is 2.23 m/s, and  with an assumed right atrial pressure of 10 mmHg, the estimated right ventricular systolic pressure is 44.6 mmHg. Left Atrium: Left atrial size was normal in size. Right Atrium: Right atrial size was normal in size. Pericardium: There is no evidence of pericardial effusion. Mitral Valve: The mitral valve is normal in structure and function. Normal mobility of the mitral valve leaflets. Mild mitral valve regurgitation. No evidence of mitral valve stenosis. Tricuspid Valve: The tricuspid valve is normal in structure. Tricuspid valve regurgitation is not demonstrated. No evidence of tricuspid stenosis. Aortic Valve: The aortic valve was not well visualized. Aortic valve regurgitation is trivial. No  aortic stenosis is present. Aortic valve mean gradient measures 4.5 mmHg. Aortic valve peak gradient measures 8.1 mmHg. Aortic valve area, by VTI measures 2.85 cm. Pulmonic Valve: The pulmonic valve was normal in structure. Pulmonic valve regurgitation is not visualized. No evidence of pulmonic stenosis. Aorta: The aortic root is normal in size and structure. Venous: The inferior vena cava is normal in size with greater than 50% respiratory variability, suggesting right atrial pressure of 3 mmHg. IAS/Shunts: No atrial level shunt detected by color flow Doppler.  LEFT VENTRICLE PLAX 2D LVIDd:         4.27 cm  Diastology LVIDs:         2.65 cm  LV e' lateral:   7.07 cm/s LV PW:         1.35 cm  LV E/e' lateral: 11.3 LV IVS:        1.38 cm  LV e' medial:    4.57 cm/s LVOT  diam:     2.00 cm  LV E/e' medial:  17.5 LV SV:         93.93 ml LV SV Index:   28.55 LVOT Area:     3.14 cm  RIGHT VENTRICLE RV Basal diam:  2.43 cm RV S prime:     9.90 cm/s TAPSE (M-mode): 3.0 cm LEFT ATRIUM             Index       RIGHT ATRIUM           Index LA diam:        3.70 cm 1.96 cm/m  RA Area:     10.00 cm LA Vol (A2C):   40.2 ml 21.27 ml/m RA Volume:   20.20 ml  10.69 ml/m LA Vol (A4C):   38.5 ml 20.37 ml/m LA Biplane Vol: 42.7 ml 22.60 ml/m  AORTIC VALVE AV Area (Vmax):    2.57 cm AV Area (Vmean):   2.67 cm AV Area (VTI):     2.85 cm AV Vmax:           142.00 cm/s AV Vmean:          98.850 cm/s AV VTI:            0.330 m AV Peak Grad:      8.1 mmHg AV Mean Grad:      4.5 mmHg LVOT Vmax:         116.00 cm/s LVOT Vmean:        84.000 cm/s LVOT VTI:          0.299 m LVOT/AV VTI ratio: 0.91  AORTA Ao Root diam: 3.40 cm MITRAL VALVE                TRICUSPID VALVE MV Area (PHT): 2.76 cm     TR Peak grad:   19.9 mmHg MV Decel Time: 275 msec     TR Vmax:        223.00 cm/s MV E velocity: 80.00 cm/s MV A velocity: 107.00 cm/s  SHUNTS MV E/A ratio:  0.75         Systemic VTI:  0.30 m                             Systemic Diam: 2.00 cm  Ida Rogue MD Electronically signed by Ida Rogue MD Signature Date/Time: 08/21/2019/11:31:29 AM    Final     Microbiology: Recent Results (from the past 240 hour(s))  Respiratory Panel by RT PCR (Flu A&B, Covid) - Nasopharyngeal Swab     Status: None   Collection Time: 08/20/19  4:21 PM   Specimen: Nasopharyngeal Swab  Result Value Ref Range Status   SARS Coronavirus 2 by RT PCR NEGATIVE NEGATIVE Final    Comment: (NOTE) SARS-CoV-2 target nucleic acids are NOT DETECTED. The SARS-CoV-2 RNA is generally detectable in upper respiratoy specimens during the acute phase of infection. The lowest concentration of SARS-CoV-2 viral copies this assay can detect is 131 copies/mL. A negative result does not preclude SARS-Cov-2 infection and should not be used as the sole basis for treatment or other patient management decisions. A negative result may occur with  improper specimen collection/handling, submission of specimen other than nasopharyngeal swab, presence of viral mutation(s) within the areas targeted by this assay, and inadequate number of viral copies (<131 copies/mL). A negative result must be combined with clinical observations, patient history, and epidemiological information. The expected  result is Negative. Fact Sheet for Patients:  PinkCheek.be Fact Sheet for Healthcare Providers:  GravelBags.it This test is not yet ap proved or cleared by the [place] FDA and  has been authorized for detection and/or diagnosis of SARS-CoV-2 by FDA under an Emergency Use Authorization (EUA). This EUA will remain  in effect (meaning this test can be used) for the duration of the COVID-19 declaration under Section 564(b)(1) of the Act, 21 U.S.C. section 360bbb-3(b)(1), unless the authorization is terminated or revoked sooner.    Influenza A by PCR NEGATIVE NEGATIVE Final   Influenza B by PCR NEGATIVE NEGATIVE Final     Comment: (NOTE) The Xpert Xpress SARS-CoV-2/FLU/RSV assay is intended as an aid in  the diagnosis of influenza from Nasopharyngeal swab specimens and  should not be used as a sole basis for treatment. Nasal washings and  aspirates are unacceptable for Xpert Xpress SARS-CoV-2/FLU/RSV  testing. Fact Sheet for Patients: PinkCheek.be Fact Sheet for Healthcare Providers: GravelBags.it This test is not yet approved or cleared by the [place] FDA and  has been authorized for detection and/or diagnosis of SARS-CoV-2 by  FDA under an Emergency Use Authorization (EUA). This EUA will remain  in effect (meaning this test can be used) for the duration of the  Covid-19 declaration under Section 564(b)(1) of the Act, 21  U.S.C. section 360bbb-3(b)(1), unless the authorization is  terminated or revoked. Performed at [facility], [address] [place], [place] [address]      Labs: CBC: Recent Labs  Lab 08/20/19 1345 08/21/19 0534 08/22/19 0449  WBC 4.1 4.1 3.9*  NEUTROABS 2.8  --   --   HGB 9.7* 8.4* 8.8*  HCT 29.0* 24.9* 26.3*  MCV 85.0 83.8 84.0  PLT 190 156 155*   Basic Metabolic Panel: Recent Labs  Lab 08/20/19 1345 08/21/19 0534 08/22/19 0449  NA 139 141 141  K 4.7 4.6 4.5  CL 105 110 110  CO2 24 25 24   GLUCOSE 223* 117* 144*  BUN 23 24* 24*  CREATININE 2.26* 2.01* 1.96*  CALCIUM 8.9 8.3* 8.0*  MG  --  2.1  --   PHOS  --  3.7  --    Liver Function Tests: Recent Labs  Lab 08/20/19 1345  AST 25  ALT 15  ALKPHOS 46  BILITOT 0.9  PROT 7.1  ALBUMIN 3.5   No results for input(s): LIPASE, AMYLASE in the last 168 hours. No results for input(s): AMMONIA in the last 168 hours. Cardiac Enzymes: No results for input(s): CKTOTAL, CKMB, CKMBINDEX, TROPONINI in the last 168 hours. BNP (last 3 results) No results for input(s): BNP in the last 8760 hours. CBG: Recent Labs  Lab 08/21/19 1636  08/21/19 2017 08/21/19 2347 08/22/19 0828 08/22/19 1218  GLUCAP 132* 157* 131* 144* 202*    Time spent: 35 minutes  Signed:  Val Riles  [facility]  08/22/2019 12:21 PM

## 2019-08-22 NOTE — Progress Notes (Signed)
Physical Therapy Treatment Patient Details Name: Brianna Dodson MRN: [id number] DOB: [birth year] Today's Date: 08/22/2019    History of Present Illness Brianna Dodson is a 86 y.o. female with Past medical history of CVA with residual left arm weakness, HTN, HLD, NIDDM, CKD stage IV, obesity, presented at [facility] ED complaining of left hand and arm weakness.  As per patient she had a stroke last year in September and October, her speech is little bit slurred and left arm weakness.  Patient stated that yesterday (2/16) her speech got worse and she could not even say post office and today she dropped water from her left hand and pocketbook from her left hand so she came in for further evaluation.  Patient denied any chest pain or palpitations, no headache no dizziness, no shortness of breath. Brain MRI 08/20/2019 showed actue to subacute recurrent R MCA territory infarct, underlying chronic ischemic and small vessel disease.    PT Comments    Pt was supine in bed upon arriving. She is A and O x 4 and cooperative throughout. Reports no pain but requesting to use BR. BP 168/70 HR 65 bpm O2 99%. Pt was able to exit bed with supervision. She stood and ambulated to BR pushing IV pole for balance with CGA for safety. After using BR she ambulated in hallway 150 ft with IV pole for support and gait belt for safety. She tolerated PT session well. Repositioned in recliner post session with call bell in reach, chair alarm set, and LEs elevated. PT will continue to follow per POC and continues to recommend home with HHPT to address balance and overall safety and assist pt with returning to PLOF.    Follow Up Recommendations  Home health PT;Supervision for mobility/OOB     Equipment Recommendations  Rolling walker with 5" wheels    Recommendations for Other Services       Precautions / Restrictions Precautions Precautions: Fall Restrictions Weight Bearing Restrictions: No    Mobility  Bed Mobility Overal bed  mobility: Needs Assistance Bed Mobility: Supine to Sit     Supine to sit: Supervision     General bed mobility comments: HOB slightly elevated. pt exited R side of bed 2/2 to requesting to go to BR. She was able to stand with supervision.  Transfers Overall transfer level: Needs assistance Equipment used: (used IV pole for stablity.) Transfers: Sit to/from Stand Sit to Stand: Min guard         General transfer comment: CGA for safety to stand from bed/recliner/and toilet. she tolerated transfers well without LOB or unsteadiness  Ambulation/Gait Ambulation/Gait assistance: Min guard   Assistive device: IV Pole Gait Pattern/deviations: Step-through pattern;Narrow base of support Gait velocity: decreased   General Gait Details: Pt was able to ambulate to BR then 150 ft with pushing IV pole for balance. NO LOB noted. minimal vcs for improved BOS during gait however pt tolerated well.   Stairs             Wheelchair Mobility    Modified Rankin (Stroke Patients Only)       Balance Overall balance assessment: Needs assistance Sitting-balance support: Feet supported Sitting balance-Leahy Scale: Good Sitting balance - Comments: no LOB seated EOB or on toilet. was able to reach outside BOS without LOB   Standing balance support: Single extremity supported Standing balance-Leahy Scale: Fair Standing balance comment: Pt demonstrates fair balance in standing. does tend to have narrow BOS.  Cognition Arousal/Alertness: Awake/alert Behavior During Therapy: WFL for tasks assessed/performed Overall Cognitive Status: Within Functional Limits for tasks assessed                                 General Comments: Pt was A and O x 4. required increased time to response 2/2 to speech deficits however able to follow commands. she is slightly impulsive at time but overall fair safety awareness      Exercises      General  Comments        Pertinent Vitals/Pain      Home Living                      Prior Function            PT Goals (current goals can now be found in the care plan section) Acute Rehab PT Goals Patient Stated Goal: get better    Frequency    7X/week      PT Plan Current plan remains appropriate    Co-evaluation              AM-PAC PT "6 Clicks" Mobility   Outcome Measure  Help needed turning from your back to your side while in a flat bed without using bedrails?: A Little Help needed moving from lying on your back to sitting on the side of a flat bed without using bedrails?: A Little Help needed moving to and from a bed to a chair (including a wheelchair)?: A Little Help needed standing up from a chair using your arms (e.g., wheelchair or bedside chair)?: A Little Help needed to walk in hospital room?: A Little Help needed climbing 3-5 steps with a railing? : A Little 6 Click Score: 18    End of Session Equipment Utilized During Treatment: Gait belt Activity Tolerance: Patient tolerated treatment well Patient left: in chair;with chair alarm set;with call bell/phone within reach Nurse Communication: Mobility status PT Visit Diagnosis: Unsteadiness on feet (R26.81);Hemiplegia and hemiparesis Hemiplegia - Right/Left: Left Hemiplegia - dominant/non-dominant: Non-dominant Hemiplegia - caused by: Cerebral infarction     Time: 7209-4709 PT Time Calculation (min) (ACUTE ONLY): 24 min  Charges:  $Gait Training: 8-22 mins $Therapeutic Activity: 8-22 mins                     Julaine Fusi PTA 08/22/19, 1:01 PM

## 2019-08-22 NOTE — Progress Notes (Signed)
Occupational Therapy Treatment Patient Details Name: Brianna Dodson MRN: [id number] DOB: 25-Mar-1934 Today's Date: 08/22/2019    History of present illness Brianna Dodson is a 86 y.o. female with Past medical history of CVA with residual left arm weakness, HTN, HLD, NIDDM, CKD stage IV, obesity, presented at [facility] ED complaining of left hand and arm weakness.  As per patient she had a stroke last year in September and October, her speech is little bit slurred and left arm weakness.  Patient stated that yesterday (2/16) her speech got worse and she could not even say post office and today she dropped water from her left hand and pocketbook from her left hand so she came in for further evaluation.  Patient denied any chest pain or palpitations, no headache no dizziness, no shortness of breath. Brain MRI 08/20/2019 showed actue to subacute recurrent R MCA territory infarct, underlying chronic ischemic and small vessel disease.   OT comments  Patient seen this afternoon for occupational therapy session.  She had just finished eating lunch and was sitting at the side of the bed with her daughter beside her in recliner.  Pt agreeable to therapy.  Reinforced HEP targeting [place] and bilateral coordination.  Patient required maximal verbal/visual cues to maintain attention to task, maintain concentration and improve impulse control.  Patient very concerned with going home and easily distracted.  Continues to require assistance to effectively perform HEP.  Completed sit to stand from EOB with CGA using FWW for UE support.  Requires cues for self pacing, safety, posture and impulse control.  Provided education to pt and daughter about adjusting height of 2WW.  Verbalized and demonstrated understanding.  Based on today's performance, patient would benefit from further skilled therapy in CIR.  Family wants to take patient home.  Therapist believes patient would be safe within home environment with 24/7 assistance.     Follow  Up Recommendations  CIR    Equipment Recommendations  3 in 1 bedside commode;Tub/shower seat    Recommendations for Other Services Rehab consult    Precautions / Restrictions Precautions Precautions: Fall Restrictions Weight Bearing Restrictions: No       Mobility Bed Mobility   Transfers Overall transfer level: Needs assistance Equipment used: Rolling walker (2 wheeled) Transfers: Sit to/from Stand Sit to Stand: Min guard         General transfer comment: Requires verbal and tactile cues for sequencing, safety awareness and self pacing.    Balance                            ADL either performed or assessed with clinical judgement   ADL Overall ADL's : Needs assistance/impaired     Grooming: Wash/dry hands;Wash/dry face;Brushing hair;Minimal assistance Grooming Details (indicate cue type and reason): Requires assistance for sequencing and maintaining attention to task                               General ADL Comments: Patient has deficits with impulse control, attention and concentration and requires several visual/tactile/verbal cues to complete tasks safely.     Vision       Perception     Praxis      Cognition Arousal/Alertness: Awake/alert Behavior During Therapy: WFL for tasks assessed/performed Overall Cognitive Status: Within Functional Limits for tasks assessed  General Comments: Requires extra time for processing and requires cueing to maintain attention to task for safety.        Exercises Other Exercises Other Exercises: Pt educated re: falls prevention, transfer technique, and importance of LUE use for improved functional bimanual integration Other Exercises: Educated patient and daughter on adjusting height of 2WW. Other Exercises: TherEx:  completed HEP using theraputty to address [facility].   Shoulder Instructions       General Comments      Pertinent Vitals/  Pain       Pain Assessment: No/denies pain  Home Living                                          Prior Functioning/Environment              Frequency  Min 3X/week        Progress Toward Goals  OT Goals(current goals can now be found in the care plan section)  Progress towards OT goals: Progressing toward goals  Acute Rehab OT Goals Patient Stated Goal: get better  Plan Discharge plan remains appropriate    Co-evaluation                 AM-PAC OT "6 Clicks" Daily Activity     Outcome Measure                    End of Session Equipment Utilized During Treatment: Gait belt  OT Visit Diagnosis: Unsteadiness on feet (R26.81);Muscle weakness (generalized) (M62.81);Hemiplegia and hemiparesis Hemiplegia - Right/Left: Left Hemiplegia - dominant/non-dominant: Non-Dominant Hemiplegia - caused by: Cerebral infarction   Activity Tolerance Patient tolerated treatment well   Patient Left in bed;with call bell/phone within reach;with bed alarm set;with family/visitor present   Nurse Communication          Time: 1335-1400 OT Time Calculation (min): 25 min  Charges: OT General Charges $OT Visit: 1 Visit OT Treatments $Therapeutic Activity: 8-22 mins $Therapeutic Exercise: 8-22 mins  Baldomero Lamy, MS, OTR/L 08/22/19, 2:26 PM

## 2019-08-25 ENCOUNTER — Telehealth: Payer: Self-pay

## 2019-08-25 ENCOUNTER — Telehealth: Payer: Self-pay | Admitting: Family Medicine

## 2019-08-25 NOTE — Telephone Encounter (Signed)
Pt sister called and would like a return call she stated that Dr. Caryl Bis needed to get in contact with Dr. Manuella Ghazi

## 2019-08-25 NOTE — Telephone Encounter (Signed)
Patient was in the hospital this weekend she had a stroke. 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 Manuella Ghazi her neurologist to figure out what to do for her.  The patients records will be sent here but they don't know how long that will take.  Brianna Dodson,cma

## 2019-08-25 NOTE — Telephone Encounter (Signed)
Transition Care Management Follow-up Telephone Call  Date of discharge and from where: 08/22/19 from [facility].  How have you been since you were released from the hospital? "I am doing okay. Left arm still weak with little grip, speech is still slightly slurred. Nothing new happening." Denies chest pain, headache, dizziness, shortness of breath and all other symptoms. Reports blood sugar yesterday 123 and today 101.   Any questions or concerns? Sister [name] is to be consulted for scheduling appointment. [phone]. If no answer, call patient's phone number. Nurse spoke with [name] and PMD appointment is confirmed. Awaiting feedback from provider about scheduling with Neurology.   Items Reviewed: Did the pt receive and understand the discharge instructions provided? Yes,patient was instructed, not to drive, operate heavy machinery, perform activities at heights, swimming or participation in water activities or provide baby sitting services while on Pain, Sleep and Anxiety Medications; until her outpatient Physician has advised to do so again.  - Also recommended to not to take more than prescribed Pain, Sleep and Anxiety Medications.   Medications obtained and verified? Patient states pre-packed. Taking as scheduled.  Any new allergies since your discharge? None.  Dietary orders reviewed? Yes, low sodium heart healthy.  Do you have support at home? Yes, husband and sister [name]).  Functional Questionnaire: (I = Independent and D = Dependent) ADLs: Husband and Sister assist as needed.   Bathing/Dressing- Civil engineer, contracting in place. Husband assist.  Meal Prep- Sister assists  Eating- I  Maintaining continence- I  Transferring/Ambulation- States she ambulates without assistance and paces herself.   Managing Meds- Prepackaged; taking as directed with no issues.   Follow up appointments reviewed:   PCP Hospital f/u appt confirmed?  Scheduled to see Dr. Caryl Bis on 08/27/19 @  11:30.  Home with HA/PT/OT and speech confirmed? Yes.  Neurology appointment confirmed? Schedule within 1-2 weeks. Not yet scheduled.   Are transportation arrangements needed? None at this time.  If their condition worsens, is the pt aware to call PCP or go to the Emergency Dept.? Yes  Was the patient provided with contact information for the PCP's office or ED? Yes  Was to pt encouraged to call back with questions or concerns? Yes

## 2019-08-26 NOTE — Telephone Encounter (Signed)
LVM for the patient's sister to call me back.   Brianna Dodson,cma

## 2019-08-26 NOTE — Telephone Encounter (Signed)
She needs to follow-up with Dr Manuella Ghazi. 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 I can place a referral to them if she is ok with that. She also needs an [place] of her thyroid for a nodule that was seen there. Is she ok with this? Does she have an appointment with Dr Manuella Ghazi yet?

## 2019-08-27 ENCOUNTER — Telehealth: Payer: Self-pay | Admitting: Family Medicine

## 2019-08-27 ENCOUNTER — Encounter: Payer: Self-pay | Admitting: Family Medicine

## 2019-08-27 ENCOUNTER — Other Ambulatory Visit: Payer: Self-pay

## 2019-08-27 ENCOUNTER — Ambulatory Visit (INDEPENDENT_AMBULATORY_CARE_PROVIDER_SITE_OTHER): Payer: Medicare Other | Admitting: Family Medicine

## 2019-08-27 DIAGNOSIS — I6522 Occlusion and stenosis of left carotid artery: Secondary | ICD-10-CM | POA: Diagnosis not present

## 2019-08-27 DIAGNOSIS — I6529 Occlusion and stenosis of unspecified carotid artery: Secondary | ICD-10-CM | POA: Insufficient documentation

## 2019-08-27 DIAGNOSIS — E1122 Type 2 diabetes mellitus with diabetic chronic kidney disease: Secondary | ICD-10-CM

## 2019-08-27 DIAGNOSIS — I639 Cerebral infarction, unspecified: Secondary | ICD-10-CM | POA: Diagnosis not present

## 2019-08-27 DIAGNOSIS — E041 Nontoxic single thyroid nodule: Secondary | ICD-10-CM

## 2019-08-27 DIAGNOSIS — I10 Essential (primary) hypertension: Secondary | ICD-10-CM

## 2019-08-27 DIAGNOSIS — E785 Hyperlipidemia, unspecified: Secondary | ICD-10-CM | POA: Diagnosis not present

## 2019-08-27 DIAGNOSIS — D649 Anemia, unspecified: Secondary | ICD-10-CM

## 2019-08-27 DIAGNOSIS — N183 Chronic kidney disease, stage 3 unspecified: Secondary | ICD-10-CM

## 2019-08-27 MED ORDER — EZETIMIBE 10 MG PO TABS: 10.0000 mg | ORAL_TABLET | Freq: Every day | ORAL | 3 refills | Status: DC

## 2019-08-27 NOTE — Assessment & Plan Note (Signed)
Chronic issue.  Slightly worse in the hospital.  We will recheck.

## 2019-08-27 NOTE — Assessment & Plan Note (Signed)
LDL uncontrolled.  She will continue Crestor.  We will add Zetia.  Plan to recheck at follow-up in 6 weeks.

## 2019-08-27 NOTE — Assessment & Plan Note (Signed)
Recurrent CVA in the same distribution as prior CVA.  Symptoms are stable since discharge.  She will continue aspirin and Plavix.  We will try to get her set up with neurology sooner than April 5.  Maintain good blood pressure control.  Glucose seems adequately controlled.  We will add on Zetia to help control her cholesterol.

## 2019-08-27 NOTE — Telephone Encounter (Signed)
Patient Is being seen today for a telephone visit.  Alvino Lechuga,cma

## 2019-08-27 NOTE — Assessment & Plan Note (Signed)
Plan for dedicated thyroid ultrasound.

## 2019-08-27 NOTE — Telephone Encounter (Signed)
[facility] is going to start PT/OT and speech therapy by the end of the week.  Tregan Read,cma

## 2019-08-27 NOTE — Telephone Encounter (Signed)
Noted  

## 2019-08-27 NOTE — Telephone Encounter (Signed)
[facility] is going to start PT/OT and speech therapy by the end of the week

## 2019-08-27 NOTE — Assessment & Plan Note (Signed)
Although not on the side of her stroke the stenosis is concerning given her recurrent strokes.  We will refer to vascular surgery to determine if there is any interventional management to be completed.

## 2019-08-27 NOTE — Assessment & Plan Note (Signed)
A1c improved.  Glucose well controlled since being home.  She will continue her current regimen.

## 2019-08-27 NOTE — Progress Notes (Signed)
Virtual Visit via telephone Note  This visit type was conducted due to national recommendations for restrictions regarding the COVID-19 pandemic (e.g. social distancing).  This format is felt to be most appropriate for this patient at this time.  All issues noted in this document were discussed and addressed.  No physical exam was performed (except for noted visual exam findings with Video Visits).   I connected with Brianna Dodson today at 11:30 AM EST by telephone and verified that I am speaking with the correct person using two identifiers. Location patient: home Location provider: work  Persons participating in the virtual visit: patient, provider, Augustin Schooling (sister)  I discussed the limitations, risks, security and privacy concerns of performing an evaluation and management service by telephone and the availability of in person appointments. I also discussed with the patient that there may be a patient responsible charge related to this service. The patient expressed understanding and agreed to proceed.  Interactive audio and video telecommunications were attempted between this provider and patient, however failed, due to patient having technical difficulties OR patient did not have access to video capability.  We continued and completed visit with audio only.  Reason for visit: follow-up  HPI: CVA: Patient was hospitalized for a CVA from 08/20/2019-08/22/2019.  She developed slurred speech and left arm weakness.  She was admitted and there were post ischemic changes within the right cerebral hemisphere that appeared progressed on CT head.  She underwent MRI brain that revealed an acute to subacute recurrent right MCA territory infarct.  She had carotid Dopplers revealing left ICA 40 to 59% stenosis in right ICA moderate atherosclerosis but not hemodynamically significant stenosis.  She underwent MRA brain that was positive for large vessel occlusion of the right MCA distal M1.  The  right anterior temporal artery remained patent but otherwise no significant collaterals were detected by MRA.  She also had generally mild intracranial stenosis and related atherosclerosis elsewhere although with severe right ACA A2 stenosis was also noted.  She was continued on aspirin and Plavix as well as her Crestor.  Her LDL was noted to be 112.  Her A1c was improved at 7.5.  She has home health PT, OT, and speech therapy starting later this week.  The patient reports her speech has not improved or worsened.  Her left-sided weakness is about the same. She has an appointment with neurology in April.   Hypertension: BP has been 128/81.  Taking amlodipine and lisinopril.  No chest pain or shortness of breath.  Diabetes: Recent sugars 117 and 127.  Thyroid nodule: This was noted on carotid Dopplers.  She needs dedicated thyroid ultrasound.  Anemia: She has anemia of chronic disease.  Hemoglobin was slightly lower than baseline.   ROS: See pertinent positives and negatives per HPI.  Past Medical History:  Diagnosis Date  . CKD (chronic kidney disease), stage III   . Diabetes mellitus without complication ([place])   . History of blood transfusion   . Hyperlipidemia   . Hypertension   . Stroke [facility])     Past Surgical History:  Procedure Laterality Date  . ABDOMINAL HYSTERECTOMY  1985  . LOOP RECORDER INSERTION N/A 07/30/2019   Procedure: LOOP RECORDER INSERTION;  Surgeon: Isaias Cowman, MD;  Location: [place] [facility];  Service: Cardiovascular;  Laterality: N/A;  . TEE WITHOUT CARDIOVERSION N/A 04/14/2019   Procedure: TRANSESOPHAGEAL ECHOCARDIOGRAM (TEE);  Surgeon: Teodoro Spray, MD;  Location: [facility];  Service: Cardiovascular;  Laterality: N/A;  Family History  Problem Relation Age of Onset  . Stroke Mother   . Diabetes Mother   . Heart disease Father   . Kidney disease Sister   . Diabetes Sister   . Kidney disease Brother   . Heart disease Sister   . Diabetes  Sister   . Diabetes Sister   . Diabetes Sister   . Kidney disease Sister   . Heart disease Sister   . Kidney disease Brother        kidney transplant  . Early death Brother 21       Truck Accident - died  . Heart disease Brother     SOCIAL HX: nonsmoker   Current Outpatient Medications:  .  ACCU-CHEK GUIDE test strip, USE TO CHECK BLOOD SUGARS TWICE DAILY. E11.9, Disp: 100 each, Rfl: 0 .  amLODipine (NORVASC) 10 MG tablet, TAKE 1 TABLET BY MOUTH EVERY DAY (Patient taking differently: Take 10 mg by mouth daily. ), Disp: 90 tablet, Rfl: 1 .  aspirin 81 MG tablet, Take 81 mg by mouth daily at 12 noon. , Disp: , Rfl:  .  blood glucose meter kit and supplies KIT, Dispense based on patient and insurance preference. Check CBGs two times daily. E11.9., Disp: 1 each, Rfl: 0 .  blood glucose meter kit and supplies, Dispense based on patient and insurance preference. Use up to four times daily as directed. E11.9, Disp: 1 each, Rfl: 0 .  clopidogrel (PLAVIX) 75 MG tablet, Take 1 tablet (75 mg total) by mouth daily., Disp: 30 tablet, Rfl: 3 .  Ferrous Sulfate (IRON) 28 MG TABS, Take 28 mg by mouth daily at 12 noon., Disp: , Rfl:  .  glimepiride (AMARYL) 1 MG tablet, TAKE 3 TABLETS (3 MG TOTAL) BY MOUTH DAILY WITH BREAKFAST., Disp: 270 tablet, Rfl: 1 .  Lancets (ONETOUCH ULTRASOFT) lancets, Use as instructed, Disp: 100 each, Rfl: 6 .  lisinopril (ZESTRIL) 10 MG tablet, Take 1 tablet (10 mg total) by mouth daily. (Patient taking differently: Take 10 mg by mouth daily at 12 noon. ), Disp: 90 tablet, Rfl: 1 .  rosuvastatin (CRESTOR) 40 MG tablet, TAKE 1 TABLET BY MOUTH EVERY DAY (Patient taking differently: Take 40 mg by mouth daily. ), Disp: 90 tablet, Rfl: 3 .  Vitamin D, Ergocalciferol, (DRISDOL) 50000 units CAPS capsule, Take 50,000 Units by mouth every 30 (thirty) days., Disp: , Rfl: 1 .  ezetimibe (ZETIA) 10 MG tablet, Take 1 tablet (10 mg total) by mouth daily., Disp: 90 tablet, Rfl:  3  EXAM: This is a telehealth telephone visit and thus no physical exam was completed.  ASSESSMENT AND PLAN:  Discussed the following assessment and plan:  CVA (cerebral vascular accident) ([place]) Recurrent CVA in the same distribution as prior CVA.  Symptoms are stable since discharge.  She will continue aspirin and Plavix.  We will try to get her set up with neurology sooner than April 5.  Maintain good blood pressure control.  Glucose seems adequately controlled.  We will add on Zetia to help control her cholesterol.  Carotid stenosis Although not on the side of her stroke the stenosis is concerning given her recurrent strokes.  We will refer to vascular surgery to determine if there is any interventional management to be completed.  Essential hypertension Seems to be relatively well controlled.  She will continue her current regimen.  DM type 2 (diabetes mellitus, type 2) (HCC) A1c improved.  Glucose well controlled since being home.  She will continue  her current regimen.  Anemia Chronic issue.  Slightly worse in the hospital.  We will recheck.  Thyroid nodule Plan for dedicated thyroid ultrasound.  HLD (hyperlipidemia) LDL uncontrolled.  She will continue Crestor.  We will add Zetia.  Plan to recheck at follow-up in 6 weeks.   Orders Placed This Encounter  Procedures  . US THYROID    Reason for Referral: thyroid nodule on carotid doppler Referral discussed with patient: yes Best contact number of patient for referral team: [phone] for her sister [name] to schedule Has patient been seen by a specialist for this issue before: no If so, who (practice/provider):  Does the patient wish to return:  Patient provider preference for referral:  Patient location preference for referral:    Standing Status:   Future    Standing Expiration Date:   10/24/2020    Order Specific Question:   Reason for Exam (SYMPTOM  OR DIAGNOSIS REQUIRED)    Answer:   thyroid nodule on carotid  doppler    Order Specific Question:   Preferred imaging location?    Answer:   [facility]  . CBC    Standing Status:   Future    Standing Expiration Date:   08/26/2020  . Ambulatory referral to Vascular Surgery    Referral Priority:   Routine    Referral Type:   Surgical    Referral Reason:   Specialty Services Required    Requested Specialty:   Vascular Surgery    Number of Visits Requested:   1  . Ambulatory referral to Neurology    Referral Priority:   Routine    Referral Type:   Consultation    Referral Reason:   Specialty Services Required    Requested Specialty:   Neurology    Number of Visits Requested:   1    Meds ordered this encounter  Medications  . ezetimibe (ZETIA) 10 MG tablet    Sig: Take 1 tablet (10 mg total) by mouth daily.    Dispense:  90 tablet    Refill:  3     I discussed the assessment and treatment plan with the patient. The patient was provided an opportunity to ask questions and all were answered. The patient agreed with the plan and demonstrated an understanding of the instructions.   The patient was advised to call back or seek an in-person evaluation if the symptoms worsen or if the condition fails to improve as anticipated.  I provided 13 minutes of non-face-to-face time during this encounter.   [name], MD

## 2019-08-27 NOTE — Assessment & Plan Note (Signed)
Seems to be relatively well controlled.  She will continue her current regimen.

## 2019-08-28 DIAGNOSIS — E785 Hyperlipidemia, unspecified: Secondary | ICD-10-CM | POA: Diagnosis not present

## 2019-08-28 DIAGNOSIS — Z7902 Long term (current) use of antithrombotics/antiplatelets: Secondary | ICD-10-CM | POA: Diagnosis not present

## 2019-08-28 DIAGNOSIS — Z9181 History of falling: Secondary | ICD-10-CM | POA: Diagnosis not present

## 2019-08-28 DIAGNOSIS — I2510 Atherosclerotic heart disease of native coronary artery without angina pectoris: Secondary | ICD-10-CM | POA: Diagnosis not present

## 2019-08-28 DIAGNOSIS — I69354 Hemiplegia and hemiparesis following cerebral infarction affecting left non-dominant side: Secondary | ICD-10-CM | POA: Diagnosis not present

## 2019-08-28 DIAGNOSIS — D631 Anemia in chronic kidney disease: Secondary | ICD-10-CM | POA: Diagnosis not present

## 2019-08-28 DIAGNOSIS — N184 Chronic kidney disease, stage 4 (severe): Secondary | ICD-10-CM | POA: Diagnosis not present

## 2019-08-28 DIAGNOSIS — I69328 Other speech and language deficits following cerebral infarction: Secondary | ICD-10-CM | POA: Diagnosis not present

## 2019-08-28 DIAGNOSIS — Z7982 Long term (current) use of aspirin: Secondary | ICD-10-CM | POA: Diagnosis not present

## 2019-08-28 DIAGNOSIS — E1122 Type 2 diabetes mellitus with diabetic chronic kidney disease: Secondary | ICD-10-CM | POA: Diagnosis not present

## 2019-08-28 DIAGNOSIS — I10 Essential (primary) hypertension: Secondary | ICD-10-CM | POA: Diagnosis not present

## 2019-08-29 ENCOUNTER — Telehealth: Payer: Self-pay | Admitting: Family Medicine

## 2019-08-29 DIAGNOSIS — I10 Essential (primary) hypertension: Secondary | ICD-10-CM | POA: Diagnosis not present

## 2019-08-29 DIAGNOSIS — E1122 Type 2 diabetes mellitus with diabetic chronic kidney disease: Secondary | ICD-10-CM | POA: Diagnosis not present

## 2019-08-29 DIAGNOSIS — N184 Chronic kidney disease, stage 4 (severe): Secondary | ICD-10-CM | POA: Diagnosis not present

## 2019-08-29 DIAGNOSIS — D631 Anemia in chronic kidney disease: Secondary | ICD-10-CM | POA: Diagnosis not present

## 2019-08-29 DIAGNOSIS — I69354 Hemiplegia and hemiparesis following cerebral infarction affecting left non-dominant side: Secondary | ICD-10-CM | POA: Diagnosis not present

## 2019-08-29 DIAGNOSIS — I69328 Other speech and language deficits following cerebral infarction: Secondary | ICD-10-CM | POA: Diagnosis not present

## 2019-08-29 NOTE — Telephone Encounter (Signed)
I gave verbal orders to [facility] for [facility] patient:    Icel Castles,cma                        Claudie Leach from [facility]   [phone]   Verbal Orders    Speech Therapy to address dysarphia and aphasia   1x for 1 week 2x for 4 weeks.

## 2019-08-29 NOTE — Telephone Encounter (Signed)
Brianna Dodson from [facility]  [phone]  Verbal Orders   Speech Therapy to address dysarphia and aphasia  1x for 1 week 2x for 4 weeks.

## 2019-08-30 ENCOUNTER — Other Ambulatory Visit: Payer: Self-pay | Admitting: Family Medicine

## 2019-08-31 NOTE — Telephone Encounter (Signed)
Agree with verbal orders  

## 2019-09-01 DIAGNOSIS — I63431 Cerebral infarction due to embolism of right posterior cerebral artery: Secondary | ICD-10-CM | POA: Diagnosis not present

## 2019-09-02 DIAGNOSIS — I10 Essential (primary) hypertension: Secondary | ICD-10-CM | POA: Diagnosis not present

## 2019-09-02 DIAGNOSIS — N184 Chronic kidney disease, stage 4 (severe): Secondary | ICD-10-CM | POA: Diagnosis not present

## 2019-09-02 DIAGNOSIS — E1122 Type 2 diabetes mellitus with diabetic chronic kidney disease: Secondary | ICD-10-CM | POA: Diagnosis not present

## 2019-09-02 DIAGNOSIS — D631 Anemia in chronic kidney disease: Secondary | ICD-10-CM | POA: Diagnosis not present

## 2019-09-02 DIAGNOSIS — I69328 Other speech and language deficits following cerebral infarction: Secondary | ICD-10-CM | POA: Diagnosis not present

## 2019-09-02 DIAGNOSIS — I69354 Hemiplegia and hemiparesis following cerebral infarction affecting left non-dominant side: Secondary | ICD-10-CM | POA: Diagnosis not present

## 2019-09-03 ENCOUNTER — Other Ambulatory Visit: Payer: Self-pay

## 2019-09-03 ENCOUNTER — Telehealth: Payer: Self-pay | Admitting: Family Medicine

## 2019-09-03 ENCOUNTER — Other Ambulatory Visit (INDEPENDENT_AMBULATORY_CARE_PROVIDER_SITE_OTHER): Payer: Medicare Other

## 2019-09-03 DIAGNOSIS — D649 Anemia, unspecified: Secondary | ICD-10-CM

## 2019-09-03 DIAGNOSIS — E041 Nontoxic single thyroid nodule: Secondary | ICD-10-CM

## 2019-09-03 LAB — CBC
HCT: 29.4 % — ABNORMAL LOW (ref 36.0–46.0)
Hemoglobin: 10.3 g/dL — ABNORMAL LOW (ref 12.0–15.0)
MCHC: 35 g/dL (ref 30.0–36.0)
MCV: 83.1 fl (ref 78.0–100.0)
Platelets: 225 10*3/uL (ref 150.0–400.0)
RBC: 3.53 Mil/uL — ABNORMAL LOW (ref 3.87–5.11)
RDW: 14 % (ref 11.5–15.5)
WBC: 4.8 10*3/uL (ref 4.0–10.5)

## 2019-09-03 NOTE — Telephone Encounter (Signed)
Patient's sister called, Ms. Benett called and wanted Dr. Caryl Bis to know that patient has started sleeping a lot and only uses the bathroom every 4 to 5 hours.

## 2019-09-03 NOTE — Telephone Encounter (Signed)
Patient's sister called, Ms. Benett called and wanted Dr. Caryl Bis to know that patient has started sleeping a lot and only uses the bathroom every 4 to 5 hours. please advise.  Link Burgeson,cma

## 2019-09-04 ENCOUNTER — Encounter: Payer: Self-pay | Admitting: Family Medicine

## 2019-09-04 ENCOUNTER — Ambulatory Visit
Admission: RE | Admit: 2019-09-04 | Discharge: 2019-09-04 | Disposition: A | Discharge status: Home / Self Care | Payer: Medicare Other | Source: Ambulatory Visit | Attending: Family Medicine | Admitting: Family Medicine

## 2019-09-04 DIAGNOSIS — N184 Chronic kidney disease, stage 4 (severe): Secondary | ICD-10-CM | POA: Diagnosis not present

## 2019-09-04 DIAGNOSIS — I69354 Hemiplegia and hemiparesis following cerebral infarction affecting left non-dominant side: Secondary | ICD-10-CM | POA: Diagnosis not present

## 2019-09-04 DIAGNOSIS — E1122 Type 2 diabetes mellitus with diabetic chronic kidney disease: Secondary | ICD-10-CM | POA: Diagnosis not present

## 2019-09-04 DIAGNOSIS — E042 Nontoxic multinodular goiter: Secondary | ICD-10-CM | POA: Diagnosis not present

## 2019-09-04 DIAGNOSIS — D631 Anemia in chronic kidney disease: Secondary | ICD-10-CM | POA: Diagnosis not present

## 2019-09-04 DIAGNOSIS — I10 Essential (primary) hypertension: Secondary | ICD-10-CM | POA: Diagnosis not present

## 2019-09-04 DIAGNOSIS — I69328 Other speech and language deficits following cerebral infarction: Secondary | ICD-10-CM | POA: Diagnosis not present

## 2019-09-04 DIAGNOSIS — E041 Nontoxic single thyroid nodule: Secondary | ICD-10-CM | POA: Insufficient documentation

## 2019-09-04 NOTE — Telephone Encounter (Signed)
It might be a good idea to complete a visit for this to gather more details on the sleep and urination.

## 2019-09-04 NOTE — Telephone Encounter (Signed)
I called and spoke with the patient and she stated she sleeps well its just that she gets up 4-5 times to go to the bathroom and the patient wants you to look at her U/S of her neck and give those results to her sister.  Brianna Dodson,cma

## 2019-09-05 ENCOUNTER — Ambulatory Visit: Payer: Commercial Indemnity | Admitting: Family Medicine

## 2019-09-05 ENCOUNTER — Telehealth: Payer: Self-pay

## 2019-09-05 DIAGNOSIS — I69354 Hemiplegia and hemiparesis following cerebral infarction affecting left non-dominant side: Secondary | ICD-10-CM | POA: Diagnosis not present

## 2019-09-05 DIAGNOSIS — D631 Anemia in chronic kidney disease: Secondary | ICD-10-CM | POA: Diagnosis not present

## 2019-09-05 DIAGNOSIS — E1122 Type 2 diabetes mellitus with diabetic chronic kidney disease: Secondary | ICD-10-CM | POA: Diagnosis not present

## 2019-09-05 DIAGNOSIS — I10 Essential (primary) hypertension: Secondary | ICD-10-CM | POA: Diagnosis not present

## 2019-09-05 DIAGNOSIS — N184 Chronic kidney disease, stage 4 (severe): Secondary | ICD-10-CM | POA: Diagnosis not present

## 2019-09-05 DIAGNOSIS — I69328 Other speech and language deficits following cerebral infarction: Secondary | ICD-10-CM | POA: Diagnosis not present

## 2019-09-05 NOTE — Telephone Encounter (Signed)
The ultrasound revealed multiple nodules.  There was one nodule that was recommended to be followed up on in 1 year with another ultrasound due to size.  There was no recommendation for biopsy.  We can get another ultrasound in 1 year to follow-up on this.  She should monitor her sleep and let us know if she is sleeping excessively.  We could complete a visit to discuss her urination if she would like.

## 2019-09-07 NOTE — Telephone Encounter (Signed)
Please see if she is consistently taking her lisinopril and amlodipine. If she is then we may need to add an additional medication.

## 2019-09-08 NOTE — Telephone Encounter (Signed)
I called and spoke with Claudie Leach of [facility] and she stated she is seeing the patient tomorrow and she will find out if she has been consistently taking hie lisinopril and amlodipine and will return a call to [place] to let us know.  Gaila Engebretsen,cma

## 2019-09-09 ENCOUNTER — Encounter (INDEPENDENT_AMBULATORY_CARE_PROVIDER_SITE_OTHER): Payer: Self-pay | Admitting: Vascular Surgery

## 2019-09-09 ENCOUNTER — Ambulatory Visit (INDEPENDENT_AMBULATORY_CARE_PROVIDER_SITE_OTHER): Payer: Medicare Other | Admitting: Vascular Surgery

## 2019-09-09 ENCOUNTER — Other Ambulatory Visit: Payer: Self-pay

## 2019-09-09 VITALS — BP 119/61 | HR 81 | Resp 16 | Wt 176.0 lb

## 2019-09-09 DIAGNOSIS — I69354 Hemiplegia and hemiparesis following cerebral infarction affecting left non-dominant side: Secondary | ICD-10-CM | POA: Diagnosis not present

## 2019-09-09 DIAGNOSIS — N184 Chronic kidney disease, stage 4 (severe): Secondary | ICD-10-CM

## 2019-09-09 DIAGNOSIS — I6522 Occlusion and stenosis of left carotid artery: Secondary | ICD-10-CM | POA: Diagnosis not present

## 2019-09-09 DIAGNOSIS — D631 Anemia in chronic kidney disease: Secondary | ICD-10-CM | POA: Diagnosis not present

## 2019-09-09 DIAGNOSIS — I10 Essential (primary) hypertension: Secondary | ICD-10-CM

## 2019-09-09 DIAGNOSIS — N183 Chronic kidney disease, stage 3 unspecified: Secondary | ICD-10-CM

## 2019-09-09 DIAGNOSIS — I639 Cerebral infarction, unspecified: Secondary | ICD-10-CM | POA: Diagnosis not present

## 2019-09-09 DIAGNOSIS — I69328 Other speech and language deficits following cerebral infarction: Secondary | ICD-10-CM | POA: Diagnosis not present

## 2019-09-09 DIAGNOSIS — E785 Hyperlipidemia, unspecified: Secondary | ICD-10-CM

## 2019-09-09 DIAGNOSIS — E1122 Type 2 diabetes mellitus with diabetic chronic kidney disease: Secondary | ICD-10-CM | POA: Diagnosis not present

## 2019-09-09 NOTE — Patient Instructions (Signed)
Carotid Artery Disease  Carotid artery disease is the narrowing or blockage of one or both carotid arteries. This condition is also called carotid artery stenosis. The carotid arteries are the two main blood vessels on either side of the neck. They send blood to the brain, other parts of the head, and the neck.  This condition increases your risk for a stroke or a transient ischemic attack (TIA). A TIA is a "mini-stroke" that causes stroke-like symptoms that go away quickly. What are the causes? This condition is mainly caused by a narrowing and hardening of the carotid arteries. The carotid arteries can become narrow or clogged with a buildup of plaque. Plaque includes:  Fat.  Cholesterol.  Calcium.  Other substances. What increases the risk? The following factors may make you more likely to develop this condition:  Having certain medical conditions, such as: ? High cholesterol. ? High blood pressure. ? Diabetes. ? Obesity.  Smoking.  A family history of cardiovascular disease.  Not being active or lack of regular exercise.  Being female. Men have a higher risk of having arteries become narrow and harden earlier in life than women.  Old age. What are the signs or symptoms? This condition may not have any signs or symptoms until a stroke or TIA happens. In some cases, your doctor may be able to hear a whooshing sound. This can suggest a change in blood flow caused by plaque buildup. An eye exam can also help find signs of the condition. How is this treated? This condition may be treated with more than one treatment. Treatment options include:  Lifestyle changes, such as: ? Quitting smoking. ? Getting regular exercise, or getting exercise as told by your doctor. ? Eating a healthy diet. ? Managing stress. ? Keeping a healthy weight.  Medicines to control: ? Blood pressure. ? Cholesterol. ? Blood clotting.  Surgery. You may have: ? A surgery to remove the blockages in  the carotid arteries. ? A procedure in which a small mesh tube (stent) is used to widen the blocked carotid arteries. Follow these instructions at home: Eating and drinking Follow instructions about your diet from your doctor. It is important to follow a healthy diet.  Eat a diet that includes: ? A lot of fresh fruits and vegetables. ? Low-fat (lean) meats.  Avoid these foods: ? Foods that are high in fat. ? Foods that are high in salt (sodium). ? Foods that are fried. ? Foods that are processed. ? Foods that have few good nutrients (poor nutritional value).  Lifestyle   Keep a healthy weight.  Do exercises as told by your doctor to stay active. Each week, you should get one of the following: ? At least 150 minutes of exercise that raises your heart rate and makes you sweat (moderate-intensity exercise). ? At least 75 minutes of exercise that takes a lot of effort.  Do not use any products that contain nicotine or tobacco, such as cigarettes, e-cigarettes, and chewing tobacco. If you need help quitting, ask your doctor.  Do not drink alcohol if: ? Your doctor tells you not to drink. ? You are pregnant, may be pregnant, or are planning to become pregnant.  If you drink alcohol: ? Limit how much you use to:  0-1 drink a day for women.  0-2 drinks a day for men. ? Be aware of how much alcohol is in your drink. In the [place] one drink equals one 12 oz bottle of beer (355 mL), one 5   oz glass of wine (148 mL), or one 1 oz glass of hard liquor (44 mL).  Do not use drugs.  Manage your stress. Ask your doctor for tips on how to do this. General instructions  Take over-the-counter and prescription medicines only as told by your doctor.  Keep all follow-up visits as told by your doctor. This is important. Where to find more information  American Heart Association: www.heart.org Get help right away if:  You have any signs of a stroke. "BE FAST" is an easy way to remember the  main warning signs: ? B - Balance. Signs are dizziness, sudden trouble walking, or loss of balance. ? E - Eyes. Signs are trouble seeing or a change in how you see. ? F - Face. Signs are sudden weakness or loss of feeling of the face, or the face or eyelid drooping on one side. ? A - Arms. Signs are weakness or loss of feeling in an arm. This happens suddenly and usually on one side of the body. ? S - Speech. Signs are sudden trouble speaking, slurred speech, or trouble understanding what people say. ? T - Time. Time to call emergency services. Write down what time symptoms started.  You have other signs of a stroke, such as: ? A sudden, very bad headache with no known cause. ? Feeling like you may vomit (nausea). ? Vomiting. ? A seizure. These symptoms may be an emergency. Do not wait to see if the symptoms will go away. Get medical help right away. Call your local emergency services (911 in the [place] Do not drive yourself to the hospital. Summary  The carotid arteries are blood vessels on both sides of the neck.  If these arteries get smaller or get blocked, you are more likely to have a stroke or a mini-stroke.  This condition can be treated with lifestyle changes, medicines, surgery, or a blend of these treatments.  Get help right away if you have any signs of a stroke. "BE FAST" is an easy way to remember the main warning signs of stroke. This information is not intended to replace advice given to you by your health care provider. Make sure you discuss any questions you have with your health care provider. Document Revised: 01/13/2019 Document Reviewed: 01/13/2019 Elsevier Patient Education  2020 [employer]  

## 2019-09-09 NOTE — Assessment & Plan Note (Signed)
At her level of carotid disease, this was possibly the source but not likely.  More likely was due to hypertension or thromboembolic phenomenon.

## 2019-09-09 NOTE — Assessment & Plan Note (Signed)
blood glucose control important in reducing the progression of atherosclerotic disease. Also, involved in wound healing. On appropriate medications.  

## 2019-09-09 NOTE — Progress Notes (Signed)
[name], female   DOB: May 05, 1934, 87 y.o.   MRN: [id number]  Chief Complaint  Patient presents with  . New Patient (Initial Visit)    ref [place] carotid stenosis    HPI Brianna Dodson is a 86 y.o. female.  I am asked to see the patient by Dr. Caryl Bis for evaluation of carotid stenosis.  The patient reports a stroke that has left her with some speech difficulty and difficulty finding words.  She does not have appreciable arm or leg weakness or numbness that is residual at this point.  No current swallowing difficulty.  Multiple medical issues as listed below and after her stroke, carotid duplex performed. Recent carotid duplex suggested mild right carotid artery stenosis and stenosis that may fall just into the 50 to 69% range on the left.  With this finding, she is referred for further evaluation and treatment   Past Medical History:  Diagnosis Date  . CKD (chronic kidney disease), stage III   . Diabetes mellitus without complication ([place])   . History of blood transfusion   . Hyperlipidemia   . Hypertension   . Stroke [name])     Past Surgical History:  Procedure Laterality Date  . ABDOMINAL HYSTERECTOMY  1985  . LOOP RECORDER INSERTION N/A 07/30/2019   Procedure: LOOP RECORDER INSERTION;  Surgeon: Isaias Cowman, MD;  Location: [facility];  Service: Cardiovascular;  Laterality: N/A;  . TEE WITHOUT CARDIOVERSION N/A 04/14/2019   Procedure: TRANSESOPHAGEAL ECHOCARDIOGRAM (TEE);  Surgeon: Teodoro Spray, MD;  Location: [facility];  Service: Cardiovascular;  Laterality: N/A;    Family History  Problem Relation Age of Onset  . Stroke Mother   . Diabetes Mother   . Heart disease Father   . Kidney disease Sister   . Diabetes Sister   . Kidney disease Brother   . Heart disease Sister   . Diabetes Sister   . Diabetes Sister   . Diabetes Sister   . Kidney disease Sister   . Heart disease Sister   . Kidney disease Brother        kidney  transplant  . Early death Brother 54       Truck Accident - died  . Heart disease Brother     Social History   Tobacco Use  . Smoking status: Never Smoker  . Smokeless tobacco: Never Used  Substance Use Topics  . Alcohol use: No  . Drug use: No     Allergies  Allergen Reactions  . Nsaids     CKD stage III - Avoid all nephrotoxic drugs    Current Outpatient Medications  Medication Sig Dispense Refill  . ACCU-CHEK GUIDE test strip USE TO CHECK BLOOD SUGARS TWICE DAILY. E11.9 100 each 0  . amLODipine (NORVASC) 10 MG tablet TAKE 1 TABLET BY MOUTH EVERY DAY (Patient taking differently: Take 10 mg by mouth daily. ) 90 tablet 1  . aspirin 81 MG tablet Take 81 mg by mouth daily at 12 noon.     . blood glucose meter kit and supplies KIT Dispense based on patient and insurance preference. Check CBGs two times daily. E11.9. 1 each 0  . blood glucose meter kit and supplies Dispense based on patient and insurance preference. Use up to four times daily as directed. E11.9 1 each 0  . clopidogrel (PLAVIX) 75 MG tablet Take 1 tablet (75 mg total) by mouth daily. 30 tablet 3  . ezetimibe (ZETIA) 10 MG tablet Take 1  tablet (10 mg total) by mouth daily. 90 tablet 3  . Ferrous Sulfate (IRON) 28 MG TABS Take 28 mg by mouth daily at 12 noon.    Marland Kitchen glimepiride (AMARYL) 1 MG tablet TAKE 3 TABLETS (3 MG TOTAL) BY MOUTH DAILY WITH BREAKFAST. 270 tablet 1  . Lancets (ONETOUCH ULTRASOFT) lancets Use as instructed 100 each 6  . lisinopril (ZESTRIL) 10 MG tablet Take 1 tablet (10 mg total) by mouth daily. (Patient taking differently: Take 10 mg by mouth daily at 12 noon. ) 90 tablet 1  . rosuvastatin (CRESTOR) 40 MG tablet TAKE ONE TABLET BY MOUTH DAILY 90 tablet 0  . Vitamin D, Ergocalciferol, (DRISDOL) 50000 units CAPS capsule Take 50,000 Units by mouth every 30 (thirty) days.  1   No current facility-administered medications for this visit.      REVIEW OF SYSTEMS (Negative unless  checked)  Constitutional: [] Weight loss  [] Fever  [] Chills Cardiac: [] Chest pain   [] Chest pressure   [] Palpitations   [] Shortness of breath when laying flat   [] Shortness of breath at rest   [] Shortness of breath with exertion. Vascular:  [] Pain in legs with walking   [] Pain in legs at rest   [] Pain in legs when laying flat   [] Claudication   [] Pain in feet when walking  [] Pain in feet at rest  [] Pain in feet when laying flat   [] History of DVT   [] Phlebitis   [] Swelling in legs   [] Varicose veins   [] Non-healing ulcers Pulmonary:   [] Uses home oxygen   [] Productive cough   [] Hemoptysis   [] Wheeze  [] COPD   [] Asthma Neurologic:  [] Dizziness  [] Blackouts   [] Seizures   [x] History of stroke   [x] History of TIA  [] Aphasia   [] Temporary blindness   [] Dysphagia   [] Weakness or numbness in arms   [] Weakness or numbness in legs Musculoskeletal:  [x] Arthritis   [] Joint swelling   [x] Joint pain   [] Low back pain Hematologic:  [] Easy bruising  [] Easy bleeding   [] Hypercoagulable state   [] Anemic  [] Hepatitis Gastrointestinal:  [] Blood in stool   [] Vomiting blood  [] Gastroesophageal reflux/heartburn   [] Abdominal pain Genitourinary:  [x] Chronic kidney disease   [] Difficult urination  [] Frequent urination  [] Burning with urination   [] Hematuria Skin:  [] Rashes   [] Ulcers   [] Wounds Psychological:  [] History of anxiety   []  History of major depression.    Physical Exam BP 119/61 (BP Location: Left Arm)   Pulse 81   Resp 16   Wt 176 lb (79.8 kg)   BMI 29.29 kg/m  Gen:  WD/WN, NAD.  Appears younger than stated age Head: [place]/AT, No temporalis wasting. Ear/Nose/Throat: Hearing grossly intact, nares w/o erythema or drainage, oropharynx w/o Erythema/Exudate Eyes: Conjunctiva clear, sclera non-icteric  Neck: trachea midline.  No bruit or JVD.  Pulmonary:  Good air movement, clear to auscultation bilaterally.  Cardiac: RRR, no JVD Vascular:  Vessel Right Left  Radial Palpable Palpable                                    Gastrointestinal: soft, non-tender/non-distended.  Musculoskeletal: M/S 5/5 throughout.  Extremities without ischemic changes.  No deformity or atrophy.  No edema. Neurologic: Sensation grossly intact in extremities.  Symmetrical.  Speech is labored. Motor exam as listed above. Psychiatric: Judgment intact, Mood & affect appropriate for pt's clinical situation. Dermatologic: No rashes or ulcers noted.  No cellulitis or open  wounds.   Radiology CT HEAD WO CONTRAST  Result Date: 08/20/2019 CLINICAL DATA:  Possible stroke, focal neuro deficit, greater than 6 hours, stroke suspected. Additional history provided: Left arm weakness, previous stroke in October and September 2020, residual slurred speech EXAM: CT HEAD WITHOUT CONTRAST TECHNIQUE: Contiguous axial images were obtained from the base of the skull through the vertex without intravenous contrast. COMPARISON:  Brain MRI 04/12/2019 FINDINGS: Brain: There is no evidence of acute intracranial hemorrhage. Again demonstrated is patchy cortical and subcortical hypodensity within the right frontoparietal lobes and portions of the posterior right temporal lobe. Some of this abnormal hypodensity corresponds with the infarcts which were acute at time of prior MRI 04/12/2019. However, some of these ischemic changes appear progressed as compared to this prior exam. For instance, within the cortical and subcortical right frontoparietal lobe (series 2, images 17-21) and within the mid to anterior right frontal lobe white matter. Redemonstrated small chronic cortical infarct within the inferior right parietal lobe (series 2, image 19). Background advanced chronic small vessel ischemic disease. Moderate generalized parenchymal atrophy. There is no evidence of intracranial mass. No midline shift or extra-axial fluid collection Vascular: No hyperdense vessel.  Atherosclerotic calcifications. Skull: No acute bony abnormality. Sinuses/Orbits:  Visualized orbits demonstrate no acute abnormality. Frothy secretions within the right sphenoid sinus and posterior right ethmoid air cells. No significant mastoid effusion at the imaged levels. IMPRESSION: 1. Post ischemic changes within the right cerebral hemisphere appear progressed as compared to prior MRI 04/12/2019, most notably within the cortical/subcortical right frontal parietal lobes and mid to anterior right frontal lobe white matter. Some of these changes may be acute and MRI is recommended for further evaluation. 2. Redemonstrated background advanced chronic small vessel ischemic disease with moderate generalized parenchymal atrophy. 3. Frothy secretions within the right sphenoid sinus and posterior right ethmoid air cells. Correlate for acute sinusitis. Electronically Signed   By: Kellie Simmering DO   On: 08/20/2019 14:29   MR ANGIO HEAD WO CONTRAST  Addendum Date: 08/21/2019   ADDENDUM REPORT: 08/21/2019 17:00 ADDENDUM: Study discussed by telephone with Dr. Alexis Goodell on 08/21/2019 at 1653 hours. Electronically Signed   By: Genevie Ann M.D.   On: 08/21/2019 17:00   Result Date: 08/21/2019 CLINICAL DATA:  86 year old female with recurrent right MCA territory infarct affecting the operculum. EXAM: MRA HEAD WITHOUT CONTRAST TECHNIQUE: Angiographic images of the Circle of Willis were obtained using MRA technique without intravenous contrast. COMPARISON:  Brain MRI 08/20/2019.  Neck MRA 04/12/2019. FINDINGS: Study is intermittently degraded by motion artifact despite repeated imaging attempts. Antegrade flow in the posterior circulation. Dominant appearing distal left vertebral artery. The right vertebral may functionally terminates in PICA. No definite distal vertebral stenosis. Tortuous basilar artery is patent with irregularity but only mild mid basilar stenosis. There are fetal type bilateral PCA origins. Proximal PCAs are patent, with up to mild irregularity. Grossly symmetric distal PCA flow  signal. Antegrade flow in both ICA siphons. Evidence of calcified siphon atherosclerosis. But only mild siphon stenosis bilaterally. On the right side there is a prominent 3 mm outpouching which appears to be an infundibulum of the right ophthalmic artery origin (series 5, image 95). Both posterior communicating artery origins are within normal limits. Patent carotid termini. Patent MCA and ACA origins. The left A1 appears dominant and the right diminutive or absent. Anterior communicating artery is within normal limits. There is a severe stenosis of the right ACA A2 visible on series 1056, image 6. There is preserved  distal flow. The left MCA M1 segment and bifurcation are patent. Grossly normal left MCA branches. The right MCA M1 is patent but flow signal abruptly terminates at the level of the right MCA bifurcation (series 1056, image 9). There is patency of the right anterior temporal artery but no other convincing M2 branches. IMPRESSION: 1. Positive for large vessel occlusion of the Right MCA distal M1. The right anterior temporal artery remains patent but otherwise no significant collaterals are detected by MRA. 2. Generally mild intracranial stenosis related to atherosclerosis elsewhere, although a severe Right ACA A2 stenosis is also noted. 3. Infundibulum of the right ophthalmic artery origin suspected (normal variant). Electronically Signed: By: Genevie Ann M.D. On: 08/21/2019 16:48   MR Brain Wo Contrast (neuro protocol)  Result Date: 08/20/2019 CLINICAL DATA:  86 year old female with stroke-like symptoms. Ischemic changes progressed in the right hemisphere by CT since October. EXAM: MRI HEAD WITHOUT CONTRAST TECHNIQUE: Multiplanar, multiecho pulse sequences of the brain and surrounding structures were obtained without intravenous contrast. COMPARISON:  Head CT earlier today. Brain MRI 04/12/2019. FINDINGS: Brain: Confluent restricted diffusion in the operculum and right lateral perirolandic cortex.  Scattered involvement of the right insula. See series 5, image 28 and series 8, image 15. Associated T2 and FLAIR hyperintensity. Minimal T1 hypointensity. No evidence of associated hemorrhage. No mass effect. No left hemisphere or posterior fossa restricted diffusion. Small chronic infarct in the right cerebellum. Patchy and confluent bilateral cerebral white matter T2 and FLAIR hyperintensity. A small area of right lateral occiput cortical encephalomalacia is new since October on series 7, image 32. There are chronic microhemorrhages in the bilateral thalamus. No midline shift, mass effect, evidence of mass lesion, ventriculomegaly, extra-axial collection or acute intracranial hemorrhage. Cervicomedullary junction and pituitary are within normal limits. Vascular: Major intracranial vascular flow voids are stable. Skull and upper cervical spine: Negative visible cervical spine. Normal bone marrow signal. Sinuses/Orbits: Stable and negative orbits. Mild bubbly opacity in the right posterior ethmoid and sphenoid air cells are stable. Other: Mastoids remain clear. Visible internal auditory structures appear normal. Scalp and face soft tissues appear negative. IMPRESSION: 1. Acute to subacute recurrent right MCA territory infarct, confluent in the operculum. No associated hemorrhage or mass effect. 2. Elsewhere stable underlying chronic ischemic and small vessel disease since October. Electronically Signed   By: Genevie Ann M.D.   On: 08/20/2019 17:54   US Carotid Bilateral (at [facility] and [facility] only)  Result Date: 08/21/2019 CLINICAL DATA:  CVA. Visual disturbance. History of hypertension, hyperlipidemia and diabetes. EXAM: BILATERAL CAROTID DUPLEX ULTRASOUND TECHNIQUE: Pearline Cables scale imaging, color Doppler and duplex ultrasound were performed of bilateral carotid and vertebral arteries in the neck. COMPARISON:  03/16/2019 FINDINGS: Criteria: Quantification of carotid stenosis is based on velocity parameters that correlate the  residual internal carotid diameter with NASCET-based stenosis levels, using the diameter of the distal internal carotid lumen as the denominator for stenosis measurement. The following velocity measurements were obtained: RIGHT ICA: 86/20 cm/sec CCA: 56/2 cm/sec SYSTOLIC ICA/CCA RATIO:  1.3 ECA: 75 cm/sec LEFT ICA: 128/33 cm/sec CCA: 56/3 cm/sec SYSTOLIC ICA/CCA RATIO:  1.8 ECA: 139 cm/sec RIGHT CAROTID ARTERY: There is a moderate amount of eccentric echogenic plaque within the right carotid bulb (images 14 and 16), extending to involve the origin and proximal aspects of the right internal carotid artery (image 24), morphologically similar to the 03/2019 examination and again not resulting in elevated peak systolic velocities within the interrogated course of the right internal carotid artery to  suggest a hemodynamically significant stenosis. RIGHT VERTEBRAL ARTERY:  Antegrade flow LEFT CAROTID ARTERY: There is a moderate to large amount of eccentric echogenic partially shadowing plaque within the left carotid bulb (image 49), extending to involve the origin and proximal aspects of the left internal carotid artery (image 57), morphologically similar to the 03/2019 examination though now results in borderline elevated peak systolic velocities within mid aspect the left internal carotid artery. Greatest acquired peak systolic velocity of the mid left ICA measures 128 centimeters/second (image 62), previously, 94 centimeters/second. LEFT VERTEBRAL ARTERY:  Antegrade flow Note is made of an approximately 1.4 x 1.3 x 1.3 cm hypoechoic nodule within the left lobe of the thyroid which appears to contain several internal macrocalcifications. IMPRESSION: 1. Moderate to large amount of left-sided atherosclerotic plaque, morphologically similar to the 03/2019 examination, though now results in elevated peak systolic velocities within the left internal carotid artery compatible with the 50-69% luminal narrowing range. Further  evaluation with CTA could performed as clinically indicated. 2. Moderate amount of right-sided atherosclerotic plaque, similar to the 03/2019 examination, and again not resulting in a hemodynamically significant stenosis. 3. Incidental note made of an approximately 1.4 cm hypoechoic nodule which is noted to contain several internal macrocalcifications - further evaluation with dedicated thyroid ultrasound could be performed as clinically indicated. Electronically Signed   By: Sandi Mariscal M.D.   On: 08/21/2019 12:21   ECHOCARDIOGRAM COMPLETE  Result Date: 08/21/2019    ECHOCARDIOGRAM REPORT   Patient Name:   Brianna Dodson Date of Exam: 08/21/2019 Medical Rec #:  [id number]     Height:       65.0 in Accession #:    [id number]    Weight:       179.6 lb Date of Birth:  1933/11/14      BSA:          1.89 m Patient Age:    68 years      BP:           167/61 mmHg Patient Gender: F             HR:           64 bpm. Exam Location:  [facility] Procedure: 2D Echo, Cardiac Doppler and Color Doppler Indications:     Stroke 434.91  History:         Patient has prior history of Echocardiogram examinations, most                  recent 04/14/2019.  Sonographer:     Sherrie Sport RDCS (AE) Referring Phys:  [id number] Val Riles Diagnosing Phys: Ida Rogue MD IMPRESSIONS  1. Left ventricular ejection fraction, by estimation, is 60 to 65%. The left ventricle has normal function. The left ventricle has no regional wall motion abnormalities. Left ventricular diastolic parameters are consistent with Grade I diastolic dysfunction (impaired relaxation).  2. Right ventricular systolic function is normal. The right ventricular size is normal. There is normal pulmonary artery systolic pressure. The estimated right ventricular systolic pressure is 50.9 mmHg. FINDINGS  Left Ventricle: Left ventricular ejection fraction, by estimation, is 60 to 65%. The left ventricle has normal function. The left ventricle has no regional wall motion abnormalities.  The left ventricular internal cavity size was normal in size. There is  no left ventricular hypertrophy. Left ventricular diastolic parameters are consistent with Grade I diastolic dysfunction (impaired relaxation). Right Ventricle: The right ventricular size is normal. No increase in right ventricular wall thickness. Right ventricular  systolic function is normal. There is normal pulmonary artery systolic pressure. The tricuspid regurgitant velocity is 2.23 m/s, and  with an assumed right atrial pressure of 10 mmHg, the estimated right ventricular systolic pressure is 35.4 mmHg. Left Atrium: Left atrial size was normal in size. Right Atrium: Right atrial size was normal in size. Pericardium: There is no evidence of pericardial effusion. Mitral Valve: The mitral valve is normal in structure and function. Normal mobility of the mitral valve leaflets. Mild mitral valve regurgitation. No evidence of mitral valve stenosis. Tricuspid Valve: The tricuspid valve is normal in structure. Tricuspid valve regurgitation is not demonstrated. No evidence of tricuspid stenosis. Aortic Valve: The aortic valve was not well visualized. Aortic valve regurgitation is trivial. No aortic stenosis is present. Aortic valve mean gradient measures 4.5 mmHg. Aortic valve peak gradient measures 8.1 mmHg. Aortic valve area, by VTI measures 2.85 cm. Pulmonic Valve: The pulmonic valve was normal in structure. Pulmonic valve regurgitation is not visualized. No evidence of pulmonic stenosis. Aorta: The aortic root is normal in size and structure. Venous: The inferior vena cava is normal in size with greater than 50% respiratory variability, suggesting right atrial pressure of 3 mmHg. IAS/Shunts: No atrial level shunt detected by color flow Doppler.  LEFT VENTRICLE PLAX 2D LVIDd:         4.27 cm  Diastology LVIDs:         2.65 cm  LV e' lateral:   7.07 cm/s LV PW:         1.35 cm  LV E/e' lateral: 11.3 LV IVS:        1.38 cm  LV e' medial:    4.57  cm/s LVOT diam:     2.00 cm  LV E/e' medial:  17.5 LV SV:         93.93 ml LV SV Index:   28.55 LVOT Area:     3.14 cm  RIGHT VENTRICLE RV Basal diam:  2.43 cm RV S prime:     9.90 cm/s TAPSE (M-mode): 3.0 cm LEFT ATRIUM             Index       RIGHT ATRIUM           Index LA diam:        3.70 cm 1.96 cm/m  RA Area:     10.00 cm LA Vol (A2C):   40.2 ml 21.27 ml/m RA Volume:   20.20 ml  10.69 ml/m LA Vol (A4C):   38.5 ml 20.37 ml/m LA Biplane Vol: 42.7 ml 22.60 ml/m  AORTIC VALVE AV Area (Vmax):    2.57 cm AV Area (Vmean):   2.67 cm AV Area (VTI):     2.85 cm AV Vmax:           142.00 cm/s AV Vmean:          98.850 cm/s AV VTI:            0.330 m AV Peak Grad:      8.1 mmHg AV Mean Grad:      4.5 mmHg LVOT Vmax:         116.00 cm/s LVOT Vmean:        84.000 cm/s LVOT VTI:          0.299 m LVOT/AV VTI ratio: 0.91  AORTA Ao Root diam: 3.40 cm MITRAL VALVE                TRICUSPID VALVE MV Area (PHT):  2.76 cm     TR Peak grad:   19.9 mmHg MV Decel Time: 275 msec     TR Vmax:        223.00 cm/s MV E velocity: 80.00 cm/s MV A velocity: 107.00 cm/s  SHUNTS MV E/A ratio:  0.75         Systemic VTI:  0.30 m                             Systemic Diam: 2.00 cm Ida Rogue MD Electronically signed by Ida Rogue MD Signature Date/Time: 08/21/2019/11:31:29 AM    Final    US THYROID  Result Date: 09/05/2019 CLINICAL DATA:  Nodule noted on carotid Doppler EXAM: THYROID ULTRASOUND TECHNIQUE: Ultrasound examination of the thyroid gland and adjacent soft tissues was performed. COMPARISON:  None. FINDINGS: Parenchymal Echotexture: Mildly heterogenous Isthmus: 0.4 cm thickness Right lobe: 3.5 x 1.9 x 1.8 cm Left lobe: 4.2 x 1.8 x 2 cm _________________________________________________________ Estimated total number of nodules >/= 1 cm: 4 Number of spongiform nodules >/=  2 cm not described below (TR1): 0 Number of mixed cystic and solid nodules >/= 1.5 cm not described below (TR2): 0  _________________________________________________________ Nodule # 1: Location: Right; Superior Maximum size: 0.6 cm; Other 2 dimensions: 0.6 x 0.3 cm Composition: mixed cystic and solid (1) Echogenicity: hypoechoic (2) Shape: not taller-than-wide (0) Margins: smooth (0) Echogenic foci: large comet-tail artifacts (0) ACR TI-RADS total points: 3. ACR TI-RADS risk category: TR3 (3 points). ACR TI-RADS recommendations: Given size (<1.4 cm) and appearance, this nodule does NOT meet TI-RADS criteria for biopsy or dedicated follow-up. _________________________________________________________ Nodule # 2: 0.7 cm hypoechoic nodule without calcifications, superior right; This nodule does NOT meet TI-RADS criteria for biopsy or dedicated follow-up. Nodule # 3: 1.3 cm benign colloid cyst, mid right; This nodule does NOT meet TI-RADS criteria for biopsy or dedicated follow-up. Nodule # 4: 1 cm benign colloid cyst, superior left; This nodule does NOT meet TI-RADS criteria for biopsy or dedicated follow-up. Nodule # 5: Location: Left; Mid Maximum size: 1.2 cm; Other 2 dimensions: 1 x 0.9 cm Composition: solid/almost completely solid (2) Echogenicity: hypoechoic (2) Shape: not taller-than-wide (0) Margins: ill-defined (0) Echogenic foci: macrocalcifications (1) ACR TI-RADS total points: 5. ACR TI-RADS risk category: TR4 (4-6 points). ACR TI-RADS recommendations: *Given size (>/= 1 - 1.4 cm) and appearance, a follow-up ultrasound in 1 year should be considered based on TI-RADS criteria. _________________________________________________________ Nodule # 6: Location: Left; Inferior Maximum size: 1.3 cm; Other 2 dimensions: 0.9 x 0.7 cm Composition: mixed cystic and solid (1) Echogenicity: isoechoic (1) Shape: not taller-than-wide (0) Margins: smooth (0) Echogenic foci: none (0) ACR TI-RADS total points: 2. ACR TI-RADS risk category: TR2 (2 points). ACR TI-RADS recommendations: This nodule does NOT meet TI-RADS criteria for biopsy or  dedicated follow-up. _________________________________________________________ IMPRESSION: 1. Normal-sized thyroid with bilateral nodules as above. None meets criteria for biopsy. 2. Recommend annual/biennial ultrasound follow-up of mid left nodule as above, until stability x5 years confirmed. The above is in keeping with the ACR TI-RADS recommendations - J Am Coll Radiol 2017;14:587-595. Electronically Signed   By: Lucrezia Europe M.D.   On: 09/05/2019 07:28    Labs Recent Results (from the past 2160 hour(s))  SARS CORONAVIRUS 2 (TAT 6-24 HRS) Nasopharyngeal Nasopharyngeal Swab     Status: None   Collection Time: 07/25/19 10:59 AM   Specimen: Nasopharyngeal Swab  Result Value Ref Range   SARS  Coronavirus 2 NEGATIVE NEGATIVE    Comment: (NOTE) SARS-CoV-2 target nucleic acids are NOT DETECTED. The SARS-CoV-2 RNA is generally detectable in upper and lower respiratory specimens during the acute phase of infection. Negative results do not preclude SARS-CoV-2 infection, do not rule out co-infections with other pathogens, and should not be used as the sole basis for treatment or other patient management decisions. Negative results must be combined with clinical observations, patient history, and epidemiological information. The expected result is Negative. Fact Sheet for Patients: SugarRoll.be Fact Sheet for Healthcare Providers: https://www.woods-mathews.com/ This test is not yet approved or cleared by the [place] FDA and  has been authorized for detection and/or diagnosis of SARS-CoV-2 by FDA under an Emergency Use Authorization (EUA). This EUA will remain  in effect (meaning this test can be used) for the duration of the COVID-19 declaration under Section 56 4(b)(1) of the Act, 21 U.S.C. section 360bbb-3(b)(1), unless the authorization is terminated or revoked sooner. Performed at [facility], [place] [address], [place], [place] [address]     Glucose, capillary     Status: Abnormal   Collection Time: 07/30/19  7:25 AM  Result Value Ref Range   Glucose-Capillary 160 (H) 70 - 99 mg/dL  Protime-INR     Status: None   Collection Time: 08/20/19  1:45 PM  Result Value Ref Range   Prothrombin Time 14.0 11.4 - 15.2 seconds   INR 1.1 0.8 - 1.2    Comment: (NOTE) INR goal varies based on device and disease states. Performed at [facility], [address] [place], [place] [address]   APTT     Status: None   Collection Time: 08/20/19  1:45 PM  Result Value Ref Range   aPTT 33 24 - 36 seconds    Comment: Performed at [facility], [address] [place], [place] [address]  CBC     Status: Abnormal   Collection Time: 08/20/19  1:45 PM  Result Value Ref Range   WBC 4.1 4.0 - 10.5 K/uL   RBC 3.41 (L) 3.87 - 5.11 MIL/uL   Hemoglobin 9.7 (L) 12.0 - 15.0 g/dL   HCT 29.0 (L) 36.0 - 46.0 %   MCV 85.0 80.0 - 100.0 fL   MCH 28.4 26.0 - 34.0 pg   MCHC 33.4 30.0 - 36.0 g/dL   RDW 13.1 11.5 - 15.5 %   Platelets 190 150 - 400 K/uL   nRBC 0.0 0.0 - 0.2 %    Comment: Performed at [facility], [address] [place], [place] [address]  Differential     Status: None   Collection Time: 08/20/19  1:45 PM  Result Value Ref Range   Neutrophils Relative % 66 %   Neutro Abs 2.8 1.7 - 7.7 K/uL   Lymphocytes Relative 22 %   Lymphs Abs 0.9 0.7 - 4.0 K/uL   Monocytes Relative 8 %   Monocytes Absolute 0.3 0.1 - 1.0 K/uL   Eosinophils Relative 3 %   Eosinophils Absolute 0.1 0.0 - 0.5 K/uL   Basophils Relative 1 %   Basophils Absolute 0.0 0.0 - 0.1 K/uL   Immature Granulocytes 0 %   Abs Immature Granulocytes 0.01 0.00 - 0.07 K/uL    Comment: Performed at [facility], [address] [place], [place] [address]  Comprehensive metabolic panel     Status: Abnormal   Collection Time: 08/20/19  1:45 PM  Result Value Ref Range   Sodium 139 135 - 145  mmol/L   Potassium 4.7 3.5 - 5.1 mmol/L   Chloride 105  98 - 111 mmol/L   CO2 24 22 - 32 mmol/L   Glucose, Bld 223 (H) 70 - 99 mg/dL   BUN 23 8 - 23 mg/dL   Creatinine, Ser 2.26 (H) 0.44 - 1.00 mg/dL   Calcium 8.9 8.9 - 10.3 mg/dL   Total Protein 7.1 6.5 - 8.1 g/dL   Albumin 3.5 3.5 - 5.0 g/dL   AST 25 15 - 41 U/L   ALT 15 0 - 44 U/L   Alkaline Phosphatase 46 38 - 126 U/L   Total Bilirubin 0.9 0.3 - 1.2 mg/dL   GFR calc non Af Amer 19 (L) >60 mL/min   GFR calc Af Amer 23 (L) >60 mL/min   Anion gap 10 5 - 15    Comment: Performed at [facility], [address] [place], [place] [address]  Hemoglobin A1c     Status: Abnormal   Collection Time: 08/20/19  1:45 PM  Result Value Ref Range   Hgb A1c MFr Bld 8.1 (H) 4.8 - 5.6 %    Comment: REPEATED TO VERIFY (NOTE) Pre diabetes:          5.7%-6.4% Diabetes:              >6.4% Glycemic control for   <7.0% adults with diabetes    Mean Plasma Glucose 185.77 mg/dL    Comment: Performed at [facility], [place] [address] [place], [place] [address]  Respiratory Panel by RT PCR (Flu A&B, Covid) - Nasopharyngeal Swab     Status: None   Collection Time: 08/20/19  4:21 PM   Specimen: Nasopharyngeal Swab  Result Value Ref Range   SARS Coronavirus 2 by RT PCR NEGATIVE NEGATIVE    Comment: (NOTE) SARS-CoV-2 target nucleic acids are NOT DETECTED. The SARS-CoV-2 RNA is generally detectable in upper respiratoy specimens during the acute phase of infection. The lowest concentration of SARS-CoV-2 viral copies this assay can detect is 131 copies/mL. A negative result does not preclude SARS-Cov-2 infection and should not be used as the sole basis for treatment or other patient management decisions. A negative result may occur with  improper specimen collection/handling, submission of specimen other than nasopharyngeal swab, presence of viral mutation(s) within the areas targeted by this assay, and inadequate number of viral copies (<131 copies/mL). A negative result must be combined with  clinical observations, patient history, and epidemiological information. The expected result is Negative. Fact Sheet for Patients:  PinkCheek.be Fact Sheet for Healthcare Providers:  GravelBags.it This test is not yet ap proved or cleared by the [place] FDA and  has been authorized for detection and/or diagnosis of SARS-CoV-2 by FDA under an Emergency Use Authorization (EUA). This EUA will remain  in effect (meaning this test can be used) for the duration of the COVID-19 declaration under Section 564(b)(1) of the Act, 21 U.S.C. section 360bbb-3(b)(1), unless the authorization is terminated or revoked sooner.    Influenza A by PCR NEGATIVE NEGATIVE   Influenza B by PCR NEGATIVE NEGATIVE    Comment: (NOTE) The Xpert Xpress SARS-CoV-2/FLU/RSV assay is intended as an aid in  the diagnosis of influenza from Nasopharyngeal swab specimens and  should not be used as a sole basis for treatment. Nasal washings and  aspirates are unacceptable for Xpert Xpress SARS-CoV-2/FLU/RSV  testing. Fact Sheet for Patients: PinkCheek.be Fact Sheet for Healthcare Providers: GravelBags.it This test is not yet approved or cleared by  the [employer] and  has been authorized for detection and/or diagnosis of SARS-CoV-2 by  FDA under an Emergency Use Authorization (EUA). This EUA will remain  in effect (meaning this test can be used) for the duration of the  Covid-19 declaration under Section 564(b)(1) of the Act, 21  U.S.C. section 360bbb-3(b)(1), unless the authorization is  terminated or revoked. Performed at [facility], [address] [place], [place] [address]   Glucose, capillary     Status: Abnormal   Collection Time: 08/20/19  6:07 PM  Result Value Ref Range   Glucose-Capillary 111 (H) 70 - 99 mg/dL  Glucose, capillary     Status: None   Collection Time:  08/20/19  8:35 PM  Result Value Ref Range   Glucose-Capillary 93 70 - 99 mg/dL  Glucose, capillary     Status: None   Collection Time: 08/21/19 12:38 AM  Result Value Ref Range   Glucose-Capillary 94 70 - 99 mg/dL  Glucose, capillary     Status: Abnormal   Collection Time: 08/21/19  4:32 AM  Result Value Ref Range   Glucose-Capillary 115 (H) 70 - 99 mg/dL  Hemoglobin A1c     Status: Abnormal   Collection Time: 08/21/19  5:34 AM  Result Value Ref Range   Hgb A1c MFr Bld 7.5 (H) 4.8 - 5.6 %    Comment: (NOTE) Pre diabetes:          5.7%-6.4% Diabetes:              >6.4% Glycemic control for   <7.0% adults with diabetes    Mean Plasma Glucose 168.55 mg/dL    Comment: Performed at [facility], [place] [address], [place], [place] [address]  Lipid panel     Status: Abnormal   Collection Time: 08/21/19  5:34 AM  Result Value Ref Range   Cholesterol 172 0 - 200 mg/dL   Triglycerides 154 (H) <150 mg/dL   HDL 29 (L) >40 mg/dL   Total CHOL/HDL Ratio 5.9 RATIO   VLDL 31 0 - 40 mg/dL   LDL Cholesterol 112 (H) 0 - 99 mg/dL    Comment:        Total Cholesterol/HDL:CHD Risk Coronary Heart Disease Risk Table                     Men   Women  1/2 Average Risk   3.4   3.3  Average Risk       5.0   4.4  2 X Average Risk   9.6   7.1  3 X Average Risk  23.4   11.0        Use the calculated Patient Ratio above and the CHD Risk Table to determine the patient's CHD Risk.        ATP III CLASSIFICATION (LDL):  <100     mg/dL   Optimal  100-129  mg/dL   Near or Above                    Optimal  130-159  mg/dL   Borderline  160-189  mg/dL   High  >190     mg/dL   Very High Performed at [facility], [address] [place], [place] [address]   TSH     Status: None   Collection Time: 08/21/19  5:34 AM  Result Value Ref Range   TSH 1.031 0.350 - 4.500 uIU/mL    Comment:  Performed by a 3rd Generation assay with a functional sensitivity of <=0.01 uIU/mL. Performed at [facility], [address] [place], [place] [address]   CBC     Status: Abnormal   Collection Time: 08/21/19  5:34 AM  Result Value Ref Range   WBC 4.1 4.0 - 10.5 K/uL   RBC 2.97 (L) 3.87 - 5.11 MIL/uL   Hemoglobin 8.4 (L) 12.0 - 15.0 g/dL   HCT 24.9 (L) 36.0 - 46.0 %   MCV 83.8 80.0 - 100.0 fL   MCH 28.3 26.0 - 34.0 pg   MCHC 33.7 30.0 - 36.0 g/dL   RDW 13.0 11.5 - 15.5 %   Platelets 156 150 - 400 K/uL   nRBC 0.0 0.0 - 0.2 %    Comment: Performed at [facility], [address] [place], [place] [address]  Basic metabolic panel     Status: Abnormal   Collection Time: 08/21/19  5:34 AM  Result Value Ref Range   Sodium 141 135 - 145 mmol/L   Potassium 4.6 3.5 - 5.1 mmol/L   Chloride 110 98 - 111 mmol/L   CO2 25 22 - 32 mmol/L   Glucose, Bld 117 (H) 70 - 99 mg/dL   BUN 24 (H) 8 - 23 mg/dL   Creatinine, Ser 2.01 (H) 0.44 - 1.00 mg/dL   Calcium 8.3 (L) 8.9 - 10.3 mg/dL   GFR calc non Af Amer 22 (L) >60 mL/min   GFR calc Af Amer 26 (L) >60 mL/min   Anion gap 6 5 - 15    Comment: Performed at [facility], [address] [place], [place] [address]  Magnesium     Status: None   Collection Time: 08/21/19  5:34 AM  Result Value Ref Range   Magnesium 2.1 1.7 - 2.4 mg/dL    Comment: Performed at [facility], [address] [place], [place] [address]  Phosphorus     Status: None   Collection Time: 08/21/19  5:34 AM  Result Value Ref Range   Phosphorus 3.7 2.5 - 4.6 mg/dL    Comment: Performed at [facility], [address] [place],  [address]  Glucose, capillary     Status: Abnormal   Collection Time: 08/21/19  7:34 AM  Result Value Ref Range   Glucose-Capillary 117 (H) 70 - 99 mg/dL  ECHOCARDIOGRAM COMPLETE     Status: None   Collection Time: 08/21/19  9:38 AM  Result Value Ref Range   Weight 2,873.6 oz   Height 65 in   BP 167/61 mmHg  Glucose, capillary     Status: Abnormal   Collection Time: 08/21/19 11:55 AM  Result  Value Ref Range   Glucose-Capillary 201 (H) 70 - 99 mg/dL  Glucose, capillary     Status: Abnormal   Collection Time: 08/21/19  4:36 PM  Result Value Ref Range   Glucose-Capillary 132 (H) 70 - 99 mg/dL  Glucose, capillary     Status: Abnormal   Collection Time: 08/21/19  8:17 PM  Result Value Ref Range   Glucose-Capillary 157 (H) 70 - 99 mg/dL  Glucose, capillary     Status: Abnormal   Collection Time: 08/21/19 11:47 PM  Result Value Ref Range   Glucose-Capillary 131 (H) 70 - 99 mg/dL  CBC     Status: Abnormal   Collection Time: 08/22/19  4:49 AM  Result Value Ref Range   WBC 3.9 (L) 4.0 - 10.5 K/uL   RBC 3.13 (  L) 3.87 - 5.11 MIL/uL   Hemoglobin 8.8 (L) 12.0 - 15.0 g/dL   HCT 26.3 (L) 36.0 - 46.0 %   MCV 84.0 80.0 - 100.0 fL   MCH 28.1 26.0 - 34.0 pg   MCHC 33.5 30.0 - 36.0 g/dL   RDW 12.7 11.5 - 15.5 %   Platelets 140 (L) 150 - 400 K/uL   nRBC 0.0 0.0 - 0.2 %    Comment: Performed at [facility], [address] [place], [place] [address]  Basic metabolic panel     Status: Abnormal   Collection Time: 08/22/19  4:49 AM  Result Value Ref Range   Sodium 141 135 - 145 mmol/L   Potassium 4.5 3.5 - 5.1 mmol/L   Chloride 110 98 - 111 mmol/L   CO2 24 22 - 32 mmol/L   Glucose, Bld 144 (H) 70 - 99 mg/dL   BUN 24 (H) 8 - 23 mg/dL   Creatinine, Ser 1.96 (H) 0.44 - 1.00 mg/dL   Calcium 8.0 (L) 8.9 - 10.3 mg/dL   GFR calc non Af Amer 23 (L) >60 mL/min   GFR calc Af Amer 27 (L) >60 mL/min   Anion gap 7 5 - 15    Comment: Performed at [facility], [address] [place], [place] [address]  Glucose, capillary     Status: Abnormal   Collection Time: 08/22/19  8:28 AM  Result Value Ref Range   Glucose-Capillary 144 (H) 70 - 99 mg/dL   Comment 1 Notify RN    Comment 2 Document in Chart   Glucose, capillary     Status: Abnormal   Collection Time: 08/22/19 12:18 PM  Result Value Ref Range   Glucose-Capillary 202 (H) 70 - 99 mg/dL   Comment 1 Notify RN     Comment 2 Document in Chart   CBC     Status: Abnormal   Collection Time: 09/03/19  1:38 PM  Result Value Ref Range   WBC 4.8 4.0 - 10.5 K/uL   RBC 3.53 (L) 3.87 - 5.11 Mil/uL   Platelets 225.0 150.0 - 400.0 K/uL   Hemoglobin 10.3 (L) 12.0 - 15.0 g/dL   HCT 29.4 (L) 36.0 - 46.0 %   MCV 83.1 78.0 - 100.0 fl   MCHC 35.0 30.0 - 36.0 g/dL   RDW 14.0 11.5 - 15.5 %    Assessment/Plan:  Essential hypertension blood pressure control important in reducing the progression of atherosclerotic disease. On appropriate oral medications.   CKD stage 4 due to type 2 diabetes mellitus ([place]) Would hold off on angiogram due to her kidney disease unless duplex suggest worsening disease.  DM type 2 (diabetes mellitus, type 2) (HCC) blood glucose control important in reducing the progression of atherosclerotic disease. Also, involved in wound healing. On appropriate medications.   HLD (hyperlipidemia) lipid control important in reducing the progression of atherosclerotic disease. Continue statin therapy   CVA (cerebral vascular accident) ([place]) At her level of carotid disease, this was possibly the source but not likely.  More likely was due to hypertension or thromboembolic phenomenon.  Carotid stenosis Recent carotid duplex suggested mild right carotid artery stenosis and stenosis that may fall just into the 50 to 69% range on the left.  Although this is possibly the source of her stroke at that level, it is less likely.  Given her age and other comorbidities I would hold off on getting a CT angiogram due to her renal insufficiency.  I would plan  a duplex and follow-up at short interval of about 3 months at this point.  She will continue aspirin, Plavix, and Crestor.      Leotis Pain 09/09/2019, 12:24 PM   This note was created with Dragon medical transcription system.  Any errors from dictation are unintentional.

## 2019-09-09 NOTE — Assessment & Plan Note (Signed)
lipid control important in reducing the progression of atherosclerotic disease. Continue statin therapy  

## 2019-09-09 NOTE — Assessment & Plan Note (Signed)
Recent carotid duplex suggested mild right carotid artery stenosis and stenosis that may fall just into the 50 to 69% range on the left.  Although this is possibly the source of her stroke at that level, it is less likely.  Given her age and other comorbidities I would hold off on getting a CT angiogram due to her renal insufficiency.  I would plan a duplex and follow-up at short interval of about 3 months at this point.  She will continue aspirin, Plavix, and Crestor.

## 2019-09-09 NOTE — Assessment & Plan Note (Signed)
Would hold off on angiogram due to her kidney disease unless duplex suggest worsening disease.

## 2019-09-09 NOTE — Assessment & Plan Note (Signed)
blood pressure control important in reducing the progression of atherosclerotic disease. On appropriate oral medications.  

## 2019-09-11 DIAGNOSIS — I69328 Other speech and language deficits following cerebral infarction: Secondary | ICD-10-CM | POA: Diagnosis not present

## 2019-09-11 DIAGNOSIS — I69354 Hemiplegia and hemiparesis following cerebral infarction affecting left non-dominant side: Secondary | ICD-10-CM | POA: Diagnosis not present

## 2019-09-11 DIAGNOSIS — D631 Anemia in chronic kidney disease: Secondary | ICD-10-CM | POA: Diagnosis not present

## 2019-09-11 DIAGNOSIS — N184 Chronic kidney disease, stage 4 (severe): Secondary | ICD-10-CM | POA: Diagnosis not present

## 2019-09-11 DIAGNOSIS — E1122 Type 2 diabetes mellitus with diabetic chronic kidney disease: Secondary | ICD-10-CM | POA: Diagnosis not present

## 2019-09-11 DIAGNOSIS — I10 Essential (primary) hypertension: Secondary | ICD-10-CM | POA: Diagnosis not present

## 2019-09-12 NOTE — Telephone Encounter (Signed)
Patient is scheduled to see provider.  Shana Zavaleta,cma

## 2019-09-16 DIAGNOSIS — I69328 Other speech and language deficits following cerebral infarction: Secondary | ICD-10-CM | POA: Diagnosis not present

## 2019-09-16 DIAGNOSIS — I69354 Hemiplegia and hemiparesis following cerebral infarction affecting left non-dominant side: Secondary | ICD-10-CM | POA: Diagnosis not present

## 2019-09-16 DIAGNOSIS — E1122 Type 2 diabetes mellitus with diabetic chronic kidney disease: Secondary | ICD-10-CM | POA: Diagnosis not present

## 2019-09-16 DIAGNOSIS — D631 Anemia in chronic kidney disease: Secondary | ICD-10-CM | POA: Diagnosis not present

## 2019-09-16 DIAGNOSIS — N184 Chronic kidney disease, stage 4 (severe): Secondary | ICD-10-CM | POA: Diagnosis not present

## 2019-09-16 DIAGNOSIS — I10 Essential (primary) hypertension: Secondary | ICD-10-CM | POA: Diagnosis not present

## 2019-09-17 DIAGNOSIS — N184 Chronic kidney disease, stage 4 (severe): Secondary | ICD-10-CM | POA: Diagnosis not present

## 2019-09-17 DIAGNOSIS — I10 Essential (primary) hypertension: Secondary | ICD-10-CM | POA: Diagnosis not present

## 2019-09-17 DIAGNOSIS — E785 Hyperlipidemia, unspecified: Secondary | ICD-10-CM | POA: Diagnosis not present

## 2019-09-17 DIAGNOSIS — E1165 Type 2 diabetes mellitus with hyperglycemia: Secondary | ICD-10-CM | POA: Diagnosis not present

## 2019-09-17 DIAGNOSIS — E1169 Type 2 diabetes mellitus with other specified complication: Secondary | ICD-10-CM | POA: Diagnosis not present

## 2019-09-18 DIAGNOSIS — I69354 Hemiplegia and hemiparesis following cerebral infarction affecting left non-dominant side: Secondary | ICD-10-CM | POA: Diagnosis not present

## 2019-09-18 DIAGNOSIS — I10 Essential (primary) hypertension: Secondary | ICD-10-CM | POA: Diagnosis not present

## 2019-09-18 DIAGNOSIS — I69328 Other speech and language deficits following cerebral infarction: Secondary | ICD-10-CM | POA: Diagnosis not present

## 2019-09-18 DIAGNOSIS — N184 Chronic kidney disease, stage 4 (severe): Secondary | ICD-10-CM | POA: Diagnosis not present

## 2019-09-18 DIAGNOSIS — E1122 Type 2 diabetes mellitus with diabetic chronic kidney disease: Secondary | ICD-10-CM | POA: Diagnosis not present

## 2019-09-18 DIAGNOSIS — D631 Anemia in chronic kidney disease: Secondary | ICD-10-CM | POA: Diagnosis not present

## 2019-09-23 DIAGNOSIS — E1122 Type 2 diabetes mellitus with diabetic chronic kidney disease: Secondary | ICD-10-CM | POA: Diagnosis not present

## 2019-09-23 DIAGNOSIS — I69354 Hemiplegia and hemiparesis following cerebral infarction affecting left non-dominant side: Secondary | ICD-10-CM | POA: Diagnosis not present

## 2019-09-23 DIAGNOSIS — D631 Anemia in chronic kidney disease: Secondary | ICD-10-CM | POA: Diagnosis not present

## 2019-09-23 DIAGNOSIS — N184 Chronic kidney disease, stage 4 (severe): Secondary | ICD-10-CM | POA: Diagnosis not present

## 2019-09-23 DIAGNOSIS — I69328 Other speech and language deficits following cerebral infarction: Secondary | ICD-10-CM | POA: Diagnosis not present

## 2019-09-23 DIAGNOSIS — I10 Essential (primary) hypertension: Secondary | ICD-10-CM | POA: Diagnosis not present

## 2019-09-25 DIAGNOSIS — I10 Essential (primary) hypertension: Secondary | ICD-10-CM | POA: Diagnosis not present

## 2019-09-25 DIAGNOSIS — I69354 Hemiplegia and hemiparesis following cerebral infarction affecting left non-dominant side: Secondary | ICD-10-CM | POA: Diagnosis not present

## 2019-09-25 DIAGNOSIS — D631 Anemia in chronic kidney disease: Secondary | ICD-10-CM | POA: Diagnosis not present

## 2019-09-25 DIAGNOSIS — N184 Chronic kidney disease, stage 4 (severe): Secondary | ICD-10-CM | POA: Diagnosis not present

## 2019-09-25 DIAGNOSIS — E1122 Type 2 diabetes mellitus with diabetic chronic kidney disease: Secondary | ICD-10-CM | POA: Diagnosis not present

## 2019-09-25 DIAGNOSIS — I69328 Other speech and language deficits following cerebral infarction: Secondary | ICD-10-CM | POA: Diagnosis not present

## 2019-09-26 ENCOUNTER — Other Ambulatory Visit: Payer: Self-pay | Admitting: Family Medicine

## 2019-09-26 DIAGNOSIS — N183 Chronic kidney disease, stage 3 unspecified: Secondary | ICD-10-CM

## 2019-09-26 DIAGNOSIS — E1122 Type 2 diabetes mellitus with diabetic chronic kidney disease: Secondary | ICD-10-CM

## 2019-09-27 DIAGNOSIS — I639 Cerebral infarction, unspecified: Secondary | ICD-10-CM | POA: Diagnosis not present

## 2019-09-28 DIAGNOSIS — N184 Chronic kidney disease, stage 4 (severe): Secondary | ICD-10-CM | POA: Diagnosis not present

## 2019-09-28 DIAGNOSIS — Z7982 Long term (current) use of aspirin: Secondary | ICD-10-CM | POA: Diagnosis not present

## 2019-09-28 DIAGNOSIS — Z9181 History of falling: Secondary | ICD-10-CM | POA: Diagnosis not present

## 2019-09-28 DIAGNOSIS — E1122 Type 2 diabetes mellitus with diabetic chronic kidney disease: Secondary | ICD-10-CM | POA: Diagnosis not present

## 2019-09-28 DIAGNOSIS — I2510 Atherosclerotic heart disease of native coronary artery without angina pectoris: Secondary | ICD-10-CM | POA: Diagnosis not present

## 2019-09-28 DIAGNOSIS — D631 Anemia in chronic kidney disease: Secondary | ICD-10-CM | POA: Diagnosis not present

## 2019-09-28 DIAGNOSIS — I10 Essential (primary) hypertension: Secondary | ICD-10-CM | POA: Diagnosis not present

## 2019-09-28 DIAGNOSIS — E785 Hyperlipidemia, unspecified: Secondary | ICD-10-CM | POA: Diagnosis not present

## 2019-09-28 DIAGNOSIS — I69354 Hemiplegia and hemiparesis following cerebral infarction affecting left non-dominant side: Secondary | ICD-10-CM | POA: Diagnosis not present

## 2019-09-28 DIAGNOSIS — I69328 Other speech and language deficits following cerebral infarction: Secondary | ICD-10-CM | POA: Diagnosis not present

## 2019-09-28 DIAGNOSIS — Z7902 Long term (current) use of antithrombotics/antiplatelets: Secondary | ICD-10-CM | POA: Diagnosis not present

## 2019-10-01 ENCOUNTER — Telehealth: Payer: Self-pay | Admitting: Family Medicine

## 2019-10-01 DIAGNOSIS — I10 Essential (primary) hypertension: Secondary | ICD-10-CM

## 2019-10-01 MED ORDER — EZETIMIBE 10 MG PO TABS: 10.0000 mg | ORAL_TABLET | Freq: Every day | ORAL | 3 refills | Status: DC

## 2019-10-01 MED ORDER — LISINOPRIL 10 MG PO TABS: 10.0000 mg | ORAL_TABLET | Freq: Every day | ORAL | 1 refills | Status: DC

## 2019-10-01 NOTE — Telephone Encounter (Signed)
Pt is currently is out of ezetimibe. She also needs lisinopril refilled and sent to [facility].

## 2019-10-06 DIAGNOSIS — Z95818 Presence of other cardiac implants and grafts: Secondary | ICD-10-CM | POA: Diagnosis not present

## 2019-10-06 DIAGNOSIS — E1122 Type 2 diabetes mellitus with diabetic chronic kidney disease: Secondary | ICD-10-CM | POA: Diagnosis not present

## 2019-10-06 DIAGNOSIS — N183 Chronic kidney disease, stage 3 unspecified: Secondary | ICD-10-CM | POA: Diagnosis not present

## 2019-10-06 DIAGNOSIS — I63431 Cerebral infarction due to embolism of right posterior cerebral artery: Secondary | ICD-10-CM | POA: Diagnosis not present

## 2019-10-06 DIAGNOSIS — I639 Cerebral infarction, unspecified: Secondary | ICD-10-CM | POA: Diagnosis not present

## 2019-10-06 DIAGNOSIS — Z8673 Personal history of transient ischemic attack (TIA), and cerebral infarction without residual deficits: Secondary | ICD-10-CM | POA: Diagnosis not present

## 2019-10-06 DIAGNOSIS — I10 Essential (primary) hypertension: Secondary | ICD-10-CM | POA: Diagnosis not present

## 2019-10-08 ENCOUNTER — Ambulatory Visit: Payer: Medicare Other | Admitting: Family Medicine

## 2019-10-08 DIAGNOSIS — Z0289 Encounter for other administrative examinations: Secondary | ICD-10-CM

## 2019-10-09 ENCOUNTER — Other Ambulatory Visit: Payer: Medicare Other

## 2019-10-14 DIAGNOSIS — N184 Chronic kidney disease, stage 4 (severe): Secondary | ICD-10-CM | POA: Diagnosis not present

## 2019-10-14 DIAGNOSIS — I129 Hypertensive chronic kidney disease with stage 1 through stage 4 chronic kidney disease, or unspecified chronic kidney disease: Secondary | ICD-10-CM | POA: Diagnosis not present

## 2019-10-14 DIAGNOSIS — R809 Proteinuria, unspecified: Secondary | ICD-10-CM | POA: Diagnosis not present

## 2019-10-14 DIAGNOSIS — E1122 Type 2 diabetes mellitus with diabetic chronic kidney disease: Secondary | ICD-10-CM | POA: Diagnosis not present

## 2019-10-14 DIAGNOSIS — N189 Chronic kidney disease, unspecified: Secondary | ICD-10-CM | POA: Diagnosis not present

## 2019-10-21 ENCOUNTER — Encounter: Payer: Self-pay | Admitting: Family Medicine

## 2019-10-21 ENCOUNTER — Other Ambulatory Visit: Payer: Self-pay

## 2019-10-21 ENCOUNTER — Ambulatory Visit (INDEPENDENT_AMBULATORY_CARE_PROVIDER_SITE_OTHER): Payer: Medicare Other | Admitting: Family Medicine

## 2019-10-21 VITALS — BP 121/70 | HR 71 | Temp 95.8°F | Ht 65.0 in | Wt 178.4 lb

## 2019-10-21 DIAGNOSIS — I10 Essential (primary) hypertension: Secondary | ICD-10-CM

## 2019-10-21 DIAGNOSIS — L821 Other seborrheic keratosis: Secondary | ICD-10-CM | POA: Diagnosis not present

## 2019-10-21 DIAGNOSIS — I4891 Unspecified atrial fibrillation: Secondary | ICD-10-CM | POA: Insufficient documentation

## 2019-10-21 DIAGNOSIS — E1122 Type 2 diabetes mellitus with diabetic chronic kidney disease: Secondary | ICD-10-CM

## 2019-10-21 DIAGNOSIS — I480 Paroxysmal atrial fibrillation: Secondary | ICD-10-CM | POA: Diagnosis not present

## 2019-10-21 DIAGNOSIS — I639 Cerebral infarction, unspecified: Secondary | ICD-10-CM

## 2019-10-21 DIAGNOSIS — N183 Chronic kidney disease, stage 3 unspecified: Secondary | ICD-10-CM

## 2019-10-21 DIAGNOSIS — R351 Nocturia: Secondary | ICD-10-CM

## 2019-10-21 DIAGNOSIS — I6930 Unspecified sequelae of cerebral infarction: Secondary | ICD-10-CM | POA: Diagnosis not present

## 2019-10-21 DIAGNOSIS — N2581 Secondary hyperparathyroidism of renal origin: Secondary | ICD-10-CM | POA: Diagnosis not present

## 2019-10-21 DIAGNOSIS — E785 Hyperlipidemia, unspecified: Secondary | ICD-10-CM | POA: Diagnosis not present

## 2019-10-21 LAB — POCT URINALYSIS DIPSTICK
Bilirubin, UA: NEGATIVE
Glucose, UA: NEGATIVE
Ketones, UA: NEGATIVE
Leukocytes, UA: NEGATIVE
Nitrite, UA: NEGATIVE
Protein, UA: POSITIVE — AB
Spec Grav, UA: 1.02 (ref 1.010–1.025)
Urobilinogen, UA: 0.2 E.U./dL
pH, UA: 6 (ref 5.0–8.0)

## 2019-10-21 LAB — LIPID PANEL
Cholesterol: 126 mg/dL (ref 0–200)
HDL: 37.3 mg/dL — ABNORMAL LOW (ref >39.00)
LDL Cholesterol: 63 mg/dL (ref 0–99)
NonHDL: 88.74
Total CHOL/HDL Ratio: 3
Triglycerides: 129 mg/dL (ref 0.0–149.0)
VLDL: 25.8 mg/dL (ref 0.0–40.0)

## 2019-10-21 NOTE — Assessment & Plan Note (Signed)
Well-controlled.  Continue current regimen. 

## 2019-10-21 NOTE — Assessment & Plan Note (Signed)
Check lipid panel.  Continue Zetia and Crestor.

## 2019-10-21 NOTE — Assessment & Plan Note (Signed)
Continues to have aphasia.  She has completed speech therapy.  She wants to drive.  Advised that she needs to be assessed for this prior to driving as neurology previously advised.

## 2019-10-21 NOTE — Assessment & Plan Note (Signed)
She will continue management through nephrology.

## 2019-10-21 NOTE — Assessment & Plan Note (Signed)
Check urinalysis.  UA at nephrology revealed leukocytes though culture revealed mixed flora.

## 2019-10-21 NOTE — Assessment & Plan Note (Signed)
Continue glimepiride.  CBGs seem to be well controlled.  Discussed checking 2 times daily if she can.

## 2019-10-21 NOTE — Progress Notes (Signed)
[name], MD Phone: [phone]  Brianna Dodson is a 86 y.o. female who presents today for f/u.  HYPERTENSION  Disease Monitoring  Home BP Monitoring similar to today Chest pain- no    Dyspnea- no Medications  Compliance-  Taking amlodipine, lisinopril.   Edema- no  DIABETES Disease Monitoring: Blood Sugar ranges-101-109 Polyuria/phagia/dipsia- notes nocturia and urgency      Optho- due Medications: Compliance- taking glimeperide   HYPERLIPIDEMIA Symptoms Chest pain on exertion:  no   Medications: Compliance- taking zetia and crestor Right upper quadrant pain- no  Muscle aches- no  Atrial fibrillation: Diagnosed with this through a Linq monitor.  She has been on Eliquis.  No palpitations.  No bleeding issues.     Social History   Tobacco Use  Smoking Status Never Smoker  Smokeless Tobacco Never Used     ROS see history of present illness  Objective  Physical Exam Vitals:   10/21/19 1054  BP: 121/70  Pulse: 71  Temp: (!) 95.8 F (35.4 C)  SpO2: 98%    BP Readings from Last 3 Encounters:  10/21/19 121/70  09/09/19 119/61  08/22/19 (!) 157/80   Wt Readings from Last 3 Encounters:  10/21/19 178 lb 6.4 oz (80.9 kg)  09/09/19 176 lb (79.8 kg)  08/27/19 176 lb (79.8 kg)    Physical Exam Constitutional:      General: She is not in acute distress.    Appearance: She is not diaphoretic.  Cardiovascular:     Rate and Rhythm: Normal rate and regular rhythm.     Heart sounds: Normal heart sounds.  Pulmonary:     Effort: Pulmonary effort is normal.     Breath sounds: Normal breath sounds.  Chest:    Musculoskeletal:     Right lower leg: No edema.     Left lower leg: No edema.  Skin:    General: Skin is warm and dry.  Neurological:     Mental Status: She is alert.    Diabetic Foot Exam - Simple   Simple Foot Form Diabetic Foot exam was performed with the following findings: Yes 10/21/2019 11:17 AM  Visual Inspection No deformities, no  ulcerations, no other skin breakdown bilaterally: Yes Sensation Testing Intact to touch and monofilament testing bilaterally: Yes Pulse Check Posterior Tibialis and Dorsalis pulse intact bilaterally: Yes Comments      Assessment/Plan: Please see individual problem list.  Essential hypertension Well-controlled.  Continue current regimen.  History of stroke with current residual effects Continues to have aphasia.  She has completed speech therapy.  She wants to drive.  Advised that she needs to be assessed for this prior to driving as neurology previously advised.  HLD (hyperlipidemia) Check lipid panel.  Continue Zetia and Crestor.  DM type 2 (diabetes mellitus, type 2) (HCC) Continue glimepiride.  CBGs seem to be well controlled.  Discussed checking 2 times daily if she can.  Hyperparathyroidism due to renal insufficiency [facility]) She will continue management through nephrology.  Atrial fibrillation ([place]) Currently on Eliquis for stroke prevention.  She will continue this.  She will continue to see cardiology.  Nocturia Check urinalysis.  UA at nephrology revealed leukocytes though culture revealed mixed flora.  Seborrheic keratosis Area on left flank consistent with seborrheic keratosis.  Discussed benign nature of this lesion.   Health Maintenance: Patient reports she has seen ophthalmology in the past year.  Orders Placed This Encounter  Procedures  . Lipid panel  . POCT Urinalysis Dipstick    No  orders of the defined types were placed in this encounter.   This visit occurred during the SARS-CoV-2 public health emergency.  Safety protocols were in place, including screening questions prior to the visit, additional usage of staff PPE, and extensive cleaning of exam room while observing appropriate contact time as indicated for disinfecting solutions.    [name], MD [place]

## 2019-10-21 NOTE — Assessment & Plan Note (Signed)
Currently on Eliquis for stroke prevention.  She will continue this.  She will continue to see cardiology.

## 2019-10-21 NOTE — Assessment & Plan Note (Signed)
Area on left flank consistent with seborrheic keratosis.  Discussed benign nature of this lesion.

## 2019-10-21 NOTE — Patient Instructions (Signed)
Nice to see you. We will get labs today. 

## 2019-10-22 LAB — URINE CULTURE
MICRO NUMBER:: 10384173
SPECIMEN QUALITY:: ADEQUATE

## 2019-10-23 DIAGNOSIS — N184 Chronic kidney disease, stage 4 (severe): Secondary | ICD-10-CM | POA: Diagnosis not present

## 2019-10-23 DIAGNOSIS — N2581 Secondary hyperparathyroidism of renal origin: Secondary | ICD-10-CM | POA: Diagnosis not present

## 2019-10-23 DIAGNOSIS — R809 Proteinuria, unspecified: Secondary | ICD-10-CM | POA: Diagnosis not present

## 2019-10-23 DIAGNOSIS — I129 Hypertensive chronic kidney disease with stage 1 through stage 4 chronic kidney disease, or unspecified chronic kidney disease: Secondary | ICD-10-CM | POA: Diagnosis not present

## 2019-10-23 DIAGNOSIS — E1122 Type 2 diabetes mellitus with diabetic chronic kidney disease: Secondary | ICD-10-CM | POA: Diagnosis not present

## 2019-10-27 ENCOUNTER — Telehealth: Payer: Self-pay | Admitting: Family Medicine

## 2019-10-27 DIAGNOSIS — R8290 Unspecified abnormal findings in urine: Secondary | ICD-10-CM

## 2019-10-27 NOTE — Telephone Encounter (Signed)
Please let the patient know that it appears that part of her urine testing was not completed. She needs to provide another urine sample. Order placed.

## 2019-10-28 NOTE — Telephone Encounter (Signed)
I called and scheduled a lab appointment with the patient  for  a urine test.  Brianna Dodson,cma

## 2019-10-29 ENCOUNTER — Other Ambulatory Visit: Payer: Self-pay

## 2019-10-29 ENCOUNTER — Other Ambulatory Visit (INDEPENDENT_AMBULATORY_CARE_PROVIDER_SITE_OTHER): Payer: Medicare Other

## 2019-10-29 DIAGNOSIS — R8290 Unspecified abnormal findings in urine: Secondary | ICD-10-CM | POA: Diagnosis not present

## 2019-10-29 LAB — URINALYSIS, MICROSCOPIC ONLY

## 2019-10-29 NOTE — Telephone Encounter (Signed)
Noted  

## 2019-10-29 NOTE — Telephone Encounter (Signed)
Pt called back and said that she didn't want to come in and do another urine test she just had one done on 10/23/19 at her [facility]. She will call them and get them to send the results to our office

## 2019-10-29 NOTE — Telephone Encounter (Signed)
Patient came in a left her spacemen.  Brianna Dodson,cma

## 2019-10-30 ENCOUNTER — Other Ambulatory Visit: Payer: Medicare Other

## 2019-11-06 DIAGNOSIS — I480 Paroxysmal atrial fibrillation: Secondary | ICD-10-CM | POA: Diagnosis not present

## 2019-11-06 DIAGNOSIS — I10 Essential (primary) hypertension: Secondary | ICD-10-CM | POA: Diagnosis not present

## 2019-11-06 DIAGNOSIS — I63431 Cerebral infarction due to embolism of right posterior cerebral artery: Secondary | ICD-10-CM | POA: Diagnosis not present

## 2019-11-07 ENCOUNTER — Other Ambulatory Visit: Payer: Self-pay

## 2019-11-07 ENCOUNTER — Encounter: Payer: Self-pay | Admitting: Speech Pathology

## 2019-11-07 ENCOUNTER — Ambulatory Visit: Payer: Medicare Other | Attending: Neurology | Admitting: Speech Pathology

## 2019-11-07 DIAGNOSIS — R1310 Dysphagia, unspecified: Secondary | ICD-10-CM | POA: Diagnosis not present

## 2019-11-07 DIAGNOSIS — R471 Dysarthria and anarthria: Secondary | ICD-10-CM | POA: Diagnosis not present

## 2019-11-07 NOTE — Therapy (Deleted)
[place] MAIN [facility] SERVICES [address] [place], [place], [address] Phone: [phone]   Fax:  [phone]  Speech Language Pathology Evaluation  Patient Details  Name: Brianna Dodson MRN: [id number] Date of Birth: Nov 03, 1933 Referring Provider (SLP): Jennings Books   Encounter Date: 11/07/2019  End of Session - 11/07/19 1748    Visit Number  1    Number of Visits  25    Date for SLP Re-Evaluation  01/30/20    Authorization Type  Medicare    Authorization Time Period  5/7 - 01/30/2020    Authorization - Visit Number  1    Authorization - Number of Visits  25    Progress Note Due on Visit  10    SLP Start Time  1000    SLP Stop Time   1030    SLP Time Calculation (min)  30 min    Activity Tolerance  Patient tolerated treatment well       Past Medical History:  Diagnosis Date  . CKD (chronic kidney disease), stage III   . Diabetes mellitus without complication ([place])   . History of blood transfusion   . Hyperlipidemia   . Hypertension   . Stroke [facility])     Past Surgical History:  Procedure Laterality Date  . ABDOMINAL HYSTERECTOMY  1985  . LOOP RECORDER INSERTION N/A 07/30/2019   Procedure: LOOP RECORDER INSERTION;  Surgeon: Isaias Cowman, MD;  Location: [place] [facility];  Service: Cardiovascular;  Laterality: N/A;  . TEE WITHOUT CARDIOVERSION N/A 04/14/2019   Procedure: TRANSESOPHAGEAL ECHOCARDIOGRAM (TEE);  Surgeon: Teodoro Spray, MD;  Location: [facility] OR[facility];  Service: Cardiovascular;  Laterality: N/A;    There were no vitals filed for this visit.  Subjective Assessment - 11/07/19 1740    Subjective  Pt is very pleasant and eager to improve her speech intelligibility.    Patient is accompained by:  --   personal caregiver   Currently in Pain?  No/denies         SLP Evaluation [facility] - 11/07/19 0001      SLP Visit Information   SLP Received On  11/07/19    Referring Provider (SLP)  Jennings Books    Onset Date   10/28/2019    Medical Diagnosis  late effect CVA dysarthria and dysphagia      Subjective   Subjective  Pt pleasant, good historian    Patient/Family Stated Goal  to speak clearer and not to cough when drinking      General Information   HPI  Pt is 87 y/o female with past medical history of CVA with residual left arm weakness, HTN, HLD, NIDDM, CKD stage IV, CVAs in September and October (2020) and latest CVA in 08/2019. Pt has some baseline mild dysarthria.    Behavioral/Cognition  cognitively intact    Mobility Status  ambulatory      Prior Functional Status   Cognitive/Linguistic Baseline  Within functional limits    Type of Home  House     Lives With  Spouse    Available Support  Personal care attendant      Cognition   Overall Cognitive Status  Within Functional Limits for tasks assessed      Auditory Comprehension   Overall Auditory Comprehension  Appears within functional limits for tasks assessed      Visual Recognition/Discrimination   Discrimination  Within Function Limits      Reading Comprehension   Reading Status  Not  tested      Expression   Primary Mode of Expression  Verbal      Verbal Expression   Overall Verbal Expression  Appears within functional limits for tasks assessed      Written Expression   Dominant Hand  Right      Oral Motor/Sensory Function   Overall Oral Motor/Sensory Function  Impaired    Lingual Strength  Reduced    Lingual Coordination  Reduced    Overall Oral Motor/Sensory Function  imprecise articulation      Motor Speech   Overall Motor Speech  Impaired    Respiration  Within functional limits    Phonation  Normal    Resonance  Within functional limits    Articulation  Impaired    Level of Impairment  Sentence    Intelligibility  Intelligibility reduced    Word  75-100% accurate    Phrase  75-100% accurate    Sentence  50-74% accurate    Conversation  50-74% accurate    Motor Planning  Witnin functional limits    Motor Speech  Errors  Not applicable    Effective Techniques  Over-articulate;Slow rate    Phonation  WFL       Subjective Assessment - 11/07/19 1744      Symptoms/Limitations   Subjective  Pt would like to drink without coughing    Patient is accompained by:  --      Pain Assessment   Currently in Pain?  No/denies      Prior Functional Status - 11/07/19 1744      Prior Functional Status   Cognitive/Linguistic Baseline  Within functional limits    Type of Home  House     Lives With  Spouse    Available Help at Discharge  Family;Available 24 hours/day      General - 11/07/19 1745      General Information   Date of Onset  08/20/19    HPI  Pt is 86 y/o female with past medical history of CVA with residual left arm weakness, HTN, HLD, NIDDM, CKD stage IV, CVAs in September and October (2020) and latest CVA in 08/2019. Pt has some baseline mild dysarthria.    Type of Study  Bedside Swallow Evaluation    Previous Swallow Assessment  none in chart    Diet Prior to this Study  Regular;Thin liquids    Temperature Spikes Noted  No    Respiratory Status  Room air    History of Recent Intubation  No    Behavior/Cognition  Alert;Cooperative;Pleasant mood    Oral Cavity Assessment  Within Functional Limits    Oral Care Completed by SLP  No    Oral Cavity - Dentition  Adequate natural dentition    Vision  Functional for self-feeding    Self-Feeding Abilities  Able to feed self    Patient Positioning  Upright in chair    Baseline Vocal Quality  Normal    Volitional Cough  Strong    Volitional Swallow  Able to elicit       Oral Motor/Sensory Function - 11/07/19 1746      Oral Motor/Sensory Function   Overall Oral Motor/Sensory Function  Moderate impairment    Facial ROM  Within Functional Limits    Facial Symmetry  Within Functional Limits    Facial Strength  Within Functional Limits    Facial Sensation  Within Functional Limits    Lingual ROM  Reduced right;Reduced left  Lingual Symmetry   Abnormal symmetry right;Abnormal symmetry left    Lingual Strength  Reduced    Lingual Sensation  Within Functional Limits    Velum  Within Functional Limits    Mandible  Within Functional Limits      Ice Chips - 11/07/19 1747      Ice Chips   Ice chips  Not tested      Thin Liquid - 11/07/19 1747      Thin Liquid   Thin Liquid  Impaired    Presentation  Cup;Self Fed    Pharyngeal  Phase Impairments  Cough - Delayed;Cough - Immediate;Suspected delayed Swallow      Nectar thick liquid - 11/07/19 1747      Nectar Thick Liquid   Nectar Thick Liquid  Not tested      Honey Thick Liquid - 11/07/19 1747      Honey Thick Liquid   Honey Thick Liquid  Not tested      Puree - 11/07/19 1747      Puree   Puree  Not tested        ADULT SLP TREATMENT - 11/07/19 0001      General Information   Behavior/Cognition  Alert;Cooperative;Pleasant mood    Patient Positioning  Upright in bed    Oral care provided  N/A    HPI  Pt is 86 y/o female with past medical history of CVA with residual left arm weakness, HTN, HLD, NIDDM, CKD stage IV, CVAs in September and October (2020) and latest CVA in 08/2019. Pt has some baseline mild dysarthria.         SLP Education - 11/07/19 1742    Education Details  MBS would further assess pt's ability to protect her airway    Person(s) Educated  Patient;Caregiver(s)    Methods  Explanation;Demonstration       SLP Short Term Goals - 11/07/19 1732      SLP SHORT TERM GOAL #1   Title  Pt will improve oral motor strength and coordination by completing oral motor exercises with min SLP cues.    Baseline  Maximal assistance to complete    Time  2    Period  Weeks    Status  New    Target Date  11/21/19      SLP SHORT TERM GOAL #2   Title  Pt will improve speech intelligibility for multi-syllabic words by controlling rate of speech, over-articulation, and increased loudness to achieve 80% intelligibility with minimal SLP cues.    Baseline   Maximal Assistance    Time  2    Period  Weeks    Status  New    Target Date  11/21/19       SLP Long Term Goals - 11/07/19 1733      SLP LONG TERM GOAL #1   Title  Pt will improve oral motor strength and coordination by completing oral motor exercises independently.    Baseline  Maximal assistance    Time  12    Period  Weeks    Status  New      SLP LONG TERM GOAL #2   Title  Pt will improve speech intelligibility for connected by controlling rate of speech, over-articulation, and increased loudness to achieve 80% intelligibility.    Baseline  Maximal assistance required to achieve ~ 50% intelligibility    Time  12    Period  Weeks    Status  New    Target Date  01/30/20       Plan - 11/07/19 1748    Clinical Impression Statement  Per pt report she frequently coughes when consuming thin liquids via cup sips. During this exam, pt consumed thin water via controlled cup sips. She had an immediate and delayed cough x 3 over 5 oz. Pt is likely at a reduced risk of developing aspiration pneumonia d/t pt is ambulatory. However recommend an instrumental swallow study (Modified Barium Swallow Study) to assess impairment and provide direction for possible dysphagia therapy.    Speech Therapy Frequency  2x / week    Duration  --   12   Treatment/Interventions  Other (comment)   Modified Barium Swallow Study   Potential to Achieve Goals  Good    Consulted and Agree with Plan of Care  Patient;Family member/caregiver       Patient will benefit from skilled therapeutic intervention in order to improve the following deficits and impairments:   Dysarthria and anarthria - Plan: SLP plan of care cert/re-cert  Dysphagia, unspecified type    Problem List Patient Active Problem List   Diagnosis Date Noted  . Dysarthria and anarthria 11/07/2019  . Atrial fibrillation ([place]) 10/21/2019  . Nocturia 10/21/2019  . Seborrheic keratosis 10/21/2019  . Carotid stenosis 08/27/2019  . Thyroid  nodule 08/27/2019  . Status post placement of implantable loop recorder 08/07/2019  . Anemia 04/18/2019  . Aphasia 04/12/2019  . Benign hypertensive renal disease 04/07/2019  . Hyperparathyroidism due to renal insufficiency ([place]) 04/07/2019  . 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 Proteinuria 04/07/2019  . History of stroke with current residual effects 03/15/2019  . Positional numbness and tingling in both hands, ulnar aspect 09/19/2018  . Hyperlipidemia associated with type 2 diabetes mellitus ([place]) 07/25/2018  . Contusion of left shoulder 04/11/2018  . Itching 10/23/2017  . Nevus 04/17/2017  . CKD stage 4 due to type 2 diabetes mellitus ([place]) 02/02/2015  . Vitamin D deficiency 02/02/2015  . Obesity (BMI 30-39.9) 08/03/2014  . DM type 2 (diabetes mellitus, type 2) ([place]) 01/29/2014  . Essential hypertension 01/13/2014  . HLD (hyperlipidemia) 01/13/2014    Brianna Dodson 11/07/2019, 6:28 PM  [place] MAIN [facility] SERVICES [address] [place], [place], [address] Phone: [phone]   Fax:  [phone]  Name: Brianna Dodson MRN: [id number] Date of Birth: [birth year]

## 2019-11-07 NOTE — Addendum Note (Signed)
Addended by: Merleen Milliner on: 11/07/2019 06:45 PM   Modules accepted: Orders

## 2019-11-07 NOTE — Therapy (Signed)
[place] MAIN [facility] SERVICES [address] [place], [place], [address] Phone: [phone]   Fax:  [place]  Patient Details  Name: Brianna Dodson MRN: [id number] Date of Birth: 05-25-1934 Referring Provider (SLP): Brianna Dodson   Encounter Date: 11/07/2019  End of Session - 11/07/19 1748    Visit Number  1    Number of Visits  25    Date for SLP Re-Evaluation  01/30/20    Authorization Type  Medicare    Authorization Time Period  5/7 - 01/30/2020    Authorization - Visit Number  1    Authorization - Number of Visits  25    Progress Note Due on Visit  10    SLP Start Time  1000    SLP Stop Time   1030    SLP Time Calculation (min)  30 min    Activity Tolerance  Patient tolerated treatment well       Past Medical History:  Diagnosis Date  . CKD (chronic kidney disease), stage III   . Diabetes mellitus without complication ([place])   . History of blood transfusion   . Hyperlipidemia   . Hypertension   . Stroke [facility])     Past Surgical History:  Procedure Laterality Date  . ABDOMINAL HYSTERECTOMY  1985  . LOOP RECORDER INSERTION N/A 07/30/2019   Procedure: LOOP RECORDER INSERTION;  Surgeon: Brianna Cowman, MD;  Location: [place] [facility];  Service: Cardiovascular;  Laterality: N/A;  . TEE WITHOUT CARDIOVERSION N/A 04/14/2019   Procedure: TRANSESOPHAGEAL ECHOCARDIOGRAM (TEE);  Surgeon: Brianna Spray, MD;  Location: [facility] OR[facility];  Service: Cardiovascular;  Laterality: N/A;     SLP Evaluation [facility] - 11/07/19 0001      SLP Visit Information   SLP Received On  11/07/19    Referring Provider (SLP)  Brianna Dodson    Onset Date  10/28/2019    Medical Diagnosis  late effect CVA dysarthria and dysphagia    Subjective Assessment - 11/07/19 1744      Symptoms/Limitations   Subjective  Pt would like to drink without coughing    Patient is accompained by:  personal caregiver     Pain Assessment   Currently in  Pain?  No/denies      Prior Functional Status - 11/07/19 1744      Prior Functional Status   Cognitive/Linguistic Baseline  Within functional limits    Type of Home  House     Lives With  Spouse    Available Help at Discharge  Family;Available 24 hours/day      General - 11/07/19 1745      General Information   Date of Onset  08/20/19    HPI  Pt is 86 y/o female with past medical history of CVA with residual left arm weakness, HTN, HLD, NIDDM, CKD stage IV, CVAs in September and October (2020) and latest CVA in 08/2019. Pt has some baseline mild dysarthria.    Type of Study  Bedside Swallow Evaluation    Previous Swallow Assessment  none in chart    Diet Prior to this Study  Regular;Thin liquids    Temperature Spikes Noted  No    Respiratory Status  Room air    History of Recent Intubation  No    Behavior/Cognition  Alert;Cooperative;Pleasant mood    Oral Cavity Assessment  Within Functional Limits    Oral Care Completed by SLP  No    Oral Cavity -  Dentition  Adequate natural dentition    Vision  Functional for self-feeding    Self-Feeding Abilities  Able to feed self    Patient Positioning  Upright in chair    Baseline Vocal Quality  Normal    Volitional Cough  Strong    Volitional Swallow  Able to elicit       Oral Motor/Sensory Function - 11/07/19 1746      Oral Motor/Sensory Function   Overall Oral Motor/Sensory Function  Moderate impairment    Facial ROM  Within Functional Limits    Facial Symmetry  Within Functional Limits    Facial Strength  Within Functional Limits    Facial Sensation  Within Functional Limits    Lingual ROM  Reduced right;Reduced left    Lingual Symmetry  Abnormal symmetry right;Abnormal symmetry left    Lingual Strength  Reduced    Lingual Sensation  Within Functional Limits    Velum  Within Functional Limits    Mandible  Within Functional Limits      Ice Chips - 11/07/19 1747      Ice Chips   Ice chips  Not tested      Thin Liquid -  11/07/19 1747      Thin Liquid   Thin Liquid  Impaired    Presentation  Cup;Self Fed    Pharyngeal  Phase Impairments  Cough - Delayed;Cough - Immediate;Suspected delayed Swallow      Nectar thick liquid - 11/07/19 1747      Nectar Thick Liquid   Nectar Thick Liquid  Not tested      Honey Thick Liquid - 11/07/19 1747      Honey Thick Liquid   Honey Thick Liquid  Not tested      Puree - 11/07/19 1747      Puree   Puree  Not tested       SLP Education - 11/07/19 1742    Education Details  MBS would further assess pt's ability to protect her airway    Person(s) Educated  Patient;Caregiver(s)    Methods  Explanation;Demonstration      Clinical Impression Statement:  Per pt report she frequently coughes when consuming thin liquids via cup sips. During this exam, pt consumed thin water via controlled cup sips. She had an immediate and delayed cough x 3 over 5 oz. Pt is likely at a reduced risk of developing aspiration pneumonia d/t pt is ambulatory. However recommend an instrumental swallow study (Modified Barium Swallow Study) to assess impairment and provide direction for possible dysphagia therapy.    Speech Therapy Frequency  2x / week    Duration  --   12   Treatment/Interventions  Other (comment)   Modified Barium Swallow Study   Potential to Achieve Goals  Good    Consulted and Agree with Plan of Care  Patient;Family member/caregiver       Patient will benefit from skilled therapeutic intervention in order to improve the following deficits and impairments:   Dysarthria and anarthria - Plan: SLP plan of care cert/re-cert  Dysphagia, unspecified type    Brianna Dodson 11/07/2019, 6:28 PM  [place] MAIN [facility] SERVICES [address] [place], [place], [address] Phone: [phone]   Fax:  [phone]  Name: Brianna Dodson MRN: [id number] Date of Birth: 03/15/1934   Brianna Dodson 11/07/2019, 6:32 PM  [place] [address] [place], [place], [address] Phone: [phone]   Fax:  [phone] 

## 2019-11-07 NOTE — Therapy (Deleted)
[place] MAIN [facility] SERVICES [address] [place], [place], [address] Phone: [phone]   Fax:  [phone]  Patient Details  Name: Brianna Dodson MRN: [id number] Date of Birth: [birth year] Referring Provider:  Vladimir Crofts, MD  Encounter Date: 11/07/2019    Stormy Fabian 11/07/2019, 5:57 PM  [place] MAIN [facility] SERVICES [address] [place], [place], [address] Phone: [phone]   Fax:  [phone]

## 2019-11-07 NOTE — Therapy (Signed)
[place] MAIN [facility] SERVICES [address] [place], [place], [address] Phone: [phone]   Fax:  [phone]  Speech Language Pathology Evaluation  Patient Details  Name: Brianna Dodson MRN: [id number] Date of Birth: 30-Jun-1934 Referring Provider (SLP): Jennings Books   Encounter Date: 11/07/2019  End of Session - 11/07/19 1707    Visit Number  1    Number of Visits  25    Date for SLP Re-Evaluation  01/30/20    Authorization Type  Medicare    Authorization Time Period  5/7 - 01/30/2020    Authorization - Visit Number  1    Authorization - Number of Visits  25    Progress Note Due on Visit  10    SLP Start Time  1030    SLP Stop Time   1100    SLP Time Calculation (min)  30 min    Activity Tolerance  Patient tolerated treatment well       Past Medical History:  Diagnosis Date  . CKD (chronic kidney disease), stage III   . Diabetes mellitus without complication ([place])   . History of blood transfusion   . Hyperlipidemia   . Hypertension   . Stroke [facility])     Past Surgical History:  Procedure Laterality Date  . ABDOMINAL HYSTERECTOMY  1985  . LOOP RECORDER INSERTION N/A 07/30/2019   Procedure: LOOP RECORDER INSERTION;  Surgeon: Isaias Cowman, MD;  Location: [place] [facility];  Service: Cardiovascular;  Laterality: N/A;  . TEE WITHOUT CARDIOVERSION N/A 04/14/2019   Procedure: TRANSESOPHAGEAL ECHOCARDIOGRAM (TEE);  Surgeon: Teodoro Spray, MD;  Location: [facility] ORS;  Service: Cardiovascular;  Laterality: N/A;    There were no vitals filed for this visit.  Subjective Assessment - 11/07/19 1705    Subjective  Pt is very pleasant and eager to improve her speech intelligibility.    Patient is accompained by:  --   Personal Caregiver   Currently in Pain?  No/denies         SLP Evaluation [facility] - 11/07/19 0001      SLP Visit Information   SLP Received On  11/07/19    Referring Provider (SLP)  Jennings Books    Onset Date   10/28/2019    Medical Diagnosis  late effect CVA dysarthria and dysphagia      Subjective   Subjective  Pt pleasant, good historian    Patient/Family Stated Goal  to speak clearer and not to cough when drinking      General Information   HPI  Pt is 86 y/o female with past medical history of CVA with residual left arm weakness, HTN, HLD, NIDDM, CKD stage IV, CVAs in September and October (2020) and latest CVA in 08/2019. Pt has some baseline mild dysarthria.    Behavioral/Cognition  cognitively intact    Mobility Status  ambulatory      Prior Functional Status   Cognitive/Linguistic Baseline  Within functional limits    Type of Home  House     Lives With  Spouse    Available Support  Personal care attendant      Cognition   Overall Cognitive Status  Within Functional Limits for tasks assessed      Auditory Comprehension   Overall Auditory Comprehension  Appears within functional limits for tasks assessed      Visual Recognition/Discrimination   Discrimination  Within Function Limits      Reading Comprehension   Reading Status  Not  tested      Expression   Primary Mode of Expression  Verbal      Verbal Expression   Overall Verbal Expression  Appears within functional limits for tasks assessed      Written Expression   Dominant Hand  Right      Oral Motor/Sensory Function   Overall Oral Motor/Sensory Function  Impaired    Lingual Strength  Reduced    Lingual Coordination  Reduced    Overall Oral Motor/Sensory Function  imprecise articulation      Motor Speech   Overall Motor Speech  Impaired    Respiration  Within functional limits    Phonation  Normal    Resonance  Within functional limits    Articulation  Impaired    Level of Impairment  Sentence    Intelligibility  Intelligibility reduced    Word  75-100% accurate    Phrase  75-100% accurate    Sentence  50-74% accurate    Conversation  50-74% accurate    Motor Planning  Witnin functional limits    Motor Speech  Errors  Not applicable    Effective Techniques  Over-articulate;Slow rate    Phonation  [facility]      SLP Education - 11/07/19 1705    Education Details  explained role of Outpatient ST with improving speech intelligibility    Person(s) Educated  Patient;Caregiver(s)    Methods  Explanation;Demonstration    Comprehension  Verbalized understanding       SLP Short Term Goals - 11/07/19 1732      SLP SHORT TERM GOAL #1   Title Pt will improve oral motor strength and coordination by completing oral motor exercises with Minimal SLP cues.   Pt will improve oral motor strength and coordination by completing oral motor exercises with min SLP cues.    Baseline Maximal assistance to complete  Maximal assistance to complete    Time  2    Period  Weeks    Status  New    Target Date  11/21/19      SLP SHORT TERM GOAL #2   Title  Pt will improve speech intelligibility for multi-syllabic words by controlling rate of speech, over-articulation, and increased loudness to achieve 80% intelligibility with minimal SLP cues.    Baseline  Maximal Assistance    Time  2    Period  Weeks    Status  New    Target Date  11/21/19       SLP Long Term Goals - 11/07/19 1733      SLP LONG TERM GOAL #1   Title  Pt will improve oral motor strength and coordination by completing oral motor exercises independently.    Baseline  Maximal assistance    Time  12    Period  Weeks    Status  New      SLP LONG TERM GOAL #2   Title  Pt will improve speech intelligibility for connected by controlling rate of speech, over-articulation, and increased loudness to achieve 80% intelligibility.    Baseline  Maximal assistance required to achieve ~ 50% intelligibility    Time  12    Period  Weeks    Status  New    Target Date  01/30/20       Plan - 11/07/19 1709     Pt presents with moderate dysarthria that negatively affects her ability to express her wants/needs. Pt's dysarthria is c/b decreased articulatory precise  and decreased prosody. Pt  reports that her speech is labor-some. She is very good at self-monitoring her speech but attempts at self-correcting are minimally successful. Pt's speech intelligibility is ~ 50% at the conservation level when communicating topics to unfamiliar listener. Skilled ST services are medically necessary to improve pt's ability to expressively communicate her wants and needs.    2x / week    Other (comment)   12   Functional tasks;Oral motor exercises    Good    Patient                              Patient will benefit from skilled therapeutic intervention in order to improve the following deficits and impairments:   Dysarthria and anarthria    Problem List Patient Active Problem List   Diagnosis Date Noted  . Dysarthria and anarthria 11/07/2019  . Atrial fibrillation ([place]) 10/21/2019  . Nocturia 10/21/2019  . Seborrheic keratosis 10/21/2019  . Carotid stenosis 08/27/2019  . Thyroid nodule 08/27/2019  . Status post placement of implantable loop recorder 08/07/2019  . Anemia 04/18/2019  . Aphasia 04/12/2019  . Benign hypertensive renal disease 04/07/2019  . Hyperparathyroidism due to renal insufficiency ([place]) 04/07/2019  . 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 Proteinuria 04/07/2019  . History of stroke with current residual effects 03/15/2019  . Positional numbness and tingling in both hands, ulnar aspect 09/19/2018  . Hyperlipidemia associated with type 2 diabetes mellitus ([place]) 07/25/2018  . Contusion of left shoulder 04/11/2018  . Itching 10/23/2017  . Nevus 04/17/2017  . CKD stage 4 due to type 2 diabetes mellitus ([place]) 02/02/2015  . Vitamin D deficiency 02/02/2015  . Obesity (BMI 30-39.9) 08/03/2014  . DM type 2 (diabetes mellitus, type 2) ([address] [place] [address]) 01/29/2014  . Essential hypertension 01/13/2014  . HLD (hyperlipidemia) 01/13/2014    Jasher Barkan B. Rutherford Nail M.S.,  CCC-SLP, [place] Pathologist Rehabilitation Services Office [phone]   Stormy Fabian 11/07/2019, 5:37 PM  [place] MAIN [facility] SERVICES [address] [place], [place], [address] Phone: [phone]   Fax:  [phone]  Name: Jessieca Rhem MRN: [id number] Date of Birth: 1934-02-01

## 2019-11-07 NOTE — Therapy (Deleted)
[place] MAIN [facility] SERVICES [address] [place], [place], [address] Phone: [phone]   Fax:  [phone]  Patient Details  Name: Brianna Dodson MRN: [id number] Date of Birth: 1933-11-29 Referring Provider:  Vladimir Crofts, MD  Encounter Date: 11/07/2019        End of Session - 11/07/19 1707    Visit Number  1    Number of Visits  25    Date for SLP Re-Evaluation  01/30/20    Authorization Type  Medicare    Authorization Time Period  5/7 - 01/30/2020    Authorization - Visit Number  1    Authorization - Number of Visits  25    Progress Note Due on Visit  10    SLP Start Time  1000   SLP Stop Time   1030   SLP Time Calculation (min)  30 min    Activity Tolerance  Patient tolerated treatment well           Past Medical History:  Diagnosis Date  . CKD (chronic kidney disease), stage III   . Diabetes mellitus without complication ([place])   . History of blood transfusion   . Hyperlipidemia   . Hypertension   . Stroke [facility])          Past Surgical History:  Procedure Laterality Date  . ABDOMINAL HYSTERECTOMY  1985  . LOOP RECORDER INSERTION N/A 07/30/2019   Procedure: LOOP RECORDER INSERTION;  Surgeon: Isaias Cowman, MD;  Location: [place] [facility];  Service: Cardiovascular;  Laterality: N/A;  . TEE WITHOUT CARDIOVERSION N/A 04/14/2019   Procedure: TRANSESOPHAGEAL ECHOCARDIOGRAM (TEE);  Surgeon: Teodoro Spray, MD;  Location: [facility] ORS;  Service: Cardiovascular;  Laterality: N/A;    There were no vitals filed for this visit.      Subjective Assessment - 11/07/19 1705    Subjective  Pt is very pleasant and eager to improve her speech intelligibility.    Patient is accompained by:  --   Personal Caregiver   Currently in Pain?  No/denies           SLP Evaluation [facility] - 11/07/19 0001            SLP Visit Information   SLP Received On  11/07/19    Referring Provider (SLP)   Jennings Books    Onset Date  10/28/2019    Medical Diagnosis  late effect CVA dysarthria and dysphagia        Subjective   Subjective  Pt pleasant, good historian    Patient/Family Stated Goal  to speak clearer and not to cough when drinking        General Information   HPI  Pt is 86 y/o female with past medical history of CVA with residual left arm weakness, HTN, HLD, NIDDM, CKD stage IV, CVAs in September and October (2020) and latest CVA in 08/2019. Pt has some baseline mild dysarthria.    Behavioral/Cognition  cognitively intact    Mobility Status  ambulatory        Per pt report she frequently coughes when consuming thin liquids via cup sips. During this exam, pt consumed thin water via controlled cup sips. She had an immediate and delayed cough x 3 over 5 oz. Pt is likely at a reduced risk of developing aspiration pneumonia d/t pt is ambulatory. However recommend an instrumental swallow study (Modified Barium Swallow Study) to assess impairment and provide direction for possible dysphagia therapy.   Eliud Polo  Mart Piggs, M.S., CCC-SLP, [place] Pathologist Rehabilitation Services Office [phone]   Stormy Fabian 11/07/2019, 6:24 PM  [place] MAIN [facility] SERVICES [address] [place], [place], [address] Phone: [phone]   Fax:  [phone]

## 2019-11-10 ENCOUNTER — Encounter: Payer: Self-pay | Admitting: Speech Pathology

## 2019-11-10 ENCOUNTER — Ambulatory Visit: Payer: Medicare Other | Admitting: Speech Pathology

## 2019-11-10 ENCOUNTER — Other Ambulatory Visit: Payer: Self-pay | Admitting: Internal Medicine

## 2019-11-10 ENCOUNTER — Telehealth: Payer: Self-pay | Admitting: Family Medicine

## 2019-11-10 ENCOUNTER — Other Ambulatory Visit: Payer: Self-pay

## 2019-11-10 DIAGNOSIS — R1310 Dysphagia, unspecified: Secondary | ICD-10-CM | POA: Diagnosis not present

## 2019-11-10 DIAGNOSIS — R471 Dysarthria and anarthria: Secondary | ICD-10-CM | POA: Diagnosis not present

## 2019-11-10 NOTE — Addendum Note (Signed)
Addended by: [name] on: 11/10/2019 03:58 PM   Modules accepted: Orders

## 2019-11-10 NOTE — Telephone Encounter (Signed)
Please check with the speech therapy department to make sure I ordered this referral correctly. One of the speech therapists sent me instructions for the order, but I want to make sure it is correct. Thanks.

## 2019-11-10 NOTE — Therapy (Signed)
[place] MAIN [facility] SERVICES [address] [place], [place], [address] Phone: [phone]   Fax:  [phone]  Speech Language Pathology Treatment  Patient Details  Name: Brianna Dodson MRN: [id number] Date of Birth: [birth year] Referring Provider (SLP): Jennings Books   Encounter Date: 11/10/2019  End of Session - 11/10/19 1541    Visit Number  2    Number of Visits  25    Date for SLP Re-Evaluation  01/30/20    Authorization Type  Medicare    Authorization Time Period  5/7 - 01/30/2020    Authorization - Visit Number  2    Authorization - Number of Visits  25    Progress Note Due on Visit  10    SLP Start Time  1000    SLP Stop Time   1100    SLP Time Calculation (min)  60 min    Activity Tolerance  Patient tolerated treatment well       Past Medical History:  Diagnosis Date  . CKD (chronic kidney disease), stage III   . Diabetes mellitus without complication ([place])   . History of blood transfusion   . Hyperlipidemia   . Hypertension   . Stroke [facility])     Past Surgical History:  Procedure Laterality Date  . ABDOMINAL HYSTERECTOMY  1985  . LOOP RECORDER INSERTION N/A 07/30/2019   Procedure: LOOP RECORDER INSERTION;  Surgeon: Isaias Cowman, MD;  Location: [place] [facility] LAB;  Service: Cardiovascular;  Laterality: N/A;  . TEE WITHOUT CARDIOVERSION N/A 04/14/2019   Procedure: TRANSESOPHAGEAL ECHOCARDIOGRAM (TEE);  Surgeon: Teodoro Spray, MD;  Location: [facility] ORS;  Service: Cardiovascular;  Laterality: N/A;    There were no vitals filed for this visit.  Subjective Assessment - 11/10/19 1539    Subjective  Pt eager to participate and able to demonstrate initial strategies    Patient is accompained by:  --   personal caregiver   Currently in Pain?  No/denies            ADULT SLP TREATMENT - 11/10/19 0001      General Information   Behavior/Cognition  Alert;Cooperative;Pleasant mood    Patient Positioning  Upright in  chair    Oral care provided  N/A    HPI  Pt is 86 y/o female with past medical history of CVA with residual left arm weakness, HTN, HLD, NIDDM, CKD stage IV, CVAs in September and October (2020) and latest CVA in 08/2019. Pt has some baseline mild dysarthria.       Treatment Provided   Treatment provided  Cognitive-Linquistic      Pain Assessment   Pain Assessment  No/denies pain      Cognitive-Linquistic Treatment   Treatment focused on  Dysarthria    Skilled Treatment  Skilled treatment session targeted pt's speech intelligibility goals. SLP facilitated session by providing Maximal cues faded to Minimal cues to produce short phrases with appropriate articulation and increased rate of speech and prosody. Specifically, pt omitted phonation of /s/ in /x/. With SLP repetition, pt able to include all sounds in final /x/. To promote carryover into less structured speech, pt asked to identify pictures of objects and state a general comment about the picture. Overall pt able to demonstrate awareness and include all sounds. She required Moderate cues to increase fluency/prosody of speech.       Assessment / Recommendations / Plan   Plan  Continue with current plan of care  Progression Toward Goals   Progression toward goals  Progressing toward goals       SLP Education - 11/10/19 1540    Education Details  Speech intelligibility strategies demonstrated    Person(s) Educated  Patient;Caregiver(s)    Methods  Explanation;Demonstration    Comprehension  Verbalized understanding       SLP Short Term Goals - 11/07/19 1732      SLP SHORT TERM GOAL #1   Title  Pt will improve oral motor strength and coordination by completing oral motor exercises with min SLP cues.    Baseline  Maximal assistance to complete    Time  2    Period  Weeks    Status  New    Target Date  11/21/19      SLP SHORT TERM GOAL #2   Title  Pt will improve speech intelligibility for multi-syllabic words by  controlling rate of speech, over-articulation, and increased loudness to achieve 80% intelligibility with minimal SLP cues.    Baseline  Maximal Assistance    Time  2    Period  Weeks    Status  New    Target Date  11/21/19       SLP Long Term Goals - 11/07/19 1733      SLP LONG TERM GOAL #1   Title  Pt will improve oral motor strength and coordination by completing oral motor exercises independently.    Baseline  Maximal assistance    Time  12    Period  Weeks    Status  New      SLP LONG TERM GOAL #2   Title  Pt will improve speech intelligibility for connected by controlling rate of speech, over-articulation, and increased loudness to achieve 80% intelligibility.    Baseline  Maximal assistance required to achieve ~ 50% intelligibility    Time  12    Period  Weeks    Status  New    Target Date  01/30/20       Plan - 11/10/19 1541    Clinical Impression Statement  Pt demonstrates great stimulability of concepts taught within speech therapy. She is more aware of omitted sounds and with cued practice she can increase fluency/prosody of her speech at the simple phrase level. Her personal caregiver was present and able to notice the improvements. Daily practice was given for pt to complete with help of her personal caregiver.    Speech Therapy Frequency  2x / week    Duration  --   12   Treatment/Interventions  Functional tasks;Compensatory strategies;SLP instruction and feedback    Potential to Achieve Goals  Good    SLP Home Exercise Plan  simple phrases including specific sound /x/    Consulted and Agree with Plan of Care  Patient;Family member/caregiver       Patient will benefit from skilled therapeutic intervention in order to improve the following deficits and impairments:   Dysarthria and anarthria    Problem List Patient Active Problem List   Diagnosis Date Noted  . Dysarthria and anarthria 11/07/2019  . Atrial fibrillation ([place]) 10/21/2019  . Nocturia  10/21/2019  . Seborrheic keratosis 10/21/2019  . Carotid stenosis 08/27/2019  . Thyroid nodule 08/27/2019  . Status post placement of implantable loop recorder 08/07/2019  . Anemia 04/18/2019  . Aphasia 04/12/2019  . Benign hypertensive renal disease 04/07/2019  . Hyperparathyroidism due to renal insufficiency ([place]) 04/07/2019  . 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 Proteinuria 04/07/2019  . History of stroke with current residual effects 03/15/2019  . Positional numbness and tingling in both hands, ulnar aspect 09/19/2018  . Hyperlipidemia associated with type 2 diabetes mellitus ([place]) 07/25/2018  . Contusion of left shoulder 04/11/2018  . Itching 10/23/2017  . Nevus 04/17/2017  . CKD stage 4 due to type 2 diabetes mellitus ([place]) 02/02/2015  . Vitamin D deficiency 02/02/2015  . Obesity (BMI 30-39.9) 08/03/2014  . DM type 2 (diabetes mellitus, type 2) ([place]) 01/29/2014  . Essential hypertension 01/13/2014  . HLD (hyperlipidemia) 01/13/2014   Brittie Whisnant B. Rutherford Nail M.S., CCC-SLP, [place] Pathologist Rehabilitation Services Office [phone]   Stormy Fabian 11/10/2019, 3:43 PM  [place] MAIN [facility] SERVICES [address] [place], [place], [address] Phone: [phone]   Fax:  [phone]   Name: Evadne Ose MRN: [id number] Date of Birth: 1933/11/19

## 2019-11-10 NOTE — Telephone Encounter (Signed)
Barium swallow ordered

## 2019-11-12 ENCOUNTER — Ambulatory Visit: Payer: Medicare Other | Admitting: Speech Pathology

## 2019-11-12 ENCOUNTER — Encounter: Payer: Self-pay | Admitting: Speech Pathology

## 2019-11-12 ENCOUNTER — Other Ambulatory Visit: Payer: Self-pay

## 2019-11-12 DIAGNOSIS — R471 Dysarthria and anarthria: Secondary | ICD-10-CM | POA: Diagnosis not present

## 2019-11-12 DIAGNOSIS — R1310 Dysphagia, unspecified: Secondary | ICD-10-CM | POA: Diagnosis not present

## 2019-11-12 NOTE — Therapy (Signed)
[place] MAIN [facility] SERVICES [address] [place], [place], [address] Phone: [phone]   Fax:  [phone]  Speech Language Pathology Treatment  Patient Details  Name: Brianna Dodson MRN: [id number] Date of Birth: October 07, 1933 Referring Provider (SLP): Jennings Books   Encounter Date: 11/12/2019  End of Session - 11/12/19 1530    Visit Number  3    Number of Visits  25    Date for SLP Re-Evaluation  01/30/20    Authorization Type  Medicare    Authorization Time Period  5/7 - 01/30/2020    Authorization - Visit Number  3    Authorization - Number of Visits  25    Progress Note Due on Visit  10    SLP Start Time  1000    SLP Stop Time   1100    SLP Time Calculation (min)  60 min    Activity Tolerance  Patient tolerated treatment well       Past Medical History:  Diagnosis Date  . CKD (chronic kidney disease), stage III   . Diabetes mellitus without complication ([place])   . History of blood transfusion   . Hyperlipidemia   . Hypertension   . Stroke [facility])     Past Surgical History:  Procedure Laterality Date  . ABDOMINAL HYSTERECTOMY  1985  . LOOP RECORDER INSERTION N/A 07/30/2019   Procedure: LOOP RECORDER INSERTION;  Surgeon: Isaias Cowman, MD;  Location: [place] CV [facility];  Service: Cardiovascular;  Laterality: N/A;  . TEE WITHOUT CARDIOVERSION N/A 04/14/2019   Procedure: TRANSESOPHAGEAL ECHOCARDIOGRAM (TEE);  Surgeon: Teodoro Spray, MD;  Location: [facility] ORS;  Service: Cardiovascular;  Laterality: N/A;    There were no vitals filed for this visit.  Subjective Assessment - 11/12/19 1524    Subjective  Pt eager to participate, pleasant and conversant    Patient is accompained by:  --   personal care attendant   Currently in Pain?  No/denies            ADULT SLP TREATMENT - 11/12/19 0001      General Information   Behavior/Cognition  Alert;Cooperative;Pleasant mood    Patient Positioning  Upright in chair    Oral care provided  N/A    HPI  Pt is 86 y/o female with past medical history of CVA with residual left arm weakness, HTN, HLD, NIDDM, CKD stage IV, CVAs in September and October (2020) and latest CVA in 08/2019. Pt has some baseline mild dysarthria.       Treatment Provided   Treatment provided  Cognitive-Linquistic      Pain Assessment   Pain Assessment  No/denies pain      Cognitive-Linquistic Treatment   Treatment focused on  Dysarthria    Skilled Treatment  Skilled treatment session targeted pt's speech intelligibility goals. SLP facilitated session by providing maximal multi-modal cues to increase fluidity of pt's speech at the simple phrase level with familiar phrases. Despite this level of support, pt unable to change staccato speech. After 45 minutes of attempts, pt and caregiver stated that pt's speech has always been staccatic and only her articulation errors are new. This was not previously known to this Probation officer. As a result, SLP facilitated remainder of session by providing minimal assistance to correctly articulate phonemes within familiar phrases and sentences. Pt was able to achieve ~ 90% accuracy when minimal cues were provided.        Assessment / Recommendations / Plan   Plan  Continue with current plan of care      Progression Toward Goals   Progression toward goals  Progressing toward goals       SLP Education - 11/12/19 1530    Person(s) Educated  Patient;Caregiver(s)    Methods  Explanation;Demonstration    Comprehension  Verbalized understanding       SLP Short Term Goals - 11/07/19 1732      SLP SHORT TERM GOAL #1   Title  Pt will improve oral motor strength and coordination by completing oral motor exercises with min SLP cues.    Baseline  Maximal assistance to complete    Time  2    Period  Weeks    Status  New    Target Date  11/21/19      SLP SHORT TERM GOAL #2   Title  Pt will improve speech intelligibility for multi-syllabic words by controlling  rate of speech, over-articulation, and increased loudness to achieve 80% intelligibility with minimal SLP cues.    Baseline  Maximal Assistance    Time  2    Period  Weeks    Status  New    Target Date  11/21/19       SLP Long Term Goals - 11/07/19 1733      SLP LONG TERM GOAL #1   Title  Pt will improve oral motor strength and coordination by completing oral motor exercises independently.    Baseline  Maximal assistance    Time  12    Period  Weeks    Status  New      SLP LONG TERM GOAL #2   Title  Pt will improve speech intelligibility for connected by controlling rate of speech, over-articulation, and increased loudness to achieve 80% intelligibility.    Baseline  Maximal assistance required to achieve ~ 50% intelligibility    Time  12    Period  Weeks    Status  New    Target Date  01/30/20       Plan - 11/12/19 1530    Clinical Impression Statement  Pt demonstrates great stimulability of concepts taught within speech therapy. She is more aware of omitted sounds or incorrectly produced words. Daily practice was given for pt to complete with help of her personal caregiver.    Speech Therapy Frequency  2x / week    Duration  --   12   Treatment/Interventions  Functional tasks;Compensatory strategies;SLP instruction and feedback    Potential to Achieve Goals  Good    SLP Home Exercise Plan  simple phrases including specific sound /x/    Consulted and Agree with Plan of Care  Patient;Family member/caregiver       Patient will benefit from skilled therapeutic intervention in order to improve the following deficits and impairments:   Dysarthria and anarthria    Problem List Patient Active Problem List   Diagnosis Date Noted  . Dysarthria and anarthria 11/07/2019  . Atrial fibrillation ([place]) 10/21/2019  . Nocturia 10/21/2019  . Seborrheic keratosis 10/21/2019  . Carotid stenosis 08/27/2019  . Thyroid nodule 08/27/2019  . Status post placement of implantable loop  recorder 08/07/2019  . Anemia 04/18/2019  . Aphasia 04/12/2019  . Benign hypertensive renal disease 04/07/2019  . Hyperparathyroidism due to renal insufficiency ([place]) 04/07/2019  . 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 Proteinuria 04/07/2019  . History of stroke with current residual effects 03/15/2019  .  Positional numbness and tingling in both hands, ulnar aspect 09/19/2018  . Hyperlipidemia associated with type 2 diabetes mellitus ([place]) 07/25/2018  . Contusion of left shoulder 04/11/2018  . Itching 10/23/2017  . Nevus 04/17/2017  . CKD stage 4 due to type 2 diabetes mellitus ([place]) 02/02/2015  . Vitamin D deficiency 02/02/2015  . Obesity (BMI 30-39.9) 08/03/2014  . DM type 2 (diabetes mellitus, type 2) ([place]) 01/29/2014  . Essential hypertension 01/13/2014  . HLD (hyperlipidemia) 01/13/2014    Brianna Dodson 11/12/2019, 3:32 PM  [place] MAIN [facility] SERVICES [address] [place], [place], [address] Phone: [phone]   Fax:  [phone]   Name: Brianna Dodson MRN: [id number] Date of Birth: [birth year]

## 2019-11-14 ENCOUNTER — Telehealth: Payer: Self-pay | Admitting: Family Medicine

## 2019-11-14 NOTE — Telephone Encounter (Signed)
Pt called back stating she was told by the speech therapist she does not need to have the barium swallow done.

## 2019-11-14 NOTE — Telephone Encounter (Signed)
Left msg for pt to call ofc to sch Barium swallow

## 2019-11-14 NOTE — Telephone Encounter (Signed)
Left msg for pt to call ofc to sch.

## 2019-11-14 NOTE — Telephone Encounter (Signed)
Noted  

## 2019-11-21 ENCOUNTER — Other Ambulatory Visit: Payer: Self-pay

## 2019-11-21 ENCOUNTER — Encounter: Payer: Self-pay | Admitting: Speech Pathology

## 2019-11-21 ENCOUNTER — Ambulatory Visit: Payer: Medicare Other | Admitting: Speech Pathology

## 2019-11-21 DIAGNOSIS — R1310 Dysphagia, unspecified: Secondary | ICD-10-CM | POA: Diagnosis not present

## 2019-11-21 DIAGNOSIS — R471 Dysarthria and anarthria: Secondary | ICD-10-CM

## 2019-11-21 NOTE — Therapy (Signed)
[place] MAIN [facility] SERVICES [address] [place], [place], [address] Phone: [phone]   Fax:  [phone]  Speech Language Pathology Treatment  Patient Details  Name: Brianna Dodson MRN: [id number] Date of Birth: 1933-07-13 Referring Provider (SLP): Jennings Books   Encounter Date: 11/21/2019  End of Session - 11/21/19 1540    Visit Number  4    Number of Visits  25    Date for SLP Re-Evaluation  01/30/20    Authorization Type  Medicare    Authorization Time Period  5/7 - 01/30/2020    Authorization - Visit Number  4    Authorization - Number of Visits  25    Progress Note Due on Visit  10    SLP Start Time  1130    SLP Stop Time   1200    SLP Time Calculation (min)  30 min    Activity Tolerance  Patient tolerated treatment well       Past Medical History:  Diagnosis Date  . CKD (chronic kidney disease), stage III   . Diabetes mellitus without complication ([place])   . History of blood transfusion   . Hyperlipidemia   . Hypertension   . Stroke [facility])     Past Surgical History:  Procedure Laterality Date  . ABDOMINAL HYSTERECTOMY  1985  . LOOP RECORDER INSERTION N/A 07/30/2019   Procedure: LOOP RECORDER INSERTION;  Surgeon: Isaias Cowman, MD;  Location: [place] CV LAB;  Service: Cardiovascular;  Laterality: N/A;  . TEE WITHOUT CARDIOVERSION N/A 04/14/2019   Procedure: TRANSESOPHAGEAL ECHOCARDIOGRAM (TEE);  Surgeon: Teodoro Spray, MD;  Location: [facility] ORS;  Service: Cardiovascular;  Laterality: N/A;    There were no vitals filed for this visit.  Subjective Assessment - 11/21/19 1538    Subjective  Pt eager to participate, pleasant and conversant    Patient is accompained by:  Family member    Currently in Pain?  No/denies            ADULT SLP TREATMENT - 11/21/19 0001      General Information   Behavior/Cognition  Alert;Cooperative;Pleasant mood    Patient Positioning  Upright in chair    Oral care  provided  N/A    HPI  Pt is 86 y/o female with past medical history of CVA with residual left arm weakness, HTN, HLD, NIDDM, CKD stage IV, CVAs in September and October (2020) and latest CVA in 08/2019. Pt has some baseline mild dysarthria.       Treatment Provided   Treatment provided  Cognitive-Linquistic      Pain Assessment   Pain Assessment  No/denies pain      Cognitive-Linquistic Treatment   Treatment focused on  Dysarthria    Skilled Treatment  Skilled treatment session targeted pt's dysarthria. Pt read sentence length statements targeting different articulatory positions. Pt produced 63% of sentences with intelligible speech. Specifically, pt had difficulty with sibilants, affricates, glides, /s/ blends and /l/ blends. Will begin implementing dysarthria therapy targeting imprecise articulation.        Assessment / Recommendations / Plan   Plan  Continue with current plan of care      Progression Toward Goals   Progression toward goals  Progressing toward goals       SLP Education - 11/21/19 1539    Education Details  education provided on various ways to help dentures fit to aid in correct articulatory positions of sounds    Person(s) Educated  Patient  pt's sister   Methods  Explanation;Demonstration    Comprehension  Verbalized understanding       SLP Short Term Goals - 11/07/19 1732      SLP SHORT TERM GOAL #1   Title  Pt will improve oral motor strength and coordination by completing oral motor exercises with min SLP cues.    Baseline  Maximal assistance to complete    Time  2    Period  Weeks    Status  New    Target Date  11/21/19      SLP SHORT TERM GOAL #2   Title  Pt will improve speech intelligibility for multi-syllabic words by controlling rate of speech, over-articulation, and increased loudness to achieve 80% intelligibility with minimal SLP cues.    Baseline  Maximal Assistance    Time  2    Period  Weeks    Status  New    Target Date  11/21/19        SLP Long Term Goals - 11/07/19 1733      SLP LONG TERM GOAL #1   Title  Pt will improve oral motor strength and coordination by completing oral motor exercises independently.    Baseline  Maximal assistance    Time  12    Period  Weeks    Status  New      SLP LONG TERM GOAL #2   Title  Pt will improve speech intelligibility for connected by controlling rate of speech, over-articulation, and increased loudness to achieve 80% intelligibility.    Baseline  Maximal assistance required to achieve ~ 50% intelligibility    Time  12    Period  Weeks    Status  New    Target Date  01/30/20       Plan - 11/21/19 1541    Clinical Impression Statement  Pt's sister was present during today's session. The following information was gathered from both pt and her sister. Pt is currently wearing ill-fitting dentures. Pt reports difficulty articulating with this current pair of dentures. Her dentures before this current pair were difficult to keep in place. Recommended denture adhesive with info printed for pt to purchase at [facility].  Pt was instructed to use denture adhesive and put "old set" of dentures in with ST follow up on Monday.    Speech Therapy Frequency  2x / week    Duration  --   12 weeks   Treatment/Interventions  Functional tasks;Compensatory strategies;SLP instruction and feedback    Potential to Achieve Goals  Good    SLP Home Exercise Plan  attempt various ways to secure dentures for better speech production    Consulted and Agree with Plan of Care  Patient;Family member/caregiver    Family Member Consulted  pt's sister       Patient will benefit from skilled therapeutic intervention in order to improve the following deficits and impairments:   Dysarthria and anarthria    Problem List Patient Active Problem List   Diagnosis Date Noted  . Dysarthria and anarthria 11/07/2019  . Atrial fibrillation ([place]) 10/21/2019  . Nocturia 10/21/2019  . Seborrheic keratosis 10/21/2019  .  Carotid stenosis 08/27/2019  . Thyroid nodule 08/27/2019  . Status post placement of implantable loop recorder 08/07/2019  . Anemia 04/18/2019  . Aphasia 04/12/2019  . Benign hypertensive renal disease 04/07/2019  . Hyperparathyroidism due to renal insufficiency ([place]) 04/07/2019  . 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 Proteinuria 04/07/2019  . History of stroke with current residual effects 03/15/2019  . Positional numbness and tingling in both hands, ulnar aspect 09/19/2018  . Hyperlipidemia associated with type 2 diabetes mellitus ([place]) 07/25/2018  . Contusion of left shoulder 04/11/2018  . Itching 10/23/2017  . Nevus 04/17/2017  . CKD stage 4 due to type 2 diabetes mellitus ([place]) 02/02/2015  . Vitamin D deficiency 02/02/2015  . Obesity (BMI 30-39.9) 08/03/2014  . DM type 2 (diabetes mellitus, type 2) ([place]) 01/29/2014  . Essential hypertension 01/13/2014  . HLD (hyperlipidemia) 01/13/2014   Cyan Clippinger B. Rutherford Nail M.S., CCC-SLP, [place] Pathologist Rehabilitation Services Office [phone]  Stormy Fabian 11/21/2019, 3:43 PM  [place] MAIN [facility] SERVICES [address] [place], [place], [address] Phone: [phone]   Fax:  [phone]   Name: Brianna Dodson MRN: [id number] Date of Birth: 1933/11/28

## 2019-11-24 ENCOUNTER — Encounter: Payer: Self-pay | Admitting: Speech Pathology

## 2019-11-24 ENCOUNTER — Other Ambulatory Visit: Payer: Self-pay

## 2019-11-24 ENCOUNTER — Ambulatory Visit: Payer: Medicare Other | Admitting: Speech Pathology

## 2019-11-24 DIAGNOSIS — R471 Dysarthria and anarthria: Secondary | ICD-10-CM

## 2019-11-24 DIAGNOSIS — R1310 Dysphagia, unspecified: Secondary | ICD-10-CM | POA: Diagnosis not present

## 2019-11-24 NOTE — Therapy (Signed)
[place] MAIN [facility] SERVICES [address] [place], [place], [address] Phone: [phone]   Fax:  [phone]  Speech Language Pathology Treatment  Patient Details  Name: Brianna Dodson MRN: [id number] Date of Birth: 11-May-1934 Referring Provider (SLP): Jennings Books   Encounter Date: 11/24/2019  End of Session - 11/24/19 1115    Visit Number  5    Number of Visits  25    Date for SLP Re-Evaluation  01/30/20    Authorization Type  Medicare    Authorization Time Period  5/7 - 01/30/2020    Authorization - Visit Number  5    Authorization - Number of Visits  25    Progress Note Due on Visit  10    SLP Start Time  1000    SLP Stop Time   1047    SLP Time Calculation (min)  47 min    Activity Tolerance  Patient tolerated treatment well       Past Medical History:  Diagnosis Date  . CKD (chronic kidney disease), stage III   . Diabetes mellitus without complication ([place]rianna Dodson)   . History of blood transfusion   . Hyperlipidemia   . Hypertension   . Stroke [facility])     Past Surgical History:  Procedure Laterality Date  . ABDOMINAL HYSTERECTOMY  1985  . LOOP RECORDER INSERTION N/A 07/30/2019   Procedure: LOOP RECORDER INSERTION;  Surgeon: Isaias Cowman, MD;  Location: [place] CV LAB;  Service: Cardiovascular;  Laterality: N/A;  . TEE WITHOUT CARDIOVERSION N/A 04/14/2019   Procedure: TRANSESOPHAGEAL ECHOCARDIOGRAM (TEE);  Surgeon: Teodoro Spray, MD;  Location: [facility] ORS;  Service: Cardiovascular;  Laterality: N/A;    There were no vitals filed for this visit.  Subjective Assessment - 11/24/19 1113    Subjective  Pt eager to participate, pleasant and conversant    Patient is accompained by:  Family member    Currently in Pain?  No/denies            ADULT SLP TREATMENT - 11/24/19 0001      General Information   Behavior/Cognition  Alert;Cooperative;Pleasant mood    Patient Positioning  Upright in chair    Oral care  provided  N/A    HPI  Pt is 86 y/o female with past medical history of CVA with residual left arm weakness, HTN, HLD, NIDDM, CKD stage IV, CVAs in September and October (2020) and latest CVA in 08/2019. Pt has some baseline mild dysarthria.       Treatment Provided   Treatment provided  Cognitive-Linquistic      Pain Assessment   Pain Assessment  No/denies pain      Cognitive-Linquistic Treatment   Treatment focused on  Dysarthria    Skilled Treatment  Skilled treatment session targeted pt's speech intelligibility. Of note, pt had previous set of dentures in with adhesive. As a result, pt's articulatory precision was remarkably improved. During picture description tasks, pt's speech intelligibility was 87% and during simple conversation pt's speech intelligibility was ~ 90%. Pt able to convey complex sentences using the appropriate articulatory positions.        Assessment / Recommendations / Plan   Plan  Continue with current plan of care      Progression Toward Goals   Progression toward goals  Progressing toward goals       SLP Education - 11/24/19 1114    Education Details  need to wear comfortable fitting dentures with adhesive powder  Person(s) Educated  Patient   pt's sister   Methods  Explanation;Demonstration    Comprehension  Verbalized understanding       SLP Short Term Goals - 11/07/19 1732      SLP SHORT TERM GOAL #1   Title  Pt will improve oral motor strength and coordination by completing oral motor exercises with min SLP cues.    Baseline  Maximal assistance to complete    Time  2    Period  Weeks    Status  New    Target Date  11/21/19      SLP SHORT TERM GOAL #2   Title  Pt will improve speech intelligibility for multi-syllabic words by controlling rate of speech, over-articulation, and increased loudness to achieve 80% intelligibility with minimal SLP cues.    Baseline  Maximal Assistance    Time  2    Period  Weeks    Status  New    Target Date   11/21/19       SLP Long Term Goals - 11/07/19 1733      SLP LONG TERM GOAL #1   Title  Pt will improve oral motor strength and coordination by completing oral motor exercises independently.    Baseline  Maximal assistance    Time  12    Period  Weeks    Status  New      SLP LONG TERM GOAL #2   Title  Pt will improve speech intelligibility for connected by controlling rate of speech, over-articulation, and increased loudness to achieve 80% intelligibility.    Baseline  Maximal assistance required to achieve ~ 50% intelligibility    Time  12    Period  Weeks    Status  New    Target Date  01/30/20       Plan - 11/24/19 1116    Clinical Impression Statement  Pt's speech is much improved with use of her most comfortable pair dentures. Given the increased comfort with denture adhesive, pt's speech is more precise. Pt's sister was present during session and reports that pt's speech "sounds more like herself." Will continue to follow for an additional session to assess for any communication breakdowns within home environment.    Speech Therapy Frequency  2x / week    Duration  --   12 weeks   Treatment/Interventions  Functional tasks;Compensatory strategies;SLP instruction and feedback    Potential to Achieve Goals  Good    SLP Home Exercise Plan  report on listener understanding with phone call to PCP's office    Consulted and Agree with Plan of Care  Patient;Family member/caregiver    Family Member Consulted  pt's sister       Patient will benefit from skilled therapeutic intervention in order to improve the following deficits and impairments:   Dysarthria and anarthria    Problem List Patient Active Problem List   Diagnosis Date Noted  . Dysarthria and anarthria 11/07/2019  . Atrial fibrillation ([place]) 10/21/2019  . Nocturia 10/21/2019  . Seborrheic keratosis 10/21/2019  . Carotid stenosis 08/27/2019  . Thyroid nodule 08/27/2019  . Status post placement of implantable  loop recorder 08/07/2019  . Anemia 04/18/2019  . Aphasia 04/12/2019  . Benign hypertensive renal disease 04/07/2019  . Hyperparathyroidism due to renal insufficiency ([place]) 04/07/2019  . 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 Proteinuria 04/07/2019  . History of stroke with current residual effects 03/15/2019  .  Positional numbness and tingling in both hands, ulnar aspect 09/19/2018  . Hyperlipidemia associated with type 2 diabetes mellitus ([place]) 07/25/2018  . Contusion of left shoulder 04/11/2018  . Itching 10/23/2017  . Nevus 04/17/2017  . CKD stage 4 due to type 2 diabetes mellitus ([place]) 02/02/2015  . Vitamin D deficiency 02/02/2015  . Obesity (BMI 30-39.9) 08/03/2014  . DM type 2 (diabetes mellitus, type 2) ([place]) 01/29/2014  . Essential hypertension 01/13/2014  . HLD (hyperlipidemia) 01/13/2014   Freida Nebel B. Rutherford Nail M.S., CCC-SLP, [place] Pathologist Rehabilitation Services Office [phone]  Stormy Fabian 11/24/2019, 11:18 AM  [place] MAIN [facility] SERVICES [address] [place], [place], [address] Phone: [phone]   Fax:  [phone]   Name: Maple Odaniel MRN: [id number] Date of Birth: 11/24/1933

## 2019-11-26 ENCOUNTER — Other Ambulatory Visit: Payer: Self-pay

## 2019-11-26 ENCOUNTER — Encounter: Payer: Self-pay | Admitting: Family Medicine

## 2019-11-26 ENCOUNTER — Ambulatory Visit: Payer: Medicare Other | Admitting: Speech Pathology

## 2019-11-26 ENCOUNTER — Ambulatory Visit (INDEPENDENT_AMBULATORY_CARE_PROVIDER_SITE_OTHER): Payer: Medicare Other | Admitting: Family Medicine

## 2019-11-26 ENCOUNTER — Encounter: Payer: Self-pay | Admitting: Speech Pathology

## 2019-11-26 DIAGNOSIS — I639 Cerebral infarction, unspecified: Secondary | ICD-10-CM

## 2019-11-26 DIAGNOSIS — I10 Essential (primary) hypertension: Secondary | ICD-10-CM | POA: Diagnosis not present

## 2019-11-26 DIAGNOSIS — R471 Dysarthria and anarthria: Secondary | ICD-10-CM

## 2019-11-26 DIAGNOSIS — R600 Localized edema: Secondary | ICD-10-CM | POA: Diagnosis not present

## 2019-11-26 DIAGNOSIS — R1310 Dysphagia, unspecified: Secondary | ICD-10-CM | POA: Diagnosis not present

## 2019-11-26 DIAGNOSIS — I6930 Unspecified sequelae of cerebral infarction: Secondary | ICD-10-CM

## 2019-11-26 NOTE — Therapy (Signed)
[place] MAIN [facility] SERVICES [address] [place], [place], [address] Phone: [phone]   Fax:  [phone]  Speech Language Pathology Treatment And DISCHARGE SUMMARY  Patient Details  Name: Brianna Dodson MRN: [id number] Date of Birth: 22-Sep-1933 Referring Provider (SLP): Jennings Books   Encounter Date: 11/26/2019  End of Session - 11/26/19 1354    Visit Number  6    Number of Visits  6    Date for SLP Re-Evaluation  01/30/20    Authorization Type  Medicare    Authorization Time Period  5/7 - 01/30/2020    Authorization - Visit Number  6    Authorization - Number of Visits  25    Progress Note Due on Visit  10    SLP Start Time  1000    SLP Stop Time   1045    SLP Time Calculation (min)  45 min    Activity Tolerance  Patient tolerated treatment well       Past Medical History:  Diagnosis Date  . CKD (chronic kidney disease), stage III   . Diabetes mellitus without complication ([place])   . History of blood transfusion   . Hyperlipidemia   . Hypertension   . Stroke [facility])     Past Surgical History:  Procedure Laterality Date  . ABDOMINAL HYSTERECTOMY  1985  . LOOP RECORDER INSERTION N/A 07/30/2019   Procedure: LOOP RECORDER INSERTION;  Surgeon: Isaias Cowman, MD;  Location: [place] CV LAB;  Service: Cardiovascular;  Laterality: N/A;  . TEE WITHOUT CARDIOVERSION N/A 04/14/2019   Procedure: TRANSESOPHAGEAL ECHOCARDIOGRAM (TEE);  Surgeon: Teodoro Spray, MD;  Location: [facility] ORS;  Service: Cardiovascular;  Laterality: N/A;    There were no vitals filed for this visit.  Subjective Assessment - 11/26/19 1354    Subjective  Pt eager to participate, pleasant and conversant    Patient is accompained by:  --   personal caregiver   Currently in Pain?  No/denies       ADULT SLP TREATMENT - 11/26/19 0001      General Information   Behavior/Cognition  Alert;Cooperative;Pleasant mood    Patient Positioning  Upright in chair     Oral care provided  N/A    HPI  Pt is 86 y/o female with past medical history of CVA with residual left arm weakness, HTN, HLD, NIDDM, CKD stage IV, CVAs in September and October (2020) and latest CVA in 08/2019. Pt has some baseline mild dysarthria.       Treatment Provided   Treatment provided  Cognitive-Linquistic      Pain Assessment   Pain Assessment  No/denies pain      Cognitive-Linquistic Treatment   Treatment focused on  Dysarthria    Skilled Treatment  Skilled treatment session targeted pt's speech intelligibility. Pt appeared distraught over some personal family dynamics and states that she didn't get her "dentures in correctly this morning." Pt didn't have upper dentures in place.  Despite this, pt was able to convey complex abstract unknown information to listener with > 95% speech intelligibility. Pt's caregiver to help pt apply powder adhesive and help with her dentures. Despite not having dentures in place, pt continues to demonstrate great intelligibility.       Assessment / Recommendations / Plan   Plan  Continue with current plan of care;All goals met      Progression Toward Goals   Progression toward goals  Goals met, education completed, patient discharged from SLP  SLP Education - 11/26/19 1354    Education Details  education completed on speech intelligibility    Person(s) Educated  Patient;Caregiver(s)    Methods  Explanation;Demonstration    Comprehension  Verbalized understanding;Returned demonstration       SLP Short Term Goals - 11/26/19 1359      SLP SHORT TERM GOAL #1   Status  Achieved      SLP SHORT TERM GOAL #2   Status  Achieved      SLP SHORT TERM GOAL #3   Status  Achieved       SLP Long Term Goals - 11/26/19 1359      SLP LONG TERM GOAL #1   Status  Achieved      SLP LONG TERM GOAL #2   Status  Achieved       Plan - 11/26/19 1355    Clinical Impression Statement  Pt presents with much improved speech intelligibility and  when conveying complex abstract conversational topics,, her speech is > 95% intelligible. Pt can recall and demonstrate knowledge of speech intelligibility strategies. Education has been ongoing and all questions have been answered to her satisfaction.    Speech Therapy Frequency  --   Discharged 11/26/2019   Duration  --   Discharged 11/26/2019   Potential to Achieve Goals  Good    SLP Home Exercise Plan  continue to wear dentures that fill    Consulted and Agree with Plan of Care  Patient;Other (Comment)   personal caregiver      Patient will benefit from skilled therapeutic intervention in order to improve the following deficits and impairments:   Dysarthria and anarthria    Problem List Patient Active Problem List   Diagnosis Date Noted  . Pedal edema 11/26/2019  . Dysarthria and anarthria 11/07/2019  . Atrial fibrillation ([place]) 10/21/2019  . Nocturia 10/21/2019  . Seborrheic keratosis 10/21/2019  . Carotid stenosis 08/27/2019  . Thyroid nodule 08/27/2019  . Status post placement of implantable loop recorder 08/07/2019  . Anemia 04/18/2019  . Aphasia 04/12/2019  . Benign hypertensive renal disease 04/07/2019  . Hyperparathyroidism due to renal insufficiency ([place]) 04/07/2019  . 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 Proteinuria 04/07/2019  . History of stroke with current residual effects 03/15/2019  . Positional numbness and tingling in both hands, ulnar aspect 09/19/2018  . Hyperlipidemia associated with type 2 diabetes mellitus ([place]) 07/25/2018  . Contusion of left shoulder 04/11/2018  . Itching 10/23/2017  . Nevus 04/17/2017  . CKD stage 4 due to type 2 diabetes mellitus ([place]) 02/02/2015  . Vitamin D deficiency 02/02/2015  . Obesity (BMI 30-39.9) 08/03/2014  . DM type 2 (diabetes mellitus, type 2) ([place]) 01/29/2014  . Essential hypertension 01/13/2014  . HLD (hyperlipidemia) 01/13/2014    Ellianah Cordy B. Rutherford Nail M.S., CCC-SLP, [place] Pathologist Rehabilitation Services Office [phone]   Stormy Fabian 11/26/2019, 2:01 PM  [place] MAIN [facility] SERVICES [address] [place], [place], [address] Phone: [phone]   Fax:  [phone]   Name: Brianna Dodson MRN: [id number] Date of Birth: [birth year]

## 2019-11-26 NOTE — Patient Instructions (Signed)
Nice to see you. We will check lab work today and contact you with the results. 

## 2019-11-26 NOTE — Assessment & Plan Note (Signed)
speech has been stable.  She is no longer seeing speech therapy.  She will monitor for any recurrent symptoms.  She will seek medical attention if those occur.  She will continue Crestor and Eliquis.

## 2019-11-26 NOTE — Assessment & Plan Note (Signed)
I suspect this is related to her amlodipine.  We will check lab work to rule out other underlying causes.  We will then consider changing her amlodipine to an alternative medication.

## 2019-11-26 NOTE — Progress Notes (Signed)
  [name], MD Phone: [phone]  Brianna Dodson is a 86 y.o. female who presents today for f/u.  HYPERTENSION  Disease Monitoring  Home BP Monitoring not checking Chest pain- no    Dyspnea- no Medications  Compliance-  Taking amlodipine, lisinopril.  Edema- yes, bilateral feet for the last 2 to 3 days.  Has had some discomfort in the bottom of her feet related to this.  No orthopnea or PND.  History of stroke: on eliquis and crestor.  She notes her speech is stable.  Her speech is not smooth.  She notes no new stroke symptoms.  She notes speech therapy has discharged her from their care as she has progressed as far she can.  Social History   Tobacco Use  Smoking Status Never Smoker  Smokeless Tobacco Never Used     ROS see history of present illness  Objective  Physical Exam Vitals:   11/26/19 1347  BP: 120/70  Pulse: 75  Temp: (!) 96.8 F (36 C)  SpO2: 96%    BP Readings from Last 3 Encounters:  11/26/19 120/70  10/21/19 121/70  09/09/19 119/61   Wt Readings from Last 3 Encounters:  11/26/19 179 lb 3.2 oz (81.3 kg)  10/21/19 178 lb 6.4 oz (80.9 kg)  09/09/19 176 lb (79.8 kg)    Physical Exam Constitutional:      General: She is not in acute distress.    Appearance: She is not diaphoretic.  Cardiovascular:     Rate and Rhythm: Normal rate and regular rhythm.     Heart sounds: Normal heart sounds.  Pulmonary:     Effort: Pulmonary effort is normal.     Breath sounds: Normal breath sounds.  Musculoskeletal:     Comments: 1+ pitting edema in her bilateral feet, no edema proximal to her feet  Skin:    General: Skin is warm and dry.  Neurological:     Mental Status: She is alert.      Assessment/Plan: Please see individual problem list.  Essential hypertension Adequate control.  She will continue her current medication.  We will check labs to evaluate for underlying causes of swelling and consider changing her amlodipine to an alternative  medication.  History of stroke with current residual effects  speech has been stable.  She is no longer seeing speech therapy.  She will monitor for any recurrent symptoms.  She will seek medical attention if those occur.  She will continue Crestor and Eliquis.  Pedal edema I suspect this is related to her amlodipine.  We will check lab work to rule out other underlying causes.  We will then consider changing her amlodipine to an alternative medication.   Orders Placed This Encounter  Procedures  . Comp Met (CMET)  . TSH  . CBC    No orders of the defined types were placed in this encounter.   This visit occurred during the SARS-CoV-2 public health emergency.  Safety protocols were in place, including screening questions prior to the visit, additional usage of staff PPE, and extensive cleaning of exam room while observing appropriate contact time as indicated for disinfecting solutions.    [name], MD [place]

## 2019-11-26 NOTE — Assessment & Plan Note (Addendum)
Adequate control.  She will continue her current medication.  We will check labs to evaluate for underlying causes of swelling and consider changing her amlodipine to an alternative medication.

## 2019-11-27 LAB — CBC
HCT: 27.1 % — ABNORMAL LOW (ref 36.0–46.0)
Hemoglobin: 9.3 g/dL — ABNORMAL LOW (ref 12.0–15.0)
MCHC: 34.5 g/dL (ref 30.0–36.0)
MCV: 85.1 fl (ref 78.0–100.0)
Platelets: 150 10*3/uL (ref 150.0–400.0)
RBC: 3.18 Mil/uL — ABNORMAL LOW (ref 3.87–5.11)
RDW: 13.9 % (ref 11.5–15.5)
WBC: 3.4 10*3/uL — ABNORMAL LOW (ref 4.0–10.5)

## 2019-11-27 LAB — COMPREHENSIVE METABOLIC PANEL
ALT: 15 U/L (ref 0–35)
AST: 18 U/L (ref 0–37)
Albumin: 4.1 g/dL (ref 3.5–5.2)
Alkaline Phosphatase: 46 U/L (ref 39–117)
BUN: 32 mg/dL — ABNORMAL HIGH (ref 6–23)
CO2: 25 mEq/L (ref 19–32)
Calcium: 8.7 mg/dL (ref 8.4–10.5)
Chloride: 105 mEq/L (ref 96–112)
Creatinine, Ser: 2.22 mg/dL — ABNORMAL HIGH (ref 0.40–1.20)
GFR: 25.45 mL/min — ABNORMAL LOW (ref >60.00)
Glucose, Bld: 216 mg/dL — ABNORMAL HIGH (ref 70–99)
Potassium: 4.8 mEq/L (ref 3.5–5.1)
Sodium: 137 mEq/L (ref 135–145)
Total Bilirubin: 0.4 mg/dL (ref 0.2–1.2)
Total Protein: 6.8 g/dL (ref 6.0–8.3)

## 2019-11-27 LAB — TSH: TSH: 1.9 u[IU]/mL (ref 0.35–4.50)

## 2019-12-02 ENCOUNTER — Telehealth: Payer: Self-pay | Admitting: Family Medicine

## 2019-12-02 ENCOUNTER — Other Ambulatory Visit: Payer: Self-pay | Admitting: Family Medicine

## 2019-12-02 ENCOUNTER — Telehealth: Payer: Self-pay | Admitting: *Deleted

## 2019-12-02 DIAGNOSIS — D72819 Decreased white blood cell count, unspecified: Secondary | ICD-10-CM

## 2019-12-02 MED ORDER — CARVEDILOL 6.25 MG PO TABS
6.2500 mg | ORAL_TABLET | Freq: Two times a day (BID) | ORAL | 3 refills | Status: DC
Start: 2019-12-02 — End: 2020-03-01

## 2019-12-02 NOTE — Telephone Encounter (Signed)
Coreg sent to pharmacy for her blood pressure. She should stop the amlodipine and start the coreg the day after her last dose of amlodipine. She needs follow-up with me in the office in 1 week for a BP and pulse check and to see if her swelling has improved. She needs to take her glimeperide as prescribed.

## 2019-12-02 NOTE — Telephone Encounter (Signed)
I called and spoke with the patient and informed her that she is to stop the amlodipine because it is causing her to swell and to start a new medication called Coreg that the provider sent to her pharmacy.  I informed her that she is to take her glimepiride as directed and she is scheduled to see the provider in 1 week for a bp and pulse check.  Annlouise Gerety,cma

## 2019-12-02 NOTE — Telephone Encounter (Signed)
Ordered

## 2019-12-02 NOTE — Telephone Encounter (Signed)
Pt feet are swelling and she was told her white count was low and that she was going to start a new medication. Pt stated that she hasn't took her glimepiride in two days. her blood sugar is 150 now

## 2019-12-02 NOTE — Telephone Encounter (Signed)
Please place future orders for lab appt.  

## 2019-12-03 ENCOUNTER — Ambulatory Visit: Payer: Medicare Other | Admitting: Speech Pathology

## 2019-12-03 ENCOUNTER — Other Ambulatory Visit: Payer: Medicare Other

## 2019-12-04 ENCOUNTER — Other Ambulatory Visit: Payer: Self-pay

## 2019-12-04 ENCOUNTER — Other Ambulatory Visit (INDEPENDENT_AMBULATORY_CARE_PROVIDER_SITE_OTHER): Payer: Medicare Other

## 2019-12-04 DIAGNOSIS — D72819 Decreased white blood cell count, unspecified: Secondary | ICD-10-CM | POA: Diagnosis not present

## 2019-12-04 LAB — CBC WITH DIFFERENTIAL/PLATELET
Basophils Absolute: 0 10*3/uL (ref 0.0–0.1)
Basophils Relative: 0.8 % (ref 0.0–3.0)
Eosinophils Absolute: 0.1 10*3/uL (ref 0.0–0.7)
Eosinophils Relative: 2.8 % (ref 0.0–5.0)
HCT: 28.7 % — ABNORMAL LOW (ref 36.0–46.0)
Hemoglobin: 9.9 g/dL — ABNORMAL LOW (ref 12.0–15.0)
Lymphocytes Relative: 26.2 % (ref 12.0–46.0)
Lymphs Abs: 1 10*3/uL (ref 0.7–4.0)
MCHC: 34.5 g/dL (ref 30.0–36.0)
MCV: 85.3 fl (ref 78.0–100.0)
Monocytes Absolute: 0.4 10*3/uL (ref 0.1–1.0)
Monocytes Relative: 9.7 % (ref 3.0–12.0)
Neutro Abs: 2.4 10*3/uL (ref 1.4–7.7)
Neutrophils Relative %: 60.5 % (ref 43.0–77.0)
Platelets: 146 10*3/uL — ABNORMAL LOW (ref 150.0–400.0)
RBC: 3.37 Mil/uL — ABNORMAL LOW (ref 3.87–5.11)
RDW: 13.7 % (ref 11.5–15.5)
WBC: 4 10*3/uL (ref 4.0–10.5)

## 2019-12-05 ENCOUNTER — Encounter: Payer: Medicare Other | Admitting: Speech Pathology

## 2019-12-08 ENCOUNTER — Ambulatory Visit (INDEPENDENT_AMBULATORY_CARE_PROVIDER_SITE_OTHER): Payer: Medicare Other | Admitting: Family Medicine

## 2019-12-08 ENCOUNTER — Encounter: Payer: Self-pay | Admitting: Family Medicine

## 2019-12-08 ENCOUNTER — Other Ambulatory Visit: Payer: Self-pay

## 2019-12-08 ENCOUNTER — Encounter: Payer: Medicare Other | Admitting: Speech Pathology

## 2019-12-08 DIAGNOSIS — R600 Localized edema: Secondary | ICD-10-CM | POA: Diagnosis not present

## 2019-12-08 DIAGNOSIS — D72819 Decreased white blood cell count, unspecified: Secondary | ICD-10-CM | POA: Diagnosis not present

## 2019-12-08 DIAGNOSIS — T148XXA Other injury of unspecified body region, initial encounter: Secondary | ICD-10-CM

## 2019-12-08 DIAGNOSIS — I639 Cerebral infarction, unspecified: Secondary | ICD-10-CM

## 2019-12-08 DIAGNOSIS — D649 Anemia, unspecified: Secondary | ICD-10-CM | POA: Diagnosis not present

## 2019-12-08 DIAGNOSIS — L821 Other seborrheic keratosis: Secondary | ICD-10-CM

## 2019-12-08 DIAGNOSIS — I10 Essential (primary) hypertension: Secondary | ICD-10-CM

## 2019-12-08 NOTE — Assessment & Plan Note (Signed)
Likely related to her amlodipine. Discuss that this is not likely related to her eliquis. She will monitor for improvement now that she is off her amlodipine.

## 2019-12-08 NOTE — Assessment & Plan Note (Signed)
Resolved on recheck. Discussed that we will periodically monitor this.

## 2019-12-08 NOTE — Progress Notes (Signed)
Brianna Rumps, MD Phone: [phone]  Brianna Dodson is a 86 y.o. female who presents today for f/u.  HYPERTENSION  Disease Monitoring  Home BP Monitoring not checking Chest pain- no    Dyspnea- no Medications  Compliance-  Taking lisinopril, she has not picked up the coreg yet  Edema- still some in her feet, she stopped the amlodipine 3 days ago. She wonders if the eliquis is contributing to the swelling.   Anemia: patient wondered what was causing this. She also had a low WBC on recent testing though this returned to the acceptable range on retesting. Prior ferritin acceptable.   Side discomfort: Patient notes occasional discomfort in the muscles in her left side if she moves wrong in bed.  Does not hurt at other times.  No pain currently.  Seborrheic keratosis: Left side near her axilla.  Does not get irritated.    Social History   Tobacco Use  Smoking Status Never Smoker  Smokeless Tobacco Never Used     ROS see history of present illness  Objective  Physical Exam Vitals:   12/08/19 0949 12/08/19 0954  BP: (!) 160/80 (!) 150/70  Pulse: 72   Temp: (!) 97.3 F (36.3 C)   SpO2: 99%     BP Readings from Last 3 Encounters:  12/08/19 (!) 150/70  11/26/19 120/70  10/21/19 121/70   Wt Readings from Last 3 Encounters:  12/08/19 176 lb (79.8 kg)  11/26/19 179 lb 3.2 oz (81.3 kg)  10/21/19 178 lb 6.4 oz (80.9 kg)    Physical Exam Constitutional:      General: She is not in acute distress.    Appearance: She is not diaphoretic.  Cardiovascular:     Rate and Rhythm: Normal rate and regular rhythm.     Heart sounds: Normal heart sounds.  Pulmonary:     Effort: Pulmonary effort is normal.     Breath sounds: Normal breath sounds.  Musculoskeletal:     Comments: Pedal edema bilaterally, does not extend above her ankles, no midline spine tenderness, no midline spine step-off, no muscular back or flank tenderness  Skin:    General: Skin is warm and dry.   Comments: Small half a centimeter sized seborrheic keratosis near her axilla on the left, benign-appearing  Neurological:     Mental Status: She is alert.      Assessment/Plan: Please see individual problem list.  Essential hypertension Elevated today though has not started the coreg. She will start on this and continue the lisinopril. She will monitor her edema as she has only been off the amlodipine for a few days. If not improving she will let us know.   Anemia Chronic issue. Likely related to her CKD. Has been stable. We will periodically monitor.   Leukopenia Resolved on recheck. Discussed that we will periodically monitor this.   Pedal edema Likely related to her amlodipine. Discuss that this is not likely related to her eliquis. She will monitor for improvement now that she is off her amlodipine.   Seborrheic keratosis Consistent with a seborrheic keratosis.  Discussed benign nature.  She will monitor.  Muscle strain Suspect muscle strain over her left flank.  She will monitor.  No orders of the defined types were placed in this encounter.   No orders of the defined types were placed in this encounter.   This visit occurred during the SARS-CoV-2 public health emergency.  Safety protocols were in place, including screening questions prior to the visit, additional usage of  staff PPE, and extensive cleaning of exam room while observing appropriate contact time as indicated for disinfecting solutions.    Brianna Rumps, MD [place]

## 2019-12-08 NOTE — Assessment & Plan Note (Signed)
Chronic issue. Likely related to her CKD. Has been stable. We will periodically monitor.

## 2019-12-08 NOTE — Patient Instructions (Signed)
Nice to see you.  Please monitor your swelling and if it does not improve off the amlodipine please let us know.  Please start the carvedilol for your BP.

## 2019-12-08 NOTE — Assessment & Plan Note (Signed)
Suspect muscle strain over her left flank.  She will monitor.

## 2019-12-08 NOTE — Assessment & Plan Note (Signed)
Elevated today though has not started the coreg. She will start on this and continue the lisinopril. She will monitor her edema as she has only been off the amlodipine for a few days. If not improving she will let us know.

## 2019-12-08 NOTE — Assessment & Plan Note (Signed)
Consistent with a seborrheic keratosis.  Discussed benign nature.  She will monitor.

## 2019-12-10 ENCOUNTER — Ambulatory Visit (INDEPENDENT_AMBULATORY_CARE_PROVIDER_SITE_OTHER): Payer: Medicare Other

## 2019-12-10 ENCOUNTER — Other Ambulatory Visit: Payer: Self-pay

## 2019-12-10 ENCOUNTER — Ambulatory Visit (INDEPENDENT_AMBULATORY_CARE_PROVIDER_SITE_OTHER): Payer: Medicare Other | Admitting: Nurse Practitioner

## 2019-12-10 ENCOUNTER — Encounter (INDEPENDENT_AMBULATORY_CARE_PROVIDER_SITE_OTHER): Payer: Self-pay | Admitting: Nurse Practitioner

## 2019-12-10 VITALS — BP 194/79 | HR 57 | Ht 65.0 in | Wt 173.0 lb

## 2019-12-10 DIAGNOSIS — I10 Essential (primary) hypertension: Secondary | ICD-10-CM | POA: Diagnosis not present

## 2019-12-10 DIAGNOSIS — I6522 Occlusion and stenosis of left carotid artery: Secondary | ICD-10-CM | POA: Diagnosis not present

## 2019-12-10 DIAGNOSIS — I480 Paroxysmal atrial fibrillation: Secondary | ICD-10-CM

## 2019-12-10 DIAGNOSIS — R4701 Aphasia: Secondary | ICD-10-CM | POA: Diagnosis not present

## 2019-12-10 NOTE — Progress Notes (Signed)
Subjective:    [name], female    DOB: 03-04-1934, 86 y.o.   MRN: [id number] Chief Complaint  Patient presents with  . Follow-up    u/S Follow up    Brianna Dodson is a 86 y.o. female.  The patient returns today for follow-up invasive studies after a stroke that left her with some expressive aphasia.  The patient denies any residual arm or leg weakness.  She denies any swallowing difficulty.  The patient has also recently had some recent medication changes including hypertensive medications in addition to adding Eliquis.  It is felt that her stroke may be related to a thromboembolic process versus carotid stenosis.  Initially, carotid duplex found that her left internal carotid artery stenosis was in the 50 to 69% range.  Today however, the bilateral internal carotid arteries were found to be within the 1 to 39% range.  Currently the patient denies any fever, chills, nausea, vomiting or diarrhea.      Review of Systems  Cardiovascular: Positive for leg swelling.  Neurological: Positive for speech difficulty.  All other systems reviewed and are negative.      Objective:   Physical Exam Vitals reviewed.  HENT:     Head: Normocephalic.  Neck:     Vascular: No carotid bruit.  Cardiovascular:     Rate and Rhythm: Normal rate and regular rhythm.     Pulses: Normal pulses.     Heart sounds: Normal heart sounds.  Pulmonary:     Effort: Pulmonary effort is normal.     Breath sounds: Normal breath sounds.  Musculoskeletal:     Right lower leg: 1+ Edema present.     Left lower leg: 1+ Edema present.  Neurological:     Mental Status: She is alert and oriented to person, place, and time. Mental status is at baseline.     GCS: GCS eye subscore is 4. GCS verbal subscore is 5. GCS motor subscore is 6.     Motor: Motor function is intact.     Comments: Expressive aphasia  Psychiatric:        Mood and Affect: Mood normal.        Behavior: Behavior normal.        Thought  Content: Thought content normal.        Judgment: Judgment normal.     BP (!) 194/79   Pulse (!) 57   Ht 5' 5"  (1.651 m)   Wt 173 lb (78.5 kg)   BMI 28.79 kg/m   Past Medical History:  Diagnosis Date  . CKD (chronic kidney disease), stage III   . Diabetes mellitus without complication ([place])   . History of blood transfusion   . Hyperlipidemia   . Hypertension   . Stroke [facility])     Social History   Socioeconomic History  . Marital status: Married    Spouse name: Not on file  . Number of children: 2  . Years of education: Not on file  . Highest education level: Not on file  Occupational History  . 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 Retired  Tobacco Use  . Smoking status: Never Smoker  . Smokeless tobacco: Never Used  Substance and Sexual Activity  . Alcohol use: No  . Drug use: No  . Sexual activity: Not Currently  Other Topics Concern  . Not on file  Social History Narrative   She is married and has two adult children (adopted).  Hobbies: Decorating   Exercise: None   Caffeine: 4 cups a week   Social Determinants of Health   Financial Resource Strain: Unknown  . Difficulty of Paying Living Expenses: Patient refused  Food Insecurity: Unknown  . Worried About Charity fundraiser in the Last Year: Patient refused  . Ran Out of Food in the Last Year: Patient refused  Transportation Needs: Unknown  . Lack of Transportation (Medical): Patient refused  . Lack of Transportation (Non-Medical): Patient refused  Physical Activity: Unknown  . Days of Exercise per Week: Patient refused  . Minutes of Exercise per Session: Patient refused  Stress: Unknown  . Feeling of Stress : Patient refused  Social Connections: Unknown  . Frequency of Communication with Friends and Family: Patient refused  . Frequency of Social Gatherings with Friends and Family: Patient refused  . Attends Religious Services: Patient refused  . Active Member of Clubs or  Organizations: Patient refused  . Attends Archivist Meetings: Patient refused  . Marital Status: Patient refused  Intimate Partner Violence: Unknown  . Fear of Current or Ex-Partner: Patient refused  . Emotionally Abused: Patient refused  . Physically Abused: Patient refused  . Sexually Abused: Patient refused    Past Surgical History:  Procedure Laterality Date  . ABDOMINAL HYSTERECTOMY  1985  . LOOP RECORDER INSERTION N/A 07/30/2019   Procedure: LOOP RECORDER INSERTION;  Surgeon: Isaias Cowman, MD;  Location: [place] CV LAB;  Service: Cardiovascular;  Laterality: N/A;  . TEE WITHOUT CARDIOVERSION N/A 04/14/2019   Procedure: TRANSESOPHAGEAL ECHOCARDIOGRAM (TEE);  Surgeon: Teodoro Spray, MD;  Location: [facility] ORS;  Service: Cardiovascular;  Laterality: N/A;    Family History  Problem Relation Age of Onset  . Stroke Mother   . Diabetes Mother   . Heart disease Father   . Kidney disease Sister   . Diabetes Sister   . Kidney disease Brother   . Heart disease Sister   . Diabetes Sister   . Diabetes Sister   . Diabetes Sister   . Kidney disease Sister   . Heart disease Sister   . Kidney disease Brother        kidney transplant  . Early death Brother 75       Truck Accident - died  . Heart disease Brother     Allergies  Allergen Reactions  . Nsaids     CKD stage III - Avoid all nephrotoxic drugs       Assessment & Plan:   1. Stenosis of left carotid artery Today the noninvasive studies show greatly decreased velocities in the left internal carotid artery, versus the previous studies.  It is possible that this is due to the fact that the previous studies were done within a tortuous section of the internal carotid artery.  Today the patient's carotid artery stenosis is 1 to 39% bilaterally.  Based on this we will hold off on ordering any CT scanning continue with noninvasive studies.  The patient will return in 6 months with a carotid artery duplex, or  sooner if issues should arise.  2. Essential hypertension Continue antihypertensive medications as already ordered, these medications have been reviewed and there are no changes at this time.  Patient had recent medication changes may account for her hypertension today.   3. Paroxysmal atrial fibrillation [facility]) Patient recently started on Eliquis as it is felt that her recent stroke may have been caused by the thromboembolic process.  Patient also has recently implant recorder.  She will continue to follow with PCP and cardiology.  4. Aphasia Patient is improving.  Patient is still working on speech but is getting thoughts and ideas across effectively.  Current Outpatient Medications on File Prior to Visit  Medication Sig Dispense Refill  . aspirin 81 MG tablet Take 81 mg by mouth daily at 12 noon.     . carvedilol (COREG) 6.25 MG tablet Take 1 tablet (6.25 mg total) by mouth 2 (two) times daily with a meal. 60 tablet 3  . ELIQUIS 2.5 MG TABS tablet SMARTSIG:1 Tablet(s) By Mouth Every 12 Hours    . ezetimibe (ZETIA) 10 MG tablet Take 1 tablet (10 mg total) by mouth daily. 90 tablet 3  . Ferrous Sulfate (IRON) 28 MG TABS Take 28 mg by mouth daily at 12 noon.    Marland Kitchen glimepiride (AMARYL) 1 MG tablet TAKE THREE TABLETS BY MOUTH DAILY 270 tablet 0  . Lancets (ONETOUCH ULTRASOFT) lancets Use as instructed 100 each 6  . rosuvastatin (CRESTOR) 40 MG tablet TAKE ONE TABLET BY MOUTH DAILY 90 tablet 0  . Vitamin D, Ergocalciferol, (DRISDOL) 50000 units CAPS capsule Take 50,000 Units by mouth every 30 (thirty) days.  1  . ACCU-CHEK GUIDE test strip USE TO CHECK BLOOD SUGARS TWICE DAILY. E11.9 100 each 0  . blood glucose meter kit and supplies KIT Dispense based on patient and insurance preference. Check CBGs two times daily. E11.9. 1 each 0  . blood glucose meter kit and supplies Dispense based on patient and insurance preference. Use up to four times daily as directed. E11.9 1 each 0  . lisinopril  (ZESTRIL) 10 MG tablet Take 1 tablet (10 mg total) by mouth daily. (Patient not taking: Reported on 12/10/2019) 90 tablet 1   No current facility-administered medications on file prior to visit.    There are no Patient Instructions on file for this visit. No follow-ups on file.   Kris Hartmann, NP

## 2019-12-11 ENCOUNTER — Telehealth: Payer: Self-pay | Admitting: Family Medicine

## 2019-12-11 NOTE — Telephone Encounter (Signed)
Camilla (Caregiver) called and states pt's BP is 169/68. States no symptoms. Please advise. [phone]

## 2019-12-11 NOTE — Telephone Encounter (Signed)
I called and spoke with the patients sister and asked about the elevated BP and she stated that the patient started the new medication coreg on Monday  but when she checked her pill box she had not been taken that second dose she only was taking 1 pill per day. Since she discovered that she has been on her about the medication and making sure she takes it twice daily.  Also she is concerned about the BP monitor she has she is not sure it is accurate.  I informed her that when the patient comes in at her next appointment to bring the BP monitor with them so we can compare results.  In the meantime I informed her to monitor her BP after taking the medication correctly and letting is know how she is doing.  She understood.  Glen Kesinger,cma

## 2019-12-12 ENCOUNTER — Encounter: Payer: Medicare Other | Admitting: Speech Pathology

## 2019-12-17 ENCOUNTER — Encounter: Payer: Medicare Other | Admitting: Speech Pathology

## 2019-12-19 ENCOUNTER — Encounter: Payer: Medicare Other | Admitting: Speech Pathology

## 2019-12-22 ENCOUNTER — Encounter: Payer: Medicare Other | Admitting: Speech Pathology

## 2019-12-23 ENCOUNTER — Other Ambulatory Visit: Payer: Self-pay | Admitting: Family Medicine

## 2019-12-24 ENCOUNTER — Encounter: Payer: Medicare Other | Admitting: Speech Pathology

## 2019-12-29 ENCOUNTER — Encounter: Payer: Medicare Other | Admitting: Speech Pathology

## 2019-12-31 ENCOUNTER — Encounter: Payer: Medicare Other | Admitting: Speech Pathology

## 2020-01-01 DIAGNOSIS — N183 Chronic kidney disease, stage 3 unspecified: Secondary | ICD-10-CM | POA: Diagnosis not present

## 2020-01-01 DIAGNOSIS — Z8673 Personal history of transient ischemic attack (TIA), and cerebral infarction without residual deficits: Secondary | ICD-10-CM | POA: Diagnosis not present

## 2020-01-01 DIAGNOSIS — G939 Disorder of brain, unspecified: Secondary | ICD-10-CM | POA: Diagnosis not present

## 2020-01-01 DIAGNOSIS — R4701 Aphasia: Secondary | ICD-10-CM | POA: Diagnosis not present

## 2020-01-01 DIAGNOSIS — E1122 Type 2 diabetes mellitus with diabetic chronic kidney disease: Secondary | ICD-10-CM | POA: Diagnosis not present

## 2020-01-01 DIAGNOSIS — G4733 Obstructive sleep apnea (adult) (pediatric): Secondary | ICD-10-CM | POA: Diagnosis not present

## 2020-01-07 ENCOUNTER — Other Ambulatory Visit: Payer: Self-pay

## 2020-01-07 ENCOUNTER — Ambulatory Visit: Payer: Medicare Other | Admitting: Family Medicine

## 2020-01-07 ENCOUNTER — Ambulatory Visit
Admission: EM | Admit: 2020-01-07 | Discharge: 2020-01-07 | Disposition: A | Discharge status: Home / Self Care | Payer: Medicare Other

## 2020-01-07 DIAGNOSIS — I10 Essential (primary) hypertension: Secondary | ICD-10-CM

## 2020-01-07 NOTE — Discharge Instructions (Signed)
Your blood pressure is elevated today at 161/80.  Please have this rechecked by your primary care provider in 1-2 weeks.

## 2020-01-07 NOTE — ED Provider Notes (Signed)
[facility]    CSN: [id number] Arrival date & time: 01/07/20  1032      History   Chief Complaint Chief Complaint  Patient presents with  . Hypertension    HPI [facility] is a 86 y.o. female.   Accompanied by a caregiver, patient presents with elevated blood pressure readings at home x1 week.  The caregiver states her blood pressure was systolic 326 this morning.  Patient denies headache, dizziness, chest pain, shortness of breath, abdominal pain, or other symptoms.  Patient is on Eliquis.  Her medical history is complicated, including diabetes, hypertension, CKD, CVA.  Patient states she has an appointment scheduled with her PCP on 01/26/2020 and was unable to get a sooner appointment.  The history is provided by the patient and a caregiver.    Past Medical History:  Diagnosis Date  . CKD (chronic kidney disease), stage III   . Diabetes mellitus without complication ([place])   . History of blood transfusion   . Hyperlipidemia   . Hypertension   . Stroke [facility])     Patient Active Problem List   Diagnosis Date Noted  . Leukopenia 12/08/2019  . Muscle strain 12/08/2019  . Pedal edema 11/26/2019  . Dysarthria and anarthria 11/07/2019  . Atrial fibrillation ([place]) 10/21/2019  . Nocturia 10/21/2019  . Seborrheic keratosis 10/21/2019  . Carotid stenosis 08/27/2019  . Thyroid nodule 08/27/2019  . Status post placement of implantable loop recorder 08/07/2019  . Anemia 04/18/2019  . Aphasia 04/12/2019  . Benign hypertensive renal disease 04/07/2019  . Hyperparathyroidism due to renal insufficiency ([place]) 04/07/2019  . 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 Proteinuria 04/07/2019  . History of stroke with current residual effects 03/15/2019  . Positional numbness and tingling in both hands, ulnar aspect 09/19/2018  . Hyperlipidemia associated with type 2 diabetes mellitus ([place]) 07/25/2018    . Contusion of left shoulder 04/11/2018  . Itching 10/23/2017  . Nevus 04/17/2017  . CKD stage 4 due to type 2 diabetes mellitus ([place]) 02/02/2015  . Vitamin D deficiency 02/02/2015  . Obesity (BMI 30-39.9) 08/03/2014  . DM type 2 (diabetes mellitus, type 2) ([place]) 01/29/2014  . Essential hypertension 01/13/2014  . HLD (hyperlipidemia) 01/13/2014    Past Surgical History:  Procedure Laterality Date  . ABDOMINAL HYSTERECTOMY  1985  . LOOP RECORDER INSERTION N/A 07/30/2019   Procedure: LOOP RECORDER INSERTION;  Surgeon: Isaias Cowman, MD;  Location: [facility];  Service: Cardiovascular;  Laterality: N/A;  . TEE WITHOUT CARDIOVERSION N/A 04/14/2019   Procedure: TRANSESOPHAGEAL ECHOCARDIOGRAM (TEE);  Surgeon: Teodoro Spray, MD;  Location: [facility] ORS;  Service: Cardiovascular;  Laterality: N/A;    OB History   No obstetric history on file.      Home Medications    Prior to Admission medications   Medication Sig Start Date End Date Taking? Authorizing Provider  ACCU-CHEK GUIDE test strip USE TO CHECK BLOOD SUGARS TWICE DAILY. E11.9 04/04/18   [name], MD  aspirin 81 MG tablet Take 81 mg by mouth daily at 12 noon.     [provider]  blood glucose meter kit and supplies KIT Dispense based on patient and insurance preference. Check CBGs two times daily. E11.9. 06/02/19   [name], MD  blood glucose meter kit and supplies Dispense based on patient and insurance preference. Use up to four times daily as directed. E11.9 09/14/16   Lacinda Axon,  Jayce G, DO  carvedilol (COREG) 6.25 MG tablet Take 1 tablet (6.25 mg total) by mouth 2 (two) times daily with a meal. 12/02/19   [name], MD  ELIQUIS 2.5 MG TABS tablet SMARTSIG:1 Tablet(s) By Mouth Every 12 Hours 10/06/19   [provider]  ezetimibe (ZETIA) 10 MG tablet Take 1 tablet (10 mg total) by mouth daily. 10/01/19   [name], MD  Ferrous Sulfate (IRON) 28 MG TABS Take 28 mg by  mouth daily at 12 noon.    [provider]  glimepiride (AMARYL) 1 MG tablet TAKE THREE TABLETS BY MOUTH DAILY 09/26/19   [name], MD  Lancets [facility] ULTRASOFT) lancets Use as instructed 09/14/16   Coral Spikes, DO  lisinopril (ZESTRIL) 10 MG tablet Take 1 tablet (10 mg total) by mouth daily. Patient not taking: Reported on 12/10/2019 10/01/19   [name], MD  rosuvastatin (CRESTOR) 40 MG tablet TAKE ONE TABLET BY MOUTH DAILY 12/23/19   [name], MD  Vitamin D, Ergocalciferol, (DRISDOL) 50000 units CAPS capsule Take 50,000 Units by mouth every 30 (thirty) days. 10/05/17   [provider]    Family History Family History  Problem Relation Age of Onset  . Stroke Mother   . Diabetes Mother   . Heart disease Father   . Kidney disease Sister   . Diabetes Sister   . Kidney disease Brother   . Heart disease Sister   . Diabetes Sister   . Diabetes Sister   . Diabetes Sister   . Kidney disease Sister   . Heart disease Sister   . Kidney disease Brother        kidney transplant  . Early death Brother 73       Truck Accident - died  . Heart disease Brother     Social History Social History   Tobacco Use  . Smoking status: Never Smoker  . Smokeless tobacco: Never Used  Substance Use Topics  . Alcohol use: No  . Drug use: No     Allergies   Nsaids   Review of Systems Review of Systems  Constitutional: Negative for chills, diaphoresis and fever.  HENT: Negative for ear pain and sore throat.   Eyes: Negative for pain and visual disturbance.  Respiratory: Negative for cough and shortness of breath.   Cardiovascular: Negative for chest pain, palpitations and leg swelling.  Gastrointestinal: Negative for abdominal pain, nausea and vomiting.  Genitourinary: Negative for dysuria and hematuria.  Musculoskeletal: Negative for arthralgias and back pain.  Skin: Negative for color change and rash.  Neurological: Negative for dizziness,  seizures, syncope, weakness and headaches.  All other systems reviewed and are negative.    Physical Exam Triage Vital Signs ED Triage Vitals  Enc Vitals Group     BP      Pulse      Resp      Temp      Temp src      SpO2      Weight      Height      Head Circumference      Peak Flow      Pain Score      Pain Loc      Pain Edu?      Excl. in [place]?    No data found.  Updated Vital Signs BP (!) 161/80   Pulse 62   Temp 98.2 F (36.8 C)   Resp 12  SpO2 98%   Visual Acuity Right Eye Distance:   Left Eye Distance:   Bilateral Distance:    Right Eye Near:   Left Eye Near:    Bilateral Near:     Physical Exam Vitals and nursing note reviewed.  Constitutional:      General: She is not in acute distress.    Appearance: She is well-developed.  HENT:     Head: Normocephalic and atraumatic.     Right Ear: Tympanic membrane normal.     Left Ear: Tympanic membrane normal.     Nose: Nose normal.     Mouth/Throat:     Mouth: Mucous membranes are moist.     Pharynx: Oropharynx is clear.  Eyes:     Conjunctiva/sclera: Conjunctivae normal.  Cardiovascular:     Rate and Rhythm: Normal rate and regular rhythm.     Heart sounds: Normal heart sounds. No murmur heard.   Pulmonary:     Effort: Pulmonary effort is normal. No respiratory distress.     Breath sounds: Normal breath sounds.  Abdominal:     Palpations: Abdomen is soft.     Tenderness: There is no abdominal tenderness. There is no guarding or rebound.  Musculoskeletal:     Cervical back: Neck supple.     Right lower leg: No edema.     Left lower leg: No edema.  Skin:    General: Skin is warm and dry.     Findings: No rash.  Neurological:     Mental Status: She is alert and oriented to person, place, and time. Mental status is at baseline.  Psychiatric:        Mood and Affect: Mood normal.        Behavior: Behavior normal.      UC Treatments / Results  Labs (all labs ordered are listed, but only  abnormal results are displayed) Labs Reviewed - No data to display  EKG   Radiology No results found.  Procedures Procedures (including critical care time)  Medications Ordered in UC Medications - No data to display  Initial Impression / Assessment and Plan / UC Course  I have reviewed the triage vital signs and the nursing notes.  Pertinent labs & imaging results that were available during my care of the patient were reviewed by me and considered in my medical decision making (see chart for details).   Elevated blood pressure reading with known hypertension.  Patient refuses EKG or lab work; She states she makes her own medical decisions.  Instructed patient to keep a record of her blood pressure daily to take to her PCP at her visit scheduled on July 26th.  Instructed her to follow-up sooner, return here, or go to the ED if she has elevated blood pressure reading that is concerning to her or develops concerning symptoms.      Final Clinical Impressions(s) / UC Diagnoses   Final diagnoses:  Elevated blood pressure reading in office with diagnosis of hypertension     Discharge Instructions     Your blood pressure is elevated today at 161/80.  Please have this rechecked by your primary care provider in 1-2 weeks.           ED Prescriptions    None     PDMP not reviewed this encounter.   Sharion Balloon, NP 01/07/20 1059

## 2020-01-07 NOTE — ED Triage Notes (Signed)
Patient reports her BP has been elevated x6 days. Denies headaches or dizziness.

## 2020-01-13 ENCOUNTER — Telehealth: Payer: Self-pay

## 2020-01-13 NOTE — Telephone Encounter (Addendum)
The caregiver states her blood pressure was 180/79 this morning. Patient has missed dosages on medication. Caregiver think that her speech is off. no other sx. Patient has appointment with  Dr Olivia Mackie tomorrow at 1300. Patient refused UC/ED.

## 2020-01-14 ENCOUNTER — Ambulatory Visit: Payer: Medicare Other | Admitting: Internal Medicine

## 2020-01-14 NOTE — Telephone Encounter (Signed)
Patient stated her BP has been 126/67. She canceled appointment and wanted to Follow up with PCP.caregiver is carmella contact number is [phone]

## 2020-01-14 NOTE — Telephone Encounter (Signed)
Please follow-up with the patient. It looks like she may has cancelled her appointment with Dr McLean-Scocuzza for today. Please see if the patient got evaluated. Thanks.

## 2020-01-15 ENCOUNTER — Encounter (INDEPENDENT_AMBULATORY_CARE_PROVIDER_SITE_OTHER): Payer: Medicare Other | Admitting: Ophthalmology

## 2020-01-15 ENCOUNTER — Other Ambulatory Visit: Payer: Self-pay

## 2020-01-15 DIAGNOSIS — I10 Essential (primary) hypertension: Secondary | ICD-10-CM

## 2020-01-15 DIAGNOSIS — E11319 Type 2 diabetes mellitus with unspecified diabetic retinopathy without macular edema: Secondary | ICD-10-CM

## 2020-01-15 DIAGNOSIS — H43813 Vitreous degeneration, bilateral: Secondary | ICD-10-CM

## 2020-01-15 DIAGNOSIS — E113391 Type 2 diabetes mellitus with moderate nonproliferative diabetic retinopathy without macular edema, right eye: Secondary | ICD-10-CM

## 2020-01-15 DIAGNOSIS — E113292 Type 2 diabetes mellitus with mild nonproliferative diabetic retinopathy without macular edema, left eye: Secondary | ICD-10-CM

## 2020-01-15 DIAGNOSIS — H35033 Hypertensive retinopathy, bilateral: Secondary | ICD-10-CM | POA: Diagnosis not present

## 2020-01-21 ENCOUNTER — Other Ambulatory Visit: Payer: Self-pay | Admitting: Family Medicine

## 2020-01-21 ENCOUNTER — Ambulatory Visit: Payer: Medicare Other | Admitting: Family Medicine

## 2020-01-21 DIAGNOSIS — E1122 Type 2 diabetes mellitus with diabetic chronic kidney disease: Secondary | ICD-10-CM

## 2020-01-21 DIAGNOSIS — N183 Chronic kidney disease, stage 3 unspecified: Secondary | ICD-10-CM

## 2020-01-26 ENCOUNTER — Other Ambulatory Visit: Payer: Self-pay

## 2020-01-26 ENCOUNTER — Ambulatory Visit (INDEPENDENT_AMBULATORY_CARE_PROVIDER_SITE_OTHER): Payer: Medicare Other | Admitting: Family Medicine

## 2020-01-26 ENCOUNTER — Encounter: Payer: Self-pay | Admitting: Family Medicine

## 2020-01-26 DIAGNOSIS — E1122 Type 2 diabetes mellitus with diabetic chronic kidney disease: Secondary | ICD-10-CM

## 2020-01-26 DIAGNOSIS — I639 Cerebral infarction, unspecified: Secondary | ICD-10-CM | POA: Diagnosis not present

## 2020-01-26 DIAGNOSIS — I10 Essential (primary) hypertension: Secondary | ICD-10-CM

## 2020-01-26 DIAGNOSIS — E785 Hyperlipidemia, unspecified: Secondary | ICD-10-CM | POA: Diagnosis not present

## 2020-01-26 DIAGNOSIS — N183 Chronic kidney disease, stage 3 unspecified: Secondary | ICD-10-CM | POA: Diagnosis not present

## 2020-01-26 DIAGNOSIS — M533 Sacrococcygeal disorders, not elsewhere classified: Secondary | ICD-10-CM | POA: Insufficient documentation

## 2020-01-26 NOTE — Progress Notes (Signed)
  [name], MD Phone: [phone]  [facility] is a 86 y.o. female who presents today for f/u.  HYPERTENSION  Disease Monitoring  Home BP Monitoring [id number] systolic Chest pain- no    Dyspnea- no Medications  Compliance-  Taking coreg, lisinopril.   Edema- resolved  HYPERLIPIDEMIA Symptoms Chest pain on exertion:  no   Medications: Compliance- taking crestor Right upper quadrant pain- no  Muscle aches- no  DM: taking glimeperide. A1c 6.7. eating healthier.  Coccydynia: Patient notes this is been going on for a week or so.  Feels as though it is irritated near her coccyx.  She has been placing cortisone and Neosporin on it.  No injury.  No radiation.  No numbness or weakness.  No incontinence.  She does sit a lot at home.      Social History   Tobacco Use  Smoking Status Never Smoker  Smokeless Tobacco Never Used     ROS see history of present illness  Objective  Physical Exam Vitals:   01/26/20 1031  BP: (!) 140/80  Pulse: 60  Temp: 98.9 F (37.2 C)  SpO2: 97%    BP Readings from Last 3 Encounters:  01/26/20 (!) 140/80  01/07/20 (!) 161/80  12/10/19 (!) 194/79   Wt Readings from Last 3 Encounters:  01/26/20 170 lb 12.8 oz (77.5 kg)  12/10/19 173 lb (78.5 kg)  12/08/19 176 lb (79.8 kg)    Physical Exam Constitutional:      General: She is not in acute distress.    Appearance: She is not diaphoretic.  Cardiovascular:     Rate and Rhythm: Normal rate and regular rhythm.     Heart sounds: Normal heart sounds.  Pulmonary:     Effort: Pulmonary effort is normal.     Breath sounds: Normal breath sounds.  Musculoskeletal:     Comments: Thayer Jew CMA served as chaperone, no skin lesions noted over her coccyx, there is no tenderness  Skin:    General: Skin is warm and dry.  Neurological:     Mental Status: She is alert.      Assessment/Plan: Please see individual problem list.  Essential hypertension Above goal.  We will check with  her nephrologist to see if we can increase her lisinopril dose to 20 mg.  She will continue her carvedilol.  Dose titration of carvedilol is limited by her pulse.  HLD (hyperlipidemia) Well-controlled.  Continue Crestor.  DM type 2 (diabetes mellitus, type 2) ([place]) Improved control based on recent A1c.  She will continue glimepiride.  Continue healthy diet.  Coccydynia Discussed she could discontinue the topical treatments.  Discussed getting up and moving around more frequently.  Discussed placing a cushion under one of her buttocks to offload the area and then alternating this.  She will monitor.   No orders of the defined types were placed in this encounter.   No orders of the defined types were placed in this encounter.   This visit occurred during the SARS-CoV-2 public health emergency.  Safety protocols were in place, including screening questions prior to the visit, additional usage of staff PPE, and extensive cleaning of exam room while observing appropriate contact time as indicated for disinfecting solutions.    [name], MD [place]

## 2020-01-26 NOTE — Assessment & Plan Note (Signed)
Above goal.  We will check with her nephrologist to see if we can increase her lisinopril dose to 20 mg.  She will continue her carvedilol.  Dose titration of carvedilol is limited by her pulse.

## 2020-01-26 NOTE — Assessment & Plan Note (Signed)
Improved control based on recent A1c.  She will continue glimepiride.  Continue healthy diet.

## 2020-01-26 NOTE — Assessment & Plan Note (Signed)
Discussed she could discontinue the topical treatments.  Discussed getting up and moving around more frequently.  Discussed placing a cushion under one of her buttocks to offload the area and then alternating this.  She will monitor.

## 2020-01-26 NOTE — Patient Instructions (Signed)
Nice to see you. We will let you know what I hear from your kidney specialist. Please try to place cushions underneath one buttocks and then alternate sides to take pressure off of your tailbone.

## 2020-01-26 NOTE — Assessment & Plan Note (Signed)
Well controlled. Continue Crestor. 

## 2020-01-27 ENCOUNTER — Telehealth: Payer: Self-pay | Admitting: Family Medicine

## 2020-01-27 DIAGNOSIS — I10 Essential (primary) hypertension: Secondary | ICD-10-CM

## 2020-01-27 MED ORDER — LISINOPRIL 20 MG PO TABS: 20.0000 mg | ORAL_TABLET | Freq: Every day | ORAL | 1 refills | Status: DC

## 2020-01-27 NOTE — Telephone Encounter (Signed)
-----   Message from Lavonia Dana, MD sent at 01/26/2020  3:28 PM EDT ----- Regarding: RE: BP I agree with the plan. Increasing lisinopril to 20mg  is appropriate. I will see her later this week.  Thanks  SCK ----- Message ----- From: [name], MD Sent: 01/26/2020  10:53 AM EDT To: Lavonia Dana, MD Subject: BP                                             Hi Dr Juleen China,   I saw Mrs Spargur today for follow-up. She noted she is seeing you on Thursday for follow-up. Her BP has been running above goal in the [id number] systolic at home. Given her CKD stage 4 I wanted to check with you regarding the potential for increasing her lisinopril to get better control of her BP. I was thinking of increasing to 20 mg and rechecking labs about a week after to follow-up on her kidney function and potassium. Please let me know if you think this would be an ok option for her. Thanks for your help.   [name]

## 2020-01-27 NOTE — Telephone Encounter (Signed)
Patient's phone was busy.  Will try later.  Kayliegh Boyers,cma

## 2020-01-27 NOTE — Telephone Encounter (Signed)
Please let the patient know that I heard back from her kidney specialist.  They noted it would be okay to increase lisinopril.  I sent in lisinopril 20 mg for her to take once daily.  She needs labs in 7 to 10 days when she starts the increased dose.  Orders placed.

## 2020-01-29 ENCOUNTER — Other Ambulatory Visit: Payer: Self-pay | Admitting: Family Medicine

## 2020-01-29 DIAGNOSIS — N2581 Secondary hyperparathyroidism of renal origin: Secondary | ICD-10-CM | POA: Diagnosis not present

## 2020-01-29 DIAGNOSIS — E1122 Type 2 diabetes mellitus with diabetic chronic kidney disease: Secondary | ICD-10-CM | POA: Diagnosis not present

## 2020-01-29 DIAGNOSIS — I129 Hypertensive chronic kidney disease with stage 1 through stage 4 chronic kidney disease, or unspecified chronic kidney disease: Secondary | ICD-10-CM | POA: Diagnosis not present

## 2020-01-29 DIAGNOSIS — R809 Proteinuria, unspecified: Secondary | ICD-10-CM | POA: Diagnosis not present

## 2020-01-29 DIAGNOSIS — N184 Chronic kidney disease, stage 4 (severe): Secondary | ICD-10-CM | POA: Diagnosis not present

## 2020-01-29 DIAGNOSIS — I10 Essential (primary) hypertension: Secondary | ICD-10-CM

## 2020-01-29 MED ORDER — LISINOPRIL 10 MG PO TABS: 10.0000 mg | ORAL_TABLET | Freq: Every day | ORAL | 1 refills | Status: DC

## 2020-01-29 NOTE — Telephone Encounter (Signed)
I called the patient and informed her that the Lisinopril will be increased to 20 mg and once she starts taking it she needs to call and schedule a lab appointment in 10 days and she understood.  Jammi Morrissette,cma

## 2020-02-09 DIAGNOSIS — N189 Chronic kidney disease, unspecified: Secondary | ICD-10-CM | POA: Diagnosis not present

## 2020-02-09 DIAGNOSIS — N2581 Secondary hyperparathyroidism of renal origin: Secondary | ICD-10-CM | POA: Diagnosis not present

## 2020-02-09 DIAGNOSIS — I129 Hypertensive chronic kidney disease with stage 1 through stage 4 chronic kidney disease, or unspecified chronic kidney disease: Secondary | ICD-10-CM | POA: Diagnosis not present

## 2020-02-09 DIAGNOSIS — Z8673 Personal history of transient ischemic attack (TIA), and cerebral infarction without residual deficits: Secondary | ICD-10-CM | POA: Diagnosis not present

## 2020-02-09 DIAGNOSIS — E1122 Type 2 diabetes mellitus with diabetic chronic kidney disease: Secondary | ICD-10-CM | POA: Diagnosis not present

## 2020-02-09 DIAGNOSIS — R809 Proteinuria, unspecified: Secondary | ICD-10-CM | POA: Diagnosis not present

## 2020-02-09 DIAGNOSIS — N184 Chronic kidney disease, stage 4 (severe): Secondary | ICD-10-CM | POA: Diagnosis not present

## 2020-02-10 DIAGNOSIS — I63431 Cerebral infarction due to embolism of right posterior cerebral artery: Secondary | ICD-10-CM | POA: Diagnosis not present

## 2020-02-26 DIAGNOSIS — R809 Proteinuria, unspecified: Secondary | ICD-10-CM | POA: Diagnosis not present

## 2020-02-26 DIAGNOSIS — N2581 Secondary hyperparathyroidism of renal origin: Secondary | ICD-10-CM | POA: Diagnosis not present

## 2020-02-26 DIAGNOSIS — N184 Chronic kidney disease, stage 4 (severe): Secondary | ICD-10-CM | POA: Diagnosis not present

## 2020-02-26 DIAGNOSIS — I129 Hypertensive chronic kidney disease with stage 1 through stage 4 chronic kidney disease, or unspecified chronic kidney disease: Secondary | ICD-10-CM | POA: Diagnosis not present

## 2020-02-26 DIAGNOSIS — E1122 Type 2 diabetes mellitus with diabetic chronic kidney disease: Secondary | ICD-10-CM | POA: Diagnosis not present

## 2020-02-29 ENCOUNTER — Other Ambulatory Visit: Payer: Self-pay | Admitting: Family Medicine

## 2020-03-01 ENCOUNTER — Telehealth: Payer: Self-pay | Admitting: Family Medicine

## 2020-03-01 NOTE — Telephone Encounter (Signed)
The caregiver that was with the patient called and said [facility] had a cough last Monday 02/23/20 and went to be tested. It was negative. [facility]'s appointment was cancelled by the automated system.  She was trying to get her appointment back on tomorrow 8/31 but with her being tested she needs another appointment. With Dr. Caryl Bis schedule, there is no availability to put her appointment. [facility] needs an appointment on Mon-Thurs before 1pm. Please advise.

## 2020-03-02 ENCOUNTER — Ambulatory Visit: Payer: Medicare Other | Admitting: Family Medicine

## 2020-03-02 NOTE — Telephone Encounter (Signed)
Does 8:15 am on 9/15 work for them? If so please get this scheduled.

## 2020-03-17 ENCOUNTER — Other Ambulatory Visit: Payer: Self-pay

## 2020-03-17 ENCOUNTER — Ambulatory Visit (INDEPENDENT_AMBULATORY_CARE_PROVIDER_SITE_OTHER): Payer: Medicare Other | Admitting: Family Medicine

## 2020-03-17 ENCOUNTER — Encounter: Payer: Self-pay | Admitting: Family Medicine

## 2020-03-17 DIAGNOSIS — I480 Paroxysmal atrial fibrillation: Secondary | ICD-10-CM

## 2020-03-17 DIAGNOSIS — N183 Chronic kidney disease, stage 3 unspecified: Secondary | ICD-10-CM

## 2020-03-17 DIAGNOSIS — E1122 Type 2 diabetes mellitus with diabetic chronic kidney disease: Secondary | ICD-10-CM | POA: Diagnosis not present

## 2020-03-17 DIAGNOSIS — I639 Cerebral infarction, unspecified: Secondary | ICD-10-CM | POA: Diagnosis not present

## 2020-03-17 DIAGNOSIS — I10 Essential (primary) hypertension: Secondary | ICD-10-CM | POA: Diagnosis not present

## 2020-03-17 NOTE — Progress Notes (Signed)
Brianna Rumps, MD Phone: [phone]  [facility] is a 86 y.o. female who presents today for f/u.  HYPERTENSION  Disease Monitoring  Home BP Monitoring [id number] systolic Chest pain- no    Dyspnea- no Medications  Compliance-  Taking amlodipine 5 mg daily, coreg 6.25 mg BID, lisinopril 10 mg daily.  Edema- no  DIABETES Disease Monitoring: Blood Sugar ranges-150s Polyuria/phagia/dipsia- no      Optho- in January 2021 Medications: Compliance- taking glimeperide 3 mg daily Hypoglycemic symptoms- no  Patient wonders if she will have to be on a blood thinner long-term.  She is on this for stroke prevention.  She does note feeling colder since going on this and having some sweats occasionally since going on this medication.  No bleeding issues.     Social History   Tobacco Use  Smoking Status Never Smoker  Smokeless Tobacco Never Used     ROS see history of present illness  Objective  Physical Exam Vitals:   03/17/20 0822  BP: (!) 158/70  Pulse: (!) 58  Resp: 16  Temp: 98.3 F (36.8 C)  SpO2: 99%    BP Readings from Last 3 Encounters:  03/17/20 (!) 158/70  01/26/20 (!) 140/80  01/07/20 (!) 161/80   Wt Readings from Last 3 Encounters:  03/17/20 174 lb 2 oz (79 kg)  01/26/20 170 lb 12.8 oz (77.5 kg)  12/10/19 173 lb (78.5 kg)    Physical Exam Constitutional:      General: She is not in acute distress.    Appearance: She is not diaphoretic.  Cardiovascular:     Rate and Rhythm: Normal rate and regular rhythm.     Heart sounds: Normal heart sounds.  Pulmonary:     Effort: Pulmonary effort is normal.     Breath sounds: Normal breath sounds.  Musculoskeletal:     Right lower leg: No edema.     Left lower leg: No edema.  Skin:    General: Skin is warm and dry.  Neurological:     Mental Status: She is alert.      Assessment/Plan: Please see individual problem list.  Essential hypertension Above goal.  I will check with her nephrologist to  ensure that going up on her lisinopril is okay.  Once I hear back from them we will contact the patient.  She will continue amlodipine 5 mg daily, carvedilol 6.25 mg twice daily, and lisinopril 10 mg daily at this time.  Atrial fibrillation ([place]) Discussed that her Eliquis is for stroke prevention related to A. fib.  She will remain on this at this time.  Continue Eliquis 2.5 mg twice daily.  I suspect her feeling cold is related to the blood thinner though we will check a TSH with future labs.  DM type 2 (diabetes mellitus, type 2) ([place]) Previously well controlled.  Continue glimepiride 3 mg once daily.  Check A1c with labs later this month.    Orders Placed This Encounter  Procedures  . TSH    Standing Status:   Future    Standing Expiration Date:   03/17/2021  . HgB A1c    Standing Status:   Future    Standing Expiration Date:   03/17/2021  . Basic Metabolic Panel (BMET)    Standing Status:   Future    Standing Expiration Date:   03/17/2021    No orders of the defined types were placed in this encounter.   Inari was seen today for follow-up.  Diagnoses and all orders  for this visit:  Essential hypertension -     Basic Metabolic Panel (BMET); Future  Paroxysmal atrial fibrillation (HCC) -     TSH; Future  Type 2 diabetes mellitus with stage 3 chronic kidney disease, without long-term current use of insulin, unspecified whether stage 3a or 3b CKD (H[facility]) -     HgB A1c; Future     This visit occurred during the SARS-CoV-2 public health emergency.  Safety protocols were in place, including screening questions prior to the visit, additional usage of staff PPE, and extensive cleaning of exam room while observing appropriate contact time as indicated for disinfecting solutions.    Brianna Rumps, MD [place]

## 2020-03-17 NOTE — Patient Instructions (Signed)
Nice to see you. We will have you return for lab work. I will let you know about your lisinopril once I hear back from the nephrologist.

## 2020-03-17 NOTE — Assessment & Plan Note (Addendum)
Discussed that her Eliquis is for stroke prevention related to A. fib.  She will remain on this at this time.  Continue Eliquis 2.5 mg twice daily.  I suspect her feeling cold is related to the blood thinner though we will check a TSH with future labs.

## 2020-03-17 NOTE — Assessment & Plan Note (Signed)
Previously well controlled.  Continue glimepiride 3 mg once daily.  Check A1c with labs later this month.

## 2020-03-17 NOTE — Assessment & Plan Note (Signed)
Above goal.  I will check with her nephrologist to ensure that going up on her lisinopril is okay.  Once I hear back from them we will contact the patient.  She will continue amlodipine 5 mg daily, carvedilol 6.25 mg twice daily, and lisinopril 10 mg daily at this time.

## 2020-03-22 ENCOUNTER — Telehealth: Payer: Self-pay | Admitting: Family Medicine

## 2020-03-22 DIAGNOSIS — I10 Essential (primary) hypertension: Secondary | ICD-10-CM

## 2020-03-22 MED ORDER — LISINOPRIL 20 MG PO TABS: 20.0000 mg | ORAL_TABLET | Freq: Every day | ORAL | 1 refills | Status: DC

## 2020-03-22 NOTE — Telephone Encounter (Signed)
I called an dspoke with the patient and she stated she did not want the increase on the medication, it was all to much.  [facility],cma

## 2020-03-22 NOTE — Telephone Encounter (Signed)
Patient's home care nurse called. She said that Dr. Caryl Bis was going to increase one of patients medications, but had to speak to patient's kidney doctor first. In home healthcare worker did not know what medication Dr. Caryl Bis was talking about and if it was increased

## 2020-03-22 NOTE — Telephone Encounter (Signed)
I heard back from her nephrologist.  They were okay with increasing lisinopril.  We will increase the lisinopril to 20 mg once daily.  She needs labs and a BP check 7 to 10 days after increasing this dose.  Order placed.  Please get her scheduled for the labs and a BP check with nursing.  Thanks.

## 2020-03-26 NOTE — Telephone Encounter (Signed)
I sent a prior message about the patient on yesterday and the patient's cna stated she has always taken 20 mg of the lisinopril and this morning her BP is 205/87.  Please advise.  [facility],cma

## 2020-03-26 NOTE — Telephone Encounter (Signed)
I called the cna to see if she would take her BP again today and she did not have a VM set up. I called the patient and she stated her granddaughter checked her BP and it was 157/80 and she is fine, the patient stated that the cna is calling everyone and getting on her nerves, she stated she is fine and wants everyone to leave her alone, she said when she is quiet they think something is wrong and it is not and she does not want to change any medicines.  [facility],cma

## 2020-03-26 NOTE — Telephone Encounter (Signed)
Noted. Please see if they can recheck her BP today. We could change her lisinopril to losartan and see if that provides better benefit for her BP. She would need labs 7-10 days after starting the losartan.

## 2020-03-26 NOTE — Telephone Encounter (Signed)
Patient's CNA -caregiver Dwaine Deter [phone] called and stated that patient's blood pressure is elevated 205/87

## 2020-03-27 NOTE — Telephone Encounter (Signed)
Noted.  If we do not get her blood pressure lower she runs a risk of stroke and heart attack.  She is already had a stroke so were trying to limit the risk of that.  I would encourage her to change the lisinopril to losartan and see if she gets better blood pressure control.

## 2020-03-29 NOTE — Telephone Encounter (Signed)
I called and spoke with the patient caregiver Reeves Forth and explained to her that the provider wanted to change her Lisinopril 20 mg  to Losartan to see if it helps due to her having a recent stroke and she stated for you to go ahead and send the medication and she will pick it up tomorrow and let he know that she needs this change to control her BP.  Mesiah Manzo,cma

## 2020-03-30 NOTE — Telephone Encounter (Signed)
Patient's caregiver Brianna Dodson called stated that she went to the [facility] to pick up medication Losartan was not there. Tanza Pellot,cma

## 2020-03-30 NOTE — Telephone Encounter (Signed)
I spoke with the patient myself and asked her what her last BP reading was and this morning it was 150/something she stated and I informed her that that was too high and that the provider wanted to change the medication to make it lower.  She thought 150 was good and I informed her it was too high and she agreed to change lisinopril to Losartan, so you can send to pharmacy.  Shahzaib Azevedo,cma

## 2020-03-30 NOTE — Telephone Encounter (Signed)
We have to confirm with the patient that she is willing to take the new medicine.  We cannot just send it in without her being aware of it.  Please call the patient regarding the losartan and see if she is willing to take it.

## 2020-03-30 NOTE — Telephone Encounter (Signed)
Patient's caregiver Reeves Forth called stated that [facility] to [facility] Losartan was not there.

## 2020-03-31 MED ORDER — LOSARTAN POTASSIUM 50 MG PO TABS: 50.0000 mg | ORAL_TABLET | Freq: Every day | ORAL | 3 refills | Status: DC

## 2020-03-31 NOTE — Telephone Encounter (Signed)
Losartan sent to pharmacy. She will discontinue the lisinopril and start the losartan the next day. She needs repeat labs and a BP check in 7-10 days. Orders placed.

## 2020-03-31 NOTE — Telephone Encounter (Signed)
Brianna Dodson [phone] Her caregiver called and states that new bp medicine has not been called into the pharmacy-Please advise

## 2020-03-31 NOTE — Telephone Encounter (Signed)
Patient is waiting on the Losartan to be called into her pharmacy.  Terrez Ander,cma

## 2020-04-01 NOTE — Telephone Encounter (Signed)
I called the patient and informed her that the Losartan was sent to the pharmacy and I scheduled her lab and nurse visit with her caretaker in 10 days.  Nayab Aten,cma

## 2020-04-15 ENCOUNTER — Ambulatory Visit: Payer: Medicare Other

## 2020-04-15 ENCOUNTER — Other Ambulatory Visit: Payer: Medicare Other

## 2020-04-21 ENCOUNTER — Ambulatory Visit (INDEPENDENT_AMBULATORY_CARE_PROVIDER_SITE_OTHER): Payer: Medicare Other

## 2020-04-21 ENCOUNTER — Other Ambulatory Visit: Payer: Self-pay

## 2020-04-21 ENCOUNTER — Other Ambulatory Visit: Payer: Medicare Other

## 2020-04-21 DIAGNOSIS — N183 Chronic kidney disease, stage 3 unspecified: Secondary | ICD-10-CM

## 2020-04-21 DIAGNOSIS — I480 Paroxysmal atrial fibrillation: Secondary | ICD-10-CM | POA: Diagnosis not present

## 2020-04-21 DIAGNOSIS — E1122 Type 2 diabetes mellitus with diabetic chronic kidney disease: Secondary | ICD-10-CM

## 2020-04-21 DIAGNOSIS — I10 Essential (primary) hypertension: Secondary | ICD-10-CM | POA: Diagnosis not present

## 2020-04-21 LAB — HEMOGLOBIN A1C: Hgb A1c MFr Bld: 7.9 % — ABNORMAL HIGH (ref 4.6–6.5)

## 2020-04-21 LAB — BASIC METABOLIC PANEL
BUN: 35 mg/dL — ABNORMAL HIGH (ref 6–23)
CO2: 24 mEq/L (ref 19–32)
Calcium: 8.4 mg/dL (ref 8.4–10.5)
Chloride: 108 mEq/L (ref 96–112)
Creatinine, Ser: 2.53 mg/dL — ABNORMAL HIGH (ref 0.40–1.20)
GFR: 16.76 mL/min — ABNORMAL LOW (ref >60.00)
Glucose, Bld: 252 mg/dL — ABNORMAL HIGH (ref 70–99)
Potassium: 4.7 mEq/L (ref 3.5–5.1)
Sodium: 139 mEq/L (ref 135–145)

## 2020-04-21 LAB — TSH: TSH: 2.32 u[IU]/mL (ref 0.35–4.50)
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
## 2020-04-22 ENCOUNTER — Telehealth: Payer: Self-pay

## 2020-04-22 DIAGNOSIS — I10 Essential (primary) hypertension: Secondary | ICD-10-CM

## 2020-04-22 DIAGNOSIS — E1122 Type 2 diabetes mellitus with diabetic chronic kidney disease: Secondary | ICD-10-CM

## 2020-04-22 DIAGNOSIS — N183 Chronic kidney disease, stage 3 unspecified: Secondary | ICD-10-CM

## 2020-04-22 MED ORDER — ROSUVASTATIN CALCIUM 40 MG PO TABS
40.0000 mg | ORAL_TABLET | Freq: Every day | ORAL | 1 refills | Status: DC
Start: 2020-04-22 — End: 2020-07-29

## 2020-04-22 MED ORDER — ONETOUCH ULTRASOFT LANCETS MISC
6 refills | Status: DC
Start: 2020-04-22 — End: 2020-12-02

## 2020-04-22 MED ORDER — EZETIMIBE 10 MG PO TABS
10.0000 mg | ORAL_TABLET | Freq: Every day | ORAL | 3 refills | Status: DC
Start: 2020-04-22 — End: 2020-12-06

## 2020-04-22 MED ORDER — LOSARTAN POTASSIUM 50 MG PO TABS: 50.0000 mg | ORAL_TABLET | Freq: Every day | ORAL | 3 refills | Status: DC

## 2020-04-22 MED ORDER — AMLODIPINE BESYLATE 5 MG PO TABS
5.0000 mg | ORAL_TABLET | Freq: Every day | ORAL | 1 refills | Status: DC
Start: 2020-04-22 — End: 2020-09-08

## 2020-04-22 MED ORDER — CARVEDILOL 6.25 MG PO TABS
6.2500 mg | ORAL_TABLET | Freq: Two times a day (BID) | ORAL | 1 refills | Status: DC
Start: 2020-04-22 — End: 2020-10-07

## 2020-04-22 MED ORDER — GLIMEPIRIDE 1 MG PO TABS: 3.0000 mg | ORAL_TABLET | Freq: Every day | ORAL | 0 refills | Status: DC

## 2020-04-22 MED ORDER — ELIQUIS 2.5 MG PO TABS
ORAL_TABLET | ORAL | 1 refills | Status: DC
Start: 2020-04-22 — End: 2020-10-07

## 2020-04-22 MED ORDER — ACCU-CHEK GUIDE VI STRP: ORAL_STRIP | 0 refills | Status: DC

## 2020-04-22 NOTE — Progress Notes (Signed)
Patients BP was 147/ 101 today. Patient is taking her bp medication everyday.Marland Kitchen

## 2020-04-22 NOTE — Telephone Encounter (Signed)
Pt needs all medications sent to [place]

## 2020-05-03 NOTE — Progress Notes (Signed)
Noted. Please follow-up with the patient and see what her BP has been running as she has been on the losartan longer. If it is still uncontrolled we could consider in creasing her losartan dose and rechecking labs 7-10 days after increasing the dose. Thanks.

## 2020-06-03 DIAGNOSIS — Z23 Encounter for immunization: Secondary | ICD-10-CM | POA: Diagnosis not present

## 2020-06-03 DIAGNOSIS — I63431 Cerebral infarction due to embolism of right posterior cerebral artery: Secondary | ICD-10-CM | POA: Diagnosis not present

## 2020-06-03 DIAGNOSIS — N184 Chronic kidney disease, stage 4 (severe): Secondary | ICD-10-CM | POA: Diagnosis not present

## 2020-06-03 DIAGNOSIS — E785 Hyperlipidemia, unspecified: Secondary | ICD-10-CM | POA: Diagnosis not present

## 2020-06-03 DIAGNOSIS — Z95818 Presence of other cardiac implants and grafts: Secondary | ICD-10-CM | POA: Diagnosis not present

## 2020-06-03 DIAGNOSIS — R531 Weakness: Secondary | ICD-10-CM | POA: Diagnosis not present

## 2020-06-03 DIAGNOSIS — I480 Paroxysmal atrial fibrillation: Secondary | ICD-10-CM | POA: Diagnosis not present

## 2020-06-04 ENCOUNTER — Other Ambulatory Visit (INDEPENDENT_AMBULATORY_CARE_PROVIDER_SITE_OTHER): Payer: Self-pay | Admitting: Nurse Practitioner

## 2020-06-04 DIAGNOSIS — I6522 Occlusion and stenosis of left carotid artery: Secondary | ICD-10-CM

## 2020-06-08 ENCOUNTER — Ambulatory Visit (INDEPENDENT_AMBULATORY_CARE_PROVIDER_SITE_OTHER): Payer: Medicare Other

## 2020-06-08 ENCOUNTER — Other Ambulatory Visit: Payer: Self-pay

## 2020-06-08 ENCOUNTER — Ambulatory Visit (INDEPENDENT_AMBULATORY_CARE_PROVIDER_SITE_OTHER): Payer: Medicare Other | Admitting: Vascular Surgery

## 2020-06-08 DIAGNOSIS — I6522 Occlusion and stenosis of left carotid artery: Secondary | ICD-10-CM

## 2020-06-09 ENCOUNTER — Ambulatory Visit (INDEPENDENT_AMBULATORY_CARE_PROVIDER_SITE_OTHER): Payer: Medicare Other | Admitting: Family Medicine

## 2020-06-09 ENCOUNTER — Telehealth: Payer: Self-pay

## 2020-06-09 ENCOUNTER — Encounter: Payer: Self-pay | Admitting: Family Medicine

## 2020-06-09 VITALS — BP 140/80 | HR 57 | Temp 97.8°F | Ht 65.0 in | Wt 171.4 lb

## 2020-06-09 DIAGNOSIS — I10 Essential (primary) hypertension: Secondary | ICD-10-CM

## 2020-06-09 DIAGNOSIS — I639 Cerebral infarction, unspecified: Secondary | ICD-10-CM

## 2020-06-09 DIAGNOSIS — R413 Other amnesia: Secondary | ICD-10-CM | POA: Diagnosis not present

## 2020-06-09 DIAGNOSIS — F0150 Vascular dementia without behavioral disturbance: Secondary | ICD-10-CM | POA: Insufficient documentation

## 2020-06-09 DIAGNOSIS — R5383 Other fatigue: Secondary | ICD-10-CM | POA: Diagnosis not present

## 2020-06-09 DIAGNOSIS — N184 Chronic kidney disease, stage 4 (severe): Secondary | ICD-10-CM | POA: Diagnosis not present

## 2020-06-09 DIAGNOSIS — E1122 Type 2 diabetes mellitus with diabetic chronic kidney disease: Secondary | ICD-10-CM

## 2020-06-09 DIAGNOSIS — N185 Chronic kidney disease, stage 5: Secondary | ICD-10-CM

## 2020-06-09 DIAGNOSIS — I480 Paroxysmal atrial fibrillation: Secondary | ICD-10-CM | POA: Diagnosis not present

## 2020-06-09 DIAGNOSIS — N183 Chronic kidney disease, stage 3 unspecified: Secondary | ICD-10-CM | POA: Diagnosis not present

## 2020-06-09 DIAGNOSIS — E559 Vitamin D deficiency, unspecified: Secondary | ICD-10-CM

## 2020-06-09 DIAGNOSIS — G309 Alzheimer's disease, unspecified: Secondary | ICD-10-CM | POA: Insufficient documentation

## 2020-06-09 LAB — COMPREHENSIVE METABOLIC PANEL
ALT: 20 U/L (ref 0–35)
AST: 21 U/L (ref 0–37)
Albumin: 3.9 g/dL (ref 3.5–5.2)
Alkaline Phosphatase: 48 U/L (ref 39–117)
BUN: 29 mg/dL — ABNORMAL HIGH (ref 6–23)
CO2: 27 mEq/L (ref 19–32)
Calcium: 8.6 mg/dL (ref 8.4–10.5)
Chloride: 105 mEq/L (ref 96–112)
Creatinine, Ser: 3.26 mg/dL — ABNORMAL HIGH (ref 0.40–1.20)
GFR: 12.51 mL/min — CL (ref >60.00)
Glucose, Bld: 154 mg/dL — ABNORMAL HIGH (ref 70–99)
Potassium: 5.1 mEq/L (ref 3.5–5.1)
Sodium: 139 mEq/L (ref 135–145)
Total Bilirubin: 0.6 mg/dL (ref 0.2–1.2)
Total Protein: 7.2 g/dL (ref 6.0–8.3)

## 2020-06-09 LAB — POCT URINALYSIS DIPSTICK
Bilirubin, UA: NEGATIVE
Glucose, UA: NEGATIVE
Ketones, UA: NEGATIVE
Leukocytes, UA: NEGATIVE
Nitrite, UA: NEGATIVE
Protein, UA: POSITIVE — AB
Spec Grav, UA: 1.02 (ref 1.010–1.025)
Urobilinogen, UA: 0.2 E.U./dL
pH, UA: 6 (ref 5.0–8.0)

## 2020-06-09 LAB — CBC
HCT: 27.4 % — ABNORMAL LOW (ref 36.0–46.0)
Hemoglobin: 9.4 g/dL — ABNORMAL LOW (ref 12.0–15.0)
MCHC: 34.3 g/dL (ref 30.0–36.0)
MCV: 87.6 fl (ref 78.0–100.0)
Platelets: 161 10*3/uL (ref 150.0–400.0)
RBC: 3.13 Mil/uL — ABNORMAL LOW (ref 3.87–5.11)
RDW: 13.8 % (ref 11.5–15.5)
WBC: 4.1 10*3/uL (ref 4.0–10.5)

## 2020-06-09 LAB — TSH: TSH: 2.55 u[IU]/mL (ref 0.35–4.50)

## 2020-06-09 LAB — URINALYSIS, MICROSCOPIC ONLY

## 2020-06-09 LAB — IBC + FERRITIN
Ferritin: 107.9 ng/mL (ref 10.0–291.0)
Iron: 59 ug/dL (ref 42–145)
Saturation Ratios: 20.8 % (ref 20.0–50.0)
Transferrin: 203 mg/dL — ABNORMAL LOW (ref 212.0–360.0)

## 2020-06-09 LAB — VITAMIN D 25 HYDROXY (VIT D DEFICIENCY, FRACTURES): VITD: 28.7 ng/mL — ABNORMAL LOW (ref 30.00–100.00)

## 2020-06-09 LAB — VITAMIN B12: Vitamin B-12: 538 pg/mL (ref 211–911)

## 2020-06-09 MED ORDER — FREESTYLE LIBRE 14 DAY SENSOR MISC: 3 refills | Status: DC

## 2020-06-09 MED ORDER — FREESTYLE LIBRE 14 DAY READER DEVI: 0 refills | Status: DC

## 2020-06-09 NOTE — Telephone Encounter (Signed)
CRITICAL VALUE STICKER  CRITICAL VALUE: GFR 12.51  RECEIVER (on-site recipient of call): [place] NOTIFIED: 3:15 pm; 06/09/20  MESSENGER (representative from lab): Sarah  MD NOTIFIED: Josephina Gip  TIME OF NOTIFICATION:3:20  RESPONSE:

## 2020-06-09 NOTE — Assessment & Plan Note (Signed)
Sinus rhythm today. She will continue eliquis 2.5 mg BID.

## 2020-06-09 NOTE — Telephone Encounter (Signed)
Noted. Please contact the patient and see how much she is currently urinating currently. Please let her know that her kidney function is worse than it has been and if her urine output has decreased with this worsening kidney function she may need to go to the ED for evaluation.

## 2020-06-09 NOTE — Telephone Encounter (Signed)
Noted. Message sent to her nephrologist to get his input on this. Please contact the patient tomorrow to see how she is doing. Thanks.

## 2020-06-09 NOTE — Assessment & Plan Note (Signed)
Seems to be mild. I have advised she have her niece help with managing her finances. We will check B12 and TSH.

## 2020-06-09 NOTE — Telephone Encounter (Signed)
I called the patient and spoke with her and informed her that she had a critical lab, her kidney function has worsened and she needed to go to the ER for evaluation,Patient stated she would go to her kidney doctor and I informed her that it was to late in the day to see the kidney provider and she needed to go to the ER. Patient stated she could not go today and that she will be fine, I informed the patient that if her kidneys got any worse it would not be good and the provider wanted her to go today. Patient stated she was not going to the ER.  Kahlel Peake,cma

## 2020-06-09 NOTE — Assessment & Plan Note (Signed)
Undetermined cause. Denies cardiac symptoms and has seen cardiology already. We will check labs as outlined to evaluate for a cause.

## 2020-06-09 NOTE — Patient Instructions (Signed)
Nice to see you.  We will get labs today and contact you with the results.  Please let us know if your insurance does not cover the continuous glucose monitor.

## 2020-06-09 NOTE — Assessment & Plan Note (Addendum)
Improved control. Continue coreg 6.25 mg BID, amlodipine 10 mg daily, and losartan 50 mg daily. Check CMET.

## 2020-06-09 NOTE — Assessment & Plan Note (Signed)
Check vitamin D. 

## 2020-06-09 NOTE — Telephone Encounter (Signed)
Please let the patient know that I heard back from her nephrologist. He recommended getting repeat labs next week and he would try to see if he could see her sooner than her scheduled appointment later this month. I have placed an order for repeat labs to be done on Monday. Thanks.

## 2020-06-09 NOTE — Assessment & Plan Note (Signed)
Check renal function. Continue to see nephrology.

## 2020-06-09 NOTE — Progress Notes (Signed)
[name], MD Phone: [phone]  Brianna Dodson is a 86 y.o. female who presents today for f/u.  Fatigue: reports this has been going on for at least a couple of weeks. Just does not have any energy when she is up doing things. She discussed this with cardiology though no cardiac evaluation was planned. Denies CP and SOB. Has felt cold since she was placed on the eliquis. Denies depression.  Memory difficulty: mild in nature. Notes she pays too much on her bills at times and that lowers her bank account too much. She manages her own money though notes her niece could help her with this.   CKD stage 4: continues to see nephrology. Notes some slowed urination though no decrease frequency. Some occasional urgency. No dysuria.   Afib: on eliquis. Notes her insurance will no longer pay for this in January. Cardiology is working on patient assistance for her.   Social History   Tobacco Use  Smoking Status Never Smoker  Smokeless Tobacco Never Used     ROS see history of present illness  Objective  Physical Exam Vitals:   06/09/20 1137  BP: 140/80  Pulse: (!) 57  Temp: 97.8 F (36.6 C)  SpO2: 99%    BP Readings from Last 3 Encounters:  06/09/20 140/80  04/21/20 (!) 165/72  03/17/20 (!) 158/70   Wt Readings from Last 3 Encounters:  06/09/20 171 lb 6.4 oz (77.7 kg)  03/17/20 174 lb 2 oz (79 kg)  01/26/20 170 lb 12.8 oz (77.5 kg)    Physical Exam Constitutional:      General: She is not in acute distress.    Appearance: She is not diaphoretic.  Cardiovascular:     Rate and Rhythm: Normal rate and regular rhythm.     Heart sounds: Normal heart sounds.  Pulmonary:     Effort: Pulmonary effort is normal.     Breath sounds: Normal breath sounds.  Abdominal:     General: Bowel sounds are normal. There is no distension.     Palpations: Abdomen is soft.     Tenderness: There is no abdominal tenderness. There is no guarding or rebound.  Musculoskeletal:     Right  lower leg: No edema.     Left lower leg: No edema.  Lymphadenopathy:     Cervical: No cervical adenopathy.  Skin:    General: Skin is warm and dry.  Neurological:     Mental Status: She is alert.  Psychiatric:        Mood and Affect: Mood normal.      Assessment/Plan: Please see individual problem list.  Problem List Items Addressed This Visit    Atrial fibrillation ([place])    Sinus rhythm today. She will continue eliquis 2.5 mg BID.       CKD stage 4 due to type 2 diabetes mellitus ([place]) - Primary    Check renal function. Continue to see nephrology.       Relevant Orders   Comp Met (CMET)   CBC   IBC + Ferritin   Vitamin D (25 hydroxy)   POCT Urinalysis Dipstick (Completed)   Urine Microscopic   Protein / creatinine ratio, urine   DM type 2 (diabetes mellitus, type 2) ([place])    Attempting to get a CGM approved for the patient as it will be easier for her to monitor her sugars.       Relevant Medications   Continuous Blood Gluc Receiver (FREESTYLE LIBRE 14 DAY READER) DEVI  Continuous Blood Gluc Sensor (FREESTYLE LIBRE 14 DAY SENSOR) MISC   Other Relevant Orders   Comp Met (CMET)   Essential hypertension    Improved control. Continue coreg 6.25 mg BID, amlodipine 10 mg daily, and losartan 50 mg daily. Check CMET.       Fatigue    Undetermined cause. Denies cardiac symptoms and has seen cardiology already. We will check labs as outlined to evaluate for a cause.       Relevant Orders   Comp Met (CMET)   TSH   CBC   IBC + Ferritin   B12   Vitamin D (25 hydroxy)   Memory difficulties    Seems to be mild. I have advised she have her niece help with managing her finances. We will check B12 and TSH.       Relevant Orders   TSH   B12   Vitamin D deficiency    Check vitamin D.       Relevant Orders   Vitamin D (25 hydroxy)      This visit occurred during the SARS-CoV-2 public health emergency.  Safety protocols were in place, including screening questions  prior to the visit, additional usage of staff PPE, and extensive cleaning of exam room while observing appropriate contact time as indicated for disinfecting solutions.    [name], MD [place]

## 2020-06-09 NOTE — Assessment & Plan Note (Signed)
Attempting to get a CGM approved for the patient as it will be easier for her to monitor her sugars.

## 2020-06-09 NOTE — Addendum Note (Signed)
Addended by: [name] on: 06/09/2020 06:12 PM   Modules accepted: Orders

## 2020-06-10 ENCOUNTER — Telehealth: Payer: Self-pay | Admitting: Family Medicine

## 2020-06-10 ENCOUNTER — Other Ambulatory Visit (INDEPENDENT_AMBULATORY_CARE_PROVIDER_SITE_OTHER): Payer: Self-pay | Admitting: Vascular Surgery

## 2020-06-10 ENCOUNTER — Encounter (INDEPENDENT_AMBULATORY_CARE_PROVIDER_SITE_OTHER): Payer: Self-pay | Admitting: *Deleted

## 2020-06-10 DIAGNOSIS — I6523 Occlusion and stenosis of bilateral carotid arteries: Secondary | ICD-10-CM

## 2020-06-10 LAB — PROTEIN / CREATININE RATIO, URINE
Creatinine, Urine: 113 mg/dL (ref 20–275)
Protein/Creat Ratio: 6186 mg/g creat — ABNORMAL HIGH (ref 21–161)
Protein/Creatinine Ratio: 6.186 mg/mg creat — ABNORMAL HIGH (ref 0.021–0.16)
Total Protein, Urine: 699 mg/dL — ABNORMAL HIGH (ref 5–24)

## 2020-06-10 NOTE — Telephone Encounter (Signed)
Noted  

## 2020-06-10 NOTE — Telephone Encounter (Signed)
Patient's sister dropped off a 3 page letter for Dr. Caryl Bis to read about patient's health status. Letter in envelope was handed to [place].

## 2020-06-10 NOTE — Telephone Encounter (Signed)
I called and spoke with the patient and she refuses to do labs, patient stated she just did labs and she is tired of being stuck.  She stated she will call her kidney provider because she does not want labs.  Ricci Dirocco,cma

## 2020-06-10 NOTE — Telephone Encounter (Signed)
The sister dropped off the letter for you to read about the patient. I put this letter in the lab basket for your review. Brianna Dodson,cma

## 2020-06-14 DIAGNOSIS — I129 Hypertensive chronic kidney disease with stage 1 through stage 4 chronic kidney disease, or unspecified chronic kidney disease: Secondary | ICD-10-CM | POA: Diagnosis not present

## 2020-06-14 DIAGNOSIS — N2581 Secondary hyperparathyroidism of renal origin: Secondary | ICD-10-CM | POA: Diagnosis not present

## 2020-06-14 DIAGNOSIS — E1122 Type 2 diabetes mellitus with diabetic chronic kidney disease: Secondary | ICD-10-CM | POA: Diagnosis not present

## 2020-06-14 DIAGNOSIS — R809 Proteinuria, unspecified: Secondary | ICD-10-CM | POA: Diagnosis not present

## 2020-06-14 DIAGNOSIS — N184 Chronic kidney disease, stage 4 (severe): Secondary | ICD-10-CM | POA: Diagnosis not present

## 2020-06-16 DIAGNOSIS — N2581 Secondary hyperparathyroidism of renal origin: Secondary | ICD-10-CM | POA: Diagnosis not present

## 2020-06-16 DIAGNOSIS — I129 Hypertensive chronic kidney disease with stage 1 through stage 4 chronic kidney disease, or unspecified chronic kidney disease: Secondary | ICD-10-CM | POA: Diagnosis not present

## 2020-06-16 DIAGNOSIS — N184 Chronic kidney disease, stage 4 (severe): Secondary | ICD-10-CM | POA: Diagnosis not present

## 2020-06-16 DIAGNOSIS — R808 Other proteinuria: Secondary | ICD-10-CM | POA: Diagnosis not present

## 2020-06-16 DIAGNOSIS — E1122 Type 2 diabetes mellitus with diabetic chronic kidney disease: Secondary | ICD-10-CM | POA: Diagnosis not present
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
## 2020-06-17 NOTE — Telephone Encounter (Signed)
Awaiting call back from DSS.
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
## 2020-06-18 NOTE — Telephone Encounter (Signed)
Noted  

## 2020-06-28 DIAGNOSIS — E785 Hyperlipidemia, unspecified: Secondary | ICD-10-CM | POA: Diagnosis not present

## 2020-06-28 DIAGNOSIS — N184 Chronic kidney disease, stage 4 (severe): Secondary | ICD-10-CM | POA: Diagnosis not present

## 2020-06-28 DIAGNOSIS — E1165 Type 2 diabetes mellitus with hyperglycemia: Secondary | ICD-10-CM | POA: Diagnosis not present

## 2020-06-28 DIAGNOSIS — I10 Essential (primary) hypertension: Secondary | ICD-10-CM | POA: Diagnosis not present

## 2020-06-28 DIAGNOSIS — E1169 Type 2 diabetes mellitus with other specified complication: Secondary | ICD-10-CM | POA: Diagnosis not present

## 2020-07-08 ENCOUNTER — Ambulatory Visit (INDEPENDENT_AMBULATORY_CARE_PROVIDER_SITE_OTHER): Payer: Medicare Other | Admitting: Family Medicine

## 2020-07-08 ENCOUNTER — Other Ambulatory Visit: Payer: Self-pay

## 2020-07-08 ENCOUNTER — Encounter: Payer: Self-pay | Admitting: Family Medicine

## 2020-07-08 DIAGNOSIS — R413 Other amnesia: Secondary | ICD-10-CM | POA: Diagnosis not present

## 2020-07-08 DIAGNOSIS — G479 Sleep disorder, unspecified: Secondary | ICD-10-CM
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
## 2020-07-08 NOTE — Patient Instructions (Signed)
Nice to see you. We will get you referred back to your neurologist. You can try the melatonin for sleep.

## 2020-07-08 NOTE — Assessment & Plan Note (Signed)
Sounds as though the patient is getting adequate sleep.  She goes to bed quite early and wakes up at an adequate time given how early she goes to bed.  Discussed they can try melatonin to see if that would help her sleep better.
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
## 2020-07-14 DIAGNOSIS — E1122 Type 2 diabetes mellitus with diabetic chronic kidney disease: Secondary | ICD-10-CM | POA: Diagnosis not present

## 2020-07-14 DIAGNOSIS — N2581 Secondary hyperparathyroidism of renal origin: Secondary | ICD-10-CM | POA: Diagnosis not present

## 2020-07-14 DIAGNOSIS — R809 Proteinuria, unspecified: Secondary | ICD-10-CM | POA: Diagnosis not present

## 2020-07-14 DIAGNOSIS — N189 Chronic kidney disease, unspecified: Secondary | ICD-10-CM | POA: Diagnosis not present

## 2020-07-14 DIAGNOSIS — I129 Hypertensive chronic kidney disease with stage 1 through stage 4 chronic kidney disease, or unspecified chronic kidney disease: Secondary | ICD-10-CM | POA: Diagnosis not present

## 2020-07-14 DIAGNOSIS — N184 Chronic kidney disease, stage 4 (severe): Secondary | ICD-10-CM | POA: Diagnosis not present

## 2020-07-21 ENCOUNTER — Encounter (INDEPENDENT_AMBULATORY_CARE_PROVIDER_SITE_OTHER): Payer: Medicare Other | Admitting: Ophthalmology

## 2020-07-21 ENCOUNTER — Other Ambulatory Visit: Payer: Self-pay

## 2020-07-21 DIAGNOSIS — E113312 Type 2 diabetes mellitus with moderate nonproliferative diabetic retinopathy with macular edema, left eye: Secondary | ICD-10-CM | POA: Diagnosis not present

## 2020-07-21 DIAGNOSIS — H43813 Vitreous degeneration, bilateral: Secondary | ICD-10-CM | POA: Diagnosis not present

## 2020-07-21 DIAGNOSIS — I10 Essential (primary) hypertension: Secondary | ICD-10-CM

## 2020-07-21 DIAGNOSIS — H35033 Hypertensive retinopathy, bilateral: Secondary | ICD-10-CM | POA: Diagnosis not present

## 2020-07-21 DIAGNOSIS — E113391 Type 2 diabetes mellitus with moderate nonproliferative diabetic retinopathy without macular edema, right eye: Secondary | ICD-10-CM | POA: Diagnosis not present

## 2020-07-26 ENCOUNTER — Telehealth: Payer: Self-pay | Admitting: Family Medicine

## 2020-07-26 NOTE — Telephone Encounter (Signed)
Pt and her sister called and wanted to let Dr. Caryl Bis know that the melatonin is not helping with her sleeping and wanted something called in

## 2020-07-28 NOTE — Telephone Encounter (Signed)
I called and spoke with the patient and she is willing to try the trazodone.  Astryd Pearcy,cma

## 2020-07-28 NOTE — Telephone Encounter (Signed)
Would she be willing to use trazodone for her sleep?

## 2020-07-29 ENCOUNTER — Telehealth: Payer: Self-pay | Admitting: Family Medicine

## 2020-07-29 MED ORDER — ROSUVASTATIN CALCIUM 40 MG PO TABS
40.0000 mg | ORAL_TABLET | Freq: Every day | ORAL | 1 refills | Status: DC
Start: 2020-07-29 — End: 2020-10-07

## 2020-07-29 MED ORDER — TRAZODONE HCL 50 MG PO TABS
25.0000 mg | ORAL_TABLET | Freq: Every evening | ORAL | 3 refills | Status: DC | PRN
Start: 2020-07-29 — End: 2020-08-31

## 2020-07-29 NOTE — Addendum Note (Signed)
Addended by: Caryl Bis, Muneer Leider G on: 07/29/2020 09:00 AM   Modules accepted: Orders

## 2020-07-29 NOTE — Telephone Encounter (Signed)
Trazodone sent to pharmacy

## 2020-07-29 NOTE — Telephone Encounter (Signed)
Pt needs a refill on rosuvastatin (CRESTOR) 40 MG tablet sent to [facility]  They are saying that have no refills

## 2020-08-23 ENCOUNTER — Telehealth: Payer: Self-pay

## 2020-08-23 DIAGNOSIS — I480 Paroxysmal atrial fibrillation: Secondary | ICD-10-CM
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
## 2020-08-23 NOTE — Telephone Encounter (Signed)
Pt called and states that [facility] [place] [facility] a new rx for ELIQUIS 2.5 MG TABS tablet. They also state that it is going to be $1000.00 to refill? She was told to contact PCP. Pt has about 10 pills left/

## 2020-08-24 NOTE — Telephone Encounter (Signed)
I called the patient and informed her that her samples for Eliquis was available at the front to pick up.  Brianna Dodson,cma

## 2020-08-30 DIAGNOSIS — F0150 Vascular dementia without behavioral disturbance: Secondary | ICD-10-CM | POA: Diagnosis not present

## 2020-08-30 DIAGNOSIS — E1122 Type 2 diabetes mellitus with diabetic chronic kidney disease: Secondary | ICD-10-CM | POA: Diagnosis not present

## 2020-08-30 DIAGNOSIS — Z8673 Personal history of transient ischemic attack (TIA), and cerebral infarction without residual deficits: Secondary | ICD-10-CM | POA: Diagnosis not present

## 2020-08-30 DIAGNOSIS — N184 Chronic kidney disease, stage 4 (severe): Secondary | ICD-10-CM | POA: Diagnosis not present

## 2020-08-30 DIAGNOSIS — F0280 Dementia in other diseases classified elsewhere without behavioral disturbance: Secondary | ICD-10-CM | POA: Diagnosis not present

## 2020-08-30 DIAGNOSIS — I129 Hypertensive chronic kidney disease with stage 1 through stage 4 chronic kidney disease, or unspecified chronic kidney disease: Secondary | ICD-10-CM | POA: Diagnosis not present

## 2020-08-30 DIAGNOSIS — G309 Alzheimer's disease, unspecified: Secondary | ICD-10-CM | POA: Diagnosis not present

## 2020-08-30 DIAGNOSIS — G4733 Obstructive sleep apnea (adult) (pediatric): Secondary | ICD-10-CM | POA: Diagnosis not present

## 2020-08-30 DIAGNOSIS — R809 Proteinuria, unspecified: Secondary | ICD-10-CM | POA: Diagnosis not present

## 2020-08-30 DIAGNOSIS — R4189 Other symptoms and signs involving cognitive functions and awareness: Secondary | ICD-10-CM | POA: Diagnosis not present

## 2020-08-30 DIAGNOSIS — G939 Disorder of brain, unspecified: Secondary | ICD-10-CM | POA: Diagnosis not present

## 2020-08-30 DIAGNOSIS — N2581 Secondary hyperparathyroidism of renal origin: Secondary | ICD-10-CM | POA: Diagnosis not present

## 2020-08-31 ENCOUNTER — Other Ambulatory Visit: Payer: Self-pay | Admitting: Family Medicine

## 2020-09-01 DIAGNOSIS — N179 Acute kidney failure, unspecified: Secondary | ICD-10-CM | POA: Diagnosis not present

## 2020-09-01 DIAGNOSIS — I129 Hypertensive chronic kidney disease with stage 1 through stage 4 chronic kidney disease, or unspecified chronic kidney disease: Secondary | ICD-10-CM | POA: Diagnosis not present

## 2020-09-01 DIAGNOSIS — I69392 Facial weakness following cerebral infarction: Secondary | ICD-10-CM | POA: Diagnosis not present

## 2020-09-01 DIAGNOSIS — E875 Hyperkalemia: Secondary | ICD-10-CM | POA: Diagnosis not present

## 2020-09-01 DIAGNOSIS — I480 Paroxysmal atrial fibrillation: Secondary | ICD-10-CM | POA: Diagnosis not present

## 2020-09-01 DIAGNOSIS — R471 Dysarthria and anarthria: Secondary | ICD-10-CM | POA: Diagnosis not present

## 2020-09-01 DIAGNOSIS — Z90710 Acquired absence of both cervix and uterus: Secondary | ICD-10-CM | POA: Diagnosis not present

## 2020-09-01 DIAGNOSIS — E785 Hyperlipidemia, unspecified: Secondary | ICD-10-CM | POA: Diagnosis not present

## 2020-09-01 DIAGNOSIS — G309 Alzheimer's disease, unspecified: Secondary | ICD-10-CM | POA: Diagnosis not present

## 2020-09-01 DIAGNOSIS — Z95818 Presence of other cardiac implants and grafts: Secondary | ICD-10-CM | POA: Diagnosis not present

## 2020-09-01 DIAGNOSIS — E1122 Type 2 diabetes mellitus with diabetic chronic kidney disease: Secondary | ICD-10-CM | POA: Diagnosis not present

## 2020-09-01 DIAGNOSIS — N184 Chronic kidney disease, stage 4 (severe): Secondary | ICD-10-CM | POA: Diagnosis not present

## 2020-09-01 DIAGNOSIS — Z960 Presence of urogenital implants: Secondary | ICD-10-CM | POA: Diagnosis not present

## 2020-09-01 DIAGNOSIS — Z7901 Long term (current) use of anticoagulants: Secondary | ICD-10-CM | POA: Diagnosis not present

## 2020-09-01 DIAGNOSIS — I69354 Hemiplegia and hemiparesis following cerebral infarction affecting left non-dominant side: Secondary | ICD-10-CM | POA: Diagnosis not present

## 2020-09-01 DIAGNOSIS — G4733 Obstructive sleep apnea (adult) (pediatric): Secondary | ICD-10-CM | POA: Diagnosis not present

## 2020-09-01 DIAGNOSIS — E440 Moderate protein-calorie malnutrition: Secondary | ICD-10-CM | POA: Diagnosis not present

## 2020-09-01 DIAGNOSIS — I69318 Other symptoms and signs involving cognitive functions following cerebral infarction: Secondary | ICD-10-CM | POA: Diagnosis not present

## 2020-09-01 DIAGNOSIS — F0281 Dementia in other diseases classified elsewhere with behavioral disturbance: Secondary | ICD-10-CM | POA: Diagnosis not present

## 2020-09-01 DIAGNOSIS — E7800 Pure hypercholesterolemia, unspecified: Secondary | ICD-10-CM | POA: Diagnosis not present

## 2020-09-01 DIAGNOSIS — D631 Anemia in chronic kidney disease: Secondary | ICD-10-CM | POA: Diagnosis not present

## 2020-09-01 DIAGNOSIS — F0151 Vascular dementia with behavioral disturbance: Secondary | ICD-10-CM | POA: Diagnosis not present

## 2020-09-01 DIAGNOSIS — G9340 Encephalopathy, unspecified: Secondary | ICD-10-CM | POA: Diagnosis not present

## 2020-09-01 DIAGNOSIS — Z9181 History of falling: Secondary | ICD-10-CM | POA: Diagnosis not present

## 2020-09-02 DIAGNOSIS — I69392 Facial weakness following cerebral infarction: Secondary | ICD-10-CM | POA: Diagnosis not present

## 2020-09-02 DIAGNOSIS — E785 Hyperlipidemia, unspecified: Secondary | ICD-10-CM | POA: Diagnosis not present

## 2020-09-02 DIAGNOSIS — I69354 Hemiplegia and hemiparesis following cerebral infarction affecting left non-dominant side: Secondary | ICD-10-CM | POA: Diagnosis not present

## 2020-09-02 DIAGNOSIS — I639 Cerebral infarction, unspecified: Secondary | ICD-10-CM | POA: Diagnosis not present

## 2020-09-02 DIAGNOSIS — N184 Chronic kidney disease, stage 4 (severe): Secondary | ICD-10-CM | POA: Diagnosis not present

## 2020-09-02 DIAGNOSIS — I69318 Other symptoms and signs involving cognitive functions following cerebral infarction: Secondary | ICD-10-CM | POA: Diagnosis not present

## 2020-09-02 DIAGNOSIS — I480 Paroxysmal atrial fibrillation: Secondary | ICD-10-CM | POA: Diagnosis not present

## 2020-09-02 DIAGNOSIS — F0151 Vascular dementia with behavioral disturbance: Secondary | ICD-10-CM | POA: Diagnosis not present

## 2020-09-02 DIAGNOSIS — F0281 Dementia in other diseases classified elsewhere with behavioral disturbance: Secondary | ICD-10-CM | POA: Diagnosis not present

## 2020-09-02 DIAGNOSIS — G309 Alzheimer's disease, unspecified: Secondary | ICD-10-CM | POA: Diagnosis not present

## 2020-09-02 DIAGNOSIS — Z23 Encounter for immunization: Secondary | ICD-10-CM | POA: Diagnosis not present

## 2020-09-02 DIAGNOSIS — I10 Essential (primary) hypertension: Secondary | ICD-10-CM | POA: Diagnosis not present

## 2020-09-02 DIAGNOSIS — Z95818 Presence of other cardiac implants and grafts: Secondary | ICD-10-CM | POA: Diagnosis not present

## 2020-09-03 DIAGNOSIS — F0151 Vascular dementia with behavioral disturbance: Secondary | ICD-10-CM | POA: Diagnosis not present

## 2020-09-03 DIAGNOSIS — I69354 Hemiplegia and hemiparesis following cerebral infarction affecting left non-dominant side: Secondary | ICD-10-CM | POA: Diagnosis not present

## 2020-09-03 DIAGNOSIS — I69392 Facial weakness following cerebral infarction: Secondary | ICD-10-CM | POA: Diagnosis not present

## 2020-09-03 DIAGNOSIS — I69318 Other symptoms and signs involving cognitive functions following cerebral infarction: Secondary | ICD-10-CM | POA: Diagnosis not present

## 2020-09-03 DIAGNOSIS — F0281 Dementia in other diseases classified elsewhere with behavioral disturbance: Secondary | ICD-10-CM | POA: Diagnosis not present

## 2020-09-03 DIAGNOSIS — G309 Alzheimer's disease, unspecified: Secondary | ICD-10-CM | POA: Diagnosis not present

## 2020-09-06 DIAGNOSIS — F0151 Vascular dementia with behavioral disturbance: Secondary | ICD-10-CM | POA: Diagnosis not present

## 2020-09-06 DIAGNOSIS — I69318 Other symptoms and signs involving cognitive functions following cerebral infarction: Secondary | ICD-10-CM | POA: Diagnosis not present

## 2020-09-06 DIAGNOSIS — I69354 Hemiplegia and hemiparesis following cerebral infarction affecting left non-dominant side: Secondary | ICD-10-CM | POA: Diagnosis not present

## 2020-09-06 DIAGNOSIS — I69392 Facial weakness following cerebral infarction: Secondary | ICD-10-CM | POA: Diagnosis not present

## 2020-09-06 DIAGNOSIS — F0281 Dementia in other diseases classified elsewhere with behavioral disturbance: Secondary | ICD-10-CM | POA: Diagnosis not present

## 2020-09-06 DIAGNOSIS — G309 Alzheimer's disease, unspecified: Secondary | ICD-10-CM | POA: Diagnosis not present

## 2020-09-06 NOTE — Telephone Encounter (Signed)
Referral placed. Please see how much of the eliquis she currently has left.

## 2020-09-06 NOTE — Addendum Note (Signed)
Addended by: [name] on: 09/06/2020 07:07 PM   Modules accepted: Orders

## 2020-09-06 NOTE — Telephone Encounter (Signed)
Brianna Dodson, did you ever discuss a CCM referral with this patient?

## 2020-09-06 NOTE — Telephone Encounter (Signed)
I called the patient and inquired if she would like to have a visit with our pharmacist to assist with the eliquis and she agreed to meet with catie.  Brianna Dodson,cma

## 2020-09-06 NOTE — Telephone Encounter (Signed)
Hey Catie   Just an FYI I never received a referral on this patient   Thank Government social research officer

## 2020-09-07 ENCOUNTER — Telehealth: Payer: Self-pay

## 2020-09-07 DIAGNOSIS — G309 Alzheimer's disease, unspecified: Secondary | ICD-10-CM | POA: Diagnosis not present

## 2020-09-07 DIAGNOSIS — I69354 Hemiplegia and hemiparesis following cerebral infarction affecting left non-dominant side: Secondary | ICD-10-CM | POA: Diagnosis not present

## 2020-09-07 DIAGNOSIS — F0151 Vascular dementia with behavioral disturbance: Secondary | ICD-10-CM | POA: Diagnosis not present

## 2020-09-07 DIAGNOSIS — F0281 Dementia in other diseases classified elsewhere with behavioral disturbance: Secondary | ICD-10-CM | POA: Diagnosis not present

## 2020-09-07 DIAGNOSIS — I69318 Other symptoms and signs involving cognitive functions following cerebral infarction: Secondary | ICD-10-CM | POA: Diagnosis not present

## 2020-09-07 DIAGNOSIS — I69392 Facial weakness following cerebral infarction: Secondary | ICD-10-CM | POA: Diagnosis not present

## 2020-09-07 NOTE — Telephone Encounter (Signed)
I called and spoke with the patient and she stated she had at least 1 month left.  Nashonda Limberg,cma

## 2020-09-07 NOTE — Chronic Care Management (AMB) (Signed)
  Chronic Care Management   Note  09/07/2020 Name: Morganna Styles MRN: [id number] DOB: 1934-04-02  Ylonda Storr is a 87 y.o. year old female who is a primary care patient of Caryl Bis, Angela Adam, MD. I reached out to [name] by phone today in response to a referral sent by Ms. Fabiana Malveaux's PCP, [name], MD     Ms. Utecht was given information about Chronic Care Management services today including:  1. CCM service includes personalized support from designated clinical staff supervised by her physician, including individualized plan of care and coordination with other care providers 2. 24/7 contact phone numbers for assistance for urgent and routine care needs. 3. Service will only be billed when office clinical staff spend 20 minutes or more in a month to coordinate care. 4. Only one practitioner may furnish and bill the service in a calendar month. 5. The patient may stop CCM services at any time (effective at the end of the month) by phone call to the office staff. 6. The patient will be responsible for cost sharing (co-pay) of up to 20% of the service fee (after annual deductible is met).  Patient agreed to services and verbal consent obtained.   Follow up plan: Telephone appointment with care management team member scheduled for:09/09/2020  Noreene Larsson, [address], [place], [place] [id number] Direct Dial: [phone] [email or web address]: [email or web address]

## 2020-09-08 ENCOUNTER — Other Ambulatory Visit: Payer: Self-pay

## 2020-09-08 ENCOUNTER — Ambulatory Visit (INDEPENDENT_AMBULATORY_CARE_PROVIDER_SITE_OTHER): Payer: Medicare Other | Admitting: Family Medicine

## 2020-09-08 ENCOUNTER — Encounter: Payer: Self-pay | Admitting: Family Medicine

## 2020-09-08 DIAGNOSIS — I69318 Other symptoms and signs involving cognitive functions following cerebral infarction: Secondary | ICD-10-CM | POA: Diagnosis not present

## 2020-09-08 DIAGNOSIS — F0150 Vascular dementia without behavioral disturbance: Secondary | ICD-10-CM

## 2020-09-08 DIAGNOSIS — N183 Chronic kidney disease, stage 3 unspecified: Secondary | ICD-10-CM | POA: Diagnosis not present

## 2020-09-08 DIAGNOSIS — G309 Alzheimer's disease, unspecified: Secondary | ICD-10-CM

## 2020-09-08 DIAGNOSIS — F0280 Dementia in other diseases classified elsewhere without behavioral disturbance: Secondary | ICD-10-CM

## 2020-09-08 DIAGNOSIS — I69392 Facial weakness following cerebral infarction: Secondary | ICD-10-CM | POA: Diagnosis not present

## 2020-09-08 DIAGNOSIS — I69354 Hemiplegia and hemiparesis following cerebral infarction affecting left non-dominant side: Secondary | ICD-10-CM | POA: Diagnosis not present

## 2020-09-08 DIAGNOSIS — F0151 Vascular dementia with behavioral disturbance: Secondary | ICD-10-CM | POA: Diagnosis not present

## 2020-09-08 DIAGNOSIS — E1122 Type 2 diabetes mellitus with diabetic chronic kidney disease: Secondary | ICD-10-CM | POA: Diagnosis not present

## 2020-09-08 DIAGNOSIS — F0281 Dementia in other diseases classified elsewhere with behavioral disturbance: Secondary | ICD-10-CM | POA: Diagnosis not present

## 2020-09-08 MED ORDER — AMLODIPINE BESYLATE 10 MG PO TABS
10.0000 mg | ORAL_TABLET | Freq: Every day | ORAL | 0 refills | Status: DC
Start: 2020-09-08 — End: 2020-12-30

## 2020-09-08 MED ORDER — GLIMEPIRIDE 2 MG PO TABS: 2.0000 mg | ORAL_TABLET | Freq: Every day | ORAL | 0 refills | Status: DC

## 2020-09-08 NOTE — Progress Notes (Signed)
[name], MD Phone: [phone]  Brianna Dodson is a 87 y.o. female who presents today for f/u.  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  She has been advised not to drive.  They started her on Namenda and she and her sister both note there has been some improvement.  She also has speech therapy, physical therapy, and occupational therapy coming out.  She also has a nurse coming out as well.  It sounds as though the social worker will be evaluating her as well.  Diabetes: Typically 140s-150s.  She is taking glimepiride 2 mg once daily.  No hypoglycemia.  No polyuria or polydipsia.  Atrial fibrillation: Continues on Eliquis.  She has about a month of this left.  We have referred her to our clinical pharmacist for patient assistance on Eliquis.  Social History   Tobacco Use  Smoking Status Never Smoker  Smokeless Tobacco Never Used    Current Outpatient Medications on File Prior to Visit  Medication Sig Dispense Refill  . carvedilol (COREG) 6.25 MG tablet Take 1 tablet (6.25 mg total) by mouth 2 (two) times daily with a meal. 180 tablet 1  . ELIQUIS 2.5 MG TABS tablet SMARTSIG:1 Tablet(s) By Mouth Every 12 Hours 60 tablet 1  . ezetimibe (ZETIA) 10 MG tablet Take 1 tablet (10 mg total) by mouth daily. 90 tablet 3  . Ferrous Sulfate (IRON) 28 MG TABS Take 28 mg by mouth daily at 12 noon.    Marland Kitchen glucose blood (ACCU-CHEK GUIDE) test strip USE TO CHECK BLOOD SUGARS TWICE DAILY. E11.9 100 each 0  . hydrALAZINE (APRESOLINE) 50 MG tablet Take 50 mg by mouth in the morning and at bedtime.    . Lancets (ONETOUCH ULTRASOFT) lancets Use as instructed 100 each 6  . losartan (COZAAR) 50 MG tablet Take 1 tablet (50 mg total) by mouth daily. 90 tablet 3  . memantine (NAMENDA) 5 MG tablet Take 5 mg by mouth 2 (two) times daily.    . rosuvastatin (CRESTOR) 40 MG tablet Take 1 tablet (40 mg total) by mouth daily. 90 tablet 1  . Vitamin D,  Ergocalciferol, (DRISDOL) 50000 units CAPS capsule Take 50,000 Units by mouth every 30 (thirty) days.  1   No current facility-administered medications on file prior to visit.     ROS see history of present illness  Objective  Physical Exam Vitals:   09/08/20 1033  BP: 130/60  Pulse: 67  Temp: 97.9 F (36.6 C)  SpO2: 99%    BP Readings from Last 3 Encounters:  09/08/20 130/60  07/08/20 140/70  06/09/20 140/80   Wt Readings from Last 3 Encounters:  09/08/20 174 lb (78.9 kg)  07/08/20 174 lb 9.6 oz (79.2 kg)  06/09/20 171 lb 6.4 oz (77.7 kg)    Physical Exam Constitutional:      General: She is not in acute distress.    Appearance: She is not diaphoretic.  Cardiovascular:     Rate and Rhythm: Normal rate and regular rhythm.     Heart sounds: Normal heart sounds.  Pulmonary:     Effort: Pulmonary effort is normal.     Breath sounds: Normal breath sounds.  Musculoskeletal:        General: No edema.     Right lower leg: No edema.     Left lower leg: No edema.  Skin:    General: Skin is warm and dry.  Neurological:     Mental Status:  She is alert.      Assessment/Plan: Please see individual problem list.  Problem List Items Addressed This Visit    DM type 2 (diabetes mellitus, type 2) ([place])    A1c adequately controlled on last check.  She will continue glimepiride 2 mg once daily.  She will continue to see endocrinology.      Relevant Medications   glimepiride (AMARYL) 2 MG tablet   Mixed Alzheimer's and vascular dementia ([place])    They note improvement with Namenda.  She will continue this.  She will continue to see neurology.  She will continue with home health.  Advised not to drive.      Relevant Medications   hydrALAZINE (APRESOLINE) 50 MG tablet   memantine (NAMENDA) 5 MG tablet   amLODipine (NORVASC) 10 MG tablet      Medication list was brought up-to-date.    This visit occurred during the SARS-CoV-2 public health emergency.  Safety  protocols were in place, including screening questions prior to the visit, additional usage of staff PPE, and extensive cleaning of exam room while observing appropriate contact time as indicated for disinfecting solutions.    [name], MD [place]

## 2020-09-08 NOTE — Patient Instructions (Addendum)
Nice to see you. It looks like he may not have follow-up with endocrinology.  Please get scheduled with them. Please let us know if you do not hear from the clinical pharmacist.  If you get within a week of running out of your Eliquis please contact us as we can provide you with samples.

## 2020-09-08 NOTE — Assessment & Plan Note (Signed)
They note improvement with Namenda.  She will continue this.  She will continue to see neurology.  She will continue with home health.  Advised not to drive.

## 2020-09-08 NOTE — Telephone Encounter (Signed)
Noted.  Patient has a visit with Catie tomorrow.

## 2020-09-08 NOTE — Assessment & Plan Note (Signed)
A1c adequately controlled on last check.  She will continue glimepiride 2 mg once daily.  She will continue to see endocrinology.

## 2020-09-09 ENCOUNTER — Ambulatory Visit: Payer: Medicare Other | Admitting: Pharmacist

## 2020-09-09 DIAGNOSIS — N185 Chronic kidney disease, stage 5: Secondary | ICD-10-CM | POA: Diagnosis not present

## 2020-09-09 DIAGNOSIS — G309 Alzheimer's disease, unspecified: Secondary | ICD-10-CM

## 2020-09-09 DIAGNOSIS — I10 Essential (primary) hypertension: Secondary | ICD-10-CM

## 2020-09-09 DIAGNOSIS — F0150 Vascular dementia without behavioral disturbance: Secondary | ICD-10-CM

## 2020-09-09 DIAGNOSIS — I480 Paroxysmal atrial fibrillation: Secondary | ICD-10-CM

## 2020-09-09 DIAGNOSIS — N183 Chronic kidney disease, stage 3 unspecified: Secondary | ICD-10-CM | POA: Diagnosis not present

## 2020-09-09 DIAGNOSIS — E785 Hyperlipidemia, unspecified: Secondary | ICD-10-CM | POA: Diagnosis not present

## 2020-09-09 DIAGNOSIS — E1122 Type 2 diabetes mellitus with diabetic chronic kidney disease: Secondary | ICD-10-CM

## 2020-09-09 DIAGNOSIS — F0280 Dementia in other diseases classified elsewhere without behavioral disturbance: Secondary | ICD-10-CM | POA: Diagnosis not present

## 2020-09-09 DIAGNOSIS — I6930 Unspecified sequelae of cerebral infarction: Secondary | ICD-10-CM

## 2020-09-09 NOTE — Patient Instructions (Signed)
Brianna Dodson,   It was great talking to you today!   I am here to help out with your medications. Please call me if you have questions or concerns about anything related to your medications.   Below is a summary of what we discussed today.   Take care! Catie Darnelle Maffucci, PharmD [phone]  Visit Information  PATIENT GOALS:  Goals Addressed              This Visit's Progress     Patient Stated   .  Medication Management (pt-stated)        Patient Goals/Self-Care Activities . Over the next 90 days, patient will:  - take medications as prescribed check blood pressure daily, document, and provide at future appointments       Consent to CCM Services: Ms. Emile was given information about Chronic Care Management services today including:  1. CCM service includes personalized support from designated clinical staff supervised by her physician, including individualized plan of care and coordination with other care providers 2. 24/7 contact phone numbers for assistance for urgent and routine care needs. 3. Service will only be billed when office clinical staff spend 20 minutes or more in a month to coordinate care. 4. Only one practitioner may furnish and bill the service in a calendar month. 5. The patient may stop CCM services at any time (effective at the end of the month) by phone call to the office staff. 6. The patient will be responsible for cost sharing (co-pay) of up to 20% of the service fee (after annual deductible is met).  Patient agreed to services and verbal consent obtained.   The patient verbalized understanding of instructions, educational materials, and care plan provided today and agreed to receive a mailed copy of patient instructions, educational materials, and care plan.   Plan: Telephone follow up appointment with care management team member scheduled for:  ~ 4 weeks  Catie Darnelle Maffucci, PharmD, [place], CPP Clinical Pharmacist [facility] [place]: Patient Care Plan: Medication Management    Problem Identified: S/p CVA, vascular dementia, CKD, T2DM     Long-Range Goal: Medication Management   Start Date: 09/09/2020  This Visit's Progress: On track  Priority: High  Note:   Current Barriers:  . Unable to independently afford treatment regimen . Unable to self administer medications as prescribed  Pharmacist Clinical Goal(s):  Marland Kitchen Over the next 90 days,patient will verbalize ability to afford treatment regimen . Over the next 90 days, adhere to prescribed medication regimen through collaboration with PharmD and provider.   Interventions: . 1:1 collaboration with Brianna Haven, MD regarding development and update of comprehensive plan of care as evidenced by provider attestation and co-signature . Inter-disciplinary care team collaboration (see longitudinal plan of care) . Comprehensive medication review performed; medication list updated in electronic medical record  SDOH: . Patient lives at home. Aphasia s/p multiple strokes. Family helps with care. Granddaughter, Brianna Dodson, helps with medication review today.  . Receiving home health - > speech, physical therapy, occupational therapy, possibly SW. Moving forward, will evaluate for assistance from RN CM on team for complex case management.  Healthcare Management: . Reports that [name] has been providing pill packing for her for the past few weeks, but this will end at end of the month. Will follow for support with [facility] Dose P[facility] vs home pill box fills w/ [place] team.   Diabetes: . Uncontrolled, but more relaxed goal of <8% likely appropriate  given comorbidities; current treatment: glimepiride 2 mg QAM (using 2 1 mg tabs); follows w/ [facility]  . Denies hypoglycemic symptoms . Continue to monitor for episodes of hypoglycemia. Renal function limits use of options other than insulin. . Dr. Caryl Bis encouraged to call  endocrinology to schedule f/u. Reiterated to patient.    Hypertension and Atrial Fibrillation with advanced CKD: . Uncontrolled and inappropriately managed; current treatment: amlodipine 10 mg daily, carvedilol 6.25 mg BID, hydralazine 50 mg BID, losartan 50 mg - patient has been taking this one concurrently with lisinopril, lisinopril was just discontinued by Dr. Juleen China nephrology d/t worsening renal function.  . Anticoagulant regimen: Eliquis 2.5 mg BID - reports today that her insurance is not covering Eliquis. Using samples.  . Current home readings: granddaughter reports readings of ~ 140/60s . Patient does report feeling "sick" since 2/28 when hydralazine (and memantine) were added. Upon further questioning, she describes as "fatigued and tired" . Contacted Dr. Assunta Gambles office to report that patient had been on concurrent lisinopril and losartan and is still taking losartan. Reported patient's fatigue as well. Received call back from [facility] that Dr. Juleen China advised to continue losartan 50 mg daily at this time. Notified patient.  . Contacted [facility] to ask what rejection notice is for Eliquis. Noted that "not covered, use warfarin or Xarelto".  . Contacted Dr. Lenord Carbo Drane's office for update on status of formulary exception request to allow to continue Eliquis therapy, as warfarin would be unsafe given patient's bleed risk and Xarelto is inappropriate given renal function. Waiting call back. Notified patient.   Hyperlipidemia and secondary ASCVD prevention . Controlled per last lipid panel; current treatment: rosuvastatin 40 mg, ezetimibe 10 mg . Antiplatelet regimen: none given concurrent DOAC . Recommend to continue current regimen at this time.  Vascular Dementia and Cognitive Impairment: . Uncontrolled; current regimen: memantine 5 mg BID - had started at 5 mg QPM and increased to BID after 1 week as prescribed o Decision to avoid donepezil d/t risk of bradycardia . Patient does  report feeling "sick" since 2/28 when memantine (and hydralazine) were added. Upon further questioning, she describes as "fatigued and tired".  Kandice Robinsons Dr. Trena Platt office, left voicemail reporting patient's increased fatigue. Await call back. Notified patient. . Encouraged to continue current regimen at this time   Patient Goals/Self-Care Activities . Over the next 90 days, patient will:  - take medications as prescribed check blood pressure daily, document, and provide at future appointments  Follow Up Plan: Telephone follow up appointment with care management team member scheduled for: 4 weeks

## 2020-09-09 NOTE — Chronic Care Management (AMB) (Signed)
Chronic Care Management Pharmacy Note  09/09/2020 Name:  Brianna Dodson MRN:  [id number] DOB:  1934/06/20  Subjective: Brianna Dodson is an 87 y.o. year old female who is a primary patient of Sonnenberg, Angela Adam, MD.  The CCM team was consulted for assistance with disease management and care coordination needs.    Engaged with patient by telephone for follow up visit in response to provider referral for pharmacy case management and/or care coordination services. Her granddaughter, Rayna Sexton, was also present on our call today  Consent to Services:  The patient was given information about Chronic Care Management services, agreed to services, and gave verbal consent prior to initiation of services.  Please see initial visit note for detailed documentation.   Patient Care Team: [name], MD as PCP - General (Family Medicine) De Hollingshead, RPH-CPP (Pharmacist)  Recent office visits:  3/9 - Dr. Caryl Bis, no changes made. Encouraged to schedule f/u with endocrinology   Recent consult visits:  3/3 - Clabe Seal cardiology; no changes made at that visit, check BP; office working on formulary exception for Eliquis  2/28 - Dr Manuella Ghazi neurology - no driving, started memantine 5 mg for 1 week then to increase to BID  2/28 - Dr. Juleen China - stopped lisinopril, started hydralazine, iron supplement  Hospital visits: None in previous 6 months  Objective:  Lab Results  Component Value Date   CREATININE 3.26 (H) 06/09/2020   BUN 29 (H) 06/09/2020   GFR 12.51 (LL) 06/09/2020   GFRNONAA 23 (L) 08/22/2019   GFRAA 27 (L) 08/22/2019   NA 139 06/09/2020   K 5.1 06/09/2020   CALCIUM 8.6 06/09/2020   CO2 27 06/09/2020    Lab Results  Component Value Date/Time   HGBA1C 7.9 (H) 04/21/2020 10:04 AM   HGBA1C 7.5 (H) 08/21/2019 05:34 AM   HGBA1C 6.9 (H) 11/10/2013 05:22 AM   HGBA1C 7.1 (H) 11/09/2013 03:40 PM   GFR 12.51 (LL) 06/09/2020 11:45 AM   GFR 16.76 (L) 04/21/2020 10:04 AM    MICROALBUR 87.6 (H) 06/11/2019 09:47 AM   MICROALBUR 12.3 (H) 08/03/2014 10:28 AM    Last diabetic Eye exam:  Lab Results  Component Value Date/Time   HMDIABEYEEXA No Retinopathy 09/15/2014 12:00 AM    Last diabetic Foot exam: No results found for: HMDIABFOOTEX   Lab Results  Component Value Date   CHOL 126 10/21/2019   HDL 37.30 (L) 10/21/2019   LDLCALC 63 10/21/2019   LDLDIRECT 98.0 01/24/2018   TRIG 129.0 10/21/2019   CHOLHDL 3 10/21/2019    Hepatic Function Latest Ref Rng & Units 06/09/2020 11/26/2019 08/20/2019  Total Protein 6.0 - 8.3 g/dL 7.2 6.8 7.1  Albumin 3.5 - 5.2 g/dL 3.9 4.1 3.5  AST 0 - 37 U/L 21 18 25   ALT 0 - 35 U/L 20 15 15   Alk Phosphatase 39 - 117 U/L 48 46 46  Total Bilirubin 0.2 - 1.2 mg/dL 0.6 0.4 0.9  Bilirubin, Direct 0.0 - 0.3 mg/dL - - -    Lab Results  Component Value Date/Time   TSH 2.55 06/09/2020 11:45 AM   TSH 2.32 04/21/2020 10:04 AM    CBC Latest Ref Rng & Units 06/09/2020 12/04/2019 11/26/2019  WBC 4.0 - 10.5 K/uL 4.1 4.0 3.4(L)  Hemoglobin 12.0 - 15.0 g/dL 9.4(L) 9.9(L) 9.3(L)  Hematocrit 36.0 - 46.0 % 27.4(L) 28.7(L) 27.1(L)  Platelets 150.0 - 400.0 K/uL 161.0 146.0(L) 150.0    Lab Results  Component Value Date/Time   VD25OH 28.70 (  L) 06/09/2020 11:45 AM   VD25OH 41.14 01/06/2016 09:23 AM    Clinical ASCVD: Yes    Depression screen [facility] 2/9 09/08/2020 06/09/2020 01/26/2020  Decreased Interest 0 0 0  Down, Depressed, Hopeless 0 0 0  PHQ - 2 Score 0 0 0  Altered sleeping - - -  Tired, decreased energy - - -  Change in appetite - - -  Feeling bad or failure about yourself  - - -  Trouble concentrating - - -  Moving slowly or fidgety/restless - - -  Suicidal thoughts - - -  PHQ-9 Score - - -      Social History   Tobacco Use  Smoking Status Never Smoker  Smokeless Tobacco Never Used   BP Readings from Last 3 Encounters:  09/08/20 130/60  07/08/20 140/70  06/09/20 140/80   Pulse Readings from Last 3 Encounters:   09/08/20 67  07/08/20 66  06/09/20 (!) 57   Wt Readings from Last 3 Encounters:  09/08/20 174 lb (78.9 kg)  07/08/20 174 lb 9.6 oz (79.2 kg)  06/09/20 171 lb 6.4 oz (77.7 kg)    Assessment/Interventions: Review of patient past medical history, allergies, medications, health status, including review of consultants reports, laboratory and other test data, was performed as part of comprehensive evaluation and provision of chronic care management services.   SDOH:  (Social Determinants of Health) assessments and interventions performed: Yes SDOH Interventions   Flowsheet Row Most Recent Value  SDOH Interventions   Financial Strain Interventions Other (Comment)  [navigating Eliquis assistance]      CCM Care Plan  Allergies  Allergen Reactions  . Nsaids     CKD stage III - Avoid all nephrotoxic drugs    Medications Reviewed Today    Reviewed by De Hollingshead, RPH-CPP (Pharmacist) on 09/09/20 at 1045  Med List Status: <None>  Medication Order Taking? Sig Documenting Provider Last Dose Status Informant  amLODipine (NORVASC) 10 MG tablet [id number] Yes Take 1 tablet (10 mg total) by mouth daily. [name], MD Taking Active            Med Note Darnelle Maffucci, Arville Lime   Thu Sep 09, 2020 10:42 AM) QAM  carvedilol (COREG) 6.25 MG tablet [id number] Yes Take 1 tablet (6.25 mg total) by mouth 2 (two) times daily with a meal. [name], MD Taking Active   ELIQUIS 2.5 MG TABS tablet [id number] Yes SMARTSIG:1 Tablet(s) By Mouth Every 12 Hours [name], MD Taking Active   ezetimibe (ZETIA) 10 MG tablet [id number] Yes Take 1 tablet (10 mg total) by mouth daily. [name], MD Taking Active   Ferrous Sulfate (IRON) 28 MG TABS [id number] Yes Take 28 mg by mouth daily at 12 noon. [provider] Taking Active Family Member  glimepiride (AMARYL) 2 MG tablet [id number] Yes Take 1 tablet (2 mg total) by mouth daily. [name], MD Taking Active             Med Note Darnelle Maffucci, Maralyn Sago Sep 09, 2020 10:43 AM) 2 1 mg tabs  glucose blood (ACCU-CHEK GUIDE) test strip [id number] Yes USE TO CHECK BLOOD SUGARS TWICE DAILY. E11.9 [name], MD Taking Active   hydrALAZINE (APRESOLINE) 50 MG tablet [id number] Yes Take 50 mg by mouth 2 (two) times daily. [provider] Taking Active   Lancets Glory Rosebush ULTRASOFT) lancets [id number] Yes Use as instructed [name], MD Taking Active   losartan (COZAAR) 50 MG  tablet [id number] Yes Take 1 tablet (50 mg total) by mouth daily. [name], MD Taking Active            Med Note Darnelle Maffucci, Maralyn Sago Sep 09, 2020 10:43 AM) QAM  memantine (NAMENDA) 5 MG tablet [id number] Yes Take 5 mg by mouth 2 (two) times daily. [provider] Taking Active   Multiple Vitamin (MULTIVITAMIN ADULT PO) [id number] Yes Take 1 tablet by mouth daily. [provider] Taking Active   rosuvastatin (CRESTOR) 40 MG tablet [id number] Yes Take 1 tablet (40 mg total) by mouth daily. [name], MD Taking Active   Vitamin D, Ergocalciferol, (DRISDOL) 50000 units CAPS capsule [id number]  Take 50,000 Units by mouth every 30 (thirty) days. [provider]  Active Family Member          Patient Active Problem List   Diagnosis Date Noted  . Sleeping difficulty 07/08/2020  . Fatigue 06/09/2020  . Mixed Alzheimer's and vascular dementia ([place]) 06/09/2020  . Coccydynia 01/26/2020  . Leukopenia 12/08/2019  . Muscle strain 12/08/2019  . Pedal edema 11/26/2019  . Dysarthria and anarthria 11/07/2019  . Atrial fibrillation ([place]) 10/21/2019  . Nocturia 10/21/2019  . Seborrheic keratosis 10/21/2019  . Carotid stenosis 08/27/2019  . Thyroid nodule 08/27/2019  . Status post placement of implantable loop recorder 08/07/2019  . Anemia 04/18/2019  . Aphasia 04/12/2019  . Benign hypertensive renal disease 04/07/2019  . Hyperparathyroidism due to renal insufficiency ([place])  04/07/2019  . 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 Proteinuria 04/07/2019  . History of stroke with current residual effects 03/15/2019  . Positional numbness and tingling in both hands, ulnar aspect 09/19/2018  . Hyperlipidemia associated with type 2 diabetes mellitus ([place]) 07/25/2018  . Contusion of left shoulder 04/11/2018  . Itching 10/23/2017  . Nevus 04/17/2017  . CKD stage 4 due to type 2 diabetes mellitus ([place]) 02/02/2015  . Vitamin D deficiency 02/02/2015  . Obesity (BMI 30-39.9) 08/03/2014  . DM type 2 (diabetes mellitus, type 2) ([place]) 01/29/2014  . Essential hypertension 01/13/2014  . HLD (hyperlipidemia) 01/13/2014    Immunization History  Administered Date(s) Administered  . PFIZER(Purple Top)SARS-COV-2 Vaccination 07/25/2019, 08/15/2019    Conditions to be addressed/monitored:  Hypertension, Hyperlipidemia, Atrial Fibrillation and Chronic Kidney Disease  Care Plan : Medication Management  Updates made by De Hollingshead, RPH-CPP since 09/09/2020 12:00 AM    Problem: S/p CVA, vascular dementia, CKD, T2DM     Long-Range Goal: Medication Management   Start Date: 09/09/2020  This Visit's Progress: On track  Priority: High  Note:   Current Barriers:  . Unable to independently afford treatment regimen . Unable to self administer medications as prescribed  Pharmacist Clinical Goal(s):  Marland Kitchen Over the next 90 days,patient will verbalize ability to afford treatment regimen . Over the next 90 days, adhere to prescribed medication regimen through collaboration with PharmD and provider.   Interventions: . 1:1 collaboration with [name], MD regarding development and update of comprehensive plan of care as evidenced by provider attestation and co-signature . Inter-disciplinary care team collaboration (see longitudinal plan of care) . Comprehensive medication review performed;  medication list updated in electronic medical record  SDOH: . Patient lives at home. Aphasia s/p multiple strokes. Family helps with care. Granddaughter, Rayna Sexton, helps with medication review today.  . Receiving home health - > speech, physical therapy, occupational therapy, possibly SW. Moving forward,  will evaluate for assistance from RN CM on team for complex case management.  Healthcare Management: . Reports that [name] has been providing pill packing for her for the past few weeks, but this will end at end of the month. Will follow for support with [facility] vs home pill box fills w/ [facility] team.   Diabetes: . Uncontrolled, but more relaxed goal of <8% likely appropriate given comorbidities; current treatment: glimepiride 2 mg QAM (using 2 1 mg tabs); follows w/ [facility]  . Denies hypoglycemic symptoms . Continue to monitor for episodes of hypoglycemia. Renal function limits use of options other than insulin. . Dr. Caryl Bis encouraged to call endocrinology to schedule f/u. Reiterated to patient.    Hypertension and Atrial Fibrillation with advanced CKD: . Uncontrolled and inappropriately managed; current treatment: amlodipine 10 mg daily, carvedilol 6.25 mg BID, hydralazine 50 mg BID, losartan 50 mg - patient has been taking this one concurrently with lisinopril, lisinopril was just discontinued by Dr. Juleen China nephrology d/t worsening renal function.  . Anticoagulant regimen: Eliquis 2.5 mg BID - reports today that her insurance is not covering Eliquis. Using samples.  . Current home readings: granddaughter reports readings of ~ 140/60s . Patient does report feeling "sick" since 2/28 when hydralazine (and memantine) were added. Upon further questioning, she describes as "fatigued and tired" . Contacted Dr. Assunta Gambles office to report that patient had been on concurrent lisinopril and losartan and is still taking losartan. Reported patient's fatigue as well. Received call back  from [facility] that Dr. Juleen China advised to continue losartan 50 mg daily at this time. Notified patient.  . Contacted [facility] to ask what rejection notice is for Eliquis. Noted that "not covered, use warfarin or Xarelto".  . Contacted Dr. Lenord Carbo Drane's office for update on status of formulary exception request to allow to continue Eliquis therapy, as warfarin would be unsafe given patient's bleed risk and Xarelto is inappropriate given renal function. Waiting call back. Notified patient.   Hyperlipidemia and secondary ASCVD prevention . Controlled per last lipid panel; current treatment: rosuvastatin 40 mg, ezetimibe 10 mg . Antiplatelet regimen: none given concurrent DOAC . Recommend to continue current regimen at this time.  Vascular Dementia and Cognitive Impairment: . Uncontrolled; current regimen: memantine 5 mg BID - had started at 5 mg QPM and increased to BID after 1 week as prescribed o Decision to avoid donepezil d/t risk of bradycardia . Patient does report feeling "sick" since 2/28 when memantine (and hydralazine) were added. Upon further questioning, she describes as "fatigued and tired".  Kandice Robinsons Dr. Trena Platt office, left voicemail reporting patient's increased fatigue. Await call back. Notified patient. . Encouraged to continue current regimen at this time   Patient Goals/Self-Care Activities . Over the next 90 days, patient will:  - take medications as prescribed check blood pressure daily, document, and provide at future appointments  Follow Up Plan: Telephone follow up appointment with care management team member scheduled for: 4 weeks      Medication Assistance: Working on [employer] accessibility  Patient's preferred pharmacy [facility]:  [id number] [place] [address]Phone: [phone]: [place] [address] [place]Phone: [phone]: [place] Follow Up Patient Decision:  Patient agrees to Care Plan and Follow-up.  Plan: Telephone follow up appointment  with care management team member scheduled for:  ~ 4 weeks  Catie Darnelle Maffucci, PharmD, [place], [place] Clinical Pharmacist [employer] [facility] [place]

## 2020-09-10 ENCOUNTER — Other Ambulatory Visit: Payer: Self-pay

## 2020-09-10 ENCOUNTER — Inpatient Hospital Stay (HOSPITAL_COMMUNITY)
Admission: EM | Admit: 2020-09-10 | Discharge: 2020-09-14 | DRG: 065 | Disposition: A | Discharge status: Home Health | Payer: Medicare Other | Attending: Internal Medicine | Admitting: Internal Medicine

## 2020-09-10 ENCOUNTER — Emergency Department (HOSPITAL_COMMUNITY): Payer: Medicare Other

## 2020-09-10 ENCOUNTER — Encounter (HOSPITAL_COMMUNITY): Payer: Self-pay | Admitting: Family Medicine

## 2020-09-10 DIAGNOSIS — N189 Chronic kidney disease, unspecified: Secondary | ICD-10-CM | POA: Diagnosis not present

## 2020-09-10 DIAGNOSIS — I444 Left anterior fascicular block: Secondary | ICD-10-CM | POA: Diagnosis present

## 2020-09-10 DIAGNOSIS — I6782 Cerebral ischemia: Secondary | ICD-10-CM | POA: Diagnosis not present

## 2020-09-10 DIAGNOSIS — E1129 Type 2 diabetes mellitus with other diabetic kidney complication: Secondary | ICD-10-CM

## 2020-09-10 DIAGNOSIS — I63411 Cerebral infarction due to embolism of right middle cerebral artery: Principal | ICD-10-CM | POA: Diagnosis present

## 2020-09-10 DIAGNOSIS — E440 Moderate protein-calorie malnutrition: Secondary | ICD-10-CM | POA: Insufficient documentation

## 2020-09-10 DIAGNOSIS — I129 Hypertensive chronic kidney disease with stage 1 through stage 4 chronic kidney disease, or unspecified chronic kidney disease: Secondary | ICD-10-CM | POA: Diagnosis present

## 2020-09-10 DIAGNOSIS — R471 Dysarthria and anarthria: Secondary | ICD-10-CM | POA: Diagnosis present

## 2020-09-10 DIAGNOSIS — E870 Hyperosmolality and hypernatremia: Secondary | ICD-10-CM | POA: Diagnosis present

## 2020-09-10 DIAGNOSIS — R29810 Facial weakness: Secondary | ICD-10-CM | POA: Diagnosis present

## 2020-09-10 DIAGNOSIS — R531 Weakness: Secondary | ICD-10-CM | POA: Diagnosis not present

## 2020-09-10 DIAGNOSIS — E119 Type 2 diabetes mellitus without complications: Secondary | ICD-10-CM

## 2020-09-10 DIAGNOSIS — D638 Anemia in other chronic diseases classified elsewhere: Secondary | ICD-10-CM

## 2020-09-10 DIAGNOSIS — R9082 White matter disease, unspecified: Secondary | ICD-10-CM | POA: Diagnosis present

## 2020-09-10 DIAGNOSIS — I69354 Hemiplegia and hemiparesis following cerebral infarction affecting left non-dominant side: Secondary | ICD-10-CM | POA: Diagnosis not present

## 2020-09-10 DIAGNOSIS — I639 Cerebral infarction, unspecified: Secondary | ICD-10-CM | POA: Diagnosis not present

## 2020-09-10 DIAGNOSIS — R2990 Unspecified symptoms and signs involving the nervous system: Secondary | ICD-10-CM | POA: Diagnosis not present

## 2020-09-10 DIAGNOSIS — Z79899 Other long term (current) drug therapy: Secondary | ICD-10-CM

## 2020-09-10 DIAGNOSIS — Z833 Family history of diabetes mellitus: Secondary | ICD-10-CM | POA: Diagnosis not present

## 2020-09-10 DIAGNOSIS — E559 Vitamin D deficiency, unspecified: Secondary | ICD-10-CM | POA: Diagnosis present

## 2020-09-10 DIAGNOSIS — G309 Alzheimer's disease, unspecified: Secondary | ICD-10-CM | POA: Diagnosis not present

## 2020-09-10 DIAGNOSIS — I6300 Cerebral infarction due to thrombosis of unspecified precerebral artery: Secondary | ICD-10-CM | POA: Diagnosis not present

## 2020-09-10 DIAGNOSIS — R29704 NIHSS score 4: Secondary | ICD-10-CM | POA: Diagnosis present

## 2020-09-10 DIAGNOSIS — I6389 Other cerebral infarction: Secondary | ICD-10-CM | POA: Diagnosis not present

## 2020-09-10 DIAGNOSIS — Z823 Family history of stroke: Secondary | ICD-10-CM

## 2020-09-10 DIAGNOSIS — N184 Chronic kidney disease, stage 4 (severe): Secondary | ICD-10-CM | POA: Diagnosis present

## 2020-09-10 DIAGNOSIS — R4781 Slurred speech: Secondary | ICD-10-CM | POA: Diagnosis not present

## 2020-09-10 DIAGNOSIS — G9389 Other specified disorders of brain: Secondary | ICD-10-CM | POA: Diagnosis present

## 2020-09-10 DIAGNOSIS — F0280 Dementia in other diseases classified elsewhere without behavioral disturbance: Secondary | ICD-10-CM | POA: Diagnosis present

## 2020-09-10 DIAGNOSIS — Z7901 Long term (current) use of anticoagulants: Secondary | ICD-10-CM

## 2020-09-10 DIAGNOSIS — E785 Hyperlipidemia, unspecified: Secondary | ICD-10-CM | POA: Diagnosis present

## 2020-09-10 DIAGNOSIS — I10 Essential (primary) hypertension: Secondary | ICD-10-CM | POA: Diagnosis present

## 2020-09-10 DIAGNOSIS — Z841 Family history of disorders of kidney and ureter: Secondary | ICD-10-CM | POA: Diagnosis not present

## 2020-09-10 DIAGNOSIS — N183 Chronic kidney disease, stage 3 unspecified: Secondary | ICD-10-CM | POA: Diagnosis not present

## 2020-09-10 DIAGNOSIS — E1165 Type 2 diabetes mellitus with hyperglycemia: Secondary | ICD-10-CM | POA: Diagnosis not present

## 2020-09-10 DIAGNOSIS — Z886 Allergy status to analgesic agent status: Secondary | ICD-10-CM

## 2020-09-10 DIAGNOSIS — F0150 Vascular dementia without behavioral disturbance: Secondary | ICD-10-CM | POA: Diagnosis not present

## 2020-09-10 DIAGNOSIS — Z8249 Family history of ischemic heart disease and other diseases of the circulatory system: Secondary | ICD-10-CM

## 2020-09-10 DIAGNOSIS — E875 Hyperkalemia: Secondary | ICD-10-CM | POA: Diagnosis not present

## 2020-09-10 DIAGNOSIS — N179 Acute kidney failure, unspecified: Secondary | ICD-10-CM | POA: Diagnosis not present

## 2020-09-10 DIAGNOSIS — G459 Transient cerebral ischemic attack, unspecified: Secondary | ICD-10-CM | POA: Insufficient documentation

## 2020-09-10 DIAGNOSIS — E1122 Type 2 diabetes mellitus with diabetic chronic kidney disease: Secondary | ICD-10-CM | POA: Diagnosis not present

## 2020-09-10 DIAGNOSIS — Z20822 Contact with and (suspected) exposure to covid-19: Secondary | ICD-10-CM | POA: Diagnosis present

## 2020-09-10 DIAGNOSIS — Z7984 Long term (current) use of oral hypoglycemic drugs: Secondary | ICD-10-CM

## 2020-09-10 DIAGNOSIS — R29818 Other symptoms and signs involving the nervous system: Secondary | ICD-10-CM | POA: Diagnosis not present

## 2020-09-10 LAB — URINALYSIS, ROUTINE W REFLEX MICROSCOPIC
Bilirubin Urine: NEGATIVE
Glucose, UA: NEGATIVE mg/dL
Hgb urine dipstick: NEGATIVE
Ketones, ur: NEGATIVE mg/dL
Nitrite: NEGATIVE
Protein, ur: >=300 mg/dL — AB
Specific Gravity, Urine: 1.011 (ref 1.005–1.030)
pH: 7 (ref 5.0–8.0)

## 2020-09-10 LAB — CBC WITH DIFFERENTIAL/PLATELET
Abs Immature Granulocytes: 0.01 10*3/uL (ref 0.00–0.07)
Basophils Absolute: 0 10*3/uL (ref 0.0–0.1)
Basophils Relative: 1 %
Eosinophils Absolute: 0.1 10*3/uL (ref 0.0–0.5)
Eosinophils Relative: 1 %
HCT: 24.9 % — ABNORMAL LOW (ref 36.0–46.0)
Hemoglobin: 8.3 g/dL — ABNORMAL LOW (ref 12.0–15.0)
Immature Granulocytes: 0 %
Lymphocytes Relative: 25 %
Lymphs Abs: 1.1 10*3/uL (ref 0.7–4.0)
MCH: 30 pg (ref 26.0–34.0)
MCHC: 33.3 g/dL (ref 30.0–36.0)
MCV: 89.9 fL (ref 80.0–100.0)
Monocytes Absolute: 0.5 10*3/uL (ref 0.1–1.0)
Monocytes Relative: 10 %
Neutro Abs: 2.9 10*3/uL (ref 1.7–7.7)
Neutrophils Relative %: 63 %
Platelets: 147 10*3/uL — ABNORMAL LOW (ref 150–400)
RBC: 2.77 MIL/uL — ABNORMAL LOW (ref 3.87–5.11)
RDW: 12.7 % (ref 11.5–15.5)
WBC: 4.6 10*3/uL (ref 4.0–10.5)
nRBC: 0 % (ref 0.0–0.2)

## 2020-09-10 LAB — BASIC METABOLIC PANEL
Anion gap: 7 (ref 5–15)
BUN: 44 mg/dL — ABNORMAL HIGH (ref 8–23)
CO2: 22 mmol/L (ref 22–32)
Calcium: 8.6 mg/dL — ABNORMAL LOW (ref 8.9–10.3)
Chloride: 108 mmol/L (ref 98–111)
Creatinine, Ser: 5.17 mg/dL — ABNORMAL HIGH (ref 0.44–1.00)
GFR, Estimated: 8 mL/min — ABNORMAL LOW (ref >60)
Glucose, Bld: 146 mg/dL — ABNORMAL HIGH (ref 70–99)
Potassium: 5.2 mmol/L — ABNORMAL HIGH (ref 3.5–5.1)
Sodium: 137 mmol/L (ref 135–145)

## 2020-09-10 LAB — SARS CORONAVIRUS 2 (TAT 6-24 HRS): SARS Coronavirus 2: NEGATIVE

## 2020-09-10 MED ORDER — HALOPERIDOL LACTATE 5 MG/ML IJ SOLN
2.0000 mg | Freq: Once | INTRAMUSCULAR | Status: AC
  Administered 2020-09-10: 2 mg via INTRAVENOUS
  Filled 2020-09-10: qty 1

## 2020-09-10 NOTE — Consult Note (Signed)
Referring Physician: Dr. Alvino Chapel    Chief Complaint: New onset of left facial droop, slurred speech and LUE weakness  HPI: Brianna Dodson is an 87 y.o. 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 LKN was at 9 AM when she appeared to family to be her normal self. At 11 AM when family checked on her, the new left sided facial droop was noted. She initially refused to go to the ED stating that she was fine. After several hours passed, she was finally convinced by family to go to the ED. Per family, she had had left sided deficits from her prior stroke but these had resolved, with no facial droop or other symptoms for quite some time.   CT head: 1. Substantially increased encephalomalacia of posterior right MCA territory, generally in keeping with expected evolution of acute diffusion restricting infarction identified on priors dated 08/20/2019. 2. The most posterior region of hypodensity, in the right temporal occipital junction does not appear to have been involved on prior MR and is suspicious for an acute to subacute infarction. This may be further evaluated by MR. 3. No evidence of hemorrhage. 4. Underlying small-vessel white matter disease and mild global volume loss.  EKG: Sinus or ectopic atrial rhythm Left axis deviation  Labs in the ED include elevated BUN and Cr at 44 and 5.17, Hgb 8.3 and normal WBC.   MRI was ordered but patient unable to tolerate, becoming highly agitated.   LSN: [id number] Given: No: Out of the time window and on Eliquis  Past Medical History:  Diagnosis Date  . CKD (chronic kidney disease), stage III ([place])   . Diabetes mellitus without complication ([place])   . History of blood transfusion   . Hyperlipidemia   .  Hypertension   . Stroke [facility])     Past Surgical History:  Procedure Laterality Date  . ABDOMINAL HYSTERECTOMY  1985  . LOOP RECORDER INSERTION N/A 07/30/2019   Procedure: LOOP RECORDER INSERTION;  Surgeon: Isaias Cowman, MD;  Location: [facility];  Service: Cardiovascular;  Laterality: N/A;  . TEE WITHOUT CARDIOVERSION N/A 04/14/2019   Procedure: TRANSESOPHAGEAL ECHOCARDIOGRAM (TEE);  Surgeon: Teodoro Spray, MD;  Location: [facility] OR[facility];  Service: Cardiovascular;  Laterality: N/A;    Family History  Problem Relation Age of Onset  . Stroke Mother   . Diabetes Mother   . Heart disease Father   . Kidney disease Sister   . Diabetes Sister   . Kidney disease Brother   . Heart disease Sister   . Diabetes Sister   . Diabetes Sister   . Diabetes Sister   . Kidney disease Sister   . Heart disease Sister   . Kidney disease Brother        kidney transplant  . Early death Brother 79       Truck Accident - died  . Heart disease Brother    Social History:  reports that she has never smoked. She has never used smokeless tobacco. She reports that she does not drink alcohol and does not use drugs.  Allergies:  Allergies  Allergen Reactions  . Nsaids     CKD stage III - Avoid all nephrotoxic drugs    Medications:  No current facility-administered medications on file  prior to encounter.   Current Outpatient Medications on File Prior to Encounter  Medication Sig Dispense Refill  . amLODipine (NORVASC) 10 MG tablet Take 1 tablet (10 mg total) by mouth daily. 90 tablet 0  . carvedilol (COREG) 6.25 MG tablet Take 1 tablet (6.25 mg total) by mouth 2 (two) times daily with a meal. 180 tablet 1  . ELIQUIS 2.5 MG TABS tablet SMARTSIG:1 Tablet(s) By Mouth Every 12 Hours 60 tablet 1  . ezetimibe (ZETIA) 10 MG tablet Take 1 tablet (10 mg total) by mouth daily. 90 tablet 3  . Ferrous Sulfate (IRON) 28 MG TABS Take 28 mg by mouth daily at 12 noon.    Marland Kitchen glimepiride (AMARYL) 2 MG  tablet Take 1 tablet (2 mg total) by mouth daily. 90 tablet 0  . glucose blood (ACCU-CHEK GUIDE) test strip USE TO CHECK BLOOD SUGARS TWICE DAILY. E11.9 100 each 0  . hydrALAZINE (APRESOLINE) 50 MG tablet Take 50 mg by mouth 2 (two) times daily.    Marland Kitchen losartan (COZAAR) 50 MG tablet Take 1 tablet (50 mg total) by mouth daily. 90 tablet 3  . memantine (NAMENDA) 5 MG tablet Take 5 mg by mouth 2 (two) times daily.    . Multiple Vitamin (MULTIVITAMIN ADULT PO) Take 1 tablet by mouth daily.    . rosuvastatin (CRESTOR) 40 MG tablet Take 1 tablet (40 mg total) by mouth daily. 90 tablet 1  . Vitamin D, Ergocalciferol, (DRISDOL) 50000 units CAPS capsule Take 50,000 Units by mouth every 30 (thirty) days.  1  . Lancets (ONETOUCH ULTRASOFT) lancets Use as instructed 100 each 6    ROS: No cough, CP, fevers, diarrhea, vomiting, abdominal pain, headache or rash. Other ROS as per HPI.     Physical Examination: Blood pressure (!) 158/54, pulse 65, resp. rate (!) 24, SpO2 95 %.  HEENT: [place]/AT Lungs: Respirations unlabored Ext: No edema  Neurologic Examination: Mental Status: Awake and alert. Speech with moderate dysarthria, but fluent with intact comprehension. Repetition intact. Able to follow all motor commands. Oriented x 5. Cranial Nerves: II:  Temporal visual fields intact with no extinction to DSS. Fixates on and tracks visual stimuli.  III,IV, VI: No ptosis. EOMI.   V,VII: Facial sensation grossly intact bilaterally. Left facial droop.  VIII: Hearing intact to voice IX,X: No hypophonia XI: Head is midline XII: Tongue deviates slightly to the left when protruded Motor: RUE 5/5 RLE 5/5 LUE 4/5 LLE 3/5 hip flexion, 4/5 more distally Sensory: Grossly intact to FT bilaterally. No extinction to DSS.  Deep Tendon Reflexes:  Brisk reflexes x 4.  Plantars: Right: Downgoing  Left: Upgoing Cerebellar: Mild ataxia with right FNF. Moderate ataxia with left FNF.  Gait: Deferred due to falls risk  concerns   Results for orders placed or performed during the hospital encounter of 09/10/20 (from the past 48 hour(s))  CBC with Differential     Status: Abnormal   Collection Time: 09/10/20  2:57 PM  Result Value Ref Range   WBC 4.6 4.0 - 10.5 K/uL   RBC 2.77 (L) 3.87 - 5.11 MIL/uL   Hemoglobin 8.3 (L) 12.0 - 15.0 g/dL   HCT 24.9 (L) 36.0 - 46.0 %   MCV 89.9 80.0 - 100.0 fL   MCH 30.0 26.0 - 34.0 pg   MCHC 33.3 30.0 - 36.0 g/dL   RDW 12.7 11.5 - 15.5 %   Platelets 147 (L) 150 - 400 K/uL   nRBC 0.0 0.0 - 0.2 %  Neutrophils Relative % 63 %   Neutro Abs 2.9 1.7 - 7.7 K/uL   Lymphocytes Relative 25 %   Lymphs Abs 1.1 0.7 - 4.0 K/uL   Monocytes Relative 10 %   Monocytes Absolute 0.5 0.1 - 1.0 K/uL   Eosinophils Relative 1 %   Eosinophils Absolute 0.1 0.0 - 0.5 K/uL   Basophils Relative 1 %   Basophils Absolute 0.0 0.0 - 0.1 K/uL   Immature Granulocytes 0 %   Abs Immature Granulocytes 0.01 0.00 - 0.07 K/uL    Comment: Performed at [facility] [address], [place], [place] [address]  Basic metabolic panel     Status: Abnormal   Collection Time: 09/10/20  2:57 PM  Result Value Ref Range   Sodium 137 135 - 145 mmol/L   Potassium 5.2 (H) 3.5 - 5.1 mmol/L   Chloride 108 98 - 111 mmol/L   CO2 22 22 - 32 mmol/L   Glucose, Bld 146 (H) 70 - 99 mg/dL    Comment: Glucose reference range applies only to samples taken after fasting for at least 8 hours.   BUN 44 (H) 8 - 23 mg/dL   Creatinine, Ser 5.17 (H) 0.44 - 1.00 mg/dL   Calcium 8.6 (L) 8.9 - 10.3 mg/dL   GFR, Estimated 8 (L) >60 mL/min    Comment: (NOTE) Calculated using the CKD-EPI Creatinine Equation (2021)    Anion gap 7 5 - 15    Comment: Performed at [facility] [address], [place], [place] [address]   CT Head Wo Contrast  Result Date: 09/10/2020 CLINICAL DATA:  Neuro deficit, stroke suspected, new left facial droop EXAM: CT HEAD WITHOUT CONTRAST TECHNIQUE: Contiguous axial images were obtained from  the base of the skull through the vertex without intravenous contrast. COMPARISON:  CT brain, 08/20/2019, MR brain, 08/20/2019 FINDINGS: Brain: Substantially increased encephalomalacia of posterior right MCA territory, generally in keeping with expected evolution of acute diffusion restricting infarction identified on priors dated 08/20/2019. The most posterior region of hypodensity, in the right temporal occipital junction does not appear to have been involved on prior MR and is suspicious for an acute to subacute infarction (series 3, image 19). No evidence of hemorrhage. Underlying small-vessel white matter disease and mild global volume loss. Vascular: No hyperdense vessel or unexpected calcification. Skull: Normal. Negative for fracture or focal lesion. Sinuses/Orbits: No acute finding. Other: None. IMPRESSION: 1. Substantially increased encephalomalacia of posterior right MCA territory, generally in keeping with expected evolution of acute diffusion restricting infarction identified on priors dated 08/20/2019. 2. The most posterior region of hypodensity, in the right temporal occipital junction does not appear to have been involved on prior MR and is suspicious for an acute to subacute infarction. This may be further evaluated by MR. 3. No evidence of hemorrhage. 4. Underlying small-vessel white matter disease and mild global volume loss. Electronically Signed   By: Eddie Candle M.D.   On: 09/10/2020 18:37    Assessment: 87 y.o. female with atrial fibrillation, prior right MCA stroke and mixed dementia presenting with new onset of left facial droop, slurred speech and LUE weakness 1. Exam reveals dysarthria, left facial droop, left sided weakness, left upgoing toe and appendicular ataxia.  2. CT head: Substantially increased encephalomalacia of posterior right MCA territory, generally in keeping with expected evolution of acute diffusion restricting infarction identified on priors dated 08/20/2019. The  most posterior region of hypodensity, in the right temporal occipital junction does not appear  to have been involved on prior MR and is suspicious for a subacute infarction. No evidence of hemorrhage. Underlying small-vessel white matter disease and mild global volume loss.  3. Stroke Risk Factors - atrial fibrillation, prior right MCA stroke, DM, HLD and HTN   Recommendations: 1. HgbA1c, fasting lipid panel 2. Second attempt at MRI of the brain without contrast if the patient is able to tolerate.  3. PT consult, OT consult, Speech consult 4. Echocardiogram 5. CTA of head and neck 6. Continue home Eliquis. Her subacute right MCA stroke is small enough such that the benefit of continuing this medication for recurrent ischemic stroke prevention outweighs the risk of hemorrhagic conversion while on this medication.  7. Risk factor modification 8. Telemetry monitoring 9. Frequent neuro checks 10. Modified permissive HTN protocol given advanced age: Treat SBP if > 180. After 24 hours following onset of her stroke symptoms (which will be at about noon on Saturday), start reducing SBP by 15% per day to a goal of 120-140.    @Electronically  signed: Dr. Kerney Elbe  09/10/2020, 7:36 PM

## 2020-09-10 NOTE — ED Notes (Signed)
Pt to MRI

## 2020-09-10 NOTE — ED Notes (Signed)
Pt very upset, yelling at sister at bedside. Pt's sister and Dr. Alvino Chapel conversing at bedside.

## 2020-09-10 NOTE — ED Notes (Signed)
Pt wanting to leave, sitting on end of bed.  Pt asking to speak w/MD, MD notified. Pt asked to scoot back in bed, pt continually moving toward end of bed.  Explained risks to pt, pt insistent that she is fine and wants to go home.

## 2020-09-10 NOTE — Progress Notes (Signed)
Unable to obtain diagnostic images, pt is unable to calm down and hold still. Pt refused exam and began yelling and tried coming off MRI table after obtaining localizer scan.

## 2020-09-10 NOTE — H&P (Signed)
History and Physical    Brianna Dodson MHD:[id number] DOB: [birth year] DOA: 09/10/2020  PCP: [name], MD   Patient coming from: Home  Chief Complaint: facial droop, weakness, confusion  HPI: Brianna Dodson is a 87 y.o. female with medical history significant for HTN, HLD, DMT2, previous CVA, dementia, CKD 4 who presents by EMS for possible stroke with facial droop, left sided weakness and worsening mental status. She lives at home with a granddaughter. No family present at this time. Called Brianna Dodson's sister, Brianna Dodson, who provided history.  Brianna Dodson was last seen around 9 AM this morning in her normal state of health when she went to get breakfast with her Sister Brianna Dodson.  As they were leaving the restaurant Brianna Dodson suddenly dropped her purse and bag of food and seemed very confused.  When she got home and her sister noted that she had some drooping of the left side of her face which was new.  Initially Brianna Dodson refused to go to the hospital for evaluation.  A few hours later when other family members came to check on her she seemed more confused and agitated than her normal state and appeared weaker than normal and they convinced her to finally come to the hospital.  Brianna Dodson states that she is concerned about her being able to be taken care of at home.  Her sister reports that she has not been eating or drinking much over the last few days.  There is no report of any fever, syncope, nausea, vomiting, diarrhea.  Sister reports that she went to see her nephrologist a few weeks ago and they were just watching her kidney function levels and she has never been put on dialysis.  Reportedly the ER physician discussed with a granddaughter who lives with the patient and the granddaughter felt that patient could live at home as she cares for her there.  Brianna Dodson is unsure who the power of attorney is for Brianna Dodson but will find it out.  ED Course: She is found to have worsening renal  function with creatinine increased baseline at 3.2-5.40.  CT the head is concerning for possible acute versus subacute stroke in the right temporal occipital region.  Is noted the patient was very agitated in the emergency room and ER physician gave her Haldol to help calm her down so she get the CT scan.  Neurologist been consulted and MRI has been ordered but has not been obtained yet.  Review of Systems:  Review of system unable to be obtained secondary to dementia  Past Medical History:  Diagnosis Date  . CKD (chronic kidney disease), stage III ([place])   . Diabetes mellitus without complication ([place])   . History of blood transfusion   . Hyperlipidemia   . Hypertension   . Stroke [facility])     Past Surgical History:  Procedure Laterality Date  . ABDOMINAL HYSTERECTOMY  1985  . LOOP RECORDER INSERTION N/A 07/30/2019   Procedure: LOOP RECORDER INSERTION;  Surgeon: Isaias Cowman, MD;  Location: [place] CV LAB;  Service: Cardiovascular;  Laterality: N/A;  . TEE WITHOUT CARDIOVERSION N/A 04/14/2019   Procedure: TRANSESOPHAGEAL ECHOCARDIOGRAM (TEE);  Surgeon: Teodoro Spray, MD;  Location: [facility] ORS;  Service: Cardiovascular;  Laterality: N/A;    Social History  reports that she has never smoked. She has never used smokeless tobacco. She reports that she does not drink alcohol and does not use drugs.  Allergies  Allergen Reactions  . Nsaids  CKD stage III - Avoid all nephrotoxic drugs    Family History  Problem Relation Age of Onset  . Stroke Mother   . Diabetes Mother   . Heart disease Father   . Kidney disease Sister   . Diabetes Sister   . Kidney disease Brother   . Heart disease Sister   . Diabetes Sister   . Diabetes Sister   . Diabetes Sister   . Kidney disease Sister   . Heart disease Sister   . Kidney disease Brother        kidney transplant  . Early death Brother 69       Truck Accident - died  . Heart disease Brother      Prior to Admission  medications   Medication Sig Start Date End Date Taking? Authorizing Provider  amLODipine (NORVASC) 10 MG tablet Take 1 tablet (10 mg total) by mouth daily. 09/08/20  Yes [name], MD  carvedilol (COREG) 6.25 MG tablet Take 1 tablet (6.25 mg total) by mouth 2 (two) times daily with a meal. 04/22/20  Yes [name], MD  ELIQUIS 2.5 MG TABS tablet SMARTSIG:1 Tablet(s) By Mouth Every 12 Hours 04/22/20  Yes [name], MD  ezetimibe (ZETIA) 10 MG tablet Take 1 tablet (10 mg total) by mouth daily. 04/22/20  Yes [name], MD  Ferrous Sulfate (IRON) 28 MG TABS Take 28 mg by mouth daily at 12 noon.   Yes [provider]  glimepiride (AMARYL) 2 MG tablet Take 1 tablet (2 mg total) by mouth daily. 09/08/20  Yes [name], MD  glucose blood (ACCU-CHEK GUIDE) test strip USE TO CHECK BLOOD SUGARS TWICE DAILY. E11.9 04/22/20  Yes [name], MD  hydrALAZINE (APRESOLINE) 50 MG tablet Take 50 mg by mouth 2 (two) times daily.   Yes [provider]  losartan (COZAAR) 50 MG tablet Take 1 tablet (50 mg total) by mouth daily. 04/22/20  Yes [name], MD  memantine (NAMENDA) 5 MG tablet Take 5 mg by mouth 2 (two) times daily.   Yes [provider]  Multiple Vitamin (MULTIVITAMIN ADULT PO) Take 1 tablet by mouth daily.   Yes [provider]  rosuvastatin (CRESTOR) 40 MG tablet Take 1 tablet (40 mg total) by mouth daily. 07/29/20  Yes [name], MD  Vitamin D, Ergocalciferol, (DRISDOL) 50000 units CAPS capsule Take 50,000 Units by mouth every 30 (thirty) days. 10/05/17  Yes [provider]  Lancets Glory Rosebush ULTRASOFT) lancets Use as instructed 04/22/20   [name], MD    Physical Exam: Vitals:   09/10/20 1845 09/10/20 1900 09/10/20 1915 09/10/20 1930  BP: (!) 145/67 (!) 152/59 (!) 158/54 (!) 159/63  Pulse: 72  65 61  Resp: 18 17 (!) 24 (!) 21  SpO2: 99%  95% 100%    Constitutional: NAD, calm,  comfortable Vitals:   09/10/20 1845 09/10/20 1900 09/10/20 1915 09/10/20 1930  BP: (!) 145/67 (!) 152/59 (!) 158/54 (!) 159/63  Pulse: 72  65 61  Resp: 18 17 (!) 24 (!) 21  SpO2: 99%  95% 100%   General: WDWN, Alert and oriented to self.  Eyes: EOMI, PERRL,  conjunctivae normal.  Sclera nonicteric HENT:  [place]/AT, external ears normal.  Nares patent without epistasis.  Mucous membranes are moist.  Neck: Soft, normal range of motion, supple, no masses, no thyromegaly. Trachea midline Respiratory: clear to auscultation bilaterally, no wheezing, no crackles. Normal respiratory effort. No accessory  muscle use.  Cardiovascular: Regular rate and rhythm, no murmurs / rubs / gallops. No extremity edema. 1+ pedal pulses. No carotid bruits.  Abdomen: Soft, no tenderness, nondistended, no rebound or guarding.  No masses palpated. Bowel sounds normoactive Musculoskeletal:  FROM. no cyanosis. No joint deformity upper and lower extremities. Normal muscle tone.  Skin: Warm, dry, intact no rashes, lesions, ulcers. No induration Neurologic: CN 2-12 grossly intact.  Normal speech. Sensation intact, patella DTR +1 bilaterally. Strength 4/5 in all extremities.  No tremor noted. Psychiatric: normal mood and affect.    Labs on Admission: I have personally reviewed following labs and imaging studies  CBC: Recent Labs  Lab 09/10/20 1457  WBC 4.6  NEUTROABS 2.9  HGB 8.3*  HCT 24.9*  MCV 89.9  PLT 147*    Basic Metabolic Panel: Recent Labs  Lab 09/10/20 1457  NA 137  K 5.2*  CL 108  CO2 22  GLUCOSE 146*  BUN 44*  CREATININE 5.17*  CALCIUM 8.6*    GFR: Estimated Creatinine Clearance: 8.4 mL/min (A) (by C-G formula based on SCr of 5.17 mg/dL (H)).  Liver Function Tests: No results for input(s): AST, ALT, ALKPHOS, BILITOT, PROT, ALBUMIN in the last 168 hours.  Urine analysis:    Component Value Date/Time   COLORURINE YELLOW (A) 03/15/2019 1256   APPEARANCEUR CLOUDY (A) 03/15/2019 1256    APPEARANCEUR Clear 11/09/2013 1902   LABSPEC 1.014 03/15/2019 1256   LABSPEC 1.011 11/09/2013 1902   PHURINE 6.0 03/15/2019 1256   GLUCOSEU 50 (A) 03/15/2019 1256   GLUCOSEU >=1000 (A) 01/06/2016 0923   HGBUR NEGATIVE 03/15/2019 1256   BILIRUBINUR neg 06/09/2020 1247   BILIRUBINUR Negative 11/09/2013 1902   KETONESUR NEGATIVE 03/15/2019 1256   PROTEINUR Positive (A) 06/09/2020 1247   PROTEINUR 100 (A) 03/15/2019 1256   UROBILINOGEN 0.2 06/09/2020 1247   UROBILINOGEN 0.2 01/06/2016 0923   NITRITE neg 06/09/2020 1247   NITRITE NEGATIVE 03/15/2019 1256   LEUKOCYTESUR Negative 06/09/2020 1247   LEUKOCYTESUR TRACE (A) 03/15/2019 1256   LEUKOCYTESUR Trace 11/09/2013 1902    Radiological Exams on Admission: CT Head Wo Contrast  Result Date: 09/10/2020 CLINICAL DATA:  Neuro deficit, stroke suspected, new left facial droop EXAM: CT HEAD WITHOUT CONTRAST TECHNIQUE: Contiguous axial images were obtained from the base of the skull through the vertex without intravenous contrast. COMPARISON:  CT brain, 08/20/2019, MR brain, 08/20/2019 FINDINGS: Brain: Substantially increased encephalomalacia of posterior right MCA territory, generally in keeping with expected evolution of acute diffusion restricting infarction identified on priors dated 08/20/2019. The most posterior region of hypodensity, in the right temporal occipital junction does not appear to have been involved on prior MR and is suspicious for an acute to subacute infarction (series 3, image 19). No evidence of hemorrhage. Underlying small-vessel white matter disease and mild global volume loss. Vascular: No hyperdense vessel or unexpected calcification. Skull: Normal. Negative for fracture or focal lesion. Sinuses/Orbits: No acute finding. Other: None. IMPRESSION: 1. Substantially increased encephalomalacia of posterior right MCA territory, generally in keeping with expected evolution of acute diffusion restricting infarction identified on priors  dated 08/20/2019. 2. The most posterior region of hypodensity, in the right temporal occipital junction does not appear to have been involved on prior MR and is suspicious for an acute to subacute infarction. This may be further evaluated by MR. 3. No evidence of hemorrhage. 4. Underlying small-vessel white matter disease and mild global volume loss. Electronically Signed   By: Eddie Candle M.D.   On:  09/10/2020 18:37    EKG: Independently reviewed.  EKG shows sinus rhythm with no acute ST elevation or depression.  QTc 433  Assessment/Plan Principal Problem:   CVA Ms. Autry is admitted on medical telemetry floor for TIA/CVA.  Will obtain MRI of brain to rule out acute ischemic CVA.  Will evaluate carotids for large vessel occlusion/stenosis. Obtain echocardiogram to evaluate for PFO, wall motion and ejection fraction. Hypertension of 220/110 will be allowed for 24 hours per stroke protocol.  After which blood pressure will be slowly reduced to goal level. Pt is anticoagulated on eliquis Continue statin therapy but will change to lipitor from crestor due to renal function.  Check lipid panel.  Neurochecks per stroke protocol  Active Problems:   Acute kidney injury superimposed on CKD  Pt with CKD stage 4 with acute decrease in renal function on labs today.  IVF hydration overnight Recheck renal function and electrolytes in am.  Reportedly pt had decreased po intake over past few days which could be reason for elevated creatinine/BUN today due to dehydration    Essential hypertension Pt is on norvasc, coreg, hydralazine and cozaar at home. Hold cozaar with deteriorated renal function. Monitor blood pressure    DM type 2 (diabetes mellitus, type 2)  Hold Amaryl while in the hospital.  Blood sugar will be monitored with meals and at bedtime and SSI provided as needed for glycemic control. Check Hemoglobin A1c    Mixed Alzheimer's and vascular dementia  Continue home dose of Namenda.     Anemia of chronic disease Stable    DVT prophylaxis: Pt is on eliquis for anticoagulation chronically which is continued Code Status:   Full code  Family Communication:  Discussed diagnosis and plan with patient's sister, Brianna Dodson, over the phone.  He is unsure who in the family is the power of attorney but will find that out and let medical team know tomorrow.  Disposition Plan:   Patient is from:  Home  Anticipated DC to:  Home versus SNF which will be determined during hospitalization  Anticipated DC date:  Anticipate 2 midnight stay in the hospital to treat acute condition  Anticipated DC barriers: Barriers to discharge could be issue of patient placement.  Different family      numbers have conflicting feelings on if patient can live at home will need      placement and is unclear who the power of attorney is at this point.       Discharge planning social will need to be involved  Consults called:  Neurology  Admission status:  Inpatient  Yevonne Aline Eula Jaster MD [facility]  How to contact the [facility] Attending or Consulting provider [place] or covering provider during after hours [place], for this patient?   1. Check the care team in [facility] and look for a) attending/consulting [facility] provider listed and b) the [facility] team listed 2. Log into [email or web address] and use [place]'s universal password to access. If you do not have the password, please contact the hospital operator. 3. Locate the [facility] provider you are looking for under [facility] and page to a number that you can be directly reached. 4. If you still have difficulty reaching the provider, please page the [facility] (Director on Call) for the Hospitalists listed on amion for assistance.  09/10/2020, 7:50 PM

## 2020-09-10 NOTE — ED Notes (Signed)
Pt refused cath, states she does not want to stay tonight. MD to bedside conversing with pt.

## 2020-09-10 NOTE — ED Notes (Signed)
Pt assisted to bedside commode

## 2020-09-10 NOTE — ED Notes (Signed)
Pt returned from MRI. Pt refusing MRI and CT scan. Pt states she wants to go home. MD notified.

## 2020-09-10 NOTE — ED Provider Notes (Signed)
[place] EMERGENCY DEPARTMENT Provider Note   CSN: [id number] Arrival date & time: 09/10/20  1420     History Chief Complaint  Patient presents with  . Code Stroke    Last known well 0900    Brianna Dodson is a 87 y.o. female.  Patient brought into the ER by ambulance chief complaint of possible stroke.  Family states that she was last seen well around 9 AM this morning when she appeared her normal self.  However the kidney check on her around 11 AM, they noted left-sided facial droop.  Patient refused to go to the ER at the time states that she was fine.  Several hours past and more family numbers came in finally convinced the patient to go to the ER.  They state that she had a stroke in the past with left-sided weakness but she has not had any facial droop or other symptoms for quite some time now.  Otherwise no reports of fevers or cough no vomiting or diarrhea.        Past Medical History:  Diagnosis Date  . CKD (chronic kidney disease), stage III ([place])   . Diabetes mellitus without complication ([place])   . History of blood transfusion   . Hyperlipidemia   . Hypertension   . Stroke [facility])     Patient Active Problem List   Diagnosis Date Noted  . Sleeping difficulty 07/08/2020  . Fatigue 06/09/2020  . Mixed Alzheimer's and vascular dementia ([place]) 06/09/2020  . Coccydynia 01/26/2020  . Leukopenia 12/08/2019  . Muscle strain 12/08/2019  . Pedal edema 11/26/2019  . Dysarthria and anarthria 11/07/2019  . Atrial fibrillation ([place]) 10/21/2019  . Nocturia 10/21/2019  . Seborrheic keratosis 10/21/2019  . Carotid stenosis 08/27/2019  . Thyroid nodule 08/27/2019  . Status post placement of implantable loop recorder 08/07/2019  . Anemia 04/18/2019  . Aphasia 04/12/2019  . Benign hypertensive renal disease 04/07/2019  . Hyperparathyroidism due to renal insufficiency ([place]) 04/07/2019  . 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 Proteinuria 04/07/2019  . History of stroke with current residual effects 03/15/2019  . Positional numbness and tingling in both hands, ulnar aspect 09/19/2018  . Hyperlipidemia associated with type 2 diabetes mellitus ([place]) 07/25/2018  . Contusion of left shoulder 04/11/2018  . Itching 10/23/2017  . Nevus 04/17/2017  . CKD stage 4 due to type 2 diabetes mellitus ([place]) 02/02/2015  . Vitamin D deficiency 02/02/2015  . Obesity (BMI 30-39.9) 08/03/2014  . DM type 2 (diabetes mellitus, type 2) ([place]) 01/29/2014  . Essential hypertension 01/13/2014  . HLD (hyperlipidemia) 01/13/2014    Past Surgical History:  Procedure Laterality Date  . ABDOMINAL HYSTERECTOMY  1985  . LOOP RECORDER INSERTION N/A 07/30/2019   Procedure: LOOP RECORDER INSERTION;  Surgeon: Isaias Cowman, MD;  Location: [place] CV LAB;  Service: Cardiovascular;  Laterality: N/A;  . TEE WITHOUT CARDIOVERSION N/A 04/14/2019   Procedure: TRANSESOPHAGEAL ECHOCARDIOGRAM (TEE);  Surgeon: Teodoro Spray, MD;  Location: [facility] ORS;  Service: Cardiovascular;  Laterality: N/A;     OB History   No obstetric history on file.     Family History  Problem Relation Age of Onset  . Stroke Mother   . Diabetes Mother   . Heart disease Father   . Kidney disease Sister   . Diabetes Sister   . Kidney disease Brother   . Heart disease Sister   . Diabetes Sister   .  Diabetes Sister   . Diabetes Sister   . Kidney disease Sister   . Heart disease Sister   . Kidney disease Brother        kidney transplant  . Early death Brother 28       Truck Accident - died  . Heart disease Brother     Social History   Tobacco Use  . Smoking status: Never Smoker  . Smokeless tobacco: Never Used  Substance Use Topics  . Alcohol use: No  . Drug use: No    Home Medications Prior to Admission medications   Medication Sig Start Date End Date Taking? Authorizing Provider   amLODipine (NORVASC) 10 MG tablet Take 1 tablet (10 mg total) by mouth daily. 09/08/20  Yes [name], MD  carvedilol (COREG) 6.25 MG tablet Take 1 tablet (6.25 mg total) by mouth 2 (two) times daily with a meal. 04/22/20  Yes [name], MD  ELIQUIS 2.5 MG TABS tablet SMARTSIG:1 Tablet(s) By Mouth Every 12 Hours 04/22/20  Yes [name], MD  ezetimibe (ZETIA) 10 MG tablet Take 1 tablet (10 mg total) by mouth daily. 04/22/20  Yes [name], MD  Ferrous Sulfate (IRON) 28 MG TABS Take 28 mg by mouth daily at 12 noon.   Yes [provider]  glimepiride (AMARYL) 2 MG tablet Take 1 tablet (2 mg total) by mouth daily. 09/08/20  Yes [name], MD  glucose blood (ACCU-CHEK GUIDE) test strip USE TO CHECK BLOOD SUGARS TWICE DAILY. E11.9 04/22/20  Yes [name], MD  hydrALAZINE (APRESOLINE) 50 MG tablet Take 50 mg by mouth 2 (two) times daily.   Yes [provider]  losartan (COZAAR) 50 MG tablet Take 1 tablet (50 mg total) by mouth daily. 04/22/20  Yes [name], MD  memantine (NAMENDA) 5 MG tablet Take 5 mg by mouth 2 (two) times daily.   Yes [provider]  Multiple Vitamin (MULTIVITAMIN ADULT PO) Take 1 tablet by mouth daily.   Yes [provider]  rosuvastatin (CRESTOR) 40 MG tablet Take 1 tablet (40 mg total) by mouth daily. 07/29/20  Yes [name], MD  Vitamin D, Ergocalciferol, (DRISDOL) 50000 units CAPS capsule Take 50,000 Units by mouth every 30 (thirty) days. 10/05/17  Yes [provider]  Lancets Glory Rosebush ULTRASOFT) lancets Use as instructed 04/22/20   [name], MD    Allergies    Nsaids  Review of Systems   Review of Systems  Constitutional: Negative for fever.  HENT: Negative for ear pain.   Eyes: Negative for pain.  Respiratory: Negative for cough.   Cardiovascular: Negative for chest pain.  Gastrointestinal: Negative for abdominal pain.  Genitourinary: Negative for  flank pain.  Musculoskeletal: Negative for back pain.  Skin: Negative for rash.  Neurological: Negative for headaches.    Physical Exam Updated Vital Signs SpO2 100%   Physical Exam Constitutional:      General: She is not in acute distress.    Appearance: Normal appearance.  HENT:     Head: Normocephalic.     Nose: Nose normal.  Eyes:     Extraocular Movements: Extraocular movements intact.  Cardiovascular:     Rate and Rhythm: Normal rate.  Pulmonary:     Effort: Pulmonary effort is normal.  Musculoskeletal:        General: Normal range of motion.     Cervical back: Normal range of motion.  Neurological:     Mental  Status: She is alert.     Comments: Patient awake and alert.  Moving all extremities.  Questionable subjective weakness of the left upper and left lower extremities.  Patient has left-sided facial droop and mild slurred speech, speech pattern is otherwise comprehensible.     ED Results / Procedures / Treatments   Labs (all labs ordered are listed, but only abnormal results are displayed) Labs Reviewed  SARS CORONAVIRUS 2 (TAT 6-24 HRS)  CBC WITH DIFFERENTIAL/PLATELET  BASIC METABOLIC PANEL    EKG None  Radiology No results found.  Procedures Procedures   Medications Ordered in ED Medications - No data to display  ED Course  I have reviewed the triage vital signs and the nursing notes.  Pertinent labs & imaging results that were available during my care of the patient were reviewed by me and considered in my medical decision making (see chart for details).    MDM Rules/Calculators/A&P                          Patient not a candidate for TPA given 5 hours from last known well.   D/w neurology, advise pursue MRI's.  Final Clinical Impression(s) / ED Diagnoses Final diagnoses:  Stroke-like episode    Rx / DC Orders ED Discharge Orders    None       Luna Fuse, MD 09/10/20 1501

## 2020-09-10 NOTE — ED Notes (Signed)
Pt is insistent on getting up out of bed and wanting to go to new room. The pt presses the call button several times in short time frame since being moved to Room 44.

## 2020-09-10 NOTE — ED Notes (Signed)
Pt yelling, this RN walked into room, pt standing beside of bed after taking all wires off. Pt assisted to [facility] (pt refused bedpan/purewick).  Pt tolerated well. Linen changed, pt assisted back into bed.

## 2020-09-10 NOTE — ED Notes (Signed)
Pt to CT scan.

## 2020-09-10 NOTE — ED Provider Notes (Signed)
  Physical Exam  BP (!) 173/56   Pulse 62   Resp 16   SpO2 98%   Physical Exam  ED Course/Procedures     Procedures  MDM  Received patient signout.  Facial droop.  Possible left-sided weakness.  Worsening mental status per patient's sister.  Confusion.  Last normal was reportedly this morning.  Had difficulty getting imaging.  Patient states she went Aleve but seems to confused at this point.  Discussed with patient's Sister [name] who thinks that patient needs placement.  Discussed with patient's granddaughter Ronney Asters [phone] who thinks that the patient can still live at at home.  Ty Somalia states that she takes care of her. Patient has a head CT that does show potential acute stroke.  Required some Haldol to be able to get it.  Creatinine now up to more than 5 recently 4.3 about 2 weeks ago and about 2 months ago was 3.1.  Believe patient require admission to the hospital.  Goals of care may need to be a discussion.  Not a TPA candidate due to time of onset and deficits. And the fact the patient is on Eliquis.      Davonna Belling, MD 09/10/20 1850

## 2020-09-10 NOTE — ED Triage Notes (Signed)
[facility] from home. Pt last known well at 0900. At 1000 family noticed left facial droop. Pt with left sided facial droop, speech somewhat slurred, pt states "I don't have my teeth in" Pt with slight strength deficit on left, hx of previous CVA left side. P

## 2020-09-11 ENCOUNTER — Inpatient Hospital Stay (HOSPITAL_COMMUNITY): Payer: Medicare Other

## 2020-09-11 DIAGNOSIS — I6389 Other cerebral infarction: Secondary | ICD-10-CM | POA: Diagnosis not present

## 2020-09-11 DIAGNOSIS — N189 Chronic kidney disease, unspecified: Secondary | ICD-10-CM

## 2020-09-11 DIAGNOSIS — D638 Anemia in other chronic diseases classified elsewhere: Secondary | ICD-10-CM

## 2020-09-11 DIAGNOSIS — I10 Essential (primary) hypertension: Secondary | ICD-10-CM

## 2020-09-11 DIAGNOSIS — G309 Alzheimer's disease, unspecified: Secondary | ICD-10-CM

## 2020-09-11 DIAGNOSIS — I639 Cerebral infarction, unspecified: Secondary | ICD-10-CM

## 2020-09-11 DIAGNOSIS — R2990 Unspecified symptoms and signs involving the nervous system: Secondary | ICD-10-CM

## 2020-09-11 DIAGNOSIS — N183 Chronic kidney disease, stage 3 unspecified: Secondary | ICD-10-CM

## 2020-09-11 DIAGNOSIS — I6300 Cerebral infarction due to thrombosis of unspecified precerebral artery: Secondary | ICD-10-CM

## 2020-09-11 DIAGNOSIS — F0280 Dementia in other diseases classified elsewhere without behavioral disturbance: Secondary | ICD-10-CM

## 2020-09-11 DIAGNOSIS — F0150 Vascular dementia without behavioral disturbance: Secondary | ICD-10-CM

## 2020-09-11 DIAGNOSIS — E1122 Type 2 diabetes mellitus with diabetic chronic kidney disease: Secondary | ICD-10-CM

## 2020-09-11 LAB — GLUCOSE, CAPILLARY
Glucose-Capillary: 182 mg/dL — ABNORMAL HIGH (ref 70–99)
Glucose-Capillary: 209 mg/dL — ABNORMAL HIGH (ref 70–99)
Glucose-Capillary: 64 mg/dL — ABNORMAL LOW (ref 70–99)
Glucose-Capillary: 175 mg/dL — ABNORMAL HIGH (ref 70–99)

## 2020-09-11 LAB — CBC
HCT: 22.7 % — ABNORMAL LOW (ref 36.0–46.0)
Hemoglobin: 7.8 g/dL — ABNORMAL LOW (ref 12.0–15.0)
MCH: 30 pg (ref 26.0–34.0)
MCHC: 34.4 g/dL (ref 30.0–36.0)
MCV: 87.3 fL (ref 80.0–100.0)
Platelets: 125 10*3/uL — ABNORMAL LOW (ref 150–400)
RBC: 2.6 MIL/uL — ABNORMAL LOW (ref 3.87–5.11)
RDW: 12.7 % (ref 11.5–15.5)
WBC: 4.5 10*3/uL (ref 4.0–10.5)
nRBC: 0 % (ref 0.0–0.2)

## 2020-09-11 LAB — ECHOCARDIOGRAM COMPLETE
AR max vel: 2.43 cm2
AV Peak grad: 13.1 mmHg
Ao pk vel: 1.81 m/s
Area-P 1/2: 3.03 cm2
S' Lateral: 2.8 cm

## 2020-09-11 LAB — BASIC METABOLIC PANEL
Anion gap: 7 (ref 5–15)
BUN: 41 mg/dL — ABNORMAL HIGH (ref 8–23)
CO2: 22 mmol/L (ref 22–32)
Calcium: 8.8 mg/dL — ABNORMAL LOW (ref 8.9–10.3)
Chloride: 110 mmol/L (ref 98–111)
Creatinine, Ser: 4.83 mg/dL — ABNORMAL HIGH (ref 0.44–1.00)
GFR, Estimated: 8 mL/min — ABNORMAL LOW (ref >60)
Glucose, Bld: 126 mg/dL — ABNORMAL HIGH (ref 70–99)
Potassium: 5 mmol/L (ref 3.5–5.1)
Sodium: 139 mmol/L (ref 135–145)

## 2020-09-11 LAB — LIPID PANEL
Cholesterol: 87 mg/dL (ref 0–200)
HDL: 38 mg/dL — ABNORMAL LOW (ref >40)
LDL Cholesterol: 30 mg/dL (ref 0–99)
Total CHOL/HDL Ratio: 2.3 RATIO
Triglycerides: 95 mg/dL (ref <150)
VLDL: 19 mg/dL (ref 0–40)

## 2020-09-11 LAB — HEMOGLOBIN A1C
Hgb A1c MFr Bld: 6.8 % — ABNORMAL HIGH (ref 4.8–5.6)
Mean Plasma Glucose: 148.46 mg/dL

## 2020-09-11 MED ORDER — ACETAMINOPHEN 650 MG RE SUPP: 650.0000 mg | RECTAL | Status: DC | PRN

## 2020-09-11 MED ORDER — INSULIN ASPART 100 UNIT/ML ~~LOC~~ SOLN: 0.0000 [IU] | Freq: Every day | SUBCUTANEOUS | Status: DC

## 2020-09-11 MED ORDER — SENNOSIDES-DOCUSATE SODIUM 8.6-50 MG PO TABS: 1.0000 | ORAL_TABLET | Freq: Every evening | ORAL | Status: DC | PRN

## 2020-09-11 MED ORDER — MEMANTINE HCL 10 MG PO TABS
5.0000 mg | ORAL_TABLET | Freq: Two times a day (BID) | ORAL | Status: DC
  Administered 2020-09-11 – 2020-09-14 (×7): 5 mg via ORAL
  Filled 2020-09-11 (×7): qty 1

## 2020-09-11 MED ORDER — SODIUM CHLORIDE 0.45 % IV SOLN: INTRAVENOUS | Status: DC

## 2020-09-11 MED ORDER — STROKE: EARLY STAGES OF RECOVERY BOOK
Freq: Once | Status: AC
  Filled 2020-09-11: qty 1

## 2020-09-11 MED ORDER — ATORVASTATIN CALCIUM 40 MG PO TABS
40.0000 mg | ORAL_TABLET | Freq: Every day | ORAL | Status: DC
  Administered 2020-09-11 – 2020-09-14 (×4): 40 mg via ORAL
  Filled 2020-09-11 (×4): qty 1

## 2020-09-11 MED ORDER — THIAMINE HCL 100 MG PO TABS
100.0000 mg | ORAL_TABLET | Freq: Two times a day (BID) | ORAL | Status: DC
Start: 2020-09-11 — End: 2020-09-14
  Administered 2020-09-11 – 2020-09-14 (×7): 100 mg via ORAL
  Filled 2020-09-11 (×7): qty 1

## 2020-09-11 MED ORDER — EZETIMIBE 10 MG PO TABS
10.0000 mg | ORAL_TABLET | Freq: Every day | ORAL | Status: DC
  Administered 2020-09-11 – 2020-09-14 (×4): 10 mg via ORAL
  Filled 2020-09-11 (×4): qty 1

## 2020-09-11 MED ORDER — APIXABAN 2.5 MG PO TABS
2.5000 mg | ORAL_TABLET | Freq: Two times a day (BID) | ORAL | Status: DC
  Administered 2020-09-11 – 2020-09-14 (×7): 2.5 mg via ORAL
  Filled 2020-09-11 (×7): qty 1

## 2020-09-11 MED ORDER — TAB-A-VITE/IRON PO TABS
1.0000 | ORAL_TABLET | Freq: Every day | ORAL | Status: DC
  Administered 2020-09-11 – 2020-09-14 (×4): 1 via ORAL
  Filled 2020-09-11 (×4): qty 1

## 2020-09-11 MED ORDER — SODIUM CHLORIDE 0.9 % IV SOLN: INTRAVENOUS | Status: DC

## 2020-09-11 MED ORDER — LORAZEPAM 2 MG/ML IJ SOLN
1.0000 mg | Freq: Once | INTRAMUSCULAR | Status: AC
  Administered 2020-09-11: 1 mg via INTRAVENOUS
  Filled 2020-09-11: qty 1

## 2020-09-11 MED ORDER — HYDRALAZINE HCL 50 MG PO TABS
50.0000 mg | ORAL_TABLET | Freq: Two times a day (BID) | ORAL | Status: DC
  Administered 2020-09-11 – 2020-09-14 (×7): 50 mg via ORAL
  Filled 2020-09-11 (×7): qty 1

## 2020-09-11 MED ORDER — ACETAMINOPHEN 160 MG/5ML PO SOLN: 650.0000 mg | ORAL | Status: DC | PRN

## 2020-09-11 MED ORDER — ACETAMINOPHEN 325 MG PO TABS: 650.0000 mg | ORAL_TABLET | ORAL | Status: DC | PRN

## 2020-09-11 MED ORDER — INSULIN ASPART 100 UNIT/ML ~~LOC~~ SOLN
0.0000 [IU] | Freq: Three times a day (TID) | SUBCUTANEOUS | Status: DC
  Administered 2020-09-11: 2 [IU] via SUBCUTANEOUS
  Administered 2020-09-11: 3 [IU] via SUBCUTANEOUS
  Administered 2020-09-12: 1 [IU] via SUBCUTANEOUS
  Administered 2020-09-13: 5 [IU] via SUBCUTANEOUS
  Administered 2020-09-13: 2 [IU] via SUBCUTANEOUS

## 2020-09-11 MED ORDER — CARVEDILOL 6.25 MG PO TABS
6.2500 mg | ORAL_TABLET | Freq: Two times a day (BID) | ORAL | Status: DC
  Administered 2020-09-11 – 2020-09-14 (×7): 6.25 mg via ORAL
  Filled 2020-09-11 (×7): qty 1

## 2020-09-11 MED ORDER — AMLODIPINE BESYLATE 10 MG PO TABS
10.0000 mg | ORAL_TABLET | Freq: Every day | ORAL | Status: DC
  Administered 2020-09-11 – 2020-09-14 (×4): 10 mg via ORAL
  Filled 2020-09-11 (×4): qty 1

## 2020-09-11 NOTE — Progress Notes (Signed)
Was called to room by nurse who said she appears to have a worsened droop. From my assessment, the droop is still the same but she speech is much more slurred.  She is alert and oriented and answering questions appropriately.  NIHSS unchanged except speech more slurred.  Had been sleeping when I was called to the room. Said she feels fine.  Did notify Dr Cathlean Sauer.  She did wake up and when food was brought to room, did start to eat.

## 2020-09-11 NOTE — Progress Notes (Signed)
  Echocardiogram 2D Echocardiogram has been performed.  Brianna Dodson 09/11/2020, 2:11 PM

## 2020-09-11 NOTE — Evaluation (Signed)
Occupational Therapy Evaluation Patient Details Name: Brianna Dodson MRN: [id number] DOB: April 17, 1934 Today's Date: 09/11/2020    History of Present Illness 87 y/o female presented to ED on 3/11 with L facial droop, slurred speech, and L UE weakness. Awaiting results from MRI to rule out acute ischemic CVA. 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 Pt currently, Pt limited by decreased strength, decreased cognition and decreased ability to care for self. Pt with trouble sequencing and some scissoring of gait. Pt set-upA to maxA for ADL and minA for 30' in hallway with RW for mobility. Pt continues to have L facial droop. Inconsistencies for peripheral/depth perceptual vision.  Pt would benefit from continued OT skilled services for ADL, mobility and safety. OT following acutely.     Follow Up Recommendations  SNF;Supervision/Assistance - 24 hour;Home health OT    Equipment Recommendations  3 in 1 bedside commode    Recommendations for Other Services       Precautions / Restrictions Precautions Precautions: Fall Restrictions Weight Bearing Restrictions: No      Mobility Bed Mobility Overal bed mobility: Needs Assistance Bed Mobility: Supine to Sit;Sit to Supine     Supine to sit: Supervision Sit to supine: Supervision   General bed mobility comments: supervision for safety    Transfers Overall transfer level: Needs assistance Equipment used: Rolling walker (2 wheeled) Transfers: Sit to/from Stand Sit to Stand: Min guard         General transfer comment: assist for stability    Balance Overall balance assessment: Needs assistance Sitting-balance support: No upper extremity supported;Feet supported Sitting balance-Leahy Scale: Fair Sitting balance - Comments: close supervision for static sitting EOB   Standing balance  support: Bilateral upper extremity supported;During functional activity Standing balance-Leahy Scale: Poor Standing balance comment: reliant on UE support and external assist to maintain balance                           ADL either performed or assessed with clinical judgement   ADL Overall ADL's : Needs assistance/impaired Eating/Feeding: Set up;Sitting   Grooming: Min guard;Sitting   Upper Body Bathing: Minimal assistance;Sitting   Lower Body Bathing: Maximal assistance;Sitting/lateral leans;Sit to/from stand;Cueing for safety   Upper Body Dressing : Minimal assistance;Sitting   Lower Body Dressing: Maximal assistance;Sit to/from stand;Sitting/lateral leans   Toilet Transfer: Minimal assistance;Ambulation;Regular Toilet   Toileting- Clothing Manipulation and Hygiene: Sit to/from stand;Minimal assistance;Sitting/lateral lean;Cueing for safety;Cueing for sequencing       Functional mobility during ADLs: Minimal assistance;Rolling walker;Cueing for safety;Cueing for sequencing General ADL Comments: Pt limited by decreased strength, decreased cognition and decreased ability to care for self.     Vision Baseline Vision/History: No visual deficits Patient Visual Report: No change from baseline Vision Assessment?: Yes Eye Alignment: Within Functional Limits Ocular Range of Motion: Within Functional Limits Alignment/Gaze Preference: Within Defined Limits Additional Comments: some inconsistencies with depth perception     Perception     Praxis      Pertinent Vitals/Pain Pain Assessment: No/denies pain     Hand Dominance Right   Extremity/Trunk Assessment Upper Extremity Assessment Upper Extremity Assessment: RUE deficits/detail;Generalized weakness;LUE deficits/detail RUE Deficits / Details: AROM, WFLs; 4/5 MM gradea LUE Deficits / Details: AROM, WFLs; 4/5 MM gradea   Lower Extremity Assessment Lower Extremity Assessment: Generalized weakness;Defer  to PT  evaluation   Cervical / Trunk Assessment Cervical / Trunk Assessment: Normal   Communication Communication Communication: Expressive difficulties (slurred speech)   Cognition Arousal/Alertness: Awake/alert Behavior During Therapy: Impulsive Overall Cognitive Status: History of cognitive impairments - at baseline                                 General Comments: no family/caregiver present to determine baseline cognitive functioning   General Comments  cues and constant sequencing cues    Exercises     Shoulder Instructions      Home Living Family/patient expects to be discharged to:: Private residence Living Arrangements: Spouse/significant other;Other (Comment) (granddaughter) Available Help at Discharge: Family;Available 24 hours/day Type of Home: House Home Access: Stairs to enter [employer] of Steps: 2 Entrance Stairs-Rails: Can reach both Home Layout: Two level;Bed/bath upstairs Alternate Level Stairs-Number of Steps: 15   Bathroom Shower/Tub: Teacher, early years/pre: Standard     Home Equipment: None          Prior Functioning/Environment Level of Independence: Needs assistance  Gait / Transfers Assistance Needed: no AD; managing stairs to bedroom each day ADL's / Homemaking Assistance Needed: assist for bathing from GD and paid house cleaner   Comments: Pt reports. Unreliable historian due to hx of dementia        OT Problem List: Decreased strength;Decreased activity tolerance;Impaired balance (sitting and/or standing);Impaired vision/perception;Decreased coordination;Decreased cognition;Decreased safety awareness      OT Treatment/Interventions: Self-care/ADL training;Therapeutic exercise;Neuromuscular education;Energy conservation;DME and/or AE instruction;Therapeutic activities;Cognitive remediation/compensation;Patient/family education;Balance training;Visual/perceptual remediation/compensation    OT  Goals(Current goals can be found in the care plan section) Acute Rehab OT Goals Patient Stated Goal: did not state OT Goal Formulation: Patient unable to participate in goal setting Time For Goal Achievement: 09/25/20 Potential to Achieve Goals: Good ADL Goals Pt Will Perform Grooming: with set-up;standing Pt Will Perform Lower Body Dressing: with min guard assist;sit to/from stand Pt/caregiver will Perform Home Exercise Program: Increased strength;Both right and left upper extremity Additional ADL Goal #1: Pt will increase to following all 1 step commands with minimal cues to attend to a task Additional ADL Goal #2: pt will increase to supervisionA overall for OOB ADL in sitting/standing to increase independence.  OT Frequency: Min 2X/week   Barriers to D/C:            Co-evaluation              AM-PAC OT "6 Clicks" Daily Activity     Outcome Measure Help from another person eating meals?: A Little Help from another person taking care of personal grooming?: A Little Help from another person toileting, which includes using toliet, bedpan, or urinal?: A Little Help from another person bathing (including washing, rinsing, drying)?: A Lot Help from another person to put on and taking off regular upper body clothing?: A Little Help from another person to put on and taking off regular lower body clothing?: A Lot 6 Click Score: 16   End of Session Equipment Utilized During Treatment: Gait belt;Rolling walker Nurse Communication: Mobility status  Activity Tolerance: Patient tolerated treatment well Patient left: in bed;with call bell/phone within reach;with bed alarm set  OT Visit Diagnosis: Unsteadiness on feet (R26.81);Muscle weakness (generalized) (M62.81);Other symptoms and signs involving cognitive function                Time: 3532-9924 OT Time Calculation (min): 18 min Charges:  OT General Charges $OT Visit: 1 Visit OT Evaluation $OT Eval Moderate Complexity: 1  Mod  .Jefferey Pica, OTR/L Acute Rehabilitation Services Pager: [phone] Office: [place] 09/11/2020, 3:02 PM

## 2020-09-11 NOTE — Progress Notes (Signed)
Pt admitted to the unit from ED. Pt alert and verbally responsive and oriented to the unit and room, pt follows command appropriately, VSS, telemetry applied and verified with CCMD and NT called to second verify. Skin assessment completed per protocol and no pressure ulcer or opened wounds noted. Pt in bed with call light within reach and bed alarm on. Will closely monitor pt.    09/11/20 0137  Vitals  Temp 98.1 F (36.7 C)  Temp Source Oral  BP (!) 164/61  MAP (mmHg) 92  BP Location Left Arm  BP Method Automatic  Patient Position (if appropriate) Lying  Pulse Rate 75  Pulse Rate Source Monitor  Resp 18  Level of Consciousness  Level of Consciousness Alert  MEWS COLOR  MEWS Score Color Green  Oxygen Therapy  SpO2 99 %  O2 Device Room Air  PCA/Epidural/Spinal Assessment  Respiratory Pattern Regular;Unlabored  ECG Monitoring  Cardiac Rhythm NSR  MEWS Score  MEWS Temp 0  MEWS Systolic 0  MEWS Pulse 0  MEWS RR 0  MEWS LOC 0  MEWS Score 0

## 2020-09-11 NOTE — Progress Notes (Signed)
Currently at bedside. Patient does not seem to be in distress.

## 2020-09-11 NOTE — Progress Notes (Signed)
PROGRESS NOTE    Brianna Dodson  MVH:[id number] DOB: 1934/05/18 DOA: 09/10/2020 PCP: [name], MD    Brief Narrative:  Brianna Dodson was admitted to the hospital with working diagnosis of TIA/CVA.  87 year old female with past medical history for hypertension, dyslipidemia, type 2 diabetes mellitus, history of CVA, chronic kidney disease stage IV and dementia.  She was brought to the hospital due to facial droop, left-sided weakness and worsening mentation.  Apparently patient had a sudden change in mentation, while walking she suddenly dropped her purse and bag of food, she seemed to be confused.  Few minutes later she was found to have left-sided facial droop and worsening confusion/agitation, that prompted her to come to he hospital. On her initial physical examination blood pressure 145/67, heart rate 72, respiratory rate 18, oxygen saturation 99%, her lungs were clear to auscultation bilaterally, heart S1-S2, present rhythmic, soft abdomen, no lower extremity edema, her strength was diminished 4/5 in all 4 limbs, she was awake and alert.  Head CT with substantial increase encephalomalacia of posterior right MCA territory, right temporal occipital junction hypodensity, suspicion for acute to subacute infarction.  No evidence of hemorrhage.  EKG 62 bpm, left axis deviation, left anterior fascicular block, sinus rhythm, no ST segment or T wave changes.    Assessment & Plan:   Principal Problem:   CVA (cerebral vascular accident) ([place]) Active Problems:   Essential hypertension   DM type 2 (diabetes mellitus, type 2) ([place])   Mixed Alzheimer's and vascular dementia ([place])   Acute kidney injury superimposed on CKD ([place])   Anemia of chronic disease   1. Acute CVA. Patient continue to have right facial droop and decrease strength on the right side 3/5 upper and 4/5 lower extremities.   Had MRI this am, pending report. (had one dose of haldol and lorazepam to allow imaging) Now  allowed to eat per speech recommendations.  Follow up with further work up with echocardiogram, and CTA head and neck.   LDL is 30 with HDL 38. On atorvastatin   Continue anticoagulation with apixaban, blood pressure control and statin therapy. Follow further recommendations, from PT, OT and neurology.  2. AKI on CKD stage 4/ hyperkalemia. Renal function with serum cr at 4,83 with K at 5,0 and bicarbonate at 22.  NA is 139 and Cl at 110, BUN is 41.  Her baseline cr between 2 and 3, old records personally reviewed.  Change IV fluids to half normal saline at 50 ml per hr to prevent worsening hypernatremia and hyperchloremia.   Follow up on renal function in am. Avoid hypotension and nephrotoxic medications.,   3. HTN. Blood pressure 841 mmHg systolic this am. Continue blood pressure control with amlodipine, hydralazine and carvedilol.   4. T2DM/ dyslipidemia glucose remain controlled, this am fasting glucose 126 mg/dl, continue sliding scale for glucose cover and monitoring.   Continue with atorvastatin/ ezetimibe.   5. Dementia, vascular and alzheimer's significant encephalomalacia, continue neuro checks per unit protocol. Add thiamine and multivitamins, consult nutrition for evaluation.    Continue with memantine.   6. Anemia of chronic disease. Cell count has been stable, no criteria for prbc transfusion.   Patient continue to be at high risk for worsening CVA   Status is: Inpatient  Remains inpatient appropriate because:Inpatient level of care appropriate due to severity of illness   Dispo: The patient is from: Home              Anticipated d/c is to:  Home              Patient currently is not medically stable to d/c.   Difficult to place patient No    DVT prophylaxis: apixaban   Code Status:    full  Family Communication:  No family at the bedside      Consultants:   Neurology     Subjective: Patient has mild somnolence but easy to arouse, no chest pain, no  nausea or vomiting, no dyspnea, positive right sided weakness.   Objective: Vitals:   09/11/20 0137 09/11/20 0343 09/11/20 0700 09/11/20 1120  BP: (!) 164/61 (!) 130/55 (!) 160/64 (!) 138/56  Pulse: 75 68  62  Resp: 18 18 18 18   Temp: 98.1 F (36.7 C) 97.8 F (36.6 C) 98.4 F (36.9 C) 97.9 F (36.6 C)  TempSrc: Oral Oral Oral Oral  SpO2: 99% 98% 99% 99%    Intake/Output Summary (Last 24 hours) at 09/11/2020 1134 Last data filed at 09/11/2020 1057 Gross per 24 hour  Intake 365.21 ml  Output --  Net 365.21 ml   There were no vitals filed for this visit.  Examination:   General: Not in pain or dyspnea, deconditioned  Neurology: somnolent, but easy to arouse, follows commands and answers to simple questions. Strength 3/5 right upper and 4/5 right lower extremity compared to left. Positive right sided facial droop.  E ENT: no pallor, no icterus, oral mucosa moist Cardiovascular: No JVD. S1-S2 present, rhythmic, no gallops, rubs, or murmurs. No lower extremity edema. Pulmonary: poaitive breath sounds bilaterally, adequate air movement, no wheezing, rhonchi or rales. Gastrointestinal. Abdomen soft and non tender Skin. No rashes Musculoskeletal: no joint deformities     Data Reviewed: I have personally reviewed following labs and imaging studies  CBC: Recent Labs  Lab 09/10/20 1457 09/11/20 0223  WBC 4.6 4.5  NEUTROABS 2.9  --   HGB 8.3* 7.8*  HCT 24.9* 22.7*  MCV 89.9 87.3  PLT 147* 564*   Basic Metabolic Panel: Recent Labs  Lab 09/10/20 1457 09/11/20 0223  NA 137 139  K 5.2* 5.0  CL 108 110  CO2 22 22  GLUCOSE 146* 126*  BUN 44* 41*  CREATININE 5.17* 4.83*  CALCIUM 8.6* 8.8*   GFR: Estimated Creatinine Clearance: 9 mL/min (A) (by C-G formula based on SCr of 4.83 mg/dL (H)). Liver Function Tests: No results for input(s): AST, ALT, ALKPHOS, BILITOT, PROT, ALBUMIN in the last 168 hours. No results for input(s): LIPASE, AMYLASE in the last 168 hours. No  results for input(s): AMMONIA in the last 168 hours. Coagulation Profile: No results for input(s): INR, PROTIME in the last 168 hours. Cardiac Enzymes: No results for input(s): CKTOTAL, CKMB, CKMBINDEX, TROPONINI in the last 168 hours. BNP (last 3 results) No results for input(s): PROBNP in the last 8760 hours. HbA1C: Recent Labs    09/11/20 0223  HGBA1C 6.8*   CBG: No results for input(s): GLUCAP in the last 168 hours. Lipid Profile: Recent Labs    09/11/20 0223  CHOL 87  HDL 38*  LDLCALC 30  TRIG 95  CHOLHDL 2.3   Thyroid Function Tests: No results for input(s): TSH, T4TOTAL, FREET4, T3FREE, THYROIDAB in the last 72 hours. Anemia Panel: No results for input(s): VITAMINB12, FOLATE, FERRITIN, TIBC, IRON, RETICCTPCT in the last 72 hours.    Radiology Studies: I have reviewed all of the imaging during this hospital visit personally     Scheduled Meds: . amLODipine  10 mg Oral Daily  .  apixaban  2.5 mg Oral BID  . atorvastatin  40 mg Oral Daily  . carvedilol  6.25 mg Oral BID WC  . ezetimibe  10 mg Oral Daily  . hydrALAZINE  50 mg Oral BID  . insulin aspart  0-5 Units Subcutaneous QHS  . insulin aspart  0-9 Units Subcutaneous TID WC  . memantine  5 mg Oral BID   Continuous Infusions: . sodium chloride 100 mL/hr at 09/11/20 0654     LOS: 1 day        Zylee Marchiano Gerome Apley, MD

## 2020-09-11 NOTE — Evaluation (Signed)
Physical Therapy Evaluation Patient Details Name: Brianna Dodson MRN: [id number] DOB: 02-Aug-1933 Today's Date: 09/11/2020   History of Present Illness  87 y/o female presented to ED on 3/11 with L facial droop, slurred speech, and L UE weakness. Awaiting results from MRI to rule out acute ischemic CVA. PMH: R MCA 08/2019, CKD stage III, DM, HLD, HTN, dementia  Clinical Impression  History provided by patient, however unsure of reliability due to hx of dementia and no family present to provide baseline cognitive functioning. Patient with decreased awareness of safety and deficits as patient disoriented to situation this session. Attempt to reorient with patient not receptive. Patient impulsive with mobility and demonstrates L inattention and decreased visual scanning to L environment during ambulation. Patient requires minA for ambulation with RW for balance and RW management as patient tends to not push L side of RW. Patient presents with generalized weakness, impaired balance, decreased activity tolerance, impaired cognition, decreased safety awareness. Patient will benefit from skilled PT services during acute stay to address listed deficits. SNF vs HHPT for d/c planning pending patient progress and family availability to provide necessary assistance/supervision at d/c.     Follow Up Recommendations SNF;Home health PT (pending progress and family availability to provide assistance/supervision at d/c)    Equipment Recommendations  Rolling Dione Petron with 5" wheels    Recommendations for Other Services       Precautions / Restrictions Precautions Precautions: Fall Restrictions Weight Bearing Restrictions: No      Mobility  Bed Mobility Overal bed mobility: Needs Assistance Bed Mobility: Supine to Sit;Sit to Supine     Supine to sit: Supervision Sit to supine: Supervision   General bed mobility comments: supervision for safety    Transfers Overall transfer level: Needs  assistance Equipment used: Rolling Rada Zegers (2 wheeled) Transfers: Sit to/from Stand Sit to Stand: Min guard         General transfer comment: min guard for safety, unsteady upon standing with patient able to correct  Ambulation/Gait Ambulation/Gait assistance: Min assist Gait Distance (Feet): 75 Feet Assistive device: Rolling Hanan Moen (2 wheeled) Gait Pattern/deviations: Step-through pattern;Decreased stride length;Drifts right/left;Wide base of support Gait velocity: decreased   General Gait Details: L inattention with ambulation with running into L door frame and not pushing both sides of RW simultaneously. Staggering L throughout ambulation with minA to recover.  Stairs            Wheelchair Mobility    Modified Rankin (Stroke Patients Only) Modified Rankin (Stroke Patients Only) Pre-Morbid Rankin Score: Moderate disability Modified Rankin: Moderately severe disability     Balance Overall balance assessment: Needs assistance Sitting-balance support: No upper extremity supported;Feet supported Sitting balance-Leahy Scale: Fair Sitting balance - Comments: close supervision for static sitting EOB   Standing balance support: Bilateral upper extremity supported;During functional activity Standing balance-Leahy Scale: Poor Standing balance comment: reliant on UE support and external assist to maintain balance                             Pertinent Vitals/Pain Pain Assessment: No/denies pain    Home Living Family/patient expects to be discharged to:: Private residence Living Arrangements: Spouse/significant other;Other (Comment) (granddaughter) Available Help at Discharge: Family;Available 24 hours/day Type of Home: House Home Access: Stairs to enter Entrance Stairs-Rails: Can reach both Entrance Stairs-Number of Steps: 2 Home Layout: Two level;Bed/bath upstairs Home Equipment: None      Prior Function Level of Independence: Needs assistance   Gait /  Transfers Assistance Needed: no AD; managing stairs to bedroom each day  ADL's / Homemaking Assistance Needed: assist for bathing from GD and paid house cleaner  Comments: Pt reports. Unreliable historian due to hx of dementia     Hand Dominance   Dominant Hand: Right    Extremity/Trunk Assessment   Upper Extremity Assessment Upper Extremity Assessment: Defer to OT evaluation    Lower Extremity Assessment Lower Extremity Assessment: Generalized weakness (Bilateral LEs grossly 3/5)       Communication   Communication: Expressive difficulties (slurred speech)  Cognition Arousal/Alertness: Awake/alert Behavior During Therapy: Impulsive Overall Cognitive Status: History of cognitive impairments - at baseline                                 General Comments: no family/caregiver present to determine baseline cognitive functioning      General Comments      Exercises     Assessment/Plan    PT Assessment Patient needs continued PT services  PT Problem List Decreased strength;Decreased activity tolerance;Decreased balance;Decreased mobility;Decreased cognition;Decreased coordination;Decreased safety awareness;Decreased knowledge of use of DME;Impaired sensation       PT Treatment Interventions DME instruction;Gait training;Stair training;Functional mobility training;Therapeutic activities;Therapeutic exercise;Balance training;Patient/family education    PT Goals (Current goals can be found in the Care Plan section)  Acute Rehab PT Goals Patient Stated Goal: did not state PT Goal Formulation: Patient unable to participate in goal setting Time For Goal Achievement: 09/25/20 Potential to Achieve Goals: Fair    Frequency Min 3X/week   Barriers to discharge        Co-evaluation               AM-PAC PT "6 Clicks" Mobility  Outcome Measure Help needed turning from your back to your side while in a flat bed without using bedrails?: A Little Help  needed moving from lying on your back to sitting on the side of a flat bed without using bedrails?: A Little Help needed moving to and from a bed to a chair (including a wheelchair)?: A Little Help needed standing up from a chair using your arms (e.g., wheelchair or bedside chair)?: A Little Help needed to walk in hospital room?: A Little Help needed climbing 3-5 steps with a railing? : A Little 6 Click Score: 18    End of Session Equipment Utilized During Treatment: Gait belt Activity Tolerance: Patient tolerated treatment well Patient left: in bed;with call bell/phone within reach;with bed alarm set Nurse Communication: Mobility status PT Visit Diagnosis: Unsteadiness on feet (R26.81);Muscle weakness (generalized) (M62.81);Other abnormalities of gait and mobility (R26.89)    Time: 2878-6767 PT Time Calculation (min) (ACUTE ONLY): 18 min   Charges:   PT Evaluation $PT Eval Moderate Complexity: 1 Mod          Alliene Klugh A. Gilford Rile PT, DPT Acute Rehabilitation Services Pager [phone] Office [phone]   Linna Hoff 09/11/2020, 11:59 AM
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
## 2020-09-11 NOTE — Progress Notes (Signed)
VASCULAR LAB    Carotid duplex has been performed.  See CV proc for preliminary results.   Franny Selvage, RVT 09/11/2020, 2:25 PM

## 2020-09-11 NOTE — Discharge Instructions (Signed)

## 2020-09-11 NOTE — Progress Notes (Signed)
Called provider, MD returned call and confirmed assessment was same as his previously.  Discussion about precipitating factors. (Haldol last night, ativan AM, and sleeping most of the day.) MD said continue to monitor and notify with condition changes.

## 2020-09-11 NOTE — Evaluation (Signed)
Speech Language Pathology Evaluation Patient Details Name: Brianna Dodson MRN: [id number] DOB: March 02, 1934 Today's Date: 09/11/2020 Time: 4268-3419 SLP Time Calculation (min) (ACUTE ONLY): 23 min  Problem List:  Patient Active Problem List   Diagnosis Date Noted  . Acute kidney injury superimposed on CKD ([place]) 09/10/2020  . TIA (transient ischemic attack) 09/10/2020  . Anemia of chronic disease 09/10/2020  . CVA (cerebral vascular accident) ([place]) 09/10/2020  . Sleeping difficulty 07/08/2020  . Fatigue 06/09/2020  . Mixed Alzheimer's and vascular dementia ([place]) 06/09/2020  . Coccydynia 01/26/2020  . Leukopenia 12/08/2019  . Muscle strain 12/08/2019  . Pedal edema 11/26/2019  . Dysarthria and anarthria 11/07/2019  . Atrial fibrillation ([place]) 10/21/2019  . Nocturia 10/21/2019  . Seborrheic keratosis 10/21/2019  . Carotid stenosis 08/27/2019  . Thyroid nodule 08/27/2019  . Status post placement of implantable loop recorder 08/07/2019  . Anemia 04/18/2019  . Aphasia 04/12/2019  . Benign hypertensive renal disease 04/07/2019  . Hyperparathyroidism due to renal insufficiency ([place]) 04/07/2019  . 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 Proteinuria 04/07/2019  . History of stroke with current residual effects 03/15/2019  . Positional numbness and tingling in both hands, ulnar aspect 09/19/2018  . Hyperlipidemia associated with type 2 diabetes mellitus ([place]) 07/25/2018  . Contusion of left shoulder 04/11/2018  . Itching 10/23/2017  . Nevus 04/17/2017  . CKD stage 4 due to type 2 diabetes mellitus ([place]) 02/02/2015  . Vitamin D deficiency 02/02/2015  . Obesity (BMI 30-39.9) 08/03/2014  . DM type 2 (diabetes mellitus, type 2) ([place]) 01/29/2014  . Essential hypertension 01/13/2014  . HLD (hyperlipidemia) 01/13/2014   Past Medical History:  Past Medical History:  Diagnosis Date  . CKD (chronic kidney  disease), stage III ([place])   . Diabetes mellitus without complication ([place])   . History of blood transfusion   . Hyperlipidemia   . Hypertension   . Stroke [facility])    Past Surgical History:  Past Surgical History:  Procedure Laterality Date  . ABDOMINAL HYSTERECTOMY  1985  . LOOP RECORDER INSERTION N/A 07/30/2019   Procedure: LOOP RECORDER INSERTION;  Surgeon: Isaias Cowman, MD;  Location: [facility] CV LAB;  Service: Cardiovascular;  Laterality: N/A;  . TEE WITHOUT CARDIOVERSION N/A 04/14/2019   Procedure: TRANSESOPHAGEAL ECHOCARDIOGRAM (TEE);  Surgeon: Teodoro Spray, MD;  Location: [facility] ORS;  Service: Cardiovascular;  Laterality: N/A;   HPI:  Brianna Dodson is a 87 y.o. female with medical history significant for HTN, HLD, DMT2, previous CVA, dementia, CKD 4 who presents by EMS for possible stroke with facial droop, left sided weakness and worsening mental status. Substantially increased encephalomalacia of posterior right MCA territory, generally in keeping with expected evolution of acute diffusion restricting infarction identified on priors dated 08/20/2019. The most posterior region of hypodensity, in the right temporal occipital junction does not appear to have been involved on prior MR and is suspicious for an acute to subacute infarction. MRI pending.   Assessment / Plan / Recommendation Clinical Impression  Patient presents with moderate dysarthria due to left sided facial weakness, decreased awareness of deficits and how they impact function, impulsivity, and decreased memory of recent events. Note that when seen by SLP in May of 2021 following previous CVA, dysarhtira was also noted however possibly less severe, and no notation of cognitive deficits found in documentation. Unclear with cognitive deficits are new in nature related to possibly acute CVA or baseline dementia. No caregiver  present to confirm. Patient will benefit from skilled SLP treatment to address deficits,  maximizing communication and ability to complete ADLs as independently as possible.  Patient states that she does have family assistance at home.    SLP Assessment  SLP Recommendation/Assessment: Patient needs continued Speech Lanaguage Pathology Services SLP Visit Diagnosis: Dysarthria and anarthria (R47.1)    Follow Up Recommendations  Home health SLP    Frequency and Duration min 2x/week  2 weeks      SLP Evaluation Cognition  Overall Cognitive Status: History of cognitive impairments - at baseline Arousal/Alertness: Awake/alert Orientation Level: Oriented to person;Oriented to place;Disoriented to time;Disoriented to situation Attention: Sustained Sustained Attention: Appears intact Memory: Impaired Memory Impairment: Decreased recall of new information;Decreased short term memory Awareness: Impaired Awareness Impairment: Intellectual impairment;Anticipatory impairment Problem Solving: Impaired Problem Solving Impairment: Functional complex Behaviors: Impulsive Safety/Judgment: Impaired       Comprehension  Auditory Comprehension Overall Auditory Comprehension: Appears within functional limits for tasks assessed Visual Recognition/Discrimination Discrimination: Not tested Reading Comprehension Reading Status: Not tested    Expression Expression Primary Mode of Expression: Verbal Verbal Expression Overall Verbal Expression: Appears within functional limits for tasks assessed Written Expression Dominant Hand: Right   Oral / Motor  Motor Speech Overall Motor Speech: Impaired (also impaired at baseline) Respiration: Within functional limits Phonation: Normal Resonance: Within functional limits Articulation: Impaired Level of Impairment: Word Intelligibility: Intelligibility reduced Word: 25-49% accurate Phrase: 25-49% accurate Sentence: 25-49% accurate Conversation: 25-49% accurate Motor Planning: Witnin functional limits Interfering Components: Premorbid  status;Inadequate dentition Effective Techniques: Over-articulate               Brianna Wurtz MA, CCC-SLP           Brianna Dodson Brianna Dodson 09/11/2020, 11:55 AM

## 2020-09-12 ENCOUNTER — Inpatient Hospital Stay (HOSPITAL_COMMUNITY): Payer: Medicare Other

## 2020-09-12 LAB — BASIC METABOLIC PANEL
Anion gap: 6 (ref 5–15)
BUN: 40 mg/dL — ABNORMAL HIGH (ref 8–23)
CO2: 21 mmol/L — ABNORMAL LOW (ref 22–32)
Calcium: 8 mg/dL — ABNORMAL LOW (ref 8.9–10.3)
Chloride: 108 mmol/L (ref 98–111)
Creatinine, Ser: 4.9 mg/dL — ABNORMAL HIGH (ref 0.44–1.00)
GFR, Estimated: 8 mL/min — ABNORMAL LOW (ref >60)
Glucose, Bld: 73 mg/dL (ref 70–99)
Potassium: 5.1 mmol/L (ref 3.5–5.1)
Sodium: 135 mmol/L (ref 135–145)

## 2020-09-12 LAB — GLUCOSE, CAPILLARY
Glucose-Capillary: 106 mg/dL — ABNORMAL HIGH (ref 70–99)
Glucose-Capillary: 116 mg/dL — ABNORMAL HIGH (ref 70–99)
Glucose-Capillary: 128 mg/dL — ABNORMAL HIGH (ref 70–99)
Glucose-Capillary: 99 mg/dL (ref 70–99)

## 2020-09-12 MED ORDER — HALOPERIDOL LACTATE 5 MG/ML IJ SOLN: 1.0000 mg | Freq: Four times a day (QID) | INTRAMUSCULAR | Status: DC | PRN

## 2020-09-12 MED ORDER — SODIUM ZIRCONIUM CYCLOSILICATE 10 G PO PACK
10.0000 g | PACK | Freq: Two times a day (BID) | ORAL | Status: AC
  Administered 2020-09-12 (×2): 10 g via ORAL
  Filled 2020-09-12 (×2): qty 1

## 2020-09-12 MED ORDER — HALOPERIDOL 0.5 MG PO TABS
1.0000 mg | ORAL_TABLET | Freq: Four times a day (QID) | ORAL | Status: DC | PRN
  Administered 2020-09-12 – 2020-09-13 (×2): 1 mg via ORAL
  Filled 2020-09-12 (×2): qty 2

## 2020-09-12 NOTE — Plan of Care (Signed)
  Problem: Education: Goal: Knowledge of General Education information will improve Description: Including pain rating scale, medication(s)/side effects and non-pharmacologic comfort measures Outcome: Progressing   Problem: Health Behavior/Discharge Planning: Goal: Ability to manage health-related needs will improve Outcome: Progressing   Problem: Clinical Measurements: Goal: Ability to maintain clinical measurements within normal limits will improve Outcome: Progressing Goal: Will remain free from infection Outcome: Progressing Goal: Diagnostic test results will improve Outcome: Progressing Goal: Respiratory complications will improve Outcome: Progressing Goal: Cardiovascular complication will be avoided Outcome: Progressing   Problem: Activity: Goal: Risk for activity intolerance will decrease Outcome: Progressing   Problem: Nutrition: Goal: Adequate nutrition will be maintained Outcome: Progressing   Problem: Coping: Goal: Level of anxiety will decrease Outcome: Progressing   Problem: Elimination: Goal: Will not experience complications related to bowel motility Outcome: Progressing Goal: Will not experience complications related to urinary retention Outcome: Progressing   Problem: Pain Managment: Goal: General experience of comfort will improve Outcome: Progressing   Problem: Safety: Goal: Ability to remain free from injury will improve Outcome: Progressing   Problem: Skin Integrity: Goal: Risk for impaired skin integrity will decrease Outcome: Progressing   Problem: Education: Goal: Knowledge of disease or condition will improve Outcome: Progressing Goal: Knowledge of secondary prevention will improve Outcome: Progressing Goal: Knowledge of patient specific risk factors addressed and post discharge goals established will improve Outcome: Progressing Goal: Individualized Educational Video(s) Outcome: Progressing   Problem: Coping: Goal: Will verbalize  positive feelings about self Outcome: Progressing   Problem: Health Behavior/Discharge Planning: Goal: Ability to manage health-related needs will improve Outcome: Progressing   Problem: Self-Care: Goal: Ability to participate in self-care as condition permits will improve Outcome: Progressing   Problem: Ischemic Stroke/TIA Tissue Perfusion: Goal: Complications of ischemic stroke/TIA will be minimized Outcome: Progressing
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
## 2020-09-12 NOTE — Progress Notes (Signed)
She is constantly trying to get out of the bed and yelling at staff and her family and threatening to leave.  She was calm and cooperative earlier today, as she even had her MRI without any sedation or calming agent (when that was not the case previously because they were unable to do it because of her aggression and behavior).  She just thinks her family is trying to put her in a home and we are working with them to lock her away and is constantly trying to get up.  For her safety, we are going to move her closer to the nurses station, as she is very unsteady on her feet and is not aware that she is unsafe.  I did leave a message for the doctor to see if we can have something to assist and calm her.

## 2020-09-12 NOTE — Progress Notes (Addendum)
PROGRESS NOTE    Brianna Dodson  HKV:[id number] DOB: 05/25/1934 DOA: 09/10/2020 PCP: [name], MD    Brief Narrative:  Brianna Dodson was admitted to the hospital with working diagnosis of acute progression of old MCA infarct.   87 year old female with past medical history for hypertension, dyslipidemia, type 2 diabetes mellitus, history of CVA, chronic kidney disease stage IV and dementia.  She was brought to the hospital due to facial droop, left-sided weakness and worsening mentation.  Apparently patient had a sudden change in mentation, while walking she suddenly dropped her purse and bag of food, she seemed to be confused.  Few minutes later she was found to have left-sided facial droop and worsening confusion/agitation, that prompted her to come to he hospital. On her initial physical examination blood pressure 145/67, heart rate 72, respiratory rate 18, oxygen saturation 99%, her lungs were clear to auscultation bilaterally, heart S1-S2, present rhythmic, soft abdomen, no lower extremity edema, her strength was diminished 4/5 in all 4 limbs, she was awake and alert.  Head CT with substantial increase encephalomalacia of posterior right MCA territory, right temporal occipital junction hypodensity, suspicion for acute to subacute infarction.  No evidence of hemorrhage.  EKG 62 bpm, left axis deviation, left anterior fascicular block, sinus rhythm, no ST segment or T wave changes.    Assessment & Plan:   Principal Problem:   CVA (cerebral vascular accident) ([place]) Active Problems:   Essential hypertension   DM type 2 (diabetes mellitus, type 2) (HCC)   Mixed Alzheimer's and vascular dementia ([place])   Acute kidney injury superimposed on CKD ([place])   Anemia of chronic disease   Stroke-like episode    1. Acute CVA, progression of old CVA with acute ischemia at the anterior and posterior margins of old right MCA infarct. Today she is more awake and alert. Following commands,  positive weakness on the left side, not changed from yesterday.   Echocardiogram with preserved LV and Rv systolic function, no thrombus noted.   Continue medical management with atorvastatin and anticoagulation with apixaban. Discussed with neurology Dr Leonie Man,   PT/OT have recommended SNF, I discussed with her care giver and she agrees with this plan, patient herself would like to go home, but at this time it seems to be not safe.   2. AKI on CKD stage 4/ hyperkalemia. Renal function with serum cr at 4,90 with K at 5,1 and bicarbonate at 21.  Her baseline cr between 2 and 3, old records personally reviewed.   Will dc IV fluids and follow up renal function in am, add sodium zirconium 10 mg x2 doses and check electrolytes in am.  Improved po intake.    3. HTN. Continue blood pressure control with amlodipine, hydralazine and carvedilol.   4. T2DM/ dyslipidemia fasting glucose is 73 this am, continue with insulin sliding scale for glucose cover and monitoring  On atorvastatin/ ezetimibe.  .   5. Dementia, vascular and alzheimer's significant encephalomalacia, continue neuro checks per unit protocol. Continue with thiamine and multivitamins.     On memantine.   6. Anemia of chronic disease. Stable cell count.    Status is: Inpatient  Remains inpatient appropriate because:Inpatient level of care appropriate due to severity of illness   Dispo: The patient is from: Home              Anticipated d/c is to: SNF              Patient currently is medically stable  to d/c.   Difficult to place patient No  DVT prophylaxis: apixaban   Code Status:   full  Family Communication:  I spoke over the phone with the patient's sister about patient's  condition, plan of care, prognosis and all questions were addressed.     Consultants:   Neurology     Subjective: Patient is feeling better, but continue to be weak and deconditioned, not yet back to her baseline, no nausea or  vomiting.   Objective: Vitals:   09/12/20 0034 09/12/20 0326 09/12/20 0723 09/12/20 1139  BP: (!) 130/57 (!) 122/44 132/63 (!) 149/51  Pulse: 66 (!) 59 62 (!) 59  Resp: 18 18 18 18   Temp: 98.4 F (36.9 C) 98.3 F (36.8 C) (!) 97.2 F (36.2 C) (!) 97.3 F (36.3 C)  TempSrc: Oral Oral Oral Oral  SpO2: 100% 97% 100% 99%  Weight:      Height:        Intake/Output Summary (Last 24 hours) at 09/12/2020 1355 Last data filed at 09/12/2020 0600 Gross per 24 hour  Intake 1012.11 ml  Output 500 ml  Net 512.11 ml   Filed Weights   09/11/20 1829  Weight: 79.2 kg    Examination:   General: Not in pain or dyspnea, deconditioned  Neurology: Awake and alert, non focal/ left sided weakness unchanged.  E ENT: no pallor, no icterus, oral mucosa moist Cardiovascular: No JVD. S1-S2 present, rhythmic, no gallops, rubs, or murmurs. No lower extremity edema. Pulmonary: positive breath sounds bilaterally, adequate air movement, no wheezing, rhonchi or rales. Gastrointestinal. Abdomen soft and non tender Skin. No rashes Musculoskeletal: no joint deformities     Data Reviewed: I have personally reviewed following labs and imaging studies  CBC: Recent Labs  Lab 09/10/20 1457 09/11/20 0223  WBC 4.6 4.5  NEUTROABS 2.9  --   HGB 8.3* 7.8*  HCT 24.9* 22.7*  MCV 89.9 87.3  PLT 147* 932*   Basic Metabolic Panel: Recent Labs  Lab 09/10/20 1457 09/11/20 0223 09/12/20 0415  NA 137 139 135  K 5.2* 5.0 5.1  CL 108 110 108  CO2 22 22 21*  GLUCOSE 146* 126* 73  BUN 44* 41* 40*  CREATININE 5.17* 4.83* 4.90*  CALCIUM 8.6* 8.8* 8.0*   GFR: Estimated Creatinine Clearance: 8.9 mL/min (A) (by C-G formula based on SCr of 4.9 mg/dL (H)). Liver Function Tests: No results for input(s): AST, ALT, ALKPHOS, BILITOT, PROT, ALBUMIN in the last 168 hours. No results for input(s): LIPASE, AMYLASE in the last 168 hours. No results for input(s): AMMONIA in the last 168 hours. Coagulation  Profile: No results for input(s): INR, PROTIME in the last 168 hours. Cardiac Enzymes: No results for input(s): CKTOTAL, CKMB, CKMBINDEX, TROPONINI in the last 168 hours. BNP (last 3 results) No results for input(s): PROBNP in the last 8760 hours. HbA1C: Recent Labs    09/11/20 0223  HGBA1C 6.8*   CBG: Recent Labs  Lab 09/11/20 1611 09/11/20 2102 09/11/20 2234 09/12/20 0611 09/12/20 1142  GLUCAP 209* 64* 175* 106* 116*   Lipid Profile: Recent Labs    09/11/20 0223  CHOL 87  HDL 38*  LDLCALC 30  TRIG 95  CHOLHDL 2.3   Thyroid Function Tests: No results for input(s): TSH, T4TOTAL, FREET4, T3FREE, THYROIDAB in the last 72 hours. Anemia Panel: No results for input(s): VITAMINB12, FOLATE, FERRITIN, TIBC, IRON, RETICCTPCT in the last 72 hours.    Radiology Studies: I have reviewed all of the imaging during this  hospital visit personally     Scheduled Meds: . amLODipine  10 mg Oral Daily  . apixaban  2.5 mg Oral BID  . atorvastatin  40 mg Oral Daily  . carvedilol  6.25 mg Oral BID WC  . ezetimibe  10 mg Oral Daily  . hydrALAZINE  50 mg Oral BID  . insulin aspart  0-5 Units Subcutaneous QHS  . insulin aspart  0-9 Units Subcutaneous TID WC  . memantine  5 mg Oral BID  . multivitamins with iron  1 tablet Oral Daily  . thiamine  100 mg Oral BID   Continuous Infusions: . sodium chloride 50 mL/hr at 09/12/20 1008     LOS: 2 days        Lilou Kneip Gerome Apley, MD

## 2020-09-13 ENCOUNTER — Telehealth: Payer: Self-pay | Admitting: Family Medicine

## 2020-09-13 DIAGNOSIS — E440 Moderate protein-calorie malnutrition: Secondary | ICD-10-CM

## 2020-09-13 LAB — GLUCOSE, CAPILLARY
Glucose-Capillary: 193 mg/dL — ABNORMAL HIGH (ref 70–99)
Glucose-Capillary: 294 mg/dL — ABNORMAL HIGH (ref 70–99)
Glucose-Capillary: 180 mg/dL — ABNORMAL HIGH (ref 70–99)

## 2020-09-13 LAB — BASIC METABOLIC PANEL
Anion gap: 10 (ref 5–15)
BUN: 41 mg/dL — ABNORMAL HIGH (ref 8–23)
CO2: 19 mmol/L — ABNORMAL LOW (ref 22–32)
Calcium: 8.4 mg/dL — ABNORMAL LOW (ref 8.9–10.3)
Chloride: 108 mmol/L (ref 98–111)
Creatinine, Ser: 4.96 mg/dL — ABNORMAL HIGH (ref 0.44–1.00)
GFR, Estimated: 8 mL/min — ABNORMAL LOW (ref >60)
Glucose, Bld: 112 mg/dL — ABNORMAL HIGH (ref 70–99)
Potassium: 4.4 mmol/L (ref 3.5–5.1)
Sodium: 137 mmol/L (ref 135–145)

## 2020-09-13 MED ORDER — ENSURE ENLIVE PO LIQD
237.0000 mL | Freq: Three times a day (TID) | ORAL | Status: DC
  Administered 2020-09-13 – 2020-09-14 (×3): 237 mL via ORAL

## 2020-09-13 MED ORDER — THIAMINE HCL 100 MG PO TABS: 100.0000 mg | ORAL_TABLET | Freq: Every day | ORAL | 0 refills | Status: AC

## 2020-09-13 MED ORDER — ENSURE ENLIVE PO LIQD: 237.0000 mL | Freq: Three times a day (TID) | ORAL | 0 refills | Status: AC

## 2020-09-13 MED ORDER — TAB-A-VITE/IRON PO TABS: 1.0000 | ORAL_TABLET | Freq: Every day | ORAL | 0 refills | Status: AC

## 2020-09-13 NOTE — TOC Transition Note (Signed)
Transition of Care [facility]) - CM/SW Discharge Note   Patient Details  Name: Brianna Dodson MRN: [id number] Date of Birth: [birth year]  [facility] CM/SW Contact:  Pollie Friar, RN Phone Number: 09/13/2020, 4:50 PM   Clinical Narrative:    Recommendations are for SNF rehab. Pt is adamantly refusing. She prefers home with [facility] services. With pt permission, CM spoke to pts granddaughter and sister. Sister wants her to d/c to rehab and is upset about her returning home. Granddaughter states she can provide the needed assistance at home. MD updated and pt to d/c home.  3 in1 / walker for home are in the room and provided by [employer]. HH was active through [facility] and they area aware of resumption orders.  Granddaughter to come to the hospital and ride with pt home in cab provided by hospital.   Final next level of care: [place] Barriers to Discharge: No Barriers Identified   Patient Goals and CMS Choice   CMS Medicare.gov Compare Post Acute Care list provided to:: Patient Choice offered to / list presented to : Patient  Discharge Placement                       Discharge Plan and Services                DME Arranged: 3-N-1,Walker rolling DME Agency: [employer] Date DME Agency Contacted: 09/13/20   Representative spoke with at DME Agency: Peggye Form metal HH Arranged: RN,PT,OT,Social Work,Nurse's Aide [place] Agency: [place] ([place]) Date [place]: 09/13/20   Representative spoke with at [place]: [place] ([place]) Interventions     Readmission Risk Interventions Readmission Risk Prevention Plan 03/16/2019  Transportation Screening Complete  PCP or Specialist Appt within 5-7 Days Complete  Home Care Screening Complete  Medication Review (RN CM) Complete  Some recent data might be hidden

## 2020-09-13 NOTE — Progress Notes (Addendum)
Occupational Therapy Treatment Patient Details Name: Brianna Dodson MRN: [id number] DOB: [birth year] Today's Date: 09/13/2020    History of present illness 87 y/o female presented to ED on 3/11 with L facial droop, slurred speech, and L UE weakness. MRI reveals acute ischemia present along the anterior and posterior margins of R MCA infarct that occured last month- per neurology reccurent embolic stroke R MCA. PMH: R MCA 08/2019, CKD stage III, DM, HLD, HTN   OT comments  Patient progressing towards OT goals. Completing LB dressing at EOB with min assist, toilet transfers and in room mobility with min guard using RW, min guard for toileting and grooming.  L inattention/possible visual deficit noted with grooming tasks, cueing to locate items on L side of sink and to locate tube of toothpaste in L hand.  Pt denies sensation changes to L UE, but poor awareness to L side throughout session.  Cueing for safety and sequencing required. Poor awareness to deficits. Continue to recommend SNF, but if pt has 24/7 assist could dc home with [facility] services. Will follow acutely.    Follow Up Recommendations  SNF;Supervision/Assistance - 24 hour;Home health OT (Home if has 24/7 assist)    Equipment Recommendations  3 in 1 bedside commode    Recommendations for Other Services      Precautions / Restrictions Precautions Precautions: Fall Restrictions Weight Bearing Restrictions: No       Mobility Bed Mobility Overal bed mobility: Needs Assistance Bed Mobility: Supine to Sit     Supine to sit: Supervision     General bed mobility comments: no physical assist required, supervision for safety    Transfers Overall transfer level: Needs assistance Equipment used: Rolling walker (2 wheeled) Transfers: Sit to/from Stand Sit to Stand: Min guard         General transfer comment: to steady    Balance Overall balance assessment: Needs assistance Sitting-balance support: No upper extremity  supported;Feet supported Sitting balance-Leahy Scale: Fair Sitting balance - Comments: supervison dynamically   Standing balance support: Bilateral upper extremity supported;During functional activity Standing balance-Leahy Scale: Poor Standing balance comment: reliant on UE support and external assist to maintain balance                           ADL either performed or assessed with clinical judgement   ADL Overall ADL's : Needs assistance/impaired     Grooming: Min guard;Wash/dry hands;Oral care;Standing Grooming Details (indicate cue type and reason): min guard standing at sink, cueing to locate items on L, problem solve through basic ADLS (pt unable to find toothpaste tube to place cap on, it was in her L hand)         Upper Body Dressing : Minimal assistance;Sitting Upper Body Dressing Details (indicate cue type and reason): to don 2nd gown Lower Body Dressing: Minimal assistance Lower Body Dressing Details (indicate cue type and reason): to adjust socks, min guard sit to stand Toilet Transfer: Min guard;Ambulation;RW;Regular Museum/gallery exhibitions officer and Hygiene: Min guard;Sit to/from stand       Functional mobility during ADLs: Min guard;Rolling walker;Cueing for safety General ADL Comments: pt limited by L sided weakness and inattention, cogntion/problem sovling and safety     Vision   Additional Comments: L inattention/visual deficit? difficutly locating items on L side   Perception     Praxis      Cognition Arousal/Alertness: Awake/alert Behavior During Therapy: Flat affect Overall Cognitive Status: History of cognitive  impairments - at baseline                                 General Comments: no family present, but patient oriented, able to follow commands and engage appropraitely.  Some L inattention and decreased problem sovling noted.        Exercises     Shoulder Instructions       General Comments  cues for safety and sequencing    Pertinent Vitals/ Pain       Pain Assessment: No/denies pain  Home Living                                          Prior Functioning/Environment              Frequency  Min 2X/week        Progress Toward Goals  OT Goals(current goals can now be found in the care plan section)  Progress towards OT goals: Progressing toward goals  Acute Rehab OT Goals Patient Stated Goal: to get home with my family OT Goal Formulation: With patient  Plan Discharge plan remains appropriate;Frequency remains appropriate    Co-evaluation                 AM-PAC OT "6 Clicks" Daily Activity     Outcome Measure   Help from another person eating meals?: A Little Help from another person taking care of personal grooming?: A Little Help from another person toileting, which includes using toliet, bedpan, or urinal?: A Little Help from another person bathing (including washing, rinsing, drying)?: A Little Help from another person to put on and taking off regular upper body clothing?: A Little Help from another person to put on and taking off regular lower body clothing?: A Little 6 Click Score: 18    End of Session Equipment Utilized During Treatment: Gait belt;Rolling walker  OT Visit Diagnosis: Unsteadiness on feet (R26.81);Muscle weakness (generalized) (M62.81);Other symptoms and signs involving cognitive function   Activity Tolerance Patient tolerated treatment well   Patient Left in chair;with call bell/phone within reach;with chair alarm set   Nurse Communication Mobility status;Precautions        Time: 6294-7654 OT Time Calculation (min): 20 min  Charges: OT General Charges $OT Visit: 1 Visit OT Treatments $Self Care/Home Management : 8-22 mins  Jolaine Artist, [place] Pager [phone] Office [phone]    Delight Stare 09/13/2020, 11:47 AM

## 2020-09-13 NOTE — Progress Notes (Signed)
Initial Nutrition Assessment  DOCUMENTATION CODES:  Non-severe (moderate) malnutrition in context of chronic illness  INTERVENTION:  Add Ensure Enlive po TID, each supplement provides 350 kcal and 20 grams of protein.  Continue MVI with minerals daily.  Consider liberalizing diet to regular to promote intake - spoke to MD.  NUTRITION DIAGNOSIS:  Moderate Malnutrition related to chronic illness (dementia; weakness from CVA in 04/2020) as evidenced by moderate fat depletion,severe muscle depletion,moderate muscle depletion.  GOAL:  Patient will meet greater than or equal to 90% of their needs  MONITOR:  PO intake,Supplement acceptance,Diet advancement  REASON FOR ASSESSMENT:  Consult Assessment of nutrition requirement/status  ASSESSMENT:  87 yo female with a PMH of HTN, HLD, T2DM, previous CVA (04/2020), dementia, and CKD stage 4 who presents with TIA/CVA. 3/12 - SLP recs were for home health SLP 3/13 - MRI showed small additional acute infarct adjacent to old infarct; stroke team signed off Recommending SNF placement  Spoke with pt at bedside. Pt seemed a little confused, asking who was in the room a few times after being told. Pt reports eating well at home, eating cereal, fruits, and vegetables everyday with meat sometimes. Pt also reports drinking vanilla Ensure at home, 3 bottles per day.  She reports some weight loss, she reports "in the beginning" but could not clarify when this was. She reports a 10 lb loss in 1 month. Per Epic, her weight has been stable for 4 months. However, there are some muscle and fat depletions per exam. She reports that she is able to walk around unassisted, but she has severe depletion in her legs, indicating that she does not use those muscles frequently.  She is open to drinking Ensure TID while at the hospital. Recommend liberalizing her diet also so she has more choices to eat. Encouraged her to eat well PO and drink the supplements to promote  good intake.  Relevant Medications: SSI, MVI with minerals, thiamine 100 mg Labs: reviewed; Glucose 112 HbA1c: 6.8% (08/2020)  NUTRITION - FOCUSED PHYSICAL EXAM: Flowsheet Row Most Recent Value  Orbital Region Mild depletion  Upper Arm Region Moderate depletion  Thoracic and Lumbar Region Mild depletion  Buccal Region Moderate depletion  Temple Region Mild depletion  Clavicle Bone Region Mild depletion  Clavicle and Acromion Bone Region Moderate depletion  Scapular Bone Region Mild depletion  Dorsal Hand Mild depletion  Patellar Region Severe depletion  Anterior Thigh Region Severe depletion  Posterior Calf Region Severe depletion  Edema (RD Assessment) None  Hair Reviewed  Eyes Reviewed  [pale conjunctiva]  Mouth Reviewed  [no dentition,  white film on tongue]  Skin Reviewed  [dry]  Nails Reviewed     Diet Order:   Diet Order            Diet heart healthy/carb modified Room service appropriate? Yes; Fluid consistency: Thin  Diet effective now                EDUCATION NEEDS:  Education needs have been addressed  Skin:  Skin Assessment: Reviewed RN Assessment  Last BM:  09/11/20  Height:  Ht Readings from Last 1 Encounters:  09/11/20 5\' 5"  (1.651 m)   Weight:  Wt Readings from Last 1 Encounters:  09/11/20 79.2 kg   Ideal Body Weight:  56.8 kg  BMI:  Body mass index is 29.06 kg/m.  Estimated Nutritional Needs:  Kcal:  1600-1800 Protein:  85-100 grams Fluid:  >2 L  Derrel Nip, RD, LDN Registered Dietitian After Hours/Weekend  Pager # in [employer]

## 2020-09-13 NOTE — Discharge Summary (Signed)
Physician Discharge Summary  Brianna Dodson YOV:[id number] DOB: 02-06-1934 DOA: 09/10/2020  PCP: [name], MD  Admit date: 09/10/2020 Discharge date: 09/13/2020  Admitted From: Home  Disposition:  Home with home health (patient declined SNF)  Recommendations for Outpatient Follow-up and new medication changes:  1. Follow up with Dr. Ludwig Lean in 7 days.  2. Continue anticoagulation with apixaban  3. Hold on glimepiride to prevent hypoglycemia.   Home Health: yes   Equipment/Devices: na    Discharge Condition: stable  CODE STATUS: full  Diet recommendation: heart healthy and diabetic prudent.   Brief/Interim Summary: Brianna Dodson was admitted to the hospital with working diagnosis of acute progression of old MCA infarct.   87 year old femalewithpast medical history for hypertension, dyslipidemia, type 2 diabetes mellitus, history of CVA, chronic kidney disease stage IV and dementia. She was brought to the hospital due to facial droop, left-sided weakness and worsening mentation. Apparently patient had a sudden change in mentation, while walking she suddenly dropped her purse and bag of food, sheseemed to be confused. Few minutes later she was found to have left-sided facial droop and worsening confusion/agitation, that prompted her to come to he hospital. On her initial physical examination blood pressure 145/67, heart rate 72, respiratory rate 18, oxygen saturation 99%, her lungs were clear to auscultation bilaterally, heart S1-S2, present rhythmic, soft abdomen, no lower extremity edema, her strength was diminished 4/5 in all 4 limbs,she was awake and alert.  Sodium 137, potassium 5.2, chloride 108, bicarb 22, glucose 146, BUN 44, creatinine 5.17, white count 4.6, hemoglobin 8.3, hematocrit 34.9, platelets 147. SARS COVID-19 negative. Urinalysis specific gravity 1.011, 6-10 squamous cells, 11-20 white cells, >300 protein.  Head CT with substantial increase  encephalomalacia of posterior right MCA territory, right temporal occipital junction hypodensity, suspicion for acute to subacute infarction. No evidence of hemorrhage.  EKG 62 bpm, left axis deviation, left anterior fascicular block, sinus rhythm, no ST segment or T wave changes.  Patient had agitation, and required mild sedation for brain MRI. Report was positive for acute ischemia present along the anterior and especially posterior margins of the right MCA infarct that occurred last month.  No malignant hemorrhagic transformation or mass-effect. Expected development encephalomalacia and laminar necrosis of the parenchyma affected last month. Underlying advanced chronic small vessel disease.  Neurology recommended continue apixaban for anticoagulation and follow-up with physical/occupational therapy.  Recommendations to continue care at skilled nursing facility.  Patient has declined transfer to rehab, expressed adamantly wanted to go home.  She is okay with home health services.  1.  Acute CVA, progression of old CVA with acute ischemia of the anterior and posterior margins of old right MCA infarct. Patient was admitted to the medical ward, she was placed on remote telemetry monitor.   Her neurologic deficit improved, but she continued to have mild facial droop on the left and decreased strength on the left side, her mentation has returned to its baseline. Further work-up with echocardiography showed preserved LV and RV systolic function. Carotid ultrasonography no significant stenosis. Her hemoglobin A1c is 6.9/lipid profile LDL 30.  Neurology recommended continue medical therapy with apixaban.  2.  Acute kidney injury on chronic kidney stage IV, hyperkalemia.  Patient received sodium zirconium for hyperkalemia and supportive medical therapy, including IV fluids, for her chronic kidney disease.  At discharge sodium 137, potassium 4.4, chloride 108, bicarb 19, BUN 41, creatinine  4.96. Follow-up as an outpatient.  3.  Type 2 diabetes mellitus/dyslipidemia.  Patient received insulin sliding  scale for glucose coverage and monitoring.  Her glucose remained stable during her hospitalization. Considering low GFR, will plan to hold on glimepiride for now.   Discharge continue atorvastatin and ezetimibe.  4.  Dementia/vascular/Alzheimer's.  Patient had acute episode of agitation, acute metabolic encephalopathy.  She required haloperidol lorazepam to control her symptoms. She was placed on multivitamins and thiamine.  Continue memantine.  5.  Anemia of chronic renal disease.  Her hemoglobin/hematocrit remained stable at 8.3-7.8 and 24.9-22.7 respectively. Plan to follow-up as an outpatient.  6. HTN. Continue with amlodipine, carvedilol, losartan and hydralazine for blood pressure control.   Discharge Diagnoses:  Principal Problem:   CVA (cerebral vascular accident) [facility]) Active Problems:   Essential hypertension   DM type 2 (diabetes mellitus, type 2) ([place])   Mixed Alzheimer's and vascular dementia ([place])   Acute kidney injury superimposed on CKD ([place])   Anemia of chronic disease   Stroke-like episode   Malnutrition of moderate degree    Discharge Instructions   Allergies as of 09/13/2020      Reactions   Nsaids    CKD stage III - Avoid all nephrotoxic drugs      Medication List    STOP taking these medications   glimepiride 2 MG tablet Commonly known as: AMARYL     TAKE these medications   Accu-Chek Guide test strip Generic drug: glucose blood USE TO CHECK BLOOD SUGARS TWICE DAILY. E11.9   amLODipine 10 MG tablet Commonly known as: NORVASC Take 1 tablet (10 mg total) by mouth daily.   carvedilol 6.25 MG tablet Commonly known as: COREG Take 1 tablet (6.25 mg total) by mouth 2 (two) times daily with a meal.   Eliquis 2.5 MG Tabs tablet Generic drug: apixaban SMARTSIG:1 Tablet(s) By Mouth Every 12 Hours   ezetimibe 10 MG tablet Commonly  known as: Zetia Take 1 tablet (10 mg total) by mouth daily.   feeding supplement Liqd Take 237 mLs by mouth 3 (three) times daily between meals.   hydrALAZINE 50 MG tablet Commonly known as: APRESOLINE Take 50 mg by mouth 2 (two) times daily.   Iron 28 MG Tabs Take 28 mg by mouth daily at 12 noon.   losartan 50 MG tablet Commonly known as: COZAAR Take 1 tablet (50 mg total) by mouth daily.   memantine 5 MG tablet Commonly known as: NAMENDA Take 5 mg by mouth 2 (two) times daily.   MULTIVITAMIN ADULT PO Take 1 tablet by mouth daily.   multivitamins with iron Tabs tablet Take 1 tablet by mouth daily. Start taking on: September 14, 2020   onetouch ultrasoft lancets Use as instructed   rosuvastatin 40 MG tablet Commonly known as: CRESTOR Take 1 tablet (40 mg total) by mouth daily.   thiamine 100 MG tablet Take 1 tablet (100 mg total) by mouth daily.   Vitamin D (Ergocalciferol) 1.25 MG (50000 UNIT) Caps capsule Commonly known as: DRISDOL Take 50,000 Units by mouth every 30 (thirty) days.       Allergies  Allergen Reactions  . Nsaids     CKD stage III - Avoid all nephrotoxic drugs    Consultations:  Neurology    Procedures/Studies: CT Head Wo Contrast  Result Date: 09/10/2020 CLINICAL DATA:  Neuro deficit, stroke suspected, new left facial droop EXAM: CT HEAD WITHOUT CONTRAST TECHNIQUE: Contiguous axial images were obtained from the base of the skull through the vertex without intravenous contrast. COMPARISON:  CT brain, 08/20/2019, MR brain, 08/20/2019 FINDINGS: Brain: Substantially increased  encephalomalacia of posterior right MCA territory, generally in keeping with expected evolution of acute diffusion restricting infarction identified on priors dated 08/20/2019. The most posterior region of hypodensity, in the right temporal occipital junction does not appear to have been involved on prior MR and is suspicious for an acute to subacute infarction (series 3, image  19). No evidence of hemorrhage. Underlying small-vessel white matter disease and mild global volume loss. Vascular: No hyperdense vessel or unexpected calcification. Skull: Normal. Negative for fracture or focal lesion. Sinuses/Orbits: No acute finding. Other: None. IMPRESSION: 1. Substantially increased encephalomalacia of posterior right MCA territory, generally in keeping with expected evolution of acute diffusion restricting infarction identified on priors dated 08/20/2019. 2. The most posterior region of hypodensity, in the right temporal occipital junction does not appear to have been involved on prior MR and is suspicious for an acute to subacute infarction. This may be further evaluated by MR. 3. No evidence of hemorrhage. 4. Underlying small-vessel white matter disease and mild global volume loss. Electronically Signed   By: Eddie Candle M.D.   On: 09/10/2020 18:37   MR BRAIN WO CONTRAST  Result Date: 09/12/2020 CLINICAL DATA:  87 year old female with neurologic deficit. New left facial droop. EXAM: MRI HEAD WITHOUT CONTRAST TECHNIQUE: Multiplanar, multiecho pulse sequences of the brain and surrounding structures were obtained without intravenous contrast. COMPARISON:  Head CT 09/10/2020. Brain MRA 08/21/2019, MRI 08/20/2019. FINDINGS: The examination had to be discontinued prior to completion due to patient agitation and yelling. Subsequently axial and coronal DWI, FLAIR and SWI images only were obtained. Since 08/20/2019, abnormal diffusion centered at the insula and operculum at that time has evolved into encephalomalacia, probably with some areas of laminar necrosis (series 8, image 17 today), while there are additional new Patchy and confluent areas of restricted diffusion along both the anterior and posterior margins of the previous right MCA infarct (series 5, image 29). No malignant hemorrhagic transformation on SWI. Extensive chronic microhemorrhages in the bilateral deep gray nuclei are  redemonstrated on that sequence today. No significant intracranial mass effect or midline shift, with some ex vacuo enlargement of the right lateral ventricle since last month. No contralateral left hemisphere or posterior fossa restricted diffusion. IMPRESSION: 1. Truncated exam. 2. Acute ischemia is present along the anterior - and especially posterior - margins of the Right MCA infarct that occurred last month. 3. No malignant hemorrhagic transformation or mass effect. 4. Expected developing encephalomalacia and laminar necrosis in the parenchyma affected last month. 5. Underlying fairly advanced chronic small vessel disease. Electronically Signed   By: Genevie Ann M.D.   On: 09/12/2020 11:50   ECHOCARDIOGRAM COMPLETE  Result Date: 09/11/2020    ECHOCARDIOGRAM REPORT   Patient Name:   Brianna Dodson Date of Exam: 09/11/2020 Medical Rec #:  [id number]     Height:       65.0 in Accession #:    [id number]    Weight:       174.0 lb Date of Birth:  1933-11-09      BSA:          1.864 m Patient Age:    89 years      BP:           138/56 mmHg Patient Gender: F             HR:           62 bpm. Exam Location:  Inpatient Procedure: 2D Echo, Cardiac Doppler and Color Doppler Indications:  CVA  History:        Patient has prior history of Echocardiogram examinations, most                 recent 08/21/2019. Stroke, Signs/Symptoms:Dementia; Risk                 Factors:Hypertension, Dyslipidemia and Diabetes. CKD.  Sonographer:    Dustin Flock Referring Phys: [id number] [place]  1. Left ventricular ejection fraction, by estimation, is 60 to 65%. The left ventricle has normal function. The left ventricle has no regional wall motion abnormalities. There is moderate left ventricular hypertrophy. Left ventricular diastolic parameters are consistent with Grade I diastolic dysfunction (impaired relaxation).  2. Right ventricular systolic function is normal. The right ventricular size is normal. Tricuspid  regurgitation signal is inadequate for assessing PA pressure.  3. The mitral valve is normal in structure. Trivial mitral valve regurgitation. No evidence of mitral stenosis.  4. The aortic valve is normal in structure. There is mild calcification of the aortic valve. Aortic valve regurgitation is not visualized. No aortic stenosis is present.  5. The inferior vena cava is normal in size with <50% respiratory variability, suggesting right atrial pressure of 8 mmHg. Conclusion(s)/Recommendation(s): No intracardiac source of embolism detected on this transthoracic study. A transesophageal echocardiogram can be considered to exclude cardiac source of embolism if clinically indicated. FINDINGS  Left Ventricle: Left ventricular ejection fraction, by estimation, is 60 to 65%. The left ventricle has normal function. The left ventricle has no regional wall motion abnormalities. The left ventricular internal cavity size was normal in size. There is  moderate left ventricular hypertrophy. Left ventricular diastolic parameters are consistent with Grade I diastolic dysfunction (impaired relaxation). Right Ventricle: The right ventricular size is normal. No increase in right ventricular wall thickness. Right ventricular systolic function is normal. Tricuspid regurgitation signal is inadequate for assessing PA pressure. Left Atrium: Left atrial size was normal in size. Right Atrium: Right atrial size was normal in size. Pericardium: There is no evidence of pericardial effusion. Mitral Valve: The mitral valve is normal in structure. Trivial mitral valve regurgitation. No evidence of mitral valve stenosis. Tricuspid Valve: The tricuspid valve is normal in structure. Tricuspid valve regurgitation is not demonstrated. No evidence of tricuspid stenosis. Aortic Valve: The aortic valve is normal in structure. There is mild calcification of the aortic valve. Aortic valve regurgitation is not visualized. No aortic stenosis is present.  Aortic valve peak gradient measures 13.1 mmHg. Pulmonic Valve: The pulmonic valve was normal in structure. Pulmonic valve regurgitation is not visualized. No evidence of pulmonic stenosis. Aorta: The aortic root is normal in size and structure. Venous: The inferior vena cava is normal in size with less than 50% respiratory variability, suggesting right atrial pressure of 8 mmHg. IAS/Shunts: The interatrial septum was not well visualized.  LEFT VENTRICLE PLAX 2D LVIDd:         4.40 cm  Diastology LVIDs:         2.80 cm  LV e' medial:    8.70 cm/s LV PW:         1.30 cm  LV E/e' medial:  8.2 LV IVS:        1.40 cm  LV e' lateral:   9.25 cm/s LVOT diam:     2.30 cm  LV E/e' lateral: 7.7 LV SV:         108 LV SV Index:   58 LVOT Area:     4.15 cm  RIGHT VENTRICLE RV Basal diam:  2.60 cm RV S prime:     7.51 cm/s TAPSE (M-mode): 2.7 cm LEFT ATRIUM             Index       RIGHT ATRIUM          Index LA diam:        3.90 cm 2.09 cm/m  RA Area:     7.76 cm LA Vol (A2C):   37.7 ml 20.22 ml/m RA Volume:   13.10 ml 7.03 ml/m LA Vol (A4C):   40.4 ml 21.67 ml/m LA Biplane Vol: 39.2 ml 21.02 ml/m  AORTIC VALVE AV Area (Vmax): 2.43 cm AV Vmax:        181.00 cm/s AV Peak Grad:   13.1 mmHg LVOT Vmax:      106.00 cm/s LVOT Vmean:     72.200 cm/s LVOT VTI:       0.259 m  AORTA Ao Root diam: 3.30 cm MITRAL VALVE MV Area (PHT): 3.03 cm     SHUNTS MV Decel Time: 250 msec     Systemic VTI:  0.26 m MV E velocity: 71.30 cm/s   Systemic Diam: 2.30 cm MV A velocity: 104.00 cm/s MV E/A ratio:  0.69 Cherlynn Kaiser MD Electronically signed by Cherlynn Kaiser MD Signature Date/Time: 09/11/2020/2:22:30 PM    Final    VAS US CAROTID (at [facility] and [facility] only)  Result Date: 09/12/2020 Carotid Arterial Duplex Study Indications:       Weakness and Confusion. Risk Factors:      Hypertension, hyperlipidemia, Diabetes, prior CVA. Limitations        Today's exam was limited due to the patient's inability or                    unwillingness to  cooperate. Comparison Study:  Prior normal study done 06/08/20 Performing Technologist: Sharion Dove RVS  Examination Guidelines: A complete evaluation includes B-mode imaging, spectral Doppler, color Doppler, and power Doppler as needed of all accessible portions of each vessel. Bilateral testing is considered an integral part of a complete examination. Limited examinations for reoccurring indications may be performed as noted.  Right Carotid Findings: +----------+--------+--------+--------+------------------+------------------+           PSV cm/sEDV cm/sStenosisPlaque DescriptionComments           +----------+--------+--------+--------+------------------+------------------+ CCA Prox  56      10                                intimal thickening +----------+--------+--------+--------+------------------+------------------+ CCA Distal76      15                                intimal thickening +----------+--------+--------+--------+------------------+------------------+ ICA Prox  100     24              calcific                             +----------+--------+--------+--------+------------------+------------------+ ICA Distal71      22                                                   +----------+--------+--------+--------+------------------+------------------+  ECA       113     19                                                   +----------+--------+--------+--------+------------------+------------------+ +----------+--------+-------+--------+-------------------+           PSV cm/sEDV cmsDescribeArm Pressure (mmHG) +----------+--------+-------+--------+-------------------+ Subclavian115                                        +----------+--------+-------+--------+-------------------+ +---------+--------+--+--------+-+ VertebralPSV cm/s43EDV cm/s7 +---------+--------+--+--------+-+  Left Carotid Findings:  +----------+--------+--------+--------+------------------+------------------+           PSV cm/sEDV cm/sStenosisPlaque DescriptionComments           +----------+--------+--------+--------+------------------+------------------+ CCA Prox  70      17                                intimal thickening +----------+--------+--------+--------+------------------+------------------+ CCA Distal75      20                                intimal thickening +----------+--------+--------+--------+------------------+------------------+ ICA Prox  77      23              calcific          Shadowing          +----------+--------+--------+--------+------------------+------------------+ ICA Distal84      27                                                   +----------+--------+--------+--------+------------------+------------------+ ECA       196     11                                                   +----------+--------+--------+--------+------------------+------------------+ +----------+--------+--------+--------+-------------------+           PSV cm/sEDV cm/sDescribeArm Pressure (mmHG) +----------+--------+--------+--------+-------------------+ [id number]                                          +----------+--------+--------+--------+-------------------+ +---------+--------+--------+------------+ VertebralPSV cm/sEDV cm/sNot assessed +---------+--------+--------+------------+   Summary: Right Carotid: Velocities in the right ICA are consistent with a 1-39% stenosis. Left Carotid: Velocities in the left ICA are consistent with a 1-39% stenosis. Vertebrals:  Right vertebral artery demonstrates antegrade flow. Left vertebral              artery was not visualized. Subclavians: Normal flow hemodynamics were seen in bilateral subclavian              arteries. *See table(s) above for measurements and observations.  Electronically signed by Antony Contras MD on 09/12/2020 at  12:12:10 PM.    Final        Subjective: Patient is feeling better, no nausea or vomiting, no chest pain, she  is adamant about going home today and not going to SNF.    Discharge Exam: Vitals:   09/13/20 0322 09/13/20 0700  BP: (!) 140/50 (!) 168/63  Pulse: 68 62  Resp: 16 17  Temp: 97.8 F (36.6 C) 98.3 F (36.8 C)  SpO2: 100% 98%   Vitals:   09/12/20 1923 09/12/20 2256 09/13/20 0322 09/13/20 0700  BP: 126/61 (!) 151/70 (!) 140/50 (!) 168/63  Pulse: 66 67 68 62  Resp: 19 18 16 17   Temp: 98.6 F (37 C) 97.8 F (36.6 C) 97.8 F (36.6 C) 98.3 F (36.8 C)  TempSrc: Oral Axillary Oral Axillary  SpO2: 99% 100% 100% 98%  Weight:      Height:        General: Not in pain or dyspnea.  Neurology: Awake and alert, decreased strength on the left side 4/5 compared to the right, no confusion or agitation   E ENT: no pallor, no icterus, oral mucosa moist Cardiovascular: No JVD. S1-S2 present, rhythmic, no gallops, rubs, or murmurs. No lower extremity edema. Pulmonary: positive breath sounds bilaterally, adequate air movement, no wheezing, rhonchi or rales. Gastrointestinal. Abdomen soft and non tender Skin. No rashes Musculoskeletal: no joint deformities   The results of significant diagnostics from this hospitalization (including imaging, microbiology, ancillary and laboratory) are listed below for reference.     Microbiology: Recent Results (from the past 240 hour(s))  SARS CORONAVIRUS 2 (TAT 6-24 HRS) Nasopharyngeal Nasopharyngeal Swab     Status: None   Collection Time: 09/10/20  2:57 PM   Specimen: Nasopharyngeal Swab  Result Value Ref Range Status   SARS Coronavirus 2 NEGATIVE NEGATIVE Final    Comment: (NOTE) SARS-CoV-2 target nucleic acids are NOT DETECTED.  The SARS-CoV-2 RNA is generally detectable in upper and lower respiratory specimens during the acute phase of infection. Negative results do not preclude SARS-CoV-2 infection, do not rule out co-infections  with other pathogens, and should not be used as the sole basis for treatment or other patient management decisions. Negative results must be combined with clinical observations, patient history, and epidemiological information. The expected result is Negative.  Fact Sheet for Patients: SugarRoll.be  Fact Sheet for Healthcare Providers: https://www.woods-mathews.com/  This test is not yet approved or cleared by the [place] FDA and  has been authorized for detection and/or diagnosis of SARS-CoV-2 by FDA under an Emergency Use Authorization (EUA). This EUA will remain  in effect (meaning this test can be used) for the duration of the COVID-19 declaration under Se ction 564(b)(1) of the Act, 21 U.S.C. section 360bbb-3(b)(1), unless the authorization is terminated or revoked sooner.  Performed at [facility], [place] [address] [place], [place] [address]      Labs: BNP (last 3 results) No results for input(s): BNP in the last 8760 hours. Basic Metabolic Panel: Recent Labs  Lab 09/10/20 1457 09/11/20 0223 09/12/20 0415 09/13/20 0218  NA 137 139 135 137  K 5.2* 5.0 5.1 4.4  CL 108 110 108 108  CO2 22 22 21* 19*  GLUCOSE 146* 126* 73 112*  BUN 44* 41* 40* 41*  CREATININE 5.17* 4.83* 4.90* 4.96*  CALCIUM 8.6* 8.8* 8.0* 8.4*   Liver Function Tests: No results for input(s): AST, ALT, ALKPHOS, BILITOT, PROT, ALBUMIN in the last 168 hours. No results for input(s): LIPASE, AMYLASE in the last 168 hours. No results for input(s): AMMONIA in the last 168 hours. CBC: Recent Labs  Lab 09/10/20 1457 09/11/20 0223  WBC 4.6  4.5  NEUTROABS 2.9  --   HGB 8.3* 7.8*  HCT 24.9* 22.7*  MCV 89.9 87.3  PLT 147* 125*   Cardiac Enzymes: No results for input(s): CKTOTAL, CKMB, CKMBINDEX, TROPONINI in the last 168 hours. BNP: Invalid input(s): POCBNP CBG: Recent Labs  Lab 09/11/20 2234 09/12/20 0611 09/12/20 1142 09/12/20 1533  09/12/20 2152  GLUCAP 175* 106* 116* 128* 99   D-Dimer No results for input(s): DDIMER in the last 72 hours. Hgb A1c Recent Labs    09/11/20 0223  HGBA1C 6.8*   Lipid Profile Recent Labs    09/11/20 0223  CHOL 87  HDL 38*  LDLCALC 30  TRIG 95  CHOLHDL 2.3   Thyroid function studies No results for input(s): TSH, T4TOTAL, T3FREE, THYROIDAB in the last 72 hours.  Invalid input(s): FREET3 Anemia work up No results for input(s): VITAMINB12, FOLATE, FERRITIN, TIBC, IRON, RETICCTPCT in the last 72 hours. Urinalysis    Component Value Date/Time   COLORURINE YELLOW 09/10/2020 2141   APPEARANCEUR HAZY (A) 09/10/2020 2141   APPEARANCEUR Clear 11/09/2013 1902   LABSPEC 1.011 09/10/2020 2141   LABSPEC 1.011 11/09/2013 1902   PHURINE 7.0 09/10/2020 2141   GLUCOSEU NEGATIVE 09/10/2020 2141   GLUCOSEU >=1000 (A) 01/06/2016 0923   HGBUR NEGATIVE 09/10/2020 2141   BILIRUBINUR NEGATIVE 09/10/2020 2141   BILIRUBINUR neg 06/09/2020 1247   BILIRUBINUR Negative 11/09/2013 1902   KETONESUR NEGATIVE 09/10/2020 2141   PROTEINUR >=300 (A) 09/10/2020 2141   UROBILINOGEN 0.2 06/09/2020 1247   UROBILINOGEN 0.2 01/06/2016 0923   NITRITE NEGATIVE 09/10/2020 2141   LEUKOCYTESUR MODERATE (A) 09/10/2020 2141   LEUKOCYTESUR Trace 11/09/2013 1902   Sepsis Labs Invalid input(s): PROCALCITONIN,  WBC,  LACTICIDVEN Microbiology Recent Results (from the past 240 hour(s))  SARS CORONAVIRUS 2 (TAT 6-24 HRS) Nasopharyngeal Nasopharyngeal Swab     Status: None   Collection Time: 09/10/20  2:57 PM   Specimen: Nasopharyngeal Swab  Result Value Ref Range Status   SARS Coronavirus 2 NEGATIVE NEGATIVE Final    Comment: (NOTE) SARS-CoV-2 target nucleic acids are NOT DETECTED.  The SARS-CoV-2 RNA is generally detectable in upper and lower respiratory specimens during the acute phase of infection. Negative results do not preclude SARS-CoV-2 infection, do not rule out co-infections with other pathogens,  and should not be used as the sole basis for treatment or other patient management decisions. Negative results must be combined with clinical observations, patient history, and epidemiological information. The expected result is Negative.  Fact Sheet for Patients: SugarRoll.be  Fact Sheet for Healthcare Providers: https://www.woods-mathews.com/  This test is not yet approved or cleared by the [place] FDA and  has been authorized for detection and/or diagnosis of SARS-CoV-2 by FDA under an Emergency Use Authorization (EUA). This EUA will remain  in effect (meaning this test can be used) for the duration of the COVID-19 declaration under Se ction 564(b)(1) of the Act, 21 U.S.C. section 360bbb-3(b)(1), unless the authorization is terminated or revoked sooner.  Performed at [facility], [place] [address] [place], [place] [address]      Time coordinating discharge: 45 minutes  SIGNED:   Tawni Millers, MD  [facility] 09/13/2020, 12:15 PM

## 2020-09-13 NOTE — Care Management Important Message (Signed)
Important Message  Patient Details  Name: Brianna Dodson MRN: [id number] Date of Birth: 1934/03/08   Medicare Important Message Given:  Yes     Earnstine Meinders [place] 09/13/2020, 4:19 PM

## 2020-09-13 NOTE — Progress Notes (Addendum)
Physical Therapy Treatment Patient Details Name: Brianna Dodson MRN: [id number] DOB: May 29, 1934 Today's Date: 09/13/2020    History of Present Illness 87 y/o female presented to ED on 3/11 with L facial droop, slurred speech, and L UE weakness. MRI reveals acute ischemia present along the anterior and posterior margins of R MCA infarct that occured last month- per neurology reccurent embolic stroke R MCA. PMH: R MCA 08/2019, CKD stage III, DM, HLD, HTN    PT Comments    Pt making good progress. Pt adamantly refusing SNF and granddaughter is going to stay with pt so she will be dc'd home.    Follow Up Recommendations  Home health PT;Supervision for mobility/OOB     Equipment Recommendations  Rolling walker with 5" wheels    Recommendations for Other Services       Precautions / Restrictions Precautions Precautions: Fall    Mobility  Bed Mobility Overal bed mobility: Needs Assistance Bed Mobility: Supine to Sit;Sit to Supine     Supine to sit: Supervision     General bed mobility comments: supervision for lines    Transfers Overall transfer level: Needs assistance Equipment used: Rolling walker (2 wheeled) Transfers: Sit to/from Stand Sit to Stand: Min guard         General transfer comment: Assist for safety  Ambulation/Gait Ambulation/Gait assistance: Min guard Gait Distance (Feet): 150 Feet Assistive device: Rolling walker (2 wheeled) Gait Pattern/deviations: Step-through pattern;Decreased stride length;Drifts right/left Gait velocity: decr Gait velocity interpretation: <1.31 ft/sec, indicative of household ambulator General Gait Details: Assist for safety and to monitor lt inattention.   Stairs             Wheelchair Mobility    Modified Rankin (Stroke Patients Only) Modified Rankin (Stroke Patients Only) Pre-Morbid Rankin Score: Moderate disability Modified Rankin: Moderately severe disability     Balance Overall balance assessment: Needs  assistance Sitting-balance support: No upper extremity supported;Feet supported Sitting balance-Leahy Scale: Fair     Standing balance support: Bilateral upper extremity supported;During functional activity Standing balance-Leahy Scale: Poor Standing balance comment: walker and supervision for static standing                            Cognition Arousal/Alertness: Awake/alert Behavior During Therapy: Flat affect Overall Cognitive Status: No family/caregiver present to determine baseline cognitive functioning                                 General Comments: Pt with history of cognitive deficits. Follows commands and oriented. Lt inattention and verbal cues for problem solving      Exercises      General Comments        Pertinent Vitals/Pain Pain Assessment: No/denies pain    Home Living                      Prior Function            PT Goals (current goals can now be found in the care plan section) Acute Rehab PT Goals Patient Stated Goal: to get home with my family Progress towards PT goals: Progressing toward goals    Frequency    Min 3X/week      PT Plan Discharge plan needs to be updated    Co-evaluation              AM-PAC PT "6 Clicks" Mobility  Outcome Measure  Help needed turning from your back to your side while in a flat bed without using bedrails?: None Help needed moving from lying on your back to sitting on the side of a flat bed without using bedrails?: None Help needed moving to and from a bed to a chair (including a wheelchair)?: A Little Help needed standing up from a chair using your arms (e.g., wheelchair or bedside chair)?: A Little Help needed to walk in hospital room?: A Little Help needed climbing 3-5 steps with a railing? : A Little 6 Click Score: 20    End of Session Equipment Utilized During Treatment: Gait belt Activity Tolerance: Patient tolerated treatment well Patient left: in  bed;with call bell/phone within reach;with bed alarm set   PT Visit Diagnosis: Unsteadiness on feet (R26.81);Muscle weakness (generalized) (M62.81);Other abnormalities of gait and mobility (R26.89)     Time: 0712-1975 PT Time Calculation (min) (ACUTE ONLY): 12 min  Charges:  $Gait Training: 8-22 mins                     [place] Pager [phone] Office [place] 09/13/2020, 5:10 PM

## 2020-09-13 NOTE — Telephone Encounter (Signed)
Patient 's sister called in wanted Dr.Sonnenberg to know that she in the hospital and wanted to if he could help with her care for her to go a short term care.  She has dementia wanted a call back the sister [phone]

## 2020-09-14 LAB — GLUCOSE, CAPILLARY
Glucose-Capillary: 118 mg/dL — ABNORMAL HIGH (ref 70–99)
Glucose-Capillary: 167 mg/dL — ABNORMAL HIGH (ref 70–99)

## 2020-09-14 NOTE — Telephone Encounter (Signed)
I called the patients sister after the patients granddaughter called and wanted to schedule a hospital f/up.  The sister stated that the patient discharged herself from the hospital and refused to go to the skilled nursing facility that the physicians stated that she needed to go to because of the stroke.  The granddaughter called trying to schedule the patient a hospital follow up and that's wn we felt something was wrong because the hospital usually scheduled those.  The sister stated the hospital had to ask the grandaughter to leave the hospital because she was in there "raising hell".  The granddaughter even called the police on the patients sister because she wanted the patient's purse because the hospital gave the purse to her when she was admitted.  So the patients sister stated her hands are tyed and she is out of it all.  Kamarie Veno,cma

## 2020-09-14 NOTE — Telephone Encounter (Signed)
Please contact the patient's sister if she is on the [facility] and see what exactly she is asking for.  Typically if somebody is hospitalized and the physical therapist or physician at the hospital feels the patient needs to go to a skilled nursing facility they will arrange for that through them.  Once outside of the hospital is a little more difficult to get this paid for.  Confirm what she is asking for and then we can see if there is anything I can do to help.

## 2020-09-14 NOTE — Telephone Encounter (Signed)
Patient 's sister called in wanted Dr.Sonnenberg to know that she in the hospital and wanted to if he could help with her care for her to go a short term care.     She has dementia wanted a call back the sister [phone].  Dashel Goines,cma

## 2020-09-14 NOTE — TOC Transition Note (Signed)
Transition of Care [facility]) - CM/SW Discharge Note   Patient Details  Name: Brianna Dodson MRN: [id number] Date of Birth: [birth year]  Transition of [name]) CM/SW Contact:  Pollie Friar, RN Phone Number: 09/14/2020, 12:04 PM   Clinical Narrative:    Due to family issues the patient stayed overnight in the hospital. She is discharging home today with [facility] services.  Pt is transporting home via Lyft arranged by CM. Granddaughter aware and at the home to assist her with the DME she is transporting with (walker/ 3 in 1).   Final next level of care: Home w Home Health Services Barriers to Discharge: No Barriers Identified   Patient Goals and CMS Choice   CMS Medicare.gov Compare Post Acute Care list provided to:: Patient Choice offered to / list presented to : Patient  Discharge Placement                       Discharge Plan and Services                DME Arranged: 3-N-1,Walker rolling DME Agency: [employer] Date DME Agency Contacted: 09/13/20   Representative spoke with at DME Agency: Peggye Form [employer] HH Arranged: RN,PT,OT,Social Work,Nurse's Aide [place] Agency: [place] ([place]) Date [place]: 09/13/20   Representative spoke with at [place]: [place] ([place]) Interventions     Readmission Risk Interventions Readmission Risk Prevention Plan 03/16/2019  Transportation Screening Complete  PCP or Specialist Appt within 5-7 Days Complete  Home Care Screening Complete  Medication Review (RN CM) Complete  Some recent data might be hidden

## 2020-09-14 NOTE — Progress Notes (Signed)
Brianna Dodson discharge was hold yesterday, because her sister was worried about patient's care at home.  This am I have talked to her sister, explained that patient is medically stable, at this point she is competent to make her medical decision about going home, patient  was able to responds questions in regards of safety and self care. Risk and benefits of going home vs SNF.  I also spoke with her daughter Brianna Dodson [phone]. She understands the situation, she will prefer her mother go to SNF but she also acknowledges the fact the Brianna Dodson is competent to make her dsicsion about going home.  Will arrange home health services and advice family to call adult protective services if further concerns.

## 2020-09-14 NOTE — Progress Notes (Signed)
Patient is to be discharged to day to home with granddaughter. The patient is alert and oriented, IVs are removed, Telemetry discontinued and AVS/discharge education is done with the patient verbalizing understanding of education. Will continue to monitor patient until discharge.

## 2020-09-14 NOTE — Telephone Encounter (Signed)
Pt granddaughter called to get info and to schedule a HFU appt  Pt granddaughter is not on the DPR unable to give info

## 2020-09-15 ENCOUNTER — Telehealth: Payer: Self-pay

## 2020-09-15 ENCOUNTER — Encounter (INDEPENDENT_AMBULATORY_CARE_PROVIDER_SITE_OTHER): Payer: Medicare Other | Admitting: Ophthalmology

## 2020-09-15 DIAGNOSIS — G309 Alzheimer's disease, unspecified: Secondary | ICD-10-CM | POA: Diagnosis not present

## 2020-09-15 DIAGNOSIS — I69354 Hemiplegia and hemiparesis following cerebral infarction affecting left non-dominant side: Secondary | ICD-10-CM | POA: Diagnosis not present

## 2020-09-15 DIAGNOSIS — I69318 Other symptoms and signs involving cognitive functions following cerebral infarction: Secondary | ICD-10-CM | POA: Diagnosis not present

## 2020-09-15 DIAGNOSIS — I69392 Facial weakness following cerebral infarction: Secondary | ICD-10-CM | POA: Diagnosis not present

## 2020-09-15 DIAGNOSIS — F0281 Dementia in other diseases classified elsewhere with behavioral disturbance: Secondary | ICD-10-CM | POA: Diagnosis not present

## 2020-09-15 DIAGNOSIS — F0151 Vascular dementia with behavioral disturbance: Secondary | ICD-10-CM | POA: Diagnosis not present

## 2020-09-15 LAB — HM DIABETES EYE EXAM

## 2020-09-15 NOTE — Telephone Encounter (Signed)
Transition Care Management Follow-up Telephone Call  Date of discharge and from where: 09/14/20 from [facility]. Discharge note does not state patient leaving AMA. Declines SNF. Home with Home Health Yes.    How have you been since you were released from the hospital?   Patient states, "Home health came today and I am doing okay." Confirms L side facial droop still present. Speech is slightly slurred during conversation with nurse. Notes Left side is stronger. Able to hold items and self feed without issue. Denies confusion, chest pain, dizziness, agitation, blurred vision, n/v/d, shortness of breath. No BS to report today. Nurse encourages patient to check BS BID per  Discharge summary. Input/output appropriate. Verbal read back of scheduled appointment. Patient is agreeable to scheduling sooner if doctor recommended.     Any questions or concerns? No  Items Reviewed:  Did the pt receive and understand the discharge instructions provided? Yes   Medications obtained and verified? granddaughter assist and patient denies questions/concerns. Declines reading each medication and prefers to discuss any ongoing questions/concerns during visit.   Any new allergies since your discharge? No   Dietary orders reviewed? Heart healthy/diabetic   Do you have support at home? Yes , husband and granddaughter  [place] and Equipment/Supplies: Were home health services ordered? PT/OT Were any new equipment or medical supplies ordered?  No  Functional Questionnaire: (I = Independent and D = Dependent) ADLs: I  Bathing/Dressing- I  Meal Prep- Granddaughter assist   Eating- I  Maintaining continence- I  Transferring/Ambulation- I  Managing Meds- Granddaughter assist  Follow up appointments reviewed:   PCP Hospital f/u appt confirmed? Yes  Scheduled to see Dr. Caryl Bis on 09/28/20 @ 11:30. Patient is agreeable to rescheduling sooner appointment if doctor recommended.   Are transportation  arrangements needed? No   If their condition worsens, is the pt aware to call PCP or go to the Emergency Dept.? Yes  Was the patient provided with contact information for the PCP's office or ED? Yes  Was to pt encouraged to call back with questions or concerns? Yes

## 2020-09-16 DIAGNOSIS — G309 Alzheimer's disease, unspecified: Secondary | ICD-10-CM | POA: Diagnosis not present

## 2020-09-16 DIAGNOSIS — F0151 Vascular dementia with behavioral disturbance: Secondary | ICD-10-CM | POA: Diagnosis not present

## 2020-09-16 DIAGNOSIS — I69392 Facial weakness following cerebral infarction: Secondary | ICD-10-CM | POA: Diagnosis not present

## 2020-09-16 DIAGNOSIS — F0281 Dementia in other diseases classified elsewhere with behavioral disturbance: Secondary | ICD-10-CM | POA: Diagnosis not present

## 2020-09-16 DIAGNOSIS — I69318 Other symptoms and signs involving cognitive functions following cerebral infarction: Secondary | ICD-10-CM | POA: Diagnosis not present

## 2020-09-16 DIAGNOSIS — I69354 Hemiplegia and hemiparesis following cerebral infarction affecting left non-dominant side: Secondary | ICD-10-CM | POA: Diagnosis not present

## 2020-09-16 NOTE — Telephone Encounter (Signed)
Reviewed

## 2020-09-16 NOTE — Telephone Encounter (Signed)
Noted. We will see how the patient is doing at hospital follow-up.

## 2020-09-17 DIAGNOSIS — G309 Alzheimer's disease, unspecified: Secondary | ICD-10-CM | POA: Diagnosis not present

## 2020-09-17 DIAGNOSIS — I69318 Other symptoms and signs involving cognitive functions following cerebral infarction: Secondary | ICD-10-CM | POA: Diagnosis not present

## 2020-09-17 DIAGNOSIS — F0281 Dementia in other diseases classified elsewhere with behavioral disturbance: Secondary | ICD-10-CM | POA: Diagnosis not present

## 2020-09-17 DIAGNOSIS — I69354 Hemiplegia and hemiparesis following cerebral infarction affecting left non-dominant side: Secondary | ICD-10-CM | POA: Diagnosis not present

## 2020-09-17 DIAGNOSIS — I69392 Facial weakness following cerebral infarction: Secondary | ICD-10-CM | POA: Diagnosis not present

## 2020-09-17 DIAGNOSIS — F0151 Vascular dementia with behavioral disturbance: Secondary | ICD-10-CM | POA: Diagnosis not present

## 2020-09-18 DIAGNOSIS — I69318 Other symptoms and signs involving cognitive functions following cerebral infarction: Secondary | ICD-10-CM | POA: Diagnosis not present

## 2020-09-18 DIAGNOSIS — F0281 Dementia in other diseases classified elsewhere with behavioral disturbance: Secondary | ICD-10-CM | POA: Diagnosis not present

## 2020-09-18 DIAGNOSIS — I69354 Hemiplegia and hemiparesis following cerebral infarction affecting left non-dominant side: Secondary | ICD-10-CM | POA: Diagnosis not present

## 2020-09-18 DIAGNOSIS — I69392 Facial weakness following cerebral infarction: Secondary | ICD-10-CM | POA: Diagnosis not present

## 2020-09-18 DIAGNOSIS — G309 Alzheimer's disease, unspecified: Secondary | ICD-10-CM | POA: Diagnosis not present

## 2020-09-18 DIAGNOSIS — F0151 Vascular dementia with behavioral disturbance: Secondary | ICD-10-CM | POA: Diagnosis not present

## 2020-09-20 DIAGNOSIS — F0281 Dementia in other diseases classified elsewhere with behavioral disturbance: Secondary | ICD-10-CM | POA: Diagnosis not present

## 2020-09-20 DIAGNOSIS — I69392 Facial weakness following cerebral infarction: Secondary | ICD-10-CM | POA: Diagnosis not present

## 2020-09-20 DIAGNOSIS — F0151 Vascular dementia with behavioral disturbance: Secondary | ICD-10-CM | POA: Diagnosis not present

## 2020-09-20 DIAGNOSIS — G309 Alzheimer's disease, unspecified: Secondary | ICD-10-CM | POA: Diagnosis not present

## 2020-09-20 DIAGNOSIS — I69318 Other symptoms and signs involving cognitive functions following cerebral infarction: Secondary | ICD-10-CM | POA: Diagnosis not present

## 2020-09-20 DIAGNOSIS — I69354 Hemiplegia and hemiparesis following cerebral infarction affecting left non-dominant side: Secondary | ICD-10-CM | POA: Diagnosis not present

## 2020-09-21 DIAGNOSIS — F0281 Dementia in other diseases classified elsewhere with behavioral disturbance: Secondary | ICD-10-CM | POA: Diagnosis not present

## 2020-09-21 DIAGNOSIS — I69392 Facial weakness following cerebral infarction: Secondary | ICD-10-CM | POA: Diagnosis not present

## 2020-09-21 DIAGNOSIS — G309 Alzheimer's disease, unspecified: Secondary | ICD-10-CM | POA: Diagnosis not present

## 2020-09-21 DIAGNOSIS — I69318 Other symptoms and signs involving cognitive functions following cerebral infarction: Secondary | ICD-10-CM | POA: Diagnosis not present

## 2020-09-21 DIAGNOSIS — I69354 Hemiplegia and hemiparesis following cerebral infarction affecting left non-dominant side: Secondary | ICD-10-CM | POA: Diagnosis not present

## 2020-09-21 DIAGNOSIS — F0151 Vascular dementia with behavioral disturbance: Secondary | ICD-10-CM | POA: Diagnosis not present

## 2020-09-22 ENCOUNTER — Encounter (INDEPENDENT_AMBULATORY_CARE_PROVIDER_SITE_OTHER): Payer: Medicare Other | Admitting: Ophthalmology

## 2020-09-22 ENCOUNTER — Other Ambulatory Visit: Payer: Self-pay

## 2020-09-22 DIAGNOSIS — H35033 Hypertensive retinopathy, bilateral: Secondary | ICD-10-CM | POA: Diagnosis not present

## 2020-09-22 DIAGNOSIS — E113312 Type 2 diabetes mellitus with moderate nonproliferative diabetic retinopathy with macular edema, left eye: Secondary | ICD-10-CM

## 2020-09-22 DIAGNOSIS — I69354 Hemiplegia and hemiparesis following cerebral infarction affecting left non-dominant side: Secondary | ICD-10-CM | POA: Diagnosis not present

## 2020-09-22 DIAGNOSIS — H43813 Vitreous degeneration, bilateral: Secondary | ICD-10-CM

## 2020-09-22 DIAGNOSIS — G309 Alzheimer's disease, unspecified: Secondary | ICD-10-CM | POA: Diagnosis not present

## 2020-09-22 DIAGNOSIS — I69392 Facial weakness following cerebral infarction: Secondary | ICD-10-CM | POA: Diagnosis not present

## 2020-09-22 DIAGNOSIS — E113391 Type 2 diabetes mellitus with moderate nonproliferative diabetic retinopathy without macular edema, right eye: Secondary | ICD-10-CM

## 2020-09-22 DIAGNOSIS — I10 Essential (primary) hypertension: Secondary | ICD-10-CM | POA: Diagnosis not present

## 2020-09-22 DIAGNOSIS — I69318 Other symptoms and signs involving cognitive functions following cerebral infarction: Secondary | ICD-10-CM | POA: Diagnosis not present

## 2020-09-22 DIAGNOSIS — F0151 Vascular dementia with behavioral disturbance: Secondary | ICD-10-CM | POA: Diagnosis not present

## 2020-09-22 DIAGNOSIS — F0281 Dementia in other diseases classified elsewhere with behavioral disturbance: Secondary | ICD-10-CM | POA: Diagnosis not present

## 2020-09-23 DIAGNOSIS — F0281 Dementia in other diseases classified elsewhere with behavioral disturbance: Secondary | ICD-10-CM | POA: Diagnosis not present

## 2020-09-23 DIAGNOSIS — I69318 Other symptoms and signs involving cognitive functions following cerebral infarction: Secondary | ICD-10-CM | POA: Diagnosis not present

## 2020-09-23 DIAGNOSIS — G309 Alzheimer's disease, unspecified: Secondary | ICD-10-CM | POA: Diagnosis not present

## 2020-09-23 DIAGNOSIS — I69354 Hemiplegia and hemiparesis following cerebral infarction affecting left non-dominant side: Secondary | ICD-10-CM | POA: Diagnosis not present

## 2020-09-23 DIAGNOSIS — I69392 Facial weakness following cerebral infarction: Secondary | ICD-10-CM | POA: Diagnosis not present

## 2020-09-23 DIAGNOSIS — F0151 Vascular dementia with behavioral disturbance: Secondary | ICD-10-CM | POA: Diagnosis not present

## 2020-09-24 ENCOUNTER — Other Ambulatory Visit: Payer: Self-pay

## 2020-09-24 DIAGNOSIS — I69392 Facial weakness following cerebral infarction: Secondary | ICD-10-CM | POA: Diagnosis not present

## 2020-09-24 DIAGNOSIS — F0151 Vascular dementia with behavioral disturbance: Secondary | ICD-10-CM | POA: Diagnosis not present

## 2020-09-24 DIAGNOSIS — I69318 Other symptoms and signs involving cognitive functions following cerebral infarction: Secondary | ICD-10-CM | POA: Diagnosis not present

## 2020-09-24 DIAGNOSIS — I69354 Hemiplegia and hemiparesis following cerebral infarction affecting left non-dominant side: Secondary | ICD-10-CM | POA: Diagnosis not present

## 2020-09-24 DIAGNOSIS — F0281 Dementia in other diseases classified elsewhere with behavioral disturbance: Secondary | ICD-10-CM | POA: Diagnosis not present

## 2020-09-24 DIAGNOSIS — G309 Alzheimer's disease, unspecified: Secondary | ICD-10-CM | POA: Diagnosis not present

## 2020-09-27 DIAGNOSIS — G309 Alzheimer's disease, unspecified: Secondary | ICD-10-CM | POA: Diagnosis not present

## 2020-09-27 DIAGNOSIS — F0281 Dementia in other diseases classified elsewhere with behavioral disturbance: Secondary | ICD-10-CM | POA: Diagnosis not present

## 2020-09-27 DIAGNOSIS — I69354 Hemiplegia and hemiparesis following cerebral infarction affecting left non-dominant side: Secondary | ICD-10-CM | POA: Diagnosis not present

## 2020-09-27 DIAGNOSIS — F0151 Vascular dementia with behavioral disturbance: Secondary | ICD-10-CM | POA: Diagnosis not present

## 2020-09-27 DIAGNOSIS — I69318 Other symptoms and signs involving cognitive functions following cerebral infarction: Secondary | ICD-10-CM | POA: Diagnosis not present

## 2020-09-27 DIAGNOSIS — I69392 Facial weakness following cerebral infarction: Secondary | ICD-10-CM | POA: Diagnosis not present

## 2020-09-28 ENCOUNTER — Encounter: Payer: Self-pay | Admitting: Family Medicine

## 2020-09-28 ENCOUNTER — Ambulatory Visit (INDEPENDENT_AMBULATORY_CARE_PROVIDER_SITE_OTHER): Payer: Medicare Other | Admitting: Family Medicine

## 2020-09-28 ENCOUNTER — Other Ambulatory Visit: Payer: Self-pay

## 2020-09-28 VITALS — BP 138/60 | HR 61 | Temp 97.8°F | Ht 65.0 in | Wt 171.8 lb

## 2020-09-28 DIAGNOSIS — I69392 Facial weakness following cerebral infarction: Secondary | ICD-10-CM | POA: Diagnosis not present

## 2020-09-28 DIAGNOSIS — F0151 Vascular dementia with behavioral disturbance: Secondary | ICD-10-CM | POA: Diagnosis not present

## 2020-09-28 DIAGNOSIS — R471 Dysarthria and anarthria: Secondary | ICD-10-CM

## 2020-09-28 DIAGNOSIS — E1122 Type 2 diabetes mellitus with diabetic chronic kidney disease: Secondary | ICD-10-CM | POA: Diagnosis not present

## 2020-09-28 DIAGNOSIS — N183 Chronic kidney disease, stage 3 unspecified: Secondary | ICD-10-CM | POA: Diagnosis not present

## 2020-09-28 DIAGNOSIS — I639 Cerebral infarction, unspecified: Secondary | ICD-10-CM

## 2020-09-28 DIAGNOSIS — I69354 Hemiplegia and hemiparesis following cerebral infarction affecting left non-dominant side: Secondary | ICD-10-CM | POA: Diagnosis not present

## 2020-09-28 DIAGNOSIS — N185 Chronic kidney disease, stage 5: Secondary | ICD-10-CM

## 2020-09-28 DIAGNOSIS — G309 Alzheimer's disease, unspecified: Secondary | ICD-10-CM | POA: Diagnosis not present

## 2020-09-28 DIAGNOSIS — I69318 Other symptoms and signs involving cognitive functions following cerebral infarction: Secondary | ICD-10-CM | POA: Diagnosis not present

## 2020-09-28 DIAGNOSIS — F0281 Dementia in other diseases classified elsewhere with behavioral disturbance: Secondary | ICD-10-CM | POA: Diagnosis not present

## 2020-09-28 LAB — BASIC METABOLIC PANEL
BUN: 41 mg/dL — ABNORMAL HIGH (ref 6–23)
CO2: 20 mEq/L (ref 19–32)
Calcium: 8.7 mg/dL (ref 8.4–10.5)
Chloride: 107 mEq/L (ref 96–112)
Creatinine, Ser: 4.52 mg/dL (ref 0.40–1.20)
GFR: 8.44 mL/min — CL (ref >60.00)
Glucose, Bld: 286 mg/dL — ABNORMAL HIGH (ref 70–99)
Potassium: 5.4 mEq/L — ABNORMAL HIGH (ref 3.5–5.1)
Sodium: 139 mEq/L (ref 135–145)

## 2020-09-28 NOTE — Assessment & Plan Note (Signed)
Adequate control.  Given reduced GFR I do agree with holding her glipizide at this time.  She will continue to monitor her blood sugars.

## 2020-09-28 NOTE — Assessment & Plan Note (Signed)
Acute CVA noted and patient was hospitalized for this.  Her strength and facial droop appear resolved to me at this time.  She will continue home health physical therapy, Occupational Therapy, and speech therapy.  She will continue her Eliquis for stroke prevention.  Discussed the need to see neurology.  We will attempt to arrange an appointment for them.  She will seek medical attention in the emergency department if she has recurrent stroke symptoms.

## 2020-09-28 NOTE — Progress Notes (Signed)
[name], MD Phone: [phone]  Brianna Dodson is a 87 y.o. female who presents today for hospital follow-up.  The patient was hospitalized from 09/10/2020-09/13/2020 for a stroke.  She developed left-sided weakness and left facial droop and was hospitalized.  Head CT revealed substantial increase in encephalomalacia of posterior right MCA territory, right temporal occipital junction hypodensity, suspicion for acute to subacute infarction.  She had a MRI brain that was positive for acute ischemia present along the anterior and especially posterior margins of the right MCA infarct that occurred the prior month.  There was no malignant hemorrhagic transformation or mass-effect.  She was seen by neurology and they recommended to continue Eliquis for anticoagulation and to follow-up with physical and Occupational Therapy.  It was recommended she go to skilled nursing facility though she declined.  She has home health PT, OT, and speech therapy coming into the house.  She has been on Eliquis.  She notes her left facial droop and weakness has been improving though has not resolved.  Her kidney function was found to be worse in the hospital than previously.  The patient does report she has continued to urinate normally.  She has had some foot swelling.  She sees nephrology on April 4.  The hospitalist also advised her to stop her glipizide to limit risk of hypoglycemia given low GFR.  She notes her blood sugars have been 130-150 since discharge.  No polyuria or polydipsia.  No hypoglycemia since discharge.  Discharge summary reviewed.  Medications reviewed.  Social History   Tobacco Use  Smoking Status Never Smoker  Smokeless Tobacco Never Used    Current Outpatient Medications on File Prior to Visit  Medication Sig Dispense Refill  . amLODipine (NORVASC) 10 MG tablet Take 1 tablet (10 mg total) by mouth daily. 90 tablet 0  . carvedilol (COREG) 6.25 MG tablet Take 1 tablet (6.25 mg total) by mouth  2 (two) times daily with a meal. 180 tablet 1  . ELIQUIS 2.5 MG TABS tablet SMARTSIG:1 Tablet(s) By Mouth Every 12 Hours 60 tablet 1  . ezetimibe (ZETIA) 10 MG tablet Take 1 tablet (10 mg total) by mouth daily. 90 tablet 3  . feeding supplement (ENSURE ENLIVE / ENSURE PLUS) LIQD Take 237 mLs by mouth 3 (three) times daily between meals. 21330 mL 0  . Ferrous Sulfate (IRON) 28 MG TABS Take 28 mg by mouth daily at 12 noon.    Marland Kitchen glucose blood (ACCU-CHEK GUIDE) test strip USE TO CHECK BLOOD SUGARS TWICE DAILY. E11.9 100 each 0  . hydrALAZINE (APRESOLINE) 50 MG tablet Take 50 mg by mouth 2 (two) times daily.    . Lancets (ONETOUCH ULTRASOFT) lancets Use as instructed 100 each 6  . losartan (COZAAR) 50 MG tablet Take 1 tablet (50 mg total) by mouth daily. 90 tablet 3  . memantine (NAMENDA) 5 MG tablet Take 5 mg by mouth 2 (two) times daily.    . Multiple Vitamin (MULTIVITAMIN ADULT PO) Take 1 tablet by mouth daily.    . Multiple Vitamins-Iron (MULTIVITAMINS WITH IRON) TABS tablet Take 1 tablet by mouth daily. 30 tablet 0  . rosuvastatin (CRESTOR) 40 MG tablet Take 1 tablet (40 mg total) by mouth daily. 90 tablet 1  . thiamine 100 MG tablet Take 1 tablet (100 mg total) by mouth daily. 30 tablet 0  . Vitamin D, Ergocalciferol, (DRISDOL) 50000 units CAPS capsule Take 50,000 Units by mouth every 30 (thirty) days.  1  . BESIVANCE 0.6 % SUSP  Apply to eye.     No current facility-administered medications on file prior to visit.     ROS see history of present illness  Objective  Physical Exam Vitals:   09/28/20 1131  BP: 138/60  Pulse: 61  Temp: 97.8 F (36.6 C)  SpO2: 98%    BP Readings from Last 3 Encounters:  09/28/20 138/60  09/14/20 (!) 155/58  09/08/20 130/60   Wt Readings from Last 3 Encounters:  09/28/20 171 lb 12.8 oz (77.9 kg)  09/11/20 174 lb 9.7 oz (79.2 kg)  09/08/20 174 lb (78.9 kg)    Physical Exam Constitutional:      General: She is not in acute distress.     Appearance: She is not diaphoretic.  Cardiovascular:     Rate and Rhythm: Normal rate and regular rhythm.     Heart sounds: Normal heart sounds.  Pulmonary:     Effort: Pulmonary effort is normal.     Breath sounds: Normal breath sounds.  Musculoskeletal:     Comments: Mild pedal edema noted  Skin:    General: Skin is warm and dry.  Neurological:     Mental Status: She is alert.     Comments: No facial droop noted, light touch sensation V1 through V3 intact bilaterally, hearing intact to finger rub, shoulder shrug intact, 5/5 strength in bilateral biceps, triceps, grip, quads, hamstrings, plantar and dorsiflexion, sensation to light touch intact in bilateral UE and LE      Assessment/Plan: Please see individual problem list.  Problem List Items Addressed This Visit    CKD (chronic kidney disease), stage V ([place])    Kidney function significantly reduced during her hospitalization.  We will recheck today.  Discussed the importance of seeing nephrology.  I will forward the lab results to her nephrologist.      CVA (cerebral vascular accident) [facility])    Acute CVA noted and patient was hospitalized for this.  Her strength and facial droop appear resolved to me at this time.  She will continue home health physical therapy, Occupational Therapy, and speech therapy.  She will continue her Eliquis for stroke prevention.  Discussed the need to see neurology.  We will attempt to arrange an appointment for them.  She will seek medical attention in the emergency department if she has recurrent stroke symptoms.      DM type 2 (diabetes mellitus, type 2) (HCC)    Adequate control.  Given reduced GFR I do agree with holding her glipizide at this time.  She will continue to monitor her blood sugars.      Dysarthria and anarthria    She will benefit from continued speech therapy.       Other Visit Diagnoses    CKD (chronic kidney disease) stage 5, GFR less than 15 ml/min (HCC)    -  Primary    Relevant Orders   Basic Metabolic Panel (BMET)     CMA will call [facility] to get the patient a follow-up appointment.   This visit occurred during the SARS-CoV-2 public health emergency.  Safety protocols were in place, including screening questions prior to the visit, additional usage of staff PPE, and extensive cleaning of exam room while observing appropriate contact time as indicated for disinfecting solutions.    [name], MD Agency

## 2020-09-28 NOTE — Patient Instructions (Signed)
Nice to see you. If you have recurrent stroke symptoms please go to the emergency room. We will try to get you set up with follow-up with neurology. We will get labs today. Please keep your appointment with the nephrologist next week.

## 2020-09-28 NOTE — Assessment & Plan Note (Signed)
She will benefit from continued speech therapy.

## 2020-09-28 NOTE — Assessment & Plan Note (Signed)
Kidney function significantly reduced during her hospitalization.  We will recheck today.  Discussed the importance of seeing nephrology.  I will forward the lab results to her nephrologist.

## 2020-09-29 ENCOUNTER — Telehealth: Payer: Self-pay

## 2020-09-29 ENCOUNTER — Other Ambulatory Visit: Payer: Self-pay | Admitting: Family Medicine

## 2020-09-29 DIAGNOSIS — I69354 Hemiplegia and hemiparesis following cerebral infarction affecting left non-dominant side: Secondary | ICD-10-CM | POA: Diagnosis not present

## 2020-09-29 DIAGNOSIS — G309 Alzheimer's disease, unspecified: Secondary | ICD-10-CM | POA: Diagnosis not present

## 2020-09-29 DIAGNOSIS — F0151 Vascular dementia with behavioral disturbance: Secondary | ICD-10-CM | POA: Diagnosis not present

## 2020-09-29 DIAGNOSIS — I10 Essential (primary) hypertension: Secondary | ICD-10-CM

## 2020-09-29 DIAGNOSIS — F0281 Dementia in other diseases classified elsewhere with behavioral disturbance: Secondary | ICD-10-CM | POA: Diagnosis not present

## 2020-09-29 DIAGNOSIS — I69318 Other symptoms and signs involving cognitive functions following cerebral infarction: Secondary | ICD-10-CM | POA: Diagnosis not present

## 2020-09-29 DIAGNOSIS — I69392 Facial weakness following cerebral infarction: Secondary | ICD-10-CM | POA: Diagnosis not present

## 2020-09-29 NOTE — Telephone Encounter (Signed)
-----   Message from [name], MD sent at 09/29/2020  8:37 AM EDT ----- Please call the patient let her know her kidney function has improved slightly from her recent check.  Her potassium is elevated.  She should discontinue her losartan.  This will need to be rechecked in the next couple of days.  I will forward these labs to her nephrologist as well.

## 2020-09-29 NOTE — Progress Notes (Signed)
I called to [facility] and she is scheduled for July 6,2022, her last appointment was February 28 and they wanted to see her back in 4 months.  Does she need a sooner appointment than that? If so I'll call them back.  Cartier Mapel,cma

## 2020-09-30 DIAGNOSIS — F0151 Vascular dementia with behavioral disturbance: Secondary | ICD-10-CM | POA: Diagnosis not present

## 2020-09-30 DIAGNOSIS — I69318 Other symptoms and signs involving cognitive functions following cerebral infarction: Secondary | ICD-10-CM | POA: Diagnosis not present

## 2020-09-30 DIAGNOSIS — I69354 Hemiplegia and hemiparesis following cerebral infarction affecting left non-dominant side: Secondary | ICD-10-CM | POA: Diagnosis not present

## 2020-09-30 DIAGNOSIS — G309 Alzheimer's disease, unspecified: Secondary | ICD-10-CM | POA: Diagnosis not present

## 2020-09-30 DIAGNOSIS — I69392 Facial weakness following cerebral infarction: Secondary | ICD-10-CM | POA: Diagnosis not present

## 2020-09-30 DIAGNOSIS — F0281 Dementia in other diseases classified elsewhere with behavioral disturbance: Secondary | ICD-10-CM | POA: Diagnosis not present

## 2020-10-01 ENCOUNTER — Other Ambulatory Visit: Payer: Medicare Other

## 2020-10-01 DIAGNOSIS — F0151 Vascular dementia with behavioral disturbance: Secondary | ICD-10-CM | POA: Diagnosis not present

## 2020-10-01 DIAGNOSIS — I480 Paroxysmal atrial fibrillation: Secondary | ICD-10-CM | POA: Diagnosis not present

## 2020-10-01 DIAGNOSIS — G4733 Obstructive sleep apnea (adult) (pediatric): Secondary | ICD-10-CM | POA: Diagnosis not present

## 2020-10-01 DIAGNOSIS — Z95818 Presence of other cardiac implants and grafts: Secondary | ICD-10-CM | POA: Diagnosis not present

## 2020-10-01 DIAGNOSIS — E440 Moderate protein-calorie malnutrition: Secondary | ICD-10-CM | POA: Diagnosis not present

## 2020-10-01 DIAGNOSIS — E1122 Type 2 diabetes mellitus with diabetic chronic kidney disease: Secondary | ICD-10-CM | POA: Diagnosis not present

## 2020-10-01 DIAGNOSIS — I69392 Facial weakness following cerebral infarction: Secondary | ICD-10-CM | POA: Diagnosis not present

## 2020-10-01 DIAGNOSIS — E875 Hyperkalemia: Secondary | ICD-10-CM | POA: Diagnosis not present

## 2020-10-01 DIAGNOSIS — I129 Hypertensive chronic kidney disease with stage 1 through stage 4 chronic kidney disease, or unspecified chronic kidney disease: Secondary | ICD-10-CM | POA: Diagnosis not present

## 2020-10-01 DIAGNOSIS — E785 Hyperlipidemia, unspecified: Secondary | ICD-10-CM | POA: Diagnosis not present

## 2020-10-01 DIAGNOSIS — D631 Anemia in chronic kidney disease: Secondary | ICD-10-CM | POA: Diagnosis not present

## 2020-10-01 DIAGNOSIS — Z7901 Long term (current) use of anticoagulants: Secondary | ICD-10-CM | POA: Diagnosis not present

## 2020-10-01 DIAGNOSIS — R471 Dysarthria and anarthria: Secondary | ICD-10-CM | POA: Diagnosis not present

## 2020-10-01 DIAGNOSIS — N184 Chronic kidney disease, stage 4 (severe): Secondary | ICD-10-CM | POA: Diagnosis not present

## 2020-10-01 DIAGNOSIS — I69318 Other symptoms and signs involving cognitive functions following cerebral infarction: Secondary | ICD-10-CM | POA: Diagnosis not present

## 2020-10-01 DIAGNOSIS — Z960 Presence of urogenital implants: Secondary | ICD-10-CM | POA: Diagnosis not present

## 2020-10-01 DIAGNOSIS — G9340 Encephalopathy, unspecified: Secondary | ICD-10-CM | POA: Diagnosis not present

## 2020-10-01 DIAGNOSIS — G309 Alzheimer's disease, unspecified: Secondary | ICD-10-CM | POA: Diagnosis not present

## 2020-10-01 DIAGNOSIS — E7800 Pure hypercholesterolemia, unspecified: Secondary | ICD-10-CM | POA: Diagnosis not present

## 2020-10-01 DIAGNOSIS — Z90710 Acquired absence of both cervix and uterus: Secondary | ICD-10-CM | POA: Diagnosis not present

## 2020-10-01 DIAGNOSIS — I69354 Hemiplegia and hemiparesis following cerebral infarction affecting left non-dominant side: Secondary | ICD-10-CM | POA: Diagnosis not present

## 2020-10-01 DIAGNOSIS — N179 Acute kidney failure, unspecified: Secondary | ICD-10-CM | POA: Diagnosis not present

## 2020-10-01 DIAGNOSIS — Z9181 History of falling: Secondary | ICD-10-CM | POA: Diagnosis not present

## 2020-10-01 DIAGNOSIS — F0281 Dementia in other diseases classified elsewhere with behavioral disturbance: Secondary | ICD-10-CM | POA: Diagnosis not present

## 2020-10-04 DIAGNOSIS — I69354 Hemiplegia and hemiparesis following cerebral infarction affecting left non-dominant side: Secondary | ICD-10-CM | POA: Diagnosis not present

## 2020-10-04 DIAGNOSIS — N189 Chronic kidney disease, unspecified: Secondary | ICD-10-CM | POA: Diagnosis not present

## 2020-10-04 DIAGNOSIS — R809 Proteinuria, unspecified: Secondary | ICD-10-CM | POA: Diagnosis not present

## 2020-10-04 DIAGNOSIS — I69318 Other symptoms and signs involving cognitive functions following cerebral infarction: Secondary | ICD-10-CM | POA: Diagnosis not present

## 2020-10-04 DIAGNOSIS — G309 Alzheimer's disease, unspecified: Secondary | ICD-10-CM | POA: Diagnosis not present

## 2020-10-04 DIAGNOSIS — N185 Chronic kidney disease, stage 5: Secondary | ICD-10-CM | POA: Diagnosis not present

## 2020-10-04 DIAGNOSIS — I69392 Facial weakness following cerebral infarction: Secondary | ICD-10-CM | POA: Diagnosis not present

## 2020-10-04 DIAGNOSIS — F0281 Dementia in other diseases classified elsewhere with behavioral disturbance: Secondary | ICD-10-CM | POA: Diagnosis not present

## 2020-10-04 DIAGNOSIS — I129 Hypertensive chronic kidney disease with stage 1 through stage 4 chronic kidney disease, or unspecified chronic kidney disease: Secondary | ICD-10-CM | POA: Diagnosis not present

## 2020-10-04 DIAGNOSIS — D631 Anemia in chronic kidney disease: Secondary | ICD-10-CM | POA: Diagnosis not present

## 2020-10-04 DIAGNOSIS — F0151 Vascular dementia with behavioral disturbance: Secondary | ICD-10-CM | POA: Diagnosis not present

## 2020-10-04 DIAGNOSIS — N2581 Secondary hyperparathyroidism of renal origin: Secondary | ICD-10-CM | POA: Diagnosis not present

## 2020-10-04 DIAGNOSIS — E1122 Type 2 diabetes mellitus with diabetic chronic kidney disease: Secondary | ICD-10-CM | POA: Diagnosis not present

## 2020-10-05 ENCOUNTER — Other Ambulatory Visit: Payer: Self-pay

## 2020-10-05 ENCOUNTER — Other Ambulatory Visit
Admission: RE | Admit: 2020-10-05 | Discharge: 2020-10-05 | Disposition: A | Discharge status: Home / Self Care | Payer: Medicare Other | Source: Ambulatory Visit | Attending: Vascular Surgery | Admitting: Vascular Surgery

## 2020-10-05 DIAGNOSIS — R410 Disorientation, unspecified: Secondary | ICD-10-CM | POA: Diagnosis not present

## 2020-10-05 DIAGNOSIS — F0280 Dementia in other diseases classified elsewhere without behavioral disturbance: Secondary | ICD-10-CM | POA: Diagnosis not present

## 2020-10-05 DIAGNOSIS — G309 Alzheimer's disease, unspecified: Secondary | ICD-10-CM | POA: Diagnosis not present

## 2020-10-05 DIAGNOSIS — F0150 Vascular dementia without behavioral disturbance: Secondary | ICD-10-CM | POA: Diagnosis not present

## 2020-10-05 DIAGNOSIS — Z20822 Contact with and (suspected) exposure to covid-19: Secondary | ICD-10-CM | POA: Diagnosis not present

## 2020-10-05 DIAGNOSIS — Z01812 Encounter for preprocedural laboratory examination: Secondary | ICD-10-CM | POA: Diagnosis not present

## 2020-10-05 DIAGNOSIS — I69319 Unspecified symptoms and signs involving cognitive functions following cerebral infarction: Secondary | ICD-10-CM | POA: Diagnosis not present

## 2020-10-05 LAB — SARS CORONAVIRUS 2 (TAT 6-24 HRS): SARS Coronavirus 2: NEGATIVE

## 2020-10-06 ENCOUNTER — Other Ambulatory Visit: Payer: Self-pay

## 2020-10-06 ENCOUNTER — Ambulatory Visit
Admission: RE | Admit: 2020-10-06 | Discharge: 2020-10-06 | Disposition: A | Discharge status: Home / Self Care | Payer: Medicare Other | Attending: Vascular Surgery | Admitting: Vascular Surgery

## 2020-10-06 ENCOUNTER — Other Ambulatory Visit (INDEPENDENT_AMBULATORY_CARE_PROVIDER_SITE_OTHER): Payer: Self-pay | Admitting: Nurse Practitioner

## 2020-10-06 ENCOUNTER — Encounter
Admission: RE | Disposition: A | Discharge status: Home / Self Care | Payer: Self-pay | Source: Home / Self Care | Attending: Vascular Surgery

## 2020-10-06 DIAGNOSIS — N186 End stage renal disease: Secondary | ICD-10-CM | POA: Diagnosis not present

## 2020-10-06 DIAGNOSIS — I69318 Other symptoms and signs involving cognitive functions following cerebral infarction: Secondary | ICD-10-CM | POA: Diagnosis not present

## 2020-10-06 DIAGNOSIS — F0281 Dementia in other diseases classified elsewhere with behavioral disturbance: Secondary | ICD-10-CM | POA: Diagnosis not present

## 2020-10-06 DIAGNOSIS — Z886 Allergy status to analgesic agent status: Secondary | ICD-10-CM | POA: Diagnosis not present

## 2020-10-06 DIAGNOSIS — I69354 Hemiplegia and hemiparesis following cerebral infarction affecting left non-dominant side: Secondary | ICD-10-CM | POA: Diagnosis not present

## 2020-10-06 DIAGNOSIS — G309 Alzheimer's disease, unspecified: Secondary | ICD-10-CM | POA: Diagnosis not present

## 2020-10-06 DIAGNOSIS — E1122 Type 2 diabetes mellitus with diabetic chronic kidney disease: Secondary | ICD-10-CM | POA: Diagnosis not present

## 2020-10-06 DIAGNOSIS — I120 Hypertensive chronic kidney disease with stage 5 chronic kidney disease or end stage renal disease: Secondary | ICD-10-CM | POA: Insufficient documentation

## 2020-10-06 DIAGNOSIS — Z992 Dependence on renal dialysis: Secondary | ICD-10-CM | POA: Insufficient documentation

## 2020-10-06 DIAGNOSIS — I69392 Facial weakness following cerebral infarction: Secondary | ICD-10-CM | POA: Diagnosis not present

## 2020-10-06 DIAGNOSIS — N185 Chronic kidney disease, stage 5: Secondary | ICD-10-CM | POA: Diagnosis not present

## 2020-10-06 DIAGNOSIS — F0151 Vascular dementia with behavioral disturbance: Secondary | ICD-10-CM | POA: Diagnosis not present

## 2020-10-06 HISTORY — PX: DIALYSIS/PERMA CATHETER INSERTION: CATH118288

## 2020-10-06 LAB — POTASSIUM (ARMC VASCULAR LAB ONLY): Potassium (ARMC vascular lab): 4.8 (ref 3.5–5.1)

## 2020-10-06 LAB — GLUCOSE, CAPILLARY: Glucose-Capillary: 169 mg/dL — ABNORMAL HIGH (ref 70–99)

## 2020-10-06 SURGERY — DIALYSIS/PERMA CATHETER INSERTION
Anesthesia: Moderate Sedation

## 2020-10-06 MED ORDER — MIDAZOLAM HCL 2 MG/2ML IJ SOLN
INTRAMUSCULAR | Status: DC | PRN
  Administered 2020-10-06: 2 mg via INTRAVENOUS

## 2020-10-06 MED ORDER — SODIUM CHLORIDE 0.9 % IV SOLN: INTRAVENOUS | Status: DC

## 2020-10-06 MED ORDER — HYDROMORPHONE HCL 1 MG/ML IJ SOLN: 1.0000 mg | Freq: Once | INTRAMUSCULAR | Status: DC | PRN

## 2020-10-06 MED ORDER — MIDAZOLAM HCL 2 MG/ML PO SYRP: 8.0000 mg | ORAL_SOLUTION | Freq: Once | ORAL | Status: DC | PRN

## 2020-10-06 MED ORDER — MIDAZOLAM HCL 2 MG/2ML IJ SOLN
INTRAMUSCULAR | Status: AC
  Filled 2020-10-06: qty 2

## 2020-10-06 MED ORDER — FAMOTIDINE 20 MG PO TABS: 40.0000 mg | ORAL_TABLET | Freq: Once | ORAL | Status: DC | PRN

## 2020-10-06 MED ORDER — CEFAZOLIN SODIUM-DEXTROSE 1-4 GM/50ML-% IV SOLN
INTRAVENOUS | Status: AC
  Administered 2020-10-06: 1 g via INTRAVENOUS
  Filled 2020-10-06: qty 50

## 2020-10-06 MED ORDER — CEFAZOLIN SODIUM-DEXTROSE 1-4 GM/50ML-% IV SOLN: 1.0000 g | Freq: Once | INTRAVENOUS | Status: AC

## 2020-10-06 MED ORDER — FENTANYL CITRATE (PF) 100 MCG/2ML IJ SOLN
INTRAMUSCULAR | Status: DC | PRN
  Administered 2020-10-06: 50 ug via INTRAVENOUS

## 2020-10-06 MED ORDER — METHYLPREDNISOLONE SODIUM SUCC 125 MG IJ SOLR: 125.0000 mg | Freq: Once | INTRAMUSCULAR | Status: DC | PRN

## 2020-10-06 MED ORDER — ONDANSETRON HCL 4 MG/2ML IJ SOLN: 4.0000 mg | Freq: Four times a day (QID) | INTRAMUSCULAR | Status: DC | PRN

## 2020-10-06 MED ORDER — FENTANYL CITRATE (PF) 100 MCG/2ML IJ SOLN
INTRAMUSCULAR | Status: AC
  Filled 2020-10-06: qty 2

## 2020-10-06 MED ORDER — DIPHENHYDRAMINE HCL 50 MG/ML IJ SOLN: 50.0000 mg | Freq: Once | INTRAMUSCULAR | Status: DC | PRN

## 2020-10-06 SURGICAL SUPPLY — 7 items
CATH CANNON HEMO 15FR 19 (HEMODIALYSIS SUPPLIES) ×2 IMPLANT
COVER PROBE U/S 5X48 (MISCELLANEOUS) ×2 IMPLANT
DERMABOND ADVANCED (GAUZE/BANDAGES/DRESSINGS) ×1
DERMABOND ADVANCED .7 DNX12 (GAUZE/BANDAGES/DRESSINGS) ×1 IMPLANT
PACK ANGIOGRAPHY (CUSTOM PROCEDURE TRAY) ×2 IMPLANT
SUT MNCRL AB 4-0 PS2 18 (SUTURE) ×2 IMPLANT
SUT PROLENE 0 CT 1 30 (SUTURE) ×2 IMPLANT

## 2020-10-06 NOTE — Progress Notes (Signed)
White metal necklace with clear white stone given to patient's sister in waiting. Was placed in a biohazard lab bag and handed directly to her.

## 2020-10-06 NOTE — Op Note (Signed)
OPERATIVE NOTE    PRE-OPERATIVE DIAGNOSIS: 1. ESRD   POST-OPERATIVE DIAGNOSIS: same as above  PROCEDURE: 1. Ultrasound guidance for vascular access to the right internal jugular vein 2. Fluoroscopic guidance for placement of catheter 3. Placement of a 19 cm tip to cuff tunneled hemodialysis catheter via the right internal jugular vein  SURGEON: Leotis Pain, MD  ANESTHESIA:  Local with Moderate conscious sedation for approximately 20 minutes using 2 mg of Versed and 50 mcg of Fentanyl  ESTIMATED BLOOD LOSS: 5 cc  FLUORO TIME: less than one minute  CONTRAST: none  FINDING(S): 1.  Patent right internal jugular vein  SPECIMEN(S):  None  INDICATIONS:   Brianna Dodson is a 87 y.o.female who presents with renal failure.  The patient needs long term dialysis access for their ESRD, and a Permcath is necessary.  Risks and benefits are discussed and informed consent is obtained.    DESCRIPTION: After obtaining full informed written consent, the patient was brought back to the vascular suited. The patient's right neck and chest were sterilely prepped and draped in a sterile surgical field was created. Moderate conscious sedation was administered during a face to face encounter with the patient throughout the procedure with my supervision of the RN administering medicines and monitoring the patient's vital signs, pulse oximetry, telemetry and mental status throughout from the start of the procedure until the patient was taken to the recovery room.  The right internal jugular vein was visualized with ultrasound and found to be patent. It was then accessed under direct ultrasound guidance and a permanent image was recorded. A wire was placed. After skin nick and dilatation, the peel-away sheath was placed over the wire. I then turned my attention to an area under the clavicle. Approximately 1-2 fingerbreadths below the clavicle a small counterincision was created and tunneled from the subclavicular  incision to the access site. Using fluoroscopic guidance, a 19 centimeter tip to cuff tunneled hemodialysis catheter was selected, and tunneled from the subclavicular incision to the access site. It was then placed through the peel-away sheath and the peel-away sheath was removed. Using fluoroscopic guidance the catheter tips were parked in the right atrium. The appropriate distal connectors were placed. It withdrew blood well and flushed easily with heparinized saline and a concentrated heparin solution was then placed. It was secured to the chest wall with 2 Prolene sutures. The access incision was closed single 4-0 Monocryl. A 4-0 Monocryl pursestring suture was placed around the exit site. Sterile dressings were placed. The patient tolerated the procedure well and was taken to the recovery room in stable condition.  COMPLICATIONS: None  CONDITION: Stable  Leotis Pain, MD 10/06/2020 8:51 AM   This note was created with Dragon Medical transcription system. Any errors in dictation are purely unintentional.

## 2020-10-06 NOTE — H&P (Signed)
[facility] Admission History & Physical  MRN : [id number]  Brianna Dodson is a 87 y.o. (1934-05-19) female who presents with chief complaint of No chief complaint on file. Marland Kitchen  History of Present Illness:  I am asked to evaluate the patient by her nephrologist. She has CKD and now has progressed to ESRD and requires dialysis. We are asked to place a permcath ASAP. She has extra fluid and some shortness of breath. No fever or chills.   PMH CKD/ESRD    Current Facility-Administered Medications  Medication Dose Route Frequency Provider Last Rate Last Admin  . 0.9 %  sodium chloride infusion   Intravenous Continuous Kris Hartmann, NP 10 mL/hr at 10/06/20 0735 New Bag at 10/06/20 0735  . ceFAZolin (ANCEF) 1-4 GM/50ML-% IVPB           . ceFAZolin (ANCEF) IVPB 1 g/50 mL premix  1 g Intravenous Once Eulogio Ditch E, NP      . diphenhydrAMINE (BENADRYL) injection 50 mg  50 mg Intravenous Once PRN Kris Hartmann, NP      . famotidine (PEPCID) tablet 40 mg  40 mg Oral Once PRN Kris Hartmann, NP      . fentaNYL (SUBLIMAZE) 100 MCG/2ML injection           . HYDROmorphone (DILAUDID) injection 1 mg  1 mg Intravenous Once PRN Eulogio Ditch E, NP      . methylPREDNISolone sodium succinate (SOLU-MEDROL) 125 mg/2 mL injection 125 mg  125 mg Intravenous Once PRN Kris Hartmann, NP      . midazolam (VERSED) 2 MG/2ML injection           . midazolam (VERSED) 2 MG/ML syrup 8 mg  8 mg Oral Once PRN Kris Hartmann, NP      . ondansetron [facility]) injection 4 mg  4 mg Intravenous Q6H PRN Kris Hartmann, NP         Past Surgical History:  Procedure Laterality Date  . ABDOMINAL HYSTERECTOMY  1985  . LOOP RECORDER INSERTION N/A 07/30/2019   Procedure: LOOP RECORDER INSERTION;  Surgeon: Isaias Cowman, MD;  Location: [facility];  Service: Cardiovascular;  Laterality: N/A;  . TEE WITHOUT CARDIOVERSION N/A 04/14/2019   Procedure: TRANSESOPHAGEAL ECHOCARDIOGRAM (TEE);   Surgeon: Teodoro Spray, MD;  Location: [facility];  Service: Cardiovascular;  Laterality: N/A;     Social History   Tobacco Use  . Smoking status: Never Smoker  . Smokeless tobacco: Never Used  Substance Use Topics  . Alcohol use: No  . Drug use: No     Family History  Problem Relation Age of Onset  . Stroke Mother   . Diabetes Mother   . Heart disease Father   . Kidney disease Sister   . Diabetes Sister   . Kidney disease Brother   . Heart disease Sister   . Diabetes Sister   . Diabetes Sister   . Diabetes Sister   . Kidney disease Sister   . Heart disease Sister   . Kidney disease Brother        kidney transplant  . Early death Brother 56       Truck Accident - died  . Heart disease Brother     No family history of bleeding or clotting disorders, autoimmune disease or porphyria  Allergies  Allergen Reactions  . Nsaids     CKD stage III - Avoid all nephrotoxic drugs     REVIEW OF  SYSTEMS (Negative unless checked)  Constitutional: [] Weight loss  [] Fever  [] Chills Cardiac: [] Chest pain   [] Chest pressure   [] Palpitations   [] Shortness of breath when laying flat   [] Shortness of breath at rest   [x] Shortness of breath with exertion. Vascular:  [] Pain in legs with walking   [] Pain in legs at rest   [] Pain in legs when laying flat   [] Claudication   [] Pain in feet when walking  [] Pain in feet at rest  [] Pain in feet when laying flat   [] History of DVT   [] Phlebitis   [] Swelling in legs   [] Varicose veins   [] Non-healing ulcers Pulmonary:   [] Uses home oxygen   [] Productive cough   [] Hemoptysis   [] Wheeze  [] COPD   [] Asthma Neurologic:  [] Dizziness  [] Blackouts   [] Seizures   [] History of stroke   [] History of TIA  [] Aphasia   [] Temporary blindness   [] Dysphagia   [] Weakness or numbness in arms   [] Weakness or numbness in legs Musculoskeletal:  [] Arthritis   [] Joint swelling   [] Joint pain   [] Low back pain Hematologic:  [] Easy bruising  [] Easy bleeding    [] Hypercoagulable state   [x] Anemic  [] Hepatitis Gastrointestinal:  [] Blood in stool   [] Vomiting blood  [] Gastroesophageal reflux/heartburn   [] Difficulty swallowing. Genitourinary:  [x] Chronic kidney disease   [] Difficult urination  [] Frequent urination  [] Burning with urination   [] Blood in urine Skin:  [] Rashes   [] Ulcers   [] Wounds Psychological:  [] History of anxiety   []  History of major depression.  Physical Examination  Vitals:   10/06/20 0716  BP: (!) 147/51  Pulse: 62  Resp: 20  Temp: 98.3 F (36.8 C)  TempSrc: Oral  SpO2: 99%  Weight: 77.6 kg  Height: 5\' 5"  (1.651 m)   Body mass index is 28.46 kg/m. Gen: WD/WN, NAD Head: [place]/AT, No temporalis wasting.  Ear/Nose/Throat: Hearing grossly intact, nares w/o erythema or drainage, oropharynx w/o Erythema/Exudate,  Eyes: Conjunctiva clear, sclera non-icteric Neck: Trachea midline.  No JVD.  Pulmonary:  Good air movement, respirations not labored, no use of accessory muscles.  Cardiac: RRR, normal S1, S2. Vascular:  Vessel Right Left  Radial Palpable Palpable  Gastrointestinal: soft, non-tender/non-distended. No guarding/reflex.  Musculoskeletal: M/S 5/5 throughout.  Extremities without ischemic changes.  No deformity or atrophy. LE edema Neurologic: Sensation grossly intact in extremities.  Symmetrical.  Speech is fluent. Motor exam as listed above. Psychiatric: Judgment intact, Mood & affect appropriate for pt's clinical situation. Dermatologic: No rashes or ulcers noted.  No cellulitis or open wounds.    CBC Lab Results  Component Value Date   WBC 4.5 09/11/2020   HGB 7.8 (L) 09/11/2020   HCT 22.7 (L) 09/11/2020   MCV 87.3 09/11/2020   PLT 125 (L) 09/11/2020    BMET    Component Value Date/Time   NA 139 09/28/2020 1143   NA 142 11/10/2013 0522   K 5.4 (H) 09/28/2020 1143   K 4.6 11/10/2013 0522   CL 107 09/28/2020 1143   CL 110 (H) 11/10/2013 0522   CO2 20 09/28/2020 1143   CO2 27 11/10/2013 0522    GLUCOSE 286 (H) 09/28/2020 1143   GLUCOSE 70 11/10/2013 0522   BUN 41 (H) 09/28/2020 1143   BUN 22 (H) 11/10/2013 0522   CREATININE 4.52 (HH) 09/28/2020 1143   CREATININE 1.12 11/10/2013 0522   CALCIUM 8.7 09/28/2020 1143   CALCIUM 8.4 (L) 11/10/2013 0522   GFRNONAA 8 (L) 09/13/2020 0218   GFRNONAA 47 (  L) 11/10/2013 0522   GFRAA 27 (L) 08/22/2019 0449   GFRAA 54 (L) 11/10/2013 0522   Estimated Creatinine Clearance: 9.4 mL/min (A) (by C-G formula based on SCr of 4.52 mg/dL [facility])).  COAG Lab Results  Component Value Date   INR 1.1 08/20/2019    Radiology CT Head Wo Contrast  Result Date: 09/10/2020 CLINICAL DATA:  Neuro deficit, stroke suspected, new left facial droop EXAM: CT HEAD WITHOUT CONTRAST TECHNIQUE: Contiguous axial images were obtained from the base of the skull through the vertex without intravenous contrast. COMPARISON:  CT brain, 08/20/2019, MR brain, 08/20/2019 FINDINGS: Brain: Substantially increased encephalomalacia of posterior right MCA territory, generally in keeping with expected evolution of acute diffusion restricting infarction identified on priors dated 08/20/2019. The most posterior region of hypodensity, in the right temporal occipital junction does not appear to have been involved on prior MR and is suspicious for an acute to subacute infarction (series 3, image 19). No evidence of hemorrhage. Underlying small-vessel white matter disease and mild global volume loss. Vascular: No hyperdense vessel or unexpected calcification. Skull: Normal. Negative for fracture or focal lesion. Sinuses/Orbits: No acute finding. Other: None. IMPRESSION: 1. Substantially increased encephalomalacia of posterior right MCA territory, generally in keeping with expected evolution of acute diffusion restricting infarction identified on priors dated 08/20/2019. 2. The most posterior region of hypodensity, in the right temporal occipital junction does not appear to have been involved on prior  MR and is suspicious for an acute to subacute infarction. This may be further evaluated by MR. 3. No evidence of hemorrhage. 4. Underlying small-vessel white matter disease and mild global volume loss. Electronically Signed   By: Eddie Candle M.D.   On: 09/10/2020 18:37   MR BRAIN WO CONTRAST  Result Date: 09/12/2020 CLINICAL DATA:  87 year old female with neurologic deficit. New left facial droop. EXAM: MRI HEAD WITHOUT CONTRAST TECHNIQUE: Multiplanar, multiecho pulse sequences of the brain and surrounding structures were obtained without intravenous contrast. COMPARISON:  Head CT 09/10/2020. Brain MRA 08/21/2019, MRI 08/20/2019. FINDINGS: The examination had to be discontinued prior to completion due to patient agitation and yelling. Subsequently axial and coronal DWI, FLAIR and SWI images only were obtained. Since 08/20/2019, abnormal diffusion centered at the insula and operculum at that time has evolved into encephalomalacia, probably with some areas of laminar necrosis (series 8, image 17 today), while there are additional new Patchy and confluent areas of restricted diffusion along both the anterior and posterior margins of the previous right MCA infarct (series 5, image 29). No malignant hemorrhagic transformation on SWI. Extensive chronic microhemorrhages in the bilateral deep gray nuclei are redemonstrated on that sequence today. No significant intracranial mass effect or midline shift, with some ex vacuo enlargement of the right lateral ventricle since last month. No contralateral left hemisphere or posterior fossa restricted diffusion. IMPRESSION: 1. Truncated exam. 2. Acute ischemia is present along the anterior - and especially posterior - margins of the Right MCA infarct that occurred last month. 3. No malignant hemorrhagic transformation or mass effect. 4. Expected developing encephalomalacia and laminar necrosis in the parenchyma affected last month. 5. Underlying fairly advanced chronic small  vessel disease. Electronically Signed   By: Genevie Ann M.D.   On: 09/12/2020 11:50   ECHOCARDIOGRAM COMPLETE  Result Date: 09/11/2020    ECHOCARDIOGRAM REPORT   Patient Name:   WYNONIA MEDERO Date of Exam: 09/11/2020 Medical Rec #:  [id number]     Height:       65.0 in Accession #:  [id number]    Weight:       174.0 lb Date of Birth:  1933/09/11      BSA:          1.864 m Patient Age:    69 years      BP:           138/56 mmHg Patient Gender: F             HR:           62 bpm. Exam Location:  Inpatient Procedure: 2D Echo, Cardiac Doppler and Color Doppler Indications:    CVA  History:        Patient has prior history of Echocardiogram examinations, most                 recent 08/21/2019. Stroke, Signs/Symptoms:Dementia; Risk                 Factors:Hypertension, Dyslipidemia and Diabetes. CKD.  Sonographer:    Dustin Flock Referring Phys: [id number] [place]  1. Left ventricular ejection fraction, by estimation, is 60 to 65%. The left ventricle has normal function. The left ventricle has no regional wall motion abnormalities. There is moderate left ventricular hypertrophy. Left ventricular diastolic parameters are consistent with Grade I diastolic dysfunction (impaired relaxation).  2. Right ventricular systolic function is normal. The right ventricular size is normal. Tricuspid regurgitation signal is inadequate for assessing PA pressure.  3. The mitral valve is normal in structure. Trivial mitral valve regurgitation. No evidence of mitral stenosis.  4. The aortic valve is normal in structure. There is mild calcification of the aortic valve. Aortic valve regurgitation is not visualized. No aortic stenosis is present.  5. The inferior vena cava is normal in size with <50% respiratory variability, suggesting right atrial pressure of 8 mmHg. Conclusion(s)/Recommendation(s): No intracardiac source of embolism detected on this transthoracic study. A transesophageal echocardiogram can be considered  to exclude cardiac source of embolism if clinically indicated. FINDINGS  Left Ventricle: Left ventricular ejection fraction, by estimation, is 60 to 65%. The left ventricle has normal function. The left ventricle has no regional wall motion abnormalities. The left ventricular internal cavity size was normal in size. There is  moderate left ventricular hypertrophy. Left ventricular diastolic parameters are consistent with Grade I diastolic dysfunction (impaired relaxation). Right Ventricle: The right ventricular size is normal. No increase in right ventricular wall thickness. Right ventricular systolic function is normal. Tricuspid regurgitation signal is inadequate for assessing PA pressure. Left Atrium: Left atrial size was normal in size. Right Atrium: Right atrial size was normal in size. Pericardium: There is no evidence of pericardial effusion. Mitral Valve: The mitral valve is normal in structure. Trivial mitral valve regurgitation. No evidence of mitral valve stenosis. Tricuspid Valve: The tricuspid valve is normal in structure. Tricuspid valve regurgitation is not demonstrated. No evidence of tricuspid stenosis. Aortic Valve: The aortic valve is normal in structure. There is mild calcification of the aortic valve. Aortic valve regurgitation is not visualized. No aortic stenosis is present. Aortic valve peak gradient measures 13.1 mmHg. Pulmonic Valve: The pulmonic valve was normal in structure. Pulmonic valve regurgitation is not visualized. No evidence of pulmonic stenosis. Aorta: The aortic root is normal in size and structure. Venous: The inferior vena cava is normal in size with less than 50% respiratory variability, suggesting right atrial pressure of 8 mmHg. IAS/Shunts: The interatrial septum was not well visualized.  LEFT VENTRICLE PLAX 2D LVIDd:  4.40 cm  Diastology LVIDs:         2.80 cm  LV e' medial:    8.70 cm/s LV PW:         1.30 cm  LV E/e' medial:  8.2 LV IVS:        1.40 cm  LV e'  lateral:   9.25 cm/s LVOT diam:     2.30 cm  LV E/e' lateral: 7.7 LV SV:         108 LV SV Index:   58 LVOT Area:     4.15 cm  RIGHT VENTRICLE RV Basal diam:  2.60 cm RV S prime:     7.51 cm/s TAPSE (M-mode): 2.7 cm LEFT ATRIUM             Index       RIGHT ATRIUM          Index LA diam:        3.90 cm 2.09 cm/m  RA Area:     7.76 cm LA Vol (A2C):   37.7 ml 20.22 ml/m RA Volume:   13.10 ml 7.03 ml/m LA Vol (A4C):   40.4 ml 21.67 ml/m LA Biplane Vol: 39.2 ml 21.02 ml/m  AORTIC VALVE AV Area (Vmax): 2.43 cm AV Vmax:        181.00 cm/s AV Peak Grad:   13.1 mmHg LVOT Vmax:      106.00 cm/s LVOT Vmean:     72.200 cm/s LVOT VTI:       0.259 m  AORTA Ao Root diam: 3.30 cm MITRAL VALVE MV Area (PHT): 3.03 cm     SHUNTS MV Decel Time: 250 msec     Systemic VTI:  0.26 m MV E velocity: 71.30 cm/s   Systemic Diam: 2.30 cm MV A velocity: 104.00 cm/s MV E/A ratio:  0.69 Cherlynn Kaiser MD Electronically signed by Cherlynn Kaiser MD Signature Date/Time: 09/11/2020/2:22:30 PM    Final    VAS US CAROTID (at [facility] and [facility] only)  Result Date: 09/12/2020 Carotid Arterial Duplex Study Indications:       Weakness and Confusion. Risk Factors:      Hypertension, hyperlipidemia, Diabetes, prior CVA. Limitations        Today's exam was limited due to the patient's inability or                    unwillingness to cooperate. Comparison Study:  Prior normal study done 06/08/20 Performing Technologist: Sharion Dove RVS  Examination Guidelines: A complete evaluation includes B-mode imaging, spectral Doppler, color Doppler, and power Doppler as needed of all accessible portions of each vessel. Bilateral testing is considered an integral part of a complete examination. Limited examinations for reoccurring indications may be performed as noted.  Right Carotid Findings: +----------+--------+--------+--------+------------------+------------------+           PSV cm/sEDV cm/sStenosisPlaque DescriptionComments            +----------+--------+--------+--------+------------------+------------------+ CCA Prox  56      10                                intimal thickening +----------+--------+--------+--------+------------------+------------------+ CCA Distal76      15                                intimal thickening +----------+--------+--------+--------+------------------+------------------+ ICA Prox  100     24  calcific                             +----------+--------+--------+--------+------------------+------------------+ ICA Distal71      22                                                   +----------+--------+--------+--------+------------------+------------------+ ECA       113     19                                                   +----------+--------+--------+--------+------------------+------------------+ +----------+--------+-------+--------+-------------------+           PSV cm/sEDV cmsDescribeArm Pressure (mmHG) +----------+--------+-------+--------+-------------------+ Subclavian115                                        +----------+--------+-------+--------+-------------------+ +---------+--------+--+--------+-+ VertebralPSV cm/s43EDV cm/s7 +---------+--------+--+--------+-+  Left Carotid Findings: +----------+--------+--------+--------+------------------+------------------+           PSV cm/sEDV cm/sStenosisPlaque DescriptionComments           +----------+--------+--------+--------+------------------+------------------+ CCA Prox  70      17                                intimal thickening +----------+--------+--------+--------+------------------+------------------+ CCA Distal75      20                                intimal thickening +----------+--------+--------+--------+------------------+------------------+ ICA Prox  77      23              calcific          Shadowing           +----------+--------+--------+--------+------------------+------------------+ ICA Distal84      27                                                   +----------+--------+--------+--------+------------------+------------------+ ECA       196     11                                                   +----------+--------+--------+--------+------------------+------------------+ +----------+--------+--------+--------+-------------------+           PSV cm/sEDV cm/sDescribeArm Pressure (mmHG) +----------+--------+--------+--------+-------------------+ [id number]                                          +----------+--------+--------+--------+-------------------+ +---------+--------+--------+------------+ VertebralPSV cm/sEDV cm/sNot assessed +---------+--------+--------+------------+   Summary: Right Carotid: Velocities in the right ICA are consistent with a 1-39% stenosis. Left Carotid: Velocities in the left ICA are consistent with a 1-39% stenosis. Vertebrals:  Right vertebral artery  demonstrates antegrade flow. Left vertebral              artery was not visualized. Subclavians: Normal flow hemodynamics were seen in bilateral subclavian              arteries. *See table(s) above for measurements and observations.  Electronically signed by Antony Contras MD on 09/12/2020 at 12:12:10 PM.    Final     Assessment/Plan 1.  End-stage renal disease requiring hemodialysis:  permcath placement today. Risks and benefits discussed 2.  Hypertension:  Patient will continue medical management; nephrology is following no changes in oral medications. 3. Diabetes mellitus:  Glucose will be monitored and oral medications been held this morning once the patient has undergone the patient's procedure po intake will be reinitiated and again Accu-Cheks will be used to assess the blood glucose level and treat as needed. The patient will be restarted on the patient's usual hypoglycemic regime    Leotis Pain,  MD  10/06/2020 8:08 AM

## 2020-10-06 NOTE — Discharge Instructions (Signed)
Moderate Conscious Sedation, Adult, Care After This sheet gives you information about how to care for yourself after your procedure. Your health care provider may also give you more specific instructions. If you have problems or questions, contact your health care provider. What can I expect after the procedure? After the procedure, it is common to have:  Sleepiness for several hours.  Impaired judgment for several hours.  Difficulty with balance.  Vomiting if you eat too soon. Follow these instructions at home: For the time period you were told by your health care provider:  Rest.  Do not participate in activities where you could fall or become injured.  Do not drive or use machinery.  Do not drink alcohol.  Do not take sleeping pills or medicines that cause drowsiness.  Do not make important decisions or sign legal documents.  Do not take care of children on your own.      Eating and drinking  Follow the diet recommended by your health care provider.  Drink enough fluid to keep your urine pale yellow.  If you vomit: ? Drink water, juice, or soup when you can drink without vomiting. ? Make sure you have little or no nausea before eating solid foods.   General instructions  Take over-the-counter and prescription medicines only as told by your health care provider.  Have a responsible adult stay with you for the time you are told. It is important to have someone help care for you until you are awake and alert.  Do not smoke.  Keep all follow-up visits as told by your health care provider. This is important. Contact a health care provider if:  You are still sleepy or having trouble with balance after 24 hours.  You feel light-headed.  You keep feeling nauseous or you keep vomiting.  You develop a rash.  You have a fever.  You have redness or swelling around the IV site. Get help right away if:  You have trouble breathing.  You have new-onset confusion at  home. Summary  After the procedure, it is common to feel sleepy, have impaired judgment, or feel nauseous if you eat too soon.  Rest after you get home. Know the things you should not do after the procedure.  Follow the diet recommended by your health care provider and drink enough fluid to keep your urine pale yellow.  Get help right away if you have trouble breathing or new-onset confusion at home. This information is not intended to replace advice given to you by your health care provider. Make sure you discuss any questions you have with your health care provider. Document Revised: 10/17/2019 Document Reviewed: 05/15/2019 Elsevier Patient Education  2021 Elsevier Inc. Tunneled Catheter Insertion, Care After This sheet gives you information about how to care for yourself after your procedure. Your health care provider may also give you more specific instructions. If you have problems or questions, contact your health care provider. What can I expect after the procedure? After the procedure, it is common to have:  Some mild redness, bruising, swelling, and pain around your catheter site.  A small amount of blood or clear fluid coming from your incisions. Follow these instructions at home: Incision care  Follow instructions from your health care provider about how to take care of your incisions. Make sure you: ? Wash your hands with soap and water before and after you change your bandages (dressings). If soap and water are not available, use hand sanitizer. ? Change your dressings   as told by your health care provider. Wash the area around your incisions with a germ-killing (antiseptic) solution when you change your dressings. ? Leave stitches (sutures), skin glue, or adhesive strips in place. These skin closures may need to stay in place for 2 weeks or longer. If adhesive strip edges start to loosen and curl up, you may trim the loose edges. Do not remove adhesive strips completely  unless your health care provider tells you to do that.  Keep your dressings clean and dry.  Check your incision areas every day for signs of infection. Check for: ? More redness, swelling, or pain. ? More fluid or blood. ? Warmth. ? Pus or a bad smell.   Catheter care  Wash your hands with soap and water before and after caring for your catheter. If soap and water are not available, use hand sanitizer.  Keep your catheter site clean and dry.  Apply an antibiotic ointment to your catheter site as told by your health care provider.  Flush your catheter as told by your health care provider. This helps prevent it from becoming clogged.  Do not open the caps on the ends of the catheter.  Do not pull on your catheter.   Medicines  Take over-the-counter and prescription medicines only as told by your health care provider.  If you were prescribed an antibiotic medicine, take it as told by your health care provider. Do not stop taking the antibiotic even if you start to feel better. Activity  Return to your normal activities as told by your health care provider. Ask your health care provider what activities are safe for you.  Follow any other activity restrictions as instructed by your health care provider.  Do not lift anything that is heavier than 10 lb (4.5 kg), or the limit that you are told, until your health care provider says that it is safe. Driving  Do not drive until your health care provider approves.  Ask your health care provider if the medicine prescribed to you requires you to avoid driving or using heavy machinery. General instructions  Follow your health care provider's specific instructions for the type of catheter that you have.  Do not take baths, swim, or use a hot tub until your health care provider approves. Ask your health care provider if you may take showers.  Keep all follow-up visits as told by your health care provider. This is important. Contact a  health care provider if:  You feel unusually weak or nauseous.  You have more redness, swelling, or pain at your incisions or around the area where your catheter is inserted.  Your catheter is not working properly.  You are unable to flush your catheter. Get help right away if:  Your catheter develops a hole or it breaks.  You have pain or swelling when fluids or medicines are being given through the catheter.  Fluid is leaking from the catheter, under the dressing, or around the dressing.  Your catheter comes loose or gets pulled completely out. If this happens, press on your catheter site firmly with a clean cloth until you can get medical help.  You have swelling in your shoulder, neck, chest, or face.  You have chest pain or difficulty breathing.  You feel dizzy or light-headed.  You have pus or a bad smell coming from your catheter site.  You have a fever or chills.  Your catheter site feels warm to the touch.  You develop bleeding from your catheter   or your insertion site, and your bleeding does not stop. Summary  After the procedure, it is common to have mild redness, swelling, and pain around your catheter site.  Return to your normal activities as told by your health care provider. Ask your health care provider what activities are safe for you.  Follow your health care provider's specific instructions for the type of catheter that you have.  Keep your catheter site and your dressings clean and dry.  Contact a health care provider if your catheter is not working properly. Get help right away if you have chest pain, fever, or difficulty breathing. This information is not intended to replace advice given to you by your health care provider. Make sure you discuss any questions you have with your health care provider. Document Revised: 06/11/2018 Document Reviewed: 06/11/2018 Elsevier Patient Education  2021 Elsevier Inc.  

## 2020-10-07 ENCOUNTER — Encounter: Payer: Self-pay | Admitting: Vascular Surgery

## 2020-10-07 ENCOUNTER — Ambulatory Visit (INDEPENDENT_AMBULATORY_CARE_PROVIDER_SITE_OTHER): Payer: Medicare Other | Admitting: Pharmacist

## 2020-10-07 ENCOUNTER — Other Ambulatory Visit: Payer: Self-pay | Admitting: Family Medicine

## 2020-10-07 ENCOUNTER — Telehealth: Payer: Self-pay | Admitting: Pharmacist

## 2020-10-07 ENCOUNTER — Telehealth (INDEPENDENT_AMBULATORY_CARE_PROVIDER_SITE_OTHER): Payer: Self-pay

## 2020-10-07 DIAGNOSIS — F0151 Vascular dementia with behavioral disturbance: Secondary | ICD-10-CM | POA: Diagnosis not present

## 2020-10-07 DIAGNOSIS — I69318 Other symptoms and signs involving cognitive functions following cerebral infarction: Secondary | ICD-10-CM | POA: Diagnosis not present

## 2020-10-07 DIAGNOSIS — E785 Hyperlipidemia, unspecified: Secondary | ICD-10-CM | POA: Diagnosis not present

## 2020-10-07 DIAGNOSIS — N185 Chronic kidney disease, stage 5: Secondary | ICD-10-CM

## 2020-10-07 DIAGNOSIS — F0280 Dementia in other diseases classified elsewhere without behavioral disturbance: Secondary | ICD-10-CM

## 2020-10-07 DIAGNOSIS — F0281 Dementia in other diseases classified elsewhere with behavioral disturbance: Secondary | ICD-10-CM | POA: Diagnosis not present

## 2020-10-07 DIAGNOSIS — E1122 Type 2 diabetes mellitus with diabetic chronic kidney disease: Secondary | ICD-10-CM

## 2020-10-07 DIAGNOSIS — I69392 Facial weakness following cerebral infarction: Secondary | ICD-10-CM | POA: Diagnosis not present

## 2020-10-07 DIAGNOSIS — I69354 Hemiplegia and hemiparesis following cerebral infarction affecting left non-dominant side: Secondary | ICD-10-CM | POA: Diagnosis not present

## 2020-10-07 DIAGNOSIS — I480 Paroxysmal atrial fibrillation: Secondary | ICD-10-CM

## 2020-10-07 DIAGNOSIS — N183 Chronic kidney disease, stage 3 unspecified: Secondary | ICD-10-CM | POA: Diagnosis not present

## 2020-10-07 DIAGNOSIS — F0150 Vascular dementia without behavioral disturbance: Secondary | ICD-10-CM

## 2020-10-07 DIAGNOSIS — G309 Alzheimer's disease, unspecified: Secondary | ICD-10-CM

## 2020-10-07 DIAGNOSIS — I6930 Unspecified sequelae of cerebral infarction: Secondary | ICD-10-CM

## 2020-10-07 MED ORDER — ELIQUIS 2.5 MG PO TABS: 2.5000 mg | ORAL_TABLET | Freq: Two times a day (BID) | ORAL | 0 refills | Status: DC

## 2020-10-07 NOTE — Telephone Encounter (Signed)
Augustin Schooling called stating that the patient's dressing on her permcath has blood and its "soaked". Patient had a permcath placement on 10/06/20 with Dr. [name]. I advised per the NP Arna Medici to have her remove the bandage to assess if there was active bleeding and she refused stating she was not going to mess with it and she was going to take the patient to the ED to be seen.

## 2020-10-07 NOTE — Chronic Care Management (AMB) (Signed)
Chronic Care Management Pharmacy Note  10/07/2020 Name:  Brianna Dodson MRN:  [id number] DOB:  11/05/1933  Subjective: Brianna Dodson is an 87 y.o. year old female who is a primary patient of Sonnenberg, Angela Adam, MD.  The CCM team was consulted for assistance with disease management and care coordination needs.    Engaged with patient by telephone for follow up visit in response to provider referral for pharmacy case management and/or care coordination services. Her granddaughter was present for the call.   Consent to Services:  The patient was given information about Chronic Care Management services, agreed to services, and gave verbal consent prior to initiation of services.  Please see initial visit note for detailed documentation.   Patient Care Team: [name], MD as PCP - General (Family Medicine) De Hollingshead, RPH-CPP (Pharmacist)  Recent office visits:  3/29 - PCP hospital f/u; renal fx improved  Recent consult visits:  4/4 - Dr. Juleen China nephrology - dialysis planning  4/5 - Dr. Manuella Ghazi neurology - scheduled EEG  4/6 - Permcath placed for dialysis access, nephrology f/u  losartan   Hospital visits: Medication Reconciliation was completed by comparing discharge summary, patient's EMR and Pharmacy list, and upon discussion with patient.  [facility]  for CVA  Medications Discontinued at Hospital Discharge: -Stopped glimepiride due to hypoglycemia  Objective:  Lab Results  Component Value Date   CREATININE 4.52 (HH) 09/28/2020   CREATININE 4.96 (H) 09/13/2020   CREATININE 4.90 (H) 09/12/2020    Lab Results  Component Value Date   HGBA1C 6.8 (H) 09/11/2020   Last diabetic Eye exam:  Lab Results  Component Value Date/Time   HMDIABEYEEXA No Retinopathy 09/15/2014 12:00 AM    Last diabetic Foot exam: No results found for: HMDIABFOOTEX      Component Value Date/Time   CHOL 87 09/11/2020 0223   TRIG 95 09/11/2020 0223    HDL 38 (L) 09/11/2020 0223   CHOLHDL 2.3 09/11/2020 0223   VLDL 19 09/11/2020 0223   LDLCALC 30 09/11/2020 0223   LDLDIRECT 98.0 01/24/2018 0912    Hepatic Function Latest Ref Rng & Units 06/09/2020 11/26/2019 08/20/2019  Total Protein 6.0 - 8.3 g/dL 7.2 6.8 7.1  Albumin 3.5 - 5.2 g/dL 3.9 4.1 3.5  AST 0 - 37 U/L _0 ALT 0 - 35 U/L _1 Alk Phosphatase 39 - 117 U/L 48 46 46  Total Bilirubin 0.2 - 1.2 mg/dL 0.6 0.4 0.9  Bilirubin, Direct 0.0 - 0.3 mg/dL - - -    Lab Results  Component Value Date/Time   TSH 2.55 06/09/2020 11:45 AM   TSH 2.32 04/21/2020 10:04 AM    CBC Latest Ref Rng & Units 09/11/2020 09/10/2020 06/09/2020  WBC 4.0 - 10.5 K/uL 4.5 4.6 4.1  Hemoglobin 12.0 - 15.0 g/dL 7.8(L) 8.3(L) 9.4(L)  Hematocrit 36.0 - 46.0 % 22.7(L) 24.9(L) 27.4(L)  Platelets 150 - 400 K/uL 125(L) 147(L) 161.0    Lab Results  Component Value Date/Time   VD25OH 28.70 (L) 06/09/2020 11:45 AM   VD25OH 41.14 01/06/2016 09:23 AM    Clinical ASCVD: Yes - hx CVA   CHA2DS2-VASc Score = 7  This indicates a 11.2% annual risk of stroke. The patient's score is based upon: CHF History: No HTN History: Yes Diabetes History: Yes Stroke History: Yes Vascular Disease History: No Age Score: 2 Gender Score: 1   Social History   Tobacco Use  Smoking Status Never Smoker  Smokeless Tobacco Never Used   BP Readings from Last 3 Encounters:  10/06/20 (!) 141/52  09/28/20 138/60  09/14/20 (!) 155/58   Pulse Readings from Last 3 Encounters:  10/06/20 63  09/28/20 61  09/14/20 63   Wt Readings from Last 3 Encounters:  10/06/20 171 lb (77.6 kg)  09/28/20 171 lb 12.8 oz (77.9 kg)  09/11/20 174 lb 9.7 oz (79.2 kg)    Assessment: Review of patient past medical history, allergies, medications, health status, including review of consultants reports, laboratory and other test data, was performed as part of comprehensive evaluation and provision of chronic care management services.    SDOH:  (Social Determinants of Health) assessments and interventions performed:  SDOH Interventions   Flowsheet Row Most Recent Value  SDOH Interventions   Financial Strain Interventions Other (Comment)  [samples]      CCM Care Plan  Allergies  Allergen Reactions  . Nsaids     CKD stage III - Avoid all nephrotoxic drugs    Medications Reviewed Today    Reviewed by Rhina Brackett, RN (Registered Nurse) on 10/06/20 at [phone]  Med List Status: RN Complete  Medication Order Taking? Sig Documenting Provider Last Dose Status Informant  amLODipine (NORVASC) 10 MG tablet [id number] Yes Take 1 tablet (10 mg total) by mouth daily. [name], MD 10/06/2020 Unknown time Active Multiple Informants           Med Note (SATTERFIELD, DARIUS E   Fri Sep 10, 2020  2:32 PM)    BESIVANCE 0.6 % SUSP [id number] Yes Apply to eye. [provider] Past Week Unknown time Active   carvedilol (COREG) 6.25 MG tablet [id number] Yes Take 1 tablet (6.25 mg total) by mouth 2 (two) times daily with a meal. [name], MD 10/06/2020 Unknown time Active Multiple Informants  ELIQUIS 2.5 MG TABS tablet [id number] Yes SMARTSIG:1 Tablet(s) By Mouth Every 12 Hours [name], MD 10/06/2020 Unknown time Active Multiple Informants  ezetimibe (ZETIA) 10 MG tablet [id number] Yes Take 1 tablet (10 mg total) by mouth daily. [name], MD 10/05/2020 Unknown time Active Multiple Informants  feeding supplement (ENSURE ENLIVE / ENSURE PLUS) LIQD [id number] Yes Take 237 mLs by mouth 3 (three) times daily between meals. Arrien, Jimmy Picket, MD 10/05/2020 Unknown time Active   Ferrous Sulfate (IRON) 28 MG TABS [id number] Yes Take 28 mg by mouth daily at 12 noon. [provider] 10/06/2020 Unknown time Active Multiple Informants  glucose blood (ACCU-CHEK GUIDE) test strip [id number] Yes USE TO CHECK BLOOD SUGARS TWICE DAILY. E11.9 [name], MD 10/06/2020 Unknown time Active Multiple Informants   hydrALAZINE (APRESOLINE) 50 MG tablet [id number] Yes Take 50 mg by mouth 2 (two) times daily. [provider] 10/06/2020 Unknown time Active Multiple Informants  Lancets [facility] ULTRASOFT) lancets [id number] Yes Use as instructed [name], MD 10/06/2020 Unknown time Active Multiple Informants  memantine (NAMENDA) 5 MG tablet [id number] Yes Take 5 mg by mouth 2 (two) times daily. [provider] 10/06/2020 Unknown time Active Multiple Informants  Multiple Vitamin (MULTIVITAMIN ADULT PO) [id number] Yes Take 1 tablet by mouth daily. [provider] 10/06/2020 Unknown time Active Multiple Informants  Multiple Vitamins-Iron (MULTIVITAMINS WITH IRON) TABS tablet [id number] Yes Take 1 tablet by mouth daily. Arrien, Jimmy Picket, MD 10/06/2020 Unknown time Active   rosuvastatin (CRESTOR) 40 MG tablet [id number] Yes Take 1 tablet (40 mg total) by mouth daily. [name], MD 10/05/2020 Unknown time Active Multiple Informants  thiamine 100 MG tablet [id number] Yes Take 1 tablet (100 mg total) by mouth daily. Arrien, Jimmy Picket, MD 10/05/2020 Unknown time Active   Vitamin D, Ergocalciferol, (DRISDOL) 50000 units CAPS capsule [id number] Yes Take 50,000 Units by mouth every 30 (thirty) days. [provider] Past Month Unknown time Active Multiple Informants           Med Note (SATTERFIELD, [place]   Fri Sep 10, 2020  2:38 PM) Patient states she only takes once a month, at the beginning of every month.           Patient Active Problem List   Diagnosis Date Noted  . Malnutrition of moderate degree 09/13/2020  . TIA (transient ischemic attack) 09/10/2020  . Anemia of chronic disease 09/10/2020  . CVA (cerebral vascular accident) ([place]) 09/10/2020  . Sleeping difficulty 07/08/2020  . Fatigue 06/09/2020  . Mixed Alzheimer's and vascular dementia ([place]) 06/09/2020  . Leukopenia 12/08/2019  . Muscle strain 12/08/2019  . Pedal edema 11/26/2019  . Dysarthria and  anarthria 11/07/2019  . Atrial fibrillation ([place]) 10/21/2019  . Nocturia 10/21/2019  . Seborrheic keratosis 10/21/2019  . Carotid stenosis 08/27/2019  . Thyroid nodule 08/27/2019  . Status post placement of implantable loop recorder 08/07/2019  . Anemia 04/18/2019  . Aphasia 04/12/2019  . Benign hypertensive renal disease 04/07/2019  . Hyperparathyroidism due to renal insufficiency ([place]) 04/07/2019  . 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 Proteinuria 04/07/2019  . History of stroke with current residual effects 03/15/2019  . Hyperlipidemia associated with type 2 diabetes mellitus ([place]) 07/25/2018  . Itching 10/23/2017  . Nevus 04/17/2017  . CKD (chronic kidney disease), stage V ([place]) 02/02/2015  . Vitamin D deficiency 02/02/2015  . Obesity (BMI 30-39.9) 08/03/2014  . DM type 2 (diabetes mellitus, type 2) ([place]) 01/29/2014  . Essential hypertension 01/13/2014  . HLD (hyperlipidemia) 01/13/2014    Immunization History  Administered Date(s) Administered  . PFIZER(Purple Top)SARS-COV-2 Vaccination 07/25/2019, 08/15/2019    Conditions to be addressed/monitored: Atrial Fibrillation, CAD, HTN, HLD, DMII and ESRD  Care Plan : Medication Management  Updates made by De Hollingshead, RPH-CPP since 10/07/2020 12:00 AM    Problem: S/p CVA, vascular dementia, CKD, T2DM     Long-Range Goal: Medication Management   Start Date: 09/09/2020  This Visit's Progress: On track  Recent Progress: On track  Priority: High  Note:   Current Barriers:  . Unable to independently afford treatment regimen . Unable to self administer medications as prescribed  Pharmacist Clinical Goal(s):  Marland Kitchen Over the next 90 days,patient will verbalize ability to afford treatment regimen . Over the next 90 days, adhere to prescribed medication regimen through collaboration with PharmD and provider.   Interventions: . 1:1  collaboration with [name], MD regarding development and update of comprehensive plan of care as evidenced by provider attestation and co-signature . Inter-disciplinary care team collaboration (see longitudinal plan of care) . Comprehensive medication review performed; medication list updated in electronic medical record  SDOH: . Patient lives at home. Aphasia s/p multiple strokes. Family helps with care. Granddaughter, Rayna Sexton, helps with medication review today. Granddaughter is not on DPR. Sister, [name], is on [place]. Marland Kitchen Receiving home health - > speech, physical therapy, occupational therapy. They report that the bathing aid was there this morning, nurse will be there later this afternoon and plans to look at permcath site, as the patient and granddaughter report that the  gauze is soaked. When I called back this afternoon, granddaughter reported that the nurse looked at the site and said to not remove the dressing.  . [facility] [place]. [facility] Once regimen stable, will consider adherence packaging from [facility] Simple Dose  Diabetes: . Uncontrolled, but more relaxed goal of <8% likely appropriate given comorbidities; current treatment: glimepiride 2 mg QAM was supposed to be discontinued, but upon med review today they have continued to give it; follows w/ [facility]  . Reviewed to discontinue glimepiride per hospital discharge and per recent visit w/ PCP.   Hypertension and Atrial Fibrillation with advanced CKD: . Uncontrolled and inappropriately managed; current treatment: amlodipine 10 mg daily, carvedilol 6.25 mg BID, hydralazine 50 mg BID, losartan 50 mg  . Permcath site placed yesterday by [facility] in prep for dialysis.  . Anticoagulant regimen: Eliquis 2.5 mg BID - no update from [facility] regarding PA for Eliquis. Upon chart review, they needed more insurance information and the patient didn't have it. They were  going to call her sister for this information, unsure if it was ever done. Marland Kitchen Contacted Dr. Lenord Carbo Drane's office for update on status of formulary exception request to allow to continue Eliquis therapy. Xarelto would be in appropriate, warfarin would not be preferable given high bleed risk . Prepared a month of sample Eliquis for patient, as cost is unaffordable with current coverage.   Hyperlipidemia and secondary ASCVD prevention . Controlled per last lipid panel; current treatment: rosuvastatin 40 mg, ezetimibe 10 mg . Antiplatelet regimen: none given concurrent DOAC . With current renal function, rosuvastatin should be maxed at 10 mg daily. With patient's extensive ASCVD history, would prefer to continue high intensity statin therapy. Contacted [facility] to see if they would like to reduce rosuvastatin to 10 mg daily or change to atorvastatin. Will also discuss w/ PCP.  Vascular Dementia and Cognitive Impairment: . Uncontrolled; current regimen: memantine 5 mg QPM - dose reduced from BID d/t reports of feeling more tired with BID dosing o Decision to avoid donepezil d/t risk of bradycardia . EEG ordered per neurology.  . Encouraged to continue current regimen at this time   Patient Goals/Self-Care Activities . Over the next 90 days, patient will:  - take medications as prescribed check blood pressure daily, document, and provide at future appointments  Follow Up Plan: Telephone follow up appointment with care management team member scheduled for: 4 weeks     Medication Assistance: Collaborating with cardiology to determine next steps  Patient[facility]  [id number] [place] [place]Phone: [phone]: [phone] Uses pill box? Yes- granddaughter notes that she fills it Pt endorses 100% compliance  Follow Up:  Patient agrees to Care Plan and Follow-up.  Plan: Telephone  follow up appointment with care management team member scheduled for:  ~ 4 weeks  Catie TDarnelle Maffucci PharmD, BBogard CPP Clinical Pharmacist [phone]  Medication Samples have been provided to the patient.  Drug name: Eliquis       Strength: 2.5 mg        Qty: 4 boxes  LOT: [id number] Exp.Date: 12/2021

## 2020-10-07 NOTE — Telephone Encounter (Signed)
Called back. Spoke with patient. See CCM documentation

## 2020-10-07 NOTE — Telephone Encounter (Signed)
  Chronic Care Management   Note  10/07/2020 Name: Brianna Dodson MRN: [id number] DOB: 12-29-1933   Attempted to contact patient for scheduled appointment for medication management support. Patient's granddaughter (not on [place]) answered, noted the patient was with the shower aid at the moment. She asked what the call was about. I noted that I was calling to review medications. Granddaughter noted that she always reviews medications for her grandmother. As granddaughter is not on DPR, I will not review patient information with granddaughter unless patient is present. I noted I would call back later today.   Additional documentation where patient's sister, [name], had called [facility] to report bleeding from permcath site. She was given instruction to unwrap, but [name] noted she would take the patient to the ED. Will follow.   Plan: - If I do not connect with the patient by end of business today, will collaborate with Care Guide to outreach to schedule follow up with me  Catie Darnelle Maffucci, PharmD, [place], [place] Pharmacist [employer] at [employer] [phone]

## 2020-10-07 NOTE — Patient Instructions (Signed)
Visit Information  PATIENT GOALS: Goals Addressed              This Visit's Progress     Patient Stated   .  Medication Management (pt-stated)        Patient Goals/Self-Care Activities . Over the next 90 days, patient will:  - take medications as prescribed check blood pressure daily, document, and provide at future appointments         The patient verbalized understanding of instructions, educational materials, and care plan provided today and agreed to receive a mailed copy of patient instructions, educational materials, and care plan.   Plan: Telephone follow up appointment with care management team member scheduled for:  ~ 4 weeks  Catie Darnelle Maffucci, PharmD, [place], [place] Clinical Pharmacist [employer] at [employer] [phone]

## 2020-10-08 ENCOUNTER — Ambulatory Visit: Payer: Medicare Other | Admitting: Pharmacist

## 2020-10-08 DIAGNOSIS — E785 Hyperlipidemia, unspecified: Secondary | ICD-10-CM

## 2020-10-08 DIAGNOSIS — G309 Alzheimer's disease, unspecified: Secondary | ICD-10-CM | POA: Diagnosis not present

## 2020-10-08 DIAGNOSIS — I480 Paroxysmal atrial fibrillation: Secondary | ICD-10-CM

## 2020-10-08 DIAGNOSIS — N185 Chronic kidney disease, stage 5: Secondary | ICD-10-CM

## 2020-10-08 DIAGNOSIS — I69354 Hemiplegia and hemiparesis following cerebral infarction affecting left non-dominant side: Secondary | ICD-10-CM | POA: Diagnosis not present

## 2020-10-08 DIAGNOSIS — F0281 Dementia in other diseases classified elsewhere with behavioral disturbance: Secondary | ICD-10-CM | POA: Diagnosis not present

## 2020-10-08 DIAGNOSIS — F0151 Vascular dementia with behavioral disturbance: Secondary | ICD-10-CM | POA: Diagnosis not present

## 2020-10-08 DIAGNOSIS — F0150 Vascular dementia without behavioral disturbance: Secondary | ICD-10-CM

## 2020-10-08 DIAGNOSIS — I69318 Other symptoms and signs involving cognitive functions following cerebral infarction: Secondary | ICD-10-CM | POA: Diagnosis not present

## 2020-10-08 DIAGNOSIS — I69392 Facial weakness following cerebral infarction: Secondary | ICD-10-CM | POA: Diagnosis not present

## 2020-10-08 NOTE — Chronic Care Management (AMB) (Signed)
  Chronic Care Management Pharmacy Note  10/08/2020 Name:  Brianna Dodson MRN:  [id number] DOB:  03/14/1934  Subjective: Brianna Dodson is an 87 y.o. year old female who is a primary patient of Sonnenberg, Eric G, MD.  The CCM team was consulted for assistance with disease management and care coordination needs.    Engaged with patient by telephone for follow up visit in response to provider referral for pharmacy case management and/or care coordination services.   Consent to Services:  The patient was given information about Chronic Care Management services, agreed to services, and gave verbal consent prior to initiation of services.  Please see initial visit note for detailed documentation.   Patient Care Team: Sonnenberg, Eric G, MD as PCP - General (Family Medicine) ,  E, RPH-CPP (Pharmacist)  Recent office visits: None since our last call  Recent consult visits: None since our last call  Hospital visits: None in previous 6 months  Objective:  Lab Results  Component Value Date   CREATININE 4.52 (HH) 09/28/2020   CREATININE 4.96 (H) 09/13/2020   CREATININE 4.90 (H) 09/12/2020    Lab Results  Component Value Date   HGBA1C 6.8 (H) 09/11/2020   Last diabetic Eye exam:  Lab Results  Component Value Date/Time   HMDIABEYEEXA No Retinopathy 09/15/2014 12:00 AM    Last diabetic Foot exam: No results found for: HMDIABFOOTEX      Component Value Date/Time   CHOL 87 09/11/2020 0223   TRIG 95 09/11/2020 0223   HDL 38 (L) 09/11/2020 0223   CHOLHDL 2.3 09/11/2020 0223   VLDL 19 09/11/2020 0223   LDLCALC 30 09/11/2020 0223   LDLDIRECT 98.0 01/24/2018 0912    Hepatic Function Latest Ref Rng & Units 06/09/2020 11/26/2019 08/20/2019  Total Protein 6.0 - 8.3 g/dL 7.2 6.8 7.1  Albumin 3.5 - 5.2 g/dL 3.9 4.1 3.5  AST 0 - 37 U/L 21 18 25  ALT 0 - 35 U/L 20 15 15  Alk Phosphatase 39 - 117 U/L 48 46 46  Total Bilirubin 0.2 - 1.2 mg/dL 0.6 0.4 0.9  Bilirubin,  Direct 0.0 - 0.3 mg/dL - - -    Lab Results  Component Value Date/Time   TSH 2.55 06/09/2020 11:45 AM   TSH 2.32 04/21/2020 10:04 AM    CBC Latest Ref Rng & Units 09/11/2020 09/10/2020 06/09/2020  WBC 4.0 - 10.5 K/uL 4.5 4.6 4.1  Hemoglobin 12.0 - 15.0 g/dL 7.8(L) 8.3(L) 9.4(L)  Hematocrit 36.0 - 46.0 % 22.7(L) 24.9(L) 27.4(L)  Platelets 150 - 400 K/uL 125(L) 147(L) 161.0    Lab Results  Component Value Date/Time   VD25OH 28.70 (L) 06/09/2020 11:45 AM   VD25OH 41.14 01/06/2016 09:23 AM    Clinical ASCVD: Yes    CHA2DS2-VASc Score = 7  This indicates a 11.2% annual risk of stroke. The patient's score is based upon: CHF History: No HTN History: Yes Diabetes History: Yes Stroke History: Yes Vascular Disease History: No Age Score: 2 Gender Score: 1   Social History   Tobacco Use  Smoking Status Never Smoker  Smokeless Tobacco Never Used   BP Readings from Last 3 Encounters:  10/06/20 (!) 141/52  09/28/20 138/60  09/14/20 (!) 155/58   Pulse Readings from Last 3 Encounters:  10/06/20 63  09/28/20 61  09/14/20 63   Wt Readings from Last 3 Encounters:  10/06/20 171 lb (77.6 kg)  09/28/20 171 lb 12.8 oz (77.9 kg)  09/11/20 174 lb 9.7 oz (79.2 kg)      Assessment: Review of patient past medical history, allergies, medications, health status, including review of consultants reports, laboratory and other test data, was performed as part of comprehensive evaluation and provision of chronic care management services.   SDOH:  (Social Determinants of Health) assessments and interventions performed:  SDOH Interventions   Flowsheet Row Most Recent Value  SDOH Interventions   Financial Strain Interventions Other (Comment)  [medication access evaluation]      CCM Care Plan  Allergies  Allergen Reactions  . Nsaids     CKD stage III - Avoid all nephrotoxic drugs    Medications Reviewed Today    Reviewed by Rhina Brackett, RN (Registered Nurse) on 10/06/20 at [phone]   Med List Status: RN Complete  Medication Order Taking? Sig Documenting Provider Last Dose Status Informant  amLODipine (NORVASC) 10 MG tablet [id number] Yes Take 1 tablet (10 mg total) by mouth daily. [name], MD 10/06/2020 Unknown time Active Multiple Informants           Med Note (SATTERFIELD, DARIUS E   Fri Sep 10, 2020  2:32 PM)    BESIVANCE 0.6 % SUSP [id number] Yes Apply to eye. [provider] Past Week Unknown time Active   carvedilol (COREG) 6.25 MG tablet [id number] Yes Take 1 tablet (6.25 mg total) by mouth 2 (two) times daily with a meal. [name], MD 10/06/2020 Unknown time Active Multiple Informants  ELIQUIS 2.5 MG TABS tablet [id number] Yes SMARTSIG:1 Tablet(s) By Mouth Every 12 Hours [name], MD 10/06/2020 Unknown time Active Multiple Informants  ezetimibe (ZETIA) 10 MG tablet [id number] Yes Take 1 tablet (10 mg total) by mouth daily. [name], MD 10/05/2020 Unknown time Active Multiple Informants  feeding supplement (ENSURE ENLIVE / ENSURE PLUS) LIQD [id number] Yes Take 237 mLs by mouth 3 (three) times daily between meals. Arrien, Jimmy Picket, MD 10/05/2020 Unknown time Active   Ferrous Sulfate (IRON) 28 MG TABS [id number] Yes Take 28 mg by mouth daily at 12 noon. [provider] 10/06/2020 Unknown time Active Multiple Informants  glucose blood (ACCU-CHEK GUIDE) test strip [id number] Yes USE TO CHECK BLOOD SUGARS TWICE DAILY. E11.9 [name], MD 10/06/2020 Unknown time Active Multiple Informants  hydrALAZINE (APRESOLINE) 50 MG tablet [id number] Yes Take 50 mg by mouth 2 (two) times daily. [provider] 10/06/2020 Unknown time Active Multiple Informants  Lancets [facility] ULTRASOFT) lancets [id number] Yes Use as instructed [name], MD 10/06/2020 Unknown time Active Multiple Informants  memantine (NAMENDA) 5 MG tablet [id number] Yes Take 5 mg by mouth 2 (two) times daily. [provider] 10/06/2020 Unknown time  Active Multiple Informants  Multiple Vitamin (MULTIVITAMIN ADULT PO) [id number] Yes Take 1 tablet by mouth daily. [provider] 10/06/2020 Unknown time Active Multiple Informants  Multiple Vitamins-Iron (MULTIVITAMINS WITH IRON) TABS tablet [id number] Yes Take 1 tablet by mouth daily. Arrien, Jimmy Picket, MD 10/06/2020 Unknown time Active   rosuvastatin (CRESTOR) 40 MG tablet [id number] Yes Take 1 tablet (40 mg total) by mouth daily. [name], MD 10/05/2020 Unknown time Active Multiple Informants  thiamine 100 MG tablet [id number] Yes Take 1 tablet (100 mg total) by mouth daily. Arrien, Jimmy Picket, MD 10/05/2020 Unknown time Active   Vitamin D, Ergocalciferol, (DRISDOL) 50000 units CAPS capsule [id number] Yes Take 50,000 Units by mouth every 30 (thirty) days. [provider] Past Month Unknown time Active Multiple Informants           Med Note (SATTERFIELD, DARIUS E  Fri Sep 10, 2020  2:38 PM) Patient states she only takes once a month, at the beginning of every month.           Patient Active Problem List   Diagnosis Date Noted  . Malnutrition of moderate degree 09/13/2020  . TIA (transient ischemic attack) 09/10/2020  . Anemia of chronic disease 09/10/2020  . CVA (cerebral vascular accident) (HCC) 09/10/2020  . Sleeping difficulty 07/08/2020  . Fatigue 06/09/2020  . Mixed Alzheimer's and vascular dementia (HCC) 06/09/2020  . Leukopenia 12/08/2019  . Muscle strain 12/08/2019  . Pedal edema 11/26/2019  . Dysarthria and anarthria 11/07/2019  . Atrial fibrillation (HCC) 10/21/2019  . Nocturia 10/21/2019  . Seborrheic keratosis 10/21/2019  . Carotid stenosis 08/27/2019  . Thyroid nodule 08/27/2019  . Status post placement of implantable loop recorder 08/07/2019  . Anemia 04/18/2019  . Aphasia 04/12/2019  . Benign hypertensive renal disease 04/07/2019  . Hyperparathyroidism due to renal insufficiency (HCC) 04/07/2019  . 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 Proteinuria 04/07/2019  . History of stroke with current residual effects 03/15/2019  . Hyperlipidemia associated with type 2 diabetes mellitus (HCC) 07/25/2018  . Itching 10/23/2017  . Nevus 04/17/2017  . CKD (chronic kidney disease), stage V (HCC) 02/02/2015  . Vitamin D deficiency 02/02/2015  . Obesity (BMI 30-39.9) 08/03/2014  . DM type 2 (diabetes mellitus, type 2) (HCC) 01/29/2014  . Essential hypertension 01/13/2014  . HLD (hyperlipidemia) 01/13/2014    Immunization History  Administered Date(s) Administered  . PFIZER(Purple Top)SARS-COV-2 Vaccination 07/25/2019, 08/15/2019    Conditions to be addressed/monitored: Atrial Fibrillation, HTN, Hypertriglyceridemia and ESRD  Care Plan : Medication Management  Updates made by ,  E, RPH-CPP since 10/08/2020 12:00 AM    Problem: S/p CVA, vascular dementia, CKD, T2DM     Long-Range Goal: Medication Management   Start Date: 09/09/2020  This Visit's Progress: On track  Recent Progress: On track  Priority: High  Note:   Current Barriers:  . Unable to independently afford treatment regimen . Unable to self administer medications as prescribed  Pharmacist Clinical Goal(s):  . Over the next 90 days,patient will verbalize ability to afford treatment regimen . Over the next 90 days, adhere to prescribed medication regimen through collaboration with PharmD and provider.   Interventions: . 1:1 collaboration with Sonnenberg, Eric G, MD regarding development and update of comprehensive plan of care as evidenced by provider attestation and co-signature . Inter-disciplinary care team collaboration (see longitudinal plan of care) . Comprehensive medication review performed; medication list updated in electronic medical record  SDOH: . Patient lives at home. Aphasia s/p multiple strokes. Family helps with care. Granddaughter, [name], helps  with medication review today. Granddaughter is not on DPR. Sister, Margaret, is on DPR. . Receiving home health - > speech, physical therapy, occupational therapy. . Needs to switch back from [facility] to [facility]. [facility]. Once regimen stable, will consider adherence packaging from [facility] Simple [facility] . Margaret notes she is worried about getting the patient into the house if she loses mobility when starting dialysis. Interested in a ramp for the front stairs vs some sort of chair lift. Will place care guide referral for support with this.  Diabetes: . Uncontrolled, but more relaxed goal of <8% likely appropriate given comorbidities; current treatment: glimepiride 2 mg QAM was supposed to be discontinued, but upon med review today they have continued to   give it; follows w/ [facility]  . Reviewed to discontinue glimepiride per hospital discharge and per recent visit w/ PCP. Reviewed w/ sister, Margaret today.   Hypertension and Atrial Fibrillation with advanced CKD: . Uncontrolled and inappropriately managed; current treatment: amlodipine 10 mg daily, carvedilol 6.25 mg BID, hydralazine 50 mg BID, losartan 50 mg  . Permcath site placed yesterday by [facility] in prep for dialysis.  . Anticoagulant regimen: Eliquis 2.5 mg BID  . Received call back from cardiology. They note they were trying to get in touch with patient's sister as more insurance information was needed to process formulary exception. When sister came to pick up Eliquis samples today, I asked her to call [facility] to discuss needs.   Hyperlipidemia and secondary ASCVD prevention . Controlled per last lipid panel; current treatment: rosuvastatin 40 mg, ezetimibe 10 mg . Antiplatelet regimen: none given concurrent DOAC . With current renal function, rosuvastatin should be maxed at 10 mg daily. Left message w/ cardiology nurse to discuss change to atorvastatin 80 mg daily. Discussed with patient's sister  today. She will help review meds when she gets back to the patient's house  Vascular Dementia and Cognitive Impairment: . Uncontrolled; current regimen: memantine 5 mg QPM - dose reduced from BID d/t reports of feeling more tired with BID dosing o Decision to avoid donepezil d/t risk of bradycardia . EEG ordered per neurology.  . Encouraged to continue current regimen at this time   Patient Goals/Self-Care Activities . Over the next 90 days, patient will:  - take medications as prescribed check blood pressure daily, document, and provide at future appointments  Follow Up Plan: Telephone follow up appointment with care management team member scheduled for: 4 weeks     Medication Assistance: Working on medication access concerns w/ cardiology  Patient's preferred pharmacy is:  [id number] [place] [place] - [address] [place] [address] Phone: [phone] Fax: [phone]  Follow Up:  Patient agrees to Care Plan and Follow-up.  Plan: Telephone follow up appointment with care management team member scheduled for:  ~ 4 weeks  Catie , PharmD, BCACP, CPP Clinical Pharmacist [facility] at [place] [phone]     

## 2020-10-08 NOTE — Patient Instructions (Addendum)
Call the cardiology office at [phone]. They need some insurance information to try to get the insurance to cover the Eliquis.   We are waiting to hear back from one of the cardiologists about changing the rosuvastatin to a medication called atorvastatin. We can't keep using the rosuvastatin because of your kidney function.   Catie Darnelle Maffucci, PharmD [phone]  Visit Information  PATIENT GOALS: Goals Addressed              This Visit's Progress     Patient Stated   .  Medication Management (pt-stated)        Patient Goals/Self-Care Activities . Over the next 90 days, patient will:  - take medications as prescribed check blood pressure daily, document, and provide at future appointments           Print copy of patient instructions, educational materials, and care plan provided in person. Plan: Telephone follow up appointment with care management team member scheduled for:  ~ 4 weeks  Catie Darnelle Maffucci, PharmD, [place], [place] Clinical Pharmacist [employer] at [employer] [phone]

## 2020-10-11 DIAGNOSIS — I69392 Facial weakness following cerebral infarction: Secondary | ICD-10-CM | POA: Diagnosis not present

## 2020-10-11 DIAGNOSIS — F0151 Vascular dementia with behavioral disturbance: Secondary | ICD-10-CM | POA: Diagnosis not present

## 2020-10-11 DIAGNOSIS — I69354 Hemiplegia and hemiparesis following cerebral infarction affecting left non-dominant side: Secondary | ICD-10-CM | POA: Diagnosis not present

## 2020-10-11 DIAGNOSIS — I69318 Other symptoms and signs involving cognitive functions following cerebral infarction: Secondary | ICD-10-CM | POA: Diagnosis not present

## 2020-10-11 DIAGNOSIS — F0281 Dementia in other diseases classified elsewhere with behavioral disturbance: Secondary | ICD-10-CM | POA: Diagnosis not present

## 2020-10-11 DIAGNOSIS — G309 Alzheimer's disease, unspecified: Secondary | ICD-10-CM | POA: Diagnosis not present

## 2020-10-12 ENCOUNTER — Telehealth: Payer: Self-pay

## 2020-10-12 DIAGNOSIS — D509 Iron deficiency anemia, unspecified: Secondary | ICD-10-CM | POA: Insufficient documentation

## 2020-10-12 DIAGNOSIS — G309 Alzheimer's disease, unspecified: Secondary | ICD-10-CM | POA: Diagnosis not present

## 2020-10-12 DIAGNOSIS — F0150 Vascular dementia without behavioral disturbance: Secondary | ICD-10-CM | POA: Insufficient documentation

## 2020-10-12 DIAGNOSIS — I69392 Facial weakness following cerebral infarction: Secondary | ICD-10-CM | POA: Diagnosis not present

## 2020-10-12 DIAGNOSIS — R509 Fever, unspecified: Secondary | ICD-10-CM | POA: Insufficient documentation

## 2020-10-12 DIAGNOSIS — R0602 Shortness of breath: Secondary | ICD-10-CM | POA: Insufficient documentation

## 2020-10-12 DIAGNOSIS — R197 Diarrhea, unspecified: Secondary | ICD-10-CM | POA: Insufficient documentation

## 2020-10-12 DIAGNOSIS — D689 Coagulation defect, unspecified: Secondary | ICD-10-CM | POA: Insufficient documentation

## 2020-10-12 DIAGNOSIS — I63431 Cerebral infarction due to embolism of right posterior cerebral artery: Secondary | ICD-10-CM | POA: Diagnosis not present

## 2020-10-12 DIAGNOSIS — F0281 Dementia in other diseases classified elsewhere with behavioral disturbance: Secondary | ICD-10-CM | POA: Diagnosis not present

## 2020-10-12 DIAGNOSIS — I639 Cerebral infarction, unspecified: Secondary | ICD-10-CM | POA: Insufficient documentation

## 2020-10-12 DIAGNOSIS — E041 Nontoxic single thyroid nodule: Secondary | ICD-10-CM | POA: Insufficient documentation

## 2020-10-12 DIAGNOSIS — F0151 Vascular dementia with behavioral disturbance: Secondary | ICD-10-CM | POA: Diagnosis not present

## 2020-10-12 DIAGNOSIS — I69354 Hemiplegia and hemiparesis following cerebral infarction affecting left non-dominant side: Secondary | ICD-10-CM | POA: Diagnosis not present

## 2020-10-12 DIAGNOSIS — R600 Localized edema: Secondary | ICD-10-CM | POA: Insufficient documentation

## 2020-10-12 DIAGNOSIS — Z23 Encounter for immunization: Secondary | ICD-10-CM | POA: Insufficient documentation

## 2020-10-12 DIAGNOSIS — N186 End stage renal disease: Secondary | ICD-10-CM | POA: Insufficient documentation

## 2020-10-12 DIAGNOSIS — E8779 Other fluid overload: Secondary | ICD-10-CM | POA: Insufficient documentation

## 2020-10-12 DIAGNOSIS — R519 Headache, unspecified: Secondary | ICD-10-CM | POA: Insufficient documentation

## 2020-10-12 DIAGNOSIS — I69318 Other symptoms and signs involving cognitive functions following cerebral infarction: Secondary | ICD-10-CM | POA: Diagnosis not present

## 2020-10-12 DIAGNOSIS — R52 Pain, unspecified: Secondary | ICD-10-CM | POA: Insufficient documentation

## 2020-10-12 DIAGNOSIS — I6529 Occlusion and stenosis of unspecified carotid artery: Secondary | ICD-10-CM | POA: Insufficient documentation

## 2020-10-12 NOTE — Telephone Encounter (Signed)
   Telephone encounter was:  Unsuccessful.  10/12/2020 Name: Brianna Dodson MRN: [id number] DOB: May 31, 1934  Unsuccessful outbound call made today to assist with:  Left message on voicemail for patient's sister [name] to return my call regarding possible resource for a chair lift.  Outreach Attempt:  1st Attempt  A HIPAA compliant voice message was left requesting a return call.  Instructed patient to call back at [phone].  Markice Torbert, AAS Paralegal, [place] . Embedded Care Coordination [facility]  Care Management  [address], [place] [address] ??[email or web address]  ?? [phone]   [email or web address]

## 2020-10-13 DIAGNOSIS — E1122 Type 2 diabetes mellitus with diabetic chronic kidney disease: Secondary | ICD-10-CM | POA: Diagnosis not present

## 2020-10-13 DIAGNOSIS — Z992 Dependence on renal dialysis: Secondary | ICD-10-CM | POA: Diagnosis not present

## 2020-10-13 DIAGNOSIS — D631 Anemia in chronic kidney disease: Secondary | ICD-10-CM | POA: Diagnosis not present

## 2020-10-13 DIAGNOSIS — N2581 Secondary hyperparathyroidism of renal origin: Secondary | ICD-10-CM | POA: Diagnosis not present

## 2020-10-13 DIAGNOSIS — N186 End stage renal disease: Secondary | ICD-10-CM | POA: Diagnosis not present

## 2020-10-14 ENCOUNTER — Ambulatory Visit: Payer: Medicare Other | Admitting: Pharmacist

## 2020-10-14 ENCOUNTER — Telehealth: Payer: Self-pay

## 2020-10-14 DIAGNOSIS — I69318 Other symptoms and signs involving cognitive functions following cerebral infarction: Secondary | ICD-10-CM | POA: Diagnosis not present

## 2020-10-14 DIAGNOSIS — I69354 Hemiplegia and hemiparesis following cerebral infarction affecting left non-dominant side: Secondary | ICD-10-CM | POA: Diagnosis not present

## 2020-10-14 DIAGNOSIS — F0151 Vascular dementia with behavioral disturbance: Secondary | ICD-10-CM | POA: Diagnosis not present

## 2020-10-14 DIAGNOSIS — E785 Hyperlipidemia, unspecified: Secondary | ICD-10-CM

## 2020-10-14 DIAGNOSIS — I69392 Facial weakness following cerebral infarction: Secondary | ICD-10-CM | POA: Diagnosis not present

## 2020-10-14 DIAGNOSIS — F0281 Dementia in other diseases classified elsewhere with behavioral disturbance: Secondary | ICD-10-CM | POA: Diagnosis not present

## 2020-10-14 DIAGNOSIS — G309 Alzheimer's disease, unspecified: Secondary | ICD-10-CM | POA: Diagnosis not present

## 2020-10-14 MED ORDER — ATORVASTATIN CALCIUM 80 MG PO TABS: 80.0000 mg | ORAL_TABLET | Freq: Every day | ORAL | 3 refills | Status: DC

## 2020-10-14 NOTE — Patient Instructions (Addendum)
The Eliquis representative told me that this card should be able to help with the particular issue we are having with Brianna Dodson.   Please call the number on the card - [phone] to activate the card. [facility]   [facility] these [facility] to [facility]. The rep said that the pharmacy needs to run Brianna Dodson's prescription insurance as primary, and run this card as secondary. Even if the primary denies coverage, this card should pick up as secondary.   Please give them my number to call if any issues - [phone] or call the office directly at [phone].   STOP ROSUVASTATIN. Start taking atorvastatin 80 mg daily. Here is how her pill box should be filled:  Morning: - Eliquis 2.5 mg  - Carvedilol 6.25 mg - Amlodipine 10 mg  - Hydralazine 50 mg - Ezetimibe 10 mg - Multivitamin  Evening:  - Eliquis 2.5 mg  - Carvedilol 6.25 mg - Hydralazine 50 mg - Atorvastatin 80 mg - Memantine 5 mg  Thanks!  Catie Darnelle Maffucci, PharmD, [place], CPP Clinical Pharmacist [place] at [facility] [phone]  Visit Information  PATIENT GOALS: Goals Addressed              This Visit's Progress     Patient Stated   .  Medication Management (pt-stated)        Patient Goals/Self-Care Activities . Over the next 90 days, patient will:  - take medications as prescribed check blood pressure daily, document, and provide at future appointments       Print copy of patient instructions, educational materials, and care plan provided in person.   Plan: Telephone follow up appointment with care management team member scheduled for:  ~ 3 weeks as previously scheduled  Catie Darnelle Maffucci, PharmD, [place], [place] Clinical Pharmacist [employer] at [employer] [phone]

## 2020-10-14 NOTE — Chronic Care Management (AMB) (Signed)
Chronic Care Management Pharmacy Note  10/14/2020 Name:  Brianna Dodson MRN:  [id number] DOB:  02-14-1934  Subjective: Brianna Dodson is an 87 y.o. year old female who is a primary patient of Sonnenberg, Angela Adam, MD.  The CCM team was consulted for assistance with disease management and care coordination needs.    Engaged with patient by telephone for medication coordination in response to provider referral for pharmacy case management and/or care coordination services.   Consent to Services:  The patient was given information about Chronic Care Management services, agreed to services, and gave verbal consent prior to initiation of services.  Please see initial visit note for detailed documentation.   Patient Care Team: [name], MD as PCP - General (Family Medicine) De Hollingshead, RPH-CPP (Pharmacist)  Recent office visits: None since our last cal  Recent consult visits: None since our last call  Hospital visits: See previous CCM documentation  Objective:  Lab Results  Component Value Date   CREATININE 4.52 (HH) 09/28/2020   CREATININE 4.96 (H) 09/13/2020   CREATININE 4.90 (H) 09/12/2020    Lab Results  Component Value Date   HGBA1C 6.8 (H) 09/11/2020   Last diabetic Eye exam:  Lab Results  Component Value Date/Time   HMDIABEYEEXA No Retinopathy 09/15/2014 12:00 AM    Last diabetic Foot exam: No results found for: HMDIABFOOTEX      Component Value Date/Time   CHOL 87 09/11/2020 0223   TRIG 95 09/11/2020 0223   HDL 38 (L) 09/11/2020 0223   CHOLHDL 2.3 09/11/2020 0223   VLDL 19 09/11/2020 0223   LDLCALC 30 09/11/2020 0223   LDLDIRECT 98.0 01/24/2018 0912    Hepatic Function Latest Ref Rng & Units 06/09/2020 11/26/2019 08/20/2019  Total Protein 6.0 - 8.3 g/dL 7.2 6.8 7.1  Albumin 3.5 - 5.2 g/dL 3.9 4.1 3.5  AST 0 - 37 U/L $Remo'21 18 25  'vPqZz$ ALT 0 - 35 U/L $Remo'20 15 15  'edZSV$ Alk Phosphatase 39 - 117 U/L 48 46 46  Total Bilirubin 0.2 - 1.2 mg/dL 0.6 0.4 0.9   Bilirubin, Direct 0.0 - 0.3 mg/dL - - -    Lab Results  Component Value Date/Time   TSH 2.55 06/09/2020 11:45 AM   TSH 2.32 04/21/2020 10:04 AM    CBC Latest Ref Rng & Units 09/11/2020 09/10/2020 06/09/2020  WBC 4.0 - 10.5 K/uL 4.5 4.6 4.1  Hemoglobin 12.0 - 15.0 g/dL 7.8(L) 8.3(L) 9.4(L)  Hematocrit 36.0 - 46.0 % 22.7(L) 24.9(L) 27.4(L)  Platelets 150 - 400 K/uL 125(L) 147(L) 161.0    Lab Results  Component Value Date/Time   VD25OH 28.70 (L) 06/09/2020 11:45 AM   VD25OH 41.14 01/06/2016 09:23 AM    Clinical ASCVD: Yes  The ASCVD Risk score Mikey Bussing DC Jr., et al., 2013) failed to calculate for the following reasons:   The 2013 ASCVD risk score is only valid for ages 93 to 14   The patient has a prior MI or stroke diagnosis     CHA2DS2-VASc Score = 7  This indicates a 11.2% annual risk of stroke. The patient's score is based upon: CHF History: No HTN History: Yes Diabetes History: Yes Stroke History: Yes Vascular Disease History: No Age Score: 2 Gender Score: 1     Social History   Tobacco Use  Smoking Status Never Smoker  Smokeless Tobacco Never Used   BP Readings from Last 3 Encounters:  10/06/20 (!) 141/52  09/28/20 138/60  09/14/20 (!) 155/58   Pulse  Readings from Last 3 Encounters:  10/06/20 63  09/28/20 61  09/14/20 63   Wt Readings from Last 3 Encounters:  10/06/20 171 lb (77.6 kg)  09/28/20 171 lb 12.8 oz (77.9 kg)  09/11/20 174 lb 9.7 oz (79.2 kg)    Assessment: Review of patient past medical history, allergies, medications, health status, including review of consultants reports, laboratory and other test data, was performed as part of comprehensive evaluation and provision of chronic care management services.   SDOH:  (Social Determinants of Health) assessments and interventions performed:    CCM Care Plan  Allergies  Allergen Reactions  . Nsaids     CKD stage III - Avoid all nephrotoxic drugs    Medications Reviewed Today    Reviewed  by Rhina Brackett, RN (Registered Nurse) on 10/06/20 at [phone]  Med List Status: RN Complete  Medication Order Taking? Sig Documenting Provider Last Dose Status Informant  amLODipine (NORVASC) 10 MG tablet [id number] Yes Take 1 tablet (10 mg total) by mouth daily. [name], MD 10/06/2020 Unknown time Active Multiple Informants           Med Note (SATTERFIELD, DARIUS E   Fri Sep 10, 2020  2:32 PM)    BESIVANCE 0.6 % SUSP [id number] Yes Apply to eye. [provider] Past Week Unknown time Active   carvedilol (COREG) 6.25 MG tablet [id number] Yes Take 1 tablet (6.25 mg total) by mouth 2 (two) times daily with a meal. [name], MD 10/06/2020 Unknown time Active Multiple Informants  ELIQUIS 2.5 MG TABS tablet [id number] Yes SMARTSIG:1 Tablet(s) By Mouth Every 12 Hours [name], MD 10/06/2020 Unknown time Active Multiple Informants  ezetimibe (ZETIA) 10 MG tablet [id number] Yes Take 1 tablet (10 mg total) by mouth daily. [name], MD 10/05/2020 Unknown time Active Multiple Informants  feeding supplement (ENSURE ENLIVE / ENSURE PLUS) LIQD [id number] Yes Take 237 mLs by mouth 3 (three) times daily between meals. Arrien, Jimmy Picket, MD 10/05/2020 Unknown time Active   Ferrous Sulfate (IRON) 28 MG TABS [id number] Yes Take 28 mg by mouth daily at 12 noon. [provider] 10/06/2020 Unknown time Active Multiple Informants  glucose blood (ACCU-CHEK GUIDE) test strip [id number] Yes USE TO CHECK BLOOD SUGARS TWICE DAILY. E11.9 [name], MD 10/06/2020 Unknown time Active Multiple Informants  hydrALAZINE (APRESOLINE) 50 MG tablet [id number] Yes Take 50 mg by mouth 2 (two) times daily. [provider] 10/06/2020 Unknown time Active Multiple Informants  Lancets [facility] ULTRASOFT) lancets [id number] Yes Use as instructed [name], MD 10/06/2020 Unknown time Active Multiple Informants  memantine (NAMENDA) 5 MG tablet [id number] Yes Take 5 mg by mouth 2 (two)  times daily. [provider] 10/06/2020 Unknown time Active Multiple Informants  Multiple Vitamin (MULTIVITAMIN ADULT PO) [id number] Yes Take 1 tablet by mouth daily. [provider] 10/06/2020 Unknown time Active Multiple Informants  Multiple Vitamins-Iron (MULTIVITAMINS WITH IRON) TABS tablet [id number] Yes Take 1 tablet by mouth daily. Arrien, Jimmy Picket, MD 10/06/2020 Unknown time Active   rosuvastatin (CRESTOR) 40 MG tablet [id number] Yes Take 1 tablet (40 mg total) by mouth daily. [name], MD 10/05/2020 Unknown time Active Multiple Informants  thiamine 100 MG tablet [id number] Yes Take 1 tablet (100 mg total) by mouth daily. Arrien, Jimmy Picket, MD 10/05/2020 Unknown time Active   Vitamin D, Ergocalciferol, (DRISDOL) 50000 units CAPS capsule [id number] Yes Take 50,000 Units by mouth every 30 (thirty) days. [provider] Past Month Unknown  time Active Multiple Informants           Med Note (SATTERFIELD, [place]aks   Fri Sep 10, 2020  2:38 PM) Patient states she only takes once a month, at the beginning of every month.           Patient Active Problem List   Diagnosis Date Noted  . Malnutrition of moderate degree 09/13/2020  . TIA (transient ischemic attack) 09/10/2020  . Anemia of chronic disease 09/10/2020  . CVA (cerebral vascular accident) ([place]) 09/10/2020  . Sleeping difficulty 07/08/2020  . Fatigue 06/09/2020  . Mixed Alzheimer's and vascular dementia ([place]) 06/09/2020  . Leukopenia 12/08/2019  . Muscle strain 12/08/2019  . Pedal edema 11/26/2019  . Dysarthria and anarthria 11/07/2019  . Atrial fibrillation ([place]) 10/21/2019  . Nocturia 10/21/2019  . Seborrheic keratosis 10/21/2019  . Carotid stenosis 08/27/2019  . Thyroid nodule 08/27/2019  . Status post placement of implantable loop recorder 08/07/2019  . Anemia 04/18/2019  . Aphasia 04/12/2019  . Benign hypertensive renal disease 04/07/2019  . Hyperparathyroidism due to renal insufficiency  ([place]) 04/07/2019  . 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 Proteinuria 04/07/2019  . History of stroke with current residual effects 03/15/2019  . Hyperlipidemia associated with type 2 diabetes mellitus ([place]) 07/25/2018  . Itching 10/23/2017  . Nevus 04/17/2017  . CKD (chronic kidney disease), stage V ([place]) 02/02/2015  . Vitamin D deficiency 02/02/2015  . Obesity (BMI 30-39.9) 08/03/2014  . DM type 2 (diabetes mellitus, type 2) ([place]) 01/29/2014  . Essential hypertension 01/13/2014  . HLD (hyperlipidemia) 01/13/2014    Immunization History  Administered Date(s) Administered  . PFIZER Comirnaty(Gray Top)Covid-19 Tri-Sucrose Vaccine 10/08/2020  . PFIZER(Purple Top)SARS-COV-2 Vaccination 07/25/2019, 08/15/2019    Conditions to be addressed/monitored: Atrial Fibrillation, HTN, HLD, DMII, Dementia and ESRD  Care Plan : Medication Management  Updates made by De Hollingshead, RPH-CPP since 10/14/2020 12:00 AM    Problem: S/p CVA, vascular dementia, CKD, T2DM     Long-Range Goal: Medication Management   Start Date: 09/09/2020  Recent Progress: On track  Priority: High  Note:   Current Barriers:  . Unable to independently afford treatment regimen . Unable to self administer medications as prescribed  Pharmacist Clinical Goal(s):  Marland Kitchen Over the next 90 days,patient will verbalize ability to afford treatment regimen . Over the next 90 days, adhere to prescribed medication regimen through collaboration with PharmD and provider.   Interventions: . 1:1 collaboration with [name], MD regarding development and update of comprehensive plan of care as evidenced by provider attestation and co-signature . Inter-disciplinary care team collaboration (see longitudinal plan of care) . Comprehensive medication review performed; medication list updated in electronic medical record  SDOH: . Patient lives  at home. Aphasia s/p multiple strokes. Family helps with care. Granddaughter, Rayna Sexton, helps with medication review today. Granddaughter is not on DPR. Sister, [name], is on [place]. Marland Kitchen Needs to switch back from [facility] to [place]. Once regimen stable, will consider adherence [facility] . Referral in for Care Guide assistance for chair lift. Margaret and Care Guide playing phone tag  Diabetes: . Uncontrolled, but more relaxed goal of <8% likely appropriate given comorbidities; current treatment: none (reducing risk of hypoglycemia)  Hypertension and Atrial Fibrillation with advanced CKD: . Uncontrolled and inappropriately managed; current treatment: amlodipine 10 mg daily, carvedilol 6.25 mg BID, hydralazine 50 mg BID, losartan 50 mg  .  Per chart review, dialysis started yesterday . Anticoagulant regimen: Eliquis 2.5 mg BID  . Cardiology working on [place]. Collaborated w/ Eliquis rep, there is a particular Eliquis Extend card that he provided that may provide coverage for Eliquis even though her commercial drug plan is not covering. Discussed with patient. She asked that I call [name] to [facility]. Reviewed with [name]. She will come pick up today and take to [place]. I have provided my direct phone number for her to give to [facility] in case issues.   Hyperlipidemia and secondary ASCVD prevention . Controlled per last lipid panel; current treatment: rosuvastatin 40 mg, ezetimibe 10 mg . Antiplatelet regimen: none given concurrent DOAC . With current renal function, rosuvastatin should be maxed at 10 mg daily. Left message w/ cardiology nurse to discuss change to atorvastatin 80 mg daily last week. Did not hear back from cardiology. Discussed w/ Dr. Caryl Bis. Will d/c rosuvastatin 40 mg daily and start atorvastatin 80 mg daily. Discussed w/ patient and sister.   Vascular Dementia and Cognitive Impairment: . Uncontrolled; current regimen: memantine 5 mg QPM -  dose reduced from BID d/t reports of feeling more tired with BID dosing o Decision to avoid donepezil d/t risk of bradycardia . EEG ordered per neurology.  . Encouraged to continue current regimen at this time   Patient Goals/Self-Care Activities . Over the next 90 days, patient will:  - take medications as prescribed check blood pressure daily, document, and provide at future appointments  Follow Up Plan: Telephone follow up appointment with care management team member scheduled for: 3 weeks as previously scheduled     Medication Assistance: None required.  Patient affirms current coverage meets needs.  [id number] [place] [address]Phone: [phone]: [phone]   Follow Up:  Patient agrees to Care Plan and Follow-up.  Plan: Telephone follow up appointment with care management team member scheduled for:  ~ 3 weeks as previously scheduled  Catie TDarnelle Maffucci PharmD, BNaranja CDonaldsonClinical Pharmacist LOccidental Petroleumat BJohnson & [phone]

## 2020-10-14 NOTE — Telephone Encounter (Signed)
Pt called [name] requesting to speak with [employer]. I advised pt that would be at [facility] office and I offered to do a warm transfer to [facility] but pt declined; pt is going to [name] today at [place] and will speak with Ms Andree Elk then. Sending FYI to [employer].

## 2020-10-15 ENCOUNTER — Telehealth: Payer: Self-pay

## 2020-10-15 DIAGNOSIS — N186 End stage renal disease: Secondary | ICD-10-CM | POA: Diagnosis not present

## 2020-10-15 DIAGNOSIS — N2581 Secondary hyperparathyroidism of renal origin: Secondary | ICD-10-CM | POA: Diagnosis not present

## 2020-10-15 DIAGNOSIS — Z992 Dependence on renal dialysis: Secondary | ICD-10-CM | POA: Diagnosis not present

## 2020-10-15 DIAGNOSIS — D631 Anemia in chronic kidney disease: Secondary | ICD-10-CM | POA: Diagnosis not present

## 2020-10-15 NOTE — Telephone Encounter (Signed)
   Telephone encounter was:  Unsuccessful.  10/15/2020 Name: Brianna Dodson MRN: [id number] DOB: Dec 21, 1933  Unsuccessful outbound call made today to assist with:  Left message on voicemail to call my number [phone] for patient's sister [name] to return my call regarding possible resource for a chair lift.   Apologized for missing earlier call out sick.   Outreach Attempt:  2nd Attempt  A HIPAA compliant voice message was left requesting a return call.  Instructed patient to call back at [phone].  Altheria Shadoan, AAS Paralegal, [place] . Embedded Care Coordination [facility]  Care Management  [address], [place] [address] ??[email or web address]  ?? [phone]   [email or web address]

## 2020-10-18 ENCOUNTER — Telehealth: Payer: Self-pay | Admitting: Family Medicine

## 2020-10-18 ENCOUNTER — Ambulatory Visit: Payer: Medicare Other | Admitting: Pharmacist

## 2020-10-18 DIAGNOSIS — G309 Alzheimer's disease, unspecified: Secondary | ICD-10-CM | POA: Diagnosis not present

## 2020-10-18 DIAGNOSIS — I480 Paroxysmal atrial fibrillation: Secondary | ICD-10-CM | POA: Diagnosis not present

## 2020-10-18 DIAGNOSIS — D631 Anemia in chronic kidney disease: Secondary | ICD-10-CM | POA: Diagnosis not present

## 2020-10-18 DIAGNOSIS — F0150 Vascular dementia without behavioral disturbance: Secondary | ICD-10-CM | POA: Diagnosis not present

## 2020-10-18 DIAGNOSIS — E1122 Type 2 diabetes mellitus with diabetic chronic kidney disease: Secondary | ICD-10-CM

## 2020-10-18 DIAGNOSIS — N185 Chronic kidney disease, stage 5: Secondary | ICD-10-CM | POA: Diagnosis not present

## 2020-10-18 DIAGNOSIS — E785 Hyperlipidemia, unspecified: Secondary | ICD-10-CM | POA: Diagnosis not present

## 2020-10-18 DIAGNOSIS — N186 End stage renal disease: Secondary | ICD-10-CM | POA: Diagnosis not present

## 2020-10-18 DIAGNOSIS — N2581 Secondary hyperparathyroidism of renal origin: Secondary | ICD-10-CM | POA: Diagnosis not present

## 2020-10-18 DIAGNOSIS — N183 Chronic kidney disease, stage 3 unspecified: Secondary | ICD-10-CM | POA: Diagnosis not present

## 2020-10-18 DIAGNOSIS — Z992 Dependence on renal dialysis: Secondary | ICD-10-CM | POA: Diagnosis not present

## 2020-10-18 DIAGNOSIS — F0280 Dementia in other diseases classified elsewhere without behavioral disturbance: Secondary | ICD-10-CM | POA: Diagnosis not present

## 2020-10-18 MED ORDER — LINAGLIPTIN 5 MG PO TABS: 5.0000 mg | ORAL_TABLET | Freq: Every day | ORAL | 0 refills | Status: DC

## 2020-10-18 NOTE — Telephone Encounter (Signed)
[facility] [place] is not well studied in dialysis (it's recommendation is based on expert opinion). Let me call and get more information. This seems to be an acute change in sugars. Would like to evaluate for s/sx infection. Will keep you in the loop.

## 2020-10-18 NOTE — Telephone Encounter (Signed)
Patients is currently in dialysis, patient sister stated that neither her nor patient remember her taking that medication. They would like it sent over [facility] [place].

## 2020-10-18 NOTE — Telephone Encounter (Signed)
Pt sister called and said that her Blood Sugars have been running in the 300's since Friday and they wanted to discuss her medication list

## 2020-10-18 NOTE — Telephone Encounter (Signed)
Patient stated her sugars have been in the 300, patient sister stated patient used to take glipizide. She believes patient needs to be placed back on a diabetic medication. Please advise.

## 2020-10-18 NOTE — Telephone Encounter (Signed)
With her current kidney function we cannot use glipizide.  It looks like she was on Tradjenta at some point in the past.  Did she have an issue with this?  That may be the best option at this time.

## 2020-10-18 NOTE — Chronic Care Management (AMB) (Signed)
**Note Brianna-Identified via Obfuscation** Chronic Care Management Pharmacy Note  10/18/2020 Name:  Brianna Dodson MRN:  [id number] DOB:  Sep 18, 1933  Subjective: Brianna Dodson is an 87 y.o. year old female who is a primary patient of Dodson, Brianna Adam, MD.  The CCM team was consulted for assistance with disease management and care coordination needs.    Attempted to engage with patient by telephone, she was at dialysis, but then spoke with her sister, Brianna Dodson (on [place]) for medication management and access follow up in response to provider referral for pharmacy case management and/or care coordination services.   Consent to Services:  The patient was given information about Chronic Care Management services, agreed to services, and gave verbal consent prior to initiation of services.  Please see initial visit note for detailed documentation.   Patient Care Team: Brianna Haven, MD as PCP - General (Family Medicine) Brianna Dodson, RPH-CPP (Pharmacist)  Recent office visits: None since our last call  Recent consult visits: None since our last call  Hospital visits: Medication Reconciliation was completed by comparing discharge summary, patient's EMR and Pharmacy list, and upon discussion with patient.  [facility]  for CVA  Medications Discontinued at Hospital Discharge: -Stopped glimepiride due to hypoglycemia  Objective:  Lab Results  Component Value Date   CREATININE 4.52 (HH) 09/28/2020   CREATININE 4.96 (H) 09/13/2020   CREATININE 4.90 (H) 09/12/2020    Lab Results  Component Value Date   HGBA1C 6.8 (H) 09/11/2020   Last diabetic Eye exam:  Lab Results  Component Value Date/Time   HMDIABEYEEXA No Retinopathy 09/15/2014 12:00 AM    Last diabetic Foot exam: No results found for: HMDIABFOOTEX      Component Value Date/Time   CHOL 87 09/11/2020 0223   TRIG 95 09/11/2020 0223   HDL 38 (L) 09/11/2020 0223   CHOLHDL 2.3 09/11/2020 0223   VLDL 19 09/11/2020 0223   LDLCALC  30 09/11/2020 0223   LDLDIRECT 98.0 01/24/2018 0912    Hepatic Function Latest Ref Rng & Units 06/09/2020 11/26/2019 08/20/2019  Total Protein 6.0 - 8.3 g/dL 7.2 6.8 7.1  Albumin 3.5 - 5.2 g/dL 3.9 4.1 3.5  AST 0 - 37 U/L _0 ALT 0 - 35 U/L _1 Alk Phosphatase 39 - 117 U/L 48 46 46  Total Bilirubin 0.2 - 1.2 mg/dL 0.6 0.4 0.9  Bilirubin, Direct 0.0 - 0.3 mg/dL - - -    Lab Results  Component Value Date/Time   TSH 2.55 06/09/2020 11:45 AM   TSH 2.32 04/21/2020 10:04 AM    CBC Latest Ref Rng & Units 09/11/2020 09/10/2020 06/09/2020  WBC 4.0 - 10.5 K/uL 4.5 4.6 4.1  Hemoglobin 12.0 - 15.0 g/dL 7.8(L) 8.3(L) 9.4(L)  Hematocrit 36.0 - 46.0 % 22.7(L) 24.9(L) 27.4(L)  Platelets 150 - 400 K/uL 125(L) 147(L) 161.0    Lab Results  Component Value Date/Time   VD25OH 28.70 (L) 06/09/2020 11:45 AM   VD25OH 41.14 01/06/2016 09:23 AM    Clinical ASCVD: Yes  The ASCVD Risk score Mikey Bussing DC Jr., et al., 2013) failed to calculate for the following reasons:   The 2013 ASCVD risk score is only valid for ages 23 to 59   The patient has a prior MI or stroke diagnosis    O  Social History   Tobacco Use  Smoking Status Never Smoker  Smokeless Tobacco Never Used   BP Readings from Last 3 Encounters:  10/06/20 (!) 141/52  09/28/20 138/60  09/14/20 (!) 155/58   Pulse Readings from Last 3 Encounters:  10/06/20 63  09/28/20 61  09/14/20 63   Wt Readings from Last 3 Encounters:  10/06/20 171 lb (77.6 kg)  09/28/20 171 lb 12.8 oz (77.9 kg)  09/11/20 174 lb 9.7 oz (79.2 kg)    Assessment: Review of patient past medical history, allergies, medications, health status, including review of consultants reports, laboratory and other test data, was performed as part of comprehensive evaluation and provision of chronic care management services.   SDOH:  (Social Determinants of Health) assessments and interventions performed:  SDOH Interventions   Flowsheet Row Most Recent Value  SDOH  Interventions   Financial Strain Interventions Other (Comment)  [medication access review]      CCM Care Plan  Allergies  Allergen Reactions  . Nsaids     CKD stage III - Avoid all nephrotoxic drugs    Medications Reviewed Today    Reviewed by Rhina Brackett, RN (Registered Nurse) on 10/06/20 at [phone]  Med List Status: RN Complete  Medication Order Taking? Sig Documenting Provider Last Dose Status Informant  amLODipine (NORVASC) 10 MG tablet [id number] Yes Take 1 tablet (10 mg total) by mouth daily. Brianna Haven, MD 10/06/2020 Unknown time Active Multiple Informants           Med Note (SATTERFIELD, DARIUS E   Fri Sep 10, 2020  2:32 PM)    BESIVANCE 0.6 % SUSP [id number] Yes Apply to eye. [provider] Past Week Unknown time Active   carvedilol (COREG) 6.25 MG tablet [id number] Yes Take 1 tablet (6.25 mg total) by mouth 2 (two) times daily with a meal. Brianna Haven, MD 10/06/2020 Unknown time Active Multiple Informants  ELIQUIS 2.5 MG TABS tablet [id number] Yes SMARTSIG:1 Tablet(s) By Mouth Every 12 Hours Brianna Haven, MD 10/06/2020 Unknown time Active Multiple Informants  ezetimibe (ZETIA) 10 MG tablet [id number] Yes Take 1 tablet (10 mg total) by mouth daily. Brianna Haven, MD 10/05/2020 Unknown time Active Multiple Informants  feeding supplement (ENSURE ENLIVE / ENSURE PLUS) LIQD [id number] Yes Take 237 mLs by mouth 3 (three) times daily between meals. Arrien, Jimmy Picket, MD 10/05/2020 Unknown time Active   Ferrous Sulfate (IRON) 28 MG TABS [id number] Yes Take 28 mg by mouth daily at 12 noon. [provider] 10/06/2020 Unknown time Active Multiple Informants  glucose blood (ACCU-CHEK GUIDE) test strip [id number] Yes USE TO CHECK BLOOD SUGARS TWICE DAILY. E11.9 Brianna Haven, MD 10/06/2020 Unknown time Active Multiple Informants  hydrALAZINE (APRESOLINE) 50 MG tablet [id number] Yes Take 50 mg by mouth 2 (two) times daily. [provider] 10/06/2020  Unknown time Active Multiple Informants  Lancets [facility] ULTRASOFT) lancets [id number] Yes Use as instructed Brianna Haven, MD 10/06/2020 Unknown time Active Multiple Informants  memantine (NAMENDA) 5 MG tablet [id number] Yes Take 5 mg by mouth 2 (two) times daily. [provider] 10/06/2020 Unknown time Active Multiple Informants  Multiple Vitamin (MULTIVITAMIN ADULT PO) [id number] Yes Take 1 tablet by mouth daily. [provider] 10/06/2020 Unknown time Active Multiple Informants  Multiple Vitamins-Iron (MULTIVITAMINS WITH IRON) TABS tablet [id number] Yes Take 1 tablet by mouth daily. Arrien, Jimmy Picket, MD 10/06/2020 Unknown time Active   rosuvastatin (CRESTOR) 40 MG tablet [id number] Yes Take 1 tablet (40 mg total) by mouth daily. Brianna Haven, MD 10/05/2020 Unknown time Active Multiple Informants  thiamine 100 MG tablet [id number] Yes Take 1 tablet (100 mg total) by mouth daily.  Arrien, Jimmy Picket, MD 10/05/2020 Unknown time Active   Vitamin D, Ergocalciferol, (DRISDOL) 50000 units CAPS capsule [id number] Yes Take 50,000 Units by mouth every 30 (thirty) days. [provider] Past Month Unknown time Active Multiple Informants           Med Note (SATTERFIELD, [place]   Fri Sep 10, 2020  2:38 PM) Patient states she only takes once a month, at the beginning of every month.           Patient Active Problem List   Diagnosis Date Noted  . Malnutrition of moderate degree 09/13/2020  . TIA (transient ischemic attack) 09/10/2020  . Anemia of chronic disease 09/10/2020  . CVA (cerebral vascular accident) ([place]) 09/10/2020  . Sleeping difficulty 07/08/2020  . Fatigue 06/09/2020  . Mixed Alzheimer's and vascular dementia ([place]) 06/09/2020  . Leukopenia 12/08/2019  . Muscle strain 12/08/2019  . Pedal edema 11/26/2019  . Dysarthria and anarthria 11/07/2019  . Atrial fibrillation ([place]) 10/21/2019  . Nocturia 10/21/2019  . Seborrheic keratosis 10/21/2019  . Carotid  stenosis 08/27/2019  . Thyroid nodule 08/27/2019  . Status post placement of implantable loop recorder 08/07/2019  . Anemia 04/18/2019  . Aphasia 04/12/2019  . Benign hypertensive renal disease 04/07/2019  . Hyperparathyroidism due to renal insufficiency ([place]) 04/07/2019  . 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 Proteinuria 04/07/2019  . History of stroke with current residual effects 03/15/2019  . Hyperlipidemia associated with type 2 diabetes mellitus ([place]) 07/25/2018  . Itching 10/23/2017  . Nevus 04/17/2017  . CKD (chronic kidney disease), stage V ([place]) 02/02/2015  . Vitamin D deficiency 02/02/2015  . Obesity (BMI 30-39.9) 08/03/2014  . DM type 2 (diabetes mellitus, type 2) ([place]) 01/29/2014  . Essential hypertension 01/13/2014  . HLD (hyperlipidemia) 01/13/2014    Immunization History  Administered Date(s) Administered  . PFIZER Comirnaty(Gray Top)Covid-19 Tri-Sucrose Vaccine 10/08/2020  . PFIZER(Purple Top)SARS-COV-2 Vaccination 07/25/2019, 08/15/2019    Conditions to be addressed/monitored: HTN, HLD, DMII and ESRD  Care Plan : Medication Management  Updates made by Brianna Dodson, RPH-CPP since 10/18/2020 12:00 AM    Problem: S/p CVA, vascular dementia, CKD, T2DM     Long-Range Goal: Medication Management   Start Date: 09/09/2020  This Visit's Progress: On track  Recent Progress: On track  Priority: High  Note:   Current Barriers:  . Unable to independently afford treatment regimen . Unable to self administer medications as prescribed  Pharmacist Clinical Goal(s):  Marland Kitchen Over the next 90 days,patient will verbalize ability to afford treatment regimen . Over the next 90 days, adhere to prescribed medication regimen through collaboration with PharmD and provider.   Interventions: . 1:1 collaboration with Brianna Haven, MD regarding development and update of comprehensive plan of  care as evidenced by provider attestation and co-signature . Inter-disciplinary care team collaboration (see longitudinal plan of care) . Comprehensive medication review performed; medication list updated in electronic medical record  SDOH: . Patient lives at home. Aphasia s/p multiple strokes. Family helps with care. Granddaughter, Rayna Sexton, helps with medication review today. Granddaughter is not on DPR. Sister, Brianna Dodson, is on [place]. . Once regimen stable, will consider adherence packaging from [facility] . Referral in for Care Guide assistance for chair lift. Margaret and Care Guide playing phone tag  Diabetes: . Uncontrolled, but more relaxed goal of <8% likely appropriate given comorbidities; current treatment: none  . Patient's sister, Brianna Dodson, called the  office today to report glucose readings in the 300s. Difficult to tell how long readings have been in the 300s. No concerns w/ new onset hyperglycemia related to an infection, Brianna Dodson reports that patient's dialysis access site is doing well, started dialysis last week. MWF.  Marland Kitchen Discussed Tradjenta vs basal insulin. Margaret requests trial of Tradjenta first, as she reports a prior episode where patient was very uncomfortable with starting basal insulin. Provided samples of Tradjenta 5 mg daily to start. Requested that patient check glucose BID - fasting and 2 hour post prandial. Sister verbalized understanding  Hypertension and Atrial Fibrillation with advanced CKD: . Uncontrolled and inappropriately managed; current treatment: amlodipine 10 mg daily, carvedilol 6.25 mg BID, hydralazine 50 mg BID, losartan 50 mg  . Per chart review, dialysis started last week . Anticoagulant regimen: Eliquis 2.5 mg BID  . Brianna Dodson notes that she activated the Eliquis savings card and [facility] cardiology to notify them. . Encouraged patient and sister to notify nephrology/dialysis team about her medication  administration pattern in case antihypertensive medications need to be adjusted   Hyperlipidemia and secondary ASCVD prevention . Controlled per last lipid panel; current treatment: ezetimibe 10 mg, atorvastatin 80 mg daily started last week . Previous medications: rosuvastatin (d/c d/t renal function) . Antiplatelet regimen: none given concurrent DOAC . Continue current regimen at this time. Recommend lipid panel + liver function enzymes in 6-12 weeks.   Vascular Dementia and Cognitive Impairment: . Uncontrolled; current regimen: memantine 5 mg QPM - dose reduced from BID d/t reports of feeling more tired with BID dosing o Decision to avoid donepezil d/t risk of bradycardia . EEG ordered per neurology.  . Encouraged to continue current regimen at this time   Patient Goals/Self-Care Activities . Over the next 90 days, patient will:  - take medications as prescribed check blood pressure daily, document, and provide at future appointments  Follow Up Plan: Telephone follow up appointment with care management team member scheduled for: 2 weeks      Medication Assistance: None required.  Patient affirms current coverage meets needs.  [facility]  [id number] [place] [id number]: [phone]: [phone]  Follow Up:  Patient agrees to Care Plan and Follow-up.  Plan: Telephone follow up appointment with care management team member scheduled for:  ~ 2 weeks  Catie TDarnelle Maffucci PharmD, BMontaqua CLa ComaClinical Pharmacist LOcc[place] [facility] [employer]Johnson [employer] [phone]

## 2020-10-18 NOTE — Patient Instructions (Addendum)
Start Tradjenta 5 mg once daily. Add this to your morning medications.   Please check your blood sugar TWICE DAILY - 1) fasting, before eating breakfast and 2) about 2 hours after your biggest meal. Write down these readings. We will discuss at our next appointment.   We may need to think about using a once daily shot of insulin to help with your blood sugars moving forward.   Take this list with you to dialysis as well. Sometimes they adjust blood pressure medications based upon what happens with dialysis, so it would be good for them to know when you take each of your blood pressure medications.  Please fill your pill box as below:   Morning: - Eliquis 2.5 mg  - Carvedilol 6.25 mg - Amlodipine 10 mg  - Hydralazine 50 mg - Ezetimibe 10 mg - Multivitamin - Tradjenta 5 mg - new medication for blood sugars.  Evening:  - Eliquis 2.5 mg  - Carvedilol 6.25 mg - Hydralazine 50 mg - Atorvastatin 80 mg - Memantine 5 mg   Thanks! Catie Darnelle Maffucci, PharmD [phone]  Visit Information  PATIENT GOALS: Goals Addressed              This Visit's Progress     Patient Stated   .  Medication Management (pt-stated)        Patient Goals/Self-Care Activities . Over the next 90 days, patient will:  - take medications as prescribed check blood pressure daily, document, and provide at future appointments        Print copy of patient instructions, educational materials, and care plan provided in person.  Plan: Telephone follow up appointment with care management team member scheduled for:  ~ 2 weeks  Catie Darnelle Maffucci, PharmD, [place], [place] Clinical Pharmacist [employer] at [employer] [phone]

## 2020-10-18 NOTE — Telephone Encounter (Signed)
See CCM encounter for follow-up on this.

## 2020-10-19 DIAGNOSIS — F0281 Dementia in other diseases classified elsewhere with behavioral disturbance: Secondary | ICD-10-CM | POA: Diagnosis not present

## 2020-10-19 DIAGNOSIS — I69318 Other symptoms and signs involving cognitive functions following cerebral infarction: Secondary | ICD-10-CM | POA: Diagnosis not present

## 2020-10-19 DIAGNOSIS — I69354 Hemiplegia and hemiparesis following cerebral infarction affecting left non-dominant side: Secondary | ICD-10-CM | POA: Diagnosis not present

## 2020-10-19 DIAGNOSIS — G309 Alzheimer's disease, unspecified: Secondary | ICD-10-CM | POA: Diagnosis not present

## 2020-10-19 DIAGNOSIS — F0151 Vascular dementia with behavioral disturbance: Secondary | ICD-10-CM | POA: Diagnosis not present

## 2020-10-19 DIAGNOSIS — I69392 Facial weakness following cerebral infarction: Secondary | ICD-10-CM | POA: Diagnosis not present

## 2020-10-20 ENCOUNTER — Telehealth: Payer: Self-pay

## 2020-10-20 DIAGNOSIS — N2581 Secondary hyperparathyroidism of renal origin: Secondary | ICD-10-CM | POA: Diagnosis not present

## 2020-10-20 DIAGNOSIS — D631 Anemia in chronic kidney disease: Secondary | ICD-10-CM | POA: Diagnosis not present

## 2020-10-20 DIAGNOSIS — F0151 Vascular dementia with behavioral disturbance: Secondary | ICD-10-CM | POA: Diagnosis not present

## 2020-10-20 DIAGNOSIS — F0281 Dementia in other diseases classified elsewhere with behavioral disturbance: Secondary | ICD-10-CM | POA: Diagnosis not present

## 2020-10-20 DIAGNOSIS — N186 End stage renal disease: Secondary | ICD-10-CM | POA: Diagnosis not present

## 2020-10-20 DIAGNOSIS — I69318 Other symptoms and signs involving cognitive functions following cerebral infarction: Secondary | ICD-10-CM | POA: Diagnosis not present

## 2020-10-20 DIAGNOSIS — G309 Alzheimer's disease, unspecified: Secondary | ICD-10-CM | POA: Diagnosis not present

## 2020-10-20 DIAGNOSIS — Z992 Dependence on renal dialysis: Secondary | ICD-10-CM | POA: Diagnosis not present

## 2020-10-20 DIAGNOSIS — I69354 Hemiplegia and hemiparesis following cerebral infarction affecting left non-dominant side: Secondary | ICD-10-CM | POA: Diagnosis not present

## 2020-10-20 DIAGNOSIS — I69392 Facial weakness following cerebral infarction: Secondary | ICD-10-CM | POA: Diagnosis not present

## 2020-10-20 NOTE — Telephone Encounter (Signed)
   Telephone encounter was:  Successful.  10/20/2020 Name: Brianna Dodson MRN: [id number] DOB: 1933-07-26  Brianna Dodson is a 87 y.o. year old female who is a primary care patient of Caryl Bis, Angela Adam, MD . The community resource team was consulted for assistance with chair lift resource  Care guide performed the following interventions: Patient provided with information about care guide support team and interviewed to confirm resource needs Returned [name]'s call regarding resource for chair lift.  Gave her the number for  [facility] [place]. Explained that referrals are taken over the phone from the patient, family member of caregiver.  No further assistance is needed at this time..  Follow Up Plan:  No further follow up planned at this time. The patient has been provided with needed resources.  Brianna Dodson, AAS Paralegal, [place] . Embedded Care Coordination [facility]  Care Management  [address], [place] [address] ??[email or web address]  ?? [phone]   [email or web address]

## 2020-10-21 DIAGNOSIS — I69354 Hemiplegia and hemiparesis following cerebral infarction affecting left non-dominant side: Secondary | ICD-10-CM | POA: Diagnosis not present

## 2020-10-21 DIAGNOSIS — I69392 Facial weakness following cerebral infarction: Secondary | ICD-10-CM | POA: Diagnosis not present

## 2020-10-21 DIAGNOSIS — F0151 Vascular dementia with behavioral disturbance: Secondary | ICD-10-CM | POA: Diagnosis not present

## 2020-10-21 DIAGNOSIS — F0281 Dementia in other diseases classified elsewhere with behavioral disturbance: Secondary | ICD-10-CM | POA: Diagnosis not present

## 2020-10-21 DIAGNOSIS — I69318 Other symptoms and signs involving cognitive functions following cerebral infarction: Secondary | ICD-10-CM | POA: Diagnosis not present

## 2020-10-21 DIAGNOSIS — G309 Alzheimer's disease, unspecified: Secondary | ICD-10-CM | POA: Diagnosis not present

## 2020-10-22 DIAGNOSIS — F0151 Vascular dementia with behavioral disturbance: Secondary | ICD-10-CM | POA: Diagnosis not present

## 2020-10-22 DIAGNOSIS — G309 Alzheimer's disease, unspecified: Secondary | ICD-10-CM | POA: Diagnosis not present

## 2020-10-22 DIAGNOSIS — N186 End stage renal disease: Secondary | ICD-10-CM | POA: Diagnosis not present

## 2020-10-22 DIAGNOSIS — I69392 Facial weakness following cerebral infarction: Secondary | ICD-10-CM | POA: Diagnosis not present

## 2020-10-22 DIAGNOSIS — D631 Anemia in chronic kidney disease: Secondary | ICD-10-CM | POA: Diagnosis not present

## 2020-10-22 DIAGNOSIS — I69354 Hemiplegia and hemiparesis following cerebral infarction affecting left non-dominant side: Secondary | ICD-10-CM | POA: Diagnosis not present

## 2020-10-22 DIAGNOSIS — N2581 Secondary hyperparathyroidism of renal origin: Secondary | ICD-10-CM | POA: Diagnosis not present

## 2020-10-22 DIAGNOSIS — I69318 Other symptoms and signs involving cognitive functions following cerebral infarction: Secondary | ICD-10-CM | POA: Diagnosis not present

## 2020-10-22 DIAGNOSIS — Z992 Dependence on renal dialysis: Secondary | ICD-10-CM | POA: Diagnosis not present

## 2020-10-22 DIAGNOSIS — F0281 Dementia in other diseases classified elsewhere with behavioral disturbance: Secondary | ICD-10-CM | POA: Diagnosis not present

## 2020-10-25 DIAGNOSIS — N186 End stage renal disease: Secondary | ICD-10-CM | POA: Diagnosis not present

## 2020-10-25 DIAGNOSIS — N2581 Secondary hyperparathyroidism of renal origin: Secondary | ICD-10-CM | POA: Diagnosis not present

## 2020-10-25 DIAGNOSIS — Z992 Dependence on renal dialysis: Secondary | ICD-10-CM | POA: Diagnosis not present

## 2020-10-25 DIAGNOSIS — D631 Anemia in chronic kidney disease: Secondary | ICD-10-CM | POA: Diagnosis not present

## 2020-10-26 ENCOUNTER — Other Ambulatory Visit (INDEPENDENT_AMBULATORY_CARE_PROVIDER_SITE_OTHER): Payer: Self-pay | Admitting: Nurse Practitioner

## 2020-10-26 DIAGNOSIS — I69392 Facial weakness following cerebral infarction: Secondary | ICD-10-CM | POA: Diagnosis not present

## 2020-10-26 DIAGNOSIS — F0281 Dementia in other diseases classified elsewhere with behavioral disturbance: Secondary | ICD-10-CM | POA: Diagnosis not present

## 2020-10-26 DIAGNOSIS — F0151 Vascular dementia with behavioral disturbance: Secondary | ICD-10-CM | POA: Diagnosis not present

## 2020-10-26 DIAGNOSIS — I69318 Other symptoms and signs involving cognitive functions following cerebral infarction: Secondary | ICD-10-CM | POA: Diagnosis not present

## 2020-10-26 DIAGNOSIS — G309 Alzheimer's disease, unspecified: Secondary | ICD-10-CM | POA: Diagnosis not present

## 2020-10-26 DIAGNOSIS — I69354 Hemiplegia and hemiparesis following cerebral infarction affecting left non-dominant side: Secondary | ICD-10-CM | POA: Diagnosis not present

## 2020-10-26 DIAGNOSIS — N186 End stage renal disease: Secondary | ICD-10-CM

## 2020-10-27 ENCOUNTER — Encounter (INDEPENDENT_AMBULATORY_CARE_PROVIDER_SITE_OTHER): Payer: Medicare Other

## 2020-10-27 ENCOUNTER — Ambulatory Visit (INDEPENDENT_AMBULATORY_CARE_PROVIDER_SITE_OTHER): Payer: Medicare Other | Admitting: Nurse Practitioner

## 2020-10-27 ENCOUNTER — Other Ambulatory Visit (INDEPENDENT_AMBULATORY_CARE_PROVIDER_SITE_OTHER): Payer: Medicare Other

## 2020-10-27 DIAGNOSIS — Z992 Dependence on renal dialysis: Secondary | ICD-10-CM | POA: Diagnosis not present

## 2020-10-27 DIAGNOSIS — D631 Anemia in chronic kidney disease: Secondary | ICD-10-CM | POA: Diagnosis not present

## 2020-10-27 DIAGNOSIS — N2581 Secondary hyperparathyroidism of renal origin: Secondary | ICD-10-CM | POA: Diagnosis not present

## 2020-10-27 DIAGNOSIS — N186 End stage renal disease: Secondary | ICD-10-CM | POA: Diagnosis not present

## 2020-10-28 ENCOUNTER — Encounter (INDEPENDENT_AMBULATORY_CARE_PROVIDER_SITE_OTHER): Payer: Self-pay | Admitting: Nurse Practitioner

## 2020-10-28 DIAGNOSIS — I69318 Other symptoms and signs involving cognitive functions following cerebral infarction: Secondary | ICD-10-CM | POA: Diagnosis not present

## 2020-10-28 DIAGNOSIS — I69392 Facial weakness following cerebral infarction: Secondary | ICD-10-CM | POA: Diagnosis not present

## 2020-10-28 DIAGNOSIS — G309 Alzheimer's disease, unspecified: Secondary | ICD-10-CM | POA: Diagnosis not present

## 2020-10-28 DIAGNOSIS — F0151 Vascular dementia with behavioral disturbance: Secondary | ICD-10-CM | POA: Diagnosis not present

## 2020-10-28 DIAGNOSIS — I69354 Hemiplegia and hemiparesis following cerebral infarction affecting left non-dominant side: Secondary | ICD-10-CM | POA: Diagnosis not present

## 2020-10-28 DIAGNOSIS — F0281 Dementia in other diseases classified elsewhere with behavioral disturbance: Secondary | ICD-10-CM | POA: Diagnosis not present

## 2020-10-29 DIAGNOSIS — N2581 Secondary hyperparathyroidism of renal origin: Secondary | ICD-10-CM | POA: Diagnosis not present

## 2020-10-29 DIAGNOSIS — D631 Anemia in chronic kidney disease: Secondary | ICD-10-CM | POA: Diagnosis not present

## 2020-10-29 DIAGNOSIS — N186 End stage renal disease: Secondary | ICD-10-CM | POA: Diagnosis not present

## 2020-10-29 DIAGNOSIS — Z992 Dependence on renal dialysis: Secondary | ICD-10-CM | POA: Diagnosis not present

## 2020-10-30 DIAGNOSIS — Z992 Dependence on renal dialysis: Secondary | ICD-10-CM | POA: Diagnosis not present

## 2020-10-30 DIAGNOSIS — N186 End stage renal disease: Secondary | ICD-10-CM | POA: Diagnosis not present

## 2020-11-01 ENCOUNTER — Telehealth: Payer: Medicare Other

## 2020-11-01 DIAGNOSIS — N186 End stage renal disease: Secondary | ICD-10-CM | POA: Diagnosis not present

## 2020-11-01 DIAGNOSIS — D631 Anemia in chronic kidney disease: Secondary | ICD-10-CM | POA: Diagnosis not present

## 2020-11-01 DIAGNOSIS — Z23 Encounter for immunization: Secondary | ICD-10-CM | POA: Diagnosis not present

## 2020-11-01 DIAGNOSIS — D509 Iron deficiency anemia, unspecified: Secondary | ICD-10-CM | POA: Diagnosis not present

## 2020-11-01 DIAGNOSIS — N2581 Secondary hyperparathyroidism of renal origin: Secondary | ICD-10-CM | POA: Diagnosis not present

## 2020-11-01 DIAGNOSIS — Z992 Dependence on renal dialysis: Secondary | ICD-10-CM | POA: Diagnosis not present

## 2020-11-01 NOTE — Progress Notes (Unsigned)
Added Tradjenta at last visit with catie 4/18...sister uncomfortable with basal insulin so started tradjenta  BG?? BP readings? diaylysis sessions going well?

## 2020-11-02 ENCOUNTER — Ambulatory Visit: Payer: Medicare Other | Admitting: Pharmacist

## 2020-11-02 DIAGNOSIS — F0150 Vascular dementia without behavioral disturbance: Secondary | ICD-10-CM

## 2020-11-02 DIAGNOSIS — E1122 Type 2 diabetes mellitus with diabetic chronic kidney disease: Secondary | ICD-10-CM

## 2020-11-02 DIAGNOSIS — I6930 Unspecified sequelae of cerebral infarction: Secondary | ICD-10-CM

## 2020-11-02 DIAGNOSIS — I480 Paroxysmal atrial fibrillation: Secondary | ICD-10-CM

## 2020-11-02 DIAGNOSIS — E785 Hyperlipidemia, unspecified: Secondary | ICD-10-CM

## 2020-11-02 DIAGNOSIS — F0280 Dementia in other diseases classified elsewhere without behavioral disturbance: Secondary | ICD-10-CM

## 2020-11-02 DIAGNOSIS — G309 Alzheimer's disease, unspecified: Secondary | ICD-10-CM

## 2020-11-02 DIAGNOSIS — N185 Chronic kidney disease, stage 5: Secondary | ICD-10-CM

## 2020-11-02 DIAGNOSIS — N183 Chronic kidney disease, stage 3 unspecified: Secondary | ICD-10-CM

## 2020-11-02 NOTE — Chronic Care Management (AMB) (Signed)
  Chronic Care Management Pharmacy Note  11/02/2020 Name:  Brianna Dodson MRN:  [id number] DOB:  11/13/1933  Subjective: Brianna Dodson is an 87 y.o. year old female who is a primary patient of Dodson, Brianna G, MD.  The CCM team was consulted for assistance with disease management and care coordination needs.    Engaged with patient by telephone for follow up visit in response to provider referral for pharmacy case management and/or care coordination services. Her aide, Brianna Dodson, and her daughter, Brianna Dodson, were present for parts of the call with the patient's permission.  Consent to Services:  The patient was given information about Chronic Care Management services, agreed to services, and gave verbal consent prior to initiation of services.  Please see initial visit note for detailed documentation.   Patient Care Team: Dodson, Brianna G, MD as PCP - General (Family Medicine) ,  E, RPH-CPP (Pharmacist)  Recent office visits: None since our last call  Recent consult visits: None since our last call  [facility]  Medications Discontinued at Hospital Discharge: -Stoppedglimepiridedue to hypoglycemia  Objective:  Lab Results  Component Value Date   CREATININE 4.52 (HH) 09/28/2020   CREATININE 4.96 (H) 09/13/2020   CREATININE 4.90 (H) 09/12/2020    Lab Results  Component Value Date   HGBA1C 6.8 (H) 09/11/2020   Last diabetic Eye exam:  Lab Results  Component Value Date/Time   HMDIABEYEEXA No Retinopathy 09/15/2014 12:00 AM    Last diabetic Foot exam: No results found for: HMDIABFOOTEX      Component Value Date/Time   CHOL 87 09/11/2020 0223   TRIG 95 09/11/2020 0223   HDL 38 (L) 09/11/2020 0223   CHOLHDL 2.3 09/11/2020 0223   VLDL 19 09/11/2020 0223   LDLCALC 30 09/11/2020 0223   LDLDIRECT 98.0 01/24/2018 0912    Hepatic Function Latest Ref Rng & Units 06/09/2020 11/26/2019 08/20/2019  Total Protein  6.0 - 8.3 g/dL 7.2 6.8 7.1  Albumin 3.5 - 5.2 g/dL 3.9 4.1 3.5  AST 0 - 37 U/L 21 18 25  ALT 0 - 35 U/L 20 15 15  Alk Phosphatase 39 - 117 U/L 48 46 46  Total Bilirubin 0.2 - 1.2 mg/dL 0.6 0.4 0.9  Bilirubin, Direct 0.0 - 0.3 mg/dL - - -    Lab Results  Component Value Date/Time   TSH 2.55 06/09/2020 11:45 AM   TSH 2.32 04/21/2020 10:04 AM    CBC Latest Ref Rng & Units 09/11/2020 09/10/2020 06/09/2020  WBC 4.0 - 10.5 K/uL 4.5 4.6 4.1  Hemoglobin 12.0 - 15.0 g/dL 7.8(L) 8.3(L) 9.4(L)  Hematocrit 36.0 - 46.0 % 22.7(L) 24.9(L) 27.4(L)  Platelets 150 - 400 K/uL 125(L) 147(L) 161.0    Lab Results  Component Value Date/Time   VD25OH 28.70 (L) 06/09/2020 11:45 AM   VD25OH 41.14 01/06/2016 09:23 AM    Clinical ASCVD: Yes  The ASCVD Risk score (Goff DC Jr., et al., 2013) failed to calculate for the following reasons:   The 2013 ASCVD risk score is only valid for ages 40 to 79   The patient has a prior MI or stroke diagnosis    CHA2DS2-VASc Score = 7  }This indicates a 11.2% annual risk of stroke. The patient's score is based upon: CHF History: No HTN History: Yes Diabetes History: Yes Stroke History: Yes Vascular Disease History: No Age Score: 2 Gender Score: 1   Social History   Tobacco Use  Smoking Status Never Smoker  Smokeless   Tobacco Never Used   BP Readings from Last 3 Encounters:  10/06/20 (!) 141/52  09/28/20 138/60  09/14/20 (!) 155/58   Pulse Readings from Last 3 Encounters:  10/06/20 63  09/28/20 61  09/14/20 63   Wt Readings from Last 3 Encounters:  10/06/20 171 lb (77.6 kg)  09/28/20 171 lb 12.8 oz (77.9 kg)  09/11/20 174 lb 9.7 oz (79.2 kg)    Assessment: Review of patient past medical history, allergies, medications, health status, including review of consultants reports, laboratory and other test data, was performed as part of comprehensive evaluation and provision of chronic care management services.   SDOH:  (Social Determinants of Health)  assessments and interventions performed:    CCM Care Plan  Allergies  Allergen Reactions  . Nsaids     CKD stage III - Avoid all nephrotoxic drugs    Medications Reviewed Today    Reviewed by Scott, Vicki L, RN (Registered Nurse) on 10/06/20 at 0713  Med List Status: RN Complete  Medication Order Taking? Sig Documenting Provider Last Dose Status Informant  amLODipine (NORVASC) 10 MG tablet [id number] Yes Take 1 tablet (10 mg total) by mouth daily. Dodson, Brianna G, MD 10/06/2020 Unknown time Active Multiple Informants           Med Note (SATTERFIELD, DARIUS E   Fri Sep 10, 2020  2:32 PM)    BESIVANCE 0.6 % SUSP [id number] Yes Apply to eye. [provider] Past Week Unknown time Active   carvedilol (COREG) 6.25 MG tablet [id number] Yes Take 1 tablet (6.25 mg total) by mouth 2 (two) times daily with a meal. Dodson, Brianna G, MD 10/06/2020 Unknown time Active Multiple Informants  ELIQUIS 2.5 MG TABS tablet [id number] Yes SMARTSIG:1 Tablet(s) By Mouth Every 12 Hours Dodson, Brianna G, MD 10/06/2020 Unknown time Active Multiple Informants  ezetimibe (ZETIA) 10 MG tablet [id number] Yes Take 1 tablet (10 mg total) by mouth daily. Dodson, Brianna G, MD 10/05/2020 Unknown time Active Multiple Informants  feeding supplement (ENSURE ENLIVE / ENSURE PLUS) LIQD [id number] Yes Take 237 mLs by mouth 3 (three) times daily between meals. Arrien, Mauricio Daniel, MD 10/05/2020 Unknown time Active   Ferrous Sulfate (IRON) 28 MG TABS [id number] Yes Take 28 mg by mouth daily at 12 noon. [provider] 10/06/2020 Unknown time Active Multiple Informants  glucose blood (ACCU-CHEK GUIDE) test strip [id number] Yes USE TO CHECK BLOOD SUGARS TWICE DAILY. E11.9 Dodson, Brianna G, MD 10/06/2020 Unknown time Active Multiple Informants  hydrALAZINE (APRESOLINE) 50 MG tablet [id number] Yes Take 50 mg by mouth 2 (two) times daily. [provider] 10/06/2020 Unknown time Active Multiple Informants  Lancets (ONETOUCH  ULTRASOFT) lancets [id number] Yes Use as instructed Dodson, Brianna G, MD 10/06/2020 Unknown time Active Multiple Informants  memantine (NAMENDA) 5 MG tablet [id number] Yes Take 5 mg by mouth 2 (two) times daily. [provider] 10/06/2020 Unknown time Active Multiple Informants  Multiple Vitamin (MULTIVITAMIN ADULT PO) [id number] Yes Take 1 tablet by mouth daily. [provider] 10/06/2020 Unknown time Active Multiple Informants  Multiple Vitamins-Iron (MULTIVITAMINS WITH IRON) TABS tablet [id number] Yes Take 1 tablet by mouth daily. Arrien, Mauricio Daniel, MD 10/06/2020 Unknown time Active   rosuvastatin (CRESTOR) 40 MG tablet [id number] Yes Take 1 tablet (40 mg total) by mouth daily. Dodson, Brianna G, MD 10/05/2020 Unknown time Active Multiple Informants  thiamine 100 MG tablet [id number] Yes Take 1 tablet (100 mg total) by mouth daily. Arrien, Mauricio Daniel, MD 10/05/2020 Unknown time Active     Vitamin D, Ergocalciferol, (DRISDOL) 50000 units CAPS capsule [id number] Yes Take 50,000 Units by mouth every 30 (thirty) days. [provider] Past Month Unknown time Active Multiple Informants           Med Note (SATTERFIELD, DARIUS E   Fri Sep 10, 2020  2:38 PM) Patient states she only takes once a month, at the beginning of every month.           Patient Active Problem List   Diagnosis Date Noted  . Malnutrition of moderate degree 09/13/2020  . TIA (transient ischemic attack) 09/10/2020  . Anemia of chronic disease 09/10/2020  . CVA (cerebral vascular accident) (HCC) 09/10/2020  . Sleeping difficulty 07/08/2020  . Fatigue 06/09/2020  . Mixed Alzheimer's and vascular dementia (HCC) 06/09/2020  . Leukopenia 12/08/2019  . Muscle strain 12/08/2019  . Pedal edema 11/26/2019  . Dysarthria and anarthria 11/07/2019  . Atrial fibrillation (HCC) 10/21/2019  . Nocturia 10/21/2019  . Seborrheic keratosis 10/21/2019  . Carotid stenosis 08/27/2019  . Thyroid nodule 08/27/2019  . Status  post placement of implantable loop recorder 08/07/2019  . Anemia 04/18/2019  . Aphasia 04/12/2019  . Benign hypertensive renal disease 04/07/2019  . Hyperparathyroidism due to renal insufficiency (HCC) 04/07/2019  . 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 Proteinuria 04/07/2019  . History of stroke with current residual effects 03/15/2019  . Hyperlipidemia associated with type 2 diabetes mellitus (HCC) 07/25/2018  . Itching 10/23/2017  . Nevus 04/17/2017  . CKD (chronic kidney disease), stage V (HCC) 02/02/2015  . Vitamin D deficiency 02/02/2015  . Obesity (BMI 30-39.9) 08/03/2014  . DM type 2 (diabetes mellitus, type 2) (HCC) 01/29/2014  . Essential hypertension 01/13/2014  . HLD (hyperlipidemia) 01/13/2014    Immunization History  Administered Date(s) Administered  . PFIZER Comirnaty(Gray Top)Covid-19 Tri-Sucrose Vaccine 10/08/2020  . PFIZER(Purple Top)SARS-COV-2 Vaccination 07/25/2019, 08/15/2019    Conditions to be addressed/monitored: Atrial Fibrillation, HTN, HLD, DMII and ESRD  Care Plan : Medication Management  Updates made by ,  E, RPH-CPP since 11/02/2020 12:00 AM    Problem: S/p CVA, vascular dementia, CKD, T2DM     Long-Range Goal: Medication Management   Start Date: 09/09/2020  This Visit's Progress: On track  Recent Progress: On track  Priority: High  Note:   Current Barriers:  . Unable to independently afford treatment regimen . Unable to self administer medications as prescribed  Pharmacist Clinical Goal(s):  . Over the next 90 days,patient will verbalize ability to afford treatment regimen . Over the next 90 days, adhere to prescribed medication regimen through collaboration with PharmD and provider.   Interventions: . 1:1 collaboration with Dodson, Brianna G, MD regarding development and update of comprehensive plan of care as evidenced by provider  attestation and co-signature . Inter-disciplinary care team collaboration (see longitudinal plan of care) . Comprehensive medication review performed; medication list updated in electronic medical record  SDOH: . Patient lives at home. Aphasia s/p multiple strokes. Family helps with care. Granddaughter, Brianna Dodson, helps with medication review today. Granddaughter is not on DPR. Sister, Margaret, is on DPR. Spoke also with an aid, Brianna Dodson, that is in helping with care, medication management . Once regimen stable, will consider adherence packaging from [facility]. For now, pill box should be filled:  o AM: Eliquis, carvedilol, amlodipine, hydralazine, ezetimibe, Tradjenta o PM: Eliquis, carvedilol, hydralazine, atorvastatin, losartan, memantine  Diabetes: . Uncontrolled, but more relaxed goal of <8% likely   appropriate given comorbidities; current treatment: Tradjenta 5 mg daily . Home BG readings: 135, 171, 142, 122, 153, 121, 144 . Brianna Dodson (aid) reports significant dietary changes recently - cut out sugars, added salt, fried foods . Recommend to continue current regimen at this time along with home BG readings.  Hypertension and Atrial Fibrillation with advanced CKD: . Uncontrolled; current treatment: amlodipine 10 mg daily, carvedilol 6.25 mg BID, hydralazine 50 mg BID, losartan 50 mg - though today, patient's granddaughter  . MWF dialysis 11-4 pm . Anticoagulant regimen: Eliquis 2.5 mg BID - now affordable w/ $10/month copay card  . Reviewed regimen with aid. Appears patient has not been receiving losartan. Contacted [name] with Dr. Kolluru's office, confirmed that per dialysis documentation, losartan has not been discontinued. Called patient/Brianna Dodson/granddaughter back. I believe there has been confusion with the lisinopril/losartan issue Dr. Kolluru had stopped lisinopril a few months back. Advised Brianna Dodson/granddaughter to write a note for patient to confirm with Dr. Kolluru tomorrow when she sees  him at dialysis to confirm whether losartan should be continued. Per granddaughter, patient has been off losartan for at least 3 weeks  Hyperlipidemia and secondary ASCVD prevention . Controlled per last lipid panel; current treatment: ezetimibe 10 mg, atorvastatin 80 mg daily  . Previous medications: rosuvastatin (d/c d/t renal function) . Antiplatelet regimen: none given concurrent DOAC . Continue current regimen at this time. Recommend lipid panel + liver function enzymes in 6-12 weeks.   Vascular Dementia and Cognitive Impairment: . Uncontrolled; current regimen: memantine 5 mg QPM - dose reduced from BID d/t reports of feeling more tired with BID dosing, though per granddaughter, neurology stopped this medication o Decision to avoid donepezil d/t risk of bradycardia . Contacted Dr. Shah's office. There is no documentation that they stopped memantine. Called patient/Brianna Dodson/granddaughter back. Reviewed that per his office, she is to continue memantine. Granddaughter adamant that it was discontinued. Encouraged to discuss with Dr. Shah at next f/u in June.   Patient Goals/Self-Care Activities . Over the next 90 days, patient will:  - take medications as prescribed check blood pressure daily, document, and provide at future appointments  Follow Up Plan: Telephone follow up appointment with care management team member scheduled for: 4 weeks      Medication Assistance: None required.  Patient affirms current coverage meets needs.  Patient's preferred pharmacy is:  [id number] [place] [place] - [address] [place] [address] Phone: [phone] Fax: [phone]  Uses pill box? Yes Pt endorses 100% compliance  Follow Up:  Patient agrees to Care Plan and Follow-up.  Plan: Telephone follow up appointment with care management team member scheduled for:  ~ 4 weeks  Catie , PharmD, BCACP, CPP Clinical Pharmacist [facility] at  [place] [phone]     

## 2020-11-02 NOTE — Patient Instructions (Addendum)
Brianna Dodson,   It was great talking with you, Brianna Dodson, and Brianna Dodson today.  I'm very proud of you for the improvement in your blood sugar readings! Keep up the great work with your dietary changes.  Please call [facility] to reschedule the missed appointment from 10/27/20. Their phone number is [phone].  Please fill your pill box as below. Of note, this was accurate as of 5/3 when we spoke. Please call me so that I can update your list if any providers change your medications.   Morning: - Eliquis 2.5 mg  - stroke prevention - Carvedilol 6.25 mg - blood pressure - Amlodipine 10 mg - blood pressure - Hydralazine 50 mg - blood pressure - Ezetimibe 10 mg - cholesterol - Tradjenta 5 mg - blood sugars  Evening: - Eliquis 2.5 mg - stroke prevention - Carvedilol 6.25 mg - blood pressure - Hydralazine 50 mg - blood pressure - Atorvastatin 80 mg - cholesterol - Losartan 50 mg - blood pressure - when I spoke with Brianna Dodson on 5/3, it was still on her list. You were to confirm with Dr. Juleen China at dialysis on 5/4 whether he wanted her to continue this or not - Memantine 5 mg - memory - when I spoke with Dr. Trena Platt office on 5/3, it was still on her list. Discuss this with Dr. Manuella Ghazi at the next appointment in June.    Thanks! Catie Darnelle Maffucci, PharmD [phone]  Visit Information  PATIENT GOALS: Goals Addressed              This Visit's Progress     Patient Stated   .  Medication Management (pt-stated)        Patient Goals/Self-Care Activities . Over the next 90 days, patient will:  - take medications as prescribed check blood pressure daily, document, and provide at future appointments       The patient verbalized understanding of instructions, educational materials, and care plan provided today and agreed to receive a mailed copy of patient instructions, educational materials, and care plan.   Plan: Telephone follow up appointment with care management team  member scheduled for:  ~ 4 weeks  Catie Darnelle Maffucci, PharmD, [place], [place] Clinical Pharmacist [employer] at [employer] [phone]

## 2020-11-03 DIAGNOSIS — N2581 Secondary hyperparathyroidism of renal origin: Secondary | ICD-10-CM | POA: Diagnosis not present

## 2020-11-03 DIAGNOSIS — N186 End stage renal disease: Secondary | ICD-10-CM | POA: Diagnosis not present

## 2020-11-03 DIAGNOSIS — D631 Anemia in chronic kidney disease: Secondary | ICD-10-CM | POA: Diagnosis not present

## 2020-11-03 DIAGNOSIS — D509 Iron deficiency anemia, unspecified: Secondary | ICD-10-CM | POA: Diagnosis not present

## 2020-11-03 DIAGNOSIS — Z23 Encounter for immunization: Secondary | ICD-10-CM | POA: Diagnosis not present

## 2020-11-03 DIAGNOSIS — Z992 Dependence on renal dialysis: Secondary | ICD-10-CM | POA: Diagnosis not present

## 2020-11-05 DIAGNOSIS — D509 Iron deficiency anemia, unspecified: Secondary | ICD-10-CM | POA: Diagnosis not present

## 2020-11-05 DIAGNOSIS — N2581 Secondary hyperparathyroidism of renal origin: Secondary | ICD-10-CM | POA: Diagnosis not present

## 2020-11-05 DIAGNOSIS — Z23 Encounter for immunization: Secondary | ICD-10-CM | POA: Diagnosis not present

## 2020-11-05 DIAGNOSIS — Z992 Dependence on renal dialysis: Secondary | ICD-10-CM | POA: Diagnosis not present

## 2020-11-05 DIAGNOSIS — N186 End stage renal disease: Secondary | ICD-10-CM | POA: Diagnosis not present

## 2020-11-05 DIAGNOSIS — D631 Anemia in chronic kidney disease: Secondary | ICD-10-CM | POA: Diagnosis not present

## 2020-11-08 DIAGNOSIS — Z23 Encounter for immunization: Secondary | ICD-10-CM | POA: Diagnosis not present

## 2020-11-08 DIAGNOSIS — Z992 Dependence on renal dialysis: Secondary | ICD-10-CM | POA: Diagnosis not present

## 2020-11-08 DIAGNOSIS — N2581 Secondary hyperparathyroidism of renal origin: Secondary | ICD-10-CM | POA: Diagnosis not present

## 2020-11-08 DIAGNOSIS — N186 End stage renal disease: Secondary | ICD-10-CM | POA: Diagnosis not present

## 2020-11-08 DIAGNOSIS — D509 Iron deficiency anemia, unspecified: Secondary | ICD-10-CM | POA: Diagnosis not present

## 2020-11-08 DIAGNOSIS — D631 Anemia in chronic kidney disease: Secondary | ICD-10-CM | POA: Diagnosis not present

## 2020-11-10 DIAGNOSIS — N186 End stage renal disease: Secondary | ICD-10-CM | POA: Diagnosis not present

## 2020-11-10 DIAGNOSIS — D631 Anemia in chronic kidney disease: Secondary | ICD-10-CM | POA: Diagnosis not present

## 2020-11-10 DIAGNOSIS — Z23 Encounter for immunization: Secondary | ICD-10-CM | POA: Diagnosis not present

## 2020-11-10 DIAGNOSIS — Z992 Dependence on renal dialysis: Secondary | ICD-10-CM | POA: Diagnosis not present

## 2020-11-10 DIAGNOSIS — D509 Iron deficiency anemia, unspecified: Secondary | ICD-10-CM | POA: Diagnosis not present

## 2020-11-10 DIAGNOSIS — N2581 Secondary hyperparathyroidism of renal origin: Secondary | ICD-10-CM | POA: Diagnosis not present

## 2020-11-12 DIAGNOSIS — Z992 Dependence on renal dialysis: Secondary | ICD-10-CM | POA: Diagnosis not present

## 2020-11-12 DIAGNOSIS — Z23 Encounter for immunization: Secondary | ICD-10-CM | POA: Diagnosis not present

## 2020-11-12 DIAGNOSIS — D631 Anemia in chronic kidney disease: Secondary | ICD-10-CM | POA: Diagnosis not present

## 2020-11-12 DIAGNOSIS — D509 Iron deficiency anemia, unspecified: Secondary | ICD-10-CM | POA: Diagnosis not present

## 2020-11-12 DIAGNOSIS — N2581 Secondary hyperparathyroidism of renal origin: Secondary | ICD-10-CM | POA: Diagnosis not present

## 2020-11-12 DIAGNOSIS — N186 End stage renal disease: Secondary | ICD-10-CM | POA: Diagnosis not present

## 2020-11-15 DIAGNOSIS — Z23 Encounter for immunization: Secondary | ICD-10-CM | POA: Diagnosis not present

## 2020-11-15 DIAGNOSIS — N186 End stage renal disease: Secondary | ICD-10-CM | POA: Diagnosis not present

## 2020-11-15 DIAGNOSIS — Z992 Dependence on renal dialysis: Secondary | ICD-10-CM | POA: Diagnosis not present

## 2020-11-15 DIAGNOSIS — N2581 Secondary hyperparathyroidism of renal origin: Secondary | ICD-10-CM | POA: Diagnosis not present

## 2020-11-15 DIAGNOSIS — D509 Iron deficiency anemia, unspecified: Secondary | ICD-10-CM | POA: Diagnosis not present

## 2020-11-15 DIAGNOSIS — D631 Anemia in chronic kidney disease: Secondary | ICD-10-CM | POA: Diagnosis not present

## 2020-11-17 ENCOUNTER — Encounter (INDEPENDENT_AMBULATORY_CARE_PROVIDER_SITE_OTHER): Payer: Medicare Other | Admitting: Ophthalmology

## 2020-11-17 DIAGNOSIS — D631 Anemia in chronic kidney disease: Secondary | ICD-10-CM | POA: Diagnosis not present

## 2020-11-17 DIAGNOSIS — N186 End stage renal disease: Secondary | ICD-10-CM | POA: Diagnosis not present

## 2020-11-17 DIAGNOSIS — D509 Iron deficiency anemia, unspecified: Secondary | ICD-10-CM | POA: Diagnosis not present

## 2020-11-17 DIAGNOSIS — N2581 Secondary hyperparathyroidism of renal origin: Secondary | ICD-10-CM | POA: Diagnosis not present

## 2020-11-17 DIAGNOSIS — Z23 Encounter for immunization: Secondary | ICD-10-CM | POA: Diagnosis not present

## 2020-11-17 DIAGNOSIS — Z992 Dependence on renal dialysis: Secondary | ICD-10-CM | POA: Diagnosis not present

## 2020-11-18 ENCOUNTER — Other Ambulatory Visit: Payer: Self-pay

## 2020-11-18 ENCOUNTER — Encounter (INDEPENDENT_AMBULATORY_CARE_PROVIDER_SITE_OTHER): Payer: Self-pay | Admitting: Nurse Practitioner

## 2020-11-18 ENCOUNTER — Ambulatory Visit (INDEPENDENT_AMBULATORY_CARE_PROVIDER_SITE_OTHER): Payer: Medicare Other

## 2020-11-18 ENCOUNTER — Ambulatory Visit (INDEPENDENT_AMBULATORY_CARE_PROVIDER_SITE_OTHER): Payer: Medicare Other | Admitting: Nurse Practitioner

## 2020-11-18 VITALS — BP 134/65 | HR 66 | Ht 65.0 in | Wt 153.0 lb

## 2020-11-18 DIAGNOSIS — E1122 Type 2 diabetes mellitus with diabetic chronic kidney disease: Secondary | ICD-10-CM | POA: Diagnosis not present

## 2020-11-18 DIAGNOSIS — N183 Chronic kidney disease, stage 3 unspecified: Secondary | ICD-10-CM

## 2020-11-18 DIAGNOSIS — E785 Hyperlipidemia, unspecified: Secondary | ICD-10-CM | POA: Diagnosis not present

## 2020-11-18 DIAGNOSIS — I10 Essential (primary) hypertension: Secondary | ICD-10-CM | POA: Diagnosis not present

## 2020-11-18 DIAGNOSIS — N186 End stage renal disease: Secondary | ICD-10-CM

## 2020-11-18 NOTE — H&P (View-Only) (Signed)
Subjective:    [name], female    DOB: 18-Dec-1933, 87 y.o.   MRN: [id number] Chief Complaint  Patient presents with  . Follow-up    3 wk [facility] post Dialysis/ Perma cath insertion. Vein mapping     The patient is seen for evaluation for dialysis access.  The patient recently began hemodialysis and currently has a right chest PermCath.   The patient has been considering the various methods of dialysis and wishes to proceed with hemodialysis and therefore creation of AV access.  The patient denies amaurosis fugax or recent TIA symptoms. There are no recent neurological changes noted. The patient denies claudication symptoms or rest pain symptoms. The patient denies history of DVT, PE or superficial thrombophlebitis. The patient denies recent episodes of angina or shortness of breath.   Based on noninvasive studies today the patient should have a left brachial axillary AV graft placed.     Review of Systems  Cardiovascular: Negative for leg swelling.  Hematological: Bruises/bleeds easily.  All other systems reviewed and are negative.      Objective:   Physical Exam Vitals reviewed.  HENT:     Head: Normocephalic.  Cardiovascular:     Rate and Rhythm: Normal rate.     Pulses: Normal pulses.          Radial pulses are 2+ on the left side.  Pulmonary:     Effort: Pulmonary effort is normal.  Musculoskeletal:     Right lower leg: No edema.     Left lower leg: No edema.  Neurological:     Mental Status: She is alert and oriented to person, place, and time.  Psychiatric:        Mood and Affect: Mood normal.        Behavior: Behavior normal.        Thought Content: Thought content normal.        Judgment: Judgment normal.     BP 134/65   Pulse 66   Ht 5\' 5"  (1.651 m)   Wt 153 lb (69.4 kg)   BMI 25.46 kg/m   Past Medical History:  Diagnosis Date  . CKD (chronic kidney disease), stage III ([place])   . Diabetes mellitus without complication ([place])   .  History of blood transfusion   . Hyperlipidemia   . Hypertension   . Stroke [facility])     Social History   Socioeconomic History  . Marital status: Married    Spouse name: Not on file  . Number of children: 2  . Years of education: Not on file  . Highest education level: Not on file  Occupational History  . 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 Retired  Tobacco Use  . Smoking status: Never Smoker  . Smokeless tobacco: Never Used  Substance and Sexual Activity  . Alcohol use: No  . Drug use: No  . Sexual activity: Not Currently  Other Topics Concern  . Not on file  Social History Narrative   She is married and has two adult children (adopted).    Hobbies: Decorating   Exercise: None   Caffeine: 4 cups a week   Social Determinants of Health   Financial Resource Strain: Medium Risk  . Difficulty of Paying Living Expenses: Somewhat hard  Food Insecurity: Not on file  Transportation Needs: Not on file  Physical Activity: Not on file  Stress: Not on file  Social Connections: Not on file  Intimate Partner Violence:  Not on file    Past Surgical History:  Procedure Laterality Date  . ABDOMINAL HYSTERECTOMY  1985  . DIALYSIS/PERMA CATHETER INSERTION N/A 10/06/2020   Procedure: DIALYSIS/PERMA CATHETER INSERTION;  Surgeon: Algernon Huxley, MD;  Location: [place] [facility];  Service: Cardiovascular;  Laterality: N/A;  . LOOP RECORDER INSERTION N/A 07/30/2019   Procedure: LOOP RECORDER INSERTION;  Surgeon: Isaias Cowman, MD;  Location: [place] [facility];  Service: Cardiovascular;  Laterality: N/A;  . TEE WITHOUT CARDIOVERSION N/A 04/14/2019   Procedure: TRANSESOPHAGEAL ECHOCARDIOGRAM (TEE);  Surgeon: Teodoro Spray, MD;  Location: [facility];  Service: Cardiovascular;  Laterality: N/A;    Family History  Problem Relation Age of Onset  . Stroke Mother   . Diabetes Mother   . Heart disease Father   . Kidney disease Sister   . Diabetes Sister   .  Kidney disease Brother   . Heart disease Sister   . Diabetes Sister   . Diabetes Sister   . Diabetes Sister   . Kidney disease Sister   . Heart disease Sister   . Kidney disease Brother        kidney transplant  . Early death Brother 73       Truck Accident - died  . Heart disease Brother     Allergies  Allergen Reactions  . Nsaids     CKD stage III - Avoid all nephrotoxic drugs    CBC Latest Ref Rng & Units 09/11/2020 09/10/2020 06/09/2020  WBC 4.0 - 10.5 K/uL 4.5 4.6 4.1  Hemoglobin 12.0 - 15.0 g/dL 7.8(L) 8.3(L) 9.4(L)  Hematocrit 36.0 - 46.0 % 22.7(L) 24.9(L) 27.4(L)  Platelets 150 - 400 K/uL 125(L) 147(L) 161.0      CMP     Component Value Date/Time   NA 139 09/28/2020 1143   NA 142 11/10/2013 0522   K 5.4 (H) 09/28/2020 1143   K 4.6 11/10/2013 0522   CL 107 09/28/2020 1143   CL 110 (H) 11/10/2013 0522   CO2 20 09/28/2020 1143   CO2 27 11/10/2013 0522   GLUCOSE 286 (H) 09/28/2020 1143   GLUCOSE 70 11/10/2013 0522   BUN 41 (H) 09/28/2020 1143   BUN 22 (H) 11/10/2013 0522   CREATININE 4.52 (HH) 09/28/2020 1143   CREATININE 1.12 11/10/2013 0522   CALCIUM 8.7 09/28/2020 1143   CALCIUM 8.4 (L) 11/10/2013 0522   PROT 7.2 06/09/2020 1145   PROT 9.0 (H) 11/09/2013 1540   ALBUMIN 3.9 06/09/2020 1145   ALBUMIN 3.8 11/09/2013 1540   AST 21 06/09/2020 1145   AST 29 11/09/2013 1540   ALT 20 06/09/2020 1145   ALT 27 11/09/2013 1540   ALKPHOS 48 06/09/2020 1145   ALKPHOS 59 11/09/2013 1540   BILITOT 0.6 06/09/2020 1145   BILITOT 0.2 11/09/2013 1540   GFRNONAA 8 (L) 09/13/2020 0218   GFRNONAA 47 (L) 11/10/2013 0522   GFRAA 27 (L) 08/22/2019 0449   GFRAA 54 (L) 11/10/2013 0522     No results found.     Assessment & Plan:   1. ESRD (end stage renal disease) ([place]) Recommend:  At this time the patient does not have appropriate extremity access for dialysis  Patient should have a left brachial axillary AV graft created.  The risks, benefits and alternative  therapies were reviewed in detail with the patient.  All questions were answered.  The patient agrees to proceed with surgery.    2. Essential hypertension Continue antihypertensive medications as already ordered, these  medications have been reviewed and there are no changes at this time.   3. Type 2 diabetes mellitus with stage 3 chronic kidney disease, without long-term current use of insulin, unspecified whether stage 3a or 3b CKD ([place]) Continue hypoglycemic medications as already ordered, these medications have been reviewed and there are no changes at this time.  Hgb A1C to be monitored as already arranged by primary service   4. Hyperlipidemia, unspecified hyperlipidemia type Continue statin as ordered and reviewed, no changes at this time    Current Outpatient Medications on File Prior to Visit  Medication Sig Dispense Refill  . amLODipine (NORVASC) 10 MG tablet Take 1 tablet (10 mg total) by mouth daily. 90 tablet 0  . atorvastatin (LIPITOR) 80 MG tablet Take 1 tablet (80 mg total) by mouth daily. 90 tablet 3  . BESIVANCE 0.6 % SUSP Apply to eye.    . carvedilol (COREG) 6.25 MG tablet TAKE 1 TABLET (6.25 MG TOTAL) BY MOUTH 2 (TWO) TIMES DAILY WITH A MEAL. 180 tablet 1  . ELIQUIS 2.5 MG TABS tablet Take 1 tablet (2.5 mg total) by mouth 2 (two) times daily. 56 tablet 0  . ezetimibe (ZETIA) 10 MG tablet Take 1 tablet (10 mg total) by mouth daily. 90 tablet 3  . Ferrous Sulfate (IRON) 28 MG TABS Take 28 mg by mouth daily at 12 noon.    Marland Kitchen glucose blood (ACCU-CHEK GUIDE) test strip USE TO CHECK BLOOD SUGARS TWICE DAILY. E11.9 100 each 0  . hydrALAZINE (APRESOLINE) 50 MG tablet Take 50 mg by mouth 2 (two) times daily.    . Lancets (ONETOUCH ULTRASOFT) lancets Use as instructed 100 each 6  . linagliptin (TRADJENTA) 5 MG TABS tablet Take 1 tablet (5 mg total) by mouth daily. 28 tablet 0  . losartan (COZAAR) 50 MG tablet Take 50 mg by mouth daily.    . memantine (NAMENDA) 5 MG tablet  Take 5 mg by mouth 2 (two) times daily.    . Vitamin D, Ergocalciferol, (DRISDOL) 50000 units CAPS capsule Take 50,000 Units by mouth every 30 (thirty) days.  1   No current facility-administered medications on file prior to visit.    There are no Patient Instructions on file for this visit. No follow-ups on file.   Kris Hartmann, NP

## 2020-11-18 NOTE — Progress Notes (Signed)
Subjective:    [name], female    DOB: 02/19/1934, 87 y.o.   MRN: [id number] Chief Complaint  Patient presents with  . Follow-up    3 wk [facility] post Dialysis/ Perma cath insertion. Vein mapping     The patient is seen for evaluation for dialysis access.  The patient recently began hemodialysis and currently has a right chest PermCath.   The patient has been considering the various methods of dialysis and wishes to proceed with hemodialysis and therefore creation of AV access.  The patient denies amaurosis fugax or recent TIA symptoms. There are no recent neurological changes noted. The patient denies claudication symptoms or rest pain symptoms. The patient denies history of DVT, PE or superficial thrombophlebitis. The patient denies recent episodes of angina or shortness of breath.   Based on noninvasive studies today the patient should have a left brachial axillary AV graft placed.     Review of Systems  Cardiovascular: Negative for leg swelling.  Hematological: Bruises/bleeds easily.  All other systems reviewed and are negative.      Objective:   Physical Exam Vitals reviewed.  HENT:     Head: Normocephalic.  Cardiovascular:     Rate and Rhythm: Normal rate.     Pulses: Normal pulses.          Radial pulses are 2+ on the left side.  Pulmonary:     Effort: Pulmonary effort is normal.  Musculoskeletal:     Right lower leg: No edema.     Left lower leg: No edema.  Neurological:     Mental Status: She is alert and oriented to person, place, and time.  Psychiatric:        Mood and Affect: Mood normal.        Behavior: Behavior normal.        Thought Content: Thought content normal.        Judgment: Judgment normal.     BP 134/65   Pulse 66   Ht 5\' 5"  (1.651 m)   Wt 153 lb (69.4 kg)   BMI 25.46 kg/m   Past Medical History:  Diagnosis Date  . CKD (chronic kidney disease), stage III ([place])   . Diabetes mellitus without complication ([place])   .  History of blood transfusion   . Hyperlipidemia   . Hypertension   . Stroke [facility])     Social History   Socioeconomic History  . Marital status: Married    Spouse name: Not on file  . Number of children: 2  . Years of education: Not on file  . Highest education level: Not on file  Occupational History  . 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 Retired  Tobacco Use  . Smoking status: Never Smoker  . Smokeless tobacco: Never Used  Substance and Sexual Activity  . Alcohol use: No  . Drug use: No  . Sexual activity: Not Currently  Other Topics Concern  . Not on file  Social History Narrative   She is married and has two adult children (adopted).    Hobbies: Decorating   Exercise: None   Caffeine: 4 cups a week   Social Determinants of Health   Financial Resource Strain: Medium Risk  . Difficulty of Paying Living Expenses: Somewhat hard  Food Insecurity: Not on file  Transportation Needs: Not on file  Physical Activity: Not on file  Stress: Not on file  Social Connections: Not on file  Intimate Partner Violence:  Not on file    Past Surgical History:  Procedure Laterality Date  . ABDOMINAL HYSTERECTOMY  1985  . DIALYSIS/PERMA CATHETER INSERTION N/A 10/06/2020   Procedure: DIALYSIS/PERMA CATHETER INSERTION;  Surgeon: Algernon Huxley, MD;  Location: [place] [facility];  Service: Cardiovascular;  Laterality: N/A;  . LOOP RECORDER INSERTION N/A 07/30/2019   Procedure: LOOP RECORDER INSERTION;  Surgeon: Isaias Cowman, MD;  Location: [place] [facility];  Service: Cardiovascular;  Laterality: N/A;  . TEE WITHOUT CARDIOVERSION N/A 04/14/2019   Procedure: TRANSESOPHAGEAL ECHOCARDIOGRAM (TEE);  Surgeon: Teodoro Spray, MD;  Location: [facility] ORS;  Service: Cardiovascular;  Laterality: N/A;    Family History  Problem Relation Age of Onset  . Stroke Mother   . Diabetes Mother   . Heart disease Father   . Kidney disease Sister   . Diabetes Sister   .  Kidney disease Brother   . Heart disease Sister   . Diabetes Sister   . Diabetes Sister   . Diabetes Sister   . Kidney disease Sister   . Heart disease Sister   . Kidney disease Brother        kidney transplant  . Early death Brother 22       Truck Accident - died  . Heart disease Brother     Allergies  Allergen Reactions  . Nsaids     CKD stage III - Avoid all nephrotoxic drugs    CBC Latest Ref Rng & Units 09/11/2020 09/10/2020 06/09/2020  WBC 4.0 - 10.5 K/uL 4.5 4.6 4.1  Hemoglobin 12.0 - 15.0 g/dL 7.8(L) 8.3(L) 9.4(L)  Hematocrit 36.0 - 46.0 % 22.7(L) 24.9(L) 27.4(L)  Platelets 150 - 400 K/uL 125(L) 147(L) 161.0      CMP     Component Value Date/Time   NA 139 09/28/2020 1143   NA 142 11/10/2013 0522   K 5.4 (H) 09/28/2020 1143   K 4.6 11/10/2013 0522   CL 107 09/28/2020 1143   CL 110 (H) 11/10/2013 0522   CO2 20 09/28/2020 1143   CO2 27 11/10/2013 0522   GLUCOSE 286 (H) 09/28/2020 1143   GLUCOSE 70 11/10/2013 0522   BUN 41 (H) 09/28/2020 1143   BUN 22 (H) 11/10/2013 0522   CREATININE 4.52 (HH) 09/28/2020 1143   CREATININE 1.12 11/10/2013 0522   CALCIUM 8.7 09/28/2020 1143   CALCIUM 8.4 (L) 11/10/2013 0522   PROT 7.2 06/09/2020 1145   PROT 9.0 (H) 11/09/2013 1540   ALBUMIN 3.9 06/09/2020 1145   ALBUMIN 3.8 11/09/2013 1540   AST 21 06/09/2020 1145   AST 29 11/09/2013 1540   ALT 20 06/09/2020 1145   ALT 27 11/09/2013 1540   ALKPHOS 48 06/09/2020 1145   ALKPHOS 59 11/09/2013 1540   BILITOT 0.6 06/09/2020 1145   BILITOT 0.2 11/09/2013 1540   GFRNONAA 8 (L) 09/13/2020 0218   GFRNONAA 47 (L) 11/10/2013 0522   GFRAA 27 (L) 08/22/2019 0449   GFRAA 54 (L) 11/10/2013 0522     No results found.     Assessment & Plan:   1. ESRD (end stage renal disease) ([place]) Recommend:  At this time the patient does not have appropriate extremity access for dialysis  Patient should have a left brachial axillary AV graft created.  The risks, benefits and alternative  therapies were reviewed in detail with the patient.  All questions were answered.  The patient agrees to proceed with surgery.    2. Essential hypertension Continue antihypertensive medications as already ordered, these  medications have been reviewed and there are no changes at this time.   3. Type 2 diabetes mellitus with stage 3 chronic kidney disease, without long-term current use of insulin, unspecified whether stage 3a or 3b CKD ([place]) Continue hypoglycemic medications as already ordered, these medications have been reviewed and there are no changes at this time.  Hgb A1C to be monitored as already arranged by primary service   4. Hyperlipidemia, unspecified hyperlipidemia type Continue statin as ordered and reviewed, no changes at this time    Current Outpatient Medications on File Prior to Visit  Medication Sig Dispense Refill  . amLODipine (NORVASC) 10 MG tablet Take 1 tablet (10 mg total) by mouth daily. 90 tablet 0  . atorvastatin (LIPITOR) 80 MG tablet Take 1 tablet (80 mg total) by mouth daily. 90 tablet 3  . BESIVANCE 0.6 % SUSP Apply to eye.    . carvedilol (COREG) 6.25 MG tablet TAKE 1 TABLET (6.25 MG TOTAL) BY MOUTH 2 (TWO) TIMES DAILY WITH A MEAL. 180 tablet 1  . ELIQUIS 2.5 MG TABS tablet Take 1 tablet (2.5 mg total) by mouth 2 (two) times daily. 56 tablet 0  . ezetimibe (ZETIA) 10 MG tablet Take 1 tablet (10 mg total) by mouth daily. 90 tablet 3  . Ferrous Sulfate (IRON) 28 MG TABS Take 28 mg by mouth daily at 12 noon.    Marland Kitchen glucose blood (ACCU-CHEK GUIDE) test strip USE TO CHECK BLOOD SUGARS TWICE DAILY. E11.9 100 each 0  . hydrALAZINE (APRESOLINE) 50 MG tablet Take 50 mg by mouth 2 (two) times daily.    . Lancets (ONETOUCH ULTRASOFT) lancets Use as instructed 100 each 6  . linagliptin (TRADJENTA) 5 MG TABS tablet Take 1 tablet (5 mg total) by mouth daily. 28 tablet 0  . losartan (COZAAR) 50 MG tablet Take 50 mg by mouth daily.    . memantine (NAMENDA) 5 MG tablet  Take 5 mg by mouth 2 (two) times daily.    . Vitamin D, Ergocalciferol, (DRISDOL) 50000 units CAPS capsule Take 50,000 Units by mouth every 30 (thirty) days.  1   No current facility-administered medications on file prior to visit.    There are no Patient Instructions on file for this visit. No follow-ups on file.   Kris Hartmann, NP

## 2020-11-19 DIAGNOSIS — D509 Iron deficiency anemia, unspecified: Secondary | ICD-10-CM | POA: Diagnosis not present

## 2020-11-19 DIAGNOSIS — N2581 Secondary hyperparathyroidism of renal origin: Secondary | ICD-10-CM | POA: Diagnosis not present

## 2020-11-19 DIAGNOSIS — N186 End stage renal disease: Secondary | ICD-10-CM | POA: Diagnosis not present

## 2020-11-19 DIAGNOSIS — Z23 Encounter for immunization: Secondary | ICD-10-CM | POA: Diagnosis not present

## 2020-11-19 DIAGNOSIS — D631 Anemia in chronic kidney disease: Secondary | ICD-10-CM | POA: Diagnosis not present

## 2020-11-19 DIAGNOSIS — Z992 Dependence on renal dialysis: Secondary | ICD-10-CM | POA: Diagnosis not present

## 2020-11-22 DIAGNOSIS — D631 Anemia in chronic kidney disease: Secondary | ICD-10-CM | POA: Diagnosis not present

## 2020-11-22 DIAGNOSIS — Z23 Encounter for immunization: Secondary | ICD-10-CM | POA: Diagnosis not present

## 2020-11-22 DIAGNOSIS — Z992 Dependence on renal dialysis: Secondary | ICD-10-CM | POA: Diagnosis not present

## 2020-11-22 DIAGNOSIS — D509 Iron deficiency anemia, unspecified: Secondary | ICD-10-CM | POA: Diagnosis not present

## 2020-11-22 DIAGNOSIS — N2581 Secondary hyperparathyroidism of renal origin: Secondary | ICD-10-CM | POA: Diagnosis not present

## 2020-11-22 DIAGNOSIS — N186 End stage renal disease: Secondary | ICD-10-CM | POA: Diagnosis not present

## 2020-11-23 ENCOUNTER — Ambulatory Visit: Payer: Medicare Other | Admitting: Pharmacist

## 2020-11-23 DIAGNOSIS — N185 Chronic kidney disease, stage 5: Secondary | ICD-10-CM

## 2020-11-23 DIAGNOSIS — F0280 Dementia in other diseases classified elsewhere without behavioral disturbance: Secondary | ICD-10-CM

## 2020-11-23 DIAGNOSIS — F0150 Vascular dementia without behavioral disturbance: Secondary | ICD-10-CM

## 2020-11-23 DIAGNOSIS — G309 Alzheimer's disease, unspecified: Secondary | ICD-10-CM

## 2020-11-23 DIAGNOSIS — E785 Hyperlipidemia, unspecified: Secondary | ICD-10-CM

## 2020-11-23 DIAGNOSIS — I480 Paroxysmal atrial fibrillation: Secondary | ICD-10-CM

## 2020-11-23 DIAGNOSIS — E1122 Type 2 diabetes mellitus with diabetic chronic kidney disease: Secondary | ICD-10-CM

## 2020-11-23 NOTE — Chronic Care Management (AMB) (Signed)
Chronic Care Management Pharmacy Note  11/23/2020 Name:  Brianna Dodson MRN:  [id number] DOB:  May 05, 1934  Subjective: Brianna Dodson is an 87 y.o. year old female who is a primary patient of Sonnenberg, Angela Adam, MD.  The CCM team was consulted for assistance with disease management and care coordination needs.    Care coordination for medication access in response to provider referral for pharmacy case management and/or care coordination services.   Consent to Services:  The patient was given information about Chronic Care Management services, agreed to services, and gave verbal consent prior to initiation of services.  Please see initial visit note for detailed documentation.   Patient Care Team: [name], MD as PCP - General (Family Medicine) De Hollingshead, RPH-CPP (Pharmacist)  Recent office visits: None since our last call  Recent consult visits: 5/19 - Vascular - scheduled for left brachial axillary AV graft   [facility]  Medications Discontinued at Hospital Discharge: -Stoppedglimepiridedue to hypoglycemia  Objective:  Lab Results  Component Value Date   CREATININE 4.52 ([place]) 09/28/2020   CREATININE 4.96 (H) 09/13/2020   CREATININE 4.90 (H) 09/12/2020    Lab Results  Component Value Date   HGBA1C 6.8 (H) 09/11/2020   Last diabetic Eye exam:  Lab Results  Component Value Date/Time   HMDIABEYEEXA No Retinopathy 09/15/2014 12:00 AM    Last diabetic Foot exam: No results found for: HMDIABFOOTEX      Component Value Date/Time   CHOL 87 09/11/2020 0223   TRIG 95 09/11/2020 0223   HDL 38 (L) 09/11/2020 0223   CHOLHDL 2.3 09/11/2020 0223   VLDL 19 09/11/2020 0223   LDLCALC 30 09/11/2020 0223   LDLDIRECT 98.0 01/24/2018 0912    Hepatic Function Latest Ref Rng & Units 06/09/2020 11/26/2019 08/20/2019  Total Protein 6.0 - 8.3 g/dL 7.2 6.8 7.1  Albumin 3.5 - 5.2 g/dL 3.9 4.1 3.5  AST 0 - 37 U/L _0 ALT 0 - 35 U/L _1 Alk Phosphatase 39 - 117 U/L 48 46 46  Total Bilirubin 0.2 - 1.2 mg/dL 0.6 0.4 0.9  Bilirubin, Direct 0.0 - 0.3 mg/dL - - -    Lab Results  Component Value Date/Time   TSH 2.55 06/09/2020 11:45 AM   TSH 2.32 04/21/2020 10:04 AM    CBC Latest Ref Rng & Units 09/11/2020 09/10/2020 06/09/2020  WBC 4.0 - 10.5 K/uL 4.5 4.6 4.1  Hemoglobin 12.0 - 15.0 g/dL 7.8(L) 8.3(L) 9.4(L)  Hematocrit 36.0 - 46.0 % 22.7(L) 24.9(L) 27.4(L)  Platelets 150 - 400 K/uL 125(L) 147(L) 161.0    Lab Results  Component Value Date/Time   VD25OH 28.70 (L) 06/09/2020 11:45 AM   VD25OH 41.14 01/06/2016 09:23 AM    Clinical ASCVD: Yes  The ASCVD Risk score Mikey Bussing DC Jr., et al., 2013) failed to calculate for the following reasons:   The 2013 ASCVD risk score is only valid for ages 55 to 71   The patient has a prior MI or stroke diagnosis     Social History   Tobacco Use  Smoking Status Never Smoker  Smokeless Tobacco Never Used   BP Readings from Last 3 Encounters:  11/18/20 134/65  10/06/20 (!) 141/52  09/28/20 138/60   Pulse Readings from Last 3 Encounters:  11/18/20 66  10/06/20 63  09/28/20 61   Wt Readings from Last 3 Encounters:  11/18/20 153 lb (69.4 kg)  10/06/20 171 lb (  77.6 kg)  09/28/20 171 lb 12.8 oz (77.9 kg)    Assessment: Review of patient past medical history, allergies, medications, health status, including review of consultants reports, laboratory and other test data, was performed as part of comprehensive evaluation and provision of chronic care management services.   SDOH:  (Social Determinants of Health) assessments and interventions performed:  SDOH Interventions   Flowsheet Row Most Recent Value  SDOH Interventions   Financial Strain Interventions Intervention Not Indicated      CCM Care Plan  Allergies  Allergen Reactions  . Nsaids     CKD stage III - Avoid all nephrotoxic drugs    Medications Reviewed Today    Reviewed by  Kris Hartmann, NP (Nurse Practitioner) on 11/18/20 at 1059  Med List Status: <None>  Medication Order Taking? Sig Documenting Provider Last Dose Status Informant  amLODipine (NORVASC) 10 MG tablet [id number] Yes Take 1 tablet (10 mg total) by mouth daily. [name], MD Taking Active Multiple Informants           Med Note (SATTERFIELD, Armstead Peaks   Fri Sep 10, 2020  2:32 PM)    atorvastatin (LIPITOR) 80 MG tablet [id number] Yes Take 1 tablet (80 mg total) by mouth daily. [name], MD Taking Active   BESIVANCE 0.6 % SUSP [id number] Yes Apply to eye. [provider] Taking Active   carvedilol (COREG) 6.25 MG tablet [id number] Yes TAKE 1 TABLET (6.25 MG TOTAL) BY MOUTH 2 (TWO) TIMES DAILY WITH A MEAL. [name], MD Taking Active   ELIQUIS 2.5 MG TABS tablet [id number] Yes Take 1 tablet (2.5 mg total) by mouth 2 (two) times daily. [name], MD Taking Active   ezetimibe (ZETIA) 10 MG tablet [id number] Yes Take 1 tablet (10 mg total) by mouth daily. [name], MD Taking Active   Ferrous Sulfate (IRON) 28 MG TABS [id number] Yes Take 28 mg by mouth daily at 12 noon. [provider] Taking Active   glucose blood (ACCU-CHEK GUIDE) test strip [id number] Yes USE TO CHECK BLOOD SUGARS TWICE DAILY. E11.9 [name], MD Taking Active Multiple Informants  hydrALAZINE (APRESOLINE) 50 MG tablet [id number] Yes Take 50 mg by mouth 2 (two) times daily. [provider] Taking Active Multiple Informants  Lancets Glory Rosebush ULTRASOFT) lancets [id number] Yes Use as instructed [name], MD Taking Active Multiple Informants  linagliptin (TRADJENTA) 5 MG TABS tablet [id number] Yes Take 1 tablet (5 mg total) by mouth daily. [name], MD Taking Active   losartan (COZAAR) 50 MG tablet [id number] Yes Take 50 mg by mouth daily. [provider] Taking Active   memantine (NAMENDA) 5 MG tablet [id number] Yes Take 5 mg by mouth 2 (two) times  daily. [provider] Taking Active            Med Note Darnelle Maffucci, Arville Lime   Thu Oct 07, 2020 11:54 AM) Reduced to once daily per neurology   Vitamin D, Ergocalciferol, (DRISDOL) 50000 units CAPS capsule [id number] Yes Take 50,000 Units by mouth every 30 (thirty) days. [provider] Taking Active Multiple Informants           Med Note (SATTERFIELD, Armstead Peaks   Fri Sep 10, 2020  2:38 PM) Patient states she only takes once a month, at the beginning of every month.           Patient Active Problem List   Diagnosis Date Noted  . Malnutrition of moderate degree 09/13/2020  .  TIA (transient ischemic attack) 09/10/2020  . Anemia of chronic disease 09/10/2020  . CVA (cerebral vascular accident) ([place]) 09/10/2020  . Sleeping difficulty 07/08/2020  . Fatigue 06/09/2020  . Mixed Alzheimer's and vascular dementia ([place]) 06/09/2020  . Leukopenia 12/08/2019  . Muscle strain 12/08/2019  . Pedal edema 11/26/2019  . Dysarthria and anarthria 11/07/2019  . Atrial fibrillation ([place]) 10/21/2019  . Nocturia 10/21/2019  . Seborrheic keratosis 10/21/2019  . Carotid stenosis 08/27/2019  . Thyroid nodule 08/27/2019  . Status post placement of implantable loop recorder 08/07/2019  . Anemia 04/18/2019  . Aphasia 04/12/2019  . Benign hypertensive renal disease 04/07/2019  . Hyperparathyroidism due to renal insufficiency ([place]) 04/07/2019  . 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 Proteinuria 04/07/2019  . History of stroke with current residual effects 03/15/2019  . Hyperlipidemia associated with type 2 diabetes mellitus ([place]) 07/25/2018  . Itching 10/23/2017  . Nevus 04/17/2017  . CKD (chronic kidney disease), stage V ([place]) 02/02/2015  . Vitamin D deficiency 02/02/2015  . Obesity (BMI 30-39.9) 08/03/2014  . DM type 2 (diabetes mellitus, type 2) ([place]) 01/29/2014  . Essential hypertension 01/13/2014  .  HLD (hyperlipidemia) 01/13/2014    Immunization History  Administered Date(s) Administered  . PFIZER Comirnaty(Gray Top)Covid-19 Tri-Sucrose Vaccine 10/08/2020  . PFIZER(Purple Top)SARS-COV-2 Vaccination 07/25/2019, 08/15/2019    Conditions to be addressed/monitored: CAD, HTN, HLD, DMII and ESRD  Care Plan : Medication Management  Updates made by De Hollingshead, RPH-CPP since 11/23/2020 12:00 AM    Problem: S/p CVA, vascular dementia, CKD, T2DM     Long-Range Goal: Medication Management   Start Date: 09/09/2020  This Visit's Progress: On track  Recent Progress: On track  Priority: High  Note:   Current Barriers:  . Unable to independently afford treatment regimen . Unable to self administer medications as prescribed  Pharmacist Clinical Goal(s):  Marland Kitchen Over the next 90 days,patient will verbalize ability to afford treatment regimen . Over the next 90 days, adhere to prescribed medication regimen through collaboration with PharmD and provider.   Interventions: . 1:1 collaboration with [name], MD regarding development and update of comprehensive plan of care as evidenced by provider attestation and co-signature . Inter-disciplinary care team collaboration (see longitudinal plan of care) . Comprehensive medication review performed; medication list updated in electronic medical record  SDOH: . Patient lives at home. Aphasia s/p multiple strokes. Family helps with care. Granddaughter, Rayna Sexton, helps with medication review today. Granddaughter is not on DPR. Sister, [name], is on [place]. Also has an aid, [name], that is in helping with care, medication management . Once regimen stable, will consider adherence packaging from [facility]. For now, pill box should be filled:  o AM: Eliquis, carvedilol, amlodipine, hydralazine, ezetimibe, Tradjenta o PM: Eliquis, carvedilol, hydralazine, atorvastatin, losartan, memantine  Diabetes: . Uncontrolled, but more relaxed goal  of <8% likely appropriate given comorbidities; current treatment: Tradjenta 5 mg daily . [name] called today requesting samples for Tradjenta. Will provide today, but will discuss long term affordability at next appointment.   Hypertension and Atrial Fibrillation with advanced CKD: . Uncontrolled; current treatment: amlodipine 10 mg daily, carvedilol 6.25 mg BID, hydralazine 50 mg BID, losartan 50 mg - though had not been taking at last appointment, was to discuss w/ Dr. Juleen China at next dialysis session . MWF dialysis 11-4 pm . Anticoagulant regimen: Eliquis 2.5 mg BID - now affordable w/ $10/month copay card  .  Unclear per documentation whether losartan was held or continued. Will discuss with patient/caregivers at next call  Hyperlipidemia and secondary ASCVD prevention . Controlled per last lipid panel; current treatment: ezetimibe 10 mg, atorvastatin 80 mg daily  . Previous medications: rosuvastatin (d/c d/t renal function) . Antiplatelet regimen: none given concurrent DOAC . Previously recommended to continue current regimen at this time. Recommend lipid panel + liver function enzymes in 6-12 weeks.   Vascular Dementia and Cognitive Impairment: . Uncontrolled; current regimen: memantine 5 mg QPM - dose reduced from BID d/t reports of feeling more tired with BID dosing, though per granddaughter, neurology stopped this medication o Decision to avoid donepezil d/t risk of bradycardia . Encouraged to discuss w/ Dr. Manuella Ghazi at next appointment in June.   ESRD on HD: . Managed at dialysis by nephrology; current regimen: Mircera 75 mcg Every 4 weeks. Iron Sucrose (Venofer) 100 mg Every Treatment . Continue current regimen along with collaboration with nephrology at this time   Patient Goals/Self-Care Activities . Over the next 90 days, patient will:  - take medications as prescribed check blood pressure daily, document, and provide at future appointments  Follow Up Plan: Telephone follow  up appointment with care management team member scheduled for: 2 weeks      Medication Assistance: None required.  Patient affirms current coverage meets needs.  [facility]  [id number] [place] [id number]: [phone]: [phone]  Follow Up:  Patient agrees to Care Plan and Follow-up.  Plan: Telephone follow up appointment with care management team member scheduled for:  ~ 2 weeks as previously scheduled  Catie TDarnelle Maffucci PharmD, BOverlea CGreen OaksClinical Pharmacist LOccidental Petroleumat [facility]First Surgical Hospital - [phone]

## 2020-11-23 NOTE — Patient Instructions (Signed)
Visit Information  PATIENT GOALS: Goals Addressed              This Visit's Progress     Patient Stated   .  Medication Management (pt-stated)        Patient Goals/Self-Care Activities . Over the next 90 days, patient will:  - take medications as prescribed check blood pressure daily, document, and provide at future appointments        Patient verbalizes understanding of instructions provided today and agrees to view in [place].    Plan: Telephone follow up appointment with care management team member scheduled for:  ~ 2 weeks as previously scheduled  Catie Darnelle Maffucci, PharmD, [place], [place] Clinical Pharmacist [employer] at [facility] [phone]

## 2020-11-24 ENCOUNTER — Telehealth (INDEPENDENT_AMBULATORY_CARE_PROVIDER_SITE_OTHER): Payer: Self-pay

## 2020-11-24 DIAGNOSIS — D509 Iron deficiency anemia, unspecified: Secondary | ICD-10-CM | POA: Diagnosis not present

## 2020-11-24 DIAGNOSIS — Z23 Encounter for immunization: Secondary | ICD-10-CM | POA: Diagnosis not present

## 2020-11-24 DIAGNOSIS — Z992 Dependence on renal dialysis: Secondary | ICD-10-CM | POA: Diagnosis not present

## 2020-11-24 DIAGNOSIS — N186 End stage renal disease: Secondary | ICD-10-CM | POA: Diagnosis not present

## 2020-11-24 DIAGNOSIS — D631 Anemia in chronic kidney disease: Secondary | ICD-10-CM | POA: Diagnosis not present

## 2020-11-24 DIAGNOSIS — N2581 Secondary hyperparathyroidism of renal origin: Secondary | ICD-10-CM | POA: Diagnosis not present

## 2020-11-24 NOTE — Telephone Encounter (Signed)
I spoke with the patient's sister [name] on 11/18/20 and it was agreed to do her surgery on 12/02/20. Patient has been scheduled with Dr. [name] for a Left brachial axillary graft at [facility]. Pre-op is a phone call on 11/26/20 between 1-5 pm. Pre-surgical instructions will be mailed.

## 2020-11-25 ENCOUNTER — Other Ambulatory Visit (INDEPENDENT_AMBULATORY_CARE_PROVIDER_SITE_OTHER): Payer: Self-pay | Admitting: Nurse Practitioner

## 2020-11-26 ENCOUNTER — Other Ambulatory Visit: Payer: Self-pay

## 2020-11-26 ENCOUNTER — Encounter
Admission: RE | Admit: 2020-11-26 | Discharge: 2020-11-26 | Disposition: A | Discharge status: Home / Self Care | Payer: Medicare Other | Source: Ambulatory Visit | Attending: Vascular Surgery | Admitting: Vascular Surgery

## 2020-11-26 DIAGNOSIS — Z23 Encounter for immunization: Secondary | ICD-10-CM | POA: Diagnosis not present

## 2020-11-26 DIAGNOSIS — Z992 Dependence on renal dialysis: Secondary | ICD-10-CM | POA: Diagnosis not present

## 2020-11-26 DIAGNOSIS — N2581 Secondary hyperparathyroidism of renal origin: Secondary | ICD-10-CM | POA: Diagnosis not present

## 2020-11-26 DIAGNOSIS — N186 End stage renal disease: Secondary | ICD-10-CM | POA: Diagnosis not present

## 2020-11-26 DIAGNOSIS — D631 Anemia in chronic kidney disease: Secondary | ICD-10-CM | POA: Diagnosis not present

## 2020-11-26 DIAGNOSIS — D509 Iron deficiency anemia, unspecified: Secondary | ICD-10-CM | POA: Diagnosis not present

## 2020-11-26 HISTORY — DX: Occlusion and stenosis of unspecified carotid artery: I65.29

## 2020-11-26 HISTORY — DX: Dementia in other diseases classified elsewhere, unspecified severity, without behavioral disturbance, psychotic disturbance, mood disturbance, and anxiety: F02.80

## 2020-11-26 HISTORY — DX: Anemia, unspecified: D64.9

## 2020-11-26 HISTORY — DX: Inflammatory liver disease, unspecified: K75.9

## 2020-11-26 HISTORY — DX: Alzheimer's disease, unspecified: G30.9

## 2020-11-26 HISTORY — DX: Unspecified atrial fibrillation: I48.91

## 2020-11-26 NOTE — Patient Instructions (Signed)
Your procedure is scheduled on:12-02-20 THURSDAY Report to the Registration Desk on the 1st floor of the [facility]-Then proceed to the 2nd floor Surgery Desk in the [place] To find out your arrival time, please call [phone] between 1PM - 3PM on:12-01-20 WEDNESDAY  REMEMBER: Instructions that are not followed completely may result in serious medical risk, up to and including death; or upon the discretion of your surgeon and anesthesiologist your surgery may need to be rescheduled.  Do not eat food after midnight the night before surgery.  No gum chewing, lozengers or hard candies.  You may however, drink WATER up to 2 hours before you are scheduled to arrive for your surgery. Do not drink anything within 2 hours of your scheduled arrival time.  Type 1 and Type 2 diabetics should only drink water.  TAKE THESE MEDICATIONS THE MORNING OF SURGERY WITH A SIP OF WATER:     LAST DOSE OF ELIQUIS WILL BE ON 11-29-20 MONDAY  One week prior to surgery: Stop Anti-inflammatories (NSAIDS) such as Advil, Aleve, Ibuprofen, Motrin, Naproxen, Naprosyn and Aspirin based products such as Excedrin, Goodys Powder, BC Powder.You may however, continue to take Tylenol if needed for pain up until the day of surgery.  Stop ANY OVER THE COUNTER supplements/vitamins NOW (11-26-20) until after surgery-However, Continue your ferrous sulfate (iron pill) up until the day prior to surgery  No Alcohol for 24 hours before or after surgery.  No Smoking including e-cigarettes for 24 hours prior to surgery.  No chewable tobacco products for at least 6 hours prior to surgery.  No nicotine patches on the day of surgery.  Do not use any "recreational" drugs for at least a week prior to your surgery.  Please be advised that the combination of cocaine and anesthesia may have negative outcomes, up to and including death. If you test positive for cocaine, your surgery will be cancelled.  On the morning of surgery  brush your teeth with toothpaste and water, you may rinse your mouth with mouthwash if you wish. Do not swallow any toothpaste or mouthwash.  Do not wear jewelry, make-up, hairpins, clips or nail polish.  Do not wear lotions, powders, or perfumes.   Do not shave body from the neck down 48 hours prior to surgery just in case you cut yourself which could leave a site for infection.  Also, freshly shaved skin may become irritated if using the CHG soap.  Contact lenses, hearing aids and dentures may not be worn into surgery.  Do not bring valuables to the hospital. [facility] is not responsible for any missing/lost belongings or valuables.   Use CHG wipes as directed on instruction sheet.  Notify your doctor if there is any change in your medical condition (cold, fever, infection).  Wear comfortable clothing (specific to your surgery type) to the hospital.  Plan for stool softeners for home use; pain medications have a tendency to cause constipation. You can also help prevent constipation by eating foods high in fiber such as fruits and vegetables and drinking plenty of fluids as your diet allows.  After surgery, you can help prevent lung complications by doing breathing exercises.  Take deep breaths and cough every 1-2 hours. Your doctor may order a device called an Incentive Spirometer to help you take deep breaths. When coughing or sneezing, hold a pillow firmly against your incision with both hands. This is called "splinting." Doing this helps protect your incision. It also decreases belly discomfort.  If you  are being admitted to the hospital overnight, leave your suitcase in the car. After surgery it may be brought to your room.  If you are being discharged the day of surgery, you will not be allowed to drive home. You will need a responsible adult (18 years or older) to drive you home and stay with you that night.   If you are taking public transportation, you will need to have a  responsible adult (18 years or older) with you. Please confirm with your physician that it is acceptable to use public transportation.   Please call the [place] [facility] at [phone] if you have any questions about these instructions.  Surgery Visitation Policy:  Patients undergoing a surgery or procedure may have one family member or support person with them as long as that person is not COVID-19 positive or experiencing its symptoms.  That person may remain in the waiting area during the procedure.  Inpatient Visitation:    Visiting hours are 7 a.m. to 8 p.m. Inpatients will be allowed two visitors daily. The visitors may change each day during the patient's stay. No visitors under the age of 52. Any visitor under the age of 70 must be accompanied by an adult. The visitor must pass COVID-19 screenings, use hand sanitizer when entering and exiting the patient's room and wear a mask at all times, including in the patient's room. Patients must also wear a mask when staff or their visitor are in the room. Masking is required regardless of vaccination status.  Total Hip/Knee Replacement Preoperative Educational Video  To better prepare for surgery, please view our videos that explain the physical activity and discharge planning required to have the best surgical recovery at [facility].  [email or web address]      Questions? Call [phone] or email [email or web address]

## 2020-11-28 ENCOUNTER — Encounter: Payer: Self-pay | Admitting: Vascular Surgery

## 2020-11-28 NOTE — Progress Notes (Signed)
Perioperative Services  Pre-Admission/Anesthesia Testing Clinical Review  Date: 12/01/20  Patient Demographics:  Name: Brianna Dodson DOB:   10-Jun-1934 MRN:   [id number]  Planned Surgical Procedure(s):    Case: [id number] Date/Time: 12/02/20 1230   Procedure: INSERTION OF ARTERIOVENOUS (AV) GORE-TEX GRAFT ARM ( BRACHIAL AXILLARY ) (Left )   Anesthesia type: General   Pre-op diagnosis: ESRD   Location: [facility] OR ROOM 03 / [place] [facility]S FOR ANESTHESIA GROUP   Surgeons: Algernon Huxley, MD    NOTE: Available PAT nursing documentation and vital signs have been reviewed. Clinical nursing staff has updated patient's PMH/PSHx, current medication list, and drug allergies/intolerances to ensure comprehensive history available to assist in medical decision making as it pertains to the aforementioned surgical procedure and anticipated anesthetic course. Extensive review of available clinical information performed. [place] PMH and PSHx updated with any diagnoses/procedures that  may have been inadvertently omitted during her intake with the pre-admission testing department's nursing staff.  Clinical Discussion:  Brianna Dodson is a 87 y.o. female who is submitted for pre-surgical anesthesia review and clearance prior to her undergoing the above procedure. Patient has never been a smoker. Pertinent PMH includes: atrial fibrillation, multiple CVA, carotid stenosis, HTN, HLD, T2DM, CKD-IV, OSA (no nocturnal PAP therapy), HCV, anemia, Alzheimer's disease  Patient is followed by cardiology Brianna Pilar, MD). She was last seen in the cardiology clinic on 09/02/2020; notes reviewed.  At the time of her clinic visit, patient doing fairly well from a cardiovascular perspective.  She denied any chest pain, however complained of mild exertional dyspnea that was chronic in nature and reported to be stable.  No PND, orthopnea, palpitations, significant peripheral edema, vertiginous symptoms, or presyncope/syncope.  She reported  fatigue.  Patient with a significant cardiovascular history.    MRI imaging of the brain performed on 03/15/2019 revealed scattered patchy acute infarcts in the RIGHT MCA and PCA territories with no associated hemorrhage or mass-effect.   Carotid Doppler studies performed on 03/16/2019 revealed BILATERAL carotid atherosclerosis with no hemodynamically significant ICA stenosis.  Degree of narrowing felt to be <50% by ultrasound criteria.  There was antegrade vertebral flow noted bilaterally on exam.   MRI imaging of the brain performed on 04/12/2019 revealed multiple new foci of acute infarction affecting RIGHT hemisphere; frontal, posterior frontal, posterior temporal, anterior parietal cortex, and regional white matter.  Radiologist noted that linear distribution of the lesions raise question for possible watershed type hypoperfusion insult versus a shower of emboli.  Patient also went MRA imaging of the neck that revealed no flow reducing lesions within the neck.   MRI imaging of the brain performed on 08/20/2019 revealed acute to subacute recurrent RIGHT MCA territory infarct with no associated hemorrhage or mass-effect.     Repeat carotid Doppler study revealed moderate to large amount of LEFT-sided atherosclerotic plaque resulting in elevated peak velocities within the ICA compatible with a 50-69% luminal narrowing.  There was a moderate amount of RIGHT-sided atherosclerotic plaque not resulting in a hemodynamically significant stenosis.    Subsequent MRA imaging performed on 08/21/2019 revealed a large vessel occlusion of the RIGHT MCA distal M1.  Radiologist noted that the RIGHT anterior temporal artery remained patent, however there was otherwise no significant collaterals.  There was severe RIGHT ACA A2 stenosis also noted on exam.   Repeat MRI of the brain performed on 09/12/2020 revealed acute ischemia along the anterior and posterior margins of the RIGHT MCA infarct that occurred  last month with no associated hemorrhage or  mass-effect.  There was developing encephalomalacia and laminar necrosis noted radiologist made mention of fairly advanced chronic small vessel disease (see full interpretation of cardiovascular testing below).  Patient with a history of atrial fibrillation. CHA2DS2-VASc Score = 7 (age x 2, sex, HTN, previous CVA x 2, T2DM). Patient chronically anticoagulated using apixaban; compliant with therapy with no evidence of GI bleeding. Patient on GDMT for her HTN and HLD diagnoses.  Blood pressure was elevated at 158/68 despite currently prescribed CCB, beta-blocker, and ARB therapies.  Patient is on a statin for her HLD. T2DM well controlled on currently prescribed regimen; Hgb A1c 6.8% when last checked on 09/11/2020. Functional capacity, as defined by DASI, is documented as being >/= 4 METS.  No changes were made to patient's medication regimen.  Patient to follow-up with outpatient cardiology in 4 months or sooner if needed.  Patient is currently on hemodialysis which she receives treatments on Mondays, Wednesdays, and Fridays.  She currently receives her treatments through [facility]. She is scheduled for AV graft placement on 12/02/2020 with Dr. Leotis Pain.  Given patient's past medical history significant for cardiovascular diagnoses, presurgical cardiac clearance was sought by the PAT team. Per cardiology, "this patient is optimized for surgery and may proceed with the planned procedural course with a LOW risk stratification".  This patient is on daily anticoagulation therapy. She has been instructed on recommendations for holding her apixaban for 3 days prior to her procedure with plans to restart as soon as postoperative bleeding risk felt to be minimized by her attending surgeon. The patient has been instructed that her last dose of her anticoagulant will be on 11/29/2020.  Patient denies previous perioperative complications with anesthesia in the past. In review  of the patient's EMR, there are no records available for review regarding patient's past surgical/anesthetic courses within the [facility] system.  Vitals with BMI 11/18/2020 10/06/2020 10/06/2020  Height 5\' 5"  - -  Weight 153 lbs - -  BMI 94.85 - -  Systolic 462 703 500  Diastolic 65 52 52  Pulse 66 63 -    Providers/Specialists:   NOTE: Primary physician provider listed below. Patient may have been seen by APP or partner within same practice.   PROVIDER ROLE / SPECIALTY LAST Imelda Pillow, MD  Vascular Surgery  11/18/2020  [name], MD  Primary Care Provider  09/28/2020  Isaias Cowman, MD  Cardiology  09/02/2020  Lavonia Dana, MD  Nephrology  10/06/2020  Jennings Books, MD  Neurology  10/05/2020   Allergies:  Nsaids  Current Home Medications:   No current facility-administered medications for this encounter.   Marland Kitchen amLODipine (NORVASC) 10 MG tablet  . atorvastatin (LIPITOR) 80 MG tablet  . BESIVANCE 0.6 % SUSP  . carvedilol (COREG) 6.25 MG tablet  . ELIQUIS 2.5 MG TABS tablet  . ezetimibe (ZETIA) 10 MG tablet  . Ferrous Sulfate (IRON) 28 MG TABS  . glucose blood (ACCU-CHEK GUIDE) test strip  . hydrALAZINE (APRESOLINE) 50 MG tablet  . Lancets (ONETOUCH ULTRASOFT) lancets  . linagliptin (TRADJENTA) 5 MG TABS tablet  . losartan (COZAAR) 50 MG tablet  . memantine (NAMENDA) 5 MG tablet  . Vitamin D, Ergocalciferol, (DRISDOL) 50000 units CAPS capsule   History:   Past Medical History:  Diagnosis Date  . A-fib ([place])   . Alzheimer disease ([place])   . Anemia   . Carotid stenosis   . Cerebral infarction ([place]) 04/12/2019   Multiple new foci of acute infarction affect  the RIGHT hemisphere affecting the frontal, posterior frontal, posterior temporal, anterior parietal cortex and regional white matter.  . Cerebral infarction due to embolism of right middle cerebral artery ([place]) 08/20/2019   RIGHT MCA distal M1  . Chronic anticoagulation    Apixaban  . CKD  (chronic kidney disease), stage 4(HCC)   . Embolic stroke involving cerebral artery ([place]) 03/15/2019   RIGHT MCA/PCA territory  . Encephalomalacia    RIGHT posterior MCA territory  . HCV (hepatitis C virus)   . History of blood transfusion   . Hyperlipidemia   . Hypertension   . OSA (obstructive sleep apnea)   . Status post placement of implantable loop recorder 07/30/2019  . T2DM (type 2 diabetes mellitus) ([place])    Past Surgical History:  Procedure Laterality Date  . ABDOMINAL HYSTERECTOMY  1985  . DIALYSIS/PERMA CATHETER INSERTION N/A 10/06/2020   Procedure: DIALYSIS/PERMA CATHETER INSERTION;  Surgeon: Algernon Huxley, MD;  Location: [place] CV LAB;  Service: Cardiovascular;  Laterality: N/A;  . LOOP RECORDER INSERTION N/A 07/30/2019   Procedure: LOOP RECORDER INSERTION;  Surgeon: Isaias Cowman, MD;  Location: [place] [facility];  Service: Cardiovascular;  Laterality: N/A;  . TEE WITHOUT CARDIOVERSION N/A 04/14/2019   Procedure: TRANSESOPHAGEAL ECHOCARDIOGRAM (TEE);  Surgeon: Teodoro Spray, MD;  Location: [facility] ORS;  Service: Cardiovascular;  Laterality: N/A;   Family History  Problem Relation Age of Onset  . Stroke Mother   . Diabetes Mother   . Heart disease Father   . Kidney disease Sister   . Diabetes Sister   . Kidney disease Brother   . Heart disease Sister   . Diabetes Sister   . Diabetes Sister   . Diabetes Sister   . Kidney disease Sister   . Heart disease Sister   . Kidney disease Brother        kidney transplant  . Early death Brother 36       Truck Accident - died  . Heart disease Brother    Social History   Tobacco Use  . Smoking status: Never Smoker  . Smokeless tobacco: Never Used  Vaping Use  . Vaping Use: Never used  Substance Use Topics  . Alcohol use: No  . Drug use: No    Pertinent Clinical Results:  LABS: Labs reviewed: Acceptable for surgery.  Hospital Outpatient Visit on 11/30/2020  Component Date Value Ref Range Status  .  WBC 11/30/2020 3.1* 4.0 - 10.5 K/uL Final  . RBC 11/30/2020 3.38* 3.87 - 5.11 MIL/uL Final  . Hemoglobin 11/30/2020 10.1* 12.0 - 15.0 g/dL Final  . HCT 11/30/2020 30.4* 36.0 - 46.0 % Final  . MCV 11/30/2020 89.9  80.0 - 100.0 fL Final  . MCH 11/30/2020 29.9  26.0 - 34.0 pg Final  . MCHC 11/30/2020 33.2  30.0 - 36.0 g/dL Final  . RDW 11/30/2020 12.5  11.5 - 15.5 % Final  . Platelets 11/30/2020 135* 150 - 400 K/uL Final  . nRBC 11/30/2020 0.0  0.0 - 0.2 % Final  . Neutrophils Relative % 11/30/2020 57  % Final  . Neutro Abs 11/30/2020 1.8  1.7 - 7.7 K/uL Final  . Lymphocytes Relative 11/30/2020 25  % Final  . Lymphs Abs 11/30/2020 0.8  0.7 - 4.0 K/uL Final  . Monocytes Relative 11/30/2020 14  % Final  . Monocytes Absolute 11/30/2020 0.4  0.1 - 1.0 K/uL Final  . Eosinophils Relative 11/30/2020 3  % Final  . Eosinophils Absolute 11/30/2020 0.1  0.0 -  0.5 K/uL Final  . Basophils Relative 11/30/2020 1  % Final  . Basophils Absolute 11/30/2020 0.0  0.0 - 0.1 K/uL Final  . Immature Granulocytes 11/30/2020 0  % Final  . Abs Immature Granulocytes 11/30/2020 0.01  0.00 - 0.07 K/uL Final   Performed at [facility], [address], [place], [place] [address]  . Sodium 11/30/2020 138  135 - 145 mmol/L Final  . Potassium 11/30/2020 3.4* 3.5 - 5.1 mmol/L Final  . Chloride 11/30/2020 95* 98 - 111 mmol/L Final  . CO2 11/30/2020 32  22 - 32 mmol/L Final  . Glucose, Bld 11/30/2020 271* 70 - 99 mg/dL Final   Glucose reference range applies only to samples taken after fasting for at least 8 hours.  . BUN 11/30/2020 13  8 - 23 mg/dL Final  . Creatinine, Ser 11/30/2020 2.93* 0.44 - 1.00 mg/dL Final  . Calcium 11/30/2020 8.6* 8.9 - 10.3 mg/dL Final  . GFR, Estimated 11/30/2020 15* >60 mL/min Final   Comment: (NOTE) Calculated using the CKD-EPI Creatinine Equation (2021)   . Anion gap 11/30/2020 11  5 - 15 Final   Performed at [facility], [address] [place], [place] [address]   . Prothrombin Time 11/30/2020 14.8  11.4 - 15.2 seconds Final  . INR 11/30/2020 1.2  0.8 - 1.2 Final   Comment: (NOTE) INR goal varies based on device and disease states. Performed at [facility], [address], [place], [place] [address]   . aPTT 11/30/2020 33  24 - 36 seconds Final   Performed at [facility], [address] [place], [place] [address]  . ABO/RH(D) 11/30/2020 A POS   Final  . Antibody Screen 11/30/2020 NEG   Final  . Sample Expiration 11/30/2020 12/14/2020,2359   Final  . Extend sample reason 11/30/2020    Final                   Value:NO TRANSFUSIONS OR PREGNANCY IN THE PAST 3 MONTHS Performed at [facility], [address] [place], [place] [address]     ECG: Date: 12/01/2020 Time ECG obtained: 1318 PM Rate: 71 bpm Axis: Left axis deviation Rhythm:  Normal sinus rhythm, LAFB Intervals: PR 200 ms. QRS 102 ms. QTc 475 ms. ST segment and T wave changes: No evidence of acute ST segment elevation or depression; evidence of an age undetermined anterior infarct present; LAFB now present.  Comparison: Similar to previous tracing obtained on 09/10/2020.   IMAGING / PROCEDURES: BILATERAL CAROTID DOPPLER performed on 09/11/2020 1. Right Carotid:   Velocities in the right ICA are consistent with a 1-39% stenosis.  2. Left Carotid:   Velocities in the left ICA are consistent with a 1-39% stenosis.  3. Vertebrals:    Right vertebral artery demonstrates antegrade flow.   Left vertebral artery was not visualized. 4. Subclavians:   Normal flow hemodynamics were seen in bilateral subclavian arteries.  TRANSTHORACIC ECHOCARDIOGRAM performed on 09/11/2020 1. Left ventricular ejection fraction, by estimation, is 60 to 65%. The left ventricle has normal function. The left ventricle has no regional wall motion abnormalities. There is moderate left ventricular hypertrophy. Left ventricular diastolic parameters are consistent with Grade I  diastolic dysfunction (impaired relaxation).  2. Right ventricular systolic function is normal. The right ventricular size is normal. Tricuspid regurgitation signal is inadequate for assessing PA pressure.  3. The mitral valve is normal in structure. Trivial mitral valve regurgitation. No evidence of mitral stenosis.  4.  The aortic valve is normal in structure. There is mild calcification of the aortic valve. Aortic valve regurgitation is not visualized. No aortic stenosis is present.  5. The inferior vena cava is normal in size with <50% respiratory variability, suggesting right atrial pressure of 8 mmHg.  MRI BRAIN WITHOUT CONTRAST performed on 09/12/2020 1. Truncated exam. 2. Acute ischemia is present along the anterior - and especially posterior - margins of the Right MCA infarct that occurred last month. 3. No malignant hemorrhagic transformation or mass effect. 4. Expected developing encephalomalacia and laminar necrosis in the parenchyma affected last month. 5. Underlying fairly advanced chronic small vessel disease.  MRA HEAD WITHOUT CONTRAST performed on 08/21/2019 1. Positive for large vessel occlusion of the Right MCA distal M1. 2. The right anterior temporal artery remains patent but otherwise no significant collaterals are detected by MRA. 3. Generally mild intracranial stenosis related to atherosclerosis elsewhere, although a severe Right ACA A2 stenosis is also noted. 4. Infundibulum of the right ophthalmic artery origin suspected (normal variant).  MRI BRAIN WITHOUT CONTRAST performed on 08/20/2019 1. Acute to subacute recurrent right MCA territory infarct,confluent in the operculum. No associated hemorrhage or mass effect. 2. Elsewhere stable underlying chronic ischemic and small vessel disease since October.  TRANSESOPHAGEAL ECHOCARDIOGRAM performed on 04/14/2019 1. Left ventricular ejection fraction, by visual estimation, is 60 to 65%. The left ventricle has normal  function. Left ventricular septal wall thickness was normal. Normal left ventricular posterior wall thickness. There is no left ventricular hypertrophy.  2. Global right ventricle has normal systolic function.The right ventricular size is normal. No increase in right ventricular wall thickness.  3. Left atrial size was normal.  4. Right atrial size was normal.  5. The mitral valve is grossly normal. Trace mitral valve regurgitation.  6. The tricuspid valve is grossly normal. Tricuspid valve regurgitation is trivial.  7. The aortic valve is tricuspid Aortic valve regurgitation is mild by color flow Doppler. Structurally normal aortic valve, with no evidence of sclerosis or stenosis.  8. The pulmonic valve was not well visualized. Pulmonic valve regurgitation is trivial by color flow Doppler.  9. The aortic root was not well visualized.   MRI BRAIN and MRA NECK without contrast performed on 04/12/2019 1. Multiple new foci of acute infarction affect the RIGHT hemisphere affecting the frontal, posterior frontal, posterior temporal, anterior parietal cortex and regional white matter. The linear distribution of the lesions raises the question of watershed type hypoperfusion insult, versus a shower of emboli. 2. Moderate atrophy with extensive small vessel disease. 3. No extracranial flow reducing lesion is evident, within limits for detection on noncontrast MRA neck.   MRI BRAIN WITHOUT CONTRAST performed on 03/15/2019 1. Scattered, patchy acute infarcts in the right MCA territory white matter and the right PCA territory. As it appears there is a fetal type right PCA origin (normal vascular variant), this might reflect a recent embolic event from the right carotid. 2. No associated hemorrhage or mass effect. 3. Underlying moderately advanced signal changes suggesting chronic small vessel disease, including chronic microhemorrhages in the thalami and right occipital pole.  Impression and Plan:  Brianna Dodson has been referred for pre-anesthesia review and clearance prior to her undergoing the planned anesthetic and procedural courses. Available labs, pertinent testing, and imaging results were personally reviewed by me. This patient has been appropriately cleared by cardiology with an overall LOW risk of significant perioperative cardiovascular complications.  Based on clinical review performed today (12/01/20), barring any significant acute  changes in the patient's overall condition, it is anticipated that she will be able to proceed with the planned surgical intervention. Any acute changes in clinical condition may necessitate her procedure being postponed and/or cancelled. Patient will meet with anesthesia team (MD and/or CRNA) on the day of her procedure for preoperative evaluation/assessment. Questions regarding anesthetic course will be fielded at that time.   Pre-surgical instructions were reviewed with the patient during her PAT appointment and questions were fielded by PAT clinical staff. Patient was advised that if any questions or concerns arise prior to her procedure then she should return a call to PAT and/or her surgeon's office to discuss.  Honor Loh, MSN, APRN, FNP-C, CEN [facility]  Peri-operative Services Nurse Practitioner Phone: [phone] 12/01/20 9:50 AM  NOTE: This note has been prepared using Dragon dictation software. Despite my best ability to proofread, there is always the potential that unintentional transcriptional errors may still occur from this process.

## 2020-11-29 DIAGNOSIS — D631 Anemia in chronic kidney disease: Secondary | ICD-10-CM | POA: Diagnosis not present

## 2020-11-29 DIAGNOSIS — N186 End stage renal disease: Secondary | ICD-10-CM | POA: Diagnosis not present

## 2020-11-29 DIAGNOSIS — Z992 Dependence on renal dialysis: Secondary | ICD-10-CM | POA: Diagnosis not present

## 2020-11-29 DIAGNOSIS — D509 Iron deficiency anemia, unspecified: Secondary | ICD-10-CM | POA: Diagnosis not present

## 2020-11-29 DIAGNOSIS — N2581 Secondary hyperparathyroidism of renal origin: Secondary | ICD-10-CM | POA: Diagnosis not present

## 2020-11-29 DIAGNOSIS — Z23 Encounter for immunization: Secondary | ICD-10-CM | POA: Diagnosis not present

## 2020-11-30 ENCOUNTER — Other Ambulatory Visit: Payer: Self-pay

## 2020-11-30 ENCOUNTER — Encounter
Admission: RE | Admit: 2020-11-30 | Discharge: 2020-11-30 | Disposition: A | Discharge status: Home / Self Care | Payer: Medicare Other | Source: Ambulatory Visit | Attending: Vascular Surgery | Admitting: Vascular Surgery

## 2020-11-30 DIAGNOSIS — Z01812 Encounter for preprocedural laboratory examination: Secondary | ICD-10-CM | POA: Diagnosis not present

## 2020-11-30 DIAGNOSIS — Z7901 Long term (current) use of anticoagulants: Secondary | ICD-10-CM | POA: Diagnosis not present

## 2020-11-30 DIAGNOSIS — N186 End stage renal disease: Secondary | ICD-10-CM | POA: Diagnosis not present

## 2020-11-30 DIAGNOSIS — Z7984 Long term (current) use of oral hypoglycemic drugs: Secondary | ICD-10-CM | POA: Diagnosis not present

## 2020-11-30 DIAGNOSIS — I120 Hypertensive chronic kidney disease with stage 5 chronic kidney disease or end stage renal disease: Secondary | ICD-10-CM | POA: Diagnosis not present

## 2020-11-30 DIAGNOSIS — E785 Hyperlipidemia, unspecified: Secondary | ICD-10-CM | POA: Diagnosis not present

## 2020-11-30 DIAGNOSIS — Z79899 Other long term (current) drug therapy: Secondary | ICD-10-CM | POA: Diagnosis not present

## 2020-11-30 DIAGNOSIS — Z886 Allergy status to analgesic agent status: Secondary | ICD-10-CM | POA: Diagnosis not present

## 2020-11-30 DIAGNOSIS — E1122 Type 2 diabetes mellitus with diabetic chronic kidney disease: Secondary | ICD-10-CM | POA: Diagnosis not present

## 2020-11-30 DIAGNOSIS — Z01818 Encounter for other preprocedural examination: Secondary | ICD-10-CM | POA: Diagnosis not present

## 2020-11-30 DIAGNOSIS — Z992 Dependence on renal dialysis: Secondary | ICD-10-CM | POA: Diagnosis not present

## 2020-11-30 LAB — BASIC METABOLIC PANEL
Anion gap: 11 (ref 5–15)
BUN: 13 mg/dL (ref 8–23)
CO2: 32 mmol/L (ref 22–32)
Calcium: 8.6 mg/dL — ABNORMAL LOW (ref 8.9–10.3)
Chloride: 95 mmol/L — ABNORMAL LOW (ref 98–111)
Creatinine, Ser: 2.93 mg/dL — ABNORMAL HIGH (ref 0.44–1.00)
GFR, Estimated: 15 mL/min — ABNORMAL LOW (ref >60)
Glucose, Bld: 271 mg/dL — ABNORMAL HIGH (ref 70–99)
Potassium: 3.4 mmol/L — ABNORMAL LOW (ref 3.5–5.1)
Sodium: 138 mmol/L (ref 135–145)

## 2020-11-30 LAB — CBC WITH DIFFERENTIAL/PLATELET
Abs Immature Granulocytes: 0.01 10*3/uL (ref 0.00–0.07)
Basophils Absolute: 0 10*3/uL (ref 0.0–0.1)
Basophils Relative: 1 %
Eosinophils Absolute: 0.1 10*3/uL (ref 0.0–0.5)
Eosinophils Relative: 3 %
HCT: 30.4 % — ABNORMAL LOW (ref 36.0–46.0)
Hemoglobin: 10.1 g/dL — ABNORMAL LOW (ref 12.0–15.0)
Immature Granulocytes: 0 %
Lymphocytes Relative: 25 %
Lymphs Abs: 0.8 10*3/uL (ref 0.7–4.0)
MCH: 29.9 pg (ref 26.0–34.0)
MCHC: 33.2 g/dL (ref 30.0–36.0)
MCV: 89.9 fL (ref 80.0–100.0)
Monocytes Absolute: 0.4 10*3/uL (ref 0.1–1.0)
Monocytes Relative: 14 %
Neutro Abs: 1.8 10*3/uL (ref 1.7–7.7)
Neutrophils Relative %: 57 %
Platelets: 135 10*3/uL — ABNORMAL LOW (ref 150–400)
RBC: 3.38 MIL/uL — ABNORMAL LOW (ref 3.87–5.11)
RDW: 12.5 % (ref 11.5–15.5)
WBC: 3.1 10*3/uL — ABNORMAL LOW (ref 4.0–10.5)
nRBC: 0 % (ref 0.0–0.2)

## 2020-11-30 LAB — TYPE AND SCREEN
ABO/RH(D): A POS
Antibody Screen: NEGATIVE

## 2020-11-30 LAB — PROTIME-INR
INR: 1.2 (ref 0.8–1.2)
Prothrombin Time: 14.8 seconds (ref 11.4–15.2)

## 2020-11-30 LAB — APTT: aPTT: 33 seconds (ref 24–36)

## 2020-12-01 ENCOUNTER — Telehealth: Payer: Self-pay | Admitting: Family Medicine

## 2020-12-01 ENCOUNTER — Telehealth: Payer: Self-pay | Admitting: Pharmacist

## 2020-12-01 ENCOUNTER — Telehealth: Payer: Medicare Other

## 2020-12-01 DIAGNOSIS — D631 Anemia in chronic kidney disease: Secondary | ICD-10-CM | POA: Diagnosis not present

## 2020-12-01 DIAGNOSIS — N2581 Secondary hyperparathyroidism of renal origin: Secondary | ICD-10-CM | POA: Diagnosis not present

## 2020-12-01 DIAGNOSIS — Z23 Encounter for immunization: Secondary | ICD-10-CM | POA: Diagnosis not present

## 2020-12-01 DIAGNOSIS — D509 Iron deficiency anemia, unspecified: Secondary | ICD-10-CM | POA: Diagnosis not present

## 2020-12-01 DIAGNOSIS — N186 End stage renal disease: Secondary | ICD-10-CM | POA: Diagnosis not present

## 2020-12-01 DIAGNOSIS — Z992 Dependence on renal dialysis: Secondary | ICD-10-CM | POA: Diagnosis not present

## 2020-12-01 NOTE — Pre-Procedure Instructions (Addendum)
Did anesthesia phone interview with pts sister Brianna Dodson (pt had a stroke and is very slow with speech and has broken speech). Brianna Dodson did not know all the medicines that her sister took except that she was taking Eliquis. I instructed Brianna Dodson on when to stop Eliquis BUT I had instructed Brianna Dodson to bring in list of pts medicines on Tuesday (5-31) when she brought pt to PAT for labs and ekg. I was off yesterday and Brianna Dodson said she brought in list but that the nurse folded it back up and put in pts bag. No copy was made that I can find. I called Brianna Dodson this morning about this and this is what she said happened.   Brianna Dodson said her sister is at dialysis now and that her list is with her sister.  I instructed Brianna Dodson not to let pt take any of her meds in the morning (6-2) since we dont know exactly what she is taking and  to bring all her sisters medication bottles in the morning to SDS so the nurse can put in the correct names and dosages of medicines.Brianna Dodson verbalized understanding

## 2020-12-01 NOTE — Telephone Encounter (Signed)
  Chronic Care Management   Note  12/01/2020 Name: Brianna Dodson MRN: [id number] DOB: 01/15/1934   Attempted to contact patient for scheduled appointment for medication management support. Patient just left for dialysis. Appointment for graft placement tomorrow. Will r/s f/u for later this month  Catie Darnelle Maffucci, PharmD, [place], [place] Clinical Pharmacist [employer] [facility] [place]

## 2020-12-01 NOTE — Telephone Encounter (Signed)
Patient called in need refill for Lancets (ONETOUCH ULTRASOFT) lancets

## 2020-12-02 ENCOUNTER — Encounter
Admission: RE | Disposition: A | Discharge status: Home / Self Care | Payer: Self-pay | Source: Home / Self Care | Attending: Vascular Surgery

## 2020-12-02 ENCOUNTER — Ambulatory Visit: Payer: Medicare Other

## 2020-12-02 ENCOUNTER — Other Ambulatory Visit: Payer: Self-pay

## 2020-12-02 ENCOUNTER — Ambulatory Visit
Admission: RE | Admit: 2020-12-02 | Discharge: 2020-12-02 | Disposition: A | Discharge status: Home / Self Care | Payer: Medicare Other | Attending: Vascular Surgery | Admitting: Vascular Surgery

## 2020-12-02 ENCOUNTER — Ambulatory Visit: Payer: Medicare Other | Admitting: Urgent Care

## 2020-12-02 ENCOUNTER — Encounter: Payer: Self-pay | Admitting: Vascular Surgery

## 2020-12-02 DIAGNOSIS — E785 Hyperlipidemia, unspecified: Secondary | ICD-10-CM | POA: Diagnosis not present

## 2020-12-02 DIAGNOSIS — Z419 Encounter for procedure for purposes other than remedying health state, unspecified: Secondary | ICD-10-CM

## 2020-12-02 DIAGNOSIS — E1122 Type 2 diabetes mellitus with diabetic chronic kidney disease: Secondary | ICD-10-CM | POA: Insufficient documentation

## 2020-12-02 DIAGNOSIS — D631 Anemia in chronic kidney disease: Secondary | ICD-10-CM | POA: Diagnosis not present

## 2020-12-02 DIAGNOSIS — Z886 Allergy status to analgesic agent status: Secondary | ICD-10-CM | POA: Insufficient documentation

## 2020-12-02 DIAGNOSIS — N186 End stage renal disease: Secondary | ICD-10-CM | POA: Insufficient documentation

## 2020-12-02 DIAGNOSIS — Z7984 Long term (current) use of oral hypoglycemic drugs: Secondary | ICD-10-CM | POA: Insufficient documentation

## 2020-12-02 DIAGNOSIS — I120 Hypertensive chronic kidney disease with stage 5 chronic kidney disease or end stage renal disease: Secondary | ICD-10-CM | POA: Diagnosis not present

## 2020-12-02 DIAGNOSIS — Z992 Dependence on renal dialysis: Secondary | ICD-10-CM | POA: Diagnosis not present

## 2020-12-02 DIAGNOSIS — Z79899 Other long term (current) drug therapy: Secondary | ICD-10-CM | POA: Insufficient documentation

## 2020-12-02 DIAGNOSIS — Z7901 Long term (current) use of anticoagulants: Secondary | ICD-10-CM | POA: Insufficient documentation

## 2020-12-02 HISTORY — DX: Other specified disorders of brain: G93.89

## 2020-12-02 HISTORY — PX: AV FISTULA PLACEMENT: SHX1204

## 2020-12-02 HISTORY — DX: Long term (current) use of anticoagulants: Z79.01

## 2020-12-02 HISTORY — DX: Unspecified viral hepatitis C without hepatic coma: B19.20

## 2020-12-02 HISTORY — DX: Obstructive sleep apnea (adult) (pediatric): G47.33

## 2020-12-02 HISTORY — DX: Type 2 diabetes mellitus without complications: E11.9

## 2020-12-02 LAB — POCT I-STAT, CHEM 8
BUN: 5 mg/dL — ABNORMAL LOW (ref 8–23)
Calcium, Ion: 1.05 mmol/L — ABNORMAL LOW (ref 1.15–1.40)
Chloride: 98 mmol/L (ref 98–111)
Creatinine, Ser: 2.6 mg/dL — ABNORMAL HIGH (ref 0.44–1.00)
Glucose, Bld: 157 mg/dL — ABNORMAL HIGH (ref 70–99)
HCT: 33 % — ABNORMAL LOW (ref 36.0–46.0)
Hemoglobin: 11.2 g/dL — ABNORMAL LOW (ref 12.0–15.0)
Potassium: 3.3 mmol/L — ABNORMAL LOW (ref 3.5–5.1)
Sodium: 143 mmol/L (ref 135–145)
TCO2: 31 mmol/L (ref 22–32)

## 2020-12-02 LAB — ABO/RH: ABO/RH(D): A POS

## 2020-12-02 LAB — GLUCOSE, CAPILLARY: Glucose-Capillary: 131 mg/dL — ABNORMAL HIGH (ref 70–99)

## 2020-12-02 SURGERY — INSERTION OF ARTERIOVENOUS (AV) GORE-TEX GRAFT ARM
Anesthesia: Regional | Laterality: Left

## 2020-12-02 MED ORDER — ACETAMINOPHEN 160 MG/5ML PO SOLN
325.0000 mg | ORAL | Status: DC | PRN
  Filled 2020-12-02: qty 20.3

## 2020-12-02 MED ORDER — FENTANYL CITRATE (PF) 100 MCG/2ML IJ SOLN
25.0000 ug | Freq: Once | INTRAMUSCULAR | Status: AC
  Administered 2020-12-02: 25 ug via INTRAVENOUS

## 2020-12-02 MED ORDER — DEXMEDETOMIDINE HCL 200 MCG/2ML IV SOLN
INTRAVENOUS | Status: DC | PRN
  Administered 2020-12-02: 8 ug via INTRAVENOUS

## 2020-12-02 MED ORDER — EPHEDRINE 5 MG/ML INJ
INTRAVENOUS | Status: AC
  Filled 2020-12-02: qty 10

## 2020-12-02 MED ORDER — DEXMEDETOMIDINE (PRECEDEX) IN NS 20 MCG/5ML (4 MCG/ML) IV SYRINGE
PREFILLED_SYRINGE | INTRAVENOUS | Status: AC
  Filled 2020-12-02: qty 5

## 2020-12-02 MED ORDER — ORAL CARE MOUTH RINSE: 15.0000 mL | Freq: Once | OROMUCOSAL | Status: DC

## 2020-12-02 MED ORDER — ONETOUCH ULTRASOFT LANCETS MISC: 6 refills | Status: DC

## 2020-12-02 MED ORDER — CHLORHEXIDINE GLUCONATE CLOTH 2 % EX PADS: 6.0000 | MEDICATED_PAD | Freq: Once | CUTANEOUS | Status: DC

## 2020-12-02 MED ORDER — SODIUM CHLORIDE 0.9 % IV SOLN
INTRAVENOUS | Status: DC | PRN
  Administered 2020-12-02: 10 mL via INTRAMUSCULAR

## 2020-12-02 MED ORDER — PROPOFOL 500 MG/50ML IV EMUL
INTRAVENOUS | Status: DC | PRN
  Administered 2020-12-02: 60 ug/kg/min via INTRAVENOUS

## 2020-12-02 MED ORDER — FENTANYL CITRATE (PF) 100 MCG/2ML IJ SOLN
25.0000 ug | Freq: Once | INTRAMUSCULAR | Status: AC
Start: 2020-12-02 — End: 2020-12-02
  Administered 2020-12-02: 25 ug via INTRAVENOUS

## 2020-12-02 MED ORDER — CEFAZOLIN SODIUM 1 G IJ SOLR
INTRAMUSCULAR | Status: AC
  Filled 2020-12-02: qty 10

## 2020-12-02 MED ORDER — BUPIVACAINE-EPINEPHRINE (PF) 0.5% -1:200000 IJ SOLN
INTRAMUSCULAR | Status: AC
  Filled 2020-12-02: qty 30

## 2020-12-02 MED ORDER — ONDANSETRON HCL 4 MG/2ML IJ SOLN
INTRAMUSCULAR | Status: AC
  Filled 2020-12-02: qty 2

## 2020-12-02 MED ORDER — ROPIVACAINE HCL 5 MG/ML IJ SOLN
INTRAMUSCULAR | Status: AC
  Filled 2020-12-02: qty 30

## 2020-12-02 MED ORDER — PAPAVERINE HCL 30 MG/ML IJ SOLN
INTRAMUSCULAR | Status: AC
  Filled 2020-12-02: qty 2

## 2020-12-02 MED ORDER — PROPOFOL 10 MG/ML IV BOLUS
INTRAVENOUS | Status: DC | PRN
  Administered 2020-12-02: 40 mg via INTRAVENOUS

## 2020-12-02 MED ORDER — EPINEPHRINE PF 1 MG/ML IJ SOLN
INTRAMUSCULAR | Status: AC
  Filled 2020-12-02: qty 1

## 2020-12-02 MED ORDER — HEPARIN SODIUM (PORCINE) 1000 UNIT/ML IJ SOLN
INTRAMUSCULAR | Status: DC | PRN
  Administered 2020-12-02: 3000 [IU] via INTRAVENOUS

## 2020-12-02 MED ORDER — HEPARIN SODIUM (PORCINE) 5000 UNIT/ML IJ SOLN
INTRAMUSCULAR | Status: AC
  Filled 2020-12-02: qty 1

## 2020-12-02 MED ORDER — FENTANYL CITRATE (PF) 100 MCG/2ML IJ SOLN: 100.0000 ug | Freq: Once | INTRAMUSCULAR | Status: DC

## 2020-12-02 MED ORDER — SODIUM CHLORIDE 0.9 % IV SOLN
1.0000 g | INTRAVENOUS | Status: AC
  Administered 2020-12-02: 1 g via INTRAVENOUS

## 2020-12-02 MED ORDER — PROPOFOL 500 MG/50ML IV EMUL
INTRAVENOUS | Status: AC
  Filled 2020-12-02: qty 50

## 2020-12-02 MED ORDER — ACETAMINOPHEN 325 MG PO TABS: 325.0000 mg | ORAL_TABLET | ORAL | Status: DC | PRN

## 2020-12-02 MED ORDER — SODIUM CHLORIDE 0.9 % IV SOLN: INTRAVENOUS | Status: DC

## 2020-12-02 MED ORDER — CHLORHEXIDINE GLUCONATE 0.12 % MT SOLN: 15.0000 mL | Freq: Once | OROMUCOSAL | Status: DC

## 2020-12-02 MED ORDER — FENTANYL CITRATE (PF) 100 MCG/2ML IJ SOLN: 25.0000 ug | INTRAMUSCULAR | Status: DC | PRN

## 2020-12-02 MED ORDER — EPHEDRINE SULFATE 50 MG/ML IJ SOLN
INTRAMUSCULAR | Status: DC | PRN
  Administered 2020-12-02: 10 mg via INTRAVENOUS

## 2020-12-02 MED ORDER — PAPAVERINE HCL 30 MG/ML IJ SOLN
INTRAMUSCULAR | Status: DC | PRN
  Administered 2020-12-02: 60 mg

## 2020-12-02 MED ORDER — FENTANYL CITRATE (PF) 100 MCG/2ML IJ SOLN
INTRAMUSCULAR | Status: AC
  Filled 2020-12-02: qty 2

## 2020-12-02 MED ORDER — ONDANSETRON HCL 4 MG/2ML IJ SOLN: 4.0000 mg | Freq: Once | INTRAMUSCULAR | Status: DC | PRN

## 2020-12-02 MED ORDER — FAMOTIDINE 20 MG PO TABS: 20.0000 mg | ORAL_TABLET | Freq: Once | ORAL | Status: DC

## 2020-12-02 MED ORDER — HYDROCODONE-ACETAMINOPHEN 7.5-325 MG PO TABS
1.0000 | ORAL_TABLET | Freq: Once | ORAL | Status: DC | PRN
Start: 2020-12-02 — End: 2020-12-02

## 2020-12-02 MED ORDER — SEVOFLURANE IN SOLN
RESPIRATORY_TRACT | Status: AC
  Filled 2020-12-02: qty 250

## 2020-12-02 SURGICAL SUPPLY — 54 items
ADH SKN CLS APL DERMABOND .7 (GAUZE/BANDAGES/DRESSINGS) ×1
APL PRP STRL LF DISP 70% ISPRP (MISCELLANEOUS) ×1
BAG DECANTER FOR FLEXI CONT (MISCELLANEOUS) ×2 IMPLANT
BLADE SURG SZ11 CARB STEEL (BLADE) ×2 IMPLANT
BOOT SUTURE AID YELLOW STND (SUTURE) ×2 IMPLANT
BRUSH SCRUB EZ  4% CHG (MISCELLANEOUS) ×1
BRUSH SCRUB EZ 4% CHG (MISCELLANEOUS) ×1 IMPLANT
CHLORAPREP W/TINT 26 (MISCELLANEOUS) ×2 IMPLANT
CLIP SPRNG 6MM S-JAW DBL (CLIP) ×2
COVER WAND RF STERILE (DRAPES) ×2 IMPLANT
DECANTER SPIKE VIAL GLASS SM (MISCELLANEOUS) ×2 IMPLANT
DERMABOND ADVANCED (GAUZE/BANDAGES/DRESSINGS) ×1
DERMABOND ADVANCED .7 DNX12 (GAUZE/BANDAGES/DRESSINGS) ×1 IMPLANT
ELECT CAUTERY BLADE 6.4 (BLADE) ×2 IMPLANT
ELECT REM PT RETURN 9FT ADLT (ELECTROSURGICAL) ×2
ELECTRODE REM PT RTRN 9FT ADLT (ELECTROSURGICAL) ×1 IMPLANT
GLOVE SURG ENC MOIS LTX SZ7 (GLOVE) ×2 IMPLANT
GLOVE SURG SYN 7.0 (GLOVE) ×2 IMPLANT
GLOVE SURG UNDER LTX SZ7.5 (GLOVE) ×2 IMPLANT
GOWN STRL REUS W/ TWL LRG LVL3 (GOWN DISPOSABLE) ×1 IMPLANT
GOWN STRL REUS W/ TWL XL LVL3 (GOWN DISPOSABLE) ×2 IMPLANT
GOWN STRL REUS W/TWL LRG LVL3 (GOWN DISPOSABLE) ×2
GOWN STRL REUS W/TWL XL LVL3 (GOWN DISPOSABLE) ×4
HEMOSTAT SURGICEL 2X3 (HEMOSTASIS) ×2 IMPLANT
IV NS 500ML (IV SOLUTION) ×2
IV NS 500ML BAXH (IV SOLUTION) ×1 IMPLANT
KIT TURNOVER KIT A (KITS) ×2 IMPLANT
LABEL OR SOLS (LABEL) ×2 IMPLANT
LOOP RED MAXI  1X406MM (MISCELLANEOUS) ×1
LOOP VESSEL MAXI 1X406 RED (MISCELLANEOUS) ×1 IMPLANT
LOOP VESSEL MINI 0.8X406 BLUE (MISCELLANEOUS) ×1 IMPLANT
LOOPS BLUE MINI 0.8X406MM (MISCELLANEOUS) ×1
MANIFOLD NEPTUNE II (INSTRUMENTS) ×2 IMPLANT
NEEDLE FILTER BLUNT 18X 1/2SAF (NEEDLE) ×1
NEEDLE FILTER BLUNT 18X1 1/2 (NEEDLE) ×1 IMPLANT
NS IRRIG 500ML POUR BTL (IV SOLUTION) ×2 IMPLANT
PACK EXTREMITY ARMC (MISCELLANEOUS) ×2 IMPLANT
PAD PREP 24X41 OB/GYN DISP (PERSONAL CARE ITEMS) ×2 IMPLANT
SOLUTION CELL SAVER (CLIP) ×1 IMPLANT
STOCKINETTE STRL 4IN 9604848 (GAUZE/BANDAGES/DRESSINGS) ×2 IMPLANT
SUT GORETEX CV-6TTC-13 36IN (SUTURE) ×4 IMPLANT
SUT MNCRL AB 4-0 PS2 18 (SUTURE) ×2 IMPLANT
SUT PROLENE 6 0 BV (SUTURE) ×4 IMPLANT
SUT SILK 0 SH 30 (SUTURE) IMPLANT
SUT SILK 2 0 (SUTURE) ×2
SUT SILK 2-0 18XBRD TIE 12 (SUTURE) ×1 IMPLANT
SUT SILK 3 0 (SUTURE) ×2
SUT SILK 3-0 18XBRD TIE 12 (SUTURE) ×1 IMPLANT
SUT SILK 4 0 (SUTURE) ×2
SUT SILK 4-0 18XBRD TIE 12 (SUTURE) ×1 IMPLANT
SUT VIC AB 3-0 SH 27 (SUTURE)
SUT VIC AB 3-0 SH 27X BRD (SUTURE) IMPLANT
SYR 20ML LL LF (SYRINGE) ×2 IMPLANT
SYR 3ML LL SCALE MARK (SYRINGE) ×2 IMPLANT

## 2020-12-02 NOTE — Transfer of Care (Signed)
Immediate Anesthesia Transfer of Care Note  Patient: Brianna Dodson  Procedure(s) Performed: INSERTION OF ARTERIOVENOUS (AV)  FISTULA  ARM ( BRACHIO- CEPHALIC) (Left )  Patient Location: PACU  Anesthesia Type:General  Level of Consciousness: sedated  Airway & Oxygen Therapy: Patient Spontanous Breathing and Patient connected to face mask oxygen  Post-op Assessment: Report given to RN and Post -op Vital signs reviewed and stable  Post vital signs: Reviewed and stable  Last Vitals:  Vitals Value Taken Time  BP 126/68 12/02/20 1459  Temp    Pulse 52 12/02/20 1459  Resp 13 12/02/20 1459  SpO2 100 % 12/02/20 1459  Vitals shown include unvalidated device data.  Last Pain:  Vitals:   12/02/20 1459  TempSrc:   PainSc: 0-No pain         Complications: No complications documented.

## 2020-12-02 NOTE — Discharge Instructions (Signed)

## 2020-12-02 NOTE — Op Note (Signed)
[facility]   OPERATIVE NOTE   PROCEDURE: Left brachiocephalic arteriovenous fistula placement  PRE-OPERATIVE DIAGNOSIS: 1.  ESRD        POST-OPERATIVE DIAGNOSIS: 1. ESRD       SURGEON: Leotis Pain, MD  ASSISTANT(S): Hezzie Bump, PA-C  ANESTHESIA: general  ESTIMATED BLOOD LOSS: 10 cc  FINDING(S): Adequate cephalic vein for fistula creation, better than expected from the preoperative vein mapping  SPECIMEN(S):  none  INDICATIONS:   Brianna Dodson is a 87 y.o. female who presents with renal failure in need of pemanent dialysis acces.  The patient is scheduled for left arm AV access placement.  The patient is aware the risks include but are not limited to: bleeding, infection, steal syndrome, nerve damage, ischemic monomelic neuropathy, failure to mature, and need for additional procedures.  The patient is aware of the risks of the procedure and elects to proceed forward. An assistant was present during the procedure to help facilitate the exposure and expedite the procedure.  DESCRIPTION: After full informed written consent was obtained from the patient, the patient was brought back to the operating room and placed supine upon the operating table.  Prior to induction, the patient received IV antibiotics. The assistant provided retraction and mobilization to help facilitate exposure and expedite the procedure throughout the entire procedure.  This included following suture, using retractors, and optimizing lighting.  After obtaining adequate anesthesia, the patient was then prepped and draped in the standard fashion for a left arm access procedure.  I made a curvilinear incision at the level of the antecubital fossa and dissected through the subcutaneous tissue and fascia to gain exposure of the brachial artery.  This was noted to be patent and adequate in size for fistula creation.  This was dissected out proximally and distally and prepared for control with vessel  loops .  I then dissected out the cephalic vein.  This was noted to be patent and adequate in size for fistula creation.  This was better than expected based off the preoperative vein mapping, but appears to be at least 3 mm in diameter.  I then gave the patient 3000 units of intravenous heparin.  The vein was marked for orientation and the distal segment of the vein was ligated with a  2-0 silk, and the vein was transected.  I then instilled the heparinized saline into the vein and clamped it.  At this point, I reset my exposure of the brachial artery and pulled up control on the vessel loops.  I made an arteriotomy with a #11 blade, and then I extended the arteriotomy with a Potts scissor.  I injected heparinized saline proximal and distal to this arteriotomy.  The vein was then sewn to the artery in an end-to-side configuration with a running stitch of 6-0 Prolene.  Prior to completing this anastomosis, I allowed the vein and artery to backbleed.  There was no evidence of clot from any vessels.  I completed the anastomosis in the usual fashion and then released all vessel loops and clamps.  There was a palpable  thrill in the venous outflow, and there was a palpable pulse in the artery distal to the anastomosis.  At this point, I irrigated out the surgical wound.  Surgicel was placed. There was no further active bleeding.  The subcutaneous tissue was reapproximated with a running stitch of 3-0 Vicryl.  The skin was then closed with a 4-0 Monocryl suture.  The skin was then cleaned, dried, and  reinforced with Dermabond.  The patient tolerated this procedure well and was taken to the recovery room in stable condition  COMPLICATIONS: None  CONDITION: Stable   Leotis Pain    12/02/2020, 2:53 PM  This note was created with Dragon Medical transcription system. Any errors in dictation are purely unintentional.

## 2020-12-02 NOTE — Anesthesia Preprocedure Evaluation (Addendum)
Anesthesia Evaluation  Patient identified by MRN, date of birth, ID band Patient awake    Reviewed: Allergy & Precautions, H&P , NPO status , reviewed documented beta blocker date and time   Airway Mallampati: III  TM Distance: >3 FB Neck ROM: limited    Dental  (+) Edentulous Upper, Chipped, Missing   Pulmonary sleep apnea ,    Pulmonary exam normal        Cardiovascular hypertension, Normal cardiovascular exam     Neuro/Psych PSYCHIATRIC DISORDERS Dementia L arm weakness, speech residual TIA Neuromuscular disease CVA, Residual Symptoms    GI/Hepatic (+) Hepatitis -  Endo/Other  diabetes  Renal/GU Dialysis and ESRFRenal diseaseDialysis yesterday     Musculoskeletal   Abdominal   Peds  Hematology  (+) Blood dyscrasia, anemia ,   Anesthesia Other Findings Past Medical History: No date: A-fib [facility]) No date: Alzheimer disease ([place]) No date: Anemia No date: Carotid stenosis 04/12/2019: Cerebral infarction [facility])     Comment:  Multiple new foci of acute infarction affect the RIGHT               hemisphere affecting the frontal, posterior frontal,               posterior temporal, anterior parietal cortex and regional              white matter. 08/20/2019: Cerebral infarction due to embolism of right middle  cerebral artery (HCC)     Comment:  RIGHT MCA distal M1 No date: Chronic anticoagulation     Comment:  Apixaban No date: CKD (chronic kidney disease), stage 4(HCC) 87/56/4332: Embolic stroke involving cerebral artery (HCC)     Comment:  RIGHT MCA/PCA territory No date: Encephalomalacia     Comment:  RIGHT posterior MCA territory No date: HCV (hepatitis C virus) No date: History of blood transfusion No date: Hyperlipidemia No date: Hypertension No date: OSA (obstructive sleep apnea) 07/30/2019: Status post placement of implantable loop recorder No date: T2DM (type 2 diabetes mellitus) ([place])  Past Surgical  History: 1985: ABDOMINAL HYSTERECTOMY 10/06/2020: DIALYSIS/PERMA CATHETER INSERTION; N/A     Comment:  Procedure: DIALYSIS/PERMA CATHETER INSERTION;  Surgeon:               Algernon Huxley, MD;  Location: [place] [facility];                Service: Cardiovascular;  Laterality: N/A; 07/30/2019: LOOP RECORDER INSERTION; N/A     Comment:  Procedure: LOOP RECORDER INSERTION;  Surgeon: Isaias Cowman, MD;  Location: [place] [facility];  Service:              Cardiovascular;  Laterality: N/A; 04/14/2019: TEE WITHOUT CARDIOVERSION; N/A     Comment:  Procedure: TRANSESOPHAGEAL ECHOCARDIOGRAM (TEE);                Surgeon: Teodoro Spray, MD;  Location: [facility] ORS;                Service: Cardiovascular;  Laterality: N/A;  BMI    Body Mass Index: 25.29 kg/m      Reproductive/Obstetrics                             Anesthesia Physical Anesthesia Plan  ASA: IV  Anesthesia Plan: General and Regional   Post-op Pain Management:  Regional for Post-op pain  Induction: Intravenous  PONV Risk Score and Plan: 2 and Treatment may vary due to age or medical condition and Propofol infusion  Airway Management Planned: Nasal Cannula and Natural Airway  Additional Equipment:   Intra-op Plan:   Post-operative Plan:   Informed Consent: I have reviewed the patients History and Physical, chart, labs and discussed the procedure including the risks, benefits and alternatives for the proposed anesthesia with the patient or authorized representative who has indicated his/her understanding and acceptance.     Dental Advisory Given  Plan Discussed with: CRNA  Anesthesia Plan Comments: (Discussed regional block/TIVA w pt and sister. Accepted. Off eliquis x 3d)       Anesthesia Quick Evaluation

## 2020-12-02 NOTE — Interval H&P Note (Signed)
History and Physical Interval Note:  12/02/2020 10:15 AM  Brianna Dodson  has presented today for surgery, with the diagnosis of ESRD.  The various methods of treatment have been discussed with the patient and family. After consideration of risks, benefits and other options for treatment, the patient has consented to  Procedure(s): INSERTION OF ARTERIOVENOUS (AV) GORE-TEX GRAFT ARM ( BRACHIAL AXILLARY ) (Left) as a surgical intervention.  The patient's history has been reviewed, patient examined, no change in status, stable for surgery.  I have reviewed the patient's chart and labs.  Questions were answered to the patient's satisfaction.     Leotis Pain

## 2020-12-03 ENCOUNTER — Encounter: Payer: Self-pay | Admitting: Vascular Surgery

## 2020-12-03 DIAGNOSIS — N186 End stage renal disease: Secondary | ICD-10-CM | POA: Diagnosis not present

## 2020-12-03 DIAGNOSIS — Z992 Dependence on renal dialysis: Secondary | ICD-10-CM | POA: Diagnosis not present

## 2020-12-03 DIAGNOSIS — D509 Iron deficiency anemia, unspecified: Secondary | ICD-10-CM | POA: Diagnosis not present

## 2020-12-03 DIAGNOSIS — D631 Anemia in chronic kidney disease: Secondary | ICD-10-CM | POA: Diagnosis not present

## 2020-12-03 DIAGNOSIS — Z23 Encounter for immunization: Secondary | ICD-10-CM | POA: Diagnosis not present

## 2020-12-03 DIAGNOSIS — N2581 Secondary hyperparathyroidism of renal origin: Secondary | ICD-10-CM | POA: Diagnosis not present

## 2020-12-05 ENCOUNTER — Other Ambulatory Visit: Payer: Self-pay

## 2020-12-05 ENCOUNTER — Emergency Department
Admission: EM | Admit: 2020-12-05 | Discharge: 2020-12-05 | Disposition: A | Discharge status: Home / Self Care | Payer: Medicare Other | Attending: Emergency Medicine | Admitting: Emergency Medicine

## 2020-12-05 ENCOUNTER — Encounter: Payer: Self-pay | Admitting: Emergency Medicine

## 2020-12-05 ENCOUNTER — Emergency Department: Payer: Medicare Other

## 2020-12-05 DIAGNOSIS — M7989 Other specified soft tissue disorders: Secondary | ICD-10-CM | POA: Diagnosis not present

## 2020-12-05 DIAGNOSIS — L03114 Cellulitis of left upper limb: Secondary | ICD-10-CM | POA: Insufficient documentation

## 2020-12-05 DIAGNOSIS — Z79899 Other long term (current) drug therapy: Secondary | ICD-10-CM | POA: Insufficient documentation

## 2020-12-05 DIAGNOSIS — N185 Chronic kidney disease, stage 5: Secondary | ICD-10-CM | POA: Insufficient documentation

## 2020-12-05 DIAGNOSIS — I120 Hypertensive chronic kidney disease with stage 5 chronic kidney disease or end stage renal disease: Secondary | ICD-10-CM | POA: Diagnosis not present

## 2020-12-05 DIAGNOSIS — G309 Alzheimer's disease, unspecified: Secondary | ICD-10-CM | POA: Diagnosis not present

## 2020-12-05 DIAGNOSIS — Z7901 Long term (current) use of anticoagulants: Secondary | ICD-10-CM | POA: Insufficient documentation

## 2020-12-05 DIAGNOSIS — E1122 Type 2 diabetes mellitus with diabetic chronic kidney disease: Secondary | ICD-10-CM | POA: Diagnosis not present

## 2020-12-05 DIAGNOSIS — M79602 Pain in left arm: Secondary | ICD-10-CM | POA: Diagnosis not present

## 2020-12-05 LAB — CBC WITH DIFFERENTIAL/PLATELET
Abs Immature Granulocytes: 0.01 10*3/uL (ref 0.00–0.07)
Basophils Absolute: 0 10*3/uL (ref 0.0–0.1)
Basophils Relative: 1 %
Eosinophils Absolute: 0.1 10*3/uL (ref 0.0–0.5)
Eosinophils Relative: 2 %
HCT: 28.6 % — ABNORMAL LOW (ref 36.0–46.0)
Hemoglobin: 9.9 g/dL — ABNORMAL LOW (ref 12.0–15.0)
Immature Granulocytes: 0 %
Lymphocytes Relative: 16 %
Lymphs Abs: 0.6 10*3/uL — ABNORMAL LOW (ref 0.7–4.0)
MCH: 30.6 pg (ref 26.0–34.0)
MCHC: 34.6 g/dL (ref 30.0–36.0)
MCV: 88.3 fL (ref 80.0–100.0)
Monocytes Absolute: 0.4 10*3/uL (ref 0.1–1.0)
Monocytes Relative: 10 %
Neutro Abs: 2.5 10*3/uL (ref 1.7–7.7)
Neutrophils Relative %: 71 %
Platelets: 145 10*3/uL — ABNORMAL LOW (ref 150–400)
RBC: 3.24 MIL/uL — ABNORMAL LOW (ref 3.87–5.11)
RDW: 12.1 % (ref 11.5–15.5)
WBC: 3.5 10*3/uL — ABNORMAL LOW (ref 4.0–10.5)
nRBC: 0 % (ref 0.0–0.2)

## 2020-12-05 LAB — BASIC METABOLIC PANEL
Anion gap: 11 (ref 5–15)
BUN: 14 mg/dL (ref 8–23)
CO2: 28 mmol/L (ref 22–32)
Calcium: 8.6 mg/dL — ABNORMAL LOW (ref 8.9–10.3)
Chloride: 99 mmol/L (ref 98–111)
Creatinine, Ser: 3.17 mg/dL — ABNORMAL HIGH (ref 0.44–1.00)
GFR, Estimated: 14 mL/min — ABNORMAL LOW (ref >60)
Glucose, Bld: 168 mg/dL — ABNORMAL HIGH (ref 70–99)
Potassium: 3.1 mmol/L — ABNORMAL LOW (ref 3.5–5.1)
Sodium: 138 mmol/L (ref 135–145)

## 2020-12-05 LAB — PROTIME-INR
INR: 1.1 (ref 0.8–1.2)
Prothrombin Time: 14.4 seconds (ref 11.4–15.2)

## 2020-12-05 MED ORDER — CIPROFLOXACIN HCL 500 MG PO TABS: 500.0000 mg | ORAL_TABLET | Freq: Every day | ORAL | 0 refills | Status: AC

## 2020-12-05 NOTE — ED Triage Notes (Signed)
Pt in w/swelling to L hand and bicep s/p fistula placement above LAC (6/2 by Dr. [name]). Denies any fevers, but has pain to L arm

## 2020-12-05 NOTE — ED Notes (Signed)
Fistula placement with new swelling. Suture area has some clear drainage. Most of arm is swollen down to hand. Tender to touch.

## 2020-12-05 NOTE — ED Provider Notes (Signed)
[facility] Emergency Department Provider Note ____________________________________________   Event Date/Time   First MD Initiated Contact with Patient 12/05/20 1303     (approximate)  I have reviewed the triage vital signs and the nursing notes.   HISTORY  Chief Complaint Arm Swelling and Post-op Problem  HPI Brianna Dodson is a 87 y.o. female with history of A. fib, CVA, CKD, hypertension, type 2 diabetes and history as listed below presents to the emergency department for treatment and evaluation of left arm pain and swelling 3 days after having an AV fistula placed.  She has had swelling in the arm down to the hand and fingers for the past 2 days.  She noticed some redness around the insertion site last night which was worse this morning.  She denies fever.  She is still able to flex and extend at the elbow.  She has had no drainage.  No alleviating measures attempted prior to arrival..         Past Medical History:  Diagnosis Date  . A-fib ([place])   . Alzheimer disease ([place])   . Anemia   . Carotid stenosis   . Cerebral infarction ([place]) 04/12/2019   Multiple new foci of acute infarction affect the RIGHT hemisphere affecting the frontal, posterior frontal, posterior temporal, anterior parietal cortex and regional white matter.  . Cerebral infarction due to embolism of right middle cerebral artery ([place]) 08/20/2019   RIGHT MCA distal M1  . Chronic anticoagulation    Apixaban  . CKD (chronic kidney disease), stage 4(HCC)   . Embolic stroke involving cerebral artery ([place]) 03/15/2019   RIGHT MCA/PCA territory  . Encephalomalacia    RIGHT posterior MCA territory  . HCV (hepatitis C virus)   . History of blood transfusion   . Hyperlipidemia   . Hypertension   . OSA (obstructive sleep apnea)   . Status post placement of implantable loop recorder 07/30/2019  . T2DM (type 2 diabetes mellitus) [facility])     Patient Active Problem List   Diagnosis Date Noted   . Malnutrition of moderate degree 09/13/2020  . TIA (transient ischemic attack) 09/10/2020  . Anemia of chronic disease 09/10/2020  . CVA (cerebral vascular accident) ([place]) 09/10/2020  . Sleeping difficulty 07/08/2020  . Fatigue 06/09/2020  . Mixed Alzheimer's and vascular dementia ([place]) 06/09/2020  . Leukopenia 12/08/2019  . Muscle strain 12/08/2019  . Pedal edema 11/26/2019  . Dysarthria and anarthria 11/07/2019  . Atrial fibrillation ([place]) 10/21/2019  . Nocturia 10/21/2019  . Seborrheic keratosis 10/21/2019  . Carotid stenosis 08/27/2019  . Thyroid nodule 08/27/2019  . Status post placement of implantable loop recorder 08/07/2019  . Anemia 04/18/2019  . Aphasia 04/12/2019  . Benign hypertensive renal disease 04/07/2019  . Hyperparathyroidism due to renal insufficiency ([place]) 04/07/2019  . 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 Proteinuria 04/07/2019  . History of stroke with current residual effects 03/15/2019  . Hyperlipidemia associated with type 2 diabetes mellitus ([place]) 07/25/2018  . Itching 10/23/2017  . Nevus 04/17/2017  . CKD (chronic kidney disease), stage V ([place]) 02/02/2015  . Vitamin D deficiency 02/02/2015  . Obesity (BMI 30-39.9) 08/03/2014  . DM type 2 (diabetes mellitus, type 2) ([place]) 01/29/2014  . Essential hypertension 01/13/2014  . HLD (hyperlipidemia) 01/13/2014    Past Surgical History:  Procedure Laterality Date  . ABDOMINAL HYSTERECTOMY  1985  . AV FISTULA PLACEMENT Left 12/02/2020   Procedure: INSERTION  OF ARTERIOVENOUS (AV)  FISTULA  ARM ( BRACHIO- CEPHALIC);  Surgeon: Algernon Huxley, MD;  Location: [facility] ORS;  Service: Vascular;  Laterality: Left;  . DIALYSIS/PERMA CATHETER INSERTION N/A 10/06/2020   Procedure: DIALYSIS/PERMA CATHETER INSERTION;  Surgeon: Algernon Huxley, MD;  Location: [place] [facility];  Service: Cardiovascular;  Laterality: N/A;  . LOOP RECORDER  INSERTION N/A 07/30/2019   Procedure: LOOP RECORDER INSERTION;  Surgeon: Isaias Cowman, MD;  Location: [place] [facility];  Service: Cardiovascular;  Laterality: N/A;  . TEE WITHOUT CARDIOVERSION N/A 04/14/2019   Procedure: TRANSESOPHAGEAL ECHOCARDIOGRAM (TEE);  Surgeon: Teodoro Spray, MD;  Location: [facility] ORS;  Service: Cardiovascular;  Laterality: N/A;    Prior to Admission medications   Medication Sig Start Date End Date Taking? Authorizing Provider  ciprofloxacin (CIPRO) 500 MG tablet Take 1 tablet (500 mg total) by mouth daily for 10 days. 12/05/20 12/15/20 Yes Belen Pesch B, FNP  amLODipine (NORVASC) 10 MG tablet Take 1 tablet (10 mg total) by mouth daily. Patient taking differently: Take 10 mg by mouth every morning. 09/08/20   [name], MD  atorvastatin (LIPITOR) 80 MG tablet Take 1 tablet (80 mg total) by mouth daily. 10/14/20   [name], MD  BESIVANCE 0.6 % SUSP Apply to eye. 09/24/20   [provider]  carvedilol (COREG) 6.25 MG tablet TAKE 1 TABLET (6.25 MG TOTAL) BY MOUTH 2 (TWO) TIMES DAILY WITH A MEAL. 10/07/20   [name], MD  ELIQUIS 2.5 MG TABS tablet Take 1 tablet (2.5 mg total) by mouth 2 (two) times daily. 10/07/20   [name], MD  ezetimibe (ZETIA) 10 MG tablet Take 1 tablet (10 mg total) by mouth daily. 04/22/20   [name], MD  Ferrous Sulfate (IRON) 28 MG TABS Take 28 mg by mouth daily at 12 noon.    [provider]  glucose blood (ACCU-CHEK GUIDE) test strip USE TO CHECK BLOOD SUGARS TWICE DAILY. E11.9 04/22/20   [name], MD  hydrALAZINE (APRESOLINE) 50 MG tablet Take 50 mg by mouth 2 (two) times daily.    [provider]  Lancets Glory Rosebush ULTRASOFT) lancets Use as instructed 12/02/20   [name], MD  linagliptin (TRADJENTA) 5 MG TABS tablet Take 1 tablet (5 mg total) by mouth daily. 10/18/20   [name], MD  losartan (COZAAR) 50 MG tablet Take 50 mg by mouth daily.  09/24/20   [provider]  memantine (NAMENDA) 5 MG tablet Take 5 mg by mouth 2 (two) times daily.    [provider]  Vitamin D, Ergocalciferol, (DRISDOL) 50000 units CAPS capsule Take 50,000 Units by mouth every 30 (thirty) days. 10/05/17   [provider]    Allergies Nsaids  Family History  Problem Relation Age of Onset  . Stroke Mother   . Diabetes Mother   . Heart disease Father   . Kidney disease Sister   . Diabetes Sister   . Kidney disease Brother   . Heart disease Sister   . Diabetes Sister   . Diabetes Sister   . Diabetes Sister   . Kidney disease Sister   . Heart disease Sister   . Kidney disease Brother        kidney transplant  . Early death Brother 84       Truck Accident - died  . Heart disease Brother     Social History Social History   Tobacco Use  . Smoking  status: Never Smoker  . Smokeless tobacco: Never Used  Vaping Use  . Vaping Use: Never used  Substance Use Topics  . Alcohol use: No  . Drug use: No    Review of Systems  Constitutional: No fever/chills Eyes: No visual changes. ENT: No sore throat. Cardiovascular: Denies chest pain. Respiratory: Denies shortness of breath. Gastrointestinal: No abdominal pain.  No nausea, no vomiting.  No diarrhea.  No constipation. Genitourinary: Negative for dysuria. Musculoskeletal: Positive for left arm pain  skin: Positive for erythema at the left [facility] without open wound or drainage. Neurological: Negative for headaches, focal weakness or numbness.  ____________________________________________   PHYSICAL EXAM:  VITAL SIGNS: ED Triage Vitals [12/05/20 0935]  Enc Vitals Group     BP (!) 145/72     Pulse Rate 64     Resp 18     Temp 98.6 F (37 C)     Temp Source Oral     SpO2 98 %     Weight 152 lb (68.9 kg)     Height 5\' 5"  (1.651 m)     Head Circumference      Peak Flow      Pain Score      Pain Loc      Pain Edu?      Excl. in [place]?     Constitutional:  Alert and oriented. Well appearing and in no acute distress. Eyes: Conjunctivae are normal. PERRL. EOMI. Head: Atraumatic. Nose: No congestion/rhinnorhea. Mouth/Throat: Mucous membranes are moist.  Oropharynx non-erythematous. Neck: No stridor.   Hematological/Lymphatic/Immunilogical: No cervical lymphadenopathy. Cardiovascular: Normal rate, regular rhythm. Grossly normal heart sounds.  Good peripheral circulation. Respiratory: Normal respiratory effort.  No retractions. Lungs CTAB. Gastrointestinal: Soft and nontender. No distention. No abdominal bruits. No CVA tenderness. Genitourinary:  Musculoskeletal: No lower extremity tenderness nor edema.  No joint effusions. Neurologic:  Normal speech and language. No gross focal neurologic deficits are appreciated. No gait instability. Skin: Erythema noted at the left [facility] without fluctuance or induration.  Surgical wound is well approximated and without drainage.  Nonpitting peripheral edema noted from the left AC to the fingers of the left hand. Psychiatric: Mood and affect are normal. Speech and behavior are normal.  ____________________________________________   LABS (all labs ordered are listed, but only abnormal results are displayed)  Labs Reviewed  CBC WITH DIFFERENTIAL/PLATELET - Abnormal; Notable for the following components:      Result Value   WBC 3.5 (*)    RBC 3.24 (*)    Hemoglobin 9.9 (*)    HCT 28.6 (*)    Platelets 145 (*)    Lymphs Abs 0.6 (*)    All other components within normal limits  BASIC METABOLIC PANEL - Abnormal; Notable for the following components:   Potassium 3.1 (*)    Glucose, Bld 168 (*)    Creatinine, Ser 3.17 (*)    Calcium 8.6 (*)    GFR, Estimated 14 (*)    All other components within normal limits  PROTIME-INR   ____________________________________________  EKG  Not indicated ____________________________________________  RADIOLOGY  ED MD interpretation:    Ultrasound of the left upper  extremity is negative for acute DVT per radiology.  I, Sherrie George, personally viewed and evaluated these images (plain radiographs) as part of my medical decision making, as well as reviewing the written report by the radiologist.  Official radiology report(s): US Venous Img Upper Uni Left  Result Date: 12/05/2020 CLINICAL DATA:  Pain and swelling after fistula  placement EXAM: Left UPPER EXTREMITY VENOUS DOPPLER ULTRASOUND TECHNIQUE: Gray-scale sonography with graded compression, as well as color Doppler and duplex ultrasound were performed to evaluate the upper extremity deep venous system from the level of the subclavian vein and including the jugular, axillary, basilic, radial, ulnar and upper cephalic vein. Spectral Doppler was utilized to evaluate flow at rest and with distal augmentation maneuvers. COMPARISON:  None. FINDINGS: Contralateral Subclavian Vein: Respiratory phasicity is normal and symmetric with the symptomatic side. No evidence of thrombus. Normal compressibility. Internal Jugular Vein: No evidence of thrombus. Normal compressibility, respiratory phasicity and response to augmentation. Subclavian Vein: No evidence of thrombus. Normal compressibility, respiratory phasicity and response to augmentation. Axillary Vein: No evidence of thrombus. Normal compressibility, respiratory phasicity and response to augmentation. Cephalic Vein: No evidence of thrombus. Normal compressibility, respiratory phasicity and response to augmentation. Basilic Vein: No evidence of thrombus. Normal compressibility, respiratory phasicity and response to augmentation. Brachial Veins: No evidence of thrombus. Normal compressibility, respiratory phasicity and response to augmentation. Radial Veins: No evidence of thrombus. Normal compressibility, respiratory phasicity and response to augmentation. Ulnar Veins: No evidence of thrombus. Normal compressibility, respiratory phasicity and response to augmentation. Venous  Reflux:  None visualized. Other Findings: Limited assessment of the fistula demonstrates arterial waveforms. IMPRESSION: No evidence of DVT within the left upper extremity. Electronically Signed   By: Van Clines M.D.   On: 12/05/2020 14:53    ____________________________________________   PROCEDURES  Procedure(s) performed (including Critical Care):  Procedures  ____________________________________________   INITIAL IMPRESSION / ASSESSMENT AND PLAN     87 year old female presenting to the emergency department for treatment and evaluation of left arm erythema and redness after having fistula placed 3 days ago.  See HPI for further details.  Plan will be to review protocol labs drawn while awaiting ER room assignment and get an ultrasound to rule out DVT.  DIFFERENTIAL DIAGNOSIS  Cellulitis, hematoma, DVT, thrombophlebitis  ED COURSE  Labs, vital signs, and ultrasound are all reassuring.  Plan will be to touch base with vascular service.  Dr. Feliberto Gottron with vascular consulted. Recommends Ciprofloxacin and follow up in clinic next week. Pharmacy consulted regarding renal dosing. Will prescribe 500mg  daily.   Patient discharged with instruction to call for follow up appointment and advised to return to the ER for symptoms that change or worsen or for new concerns.    ___________________________________________   FINAL CLINICAL IMPRESSION(S) / ED DIAGNOSES  Final diagnoses:  Cellulitis of left upper extremity     ED Discharge Orders         Ordered    ciprofloxacin (CIPRO) 500 MG tablet  Daily        12/05/20 1551           Brianna Dodson was evaluated in Emergency Department on 12/05/2020 for the symptoms described in the history of present illness. She was evaluated in the context of the global COVID-19 pandemic, which necessitated consideration that the patient might be at risk for infection with the SARS-CoV-2 virus that causes COVID-19. Institutional protocols  and algorithms that pertain to the evaluation of patients at risk for COVID-19 are in a state of rapid change based on information released by regulatory bodies including the CDC and federal and state organizations. These policies and algorithms were followed during the patient's care in the ED.   Note:  This document was prepared using Dragon voice recognition software and may include unintentional dictation errors.   Victorino Dike, FNP 12/05/20 1920  Lucrezia Starch, MD 12/06/20 1355

## 2020-12-05 NOTE — ED Notes (Signed)
Attempted to call pt sister to come in to take pt home. No answer.

## 2020-12-05 NOTE — Discharge Instructions (Signed)
Please call and schedule a follow up with Dr. [name] or one of his associates next week.  Return to the ER if you notice increase in swelling, pain, or other concerning symptoms.

## 2020-12-05 NOTE — ED Notes (Signed)
First Nurse Note: Pt had dialysis catheter placed on Thursday in her left arm and it is very swollen today per pt sister. Pt is in NAD.

## 2020-12-06 ENCOUNTER — Other Ambulatory Visit: Payer: Self-pay | Admitting: Family Medicine

## 2020-12-06 ENCOUNTER — Telehealth: Payer: Self-pay | Admitting: Family Medicine

## 2020-12-06 DIAGNOSIS — N186 End stage renal disease: Secondary | ICD-10-CM | POA: Diagnosis not present

## 2020-12-06 DIAGNOSIS — D509 Iron deficiency anemia, unspecified: Secondary | ICD-10-CM | POA: Diagnosis not present

## 2020-12-06 DIAGNOSIS — N2581 Secondary hyperparathyroidism of renal origin: Secondary | ICD-10-CM | POA: Diagnosis not present

## 2020-12-06 DIAGNOSIS — Z23 Encounter for immunization: Secondary | ICD-10-CM | POA: Diagnosis not present

## 2020-12-06 DIAGNOSIS — D631 Anemia in chronic kidney disease: Secondary | ICD-10-CM | POA: Diagnosis not present

## 2020-12-06 DIAGNOSIS — Z992 Dependence on renal dialysis: Secondary | ICD-10-CM | POA: Diagnosis not present

## 2020-12-06 MED ORDER — ACCU-CHEK GUIDE VI STRP: ORAL_STRIP | 0 refills | Status: DC

## 2020-12-06 NOTE — Telephone Encounter (Signed)
PTs daughter called in to advise that she is calling in about the glucose blood (ACCU-CHEK GUIDE) test strip. She got a phone call last week and was returning that as well in regards to it.

## 2020-12-06 NOTE — Telephone Encounter (Signed)
Both have been refilled.

## 2020-12-06 NOTE — Telephone Encounter (Signed)
Patient's sister is calling to ask for Dr Caryl Bis to prescribe a gentle laxative.  Also patient needs her glucose blood (ACCU-CHEK GUIDE) test strip refilled. They have been trying for 2 weeks to get filled.

## 2020-12-07 ENCOUNTER — Other Ambulatory Visit (INDEPENDENT_AMBULATORY_CARE_PROVIDER_SITE_OTHER): Payer: Self-pay | Admitting: Vascular Surgery

## 2020-12-07 ENCOUNTER — Other Ambulatory Visit: Payer: Self-pay

## 2020-12-07 ENCOUNTER — Encounter (INDEPENDENT_AMBULATORY_CARE_PROVIDER_SITE_OTHER): Payer: Self-pay | Admitting: Nurse Practitioner

## 2020-12-07 ENCOUNTER — Ambulatory Visit (INDEPENDENT_AMBULATORY_CARE_PROVIDER_SITE_OTHER): Payer: Medicare Other

## 2020-12-07 ENCOUNTER — Ambulatory Visit (INDEPENDENT_AMBULATORY_CARE_PROVIDER_SITE_OTHER): Payer: Medicare Other | Admitting: Nurse Practitioner

## 2020-12-07 VITALS — BP 137/65 | HR 72 | Resp 16 | Wt 152.8 lb

## 2020-12-07 DIAGNOSIS — E440 Moderate protein-calorie malnutrition: Secondary | ICD-10-CM | POA: Diagnosis not present

## 2020-12-07 DIAGNOSIS — I120 Hypertensive chronic kidney disease with stage 5 chronic kidney disease or end stage renal disease: Secondary | ICD-10-CM | POA: Diagnosis not present

## 2020-12-07 DIAGNOSIS — Z9889 Other specified postprocedural states: Secondary | ICD-10-CM

## 2020-12-07 DIAGNOSIS — I69318 Other symptoms and signs involving cognitive functions following cerebral infarction: Secondary | ICD-10-CM | POA: Diagnosis not present

## 2020-12-07 DIAGNOSIS — N2581 Secondary hyperparathyroidism of renal origin: Secondary | ICD-10-CM | POA: Diagnosis not present

## 2020-12-07 DIAGNOSIS — R600 Localized edema: Secondary | ICD-10-CM

## 2020-12-07 DIAGNOSIS — Z741 Need for assistance with personal care: Secondary | ICD-10-CM | POA: Diagnosis not present

## 2020-12-07 DIAGNOSIS — N186 End stage renal disease: Secondary | ICD-10-CM

## 2020-12-07 DIAGNOSIS — Z9181 History of falling: Secondary | ICD-10-CM | POA: Diagnosis not present

## 2020-12-07 DIAGNOSIS — M7989 Other specified soft tissue disorders: Secondary | ICD-10-CM

## 2020-12-07 DIAGNOSIS — I6529 Occlusion and stenosis of unspecified carotid artery: Secondary | ICD-10-CM | POA: Diagnosis not present

## 2020-12-07 DIAGNOSIS — E1122 Type 2 diabetes mellitus with diabetic chronic kidney disease: Secondary | ICD-10-CM | POA: Diagnosis not present

## 2020-12-07 DIAGNOSIS — Z992 Dependence on renal dialysis: Secondary | ICD-10-CM | POA: Diagnosis not present

## 2020-12-07 DIAGNOSIS — L03114 Cellulitis of left upper limb: Secondary | ICD-10-CM | POA: Diagnosis not present

## 2020-12-07 DIAGNOSIS — F0150 Vascular dementia without behavioral disturbance: Secondary | ICD-10-CM | POA: Diagnosis not present

## 2020-12-07 DIAGNOSIS — F0280 Dementia in other diseases classified elsewhere without behavioral disturbance: Secondary | ICD-10-CM | POA: Diagnosis not present

## 2020-12-07 DIAGNOSIS — D631 Anemia in chronic kidney disease: Secondary | ICD-10-CM | POA: Diagnosis not present

## 2020-12-07 DIAGNOSIS — I4891 Unspecified atrial fibrillation: Secondary | ICD-10-CM | POA: Diagnosis not present

## 2020-12-07 DIAGNOSIS — E041 Nontoxic single thyroid nodule: Secondary | ICD-10-CM | POA: Diagnosis not present

## 2020-12-07 DIAGNOSIS — G309 Alzheimer's disease, unspecified: Secondary | ICD-10-CM | POA: Diagnosis not present

## 2020-12-07 DIAGNOSIS — Z7902 Long term (current) use of antithrombotics/antiplatelets: Secondary | ICD-10-CM | POA: Diagnosis not present

## 2020-12-07 DIAGNOSIS — G479 Sleep disorder, unspecified: Secondary | ICD-10-CM | POA: Diagnosis not present

## 2020-12-07 DIAGNOSIS — Z48812 Encounter for surgical aftercare following surgery on the circulatory system: Secondary | ICD-10-CM | POA: Diagnosis not present

## 2020-12-07 DIAGNOSIS — Z7984 Long term (current) use of oral hypoglycemic drugs: Secondary | ICD-10-CM | POA: Diagnosis not present

## 2020-12-07 DIAGNOSIS — E559 Vitamin D deficiency, unspecified: Secondary | ICD-10-CM | POA: Diagnosis not present

## 2020-12-07 DIAGNOSIS — I10 Essential (primary) hypertension: Secondary | ICD-10-CM

## 2020-12-07 DIAGNOSIS — D72819 Decreased white blood cell count, unspecified: Secondary | ICD-10-CM | POA: Diagnosis not present

## 2020-12-07 NOTE — Telephone Encounter (Signed)
What issue is she having that requires a laxative? Has she been constipated? How often is she having a BM? Any other symptoms with this?

## 2020-12-08 ENCOUNTER — Telehealth (INDEPENDENT_AMBULATORY_CARE_PROVIDER_SITE_OTHER): Payer: Self-pay

## 2020-12-08 DIAGNOSIS — N2581 Secondary hyperparathyroidism of renal origin: Secondary | ICD-10-CM | POA: Diagnosis not present

## 2020-12-08 DIAGNOSIS — D509 Iron deficiency anemia, unspecified: Secondary | ICD-10-CM | POA: Diagnosis not present

## 2020-12-08 DIAGNOSIS — I120 Hypertensive chronic kidney disease with stage 5 chronic kidney disease or end stage renal disease: Secondary | ICD-10-CM | POA: Diagnosis not present

## 2020-12-08 DIAGNOSIS — L03114 Cellulitis of left upper limb: Secondary | ICD-10-CM | POA: Diagnosis not present

## 2020-12-08 DIAGNOSIS — E1122 Type 2 diabetes mellitus with diabetic chronic kidney disease: Secondary | ICD-10-CM | POA: Diagnosis not present

## 2020-12-08 DIAGNOSIS — Z992 Dependence on renal dialysis: Secondary | ICD-10-CM | POA: Diagnosis not present

## 2020-12-08 DIAGNOSIS — Z48812 Encounter for surgical aftercare following surgery on the circulatory system: Secondary | ICD-10-CM | POA: Diagnosis not present

## 2020-12-08 DIAGNOSIS — Z23 Encounter for immunization: Secondary | ICD-10-CM | POA: Diagnosis not present

## 2020-12-08 DIAGNOSIS — D631 Anemia in chronic kidney disease: Secondary | ICD-10-CM | POA: Diagnosis not present

## 2020-12-08 DIAGNOSIS — N186 End stage renal disease: Secondary | ICD-10-CM | POA: Diagnosis not present

## 2020-12-08 NOTE — Telephone Encounter (Signed)
Brianna Dodson from [facility] left a voicemail stating that she was not able to hear or feel the thrill to fistula. The nurse was made aware that our providers are aware and the patient will recheck in 6 weeks.

## 2020-12-08 NOTE — Telephone Encounter (Signed)
Patient has been notified

## 2020-12-08 NOTE — Telephone Encounter (Signed)
If she is not constipated a laxative that is not going to help.  It may be possible that her bowel movements have decreased because she is not taking in as much food.  Please see if there is a reason she is not eating as much.  She could always try adding a fiber supplement to see if that would help.

## 2020-12-08 NOTE — Telephone Encounter (Signed)
Patient is having BM but they are happening days in between. She stated she is not constipated, she doesn't eat a lot either. She stated she is not having any other sx. Patient was talking to her granddaughter then disconnected call.

## 2020-12-10 DIAGNOSIS — I120 Hypertensive chronic kidney disease with stage 5 chronic kidney disease or end stage renal disease: Secondary | ICD-10-CM | POA: Diagnosis not present

## 2020-12-10 DIAGNOSIS — Z23 Encounter for immunization: Secondary | ICD-10-CM | POA: Diagnosis not present

## 2020-12-10 DIAGNOSIS — Z48812 Encounter for surgical aftercare following surgery on the circulatory system: Secondary | ICD-10-CM | POA: Diagnosis not present

## 2020-12-10 DIAGNOSIS — D509 Iron deficiency anemia, unspecified: Secondary | ICD-10-CM | POA: Diagnosis not present

## 2020-12-10 DIAGNOSIS — N186 End stage renal disease: Secondary | ICD-10-CM | POA: Diagnosis not present

## 2020-12-10 DIAGNOSIS — N2581 Secondary hyperparathyroidism of renal origin: Secondary | ICD-10-CM | POA: Diagnosis not present

## 2020-12-10 DIAGNOSIS — D631 Anemia in chronic kidney disease: Secondary | ICD-10-CM | POA: Diagnosis not present

## 2020-12-10 DIAGNOSIS — L03114 Cellulitis of left upper limb: Secondary | ICD-10-CM | POA: Diagnosis not present

## 2020-12-10 DIAGNOSIS — Z992 Dependence on renal dialysis: Secondary | ICD-10-CM | POA: Diagnosis not present

## 2020-12-10 DIAGNOSIS — E1122 Type 2 diabetes mellitus with diabetic chronic kidney disease: Secondary | ICD-10-CM | POA: Diagnosis not present

## 2020-12-13 DIAGNOSIS — N2581 Secondary hyperparathyroidism of renal origin: Secondary | ICD-10-CM | POA: Diagnosis not present

## 2020-12-13 DIAGNOSIS — Z992 Dependence on renal dialysis: Secondary | ICD-10-CM | POA: Diagnosis not present

## 2020-12-13 DIAGNOSIS — N186 End stage renal disease: Secondary | ICD-10-CM | POA: Diagnosis not present

## 2020-12-13 DIAGNOSIS — Z23 Encounter for immunization: Secondary | ICD-10-CM | POA: Diagnosis not present

## 2020-12-13 DIAGNOSIS — D631 Anemia in chronic kidney disease: Secondary | ICD-10-CM | POA: Diagnosis not present

## 2020-12-13 DIAGNOSIS — D509 Iron deficiency anemia, unspecified: Secondary | ICD-10-CM | POA: Diagnosis not present

## 2020-12-14 DIAGNOSIS — D631 Anemia in chronic kidney disease: Secondary | ICD-10-CM | POA: Diagnosis not present

## 2020-12-14 DIAGNOSIS — N186 End stage renal disease: Secondary | ICD-10-CM | POA: Diagnosis not present

## 2020-12-14 DIAGNOSIS — I120 Hypertensive chronic kidney disease with stage 5 chronic kidney disease or end stage renal disease: Secondary | ICD-10-CM | POA: Diagnosis not present

## 2020-12-14 DIAGNOSIS — E1122 Type 2 diabetes mellitus with diabetic chronic kidney disease: Secondary | ICD-10-CM | POA: Diagnosis not present

## 2020-12-14 DIAGNOSIS — L03114 Cellulitis of left upper limb: Secondary | ICD-10-CM | POA: Diagnosis not present

## 2020-12-14 DIAGNOSIS — Z48812 Encounter for surgical aftercare following surgery on the circulatory system: Secondary | ICD-10-CM | POA: Diagnosis not present

## 2020-12-15 DIAGNOSIS — D631 Anemia in chronic kidney disease: Secondary | ICD-10-CM | POA: Diagnosis not present

## 2020-12-15 DIAGNOSIS — Z23 Encounter for immunization: Secondary | ICD-10-CM | POA: Diagnosis not present

## 2020-12-15 DIAGNOSIS — N186 End stage renal disease: Secondary | ICD-10-CM | POA: Diagnosis not present

## 2020-12-15 DIAGNOSIS — Z992 Dependence on renal dialysis: Secondary | ICD-10-CM | POA: Diagnosis not present

## 2020-12-15 DIAGNOSIS — N2581 Secondary hyperparathyroidism of renal origin: Secondary | ICD-10-CM | POA: Diagnosis not present

## 2020-12-15 DIAGNOSIS — D509 Iron deficiency anemia, unspecified: Secondary | ICD-10-CM | POA: Diagnosis not present

## 2020-12-16 DIAGNOSIS — Z48812 Encounter for surgical aftercare following surgery on the circulatory system: Secondary | ICD-10-CM | POA: Diagnosis not present

## 2020-12-16 DIAGNOSIS — I120 Hypertensive chronic kidney disease with stage 5 chronic kidney disease or end stage renal disease: Secondary | ICD-10-CM | POA: Diagnosis not present

## 2020-12-16 DIAGNOSIS — L03114 Cellulitis of left upper limb: Secondary | ICD-10-CM | POA: Diagnosis not present

## 2020-12-16 DIAGNOSIS — N186 End stage renal disease: Secondary | ICD-10-CM | POA: Diagnosis not present

## 2020-12-16 DIAGNOSIS — D631 Anemia in chronic kidney disease: Secondary | ICD-10-CM | POA: Diagnosis not present

## 2020-12-16 DIAGNOSIS — E1122 Type 2 diabetes mellitus with diabetic chronic kidney disease: Secondary | ICD-10-CM | POA: Diagnosis not present

## 2020-12-16 NOTE — Anesthesia Postprocedure Evaluation (Signed)
Anesthesia Post Note  Patient: Forensic scientist  Procedure(s) Performed: INSERTION OF ARTERIOVENOUS (AV)  FISTULA  ARM ( BRACHIO- CEPHALIC) (Left)  Patient location during evaluation: PACU Anesthesia Type: Regional Level of consciousness: awake and alert Pain management: pain level controlled Vital Signs Assessment: post-procedure vital signs reviewed and stable Respiratory status: spontaneous breathing, nonlabored ventilation and respiratory function stable Cardiovascular status: blood pressure returned to baseline and stable Postop Assessment: no apparent nausea or vomiting Anesthetic complications: no   No notable events documented.   Last Vitals:  Vitals:   12/02/20 1527 12/02/20 1537  BP:  (!) 147/64  Pulse:  (!) 57  Resp:  16  Temp: (!) 36.4 C 36.4 C  SpO2:  97%    Last Pain:  Vitals:   12/03/20 0809  TempSrc:   PainSc: 0-No pain                 Alphonsus Sias

## 2020-12-17 DIAGNOSIS — Z23 Encounter for immunization: Secondary | ICD-10-CM | POA: Diagnosis not present

## 2020-12-17 DIAGNOSIS — D631 Anemia in chronic kidney disease: Secondary | ICD-10-CM | POA: Diagnosis not present

## 2020-12-17 DIAGNOSIS — D509 Iron deficiency anemia, unspecified: Secondary | ICD-10-CM | POA: Diagnosis not present

## 2020-12-17 DIAGNOSIS — N186 End stage renal disease: Secondary | ICD-10-CM | POA: Diagnosis not present

## 2020-12-17 DIAGNOSIS — Z992 Dependence on renal dialysis: Secondary | ICD-10-CM | POA: Diagnosis not present

## 2020-12-17 DIAGNOSIS — N2581 Secondary hyperparathyroidism of renal origin: Secondary | ICD-10-CM | POA: Diagnosis not present

## 2020-12-19 ENCOUNTER — Encounter (INDEPENDENT_AMBULATORY_CARE_PROVIDER_SITE_OTHER): Payer: Self-pay | Admitting: Nurse Practitioner

## 2020-12-19 NOTE — Progress Notes (Signed)
Subjective:    [name], female    DOB: December 29, 1933, 87 y.o.   MRN: [id number] Chief Complaint  Patient presents with  . Follow-up    [facility] 5wk post insertion of a/v fistula    Brianna Dodson is an 88 year old female that presents today after recent left brachiocephalic AV fistula creation on 12/20/2020 the patient had severe swelling of her left upper extremity postsurgery.  She also had pain and discomfort.  Today the swelling has decreased however it is still noticeable.  She denies any coldness of her fingertips she denies any pain in the upper extremity.  No evidence of fever or infection.  No drainage is noted.  Today noninvasive studies show the brachiocephalic AV fistula is patent however there is dampened flow throughout the outflow vein and focal vessel narrowing near the anastomosis.  The visualization is limited.  Cubital fossa outflow vein due to edematous surrounding tissue.  Review of Systems  Cardiovascular:        Upper extremity swelling  All other systems reviewed and are negative.     Objective:   Physical Exam Vitals reviewed.  HENT:     Head: Normocephalic.  Cardiovascular:     Rate and Rhythm: Normal rate.     Pulses:          Radial pulses are 2+ on the left side.  Pulmonary:     Effort: Pulmonary effort is normal.  Musculoskeletal:        General: Swelling (Left upper extremity) present.  Neurological:     Mental Status: She is alert and oriented to person, place, and time. Mental status is at baseline.     Comments: Aphasia  Psychiatric:        Mood and Affect: Mood normal.        Behavior: Behavior normal.        Thought Content: Thought content normal.        Judgment: Judgment normal.    BP 137/65 (BP Location: Right Arm)   Pulse 72   Resp 16   Wt 152 lb 12.8 oz (69.3 kg)   BMI 25.43 kg/m   Past Medical History:  Diagnosis Date  . A-fib ([place])   . Alzheimer disease ([place])   . Anemia   . Carotid stenosis   . Cerebral  infarction ([place]) 04/12/2019   Multiple new foci of acute infarction affect the RIGHT hemisphere affecting the frontal, posterior frontal, posterior temporal, anterior parietal cortex and regional white matter.  . Cerebral infarction due to embolism of right middle cerebral artery ([place]) 08/20/2019   RIGHT MCA distal M1  . Chronic anticoagulation    Apixaban  . CKD (chronic kidney disease), stage 4(HCC)   . Embolic stroke involving cerebral artery ([place]) 03/15/2019   RIGHT MCA/PCA territory  . Encephalomalacia    RIGHT posterior MCA territory  . HCV (hepatitis C virus)   . History of blood transfusion   . Hyperlipidemia   . Hypertension   . OSA (obstructive sleep apnea)   . Status post placement of implantable loop recorder 07/30/2019  . T2DM (type 2 diabetes mellitus) ([place])     Social History   Socioeconomic History  . Marital status: Married    Spouse name: Not on file  . Number of children: 2  . Years of education: Not on file  . Highest education level: Not on file  Occupational History  . 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 Retired  Tobacco  Use  . Smoking status: Never  . Smokeless tobacco: Never  Vaping Use  . Vaping Use: Never used  Substance and Sexual Activity  . Alcohol use: No  . Drug use: No  . Sexual activity: Not Currently  Other Topics Concern  . Not on file  Social History Narrative   She is married and has two adult children (adopted).    Hobbies: Decorating   Exercise: None   Caffeine: 4 cups a week   Social Determinants of Health   Financial Resource Strain: Low Risk   . Difficulty of Paying Living Expenses: Not hard at all  Food Insecurity: Not on file  Transportation Needs: Not on file  Physical Activity: Not on file  Stress: Not on file  Social Connections: Not on file  Intimate Partner Violence: Not on file    Past Surgical History:  Procedure Laterality Date  . ABDOMINAL HYSTERECTOMY  1985  . AV FISTULA  PLACEMENT Left 12/02/2020   Procedure: INSERTION OF ARTERIOVENOUS (AV)  FISTULA  ARM ( BRACHIO- CEPHALIC);  Surgeon: Algernon Huxley, MD;  Location: [facility];  Service: Vascular;  Laterality: Left;  . DIALYSIS/PERMA CATHETER INSERTION N/A 10/06/2020   Procedure: DIALYSIS/PERMA CATHETER INSERTION;  Surgeon: Algernon Huxley, MD;  Location: [place] [facility];  Service: Cardiovascular;  Laterality: N/A;  . LOOP RECORDER INSERTION N/A 07/30/2019   Procedure: LOOP RECORDER INSERTION;  Surgeon: Isaias Cowman, MD;  Location: [place] [facility];  Service: Cardiovascular;  Laterality: N/A;  . TEE WITHOUT CARDIOVERSION N/A 04/14/2019   Procedure: TRANSESOPHAGEAL ECHOCARDIOGRAM (TEE);  Surgeon: Teodoro Spray, MD;  Location: [facility];  Service: Cardiovascular;  Laterality: N/A;    Family History  Problem Relation Age of Onset  . Stroke Mother   . Diabetes Mother   . Heart disease Father   . Kidney disease Sister   . Diabetes Sister   . Kidney disease Brother   . Heart disease Sister   . Diabetes Sister   . Diabetes Sister   . Diabetes Sister   . Kidney disease Sister   . Heart disease Sister   . Kidney disease Brother        kidney transplant  . Early death Brother 2       Truck Accident - died  . Heart disease Brother     Allergies  Allergen Reactions  . Nsaids     CKD stage III - Avoid all nephrotoxic drugs    CBC Latest Ref Rng & Units 12/05/2020 12/02/2020 11/30/2020  WBC 4.0 - 10.5 K/uL 3.5(L) - 3.1(L)  Hemoglobin 12.0 - 15.0 g/dL 9.9(L) 11.2(L) 10.1(L)  Hematocrit 36.0 - 46.0 % 28.6(L) 33.0(L) 30.4(L)  Platelets 150 - 400 K/uL 145(L) - 135(L)      CMP     Component Value Date/Time   NA 138 12/05/2020 0948   NA 142 11/10/2013 0522   K 3.1 (L) 12/05/2020 0948   K 4.6 11/10/2013 0522   CL 99 12/05/2020 0948   CL 110 (H) 11/10/2013 0522   CO2 28 12/05/2020 0948   CO2 27 11/10/2013 0522   GLUCOSE 168 (H) 12/05/2020 0948   GLUCOSE 70 11/10/2013 0522   BUN 14 12/05/2020  0948   BUN 22 (H) 11/10/2013 0522   CREATININE 3.17 (H) 12/05/2020 0948   CREATININE 1.12 11/10/2013 0522   CALCIUM 8.6 (L) 12/05/2020 0948   CALCIUM 8.4 (L) 11/10/2013 0522   PROT 7.2 06/09/2020 1145   PROT 9.0 (H) 11/09/2013 1540  ALBUMIN 3.9 06/09/2020 1145   ALBUMIN 3.8 11/09/2013 1540   AST 21 06/09/2020 1145   AST 29 11/09/2013 1540   ALT 20 06/09/2020 1145   ALT 27 11/09/2013 1540   ALKPHOS 48 06/09/2020 1145   ALKPHOS 59 11/09/2013 1540   BILITOT 0.6 06/09/2020 1145   BILITOT 0.2 11/09/2013 1540   GFRNONAA 14 (L) 12/05/2020 0948   GFRNONAA 47 (L) 11/10/2013 0522   GFRAA 27 (L) 08/22/2019 0449   GFRAA 54 (L) 11/10/2013 0522     No results found.     Assessment & Plan:   1. ESRD (end stage renal disease) ([place]) All some flows detected within the access was extremely dampened.  Visualization of certain areas are limited due to the severely edematous areas.  We will have the patient return in 3 weeks for repeat noninvasive studies and discussion about neck steps if necessary.  2. Left arm swelling The patient's upper extremity edema is decreasing.  Concern for possible central vein stenosis.  Patient is advised to elevate her upper extremity to help allow for decrease in swelling.  3. Essential hypertension Continue antihypertensive medications as already ordered, these medications have been reviewed and there are no changes at this time.    Current Outpatient Medications on File Prior to Visit  Medication Sig Dispense Refill  . amLODipine (NORVASC) 10 MG tablet Take 1 tablet (10 mg total) by mouth daily. (Patient taking differently: Take 10 mg by mouth every morning.) 90 tablet 0  . atorvastatin (LIPITOR) 80 MG tablet Take 1 tablet (80 mg total) by mouth daily. 90 tablet 3  . BESIVANCE 0.6 % SUSP Apply to eye.    . carvedilol (COREG) 6.25 MG tablet TAKE 1 TABLET (6.25 MG TOTAL) BY MOUTH 2 (TWO) TIMES DAILY WITH A MEAL. 180 tablet 1  . ELIQUIS 2.5 MG TABS tablet Take  1 tablet (2.5 mg total) by mouth 2 (two) times daily. 56 tablet 0  . ezetimibe (ZETIA) 10 MG tablet TAKE 1 TABLET BY MOUTH EVERY DAY 90 tablet 3  . Ferrous Sulfate (IRON) 28 MG TABS Take 28 mg by mouth daily at 12 noon.    Marland Kitchen glucose blood (ACCU-CHEK GUIDE) test strip USE TO CHECK BLOOD SUGARS TWICE DAILY. E11.9 100 each 0  . hydrALAZINE (APRESOLINE) 50 MG tablet Take 50 mg by mouth 2 (two) times daily.    . Lancets (ONETOUCH ULTRASOFT) lancets Use as instructed 100 each 6  . linagliptin (TRADJENTA) 5 MG TABS tablet Take 1 tablet (5 mg total) by mouth daily. 28 tablet 0  . losartan (COZAAR) 50 MG tablet Take 50 mg by mouth daily.    . memantine (NAMENDA) 5 MG tablet Take 5 mg by mouth 2 (two) times daily.    . Vitamin D, Ergocalciferol, (DRISDOL) 50000 units CAPS capsule Take 50,000 Units by mouth every 30 (thirty) days.  1   No current facility-administered medications on file prior to visit.    There are no Patient Instructions on file for this visit. No follow-ups on file.   Kris Hartmann, NP

## 2020-12-20 ENCOUNTER — Telehealth: Payer: Self-pay | Admitting: Family Medicine

## 2020-12-20 DIAGNOSIS — Z992 Dependence on renal dialysis: Secondary | ICD-10-CM | POA: Diagnosis not present

## 2020-12-20 DIAGNOSIS — D509 Iron deficiency anemia, unspecified: Secondary | ICD-10-CM | POA: Diagnosis not present

## 2020-12-20 DIAGNOSIS — N186 End stage renal disease: Secondary | ICD-10-CM | POA: Diagnosis not present

## 2020-12-20 DIAGNOSIS — N2581 Secondary hyperparathyroidism of renal origin: Secondary | ICD-10-CM | POA: Diagnosis not present

## 2020-12-20 DIAGNOSIS — Z23 Encounter for immunization: Secondary | ICD-10-CM | POA: Diagnosis not present

## 2020-12-20 DIAGNOSIS — D631 Anemia in chronic kidney disease: Secondary | ICD-10-CM | POA: Diagnosis not present

## 2020-12-20 NOTE — Telephone Encounter (Signed)
PT called to inquire about seeing if we have any samples of linagliptin (TRADJENTA) 5 MG TABS tablet available. PT would like to be contacted if so.

## 2020-12-20 NOTE — Telephone Encounter (Signed)
I'll prepare another supply of samples.   Gae Bon, please let patient know it is available for pick up. Please ensure the patient or her sister, [name], will be available to talk for our upcoming appointment (Thursday, 6/23 at 2:30 pm). Her granddaughter is not on her DPR so I will not talk with her if patient is not present on the phone.

## 2020-12-21 ENCOUNTER — Ambulatory Visit (INDEPENDENT_AMBULATORY_CARE_PROVIDER_SITE_OTHER): Payer: Medicare Other | Admitting: Nurse Practitioner

## 2020-12-21 ENCOUNTER — Encounter (INDEPENDENT_AMBULATORY_CARE_PROVIDER_SITE_OTHER): Payer: Self-pay | Admitting: Nurse Practitioner

## 2020-12-21 ENCOUNTER — Other Ambulatory Visit: Payer: Self-pay

## 2020-12-21 VITALS — BP 144/63 | HR 68 | Resp 16 | Wt 149.6 lb

## 2020-12-21 DIAGNOSIS — I10 Essential (primary) hypertension: Secondary | ICD-10-CM

## 2020-12-21 DIAGNOSIS — Z48812 Encounter for surgical aftercare following surgery on the circulatory system: Secondary | ICD-10-CM | POA: Diagnosis not present

## 2020-12-21 DIAGNOSIS — L03114 Cellulitis of left upper limb: Secondary | ICD-10-CM | POA: Diagnosis not present

## 2020-12-21 DIAGNOSIS — N186 End stage renal disease: Secondary | ICD-10-CM | POA: Diagnosis not present

## 2020-12-21 DIAGNOSIS — D631 Anemia in chronic kidney disease: Secondary | ICD-10-CM | POA: Diagnosis not present

## 2020-12-21 DIAGNOSIS — E1122 Type 2 diabetes mellitus with diabetic chronic kidney disease: Secondary | ICD-10-CM | POA: Diagnosis not present

## 2020-12-21 DIAGNOSIS — I120 Hypertensive chronic kidney disease with stage 5 chronic kidney disease or end stage renal disease: Secondary | ICD-10-CM | POA: Diagnosis not present

## 2020-12-21 NOTE — Telephone Encounter (Signed)
I called and spoke with the patients sister and informed her that the patients samples are ready for pickup and she understood. I also informed the sister that catie has appointment with her on Thursday and she will only talk with the sister or the patient not the granddaughter and they understood. Ikia Cincotta,cma

## 2020-12-22 DIAGNOSIS — N186 End stage renal disease: Secondary | ICD-10-CM | POA: Diagnosis not present

## 2020-12-22 DIAGNOSIS — Z992 Dependence on renal dialysis: Secondary | ICD-10-CM | POA: Diagnosis not present

## 2020-12-22 DIAGNOSIS — D509 Iron deficiency anemia, unspecified: Secondary | ICD-10-CM | POA: Diagnosis not present

## 2020-12-22 DIAGNOSIS — D631 Anemia in chronic kidney disease: Secondary | ICD-10-CM | POA: Diagnosis not present

## 2020-12-22 DIAGNOSIS — N2581 Secondary hyperparathyroidism of renal origin: Secondary | ICD-10-CM | POA: Diagnosis not present

## 2020-12-22 DIAGNOSIS — Z23 Encounter for immunization: Secondary | ICD-10-CM | POA: Diagnosis not present

## 2020-12-22 NOTE — Telephone Encounter (Signed)
Samples have been picked up by patient.

## 2020-12-23 ENCOUNTER — Ambulatory Visit: Payer: Medicare Other | Admitting: Pharmacist

## 2020-12-23 ENCOUNTER — Other Ambulatory Visit: Payer: Self-pay | Admitting: Family Medicine

## 2020-12-23 DIAGNOSIS — E1122 Type 2 diabetes mellitus with diabetic chronic kidney disease: Secondary | ICD-10-CM

## 2020-12-23 DIAGNOSIS — E785 Hyperlipidemia, unspecified: Secondary | ICD-10-CM

## 2020-12-23 DIAGNOSIS — I10 Essential (primary) hypertension: Secondary | ICD-10-CM

## 2020-12-23 DIAGNOSIS — N186 End stage renal disease: Secondary | ICD-10-CM

## 2020-12-23 DIAGNOSIS — E559 Vitamin D deficiency, unspecified: Secondary | ICD-10-CM

## 2020-12-23 DIAGNOSIS — F0280 Dementia in other diseases classified elsewhere without behavioral disturbance: Secondary | ICD-10-CM

## 2020-12-23 DIAGNOSIS — I480 Paroxysmal atrial fibrillation: Secondary | ICD-10-CM

## 2020-12-23 DIAGNOSIS — I6930 Unspecified sequelae of cerebral infarction: Secondary | ICD-10-CM

## 2020-12-23 DIAGNOSIS — F0150 Vascular dementia without behavioral disturbance: Secondary | ICD-10-CM

## 2020-12-23 MED ORDER — LINAGLIPTIN 5 MG PO TABS: 5.0000 mg | ORAL_TABLET | Freq: Every day | ORAL | 3 refills | Status: DC

## 2020-12-23 NOTE — Chronic Care Management (AMB) (Signed)
Care Management   Pharmacy Note  12/23/2020 Name: Brianna Dodson MRN: [id number] DOB: Jan 19, 1934  Subjective: Brianna Dodson is a 87 y.o. year old female who is a primary care patient of Brianna Bis Angela Adam, MD. The Care Management team was consulted for assistance with care management and care coordination needs.    Engaged with patient by telephone for follow up visit in response to provider referral for pharmacy case management and/or care coordination services. Spoke with her and her sister, Brianna Dodson (on [place])  The patient was given information about Care Management services today including:  Care Management services includes personalized support from designated clinical staff supervised by the patient's primary care provider, including individualized plan of care and coordination with other care providers. 24/7 contact phone numbers for assistance for urgent and routine care needs. The patient may stop case management services at any time by phone call to the office staff.  Patient agreed to services and consent obtained.  Assessment:  Review of patient status, including review of consultants reports, laboratory and other test data, was performed as part of comprehensive evaluation and provision of chronic care management services.   SDOH (Social Determinants of Health) assessments and interventions performed:  SDOH Interventions    Flowsheet Row Most Recent Value  SDOH Interventions   Financial Strain Interventions Intervention Not Indicated        Objective:  Lab Results  Component Value Date   CREATININE 3.17 (H) 12/05/2020   CREATININE 2.60 (H) 12/02/2020   CREATININE 2.93 (H) 11/30/2020    Lab Results  Component Value Date   HGBA1C 6.8 (H) 09/11/2020       Component Value Date/Time   CHOL 87 09/11/2020 0223   TRIG 95 09/11/2020 0223   HDL 38 (L) 09/11/2020 0223   CHOLHDL 2.3 09/11/2020 0223   VLDL 19 09/11/2020 0223   LDLCALC 30 09/11/2020 0223   LDLDIRECT  98.0 01/24/2018 0912    Clinical ASCVD: Yes  The ASCVD Risk score Brianna Dodson., et al., 2013) failed to calculate for the following reasons:   The 2013 ASCVD risk score is only valid for ages 65 to 67   The patient has a prior MI or stroke diagnosis      BP Readings from Last 3 Encounters:  12/21/20 (!) 144/63  12/07/20 137/65  12/05/20 (!) 158/69    Care Plan  Allergies  Allergen Reactions   Nsaids     CKD stage III - Avoid all nephrotoxic drugs    Medications Reviewed Today     Reviewed by De Hollingshead, RPH-CPP (Pharmacist) on 12/23/20 at 1452  Med List Status: <None>   Medication Order Taking? Sig Documenting Provider Last Dose Status Informant  amLODipine (NORVASC) 10 MG tablet [id number] Yes Take 1 tablet (10 mg total) by mouth daily. [name], MD Taking Active            Med Note ([place], [place] Peaks   Fri Sep 10, 2020  2:32 PM)    atorvastatin (LIPITOR) 80 MG tablet [id number] Yes Take 1 tablet (80 mg total) by mouth daily. [name], MD Taking Active   BESIVANCE 0.6 % SUSP [id number]  Apply to eye. [provider]  Active   carvedilol (COREG) 6.25 MG tablet [id number] Yes TAKE 1 TABLET (6.25 MG TOTAL) BY MOUTH 2 (TWO) TIMES DAILY WITH A MEAL. [name], MD Taking Active   ELIQUIS 2.5 MG TABS tablet [id number] Yes Take 1 tablet (2.5 mg total) by mouth  2 (two) times daily. [name], MD Taking Active   ezetimibe (ZETIA) 10 MG tablet [id number] Yes TAKE 1 TABLET BY MOUTH EVERY DAY [name], MD Taking Active   Ferrous Sulfate (IRON) 28 MG TABS [id number] Yes Take 28 mg by mouth daily at 12 noon. [provider] Taking Active   glucose blood (ACCU-CHEK GUIDE) test strip [id number] Yes USE TO CHECK BLOOD SUGARS TWICE DAILY. E11.9 [name], MD Taking Active   hydrALAZINE (APRESOLINE) 50 MG tablet [id number] Yes Take 50 mg by mouth 2 (two) times daily. [provider] Taking Active Multiple  Informants  Lancets Glory Rosebush ULTRASOFT) lancets [id number] Yes Use as instructed [name], MD Taking Active   linagliptin (TRADJENTA) 5 MG TABS tablet [id number]  Take 1 tablet (5 mg total) by mouth daily. [name], MD  Active   losartan (COZAAR) 50 MG tablet [id number] Yes Take 50 mg by mouth daily. [provider] Taking Active   memantine (NAMENDA) 5 MG tablet [id number] Yes Take 5 mg by mouth every evening. [provider] Taking Active            Med Note Nat Christen Dec 23, 2020  2:52 PM)    Vitamin D, Ergocalciferol, (DRISDOL) 50000 units CAPS capsule [id number] Yes Take 50,000 Units by mouth every 30 (thirty) days. [provider] Taking Active Multiple Informants           Med Note (S[place], Armstead Peaks   Fri Sep 10, 2020  2:38 PM) Patient states she only takes once a month, at the beginning of every month.             Patient Active Problem List   Diagnosis Date Noted   Malnutrition of moderate degree 09/13/2020   TIA (transient ischemic attack) 09/10/2020   Anemia of chronic disease 09/10/2020   CVA (cerebral vascular accident) ([place]) 09/10/2020   Sleeping difficulty 07/08/2020   Fatigue 06/09/2020   Mixed Alzheimer's and vascular dementia ([place]) 06/09/2020   Leukopenia 12/08/2019   Muscle strain 12/08/2019   Pedal edema 11/26/2019   Dysarthria and anarthria 11/07/2019   Atrial fibrillation ([place]) 10/21/2019   Nocturia 10/21/2019   Seborrheic keratosis 10/21/2019   Carotid stenosis 08/27/2019   Thyroid nodule 08/27/2019   Status post placement of implantable loop recorder 08/07/2019   Anemia 04/18/2019   Aphasia 04/12/2019   Benign hypertensive renal disease 04/07/2019   Hyperparathyroidism due to renal insufficiency ([place]) 04/07/2019   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, or unspecified 04/07/2019   Proteinuria 04/07/2019   History of stroke with current  residual effects 03/15/2019   Hyperlipidemia associated with type 2 diabetes mellitus ([place]) 07/25/2018   Itching 10/23/2017   Nevus 04/17/2017   CKD (chronic kidney disease), stage V ([place]) 02/02/2015   Vitamin D deficiency 02/02/2015   Obesity (BMI 30-39.9) 08/03/2014   DM type 2 (diabetes mellitus, type 2) ([place]) 01/29/2014   Essential hypertension 01/13/2014   HLD (hyperlipidemia) 01/13/2014    Conditions to be addressed/monitored: HTN, HLD, DMII, and ESRD  Care Plan : Medication Management  Updates made by De Hollingshead, RPH-CPP since 12/23/2020 12:00 AM     Problem: S/p CVA, vascular dementia, CKD, T2DM      Long-Range Goal: Medication Management   Start Date: 09/09/2020  This Visit's Progress: On track  Recent Progress: On track  Priority: High  Note:   Current Barriers:  Unable to  independently afford treatment regimen Unable to self administer medications as prescribed  Pharmacist Clinical Goal(s):  Over the next 90 days,patient will verbalize ability to afford treatment regimen Over the next 90 days, adhere to prescribed medication regimen through collaboration with PharmD and provider.   Interventions: 1:1 collaboration with [name], MD regarding development and update of comprehensive plan of care as evidenced by provider attestation and co-signature Inter-disciplinary care team collaboration (see longitudinal plan of care) Comprehensive medication review performed; medication list updated in electronic medical record  SDOH: Patient lives at home. Aphasia s/p multiple strokes. Family helps with care. Granddaughter, Rayna Sexton, helps with medication review today. Granddaughter is not on DPR. Sister, Brianna Dodson, is on [place]. Also has an aid, [name], that is in helping with care, medication management  Medication Monitoring: [facility], confirmed current medication regimen as listed below. Will fax updated list w/ my visit note after  we review home BP and BG on Monday.  Once regimen stable, will consider adherence packaging from [facility]. For now, pill box should be filled:  AM: Eliquis, carvedilol, amlodipine, hydralazine, ezetimibe, Tradjenta, iron, multivitamin PM: Eliquis, carvedilol, hydralazine, atorvastatin, losartan, memantine  Diabetes: Uncontrolled, but improved, more relaxed goal of <8% likely appropriate given comorbidities; current treatment: Tradjenta 5 mg daily Confirmed appropriate dosing given dialysis status Attempted to discuss blood sugars. Patient notes that her fasting a few day ago was 120, but her and Brianna Dodson are unable to interpret the aid's handwriting of other readings.  I will call back on Monday when the aid is there (after 9:30) to review readings.  Given patient's commercial drug plan, she can use Tradjenta savings card with insurance to reduce copay to $10/90 days. Script and card information send on prescription today, but have also asked Brianna Dodson to come pick up physical card tomorrow.  Hypertension and Atrial Fibrillation with ESRD: Unknown; current treatment: amlodipine 10 mg daily, carvedilol 6.25 mg BID, hydralazine 50 mg BID, losartan 50 mg daily Confirmed appropriate dosing given dialysis status MWF dialysis 11-4 pm [facility]  Anticoagulant regimen: Eliquis 2.5 mg BID - now affordable w/ $10/month copay card  Confirmed blood pressure regimen with dialysis center. Will discuss home BP readings with the aide next week.   Hyperlipidemia and secondary ASCVD prevention Controlled per last lipid panel; current treatment: ezetimibe 10 mg, atorvastatin 80 mg daily  Confirmed appropriate dosing given dialysis status Previous medications: rosuvastatin (d/c d/t renal function) Antiplatelet regimen: none given concurrent DOAC Recommended to continue current regimen at this time. Recommend lipid panel + liver function enzymes in 6-12 weeks.  Updated dialysis center on  regimen  Vascular Dementia and Cognitive Impairment: Uncontrolled; current regimen: memantine 5 mg QPM  Confirmed appropriate dosing given dialysis status Decision to avoid donepezil d/t risk of bradycardia Continue current regimen at this time along with collaboration with neurology    ESRD on HD: Managed at dialysis by nephrology; current regimen: Mircera 75 mcg Every 4 weeks. Iron Sucrose (Venofer) 100 mg Every Treatment Continue current regimen along with collaboration with nephrology at this time   Patient Goals/Self-Care Activities Over the next 90 days, patient will:  - take medications as prescribed check blood pressure daily, document, and provide at future appointments  Follow Up Plan: Telephone follow up appointment with care management team member scheduled for: 4 days      Medication Assistance:  None required.  Patient affirms current coverage meets needs. Commercial drug plan, so she can use manufacturer savings cards  in combination.  Follow Up:  Patient agrees to Care Plan and Follow-up.  Plan: Telephone follow up appointment with care management team member scheduled for:  ~ 4 days  Catie Darnelle Maffucci, PharmD, [place], [place] Clinical Pharmacist [employer] [facility] [employer] [phone]

## 2020-12-23 NOTE — Patient Instructions (Signed)
Brinley and [name],   This should be the accurate medication list. I confirmed this with the dialysis center.   [facility] with your insurance.   I will call on Monday morning to review blood sugars and blood pressures.   Take care!  Catie Darnelle Maffucci, PharmD [phone]  Visit Information  PATIENT GOALS:  Goals Addressed               This Visit's Progress     Patient Stated     Medication Management (pt-stated)        Patient Goals/Self-Care Activities Over the next 90 days, patient will:  - take medications as prescribed check blood pressure daily, document, and provide at future appointments         Patient verbalizes understanding of instructions provided today and agrees to view in [place].    Plan: Telephone follow up appointment with care management team member scheduled for:  ~ 4 days  Catie Darnelle Maffucci, PharmD, [place], [place] Clinical Pharmacist [employer] at [employer] [phone]

## 2020-12-24 ENCOUNTER — Ambulatory Visit: Payer: Medicare Other | Admitting: Pharmacist

## 2020-12-24 DIAGNOSIS — Z992 Dependence on renal dialysis: Secondary | ICD-10-CM

## 2020-12-24 DIAGNOSIS — E785 Hyperlipidemia, unspecified: Secondary | ICD-10-CM

## 2020-12-24 DIAGNOSIS — N186 End stage renal disease: Secondary | ICD-10-CM

## 2020-12-24 DIAGNOSIS — I6930 Unspecified sequelae of cerebral infarction: Secondary | ICD-10-CM

## 2020-12-24 DIAGNOSIS — I10 Essential (primary) hypertension: Secondary | ICD-10-CM

## 2020-12-24 DIAGNOSIS — Z23 Encounter for immunization: Secondary | ICD-10-CM | POA: Diagnosis not present

## 2020-12-24 DIAGNOSIS — D631 Anemia in chronic kidney disease: Secondary | ICD-10-CM | POA: Diagnosis not present

## 2020-12-24 DIAGNOSIS — N2581 Secondary hyperparathyroidism of renal origin: Secondary | ICD-10-CM | POA: Diagnosis not present

## 2020-12-24 DIAGNOSIS — D509 Iron deficiency anemia, unspecified: Secondary | ICD-10-CM | POA: Diagnosis not present

## 2020-12-24 NOTE — Chronic Care Management (AMB) (Signed)
Care Management   Pharmacy Note  12/24/2020 Name: Brianna Dodson MRN: [id number] DOB: December 25, 1933  Subjective: Brianna Dodson is a 87 y.o. year old female who is a primary care patient of Caryl Bis Angela Adam, MD. The Care Management team was consulted for assistance with care management and care coordination needs.    Care coordination  for  medication access  in response to provider referral for pharmacy case management and/or care coordination services.   The patient was given information about Care Management services today including:  Care Management services includes personalized support from designated clinical staff supervised by the patient's primary care provider, including individualized plan of care and coordination with other care providers. 24/7 contact phone numbers for assistance for urgent and routine care needs. The patient may stop case management services at any time by phone call to the office staff.  Patient agreed to services and consent obtained.  Assessment:  Review of patient status, including review of consultants reports, laboratory and other test data, was performed as part of comprehensive evaluation and provision of chronic care management services.   SDOH (Social Determinants of Health) assessments and interventions performed:  medication access  Objective:  Lab Results  Component Value Date   CREATININE 3.17 (H) 12/05/2020   CREATININE 2.60 (H) 12/02/2020   CREATININE 2.93 (H) 11/30/2020    Lab Results  Component Value Date   HGBA1C 6.8 (H) 09/11/2020       Component Value Date/Time   CHOL 87 09/11/2020 0223   TRIG 95 09/11/2020 0223   HDL 38 (L) 09/11/2020 0223   CHOLHDL 2.3 09/11/2020 0223   VLDL 19 09/11/2020 0223   LDLCALC 30 09/11/2020 0223   LDLDIRECT 98.0 01/24/2018 0912    Clinical ASCVD: Yes  The ASCVD Risk score Mikey Bussing DC Jr., et al., 2013) failed to calculate for the following reasons:   The 2013 ASCVD risk score is only valid for  ages 68 to 82   The patient has a prior MI or stroke diagnosis     BP Readings from Last 3 Encounters:  12/21/20 (!) 144/63  12/07/20 137/65  12/05/20 (!) 158/69    Care Plan  Allergies  Allergen Reactions   Nsaids     CKD stage III - Avoid all nephrotoxic drugs    Medications Reviewed Today     Reviewed by De Hollingshead, RPH-CPP (Pharmacist) on 12/23/20 at 1452  Med List Status: <None>   Medication Order Taking? Sig Documenting Provider Last Dose Status Informant  amLODipine (NORVASC) 10 MG tablet [id number] Yes Take 1 tablet (10 mg total) by mouth daily. [name], MD Taking Active            Med Note (SATTERFIELD, Armstead Peaks   Fri Sep 10, 2020  2:32 PM)    atorvastatin (LIPITOR) 80 MG tablet [id number] Yes Take 1 tablet (80 mg total) by mouth daily. [name], MD Taking Active   BESIVANCE 0.6 % SUSP [id number]  Apply to eye. [provider]  Active   carvedilol (COREG) 6.25 MG tablet [id number] Yes TAKE 1 TABLET (6.25 MG TOTAL) BY MOUTH 2 (TWO) TIMES DAILY WITH A MEAL. [name], MD Taking Active   ELIQUIS 2.5 MG TABS tablet [id number] Yes Take 1 tablet (2.5 mg total) by mouth 2 (two) times daily. [name], MD Taking Active   ezetimibe (ZETIA) 10 MG tablet [id number] Yes TAKE 1 TABLET BY MOUTH EVERY DAY [name], MD Taking Active   Ferrous  Sulfate (IRON) 28 MG TABS [id number] Yes Take 28 mg by mouth daily at 12 noon. [provider] Taking Active   glucose blood (ACCU-CHEK GUIDE) test strip [id number] Yes USE TO CHECK BLOOD SUGARS TWICE DAILY. E11.9 [name], MD Taking Active   hydrALAZINE (APRESOLINE) 50 MG tablet [id number] Yes Take 50 mg by mouth 2 (two) times daily. [provider] Taking Active Multiple Informants  Lancets Glory Rosebush ULTRASOFT) lancets [id number] Yes Use as instructed [name], MD Taking Active   linagliptin (TRADJENTA) 5 MG TABS tablet [id number]  Take 1 tablet (5 mg  total) by mouth daily. [name], MD  Active   losartan (COZAAR) 50 MG tablet [id number] Yes Take 50 mg by mouth daily. [provider] Taking Active   memantine (NAMENDA) 5 MG tablet [id number] Yes Take 5 mg by mouth every evening. [provider] Taking Active            Med Note Nat Christen Dec 23, 2020  2:52 PM)    Vitamin D, Ergocalciferol, (DRISDOL) 50000 units CAPS capsule [id number] Yes Take 50,000 Units by mouth every 30 (thirty) days. [provider] Taking Active Multiple Informants           Med Note (SATTERFIELD, Armstead Peaks   Fri Sep 10, 2020  2:38 PM) Patient states she only takes once a month, at the beginning of every month.             Patient Active Problem List   Diagnosis Date Noted   Malnutrition of moderate degree 09/13/2020   TIA (transient ischemic attack) 09/10/2020   Anemia of chronic disease 09/10/2020   CVA (cerebral vascular accident) ([place]) 09/10/2020   Sleeping difficulty 07/08/2020   Fatigue 06/09/2020   Mixed Alzheimer's and vascular dementia ([place]) 06/09/2020   Leukopenia 12/08/2019   Muscle strain 12/08/2019   Pedal edema 11/26/2019   Dysarthria and anarthria 11/07/2019   Atrial fibrillation ([place]) 10/21/2019   Nocturia 10/21/2019   Seborrheic keratosis 10/21/2019   Carotid stenosis 08/27/2019   Thyroid nodule 08/27/2019   Status post placement of implantable loop recorder 08/07/2019   Anemia 04/18/2019   Aphasia 04/12/2019   Benign hypertensive renal disease 04/07/2019   Hyperparathyroidism due to renal insufficiency ([place]) 04/07/2019   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, or unspecified 04/07/2019   Proteinuria 04/07/2019   History of stroke with current residual effects 03/15/2019   Hyperlipidemia associated with type 2 diabetes mellitus ([place]) 07/25/2018   Itching 10/23/2017   Nevus 04/17/2017   CKD (chronic kidney disease), stage V ([place])  02/02/2015   Vitamin D deficiency 02/02/2015   Obesity (BMI 30-39.9) 08/03/2014   DM type 2 (diabetes mellitus, type 2) ([place]) 01/29/2014   Essential hypertension 01/13/2014   HLD (hyperlipidemia) 01/13/2014    Conditions to be addressed/monitored: CAD, HTN, HLD, DMII, and ESRD  Care Plan : Medication Management  Updates made by De Hollingshead, RPH-CPP since 12/24/2020 12:00 AM     Problem: S/p CVA, vascular dementia, CKD, T2DM      Long-Range Goal: Medication Management   Start Date: 09/09/2020  This Visit's Progress: On track  Recent Progress: On track  Priority: High  Note:   Current Barriers:  Unable to independently afford treatment regimen Unable to self administer medications as prescribed  Pharmacist Clinical Goal(s):  Over the next 90 days,patient will verbalize ability to afford treatment regimen Over the next 90 days, adhere  to prescribed medication regimen through collaboration with PharmD and provider.   Interventions: 1:1 collaboration with [name], MD regarding development and update of comprehensive plan of care as evidenced by provider attestation and co-signature Inter-disciplinary care team collaboration (see longitudinal plan of care) Comprehensive medication review performed; medication list updated in electronic medical record  SDOH: Patient lives at home. Aphasia s/p multiple strokes. Family helps with care. Granddaughter, Rayna Sexton, helps with medication review today. Granddaughter is not on DPR. Sister, [name], is on [place]. Also has an aid, [name], that is in helping with care, medication management  Medication Monitoring: [facility], confirmed current medication regimen as listed below. Will fax updated list w/ my visit note after we review home BP and BG on Monday.  Once regimen stable, will consider adherence packaging from [facility]. For now, pill box should be filled:  AM: Eliquis, carvedilol,  amlodipine, hydralazine, ezetimibe, Tradjenta, iron, multivitamin PM: Eliquis, carvedilol, hydralazine, atorvastatin, losartan, memantine  Diabetes: Uncontrolled, but improved, more relaxed goal of <8% likely appropriate given comorbidities; current treatment: Tradjenta 5 mg daily Previously confirmed appropriate dosing given dialysis status Received message from pharmacy that Januvia was a preferred alternative. Contacted pharmacy. Savings card is not running through without her insurance covering the product. Completed a PA stating that Lady Gary is the only appropriate DPP4 given her degree of renal impairment. Will follow for result  Hypertension and Atrial Fibrillation with ESRD: Unknown; current treatment: amlodipine 10 mg daily, carvedilol 6.25 mg BID, hydralazine 50 mg BID, losartan 50 mg daily Previously confirmed appropriate dosing given dialysis status MWF dialysis 11-4 pm [facility]  Anticoagulant regimen: Eliquis 2.5 mg BID - now affordable w/ $10/month copay card  Will discuss home BP readings with the aide next week.   Hyperlipidemia and secondary ASCVD prevention Controlled per last lipid panel; current treatment: ezetimibe 10 mg, atorvastatin 80 mg daily  Previously onfirmed appropriate dosing given dialysis status Previous medications: rosuvastatin (d/c d/t renal function) Antiplatelet regimen: none given concurrent DOAC Previously recommended to continue current regimen at this time. Recommend lipid panel + liver function enzymes in 6-12 weeks.  Updated dialysis center on regimen  Vascular Dementia and Cognitive Impairment: Uncontrolled; current regimen: memantine 5 mg QPM  Previously confirmed appropriate dosing given dialysis status Decision to avoid donepezil d/t risk of bradycardia Previously recommended to continue current regimen at this time along with collaboration with neurology    ESRD on HD: Managed at dialysis by nephrology; current regimen:  Mircera 75 mcg Every 4 weeks. Iron Sucrose (Venofer) 100 mg Every Treatment Previously recommended to continue current regimen along with collaboration with nephrology at this time   Patient Goals/Self-Care Activities Over the next 90 days, patient will:  - take medications as prescribed check blood pressure daily, document, and provide at future appointments  Follow Up Plan: Telephone follow up appointment with care management team member scheduled for: 3 days      Medication Assistance:  None required.  Patient affirms current coverage meets needs.  Follow Up:  Patient agrees to Care Plan and Follow-up.  Plan: Telephone follow up appointment with care management team member scheduled for:  3 days  Catie Darnelle Maffucci, PharmD, [place], [place] Clinical Pharmacist [employer] [facility] [employer] [phone]

## 2020-12-24 NOTE — Telephone Encounter (Signed)
I am going to forward to Catie to review as it appears there was a savings card that should have reduce the patient's cost.

## 2020-12-24 NOTE — Patient Instructions (Signed)
Visit Information   Goals Addressed               This Visit's Progress     Patient Stated     Medication Management (pt-stated)        Patient Goals/Self-Care Activities Over the next 90 days, patient will:  - take medications as prescribed check blood pressure daily, document, and provide at future appointments          Patient verbalizes understanding of instructions provided today and agrees to view in [place].   Plan: Telephone follow up appointment with care management team member scheduled for:  3 days  Catie Darnelle Maffucci, PharmD, [place], [place] Clinical Pharmacist [employer] [facility] [employer] [phone]

## 2020-12-27 ENCOUNTER — Ambulatory Visit: Payer: Medicare Other | Admitting: Pharmacist

## 2020-12-27 DIAGNOSIS — N186 End stage renal disease: Secondary | ICD-10-CM | POA: Diagnosis not present

## 2020-12-27 DIAGNOSIS — Z23 Encounter for immunization: Secondary | ICD-10-CM | POA: Diagnosis not present

## 2020-12-27 DIAGNOSIS — I10 Essential (primary) hypertension: Secondary | ICD-10-CM

## 2020-12-27 DIAGNOSIS — N2581 Secondary hyperparathyroidism of renal origin: Secondary | ICD-10-CM | POA: Diagnosis not present

## 2020-12-27 DIAGNOSIS — E785 Hyperlipidemia, unspecified: Secondary | ICD-10-CM

## 2020-12-27 DIAGNOSIS — Z992 Dependence on renal dialysis: Secondary | ICD-10-CM | POA: Diagnosis not present

## 2020-12-27 DIAGNOSIS — D509 Iron deficiency anemia, unspecified: Secondary | ICD-10-CM | POA: Diagnosis not present

## 2020-12-27 DIAGNOSIS — D631 Anemia in chronic kidney disease: Secondary | ICD-10-CM | POA: Diagnosis not present

## 2020-12-27 MED ORDER — ONETOUCH ULTRASOFT LANCETS MISC: 3 refills | Status: DC

## 2020-12-27 MED ORDER — ACCU-CHEK GUIDE VI STRP: ORAL_STRIP | 3 refills | Status: DC

## 2020-12-27 NOTE — Patient Instructions (Signed)
Visit Information   Goals Addressed               This Visit's Progress     Patient Stated     Medication Management (pt-stated)        Patient Goals/Self-Care Activities Over the next 90 days, patient will:  - take medications as prescribed check blood pressure daily, document, and provide at future appointments           Patient verbalizes understanding of instructions provided today and agrees to view in [place].   Plan: Telephone follow up appointment with care management team member scheduled for:  ~ 6 weeks  Catie Darnelle Maffucci, PharmD, [place], [place] Clinical Pharmacist [employer] [facility] [employer] [phone]

## 2020-12-27 NOTE — Telephone Encounter (Signed)
PA approved for Tradjenta. Communicated this to pharmacy.

## 2020-12-27 NOTE — Chronic Care Management (AMB) (Signed)
Chronic Care Management Pharmacy Note  12/27/2020 Name:  Brianna Dodson MRN:  [id number] DOB:  [birth year]   Subjective: Brianna Dodson is an 87 y.o. year old female who is a primary patient of Sonnenberg, Angela Adam, MD.  The CCM team was consulted for assistance with disease management and care coordination needs.    Engaged with patient by telephone for follow up visit in response to provider referral for pharmacy case management and/or care coordination services. Also spoke with aid, [name]  Consent to Services:  The patient was given information about Chronic Care Management services, agreed to services, and gave verbal consent prior to initiation of services.  Please see initial visit note for detailed documentation.   Patient Care Team: [name], MD as PCP - General (Family Medicine) De Hollingshead, RPH-CPP (Pharmacist)  Objective:  Lab Results  Component Value Date   CREATININE 3.17 (H) 12/05/2020   CREATININE 2.60 (H) 12/02/2020   CREATININE 2.93 (H) 11/30/2020    Lab Results  Component Value Date   HGBA1C 6.8 (H) 09/11/2020   Last diabetic Eye exam:  Lab Results  Component Value Date/Time   HMDIABEYEEXA No Retinopathy 09/15/2014 12:00 AM    Last diabetic Foot exam: No results found for: HMDIABFOOTEX      Component Value Date/Time   CHOL 87 09/11/2020 0223   TRIG 95 09/11/2020 0223   HDL 38 (L) 09/11/2020 0223   CHOLHDL 2.3 09/11/2020 0223   VLDL 19 09/11/2020 0223   LDLCALC 30 09/11/2020 0223   LDLDIRECT 98.0 01/24/2018 0912    Hepatic Function Latest Ref Rng & Units 06/09/2020 11/26/2019 08/20/2019  Total Protein 6.0 - 8.3 g/dL 7.2 6.8 7.1  Albumin 3.5 - 5.2 g/dL 3.9 4.1 3.5  AST 0 - 37 U/L _0 ALT 0 - 35 U/L _1 Alk Phosphatase 39 - 117 U/L 48 46 46  Total Bilirubin 0.2 - 1.2 mg/dL 0.6 0.4 0.9  Bilirubin, Direct 0.0 - 0.3 mg/dL - - -    Lab Results  Component Value Date/Time   TSH 2.55 06/09/2020 11:45 AM   TSH 2.32  04/21/2020 10:04 AM    CBC Latest Ref Rng & Units 12/05/2020 12/02/2020 11/30/2020  WBC 4.0 - 10.5 K/uL 3.5(L) - 3.1(L)  Hemoglobin 12.0 - 15.0 g/dL 9.9(L) 11.2(L) 10.1(L)  Hematocrit 36.0 - 46.0 % 28.6(L) 33.0(L) 30.4(L)  Platelets 150 - 400 K/uL 145(L) - 135(L)    Lab Results  Component Value Date/Time   VD25OH 28.70 (L) 06/09/2020 11:45 AM   VD25OH 41.14 01/06/2016 09:23 AM    Clinical ASCVD: Yes  The ASCVD Risk score Mikey Bussing DC Jr., et al., 2013) failed to calculate for the following reasons:   The 2013 ASCVD risk score is only valid for ages 86 to 81   The patient has a prior MI or stroke diagnosis     Social History   Tobacco Use  Smoking Status Never  Smokeless Tobacco Never   BP Readings from Last 3 Encounters:  12/21/20 (!) 144/63  12/07/20 137/65  12/05/20 (!) 158/69   Pulse Readings from Last 3 Encounters:  12/21/20 68  12/07/20 72  12/05/20 66   Wt Readings from Last 3 Encounters:  12/21/20 149 lb 9.6 oz (67.9 kg)  12/07/20 152 lb 12.8 oz (69.3 kg)  12/05/20 152 lb 1.9 oz (69 kg)    Assessment: Review of patient past medical history, allergies, medications, health status, including review of consultants reports, laboratory  and other test data, was performed as part of comprehensive evaluation and provision of chronic care management services.   SDOH:  (Social Determinants of Health) assessments and interventions performed: none   CCM Care Plan  Allergies  Allergen Reactions   Nsaids     CKD stage III - Avoid all nephrotoxic drugs    Medications Reviewed Today     Reviewed by De Hollingshead, RPH-CPP (Pharmacist) on 12/23/20 at 1452  Med List Status: <None>   Medication Order Taking? Sig Documenting Provider Last Dose Status Informant  amLODipine (NORVASC) 10 MG tablet [id number] Yes Take 1 tablet (10 mg total) by mouth daily. [name], MD Taking Active            Med Note (SATTERFIELD, Armstead Peaks   Fri Sep 10, 2020  2:32 PM)     atorvastatin (LIPITOR) 80 MG tablet [id number] Yes Take 1 tablet (80 mg total) by mouth daily. [name], MD Taking Active   BESIVANCE 0.6 % SUSP [id number]  Apply to eye. [provider]  Active   carvedilol (COREG) 6.25 MG tablet [id number] Yes TAKE 1 TABLET (6.25 MG TOTAL) BY MOUTH 2 (TWO) TIMES DAILY WITH A MEAL. [name], MD Taking Active   ELIQUIS 2.5 MG TABS tablet [id number] Yes Take 1 tablet (2.5 mg total) by mouth 2 (two) times daily. [name], MD Taking Active   ezetimibe (ZETIA) 10 MG tablet [id number] Yes TAKE 1 TABLET BY MOUTH EVERY DAY [name], MD Taking Active   Ferrous Sulfate (IRON) 28 MG TABS [id number] Yes Take 28 mg by mouth daily at 12 noon. [provider] Taking Active   glucose blood (ACCU-CHEK GUIDE) test strip [id number] Yes USE TO CHECK BLOOD SUGARS TWICE DAILY. E11.9 [name], MD Taking Active   hydrALAZINE (APRESOLINE) 50 MG tablet [id number] Yes Take 50 mg by mouth 2 (two) times daily. [provider] Taking Active Multiple Informants  Lancets Glory Rosebush ULTRASOFT) lancets [id number] Yes Use as instructed [name], MD Taking Active   linagliptin (TRADJENTA) 5 MG TABS tablet [id number]  Take 1 tablet (5 mg total) by mouth daily. [name], MD  Active   losartan (COZAAR) 50 MG tablet [id number] Yes Take 50 mg by mouth daily. [provider] Taking Active   memantine (NAMENDA) 5 MG tablet [id number] Yes Take 5 mg by mouth every evening. [provider] Taking Active            Med Note Nat Christen Dec 23, 2020  2:52 PM)    Vitamin D, Ergocalciferol, (DRISDOL) 50000 units CAPS capsule [id number] Yes Take 50,000 Units by mouth every 30 (thirty) days. [provider] Taking Active Multiple Informants           Med Note (SATTERFIELD, Armstead Peaks   Fri Sep 10, 2020  2:38 PM) Patient states she only takes once a month, at the beginning of every month.              Patient Active Problem List   Diagnosis Date Noted   Malnutrition of moderate degree 09/13/2020   TIA (transient ischemic attack) 09/10/2020   Anemia of chronic disease 09/10/2020   CVA (cerebral vascular accident) ([place]) 09/10/2020   Sleeping difficulty 07/08/2020   Fatigue 06/09/2020   Mixed Alzheimer's and vascular dementia ([place]) 06/09/2020   Leukopenia 12/08/2019   Muscle strain 12/08/2019   Pedal edema 11/26/2019   Dysarthria and anarthria 11/07/2019  Atrial fibrillation ([place]) 10/21/2019   Nocturia 10/21/2019   Seborrheic keratosis 10/21/2019   Carotid stenosis 08/27/2019   Thyroid nodule 08/27/2019   Status post placement of implantable loop recorder 08/07/2019   Anemia 04/18/2019   Aphasia 04/12/2019   Benign hypertensive renal disease 04/07/2019   Hyperparathyroidism due to renal insufficiency ([place]) 04/07/2019   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, or unspecified 04/07/2019   Proteinuria 04/07/2019   History of stroke with current residual effects 03/15/2019   Hyperlipidemia associated with type 2 diabetes mellitus ([place]) 07/25/2018   Itching 10/23/2017   Nevus 04/17/2017   CKD (chronic kidney disease), stage V ([place]) 02/02/2015   Vitamin D deficiency 02/02/2015   Obesity (BMI 30-39.9) 08/03/2014   DM type 2 (diabetes mellitus, type 2) ([place]) 01/29/2014   Essential hypertension 01/13/2014   HLD (hyperlipidemia) 01/13/2014    Immunization History  Administered Date(s) Administered   PFIZER Comirnaty(Gray Top)Covid-19 Tri-Sucrose Vaccine 10/08/2020   PFIZER(Purple Top)SARS-COV-2 Vaccination 07/25/2019, 08/15/2019    Conditions to be addressed/monitored: Atrial Fibrillation, HTN, HLD, DMII, and ESRD  Care Plan : Medication Management  Updates made by De Hollingshead, RPH-CPP since 12/27/2020 12:00 AM     Problem: S/p CVA, vascular dementia, CKD, T2DM      Long-Range Goal: Medication  Management   Start Date: 09/09/2020  This Visit's Progress: On track  Recent Progress: On track  Priority: High  Note:   Current Barriers:  Unable to independently afford treatment regimen Unable to self administer medications as prescribed  Pharmacist Clinical Goal(s):  Over the next 90 days,patient will verbalize ability to afford treatment regimen Over the next 90 days, adhere to prescribed medication regimen through collaboration with PharmD and provider.   Interventions: 1:1 collaboration with [name], MD regarding development and update of comprehensive plan of care as evidenced by provider attestation and co-signature Inter-disciplinary care team collaboration (see longitudinal plan of care) Comprehensive medication review performed; medication list updated in electronic medical record  SDOH: Patient lives at home. Aphasia s/p multiple strokes. Family helps with care. Granddaughter, Rayna Sexton, helps with medication review today. Granddaughter is not on DPR. Sister, [name], is on [place]. Also has an aid, [name], that is in helping with care, medication management  Medication Monitoring: [facility], confirmed current medication regimen as listed below. Faxing this + updated medication list to dialysis center to have on file Once regimen stable, will consider adherence [facility]. For now, pill box should be filled:  AM: Eliquis, carvedilol, amlodipine, hydralazine, ezetimibe, Tradjenta, iron, multivitamin PM: Eliquis, carvedilol, hydralazine, atorvastatin, losartan, memantine  Diabetes: Uncontrolled, but improved, more relaxed goal of <8% likely appropriate given comorbidities; current treatment: Tradjenta 5 mg daily Current glucose readings: aide had some issues with supplies over the past few weeks not reading, even with finger being clean, requested that refills on testing supplies be sent today. Suspects that they are leaving  testing strips out and open.  6/13: no reading 6/14: 167 6/16: 134 6/20: no reading 6/21: no reading 6/23: no reading 6/27: 234 -  reports that over the weekend, they had burgers chinese food [name] was discussing reducing carbohydrate intake when I called. Reiterated this education. Reiterated that the only other option for medication therapy would be insulin at this point.  Continue current regimen at this time.  PA for Tradjenta approved through 12/25/21. [facility] are [facility] this today. [name] is aware and will help the patient  go pick up.  Hypertension and Atrial Fibrillation with ESRD: Moderately well controlleed; current treatment: amlodipine 10 mg daily, carvedilol 6.25 mg BID, hydralazine 50 mg BID, losartan 50 mg daily 6/13: 178/73 6/14: 120/56 6/16: 132/62 6/20: 130/60 6/21: 134/60 6/23: 118/56 Previously confirmed appropriate dosing given dialysis status MWF dialysis 11-4 pm [facility]  Anticoagulant regimen: Eliquis 2.5 mg BID - now affordable w/ $10/month copay card  Aside from one elevated reading, readings are generally well controlled. Continue current regimen at this time. Faxing this note to dialysis center.   Hyperlipidemia and secondary ASCVD prevention Controlled per last lipid panel; current treatment: ezetimibe 10 mg, atorvastatin 80 mg daily  Previously confirmed appropriate dosing given dialysis status Previous medications: rosuvastatin (d/c d/t renal function) Antiplatelet regimen: none given concurrent DOAC Previously recommended to continue current regimen at this time. Recommend lipid panel + liver function enzymes in 6-12 weeks.   Vascular Dementia and Cognitive Impairment: Uncontrolled; current regimen: memantine 5 mg QPM  Previously confirmed appropriate dosing given dialysis status Decision to avoid donepezil d/t risk of bradycardia Previously recommended to continue current regimen at this time along with collaboration with  neurology     Patient Goals/Self-Care Activities Over the next 90 days, patient will:  - take medications as prescribed check blood pressure daily, document, and provide at future appointments  Follow Up Plan: Telephone follow up appointment with care management team member scheduled for: 6 weeks     Medication Assistance: None required.  Patient affirms current coverage meets needs.  [id number] [place] [address]Phone: [phone]: [phone]  Follow Up:  Patient agrees to Care Plan and Follow-up.  Plan: Telephone follow up appointment with care management team member scheduled for:  ~ 6 weeks  Catie TDarnelle Maffucci PharmD, B[place] CLutherClinical Pharmacist LOccidental Petroleumat BJohnson & [phone]

## 2020-12-27 NOTE — Addendum Note (Signed)
Addended by: De Hollingshead on: 12/27/2020 01:15 PM   Modules accepted: Orders

## 2020-12-29 DIAGNOSIS — N2581 Secondary hyperparathyroidism of renal origin: Secondary | ICD-10-CM | POA: Diagnosis not present

## 2020-12-29 DIAGNOSIS — D631 Anemia in chronic kidney disease: Secondary | ICD-10-CM | POA: Diagnosis not present

## 2020-12-29 DIAGNOSIS — N186 End stage renal disease: Secondary | ICD-10-CM | POA: Diagnosis not present

## 2020-12-29 DIAGNOSIS — Z992 Dependence on renal dialysis: Secondary | ICD-10-CM | POA: Diagnosis not present

## 2020-12-29 DIAGNOSIS — D509 Iron deficiency anemia, unspecified: Secondary | ICD-10-CM | POA: Diagnosis not present

## 2020-12-29 DIAGNOSIS — Z23 Encounter for immunization: Secondary | ICD-10-CM | POA: Diagnosis not present

## 2020-12-30 ENCOUNTER — Telehealth: Payer: Self-pay

## 2020-12-30 DIAGNOSIS — D631 Anemia in chronic kidney disease: Secondary | ICD-10-CM | POA: Diagnosis not present

## 2020-12-30 DIAGNOSIS — E1122 Type 2 diabetes mellitus with diabetic chronic kidney disease: Secondary | ICD-10-CM | POA: Diagnosis not present

## 2020-12-30 DIAGNOSIS — L03114 Cellulitis of left upper limb: Secondary | ICD-10-CM | POA: Diagnosis not present

## 2020-12-30 DIAGNOSIS — Z48812 Encounter for surgical aftercare following surgery on the circulatory system: Secondary | ICD-10-CM | POA: Diagnosis not present

## 2020-12-30 DIAGNOSIS — I120 Hypertensive chronic kidney disease with stage 5 chronic kidney disease or end stage renal disease: Secondary | ICD-10-CM | POA: Diagnosis not present

## 2020-12-30 DIAGNOSIS — Z992 Dependence on renal dialysis: Secondary | ICD-10-CM | POA: Diagnosis not present

## 2020-12-30 DIAGNOSIS — N186 End stage renal disease: Secondary | ICD-10-CM | POA: Diagnosis not present

## 2020-12-30 MED ORDER — AMLODIPINE BESYLATE 10 MG PO TABS: 10.0000 mg | ORAL_TABLET | Freq: Every day | ORAL | 0 refills | Status: DC

## 2020-12-30 NOTE — Telephone Encounter (Signed)
Pt needs a refill of amLODipine (NORVASC) 10 MG tablet [facility] [place] on [address]

## 2020-12-31 DIAGNOSIS — D509 Iron deficiency anemia, unspecified: Secondary | ICD-10-CM | POA: Diagnosis not present

## 2020-12-31 DIAGNOSIS — N186 End stage renal disease: Secondary | ICD-10-CM | POA: Diagnosis not present

## 2020-12-31 DIAGNOSIS — N2581 Secondary hyperparathyroidism of renal origin: Secondary | ICD-10-CM | POA: Diagnosis not present

## 2020-12-31 DIAGNOSIS — Z992 Dependence on renal dialysis: Secondary | ICD-10-CM | POA: Diagnosis not present

## 2020-12-31 DIAGNOSIS — D631 Anemia in chronic kidney disease: Secondary | ICD-10-CM | POA: Diagnosis not present

## 2021-01-03 ENCOUNTER — Encounter (INDEPENDENT_AMBULATORY_CARE_PROVIDER_SITE_OTHER): Payer: Self-pay | Admitting: Nurse Practitioner

## 2021-01-03 DIAGNOSIS — D631 Anemia in chronic kidney disease: Secondary | ICD-10-CM | POA: Diagnosis not present

## 2021-01-03 DIAGNOSIS — Z992 Dependence on renal dialysis: Secondary | ICD-10-CM | POA: Diagnosis not present

## 2021-01-03 DIAGNOSIS — N2581 Secondary hyperparathyroidism of renal origin: Secondary | ICD-10-CM | POA: Diagnosis not present

## 2021-01-03 DIAGNOSIS — N186 End stage renal disease: Secondary | ICD-10-CM | POA: Diagnosis not present

## 2021-01-03 DIAGNOSIS — D509 Iron deficiency anemia, unspecified: Secondary | ICD-10-CM | POA: Diagnosis not present

## 2021-01-03 NOTE — Progress Notes (Signed)
Subjective:    [name], female    DOB: 27-Dec-1933, 87 y.o.   MRN: [id number] Chief Complaint  Patient presents with   Wound Check    2week follow up    Brianna Dodson is an 87 year old female that presents today for follow-up evaluation following left brachiocephalic AV fistula placement.  The patient has severe swelling and some redness around her incision.  The redness has resolved as has the majority of the swelling.  The patient still has some swelling present around the antecubital fossa area.   Review of Systems  All other systems reviewed and are negative.     Objective:   Physical Exam Vitals reviewed.  HENT:     Head: Normocephalic.  Cardiovascular:     Rate and Rhythm: Normal rate.     Pulses: Normal pulses.     Arteriovenous access: Left arteriovenous access is present.    Comments: Thrill and bruit not easily felt consistent with previous exam and previous HDA Pulmonary:     Effort: Pulmonary effort is normal.  Neurological:     Mental Status: She is alert and oriented to person, place, and time.     Motor: Weakness present.  Psychiatric:        Mood and Affect: Mood normal.        Behavior: Behavior normal.        Thought Content: Thought content normal.        Judgment: Judgment normal.    BP (!) 144/63 (BP Location: Right Arm)   Pulse 68   Resp 16   Wt 149 lb 9.6 oz (67.9 kg)   BMI 24.89 kg/m   Past Medical History:  Diagnosis Date   A-fib ([place])    Alzheimer disease ([place])    Anemia    Carotid stenosis    Cerebral infarction ([place]) 04/12/2019   Multiple new foci of acute infarction affect the RIGHT hemisphere affecting the frontal, posterior frontal, posterior temporal, anterior parietal cortex and regional white matter.   Cerebral infarction due to embolism of right middle cerebral artery ([place]) 08/20/2019   RIGHT MCA distal M1   Chronic anticoagulation    Apixaban   CKD (chronic kidney disease), stage 4(HCC)    Embolic stroke  involving cerebral artery ([place]) 03/15/2019   RIGHT MCA/PCA territory   Encephalomalacia    RIGHT posterior MCA territory   HCV (hepatitis C virus)    History of blood transfusion    Hyperlipidemia    Hypertension    OSA (obstructive sleep apnea)    Status post placement of implantable loop recorder 07/30/2019   T2DM (type 2 diabetes mellitus) ([place])     Social History   Socioeconomic History   Marital status: Married    Spouse name: Not on file   Number of children: 2   Years of education: Not on file   Highest education level: Not on file  Occupational History   Occupation: [employer]    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 Retired  Tobacco Use   Smoking status: Never   Smokeless tobacco: Never  Vaping Use   Vaping Use: Never used  Substance and Sexual Activity   Alcohol use: No   Drug use: No   Sexual activity: Not Currently  Other Topics Concern   Not on file  Social History Narrative   She is married and has two adult children (adopted).    Hobbies: Decorating   Exercise: None   Caffeine: 4 cups a week  Social Determinants of Health   Financial Resource Strain: Low Risk    Difficulty of Paying Living Expenses: Not hard at all  Food Insecurity: Not on file  Transportation Needs: Not on file  Physical Activity: Not on file  Stress: Not on file  Social Connections: Not on file  Intimate Partner Violence: Not on file    Past Surgical History:  Procedure Laterality Date   [place] Left 12/02/2020   Procedure: INSERTION OF ARTERIOVENOUS (AV)  FISTULA  ARM ( BRACHIO- CEPHALIC);  Surgeon: Algernon Huxley, MD;  Location: [facility];  Service: Vascular;  Laterality: Left;   DIALYSIS/PERMA CATHETER INSERTION N/A 10/06/2020   Procedure: DIALYSIS/PERMA CATHETER INSERTION;  Surgeon: Algernon Huxley, MD;  Location: [facility];  Service: Cardiovascular;  Laterality: N/A;   LOOP RECORDER INSERTION N/A 07/30/2019   Procedure: LOOP  RECORDER INSERTION;  Surgeon: Isaias Cowman, MD;  Location: [facility];  Service: Cardiovascular;  Laterality: N/A;   TEE WITHOUT CARDIOVERSION N/A 04/14/2019   Procedure: TRANSESOPHAGEAL ECHOCARDIOGRAM (TEE);  Surgeon: Teodoro Spray, MD;  Location: [facility];  Service: Cardiovascular;  Laterality: N/A;    Family History  Problem Relation Age of Onset   Stroke Mother    Diabetes Mother    Heart disease Father    Kidney disease Sister    Diabetes Sister    Kidney disease Brother    Heart disease Sister    Diabetes Sister    Diabetes Sister    Diabetes Sister    Kidney disease Sister    Heart disease Sister    Kidney disease Brother        kidney transplant   Early death Brother 69       Truck Accident - died   Heart disease Brother     Allergies  Allergen Reactions   Nsaids     CKD stage III - Avoid all nephrotoxic drugs    CBC Latest Ref Rng & Units 12/05/2020 12/02/2020 11/30/2020  WBC 4.0 - 10.5 K/uL 3.5(L) - 3.1(L)  Hemoglobin 12.0 - 15.0 g/dL 9.9(L) 11.2(L) 10.1(L)  Hematocrit 36.0 - 46.0 % 28.6(L) 33.0(L) 30.4(L)  Platelets 150 - 400 K/uL 145(L) - 135(L)      CMP     Component Value Date/Time   NA 138 12/05/2020 0948   NA 142 11/10/2013 0522   K 3.1 (L) 12/05/2020 0948   K 4.6 11/10/2013 0522   CL 99 12/05/2020 0948   CL 110 (H) 11/10/2013 0522   CO2 28 12/05/2020 0948   CO2 27 11/10/2013 0522   GLUCOSE 168 (H) 12/05/2020 0948   GLUCOSE 70 11/10/2013 0522   BUN 14 12/05/2020 0948   BUN 22 (H) 11/10/2013 0522   CREATININE 3.17 (H) 12/05/2020 0948   CREATININE 1.12 11/10/2013 0522   CALCIUM 8.6 (L) 12/05/2020 0948   CALCIUM 8.4 (L) 11/10/2013 0522   PROT 7.2 06/09/2020 1145   PROT 9.0 (H) 11/09/2013 1540   ALBUMIN 3.9 06/09/2020 1145   ALBUMIN 3.8 11/09/2013 1540   AST 21 06/09/2020 1145   AST 29 11/09/2013 1540   ALT 20 06/09/2020 1145   ALT 27 11/09/2013 1540   ALKPHOS 48 06/09/2020 1145   ALKPHOS 59 11/09/2013 1540   BILITOT 0.6  06/09/2020 1145   BILITOT 0.2 11/09/2013 1540   GFRNONAA 14 (L) 12/05/2020 0948   GFRNONAA 47 (L) 11/10/2013 0522   GFRAA 27 (L) 08/22/2019 0449   GFRAA  54 (L) 11/10/2013 0522     No results found.     Assessment & Plan:   1. ESRD (end stage renal disease) ([place]) The wound is much improved compared to what it was previously.  We will have the patient return as previously scheduled to repeat noninvasive studies following extensive edema of the upper extremity.  2. Essential hypertension Continue antihypertensive medications as already ordered, these medications have been reviewed and there are no changes at this time.    Current Outpatient Medications on File Prior to Visit  Medication Sig Dispense Refill   atorvastatin (LIPITOR) 80 MG tablet Take 1 tablet (80 mg total) by mouth daily. 90 tablet 3   BESIVANCE 0.6 % SUSP Apply to eye.     carvedilol (COREG) 6.25 MG tablet TAKE 1 TABLET (6.25 MG TOTAL) BY MOUTH 2 (TWO) TIMES DAILY WITH A MEAL. 180 tablet 1   ELIQUIS 2.5 MG TABS tablet Take 1 tablet (2.5 mg total) by mouth 2 (two) times daily. 56 tablet 0   ezetimibe (ZETIA) 10 MG tablet TAKE 1 TABLET BY MOUTH EVERY DAY 90 tablet 3   Ferrous Sulfate (IRON) 28 MG TABS Take 28 mg by mouth daily at 12 noon.     hydrALAZINE (APRESOLINE) 50 MG tablet Take 50 mg by mouth 2 (two) times daily.     memantine (NAMENDA) 5 MG tablet Take 5 mg by mouth every evening.     Vitamin D, Ergocalciferol, (DRISDOL) 50000 units CAPS capsule Take 50,000 Units by mouth every 30 (thirty) days.  1   No current facility-administered medications on file prior to visit.    There are no Patient Instructions on file for this visit. No follow-ups on file.   Kris Hartmann, NP

## 2021-01-04 ENCOUNTER — Telehealth: Payer: Self-pay | Admitting: Family Medicine

## 2021-01-04 DIAGNOSIS — D631 Anemia in chronic kidney disease: Secondary | ICD-10-CM | POA: Diagnosis not present

## 2021-01-04 DIAGNOSIS — Z48812 Encounter for surgical aftercare following surgery on the circulatory system: Secondary | ICD-10-CM | POA: Diagnosis not present

## 2021-01-04 DIAGNOSIS — N186 End stage renal disease: Secondary | ICD-10-CM | POA: Diagnosis not present

## 2021-01-04 DIAGNOSIS — I120 Hypertensive chronic kidney disease with stage 5 chronic kidney disease or end stage renal disease: Secondary | ICD-10-CM | POA: Diagnosis not present

## 2021-01-04 DIAGNOSIS — E1122 Type 2 diabetes mellitus with diabetic chronic kidney disease: Secondary | ICD-10-CM | POA: Diagnosis not present

## 2021-01-04 DIAGNOSIS — L03114 Cellulitis of left upper limb: Secondary | ICD-10-CM | POA: Diagnosis not present

## 2021-01-04 NOTE — Telephone Encounter (Signed)
[name] the personal CMA of the PT called to advise that starting last week the PT has been more forgetful, agaited, and rude. She is also more persistent on out of date info in regards to current situations such as old receipts from bank statements where she states she is losing money. The PT also has a open sore on her bottom that she refuses anyone to look at or treat other then the nurse that did 2 weeks ago. She also told the nurse who came out to look at her arm that she does not want her back and was very rude and would not say anything about the sore. [name] the [facility] can be contacted at [phone] for more info.

## 2021-01-05 DIAGNOSIS — N186 End stage renal disease: Secondary | ICD-10-CM | POA: Diagnosis not present

## 2021-01-05 DIAGNOSIS — D631 Anemia in chronic kidney disease: Secondary | ICD-10-CM | POA: Diagnosis not present

## 2021-01-05 DIAGNOSIS — N2581 Secondary hyperparathyroidism of renal origin: Secondary | ICD-10-CM | POA: Diagnosis not present

## 2021-01-05 DIAGNOSIS — D509 Iron deficiency anemia, unspecified: Secondary | ICD-10-CM | POA: Diagnosis not present

## 2021-01-05 DIAGNOSIS — Z992 Dependence on renal dialysis: Secondary | ICD-10-CM | POA: Diagnosis not present

## 2021-01-06 ENCOUNTER — Ambulatory Visit (INDEPENDENT_AMBULATORY_CARE_PROVIDER_SITE_OTHER): Payer: Medicare Other | Admitting: Nurse Practitioner

## 2021-01-06 ENCOUNTER — Encounter (INDEPENDENT_AMBULATORY_CARE_PROVIDER_SITE_OTHER): Payer: Medicare Other

## 2021-01-06 DIAGNOSIS — G309 Alzheimer's disease, unspecified: Secondary | ICD-10-CM | POA: Diagnosis not present

## 2021-01-06 DIAGNOSIS — I4891 Unspecified atrial fibrillation: Secondary | ICD-10-CM | POA: Diagnosis not present

## 2021-01-06 DIAGNOSIS — E440 Moderate protein-calorie malnutrition: Secondary | ICD-10-CM | POA: Diagnosis not present

## 2021-01-06 DIAGNOSIS — E559 Vitamin D deficiency, unspecified: Secondary | ICD-10-CM | POA: Diagnosis not present

## 2021-01-06 DIAGNOSIS — F0150 Vascular dementia without behavioral disturbance: Secondary | ICD-10-CM | POA: Diagnosis not present

## 2021-01-06 DIAGNOSIS — Z992 Dependence on renal dialysis: Secondary | ICD-10-CM | POA: Diagnosis not present

## 2021-01-06 DIAGNOSIS — E041 Nontoxic single thyroid nodule: Secondary | ICD-10-CM | POA: Diagnosis not present

## 2021-01-06 DIAGNOSIS — Z7984 Long term (current) use of oral hypoglycemic drugs: Secondary | ICD-10-CM | POA: Diagnosis not present

## 2021-01-06 DIAGNOSIS — N186 End stage renal disease: Secondary | ICD-10-CM | POA: Diagnosis not present

## 2021-01-06 DIAGNOSIS — L03114 Cellulitis of left upper limb: Secondary | ICD-10-CM | POA: Diagnosis not present

## 2021-01-06 DIAGNOSIS — I120 Hypertensive chronic kidney disease with stage 5 chronic kidney disease or end stage renal disease: Secondary | ICD-10-CM | POA: Diagnosis not present

## 2021-01-06 DIAGNOSIS — I10 Essential (primary) hypertension: Secondary | ICD-10-CM | POA: Diagnosis not present

## 2021-01-06 DIAGNOSIS — Z741 Need for assistance with personal care: Secondary | ICD-10-CM | POA: Diagnosis not present

## 2021-01-06 DIAGNOSIS — Z9181 History of falling: Secondary | ICD-10-CM | POA: Diagnosis not present

## 2021-01-06 DIAGNOSIS — I6529 Occlusion and stenosis of unspecified carotid artery: Secondary | ICD-10-CM | POA: Diagnosis not present

## 2021-01-06 DIAGNOSIS — E1122 Type 2 diabetes mellitus with diabetic chronic kidney disease: Secondary | ICD-10-CM | POA: Diagnosis not present

## 2021-01-06 DIAGNOSIS — Z48812 Encounter for surgical aftercare following surgery on the circulatory system: Secondary | ICD-10-CM | POA: Diagnosis not present

## 2021-01-06 DIAGNOSIS — D631 Anemia in chronic kidney disease: Secondary | ICD-10-CM | POA: Diagnosis not present

## 2021-01-06 DIAGNOSIS — Z95818 Presence of other cardiac implants and grafts: Secondary | ICD-10-CM | POA: Diagnosis not present

## 2021-01-06 DIAGNOSIS — Z7902 Long term (current) use of antithrombotics/antiplatelets: Secondary | ICD-10-CM | POA: Diagnosis not present

## 2021-01-06 DIAGNOSIS — D72819 Decreased white blood cell count, unspecified: Secondary | ICD-10-CM | POA: Diagnosis not present

## 2021-01-06 DIAGNOSIS — E785 Hyperlipidemia, unspecified: Secondary | ICD-10-CM | POA: Diagnosis not present

## 2021-01-06 DIAGNOSIS — I480 Paroxysmal atrial fibrillation: Secondary | ICD-10-CM | POA: Diagnosis not present

## 2021-01-06 DIAGNOSIS — I69318 Other symptoms and signs involving cognitive functions following cerebral infarction: Secondary | ICD-10-CM | POA: Diagnosis not present

## 2021-01-06 DIAGNOSIS — I63431 Cerebral infarction due to embolism of right posterior cerebral artery: Secondary | ICD-10-CM | POA: Diagnosis not present

## 2021-01-06 DIAGNOSIS — G479 Sleep disorder, unspecified: Secondary | ICD-10-CM | POA: Diagnosis not present

## 2021-01-06 DIAGNOSIS — N2581 Secondary hyperparathyroidism of renal origin: Secondary | ICD-10-CM | POA: Diagnosis not present

## 2021-01-06 DIAGNOSIS — F0280 Dementia in other diseases classified elsewhere without behavioral disturbance: Secondary | ICD-10-CM | POA: Diagnosis not present

## 2021-01-07 DIAGNOSIS — D631 Anemia in chronic kidney disease: Secondary | ICD-10-CM | POA: Diagnosis not present

## 2021-01-07 DIAGNOSIS — Z992 Dependence on renal dialysis: Secondary | ICD-10-CM | POA: Diagnosis not present

## 2021-01-07 DIAGNOSIS — N186 End stage renal disease: Secondary | ICD-10-CM | POA: Diagnosis not present

## 2021-01-07 DIAGNOSIS — D509 Iron deficiency anemia, unspecified: Secondary | ICD-10-CM | POA: Diagnosis not present

## 2021-01-07 DIAGNOSIS — N2581 Secondary hyperparathyroidism of renal origin: Secondary | ICD-10-CM | POA: Diagnosis not present

## 2021-01-07 NOTE — Telephone Encounter (Signed)
Noted.  We can discuss the memory difficulty at her follow-up.  Can you call the patient and get more details on the wound?  How long has it been there?  How big is it?  Which buttock is it on?  Is there any drainage?  Has she had any fevers?  Is there any significant pain at the wound site?

## 2021-01-10 DIAGNOSIS — Z992 Dependence on renal dialysis: Secondary | ICD-10-CM | POA: Diagnosis not present

## 2021-01-10 DIAGNOSIS — N2581 Secondary hyperparathyroidism of renal origin: Secondary | ICD-10-CM | POA: Diagnosis not present

## 2021-01-10 DIAGNOSIS — N186 End stage renal disease: Secondary | ICD-10-CM | POA: Diagnosis not present

## 2021-01-10 DIAGNOSIS — D509 Iron deficiency anemia, unspecified: Secondary | ICD-10-CM | POA: Diagnosis not present

## 2021-01-10 DIAGNOSIS — D631 Anemia in chronic kidney disease: Secondary | ICD-10-CM | POA: Diagnosis not present

## 2021-01-10 NOTE — Telephone Encounter (Signed)
Called and LM with her grandaughter that I would call back.  Tushar Enns,cma

## 2021-01-11 ENCOUNTER — Ambulatory Visit: Payer: Medicare Other | Admitting: Family Medicine

## 2021-01-11 NOTE — Telephone Encounter (Signed)
Called and no answer.  Brianna Dodson,cma

## 2021-01-12 DIAGNOSIS — Z992 Dependence on renal dialysis: Secondary | ICD-10-CM | POA: Diagnosis not present

## 2021-01-12 DIAGNOSIS — N186 End stage renal disease: Secondary | ICD-10-CM | POA: Diagnosis not present

## 2021-01-12 DIAGNOSIS — N2581 Secondary hyperparathyroidism of renal origin: Secondary | ICD-10-CM | POA: Diagnosis not present

## 2021-01-12 DIAGNOSIS — D509 Iron deficiency anemia, unspecified: Secondary | ICD-10-CM | POA: Diagnosis not present

## 2021-01-12 DIAGNOSIS — D631 Anemia in chronic kidney disease: Secondary | ICD-10-CM | POA: Diagnosis not present

## 2021-01-13 ENCOUNTER — Telehealth: Payer: Self-pay

## 2021-01-13 DIAGNOSIS — E1165 Type 2 diabetes mellitus with hyperglycemia: Secondary | ICD-10-CM | POA: Diagnosis not present

## 2021-01-13 DIAGNOSIS — E1169 Type 2 diabetes mellitus with other specified complication: Secondary | ICD-10-CM | POA: Diagnosis not present

## 2021-01-13 DIAGNOSIS — E1122 Type 2 diabetes mellitus with diabetic chronic kidney disease: Secondary | ICD-10-CM | POA: Diagnosis not present

## 2021-01-13 DIAGNOSIS — N184 Chronic kidney disease, stage 4 (severe): Secondary | ICD-10-CM | POA: Diagnosis not present

## 2021-01-13 DIAGNOSIS — I10 Essential (primary) hypertension: Secondary | ICD-10-CM | POA: Diagnosis not present

## 2021-01-13 DIAGNOSIS — E785 Hyperlipidemia, unspecified: Secondary | ICD-10-CM | POA: Diagnosis not present

## 2021-01-13 DIAGNOSIS — I120 Hypertensive chronic kidney disease with stage 5 chronic kidney disease or end stage renal disease: Secondary | ICD-10-CM | POA: Diagnosis not present

## 2021-01-13 DIAGNOSIS — Z48812 Encounter for surgical aftercare following surgery on the circulatory system: Secondary | ICD-10-CM | POA: Diagnosis not present

## 2021-01-13 DIAGNOSIS — D631 Anemia in chronic kidney disease: Secondary | ICD-10-CM | POA: Diagnosis not present

## 2021-01-13 DIAGNOSIS — L03114 Cellulitis of left upper limb: Secondary | ICD-10-CM | POA: Diagnosis not present

## 2021-01-13 DIAGNOSIS — N186 End stage renal disease: Secondary | ICD-10-CM | POA: Diagnosis not present

## 2021-01-13 NOTE — Telephone Encounter (Signed)
Pt's caregiver and granddaughter called and wanted to know about receiving supplies for wound. They were in the presence of the pt. I let them know that you tried to call a few times and it was noted in pt's chart. I also let them know that the appt on 01/11/21 was canceled by automated system. The pt stated that she was the one who pushed the button to cancel because she did not know what the appt was for. The granddaughter Tarahj gave me her number to please call back to discuss further. Her number is [phone]

## 2021-01-13 NOTE — Telephone Encounter (Signed)
Called and received no answer and no VM. Piero Mustard,cma

## 2021-01-13 NOTE — Telephone Encounter (Signed)
err

## 2021-01-14 DIAGNOSIS — N2581 Secondary hyperparathyroidism of renal origin: Secondary | ICD-10-CM | POA: Diagnosis not present

## 2021-01-14 DIAGNOSIS — Z992 Dependence on renal dialysis: Secondary | ICD-10-CM | POA: Diagnosis not present

## 2021-01-14 DIAGNOSIS — D631 Anemia in chronic kidney disease: Secondary | ICD-10-CM | POA: Diagnosis not present

## 2021-01-14 DIAGNOSIS — N186 End stage renal disease: Secondary | ICD-10-CM | POA: Diagnosis not present

## 2021-01-14 DIAGNOSIS — D509 Iron deficiency anemia, unspecified: Secondary | ICD-10-CM | POA: Diagnosis not present

## 2021-01-14 NOTE — Telephone Encounter (Signed)
I called the home health CMA and she stated that the patients granddaughter was out of town for 2 weeks prior til today and when the nurse was going to see the patient she was asking her if she needed anything or was she concerned about anything and she stated no, when the granddaughter came back she stated she had this sore on her buttocks, when the home health nurse came out after that she refused to let them see it, so the home health nurse asked if the granddaughter could take a pic of the wound and she refused.  So today I called the granddaughter and explained to her that someone has to see the wound in order to help her she stated that today a nurse came out and she was made to show it and it is healing properly but she wants to know if you could write a RX for a donut pillow because her tailbone hurts for sitting 4 hours in dyalisis.  Kalix Meinecke,cma

## 2021-01-14 NOTE — Telephone Encounter (Signed)
It is difficult to determine what the appropriate supplies would be without evaluating the wound. Does she have home health? We could have them complete a wound care evaluation and provide appropriate treatment.

## 2021-01-17 ENCOUNTER — Other Ambulatory Visit (INDEPENDENT_AMBULATORY_CARE_PROVIDER_SITE_OTHER): Payer: Self-pay | Admitting: Nurse Practitioner

## 2021-01-17 DIAGNOSIS — Z992 Dependence on renal dialysis: Secondary | ICD-10-CM | POA: Diagnosis not present

## 2021-01-17 DIAGNOSIS — N186 End stage renal disease: Secondary | ICD-10-CM

## 2021-01-17 DIAGNOSIS — N2581 Secondary hyperparathyroidism of renal origin: Secondary | ICD-10-CM | POA: Diagnosis not present

## 2021-01-17 DIAGNOSIS — D509 Iron deficiency anemia, unspecified: Secondary | ICD-10-CM | POA: Diagnosis not present

## 2021-01-17 DIAGNOSIS — D631 Anemia in chronic kidney disease: Secondary | ICD-10-CM | POA: Diagnosis not present

## 2021-01-17 NOTE — Telephone Encounter (Signed)
LVM for patient or granddaughter to call back. Brianna Dodson,cma

## 2021-01-17 NOTE — Telephone Encounter (Signed)
I do not believe they need a prescription for the doughnut pillow.  They should be able to go get on from a pharmacy or medical supply store.  They may want to check with her insurance if they cover it and certainly I can write a prescription.

## 2021-01-18 ENCOUNTER — Ambulatory Visit (INDEPENDENT_AMBULATORY_CARE_PROVIDER_SITE_OTHER): Payer: Medicare Other

## 2021-01-18 ENCOUNTER — Other Ambulatory Visit: Payer: Self-pay

## 2021-01-18 ENCOUNTER — Ambulatory Visit (INDEPENDENT_AMBULATORY_CARE_PROVIDER_SITE_OTHER): Payer: Medicare Other | Admitting: Nurse Practitioner

## 2021-01-18 VITALS — BP 154/71 | HR 66 | Ht 65.0 in | Wt 143.0 lb

## 2021-01-18 DIAGNOSIS — E785 Hyperlipidemia, unspecified: Secondary | ICD-10-CM

## 2021-01-18 DIAGNOSIS — N186 End stage renal disease: Secondary | ICD-10-CM | POA: Diagnosis not present

## 2021-01-18 DIAGNOSIS — I10 Essential (primary) hypertension: Secondary | ICD-10-CM

## 2021-01-18 NOTE — Telephone Encounter (Signed)
I received a call from he granddaughter and explained to her that a prescription for the donut pillow is not needed and she understood.  Hersey Maclellan,cma

## 2021-01-19 DIAGNOSIS — N2581 Secondary hyperparathyroidism of renal origin: Secondary | ICD-10-CM | POA: Diagnosis not present

## 2021-01-19 DIAGNOSIS — N186 End stage renal disease: Secondary | ICD-10-CM | POA: Diagnosis not present

## 2021-01-19 DIAGNOSIS — D631 Anemia in chronic kidney disease: Secondary | ICD-10-CM | POA: Diagnosis not present

## 2021-01-19 DIAGNOSIS — Z992 Dependence on renal dialysis: Secondary | ICD-10-CM | POA: Diagnosis not present

## 2021-01-19 DIAGNOSIS — D509 Iron deficiency anemia, unspecified: Secondary | ICD-10-CM | POA: Diagnosis not present

## 2021-01-20 ENCOUNTER — Telehealth: Payer: Self-pay | Admitting: Family Medicine

## 2021-01-20 ENCOUNTER — Ambulatory Visit: Payer: Medicare Other | Admitting: Family Medicine

## 2021-01-20 DIAGNOSIS — R410 Disorientation, unspecified: Secondary | ICD-10-CM | POA: Diagnosis not present

## 2021-01-20 DIAGNOSIS — Z48812 Encounter for surgical aftercare following surgery on the circulatory system: Secondary | ICD-10-CM | POA: Diagnosis not present

## 2021-01-20 DIAGNOSIS — D631 Anemia in chronic kidney disease: Secondary | ICD-10-CM | POA: Diagnosis not present

## 2021-01-20 DIAGNOSIS — F0280 Dementia in other diseases classified elsewhere without behavioral disturbance: Secondary | ICD-10-CM | POA: Diagnosis not present

## 2021-01-20 DIAGNOSIS — I120 Hypertensive chronic kidney disease with stage 5 chronic kidney disease or end stage renal disease: Secondary | ICD-10-CM | POA: Diagnosis not present

## 2021-01-20 DIAGNOSIS — N186 End stage renal disease: Secondary | ICD-10-CM | POA: Diagnosis not present

## 2021-01-20 DIAGNOSIS — F0150 Vascular dementia without behavioral disturbance: Secondary | ICD-10-CM | POA: Diagnosis not present

## 2021-01-20 DIAGNOSIS — E1122 Type 2 diabetes mellitus with diabetic chronic kidney disease: Secondary | ICD-10-CM | POA: Diagnosis not present

## 2021-01-20 DIAGNOSIS — G309 Alzheimer's disease, unspecified: Secondary | ICD-10-CM | POA: Diagnosis not present

## 2021-01-20 DIAGNOSIS — L03114 Cellulitis of left upper limb: Secondary | ICD-10-CM | POA: Diagnosis not present

## 2021-01-20 NOTE — Telephone Encounter (Addendum)
CMA triaged and attained vitals went into speak to patient to advise she was 18 minutes late for appointment but provider would see her but she would have to wait and be worked in to the schedule now. Patient had  a guest with her stated that she has a 1000 appt to have EC, I ask patient with cardiology she stated yes. I advised patient Dr. Caryl Bis would be glad to see her a second time but I was not sure as to how long the wait would be, she ask could he work her in this afternoon advised PCP not in office this afternoon. Patient could not do an appointment 01/21/21 due to dialysis and could not do ine 01/24/21 due to dialysis she could only come back on 726/22 patient reschedule do 1130 on 01/25/21. Patient was advised by this nurse to inform cardiology of her symptoms of dizzy and fatigue, and that if any worsening symptoms after ordering the weekend to seek medical attention UC or ER.  Vitals weight 147 lb 4 oz,  Temp 96.5, 02 sats 98% pulse 68, resp 15, BP 158/74, wound dressing on right should er clean intact and no drainage noted.

## 2021-01-20 NOTE — Telephone Encounter (Signed)
Reviewed.  Patient was offered a visit today though did not want to wait any period of time.  Patient was rescheduled.

## 2021-01-20 NOTE — Telephone Encounter (Signed)
Call received from home health RN.  She is with the patient now and the patient has been reporting dizzy spells that occur when she gets up from a seated position.  These have been going on since yesterday when her blood pressure bottomed out during dialysis.  The nurse notes that they advised her yesterday to decrease her Coreg dose to the bedtime dose just on Monday, Wednesday, and Friday.  Her blood pressure currently is 148/70.  The nurse notes the patient is a little agitated that home health is there and that may have driven her blood pressure up slightly.  The patient is not currently lightheaded or dizzy.  The nurse wonders about decreasing her hydralazine dose.  Patient has dialysis tomorrow.  Nurse is also going to have a PT eval done due to the patient reporting increased generalized weakness.  I advised that the PT eval would be okay.  Discussed that I would not make any further changes since they just changed her medication regimen yesterday.  I will have somebody contact the patient tomorrow afternoon to follow-up on how she is feeling.  Advised that the patient seek medical attention if she passes out.

## 2021-01-21 DIAGNOSIS — D631 Anemia in chronic kidney disease: Secondary | ICD-10-CM | POA: Diagnosis not present

## 2021-01-21 DIAGNOSIS — Z992 Dependence on renal dialysis: Secondary | ICD-10-CM | POA: Diagnosis not present

## 2021-01-21 DIAGNOSIS — N2581 Secondary hyperparathyroidism of renal origin: Secondary | ICD-10-CM | POA: Diagnosis not present

## 2021-01-21 DIAGNOSIS — N186 End stage renal disease: Secondary | ICD-10-CM | POA: Diagnosis not present

## 2021-01-21 DIAGNOSIS — D509 Iron deficiency anemia, unspecified: Secondary | ICD-10-CM | POA: Diagnosis not present

## 2021-01-21 NOTE — Telephone Encounter (Signed)
LVM to call back.  Tavia Stave,cma

## 2021-01-24 ENCOUNTER — Telehealth (INDEPENDENT_AMBULATORY_CARE_PROVIDER_SITE_OTHER): Payer: Self-pay

## 2021-01-24 ENCOUNTER — Encounter (INDEPENDENT_AMBULATORY_CARE_PROVIDER_SITE_OTHER): Payer: Self-pay | Admitting: Nurse Practitioner

## 2021-01-24 DIAGNOSIS — Z992 Dependence on renal dialysis: Secondary | ICD-10-CM | POA: Diagnosis not present

## 2021-01-24 DIAGNOSIS — D631 Anemia in chronic kidney disease: Secondary | ICD-10-CM | POA: Diagnosis not present

## 2021-01-24 DIAGNOSIS — D509 Iron deficiency anemia, unspecified: Secondary | ICD-10-CM | POA: Diagnosis not present

## 2021-01-24 DIAGNOSIS — N186 End stage renal disease: Secondary | ICD-10-CM | POA: Diagnosis not present

## 2021-01-24 DIAGNOSIS — N2581 Secondary hyperparathyroidism of renal origin: Secondary | ICD-10-CM | POA: Diagnosis not present

## 2021-01-24 NOTE — Telephone Encounter (Signed)
Rayna Sexton, granddaughter to patient returned Friday's phone call from [place], phone # [phone]. See note below.

## 2021-01-24 NOTE — Telephone Encounter (Signed)
The granddaughter called back and I called her back, she stated the patient woke up last night at 3 am and stated she was lightheaded and dizzy, she also stated she had a headache, the granddaughter stated she did not give her any medication but she took her BP and it was 121/73. She also stated her grandmother is really aggressive toward her, she is cursing her and demanding things.  She wanted result from the neurologist, she stated patient had a procedure done, I informed her to call the neurologist for those results.    Patient does have a visit tomorrow and she will be here.  Crisanto Nied,cma

## 2021-01-24 NOTE — Telephone Encounter (Signed)
Noted.  We will plan to discuss further tomorrow.

## 2021-01-24 NOTE — Telephone Encounter (Signed)
I attempted to contact the patient regarding the message that was left earlier this morning stating the patient did not know what type of surgery she was having. Patient was seen by Eulogio Ditch NP on 01/18/21 and it was discussed to have a right brachial axillary graft. I will call the patient again on 01/25/21.

## 2021-01-24 NOTE — Progress Notes (Signed)
Subjective:    [name], female    DOB: May 18, 1934, 87 y.o.   MRN: [id number] Chief Complaint  Patient presents with   Follow-up    6 wk HDA    Brianna Dodson is an 87 year old female that presents today for follow-up evaluation of her left brachiocephalic AV fistula.  This was placed on 12/02/2020.  Since that time the fistula has not shown any evidence of maturation.  At last office visit there is a very faint thrill but no bruit auscultated.  Today a bruit cannot be heard a thrill could not be felt.  Initially following placement of her AV fistula the patient has severe swelling of her left upper extremity.  Ranging from her shoulder to her fingertips.  She also had severe swelling and redness over the incision site.  Today the swelling is all resolved as is the redness at the incision site.  The patient currently is maintained via PermCath which is still continuing to work well.  Today noninvasive studies show that the patient has a flow volume of 150.  There is a severe stenosis at the anastomosis site.  But it is also noted that the patient has a less than 2 mm cephalic vein making it very small.  At the proximal upper arm is only 1 mm   Review of Systems  Neurological:  Positive for weakness.  Hematological:  Bruises/bleeds easily.  All other systems reviewed and are negative.     Objective:   Physical Exam Vitals reviewed.  HENT:     Head: Normocephalic.  Cardiovascular:     Rate and Rhythm: Normal rate.     Pulses: Normal pulses.          Radial pulses are 2+ on the left side.  Neurological:     Mental Status: She is alert and oriented to person, place, and time. Mental status is at baseline.     Motor: Weakness present.     Gait: Gait abnormal.     Comments: Aphasia  Psychiatric:        Mood and Affect: Mood normal.        Behavior: Behavior normal.        Thought Content: Thought content normal.        Judgment: Judgment normal.    BP (!) 154/71    Pulse 66   Ht 5\' 5"  (1.651 m)   Wt 143 lb (64.9 kg)   BMI 23.80 kg/m   Past Medical History:  Diagnosis Date   A-fib ([place])    Alzheimer disease ([place])    Anemia    Carotid stenosis    Cerebral infarction ([place]) 04/12/2019   Multiple new foci of acute infarction affect the RIGHT hemisphere affecting the frontal, posterior frontal, posterior temporal, anterior parietal cortex and regional white matter.   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/2019   RIGHT MCA distal M1   Chronic anticoagulation    Apixaban   CKD (chronic kidney disease), stage 4(HCC)    Embolic stroke involving cerebral artery ([place]) 03/15/2019   RIGHT MCA/PCA territory   Encephalomalacia    RIGHT posterior MCA territory   HCV (hepatitis C virus)    History of blood transfusion    Hyperlipidemia    Hypertension    OSA (obstructive sleep apnea)    Status post placement of implantable loop recorder 07/30/2019   T2DM (type 2 diabetes mellitus) ([place])     Social History  Socioeconomic History   Marital status: Married    Spouse name: Not on file   Number of children: 2   Years of education: Not on file   Highest education level: Not on file  Occupational History   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    Hobbies: Decorating   Exercise: None   Caffeine: 4 cups a week   Social Determinants of Radio broadcast assistant Strain: Low Risk    Difficulty of Paying Living Expenses: Not hard at all  Food Insecurity: Not on file  Transportation Needs: Not on file  Physical Activity: Not on file  Stress: Not on file  Social  Connections: Not on file  Intimate Partner Violence: Not on file    Past Surgical History:  Procedure Laterality Date   [place] Left 12/02/2020   Procedure: INSERTION OF ARTERIOVENOUS (AV)  FISTULA  ARM ( BRACHIO- CEPHALIC);  Surgeon: Algernon Huxley, MD;  Location: [facility];  Service: Vascular;  Laterality: Left;   DIALYSIS/PERMA CATHETER INSERTION N/A 10/06/2020   Procedure: DIALYSIS/PERMA CATHETER INSERTION;  Surgeon: Algernon Huxley, MD;  Location: [place] [facility];  Service: Cardiovascular;  Laterality: N/A;   LOOP RECORDER INSERTION N/A 07/30/2019   Procedure: LOOP RECORDER INSERTION;  Surgeon: Isaias Cowman, MD;  Location: [place] [facility];  Service: Cardiovascular;  Laterality: N/A;   TEE WITHOUT CARDIOVERSION N/A 04/14/2019   Procedure: TRANSESOPHAGEAL ECHOCARDIOGRAM (TEE);  Surgeon: Teodoro Spray, MD;  Location: [facility];  Service: Cardiovascular;  Laterality: N/A;    Family History  Problem Relation Age of Onset   Stroke Mother    Diabetes Mother    Heart disease Father    Kidney disease Sister    Diabetes Sister    Kidney disease Brother    Heart disease Sister    Diabetes Sister    Diabetes Sister    Diabetes Sister    Kidney disease Sister    Heart disease Sister    Kidney disease Brother        kidney transplant   Early death Brother 52       Truck Accident - died   Heart disease Brother     Allergies  Allergen Reactions   Nsaids     CKD stage III - Avoid all nephrotoxic drugs    CBC Latest Ref Rng & Units 12/05/2020 12/02/2020 11/30/2020  WBC 4.0 - 10.5 K/uL 3.5(L) - 3.1(L)  Hemoglobin 12.0 - 15.0 g/dL 9.9(L) 11.2(L) 10.1(L)  Hematocrit 36.0 - 46.0 % 28.6(L) 33.0(L) 30.4(L)  Platelets 150 - 400 K/uL 145(L) - 135(L)      CMP     Component Value Date/Time   NA 138 12/05/2020 0948   NA 142 11/10/2013 0522   K 3.1 (L) 12/05/2020 0948   K 4.6 11/10/2013 0522   CL 99 12/05/2020 0948   CL 110 (H)  11/10/2013 0522   CO2 28 12/05/2020 0948   CO2 27 11/10/2013 0522   GLUCOSE 168 (H) 12/05/2020 0948   GLUCOSE 70 11/10/2013  0522   BUN 14 12/05/2020 0948   BUN 22 (H) 11/10/2013 0522   CREATININE 3.17 (H) 12/05/2020 0948   CREATININE 1.12 11/10/2013 0522   CALCIUM 8.6 (L) 12/05/2020 0948   CALCIUM 8.4 (L) 11/10/2013 0522   PROT 7.2 06/09/2020 1145   PROT 9.0 (H) 11/09/2013 1540   ALBUMIN 3.9 06/09/2020 1145   ALBUMIN 3.8 11/09/2013 1540   AST 21 06/09/2020 1145   AST 29 11/09/2013 1540   ALT 20 06/09/2020 1145   ALT 27 11/09/2013 1540   ALKPHOS 48 06/09/2020 1145   ALKPHOS 59 11/09/2013 1540   BILITOT 0.6 06/09/2020 1145   BILITOT 0.2 11/09/2013 1540   GFRNONAA 14 (L) 12/05/2020 0948   GFRNONAA 47 (L) 11/10/2013 0522   GFRAA 27 (L) 08/22/2019 0449   GFRAA 54 (L) 11/10/2013 0522     No results found.     Assessment & Plan:   1. ESRD (end stage renal disease) ([place])  Discussion with the patient as well as her family member about the next steps for her dialysis access.  Unfortunately, the studies today showed that while she does have flow within her brachiocephalic AV fistula there is a severe stenosis at the anastomosis site but the cephalic vein itself is very small throughout.  This small diameter cephalic vein increases the likelihood that this fistula will not be able to mature.  We discussed several options available to the patient.  The first option would be a fistulogram to see if by fixing the anastomosis stenosis, if that would allow the fistula to mature.  However due to small diameter this is not likely to be successful.  There is probably less than 5% chance of success with this option.  The second option was to excise the current fistula and replace it with a graft in the left upper extremity.  The concern with this is the amount of swelling the patient had following her first procedure.  It is possible that if there is some level of central stenosis that had the  patient may swell extensively if another access were to be placed in that arm.  This would also be a much more invasive surgery with a lengthy recovery process.  The third option was to place an AV graft in her right upper extremity.  Previous vein mapping showed that this would be adequate for the patient will likely lead to a higher chance of success as well as a shorter recovery time frame.  Lastly, it was discussed about leaving the patient catheter dependent.  We discussed that the risk of infection is very high with long-term catheters and that typically these infections have the possibility to be severe or even fatal.  We also discussed the possibility of central stenosis that could lead to superior vena cava syndrome with long-term PermCath access.  Typically the lifespan of a patient with a prominent dialysis catheter is reduced.  After discussing the risk, benefits and alternatives of each procedure, the patient opts to go with placement of an AV graft in her right upper extremity.  The risk, benefits and alternatives of this procedure were again reiterated with the patient.  All questions answered.  The patient does agree to proceed with surgery.  Patient will follow-up in office following surgery.  2. Essential hypertension Continue antihypertensive medications as already ordered, these medications have been reviewed and there are no changes at this time.   3. Hyperlipidemia, unspecified hyperlipidemia type Continue statin as ordered and reviewed, no changes  at this time    Current Outpatient Medications on File Prior to Visit  Medication Sig Dispense Refill   amLODipine (NORVASC) 10 MG tablet Take 1 tablet (10 mg total) by mouth daily. 90 tablet 0   atorvastatin (LIPITOR) 80 MG tablet Take 1 tablet (80 mg total) by mouth daily. 90 tablet 3   atorvastatin (LIPITOR) 80 MG tablet Take by mouth.     BESIVANCE 0.6 % SUSP Apply to eye.     carvedilol (COREG) 6.25 MG tablet TAKE 1 TABLET  (6.25 MG TOTAL) BY MOUTH 2 (TWO) TIMES DAILY WITH A MEAL. 180 tablet 1   ELIQUIS 2.5 MG TABS tablet Take 1 tablet (2.5 mg total) by mouth 2 (two) times daily. 56 tablet 0   ezetimibe (ZETIA) 10 MG tablet TAKE 1 TABLET BY MOUTH EVERY DAY 90 tablet 3   Ferrous Sulfate (IRON) 28 MG TABS Take 28 mg by mouth daily at 12 noon.     glucose blood (ACCU-CHEK GUIDE) test strip USE TO CHECK BLOOD SUGARS TWICE DAILY. E11.9 300 each 3   hydrALAZINE (APRESOLINE) 50 MG tablet Take 50 mg by mouth 2 (two) times daily.     iron sucrose in sodium chloride 0.9 % 100 mL Iron Sucrose (Venofer)     Lancets (ONETOUCH ULTRASOFT) lancets Use as instructed. Use patient insurance preferred brand. 300 each 3   linagliptin (TRADJENTA) 5 MG TABS tablet Take 1 tablet (5 mg total) by mouth daily. 90 tablet 3   losartan (COZAAR) 50 MG tablet Take 50 mg by mouth daily.     memantine (NAMENDA) 5 MG tablet Take 5 mg by mouth every evening.     mirtazapine (REMERON) 7.5 MG tablet Take 7.5 mg by mouth at bedtime.     Multiple Vitamins-Minerals (MULTI COMPLETE/IRON) TABS Take by mouth.     thiamine 100 MG tablet Take by mouth.     Vitamin D, Ergocalciferol, (DRISDOL) 50000 units CAPS capsule Take 50,000 Units by mouth every 30 (thirty) days.  1   No current facility-administered medications on file prior to visit.    There are no Patient Instructions on file for this visit. No follow-ups on file.   Kris Hartmann, NP

## 2021-01-25 ENCOUNTER — Ambulatory Visit (INDEPENDENT_AMBULATORY_CARE_PROVIDER_SITE_OTHER): Payer: Medicare Other | Admitting: Family Medicine

## 2021-01-25 ENCOUNTER — Encounter: Payer: Self-pay | Admitting: Family Medicine

## 2021-01-25 ENCOUNTER — Other Ambulatory Visit: Payer: Self-pay

## 2021-01-25 DIAGNOSIS — M533 Sacrococcygeal disorders, not elsewhere classified: Secondary | ICD-10-CM | POA: Diagnosis not present

## 2021-01-25 DIAGNOSIS — N186 End stage renal disease: Secondary | ICD-10-CM | POA: Diagnosis not present

## 2021-01-25 DIAGNOSIS — I10 Essential (primary) hypertension: Secondary | ICD-10-CM

## 2021-01-25 DIAGNOSIS — L03114 Cellulitis of left upper limb: Secondary | ICD-10-CM | POA: Diagnosis not present

## 2021-01-25 DIAGNOSIS — I639 Cerebral infarction, unspecified: Secondary | ICD-10-CM | POA: Diagnosis not present

## 2021-01-25 DIAGNOSIS — E1122 Type 2 diabetes mellitus with diabetic chronic kidney disease: Secondary | ICD-10-CM | POA: Diagnosis not present

## 2021-01-25 DIAGNOSIS — I120 Hypertensive chronic kidney disease with stage 5 chronic kidney disease or end stage renal disease: Secondary | ICD-10-CM | POA: Diagnosis not present

## 2021-01-25 DIAGNOSIS — Z48812 Encounter for surgical aftercare following surgery on the circulatory system: Secondary | ICD-10-CM | POA: Diagnosis not present

## 2021-01-25 DIAGNOSIS — D631 Anemia in chronic kidney disease: Secondary | ICD-10-CM | POA: Diagnosis not present

## 2021-01-25 MED ORDER — AMLODIPINE BESYLATE 10 MG PO TABS: 5.0000 mg | ORAL_TABLET | Freq: Every day | ORAL | 1 refills | Status: DC

## 2021-01-25 NOTE — Telephone Encounter (Signed)
Spoke with the patient's sister and she wanted the patient to come in and speak with Dr. [name] because she was not understanding what was said to her on her last appt with Eulogio Ditch NP. Patient will have an upcoming appt with Dr. [name].

## 2021-01-25 NOTE — Progress Notes (Signed)
Brianna Rumps, MD Phone: [phone]  Brianna Dodson is a 87 y.o. female who presents today for follow-up.  Hypertension/lightheadedness: Patient notes this started after dialysis last week.  Her blood pressure bottomed out and she had a syncopal episode with this while at dialysis.  She notes she had this occur again while at dialysis though no syncope the second time.  She notes she gets lightheaded when she sits up in bed though has no other lightheadedness.  No chest pain, shortness of breath, or palpitations.  She is on amlodipine, carvedilol, hydralazine, and losartan.  Her sister reports that the vascular nurse practitioner advised her to only take 1 dose of carvedilol the day of her dialysis instead of the regular 2 doses.  Coccyx wound: Patient had an open wound in this area.  It has healed up.  She continues to have coccydynia.  She just recently got a donut pillow to help with this.  Social History   Tobacco Use  Smoking Status Never  Smokeless Tobacco Never    Current Outpatient Medications on File Prior to Visit  Medication Sig Dispense Refill   atorvastatin (LIPITOR) 80 MG tablet Take 1 tablet (80 mg total) by mouth daily. 90 tablet 3   atorvastatin (LIPITOR) 80 MG tablet Take by mouth.     BESIVANCE 0.6 % SUSP Apply to eye.     carvedilol (COREG) 6.25 MG tablet TAKE 1 TABLET (6.25 MG TOTAL) BY MOUTH 2 (TWO) TIMES DAILY WITH A MEAL. 180 tablet 1   ELIQUIS 2.5 MG TABS tablet Take 1 tablet (2.5 mg total) by mouth 2 (two) times daily. 56 tablet 0   ezetimibe (ZETIA) 10 MG tablet TAKE 1 TABLET BY MOUTH EVERY DAY 90 tablet 3   Ferrous Sulfate (IRON) 28 MG TABS Take 28 mg by mouth daily at 12 noon.     glucose blood (ACCU-CHEK GUIDE) test strip USE TO CHECK BLOOD SUGARS TWICE DAILY. E11.9 300 each 3   hydrALAZINE (APRESOLINE) 50 MG tablet Take 50 mg by mouth 2 (two) times daily.     iron sucrose in sodium chloride 0.9 % 100 mL Iron Sucrose (Venofer)     Lancets (ONETOUCH  ULTRASOFT) lancets Use as instructed. Use patient insurance preferred brand. 300 each 3   linagliptin (TRADJENTA) 5 MG TABS tablet Take 1 tablet (5 mg total) by mouth daily. 90 tablet 3   losartan (COZAAR) 50 MG tablet Take 50 mg by mouth daily.     memantine (NAMENDA) 5 MG tablet Take 5 mg by mouth every evening.     mirtazapine (REMERON) 7.5 MG tablet Take 7.5 mg by mouth at bedtime.     Multiple Vitamins-Minerals (MULTI COMPLETE/IRON) TABS Take by mouth.     thiamine 100 MG tablet Take by mouth.     Vitamin D, Ergocalciferol, (DRISDOL) 50000 units CAPS capsule Take 50,000 Units by mouth every 30 (thirty) days.  1   No current facility-administered medications on file prior to visit.     ROS see history of present illness  Objective  Physical Exam Vitals:   01/25/21 1119  BP: (!) 110/50  Pulse: 70  Temp: 98.4 F (36.9 C)  SpO2: 97%    BP Readings from Last 3 Encounters:  01/25/21 (!) 110/50  01/18/21 (!) 154/71  12/21/20 (!) 144/63   Wt Readings from Last 3 Encounters:  01/25/21 144 lb 9.6 oz (65.6 kg)  01/18/21 143 lb (64.9 kg)  12/21/20 149 lb 9.6 oz (67.9 kg)    Physical  Exam Constitutional:      General: She is not in acute distress.    Appearance: She is not diaphoretic.  Cardiovascular:     Rate and Rhythm: Normal rate and regular rhythm.     Heart sounds: Normal heart sounds.     Comments: No carotid bruits Pulmonary:     Effort: Pulmonary effort is normal.     Breath sounds: Normal breath sounds.  Musculoskeletal:     Comments: No wound overlying coccyx, slight tenderness at the coccyx  Skin:    General: Skin is warm and dry.  Neurological:     Mental Status: She is alert.     Assessment/Plan: Please see individual problem list.  Problem List Items Addressed This Visit     Coccydynia    She did have an open wound of the wound appears to be well-healed at this time.  Discussed continuing use of the doughnut pillow.  She can use barrier cream as  well.  If its not improving she will let us know.       Essential hypertension    The patient has continued to have issues with lightheadedness after her blood pressure bottomed out last week.  This only occurs on rising and is likely related to orthostasis.  We will decrease her amlodipine to 5 mg once daily.  She will continue her carvedilol 6.25 mg once daily on days with dialysis and twice daily the rest of the week.  She will also continue losartan 50 mg once daily.  She will return in 6 weeks.  I did advise follow-up in 2 to 3 weeks though she declines this.  They will contact us with her blood pressure readings around the 2 to 3 weeks timeframe.       Relevant Medications   amLODipine (NORVASC) 10 MG tablet    Return in about 6 weeks (around 03/08/2021) for Blood pressure.  This visit occurred during the SARS-CoV-2 public health emergency.  Safety protocols were in place, including screening questions prior to the visit, additional usage of staff PPE, and extensive cleaning of exam room while observing appropriate contact time as indicated for disinfecting solutions.    Brianna Rumps, MD [place]

## 2021-01-25 NOTE — Assessment & Plan Note (Signed)
She did have an open wound of the wound appears to be well-healed at this time.  Discussed continuing use of the doughnut pillow.  She can use barrier cream as well.  If its not improving she will let us know.

## 2021-01-25 NOTE — Patient Instructions (Signed)
Nice to see you. Please decrease the amlodipine dose to 5 mg once daily.  This will be half of a tablet.  Please contact us in 1 to 2 weeks with your blood pressure readings.  If you continue to feel lightheaded or if your blood pressure does not trend up please let me know. Please sit on the doughnut pillow every time you are seated until your tailbone starts to feel better.

## 2021-01-25 NOTE — Assessment & Plan Note (Signed)
The patient has continued to have issues with lightheadedness after her blood pressure bottomed out last week.  This only occurs on rising and is likely related to orthostasis.  We will decrease her amlodipine to 5 mg once daily.  She will continue her carvedilol 6.25 mg once daily on days with dialysis and twice daily the rest of the week.  She will also continue losartan 50 mg once daily.  She will return in 6 weeks.  I did advise follow-up in 2 to 3 weeks though she declines this.  They will contact us with her blood pressure readings around the 2 to 3 weeks timeframe.

## 2021-01-26 DIAGNOSIS — N186 End stage renal disease: Secondary | ICD-10-CM | POA: Diagnosis not present

## 2021-01-26 DIAGNOSIS — D631 Anemia in chronic kidney disease: Secondary | ICD-10-CM | POA: Diagnosis not present

## 2021-01-26 DIAGNOSIS — D509 Iron deficiency anemia, unspecified: Secondary | ICD-10-CM | POA: Diagnosis not present

## 2021-01-26 DIAGNOSIS — Z992 Dependence on renal dialysis: Secondary | ICD-10-CM | POA: Diagnosis not present

## 2021-01-26 DIAGNOSIS — N2581 Secondary hyperparathyroidism of renal origin: Secondary | ICD-10-CM | POA: Diagnosis not present

## 2021-01-27 DIAGNOSIS — E1122 Type 2 diabetes mellitus with diabetic chronic kidney disease: Secondary | ICD-10-CM | POA: Diagnosis not present

## 2021-01-27 DIAGNOSIS — L03114 Cellulitis of left upper limb: Secondary | ICD-10-CM | POA: Diagnosis not present

## 2021-01-27 DIAGNOSIS — I120 Hypertensive chronic kidney disease with stage 5 chronic kidney disease or end stage renal disease: Secondary | ICD-10-CM | POA: Diagnosis not present

## 2021-01-27 DIAGNOSIS — D631 Anemia in chronic kidney disease: Secondary | ICD-10-CM | POA: Diagnosis not present

## 2021-01-27 DIAGNOSIS — Z48812 Encounter for surgical aftercare following surgery on the circulatory system: Secondary | ICD-10-CM | POA: Diagnosis not present

## 2021-01-27 DIAGNOSIS — N186 End stage renal disease: Secondary | ICD-10-CM | POA: Diagnosis not present

## 2021-01-28 DIAGNOSIS — D509 Iron deficiency anemia, unspecified: Secondary | ICD-10-CM | POA: Diagnosis not present

## 2021-01-28 DIAGNOSIS — D631 Anemia in chronic kidney disease: Secondary | ICD-10-CM | POA: Diagnosis not present

## 2021-01-28 DIAGNOSIS — Z992 Dependence on renal dialysis: Secondary | ICD-10-CM | POA: Diagnosis not present

## 2021-01-28 DIAGNOSIS — N2581 Secondary hyperparathyroidism of renal origin: Secondary | ICD-10-CM | POA: Diagnosis not present

## 2021-01-28 DIAGNOSIS — N186 End stage renal disease: Secondary | ICD-10-CM | POA: Diagnosis not present

## 2021-01-30 DIAGNOSIS — N186 End stage renal disease: Secondary | ICD-10-CM | POA: Diagnosis not present

## 2021-01-30 DIAGNOSIS — Z992 Dependence on renal dialysis: Secondary | ICD-10-CM | POA: Diagnosis not present

## 2021-01-31 DIAGNOSIS — Z992 Dependence on renal dialysis: Secondary | ICD-10-CM | POA: Diagnosis not present

## 2021-01-31 DIAGNOSIS — D509 Iron deficiency anemia, unspecified: Secondary | ICD-10-CM | POA: Diagnosis not present

## 2021-01-31 DIAGNOSIS — D631 Anemia in chronic kidney disease: Secondary | ICD-10-CM | POA: Diagnosis not present

## 2021-01-31 DIAGNOSIS — N2581 Secondary hyperparathyroidism of renal origin: Secondary | ICD-10-CM | POA: Diagnosis not present

## 2021-01-31 DIAGNOSIS — N186 End stage renal disease: Secondary | ICD-10-CM | POA: Diagnosis not present

## 2021-02-01 ENCOUNTER — Encounter (INDEPENDENT_AMBULATORY_CARE_PROVIDER_SITE_OTHER): Payer: Self-pay | Admitting: Vascular Surgery

## 2021-02-01 ENCOUNTER — Other Ambulatory Visit: Payer: Self-pay

## 2021-02-01 ENCOUNTER — Ambulatory Visit (INDEPENDENT_AMBULATORY_CARE_PROVIDER_SITE_OTHER): Payer: Medicare Other | Admitting: Vascular Surgery

## 2021-02-01 VITALS — BP 167/64 | HR 76 | Ht 65.0 in | Wt 146.0 lb

## 2021-02-01 DIAGNOSIS — N186 End stage renal disease: Secondary | ICD-10-CM | POA: Diagnosis not present

## 2021-02-01 DIAGNOSIS — Z48812 Encounter for surgical aftercare following surgery on the circulatory system: Secondary | ICD-10-CM | POA: Diagnosis not present

## 2021-02-01 DIAGNOSIS — I120 Hypertensive chronic kidney disease with stage 5 chronic kidney disease or end stage renal disease: Secondary | ICD-10-CM | POA: Diagnosis not present

## 2021-02-01 DIAGNOSIS — E1122 Type 2 diabetes mellitus with diabetic chronic kidney disease: Secondary | ICD-10-CM | POA: Diagnosis not present

## 2021-02-01 DIAGNOSIS — Z992 Dependence on renal dialysis: Secondary | ICD-10-CM

## 2021-02-01 DIAGNOSIS — D631 Anemia in chronic kidney disease: Secondary | ICD-10-CM | POA: Diagnosis not present

## 2021-02-01 DIAGNOSIS — E785 Hyperlipidemia, unspecified: Secondary | ICD-10-CM

## 2021-02-01 DIAGNOSIS — L03114 Cellulitis of left upper limb: Secondary | ICD-10-CM | POA: Diagnosis not present

## 2021-02-01 DIAGNOSIS — I10 Essential (primary) hypertension: Secondary | ICD-10-CM

## 2021-02-01 NOTE — Progress Notes (Signed)
MRN : [id number]  Brianna Dodson is a 87 y.o. (August 28, 1933) female who presents with chief complaint of  Chief Complaint  Patient presents with   Follow-up    Discuss surgery  .  History of Present Illness: Patient returns today in follow up of her dialysis access.  She is here to discuss her access options.  Last month, she was seen and wanted to have a right upper arm AV graft placed because of the pain and swelling in her left arm after the failed fistula in the brachiocephalic location.  Her pain and swelling have resolved in the left arm.  She desires now to go back into the left arm which is her nondominant arm.  Still using her right jugular PermCath.  No new complaints today  Current Outpatient Medications  Medication Sig Dispense Refill   amLODipine (NORVASC) 10 MG tablet Take 0.5 tablets (5 mg total) by mouth daily. 45 tablet 1   atorvastatin (LIPITOR) 80 MG tablet Take 1 tablet (80 mg total) by mouth daily. 90 tablet 3   atorvastatin (LIPITOR) 80 MG tablet Take by mouth.     BESIVANCE 0.6 % SUSP Apply to eye.     carvedilol (COREG) 6.25 MG tablet TAKE 1 TABLET (6.25 MG TOTAL) BY MOUTH 2 (TWO) TIMES DAILY WITH A MEAL. 180 tablet 1   ELIQUIS 2.5 MG TABS tablet Take 1 tablet (2.5 mg total) by mouth 2 (two) times daily. 56 tablet 0   ezetimibe (ZETIA) 10 MG tablet TAKE 1 TABLET BY MOUTH EVERY DAY 90 tablet 3   Ferrous Sulfate (IRON) 28 MG TABS Take 28 mg by mouth daily at 12 noon.     glucose blood (ACCU-CHEK GUIDE) test strip USE TO CHECK BLOOD SUGARS TWICE DAILY. E11.9 300 each 3   hydrALAZINE (APRESOLINE) 50 MG tablet Take 50 mg by mouth 2 (two) times daily.     iron sucrose in sodium chloride 0.9 % 100 mL Iron Sucrose (Venofer)     Lancets (ONETOUCH ULTRASOFT) lancets Use as instructed. Use patient insurance preferred brand. 300 each 3   linagliptin (TRADJENTA) 5 MG TABS tablet Take 1 tablet (5 mg total) by mouth daily. 90 tablet 3   losartan (COZAAR) 50 MG tablet Take 50 mg by  mouth daily.     memantine (NAMENDA) 5 MG tablet Take 5 mg by mouth every evening.     Methoxy PEG-Epoetin Beta (MIRCERA IJ) Mircera     mirtazapine (REMERON) 7.5 MG tablet Take 7.5 mg by mouth at bedtime.     Multiple Vitamins-Minerals (MULTI COMPLETE/IRON) TABS Take by mouth.     thiamine 100 MG tablet Take by mouth.     Vitamin D, Ergocalciferol, (DRISDOL) 50000 units CAPS capsule Take 50,000 Units by mouth every 30 (thirty) days.  1   No current facility-administered medications for this visit.    Past Medical History:  Diagnosis Date   A-fib ([place])    Alzheimer disease ([place])    Anemia    Carotid stenosis    Cerebral infarction ([place]) 04/12/2019   Multiple new foci of acute infarction affect the RIGHT hemisphere affecting the frontal, posterior frontal, posterior temporal, anterior parietal cortex and regional white matter.   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/2019   RIGHT MCA distal M1   Chronic anticoagulation    Apixaban   CKD (chronic kidney disease), stage 4(HCC)    Embolic stroke involving cerebral artery ([place]) 03/15/2019   RIGHT MCA/PCA  territory   Encephalomalacia    RIGHT posterior MCA territory   HCV (hepatitis C virus)    History of blood transfusion    Hyperlipidemia    Hypertension    OSA (obstructive sleep apnea)    Status post placement of implantable loop recorder 07/30/2019   T2DM (type 2 diabetes mellitus) ([place])     Past Surgical History:  Procedure Laterality Date   ABDOMINAL HYSTERECTOMY  1985   AV FISTULA PLACEMENT Left 12/02/2020   Procedure: INSERTION OF ARTERIOVENOUS (AV)  FISTULA  ARM ( BRACHIO- CEPHALIC);  Surgeon: Algernon Huxley, MD;  Location: [facility];  Service: Vascular;  Laterality: Left;   DIALYSIS/PERMA CATHETER INSERTION N/A 10/06/2020   Procedure: DIALYSIS/PERMA CATHETER INSERTION;  Surgeon: Algernon Huxley, MD;  Location: [facility];  Service: Cardiovascular;  Laterality: N/A;   LOOP  RECORDER INSERTION N/A 07/30/2019   Procedure: LOOP RECORDER INSERTION;  Surgeon: Isaias Cowman, MD;  Location: [facility];  Service: Cardiovascular;  Laterality: N/A;   TEE WITHOUT CARDIOVERSION N/A 04/14/2019   Procedure: TRANSESOPHAGEAL ECHOCARDIOGRAM (TEE);  Surgeon: Teodoro Spray, MD;  Location: [facility];  Service: Cardiovascular;  Laterality: N/A;     Social History   Tobacco Use   Smoking status: Never   Smokeless tobacco: Never  Vaping Use   Vaping Use: Never used  Substance Use Topics   Alcohol use: No   Drug use: No       Family History  Problem Relation Age of Onset   Stroke Mother    Diabetes Mother    Heart disease Father    Kidney disease Sister    Diabetes Sister    Kidney disease Brother    Heart disease Sister    Diabetes Sister    Diabetes Sister    Diabetes Sister    Kidney disease Sister    Heart disease Sister    Kidney disease Brother        kidney transplant   Early death Brother 64       Truck Accident - died   Heart disease Brother     Allergies  Allergen Reactions   Nsaids     CKD stage III - Avoid all nephrotoxic drugs     REVIEW OF SYSTEMS (Negative unless checked)  Constitutional: [] Weight loss  [] Fever  [] Chills Cardiac: [] Chest pain   [] Chest pressure   [] Palpitations   [] Shortness of breath when laying flat   [] Shortness of breath at rest   [] Shortness of breath with exertion. Vascular:  [] Pain in legs with walking   [] Pain in legs at rest   [] Pain in legs when laying flat   [] Claudication   [] Pain in feet when walking  [] Pain in feet at rest  [] Pain in feet when laying flat   [] History of DVT   [] Phlebitis   [] Swelling in legs   [] Varicose veins   [] Non-healing ulcers Pulmonary:   [] Uses home oxygen   [] Productive cough   [] Hemoptysis   [] Wheeze  [] COPD   [] Asthma Neurologic:  [] Dizziness  [] Blackouts   [] Seizures   [] History of stroke   [] History of TIA  [] Aphasia   [] Temporary blindness   [] Dysphagia    [] Weakness or numbness in arms   [] Weakness or numbness in legs Musculoskeletal:  [x] Arthritis   [] Joint swelling   [x] Joint pain   [] Low back pain Hematologic:  [] Easy bruising  [] Easy bleeding   [] Hypercoagulable state   [] Anemic   Gastrointestinal:  [] Blood in stool   []   Vomiting blood  [x] Gastroesophageal reflux/heartburn   [] Abdominal pain Genitourinary:  [x] Chronic kidney disease   [] Difficult urination  [] Frequent urination  [] Burning with urination   [] Hematuria Skin:  [] Rashes   [] Ulcers   [] Wounds Psychological:  [] History of anxiety   []  History of major depression.  Physical Examination  BP (!) 167/64   Pulse 76   Ht 5\' 5"  (1.651 m)   Wt 146 lb (66.2 kg)   BMI 24.30 kg/m  Gen:  WD/WN, NAD Head: [place]/AT, No temporalis wasting. Ear/Nose/Throat: Hearing diminished, nares w/o erythema or drainage Eyes: Conjunctiva clear.  Vision diminished Neck: Supple.  Trachea midline Pulmonary:  Good air movement, no use of accessory muscles.  Cardiac: RRR, no JVD Vascular: Left arm AV fistula without bruit or thrill.  Incision is well-healed at the antecubital fossa. Vessel Right Left  Radial Palpable Palpable           Musculoskeletal: M/S 5/5 throughout.  No deformity or atrophy.  Walks with assistance.  Mild lower extremity edema. Neurologic: Sensation grossly intact in extremities.  Symmetrical.  Speech is fluent.  Psychiatric: Judgment intact, Mood & affect appropriate for pt's clinical situation. Dermatologic: No rashes or ulcers noted.  No cellulitis or open wounds.      Labs Recent Results (from the past 2160 hour(s))  CBC WITH DIFFERENTIAL     Status: Abnormal   Collection Time: 11/30/20  1:09 PM  Result Value Ref Range   WBC 3.1 (L) 4.0 - 10.5 K/uL   RBC 3.38 (L) 3.87 - 5.11 MIL/uL   Hemoglobin 10.1 (L) 12.0 - 15.0 g/dL   HCT 30.4 (L) 36.0 - 46.0 %   MCV 89.9 80.0 - 100.0 fL   MCH 29.9 26.0 - 34.0 pg   MCHC 33.2 30.0 - 36.0 g/dL   RDW 12.5 11.5 - 15.5 %    Platelets 135 (L) 150 - 400 K/uL   nRBC 0.0 0.0 - 0.2 %   Neutrophils Relative % 57 %   Neutro Abs 1.8 1.7 - 7.7 K/uL   Lymphocytes Relative 25 %   Lymphs Abs 0.8 0.7 - 4.0 K/uL   Monocytes Relative 14 %   Monocytes Absolute 0.4 0.1 - 1.0 K/uL   Eosinophils Relative 3 %   Eosinophils Absolute 0.1 0.0 - 0.5 K/uL   Basophils Relative 1 %   Basophils Absolute 0.0 0.0 - 0.1 K/uL   Immature Granulocytes 0 %   Abs Immature Granulocytes 0.01 0.00 - 0.07 K/uL    Comment: Performed at [facility], [address] [place], [place] [address]  Basic metabolic panel     Status: Abnormal   Collection Time: 11/30/20  1:09 PM  Result Value Ref Range   Sodium 138 135 - 145 mmol/L   Potassium 3.4 (L) 3.5 - 5.1 mmol/L   Chloride 95 (L) 98 - 111 mmol/L   CO2 32 22 - 32 mmol/L   Glucose, Bld 271 (H) 70 - 99 mg/dL    Comment: Glucose reference range applies only to samples taken after fasting for at least 8 hours.   BUN 13 8 - 23 mg/dL   Creatinine, Ser 2.93 (H) 0.44 - 1.00 mg/dL   Calcium 8.6 (L) 8.9 - 10.3 mg/dL   GFR, Estimated 15 (L) >60 mL/min    Comment: (NOTE) Calculated using the CKD-EPI Creatinine Equation (2021)    Anion gap 11 5 - 15    Comment: Performed at [facility], [address], [place], [place] [address]  Protime-INR     Status: None   Collection Time: 11/30/20  1:09 PM  Result Value Ref Range   Prothrombin Time 14.8 11.4 - 15.2 seconds   INR 1.2 0.8 - 1.2    Comment: (NOTE) INR goal varies based on device and disease states. Performed at [facility], [address] [place], [place] [address]   APTT     Status: None   Collection Time: 11/30/20  1:09 PM  Result Value Ref Range   aPTT 33 24 - 36 seconds    Comment: Performed at [facility], [address] [place], [place] [address]  Type and screen     Status: None   Collection Time: 11/30/20  1:09 PM  Result Value Ref Range   ABO/RH(D) A POS    Antibody Screen NEG     Sample Expiration 12/14/2020,2359    Extend sample reason      NO TRANSFUSIONS OR PREGNANCY IN THE PAST 3 MONTHS Performed at [facility], [address] [place], [place] [address]   ABO/Rh     Status: None   Collection Time: 12/02/20 10:23 AM  Result Value Ref Range   ABO/RH(D)      A POS Performed at [facility], [address] [place],  [address]   I-STAT, [place] 8     Status: Abnormal   Collection Time: 12/02/20 10:27 AM  Result Value Ref Range   Sodium 143 135 - 145 mmol/L   Potassium 3.3 (L) 3.5 - 5.1 mmol/L   Chloride 98 98 - 111 mmol/L   BUN 5 (L) 8 - 23 mg/dL   Creatinine, Ser 2.60 (H) 0.44 - 1.00 mg/dL   Glucose, Bld 157 (H) 70 - 99 mg/dL    Comment: Glucose reference range applies only to samples taken after fasting for at least 8 hours.   Calcium, Ion 1.05 (L) 1.15 - 1.40 mmol/L   TCO2 31 22 - 32 mmol/L   Hemoglobin 11.2 (L) 12.0 - 15.0 g/dL   HCT 33.0 (L) 36.0 - 46.0 %  Glucose, capillary     Status: Abnormal   Collection Time: 12/02/20  2:58 PM  Result Value Ref Range   Glucose-Capillary 131 (H) 70 - 99 mg/dL    Comment: Glucose reference range applies only to samples taken after fasting for at least 8 hours.  CBC with Differential     Status: Abnormal   Collection Time: 12/05/20  9:48 AM  Result Value Ref Range   WBC 3.5 (L) 4.0 - 10.5 K/uL   RBC 3.24 (L) 3.87 - 5.11 MIL/uL   Hemoglobin 9.9 (L) 12.0 - 15.0 g/dL   HCT 28.6 (L) 36.0 - 46.0 %   MCV 88.3 80.0 - 100.0 fL   MCH 30.6 26.0 - 34.0 pg   MCHC 34.6 30.0 - 36.0 g/dL   RDW 12.1 11.5 - 15.5 %   Platelets 145 (L) 150 - 400 K/uL   nRBC 0.0 0.0 - 0.2 %   Neutrophils Relative % 71 %   Neutro Abs 2.5 1.7 - 7.7 K/uL   Lymphocytes Relative 16 %   Lymphs Abs 0.6 (L) 0.7 - 4.0 K/uL   Monocytes Relative 10 %   Monocytes Absolute 0.4 0.1 - 1.0 K/uL   Eosinophils Relative 2 %   Eosinophils Absolute 0.1 0.0 - 0.5 K/uL   Basophils Relative 1 %   Basophils Absolute 0.0 0.0 - 0.1 K/uL    Immature Granulocytes 0 %  Abs Immature Granulocytes 0.01 0.00 - 0.07 K/uL    Comment: Performed at [facility], [address] [place], [place] [address]  Basic metabolic panel     Status: Abnormal   Collection Time: 12/05/20  9:48 AM  Result Value Ref Range   Sodium 138 135 - 145 mmol/L   Potassium 3.1 (L) 3.5 - 5.1 mmol/L   Chloride 99 98 - 111 mmol/L   CO2 28 22 - 32 mmol/L   Glucose, Bld 168 (H) 70 - 99 mg/dL    Comment: Glucose reference range applies only to samples taken after fasting for at least 8 hours.   BUN 14 8 - 23 mg/dL   Creatinine, Ser 3.17 (H) 0.44 - 1.00 mg/dL   Calcium 8.6 (L) 8.9 - 10.3 mg/dL   GFR, Estimated 14 (L) >60 mL/min    Comment: (NOTE) Calculated using the CKD-EPI Creatinine Equation (2021)    Anion gap 11 5 - 15    Comment: Performed at [facility], [address] [place], [place] [address]  Protime-INR     Status: None   Collection Time: 12/05/20  9:48 AM  Result Value Ref Range   Prothrombin Time 14.4 11.4 - 15.2 seconds   INR 1.1 0.8 - 1.2    Comment: (NOTE) INR goal varies based on device and disease states. Performed at [facility], [address] [place], [place] [address]     Radiology VAS [place] (AVF,AVG)  Result Date: 01/18/2021 DIALYSIS ACCESS Patient Name:  CHANELL NADEAU  Date of Exam:   01/18/2021 Medical Rec #: [id number]      Accession #:    [id number] Date of Birth: 1933/11/11       Patient Gender: F Patient Age:   52Y Exam [facility] Procedure:      VAS US DUPLEX DIALYSIS ACCESS (AVF, AVG) Referring Phys: [id number] Kris Hartmann --------------------------------------------------------------------------------  Reason for Exam: Non-maturation of AVF. Access Site: Left Upper Extremity. Access Type: Brachial-cephalic AVF. History: 12/02/2020 creation of left brachiocephalic AVF. Performing Technologist: Concha Norway RVT  Examination Guidelines: A complete  evaluation includes B-mode imaging, spectral Doppler, color Doppler, and power Doppler as needed of all accessible portions of each vessel. Unilateral testing is considered an integral part of a complete examination. Limited examinations for reoccurring indications may be performed as noted.  Findings: +--------------------+----------+-----------------+--------+ AVF                 PSV (cm/s)Flow Vol (mL/min)Comments +--------------------+----------+-----------------+--------+ Native artery inflow    84           150                +--------------------+----------+-----------------+--------+ AVF Anastomosis        742                      .13cm   +--------------------+----------+-----------------+--------+  +---------------+----------+-------------+----------+-------------------+ OUTFLOW VEIN   PSV (cm/s)Diameter (cm)Depth (cm)     Describe       +---------------+----------+-------------+----------+-------------------+ Subclavian vein    13                                               +---------------+----------+-------------+----------+-------------------+ Confluence  not visualized    +---------------+----------+-------------+----------+-------------------+ Prox UA            36        0.11               residual lumen <29mm +---------------+----------+-------------+----------+-------------------+ Mid UA             46        0.18               residual lumen <62mm +---------------+----------+-------------+----------+-------------------+ Dist UA            96        0.18               residual lumen <62mm +---------------+----------+-------------+----------+-------------------+  +---------------+------------+----------+---------+--------+------------------+                  Diameter  Depth (cm)Branching  PSV      Flow Volume                        (cm)                        (cm/s)      (ml/min)       +---------------+------------+----------+---------+--------+------------------+ Left Rad Art                                     64                      Dis                                                                      +---------------+------------+----------+---------+--------+------------------+ Antegrade                                                                +---------------+------------+----------+---------+--------+------------------+  Summary: Patent New left brachiocephalic AVF with severe stenosis of the anastomosis site and small diameter cephalic vein throughout.  *See table(s) above for measurements and observations.  Diagnosing physician: Leotis Pain MD Electronically signed by Leotis Pain MD on 01/18/2021 at 1:31:52 PM.   --------------------------------------------------------------------------------   Final     Assessment/Plan  HLD (hyperlipidemia) lipid control important in reducing the progression of atherosclerotic disease. Continue statin therapy   DM type 2 (diabetes mellitus, type 2) ([place]) An underlying cause of renal failure and blood glucose control important in reducing the progression of atherosclerotic disease. Also, involved in wound healing. On appropriate medications.   Essential hypertension An underlying cause of her renal failure and blood pressure control important in reducing the progression of atherosclerotic disease. On appropriate oral medications.   End stage renal disease on dialysis [facility]) We had another discussion today about her options for dialysis access.  We again discussed that she could have a left upper arm AV graft, or right upper arm AV graft, and that either option would be fine.  The left arm is no longer hurting her swelling, she would like to go back into the left arm at this point for her AV graft which is fine by me.  This will be scheduled in the near future at her convenience.  Her PermCath will have to stay  until we know her left arm AV graft will be functional.    Leotis Pain, MD  02/01/2021 11:24 AM    This note was created with Dragon medical transcription system.  Any errors from dictation are purely unintentional

## 2021-02-01 NOTE — Assessment & Plan Note (Signed)
An underlying cause of her renal failure and blood pressure control important in reducing the progression of atherosclerotic disease. On appropriate oral medications.  

## 2021-02-01 NOTE — H&P (View-Only) (Signed)
MRN : [id number]  Brianna Dodson is a 87 y.o. (Jun 13, 1934) female who presents with chief complaint of  Chief Complaint  Patient presents with   Follow-up    Discuss surgery  .  History of Present Illness: Patient returns today in follow up of her dialysis access.  She is here to discuss her access options.  Last month, she was seen and wanted to have a right upper arm AV graft placed because of the pain and swelling in her left arm after the failed fistula in the brachiocephalic location.  Her pain and swelling have resolved in the left arm.  She desires now to go back into the left arm which is her nondominant arm.  Still using her right jugular PermCath.  No new complaints today  Current Outpatient Medications  Medication Sig Dispense Refill   amLODipine (NORVASC) 10 MG tablet Take 0.5 tablets (5 mg total) by mouth daily. 45 tablet 1   atorvastatin (LIPITOR) 80 MG tablet Take 1 tablet (80 mg total) by mouth daily. 90 tablet 3   atorvastatin (LIPITOR) 80 MG tablet Take by mouth.     BESIVANCE 0.6 % SUSP Apply to eye.     carvedilol (COREG) 6.25 MG tablet TAKE 1 TABLET (6.25 MG TOTAL) BY MOUTH 2 (TWO) TIMES DAILY WITH A MEAL. 180 tablet 1   ELIQUIS 2.5 MG TABS tablet Take 1 tablet (2.5 mg total) by mouth 2 (two) times daily. 56 tablet 0   ezetimibe (ZETIA) 10 MG tablet TAKE 1 TABLET BY MOUTH EVERY DAY 90 tablet 3   Ferrous Sulfate (IRON) 28 MG TABS Take 28 mg by mouth daily at 12 noon.     glucose blood (ACCU-CHEK GUIDE) test strip USE TO CHECK BLOOD SUGARS TWICE DAILY. E11.9 300 each 3   hydrALAZINE (APRESOLINE) 50 MG tablet Take 50 mg by mouth 2 (two) times daily.     iron sucrose in sodium chloride 0.9 % 100 mL Iron Sucrose (Venofer)     Lancets (ONETOUCH ULTRASOFT) lancets Use as instructed. Use patient insurance preferred brand. 300 each 3   linagliptin (TRADJENTA) 5 MG TABS tablet Take 1 tablet (5 mg total) by mouth daily. 90 tablet 3   losartan (COZAAR) 50 MG tablet Take 50 mg by  mouth daily.     memantine (NAMENDA) 5 MG tablet Take 5 mg by mouth every evening.     Methoxy PEG-Epoetin Beta (MIRCERA IJ) Mircera     mirtazapine (REMERON) 7.5 MG tablet Take 7.5 mg by mouth at bedtime.     Multiple Vitamins-Minerals (MULTI COMPLETE/IRON) TABS Take by mouth.     thiamine 100 MG tablet Take by mouth.     Vitamin D, Ergocalciferol, (DRISDOL) 50000 units CAPS capsule Take 50,000 Units by mouth every 30 (thirty) days.  1   No current facility-administered medications for this visit.    Past Medical History:  Diagnosis Date   A-fib ([place])    Alzheimer disease ([place])    Anemia    Carotid stenosis    Cerebral infarction ([place]) 04/12/2019   Multiple new foci of acute infarction affect the RIGHT hemisphere affecting the frontal, posterior frontal, posterior temporal, anterior parietal cortex and regional white matter.   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/2019   RIGHT MCA distal M1   Chronic anticoagulation    Apixaban   CKD (chronic kidney disease), stage 4(HCC)    Embolic stroke involving cerebral artery ([place]) 03/15/2019   RIGHT MCA/PCA  territory   Encephalomalacia    RIGHT posterior MCA territory   HCV (hepatitis C virus)    History of blood transfusion    Hyperlipidemia    Hypertension    OSA (obstructive sleep apnea)    Status post placement of implantable loop recorder 07/30/2019   T2DM (type 2 diabetes mellitus) ([place])     Past Surgical History:  Procedure Laterality Date   ABDOMINAL HYSTERECTOMY  1985   AV FISTULA PLACEMENT Left 12/02/2020   Procedure: INSERTION OF ARTERIOVENOUS (AV)  FISTULA  ARM ( BRACHIO- CEPHALIC);  Surgeon: Algernon Huxley, MD;  Location: [facility];  Service: Vascular;  Laterality: Left;   DIALYSIS/PERMA CATHETER INSERTION N/A 10/06/2020   Procedure: DIALYSIS/PERMA CATHETER INSERTION;  Surgeon: Algernon Huxley, MD;  Location: [facility];  Service: Cardiovascular;  Laterality: N/A;   LOOP  RECORDER INSERTION N/A 07/30/2019   Procedure: LOOP RECORDER INSERTION;  Surgeon: Isaias Cowman, MD;  Location: [facility];  Service: Cardiovascular;  Laterality: N/A;   TEE WITHOUT CARDIOVERSION N/A 04/14/2019   Procedure: TRANSESOPHAGEAL ECHOCARDIOGRAM (TEE);  Surgeon: Teodoro Spray, MD;  Location: [facility];  Service: Cardiovascular;  Laterality: N/A;     Social History   Tobacco Use   Smoking status: Never   Smokeless tobacco: Never  Vaping Use   Vaping Use: Never used  Substance Use Topics   Alcohol use: No   Drug use: No       Family History  Problem Relation Age of Onset   Stroke Mother    Diabetes Mother    Heart disease Father    Kidney disease Sister    Diabetes Sister    Kidney disease Brother    Heart disease Sister    Diabetes Sister    Diabetes Sister    Diabetes Sister    Kidney disease Sister    Heart disease Sister    Kidney disease Brother        kidney transplant   Early death Brother 65       Truck Accident - died   Heart disease Brother     Allergies  Allergen Reactions   Nsaids     CKD stage III - Avoid all nephrotoxic drugs     REVIEW OF SYSTEMS (Negative unless checked)  Constitutional: [] Weight loss  [] Fever  [] Chills Cardiac: [] Chest pain   [] Chest pressure   [] Palpitations   [] Shortness of breath when laying flat   [] Shortness of breath at rest   [] Shortness of breath with exertion. Vascular:  [] Pain in legs with walking   [] Pain in legs at rest   [] Pain in legs when laying flat   [] Claudication   [] Pain in feet when walking  [] Pain in feet at rest  [] Pain in feet when laying flat   [] History of DVT   [] Phlebitis   [] Swelling in legs   [] Varicose veins   [] Non-healing ulcers Pulmonary:   [] Uses home oxygen   [] Productive cough   [] Hemoptysis   [] Wheeze  [] COPD   [] Asthma Neurologic:  [] Dizziness  [] Blackouts   [] Seizures   [] History of stroke   [] History of TIA  [] Aphasia   [] Temporary blindness   [] Dysphagia    [] Weakness or numbness in arms   [] Weakness or numbness in legs Musculoskeletal:  [x] Arthritis   [] Joint swelling   [x] Joint pain   [] Low back pain Hematologic:  [] Easy bruising  [] Easy bleeding   [] Hypercoagulable state   [] Anemic   Gastrointestinal:  [] Blood in stool   []   Vomiting blood  [x] Gastroesophageal reflux/heartburn   [] Abdominal pain Genitourinary:  [x] Chronic kidney disease   [] Difficult urination  [] Frequent urination  [] Burning with urination   [] Hematuria Skin:  [] Rashes   [] Ulcers   [] Wounds Psychological:  [] History of anxiety   []  History of major depression.  Physical Examination  BP (!) 167/64   Pulse 76   Ht 5\' 5"  (1.651 m)   Wt 146 lb (66.2 kg)   BMI 24.30 kg/m  Gen:  WD/WN, NAD Head: [place]/AT, No temporalis wasting. Ear/Nose/Throat: Hearing diminished, nares w/o erythema or drainage Eyes: Conjunctiva clear.  Vision diminished Neck: Supple.  Trachea midline Pulmonary:  Good air movement, no use of accessory muscles.  Cardiac: RRR, no JVD Vascular: Left arm AV fistula without bruit or thrill.  Incision is well-healed at the antecubital fossa. Vessel Right Left  Radial Palpable Palpable           Musculoskeletal: M/S 5/5 throughout.  No deformity or atrophy.  Walks with assistance.  Mild lower extremity edema. Neurologic: Sensation grossly intact in extremities.  Symmetrical.  Speech is fluent.  Psychiatric: Judgment intact, Mood & affect appropriate for pt's clinical situation. Dermatologic: No rashes or ulcers noted.  No cellulitis or open wounds.      Labs Recent Results (from the past 2160 hour(s))  CBC WITH DIFFERENTIAL     Status: Abnormal   Collection Time: 11/30/20  1:09 PM  Result Value Ref Range   WBC 3.1 (L) 4.0 - 10.5 K/uL   RBC 3.38 (L) 3.87 - 5.11 MIL/uL   Hemoglobin 10.1 (L) 12.0 - 15.0 g/dL   HCT 30.4 (L) 36.0 - 46.0 %   MCV 89.9 80.0 - 100.0 fL   MCH 29.9 26.0 - 34.0 pg   MCHC 33.2 30.0 - 36.0 g/dL   RDW 12.5 11.5 - 15.5 %    Platelets 135 (L) 150 - 400 K/uL   nRBC 0.0 0.0 - 0.2 %   Neutrophils Relative % 57 %   Neutro Abs 1.8 1.7 - 7.7 K/uL   Lymphocytes Relative 25 %   Lymphs Abs 0.8 0.7 - 4.0 K/uL   Monocytes Relative 14 %   Monocytes Absolute 0.4 0.1 - 1.0 K/uL   Eosinophils Relative 3 %   Eosinophils Absolute 0.1 0.0 - 0.5 K/uL   Basophils Relative 1 %   Basophils Absolute 0.0 0.0 - 0.1 K/uL   Immature Granulocytes 0 %   Abs Immature Granulocytes 0.01 0.00 - 0.07 K/uL    Comment: Performed at [facility], [address] [place], [place] [address]  Basic metabolic panel     Status: Abnormal   Collection Time: 11/30/20  1:09 PM  Result Value Ref Range   Sodium 138 135 - 145 mmol/L   Potassium 3.4 (L) 3.5 - 5.1 mmol/L   Chloride 95 (L) 98 - 111 mmol/L   CO2 32 22 - 32 mmol/L   Glucose, Bld 271 (H) 70 - 99 mg/dL    Comment: Glucose reference range applies only to samples taken after fasting for at least 8 hours.   BUN 13 8 - 23 mg/dL   Creatinine, Ser 2.93 (H) 0.44 - 1.00 mg/dL   Calcium 8.6 (L) 8.9 - 10.3 mg/dL   GFR, Estimated 15 (L) >60 mL/min    Comment: (NOTE) Calculated using the CKD-EPI Creatinine Equation (2021)    Anion gap 11 5 - 15    Comment: Performed at [facility], [address] [place],  [address]  Protime-INR     Status: None   Collection Time: 11/30/20  1:09 PM  Result Value Ref Range   Prothrombin Time 14.8 11.4 - 15.2 seconds   INR 1.2 0.8 - 1.2    Comment: (NOTE) INR goal varies based on device and disease states. Performed at [facility], [address] [place], [place] [address]   APTT     Status: None   Collection Time: 11/30/20  1:09 PM  Result Value Ref Range   aPTT 33 24 - 36 seconds    Comment: Performed at [facility], [address] [place], [place] [address]  Type and screen     Status: None   Collection Time: 11/30/20  1:09 PM  Result Value Ref Range   ABO/RH(D) A POS    Antibody Screen NEG     Sample Expiration 12/14/2020,2359    Extend sample reason      NO TRANSFUSIONS OR PREGNANCY IN THE PAST 3 MONTHS Performed at [facility], [address] [place], [place] [address]   ABO/Rh     Status: None   Collection Time: 12/02/20 10:23 AM  Result Value Ref Range   ABO/RH(D)      A POS Performed at [facility], [address] [place], [place] [address]   I-STAT, [place] 8     Status: Abnormal   Collection Time: 12/02/20 10:27 AM  Result Value Ref Range   Sodium 143 135 - 145 mmol/L   Potassium 3.3 (L) 3.5 - 5.1 mmol/L   Chloride 98 98 - 111 mmol/L   BUN 5 (L) 8 - 23 mg/dL   Creatinine, Ser 2.60 (H) 0.44 - 1.00 mg/dL   Glucose, Bld 157 (H) 70 - 99 mg/dL    Comment: Glucose reference range applies only to samples taken after fasting for at least 8 hours.   Calcium, Ion 1.05 (L) 1.15 - 1.40 mmol/L   TCO2 31 22 - 32 mmol/L   Hemoglobin 11.2 (L) 12.0 - 15.0 g/dL   HCT 33.0 (L) 36.0 - 46.0 %  Glucose, capillary     Status: Abnormal   Collection Time: 12/02/20  2:58 PM  Result Value Ref Range   Glucose-Capillary 131 (H) 70 - 99 mg/dL    Comment: Glucose reference range applies only to samples taken after fasting for at least 8 hours.  CBC with Differential     Status: Abnormal   Collection Time: 12/05/20  9:48 AM  Result Value Ref Range   WBC 3.5 (L) 4.0 - 10.5 K/uL   RBC 3.24 (L) 3.87 - 5.11 MIL/uL   Hemoglobin 9.9 (L) 12.0 - 15.0 g/dL   HCT 28.6 (L) 36.0 - 46.0 %   MCV 88.3 80.0 - 100.0 fL   MCH 30.6 26.0 - 34.0 pg   MCHC 34.6 30.0 - 36.0 g/dL   RDW 12.1 11.5 - 15.5 %   Platelets 145 (L) 150 - 400 K/uL   nRBC 0.0 0.0 - 0.2 %   Neutrophils Relative % 71 %   Neutro Abs 2.5 1.7 - 7.7 K/uL   Lymphocytes Relative 16 %   Lymphs Abs 0.6 (L) 0.7 - 4.0 K/uL   Monocytes Relative 10 %   Monocytes Absolute 0.4 0.1 - 1.0 K/uL   Eosinophils Relative 2 %   Eosinophils Absolute 0.1 0.0 - 0.5 K/uL   Basophils Relative 1 %   Basophils Absolute 0.0 0.0 - 0.1 K/uL    Immature Granulocytes 0 %  Abs Immature Granulocytes 0.01 0.00 - 0.07 K/uL    Comment: Performed at [facility], [address] [place], [place] [address]  Basic metabolic panel     Status: Abnormal   Collection Time: 12/05/20  9:48 AM  Result Value Ref Range   Sodium 138 135 - 145 mmol/L   Potassium 3.1 (L) 3.5 - 5.1 mmol/L   Chloride 99 98 - 111 mmol/L   CO2 28 22 - 32 mmol/L   Glucose, Bld 168 (H) 70 - 99 mg/dL    Comment: Glucose reference range applies only to samples taken after fasting for at least 8 hours.   BUN 14 8 - 23 mg/dL   Creatinine, Ser 3.17 (H) 0.44 - 1.00 mg/dL   Calcium 8.6 (L) 8.9 - 10.3 mg/dL   GFR, Estimated 14 (L) >60 mL/min    Comment: (NOTE) Calculated using the CKD-EPI Creatinine Equation (2021)    Anion gap 11 5 - 15    Comment: Performed at [facility], [address] [place], [place] [address]  Protime-INR     Status: None   Collection Time: 12/05/20  9:48 AM  Result Value Ref Range   Prothrombin Time 14.4 11.4 - 15.2 seconds   INR 1.1 0.8 - 1.2    Comment: (NOTE) INR goal varies based on device and disease states. Performed at [facility], [address] [place],  [address]     Radiology VAS [place] (AVF,AVG)  Result Date: 01/18/2021 DIALYSIS ACCESS Patient Name:  LENYA STERNE  Date of Exam:   01/18/2021 Medical Rec #: [id number]      Accession #:    [id number] Date of Birth: 17-Oct-1933       Patient Gender: F Patient Age:   71Y Exam Location:  [facility] Procedure:      VAS US DUPLEX DIALYSIS ACCESS (AVF, AVG) Referring Phys: [id number] Kris Hartmann --------------------------------------------------------------------------------  Reason for Exam: Non-maturation of AVF. Access Site: Left Upper Extremity. Access Type: Brachial-cephalic AVF. History: 12/02/2020 creation of left brachiocephalic AVF. Performing Technologist: Concha Norway RVT  Examination Guidelines: A complete  evaluation includes B-mode imaging, spectral Doppler, color Doppler, and power Doppler as needed of all accessible portions of each vessel. Unilateral testing is considered an integral part of a complete examination. Limited examinations for reoccurring indications may be performed as noted.  Findings: +--------------------+----------+-----------------+--------+ AVF                 PSV (cm/s)Flow Vol (mL/min)Comments +--------------------+----------+-----------------+--------+ Native artery inflow    84           150                +--------------------+----------+-----------------+--------+ AVF Anastomosis        742                      .13cm   +--------------------+----------+-----------------+--------+  +---------------+----------+-------------+----------+-------------------+ OUTFLOW VEIN   PSV (cm/s)Diameter (cm)Depth (cm)     Describe       +---------------+----------+-------------+----------+-------------------+ Subclavian vein    13                                               +---------------+----------+-------------+----------+-------------------+ Confluence  not visualized    +---------------+----------+-------------+----------+-------------------+ Prox UA            36        0.11               residual lumen <66mm +---------------+----------+-------------+----------+-------------------+ Mid UA             46        0.18               residual lumen <18mm +---------------+----------+-------------+----------+-------------------+ Dist UA            96        0.18               residual lumen <34mm +---------------+----------+-------------+----------+-------------------+  +---------------+------------+----------+---------+--------+------------------+                  Diameter  Depth (cm)Branching  PSV      Flow Volume                        (cm)                        (cm/s)      (ml/min)       +---------------+------------+----------+---------+--------+------------------+ Left Rad Art                                     64                      Dis                                                                      +---------------+------------+----------+---------+--------+------------------+ Antegrade                                                                +---------------+------------+----------+---------+--------+------------------+  Summary: Patent New left brachiocephalic AVF with severe stenosis of the anastomosis site and small diameter cephalic vein throughout.  *See table(s) above for measurements and observations.  Diagnosing physician: Leotis Pain MD Electronically signed by Leotis Pain MD on 01/18/2021 at 1:31:52 PM.   --------------------------------------------------------------------------------   Final     Assessment/Plan  HLD (hyperlipidemia) lipid control important in reducing the progression of atherosclerotic disease. Continue statin therapy   DM type 2 (diabetes mellitus, type 2) ([place]) An underlying cause of renal failure and blood glucose control important in reducing the progression of atherosclerotic disease. Also, involved in wound healing. On appropriate medications.   Essential hypertension An underlying cause of her renal failure and blood pressure control important in reducing the progression of atherosclerotic disease. On appropriate oral medications.   End stage renal disease on dialysis [facility]) We had another discussion today about her options for dialysis access.  We again discussed that she could have a left upper arm AV graft, or right upper arm AV graft, and that either option would be fine.  The left arm is no longer hurting her swelling, she would like to go back into the left arm at this point for her AV graft which is fine by me.  This will be scheduled in the near future at her convenience.  Her PermCath will have to stay  until we know her left arm AV graft will be functional.    Leotis Pain, MD  02/01/2021 11:24 AM    This note was created with Dragon medical transcription system.  Any errors from dictation are purely unintentional

## 2021-02-01 NOTE — Assessment & Plan Note (Signed)
We had another discussion today about her options for dialysis access.  We again discussed that she could have a left upper arm AV graft, or right upper arm AV graft, and that either option would be fine.  The left arm is no longer hurting her swelling, she would like to go back into the left arm at this point for her AV graft which is fine by me.  This will be scheduled in the near future at her convenience.  Her PermCath will have to stay until we know her left arm AV graft will be functional.

## 2021-02-01 NOTE — Assessment & Plan Note (Signed)
lipid control important in reducing the progression of atherosclerotic disease. Continue statin therapy  

## 2021-02-01 NOTE — Assessment & Plan Note (Signed)
An underlying cause of renal failure and blood glucose control important in reducing the progression of atherosclerotic disease. Also, involved in wound healing. On appropriate medications.  

## 2021-02-02 DIAGNOSIS — D509 Iron deficiency anemia, unspecified: Secondary | ICD-10-CM | POA: Diagnosis not present

## 2021-02-02 DIAGNOSIS — N186 End stage renal disease: Secondary | ICD-10-CM | POA: Diagnosis not present

## 2021-02-02 DIAGNOSIS — Z992 Dependence on renal dialysis: Secondary | ICD-10-CM | POA: Diagnosis not present

## 2021-02-02 DIAGNOSIS — N2581 Secondary hyperparathyroidism of renal origin: Secondary | ICD-10-CM | POA: Diagnosis not present

## 2021-02-02 DIAGNOSIS — D631 Anemia in chronic kidney disease: Secondary | ICD-10-CM | POA: Diagnosis not present

## 2021-02-03 ENCOUNTER — Telehealth (INDEPENDENT_AMBULATORY_CARE_PROVIDER_SITE_OTHER): Payer: Self-pay

## 2021-02-03 DIAGNOSIS — N186 End stage renal disease: Secondary | ICD-10-CM | POA: Diagnosis not present

## 2021-02-03 DIAGNOSIS — L03114 Cellulitis of left upper limb: Secondary | ICD-10-CM | POA: Diagnosis not present

## 2021-02-03 DIAGNOSIS — D631 Anemia in chronic kidney disease: Secondary | ICD-10-CM | POA: Diagnosis not present

## 2021-02-03 DIAGNOSIS — E1122 Type 2 diabetes mellitus with diabetic chronic kidney disease: Secondary | ICD-10-CM | POA: Diagnosis not present

## 2021-02-03 DIAGNOSIS — Z48812 Encounter for surgical aftercare following surgery on the circulatory system: Secondary | ICD-10-CM | POA: Diagnosis not present

## 2021-02-03 DIAGNOSIS — I120 Hypertensive chronic kidney disease with stage 5 chronic kidney disease or end stage renal disease: Secondary | ICD-10-CM | POA: Diagnosis not present

## 2021-02-03 NOTE — Telephone Encounter (Signed)
Jobe Gibbon from [facility] called and left a Vm on the nurses line wanting a verbal order for PR for 1 week 6 for ambulation and balance. Please advice.

## 2021-02-04 DIAGNOSIS — Z992 Dependence on renal dialysis: Secondary | ICD-10-CM | POA: Diagnosis not present

## 2021-02-04 DIAGNOSIS — N186 End stage renal disease: Secondary | ICD-10-CM | POA: Diagnosis not present

## 2021-02-04 DIAGNOSIS — D631 Anemia in chronic kidney disease: Secondary | ICD-10-CM | POA: Diagnosis not present

## 2021-02-04 DIAGNOSIS — N2581 Secondary hyperparathyroidism of renal origin: Secondary | ICD-10-CM | POA: Diagnosis not present

## 2021-02-04 DIAGNOSIS — D509 Iron deficiency anemia, unspecified: Secondary | ICD-10-CM | POA: Diagnosis not present

## 2021-02-04 NOTE — Telephone Encounter (Signed)
I called Stacy the PT back and made her aware of the NP's instructions.

## 2021-02-04 NOTE — Telephone Encounter (Signed)
That is fine 

## 2021-02-05 DIAGNOSIS — I4891 Unspecified atrial fibrillation: Secondary | ICD-10-CM | POA: Diagnosis not present

## 2021-02-05 DIAGNOSIS — E1122 Type 2 diabetes mellitus with diabetic chronic kidney disease: Secondary | ICD-10-CM | POA: Diagnosis not present

## 2021-02-05 DIAGNOSIS — I6529 Occlusion and stenosis of unspecified carotid artery: Secondary | ICD-10-CM | POA: Diagnosis not present

## 2021-02-05 DIAGNOSIS — G479 Sleep disorder, unspecified: Secondary | ICD-10-CM | POA: Diagnosis not present

## 2021-02-05 DIAGNOSIS — E440 Moderate protein-calorie malnutrition: Secondary | ICD-10-CM | POA: Diagnosis not present

## 2021-02-05 DIAGNOSIS — I69318 Other symptoms and signs involving cognitive functions following cerebral infarction: Secondary | ICD-10-CM | POA: Diagnosis not present

## 2021-02-05 DIAGNOSIS — D72819 Decreased white blood cell count, unspecified: Secondary | ICD-10-CM | POA: Diagnosis not present

## 2021-02-05 DIAGNOSIS — F0280 Dementia in other diseases classified elsewhere without behavioral disturbance: Secondary | ICD-10-CM | POA: Diagnosis not present

## 2021-02-05 DIAGNOSIS — G309 Alzheimer's disease, unspecified: Secondary | ICD-10-CM | POA: Diagnosis not present

## 2021-02-05 DIAGNOSIS — Z992 Dependence on renal dialysis: Secondary | ICD-10-CM | POA: Diagnosis not present

## 2021-02-05 DIAGNOSIS — N186 End stage renal disease: Secondary | ICD-10-CM | POA: Diagnosis not present

## 2021-02-05 DIAGNOSIS — N2581 Secondary hyperparathyroidism of renal origin: Secondary | ICD-10-CM | POA: Diagnosis not present

## 2021-02-05 DIAGNOSIS — Z7902 Long term (current) use of antithrombotics/antiplatelets: Secondary | ICD-10-CM | POA: Diagnosis not present

## 2021-02-05 DIAGNOSIS — E559 Vitamin D deficiency, unspecified: Secondary | ICD-10-CM | POA: Diagnosis not present

## 2021-02-05 DIAGNOSIS — F0150 Vascular dementia without behavioral disturbance: Secondary | ICD-10-CM | POA: Diagnosis not present

## 2021-02-05 DIAGNOSIS — Z741 Need for assistance with personal care: Secondary | ICD-10-CM | POA: Diagnosis not present

## 2021-02-05 DIAGNOSIS — Z9181 History of falling: Secondary | ICD-10-CM | POA: Diagnosis not present

## 2021-02-05 DIAGNOSIS — D631 Anemia in chronic kidney disease: Secondary | ICD-10-CM | POA: Diagnosis not present

## 2021-02-05 DIAGNOSIS — Z7984 Long term (current) use of oral hypoglycemic drugs: Secondary | ICD-10-CM | POA: Diagnosis not present

## 2021-02-05 DIAGNOSIS — I120 Hypertensive chronic kidney disease with stage 5 chronic kidney disease or end stage renal disease: Secondary | ICD-10-CM | POA: Diagnosis not present

## 2021-02-05 DIAGNOSIS — E041 Nontoxic single thyroid nodule: Secondary | ICD-10-CM | POA: Diagnosis not present

## 2021-02-07 ENCOUNTER — Telehealth: Payer: Medicare Other

## 2021-02-07 DIAGNOSIS — Z992 Dependence on renal dialysis: Secondary | ICD-10-CM | POA: Diagnosis not present

## 2021-02-07 DIAGNOSIS — D631 Anemia in chronic kidney disease: Secondary | ICD-10-CM | POA: Diagnosis not present

## 2021-02-07 DIAGNOSIS — F0280 Dementia in other diseases classified elsewhere without behavioral disturbance: Secondary | ICD-10-CM | POA: Diagnosis not present

## 2021-02-07 DIAGNOSIS — G309 Alzheimer's disease, unspecified: Secondary | ICD-10-CM | POA: Diagnosis not present

## 2021-02-07 DIAGNOSIS — E1122 Type 2 diabetes mellitus with diabetic chronic kidney disease: Secondary | ICD-10-CM | POA: Diagnosis not present

## 2021-02-07 DIAGNOSIS — N186 End stage renal disease: Secondary | ICD-10-CM | POA: Diagnosis not present

## 2021-02-07 DIAGNOSIS — I120 Hypertensive chronic kidney disease with stage 5 chronic kidney disease or end stage renal disease: Secondary | ICD-10-CM | POA: Diagnosis not present

## 2021-02-07 DIAGNOSIS — N2581 Secondary hyperparathyroidism of renal origin: Secondary | ICD-10-CM | POA: Diagnosis not present

## 2021-02-07 DIAGNOSIS — D509 Iron deficiency anemia, unspecified: Secondary | ICD-10-CM | POA: Diagnosis not present

## 2021-02-08 ENCOUNTER — Ambulatory Visit: Payer: Medicare Other | Admitting: Pharmacist

## 2021-02-08 DIAGNOSIS — N186 End stage renal disease: Secondary | ICD-10-CM

## 2021-02-08 DIAGNOSIS — E785 Hyperlipidemia, unspecified: Secondary | ICD-10-CM

## 2021-02-08 DIAGNOSIS — I6930 Unspecified sequelae of cerebral infarction: Secondary | ICD-10-CM

## 2021-02-08 DIAGNOSIS — E1122 Type 2 diabetes mellitus with diabetic chronic kidney disease: Secondary | ICD-10-CM

## 2021-02-08 DIAGNOSIS — N185 Chronic kidney disease, stage 5: Secondary | ICD-10-CM

## 2021-02-08 DIAGNOSIS — I10 Essential (primary) hypertension: Secondary | ICD-10-CM

## 2021-02-08 NOTE — Patient Instructions (Signed)
Visit Information  PATIENT GOALS:  Goals Addressed               This Visit's Progress     Patient Stated     Medication Management (pt-stated)        Patient Goals/Self-Care Activities Over the next 90 days, patient will:  - take medications as prescribed check blood pressure daily, document, and provide at future appointments        Patient verbalizes understanding of instructions provided today and agrees to view in [place].    Plan: Telephone follow up appointment with care management team member scheduled for:  ~ 12 weeks  Catie Darnelle Maffucci, PharmD, [place], [place] Clinical Pharmacist [employer] [facility] [employer] [phone]

## 2021-02-08 NOTE — Chronic Care Management (AMB) (Signed)
Chronic Care Management Pharmacy Note  02/08/2021 Name:  Brianna Dodson MRN:  [id number] DOB:  22-Aug-1933  Subjective: Brianna Dodson is an 87 y.o. year old female who is a primary patient of Sonnenberg, Angela Adam, MD.  The CCM team was consulted for assistance with disease management and care coordination needs.    Engaged with patient by telephone for follow up visit in response to provider referral for pharmacy case management and/or care coordination services. Spoke with granddaughter with patient's permission.  Consent to Services:  The patient was given information about Chronic Care Management services, agreed to services, and gave verbal consent prior to initiation of services.  Please see initial visit note for detailed documentation.   Patient Care Team: [name], MD as PCP - General (Family Medicine) De Hollingshead, RPH-CPP (Pharmacist)  Recent office visits: 7/26 - PCP reduced amlodipine to 5 mg daily, continue carvedilol 6.25 mg daily on HD   Recent consult visits: 7/7 - cardiology Margarito Courser - continue current regimen 7/14 - endocrinology Pasty Arch - continue current regimen 7/19 - vascular f/u dialysis access - plan for AV graft in R upper extremity  7/26 - PCP reduced amlodipine to 5 mg daily, continue carvedilol 6.25 mg daily on HD days, BID rest of the week, losartan 50 mg daily 8/2 - vascular f/u - changing plan to AV grafy in L upper extremity  Hospital visits: None in previous 6 months  Objective:  Lab Results  Component Value Date   CREATININE 3.17 (H) 12/05/2020   CREATININE 2.60 (H) 12/02/2020   CREATININE 2.93 (H) 11/30/2020    Lab Results  Component Value Date   HGBA1C 6.8 (H) 09/11/2020   Last diabetic Eye exam:  Lab Results  Component Value Date/Time   HMDIABEYEEXA No Retinopathy 09/15/2014 12:00 AM    Last diabetic Foot exam: No results found for: HMDIABFOOTEX      Component Value Date/Time   CHOL 87 09/11/2020 0223   TRIG 95  09/11/2020 0223   HDL 38 (L) 09/11/2020 0223   CHOLHDL 2.3 09/11/2020 0223   VLDL 19 09/11/2020 0223   LDLCALC 30 09/11/2020 0223   LDLDIRECT 98.0 01/24/2018 0912    Hepatic Function Latest Ref Rng & Units 06/09/2020 11/26/2019 08/20/2019  Total Protein 6.0 - 8.3 g/dL 7.2 6.8 7.1  Albumin 3.5 - 5.2 g/dL 3.9 4.1 3.5  AST 0 - 37 U/L _0 ALT 0 - 35 U/L _1 Alk Phosphatase 39 - 117 U/L 48 46 46  Total Bilirubin 0.2 - 1.2 mg/dL 0.6 0.4 0.9  Bilirubin, Direct 0.0 - 0.3 mg/dL - - -    Lab Results  Component Value Date/Time   TSH 2.55 06/09/2020 11:45 AM   TSH 2.32 04/21/2020 10:04 AM    CBC Latest Ref Rng & Units 12/05/2020 12/02/2020 11/30/2020  WBC 4.0 - 10.5 K/uL 3.5(L) - 3.1(L)  Hemoglobin 12.0 - 15.0 g/dL 9.9(L) 11.2(L) 10.1(L)  Hematocrit 36.0 - 46.0 % 28.6(L) 33.0(L) 30.4(L)  Platelets 150 - 400 K/uL 145(L) - 135(L)    Lab Results  Component Value Date/Time   VD25OH 28.70 (L) 06/09/2020 11:45 AM   VD25OH 41.14 01/06/2016 09:23 AM    Clinical ASCVD: Yes  The ASCVD Risk score Mikey Bussing DC Jr., et al., 2013) failed to calculate for the following reasons:   The 2013 ASCVD risk score is only valid for ages 55 to 69   The patient has a prior MI or stroke diagnosis  CHA2DS2-VASc Score = 7  This indicates a 11.2% annual risk of stroke. The patient's score is based upon: CHF History: No HTN History: Yes Diabetes History: Yes Stroke History: Yes Vascular Disease History: No Age Score: 2 Gender Score: 1     Social History   Tobacco Use  Smoking Status Never  Smokeless Tobacco Never   BP Readings from Last 3 Encounters:  02/01/21 (!) 167/64  01/25/21 (!) 110/50  01/18/21 (!) 154/71   Pulse Readings from Last 3 Encounters:  02/01/21 76  01/25/21 70  01/18/21 66   Wt Readings from Last 3 Encounters:  02/01/21 146 lb (66.2 kg)  01/25/21 144 lb 9.6 oz (65.6 kg)  01/18/21 143 lb (64.9 kg)    Assessment: Review of patient past medical history, allergies,  medications, health status, including review of consultants reports, laboratory and other test data, was performed as part of comprehensive evaluation and provision of chronic care management services.   SDOH:  (Social Determinants of Health) assessments and interventions performed:  SDOH Interventions    Flowsheet Row Most Recent Value  SDOH Interventions   Financial Strain Interventions Intervention Not Indicated       CCM Care Plan  Allergies  Allergen Reactions   Nsaids     CKD stage III - Avoid all nephrotoxic drugs    Medications Reviewed Today     Reviewed by De Hollingshead, RPH-CPP (Pharmacist) on 02/08/21 at 23  Med List Status: <None>   Medication Order Taking? Sig Documenting Provider Last Dose Status Informant  amLODipine (NORVASC) 10 MG tablet [id number] Yes Take 0.5 tablets (5 mg total) by mouth daily. [name], MD Taking Active   atorvastatin (LIPITOR) 80 MG tablet [id number] Yes Take 1 tablet (80 mg total) by mouth daily. [name], MD Taking Active     Discontinued 02/08/21 1031 (Change in therapy)   carvedilol (COREG) 6.25 MG tablet [id number] Yes TAKE 1 TABLET (6.25 MG TOTAL) BY MOUTH 2 (TWO) TIMES DAILY WITH A MEAL. [name], MD Taking Active            Med Note ([place] Feb 08, 2021 10:27 AM) MWF takes QPM, all other days BID  ELIQUIS 2.5 MG TABS tablet [id number] Yes Take 1 tablet (2.5 mg total) by mouth 2 (two) times daily. [name], MD Taking Active   ezetimibe (ZETIA) 10 MG tablet [id number] Yes TAKE 1 TABLET BY MOUTH EVERY DAY [name], MD Taking Active   glucose blood (ACCU-CHEK GUIDE) test strip [id number] Yes USE TO CHECK BLOOD SUGARS TWICE DAILY. E11.9 [name], MD Taking Active   hydrALAZINE (APRESOLINE) 50 MG tablet [id number] Yes Take 50 mg by mouth 2 (two) times daily. [provider] Taking Active Multiple Informants  Lancets [facility] ULTRASOFT) lancets [id number]  Yes Use as instructed. Use patient insurance preferred brand. [name], MD Taking Active   linagliptin (TRADJENTA) 5 MG TABS tablet [id number] Yes Take 1 tablet (5 mg total) by mouth daily. [name], MD Taking Active   mirtazapine (REMERON) 7.5 MG tablet [id number] Yes Take 7.5 mg by mouth at bedtime. [provider] Taking Active   Vitamin D, Ergocalciferol, (DRISDOL) 50000 units CAPS capsule [id number] Yes Take 50,000 Units by mouth every 30 (thirty) days. [provider] Taking Active Multiple Informants           Med Note De Hollingshead   Tue Feb 08, 2021 10:30 AM)  Patient Active Problem List   Diagnosis Date Noted   Other disorders of phosphorus metabolism 01/12/2021   Cerebral infarction, unspecified ([place]) 10/12/2020   Coagulation defect, unspecified ([place]) 10/12/2020   Diarrhea, unspecified 10/12/2020   Encounter for immunization 10/12/2020   End stage renal disease on dialysis ([place]) 10/12/2020   Fever, unspecified 10/12/2020   Headache, unspecified 10/12/2020   Iron deficiency anemia, unspecified 10/12/2020   Localized edema 10/12/2020   Nontoxic single thyroid nodule 10/12/2020   Occlusion and stenosis of unspecified carotid artery 10/12/2020   Other fluid overload 10/12/2020   Pain, unspecified 10/12/2020   Shortness of breath 10/12/2020   Vascular dementia without behavioral disturbance ([place]) 10/12/2020   Malnutrition of moderate degree 09/13/2020   TIA (transient ischemic attack) 09/10/2020   Anemia of chronic disease 09/10/2020   CVA (cerebral vascular accident) ([place]) 09/10/2020   Sleeping difficulty 07/08/2020   Fatigue 06/09/2020   Mixed Alzheimer's and vascular dementia ([place]) 06/09/2020   Coccydynia 01/26/2020   Leukopenia 12/08/2019   Muscle strain 12/08/2019   Pedal edema 11/26/2019   Dysarthria and anarthria 11/07/2019   Atrial fibrillation ([place]) 10/21/2019   Nocturia 10/21/2019   Seborrheic keratosis  10/21/2019   Carotid stenosis 08/27/2019   Thyroid nodule 08/27/2019   Status post placement of implantable loop recorder 08/07/2019   Anemia 04/18/2019   Aphasia 04/12/2019   Benign hypertensive renal disease 04/07/2019   Hyperparathyroidism due to renal insufficiency ([place]) 04/07/2019   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, or unspecified 04/07/2019   Proteinuria 04/07/2019   History of stroke with current residual effects 03/15/2019   Hyperlipidemia associated with type 2 diabetes mellitus ([place]) 07/25/2018   Itching 10/23/2017   Nevus 04/17/2017   CKD (chronic kidney disease), stage V ([place]) 02/02/2015   Vitamin D deficiency 02/02/2015   Obesity (BMI 30-39.9) 08/03/2014   DM type 2 (diabetes mellitus, type 2) ([place]) 01/29/2014   Essential hypertension 01/13/2014   HLD (hyperlipidemia) 01/13/2014    Immunization History  Administered Date(s) Administered   PFIZER Comirnaty([place])Covid-19 Tri-Sucrose Vaccine 10/08/2020   PFIZER(Purple Top)SARS-COV-2 Vaccination 07/25/2019, 08/15/2019    Conditions to be addressed/monitored: CAD, HTN, HLD, DMII, and ESRD  Care Plan : Medication Management  Updates made by De Hollingshead, RPH-CPP since 02/08/2021 12:00 AM     Problem: S/p CVA, vascular dementia, CKD, T2DM      Long-Range Goal: Medication Management   Start Date: 09/09/2020  This Visit's Progress: On track  Recent Progress: On track  Priority: High  Note:   Current Barriers:  Unable to independently afford treatment regimen Unable to self administer medications as prescribed  Pharmacist Clinical Goal(s):  Over the next 90 days,patient will verbalize ability to afford treatment regimen Over the next 90 days, adhere to prescribed medication regimen through collaboration with PharmD and provider.   Interventions: 1:1 collaboration with [name], MD regarding development and update of comprehensive plan  of care as evidenced by provider attestation and co-signature Inter-disciplinary care team collaboration (see longitudinal plan of care) Comprehensive medication review performed; medication list updated in electronic medical record  SDOH: Patient lives at home. Aphasia s/p multiple strokes. Family helps with care. Granddaughter, Rayna Sexton, helps with medication review today. Granddaughter is not on DPR. Sister, [name], is on [place]. Also has an aid, [name], that is in helping with care, medication management  Medication Monitoring: Granddaughter and aid are filling pill box,  Diabetes: Controlled; though A1c likely inaccurate given HD; current  treatment: Tradjenta 5 mg daily Current glucose readings: 90-130; post prandial: 150-170s PA for Tradjenta approved through 12/25/21. Using manufacturer savings card as well Recommended to continue current regimen at this time.  Hypertension and Atrial Fibrillation with ESRD: Moderately well controlled; current treatment: amlodipine 5 mg daily, carvedilol 6.25 mg BID, hydralazine 50 mg BID, losartan 50 mg daily MWF dialysis 11-4 pm [facility]  Anticoagulant regimen: Eliquis 2.5 mg BID - now affordable w/ $10/month copay card  Daughter reports that home BP readings have been more stable lately; reading this morning was 127/71. Denies episodes of hypotension.  Recommended to continue current regimen along with collaboration with nephrology.   Hyperlipidemia and secondary ASCVD prevention Controlled per last lipid panel; current treatment: ezetimibe 10 mg, atorvastatin 80 mg daily  Previous medications: rosuvastatin (d/c d/t renal function) Antiplatelet regimen: none given concurrent DOAC Previously recommended to continue current regimen at this time. Recommend lipid panel + liver function enzymes with next blood work.  Vascular Dementia and Cognitive Impairment, Insomnia: Uncontrolled; current regimen: mirtazapine 7.5 mg QPM-  granddaughter reports this is helping with appetite Previously confirmed appropriate dosing given dialysis status Decision to avoid donepezil d/t risk of bradycardia Per granddaughter, neurology stopped memantine.  Previously recommended to continue current regimen at this time along with collaboration with neurology,    Patient Goals/Self-Care Activities Over the next 90 days, patient will:  - take medications as prescribed check blood pressure daily, document, and provide at future appointments  Follow Up Plan: Telephone follow up appointment with care management team member scheduled for: 12 weeks     Medication Assistance: None required.  Patient affirms current coverage meets needs.  [facility]'s [facility] pharmacy [facility]  [id number] [place] [id number]: [phone]: [phone] Follow Up:  Patient agrees to Care Plan and Follow-up.  Plan: Telephone follow up appointment with care management team member scheduled for:  ~ 12 weeks  Catie TDarnelle Maffucci PharmD, BQuartzsite CTempletonClinical Pharmacist LOccidental Petroleumat BJohnson & [phone]

## 2021-02-09 DIAGNOSIS — N186 End stage renal disease: Secondary | ICD-10-CM | POA: Diagnosis not present

## 2021-02-09 DIAGNOSIS — D509 Iron deficiency anemia, unspecified: Secondary | ICD-10-CM | POA: Diagnosis not present

## 2021-02-09 DIAGNOSIS — D631 Anemia in chronic kidney disease: Secondary | ICD-10-CM | POA: Diagnosis not present

## 2021-02-09 DIAGNOSIS — Z992 Dependence on renal dialysis: Secondary | ICD-10-CM | POA: Diagnosis not present

## 2021-02-09 DIAGNOSIS — N2581 Secondary hyperparathyroidism of renal origin: Secondary | ICD-10-CM | POA: Diagnosis not present

## 2021-02-11 ENCOUNTER — Telehealth: Payer: Self-pay | Admitting: Family Medicine

## 2021-02-11 DIAGNOSIS — D509 Iron deficiency anemia, unspecified: Secondary | ICD-10-CM | POA: Diagnosis not present

## 2021-02-11 DIAGNOSIS — Z992 Dependence on renal dialysis: Secondary | ICD-10-CM | POA: Diagnosis not present

## 2021-02-11 DIAGNOSIS — N2581 Secondary hyperparathyroidism of renal origin: Secondary | ICD-10-CM | POA: Diagnosis not present

## 2021-02-11 DIAGNOSIS — D631 Anemia in chronic kidney disease: Secondary | ICD-10-CM | POA: Diagnosis not present

## 2021-02-11 DIAGNOSIS — N186 End stage renal disease: Secondary | ICD-10-CM | POA: Diagnosis not present

## 2021-02-11 NOTE — Telephone Encounter (Signed)
The patients sister called and wanted me to know that the patient took a loan out for one of her granddaughters and she has no money to do a loan, she called the credit union and they informed her to get guardianship over the patient, I advised her to see legal counseling and she understood.  Zaxton Angerer,cma

## 2021-02-11 NOTE — Telephone Encounter (Signed)
Pt sister called she wanted to discuss pt behavior

## 2021-02-11 NOTE — Telephone Encounter (Signed)
Noted. Agree with advice given.

## 2021-02-14 DIAGNOSIS — N186 End stage renal disease: Secondary | ICD-10-CM | POA: Diagnosis not present

## 2021-02-14 DIAGNOSIS — Z992 Dependence on renal dialysis: Secondary | ICD-10-CM | POA: Diagnosis not present

## 2021-02-14 DIAGNOSIS — N2581 Secondary hyperparathyroidism of renal origin: Secondary | ICD-10-CM | POA: Diagnosis not present

## 2021-02-14 DIAGNOSIS — D509 Iron deficiency anemia, unspecified: Secondary | ICD-10-CM | POA: Diagnosis not present

## 2021-02-14 DIAGNOSIS — D631 Anemia in chronic kidney disease: Secondary | ICD-10-CM | POA: Diagnosis not present

## 2021-02-15 DIAGNOSIS — I120 Hypertensive chronic kidney disease with stage 5 chronic kidney disease or end stage renal disease: Secondary | ICD-10-CM | POA: Diagnosis not present

## 2021-02-15 DIAGNOSIS — D631 Anemia in chronic kidney disease: Secondary | ICD-10-CM | POA: Diagnosis not present

## 2021-02-15 DIAGNOSIS — N186 End stage renal disease: Secondary | ICD-10-CM | POA: Diagnosis not present

## 2021-02-15 DIAGNOSIS — F0280 Dementia in other diseases classified elsewhere without behavioral disturbance: Secondary | ICD-10-CM | POA: Diagnosis not present

## 2021-02-15 DIAGNOSIS — E1122 Type 2 diabetes mellitus with diabetic chronic kidney disease: Secondary | ICD-10-CM | POA: Diagnosis not present

## 2021-02-15 DIAGNOSIS — G309 Alzheimer's disease, unspecified: Secondary | ICD-10-CM | POA: Diagnosis not present

## 2021-02-16 ENCOUNTER — Encounter (INDEPENDENT_AMBULATORY_CARE_PROVIDER_SITE_OTHER): Payer: Self-pay

## 2021-02-16 ENCOUNTER — Telehealth: Payer: Medicare Other

## 2021-02-16 ENCOUNTER — Telehealth: Payer: Self-pay | Admitting: Family Medicine

## 2021-02-16 DIAGNOSIS — N2581 Secondary hyperparathyroidism of renal origin: Secondary | ICD-10-CM | POA: Diagnosis not present

## 2021-02-16 DIAGNOSIS — D631 Anemia in chronic kidney disease: Secondary | ICD-10-CM | POA: Diagnosis not present

## 2021-02-16 DIAGNOSIS — Z992 Dependence on renal dialysis: Secondary | ICD-10-CM | POA: Diagnosis not present

## 2021-02-16 DIAGNOSIS — N186 End stage renal disease: Secondary | ICD-10-CM | POA: Diagnosis not present

## 2021-02-16 DIAGNOSIS — D509 Iron deficiency anemia, unspecified: Secondary | ICD-10-CM | POA: Diagnosis not present

## 2021-02-16 NOTE — Telephone Encounter (Signed)
PT called to advise they need a refill of their ezetimibe (ZETIA) 10 MG tablet [facility]

## 2021-02-16 NOTE — Telephone Encounter (Signed)
Called to inform patient that the ezetimibe medication was sent in with 3 refills and should be at the pharmacy. Call was answered and then hung up. Did not speak to anyone nor did the call go to voicemail. Could not leave a message to call back.

## 2021-02-17 ENCOUNTER — Encounter
Admission: RE | Admit: 2021-02-17 | Discharge: 2021-02-17 | Disposition: A | Discharge status: Home / Self Care | Payer: Medicare Other | Source: Ambulatory Visit | Attending: Vascular Surgery | Admitting: Vascular Surgery

## 2021-02-17 ENCOUNTER — Other Ambulatory Visit: Payer: Self-pay

## 2021-02-17 ENCOUNTER — Other Ambulatory Visit (INDEPENDENT_AMBULATORY_CARE_PROVIDER_SITE_OTHER): Payer: Self-pay | Admitting: Nurse Practitioner

## 2021-02-17 DIAGNOSIS — Z01812 Encounter for preprocedural laboratory examination: Secondary | ICD-10-CM | POA: Insufficient documentation

## 2021-02-17 DIAGNOSIS — Z79899 Other long term (current) drug therapy: Secondary | ICD-10-CM | POA: Diagnosis not present

## 2021-02-17 DIAGNOSIS — Z7901 Long term (current) use of anticoagulants: Secondary | ICD-10-CM | POA: Diagnosis not present

## 2021-02-17 DIAGNOSIS — I120 Hypertensive chronic kidney disease with stage 5 chronic kidney disease or end stage renal disease: Secondary | ICD-10-CM | POA: Diagnosis not present

## 2021-02-17 DIAGNOSIS — N186 End stage renal disease: Secondary | ICD-10-CM | POA: Insufficient documentation

## 2021-02-17 DIAGNOSIS — E1122 Type 2 diabetes mellitus with diabetic chronic kidney disease: Secondary | ICD-10-CM | POA: Diagnosis not present

## 2021-02-17 LAB — BASIC METABOLIC PANEL
Anion gap: 12 (ref 5–15)
BUN: 18 mg/dL (ref 8–23)
CO2: 31 mmol/L (ref 22–32)
Calcium: 9.1 mg/dL (ref 8.9–10.3)
Chloride: 96 mmol/L — ABNORMAL LOW (ref 98–111)
Creatinine, Ser: 3.01 mg/dL — ABNORMAL HIGH (ref 0.44–1.00)
GFR, Estimated: 15 mL/min — ABNORMAL LOW (ref >60)
Glucose, Bld: 104 mg/dL — ABNORMAL HIGH (ref 70–99)
Potassium: 3.6 mmol/L (ref 3.5–5.1)
Sodium: 139 mmol/L (ref 135–145)

## 2021-02-17 LAB — PROTIME-INR
INR: 1.2 (ref 0.8–1.2)
Prothrombin Time: 14.8 seconds (ref 11.4–15.2)

## 2021-02-17 LAB — APTT: aPTT: 34 seconds (ref 24–36)

## 2021-02-17 NOTE — Patient Instructions (Addendum)
Your procedure is scheduled on: Thursday, August 25 Report to the Registration Desk on the 1st floor of the [facility]. To find out your arrival time, please call [phone] between 1PM - 3PM on: Wednesday, August 24  REMEMBER: Instructions that are not followed completely may result in serious medical risk, up to and including death; or upon the discretion of your surgeon and anesthesiologist your surgery may need to be rescheduled.  Do not eat food after midnight the night before surgery.  No gum chewing, lozengers or hard candies.  You may however, drink water up to 2 hours before you are scheduled to arrive for your surgery. Do not drink anything within 2 hours of your scheduled arrival time.  TAKE THESE MEDICATIONS THE MORNING OF SURGERY WITH A SIP OF WATER:  Amlodipine Atorvastatin Carvedilol Ezetimibe (zetia) Hydralazine  Follow recommendations from Cardiologist or PCP regarding stopping Eliquis. Stop taking Eliquis 2 days prior to surgery.  The last day to take Eliquis is Monday, August 22. Resume AFTER surgery per surgeon instruction.  One week prior to surgery: starting August 18 Stop Anti-inflammatories (NSAIDS) such as Advil, Aleve, Ibuprofen, Motrin, Naproxen, Naprosyn and Aspirin based products such as Excedrin, Goodys Powder, BC Powder. Stop ANY OVER THE COUNTER supplements until after surgery.  You may however, continue to take Tylenol if needed for pain up until the day of surgery.  No Alcohol for 24 hours before or after surgery.  On the morning of surgery brush your teeth with toothpaste and water, you may rinse your mouth with mouthwash if you wish. Do not swallow any toothpaste or mouthwash.  Do not wear jewelry, make-up, hairpins, clips or nail polish.  Do not wear lotions, powders, or perfumes.   Do not shave body from the neck down 48 hours prior to surgery just in case you cut yourself which could leave a site for infection.  Also, freshly shaved  skin may become irritated if using the CHG soap.  Contact lenses, hearing aids and dentures may not be worn into surgery.  Do not bring valuables to the hospital. [facility] is not responsible for any missing/lost belongings or valuables.   Use CHG wipes as directed on instruction sheet.  Notify your doctor if there is any change in your medical condition (cold, fever, infection).  Wear comfortable clothing (specific to your surgery type) to the hospital.  After surgery, you can help prevent lung complications by doing breathing exercises.  Take deep breaths and cough every 1-2 hours. Your doctor may order a device called an Incentive Spirometer to help you take deep breaths.  If you are being discharged the day of surgery, you will not be allowed to drive home. You will need a responsible adult (18 years or older) to drive you home and stay with you that night.   If you are taking public transportation, you will need to have a responsible adult (18 years or older) with you. Please confirm with your physician that it is acceptable to use public transportation.   Please call the [facility] at [phone] if you have any questions about these instructions.  Surgery Visitation Policy:  Patients undergoing a surgery or procedure may have one family member or support person with them as long as that person is not COVID-19 positive or experiencing its symptoms.  That person may remain in the waiting area during the procedure.

## 2021-02-18 DIAGNOSIS — N186 End stage renal disease: Secondary | ICD-10-CM | POA: Diagnosis not present

## 2021-02-18 DIAGNOSIS — Z992 Dependence on renal dialysis: Secondary | ICD-10-CM | POA: Diagnosis not present

## 2021-02-18 DIAGNOSIS — D631 Anemia in chronic kidney disease: Secondary | ICD-10-CM | POA: Diagnosis not present

## 2021-02-18 DIAGNOSIS — N2581 Secondary hyperparathyroidism of renal origin: Secondary | ICD-10-CM | POA: Diagnosis not present

## 2021-02-18 DIAGNOSIS — D509 Iron deficiency anemia, unspecified: Secondary | ICD-10-CM | POA: Diagnosis not present

## 2021-02-21 DIAGNOSIS — D631 Anemia in chronic kidney disease: Secondary | ICD-10-CM | POA: Diagnosis not present

## 2021-02-21 DIAGNOSIS — N186 End stage renal disease: Secondary | ICD-10-CM | POA: Diagnosis not present

## 2021-02-21 DIAGNOSIS — Z992 Dependence on renal dialysis: Secondary | ICD-10-CM | POA: Diagnosis not present

## 2021-02-21 DIAGNOSIS — N2581 Secondary hyperparathyroidism of renal origin: Secondary | ICD-10-CM | POA: Diagnosis not present

## 2021-02-21 DIAGNOSIS — D509 Iron deficiency anemia, unspecified: Secondary | ICD-10-CM | POA: Diagnosis not present

## 2021-02-21 NOTE — Telephone Encounter (Signed)
Attempted to contact [place]. Phone continued to ring and did not go to voicemail. Call was ended after receiving an automated message asking for remote access codes.

## 2021-02-21 NOTE — Progress Notes (Signed)
Perioperative Services Pre-Admission/Anesthesia Testing    Date: 02/21/21  Name: Dorcus Riga MRN:   [id number]  Re: Review and clearance for surgery   Case: [id number] Date/Time: 02/24/21 1230   Procedure: INSERTION OF ARTERIOVENOUS (AV) GORE-TEX GRAFT ARM (Left) - Left brachial Ax graft   Anesthesia type: General   Pre-op diagnosis: ESRD N18.6   Location: [facility] OR ROOM 07 / [place] [facility] FOR ANESTHESIA [facility]   Surgeons: Algernon Huxley, MD      Patient scheduled for the above procedure on 02/24/2021 with Dr. Leotis Pain, MD. Patient previously reviewed by PAT APP prior to similar procedure that was performed on 12/02/2020; see my note dated 12/01/2020. Interval health history/records reviewed.   Patient underwent an uncomplicated insertion of a LEFT brachiocephalic fistula on 12/27/9483. She discharged home on POD-0.  Since discharge from the hospital, the following interval events/appointments have occurred:  Patient presented to the [facility] emergency department on 12/05/2020 for postoperative pain and swelling to her LEFT upper extremity.  Patient had some peri-insertion site erythema.  Venous Doppler revealed no evidence of DVT.  Vascular was consulted and recommendations for oral course of ciprofloxacin and RTC visit recommended.  Patient started on antibiotics and discharged home in stable condition.    Patient seen in follow-up consult by vascular surgery on 12/07/2020 and 12/21/2020. Site too edematous at that time to be able to adequately assess her fistula. She was seen again on 01/18/2021 at which time ultrasound imaging revealed severe stenosis at the anastomosis site, however the cephalic vein itself was very small.  Concerns that fistula would not be able to mature due to small diameter of cephalic vein.  Options were discussed including fistulogram, fistula excision and replacement with a graft, or continuing dialysis with the patient being catheter dependent.  Pros and cons of each option  discussed, and patient electing to go with placement of an upper extremity AV graft.  Patient seen in follow-up by cardiology on 01/06/2021; notes reviewed. At the time of her clinic visit, the patient denied any chest pain, shortness of breath, PND, orthopnea, palpitations, significant peripheral edema, vertiginous symptoms, or presyncope/syncope.  Patient with a PMH significant for cardiovascular diagnoses.  Blood pressure mildly elevated 142/60 in the clinic despite prescribed CCB and beta-blocker therapies.  She remains on a statin for her HLD.  T2DM well-controlled on currently prescribed regimen; last Hgb A1c 5.5% when checked on 01/13/2021.  CHA2DS2-VASc Score = 7 (age x 2, sex, HTN, previous CVA x 2, T2DM).  Patient with continued compliance with prescribed daily anticoagulation therapy using apixaban without evidence of GI bleeding.  No changes were made to her medication regimen.  Patient follow-up with outpatient cardiology in 6 months or sooner if needed.  Patient previously cleared prior to vascular procedure back in 12/2020.  Given the length of time that is been since initial clearance was issued, and considering patient's multiple comorbidities, updated surgical clearance was sought from patient's primary cardiologist. Per cardiology, "this patient is optimized for surgery and may proceed with the planned procedural course with a LOW risk of significant perioperative cardiovascular complications".  This patient has been cleared by cardiology to hold her apixaban.  Surgery requesting a 2-day hold..  Patient is aware that her last dose of apixaban will be on 02/21/2021.  Patient denies any previous perioperative complications with anesthesia in the past.  In review of the available medical records, it is noted that the patient underwent a general anesthetic course here (ASA IV) on 12/02/2020 without  any documented complications.  Pertinent Clinical Results:   Hospital Outpatient Visit on  02/17/2021  Component Date Value Ref Range Status   aPTT 02/17/2021 34  24 - 36 seconds Final   Performed at [facility], [place], [place] [address]   Sodium 02/17/2021 139  135 - 145 mmol/L Final   Potassium 02/17/2021 3.6  3.5 - 5.1 mmol/L Final   HEMOLYSIS AT THIS LEVEL MAY AFFECT RESULT   Chloride 02/17/2021 96 (A) 98 - 111 mmol/L Final   CO2 02/17/2021 31  22 - 32 mmol/L Final   Glucose, Bld 02/17/2021 104 (A) 70 - 99 mg/dL Final   Glucose reference range applies only to samples taken after fasting for at least 8 hours.   BUN 02/17/2021 18  8 - 23 mg/dL Final   Creatinine, Ser 02/17/2021 3.01 (A) 0.44 - 1.00 mg/dL Final   Calcium 02/17/2021 9.1  8.9 - 10.3 mg/dL Final   GFR, Estimated 02/17/2021 15 (A) >60 mL/min Final   Comment: (NOTE) Calculated using the CKD-EPI Creatinine Equation (2021)    Anion gap 02/17/2021 12  5 - 15 Final   Performed at [facility], [address] [place], [place] [address]   Prothrombin Time 02/17/2021 14.8  11.4 - 15.2 seconds Final   INR 02/17/2021 1.2  0.8 - 1.2 Final   Comment: (NOTE) INR goal varies based on device and disease states. Performed at [facility], [address] [place],  [address]    ECG: Date: 11/30/2020 Time ECG obtained: 1318 PM Rate: 71 bpm Axis: Left axis deviation Rhythm:  Normal sinus rhythm, LAFB Intervals: PR 200 ms. QRS 102 ms. QTc 475 ms. ST segment and T wave changes: No evidence of acute ST segment elevation or depression; evidence of an age undetermined anterior infarct present; LAFB now present.  Comparison: Similar to previous tracing obtained on 09/10/2020.    IMAGING / PROCEDURES: BILATERAL CAROTID DOPPLER performed on 09/11/2020 Right Carotid:  Velocities in the right ICA are consistent with a 1-39% stenosis.  Left Carotid:  Velocities in the left ICA are consistent with a 1-39% stenosis.  Vertebrals:   Right vertebral artery demonstrates  antegrade flow.  Left vertebral artery was not visualized. Subclavians:  Normal flow hemodynamics were seen in bilateral subclavian arteries.   TRANSTHORACIC ECHOCARDIOGRAM performed on 09/11/2020 Left ventricular ejection fraction, by estimation, is 60 to 65%. The left ventricle has normal function. The left ventricle has no regional wall motion abnormalities. There is moderate left ventricular hypertrophy. Left ventricular diastolic parameters are consistent with Grade I diastolic dysfunction (impaired relaxation).  Right ventricular systolic function is normal. The right ventricular size is normal. Tricuspid regurgitation signal is inadequate for assessing PA pressure.  The mitral valve is normal in structure. Trivial mitral valve regurgitation. No evidence of mitral stenosis.  The aortic valve is normal in structure. There is mild calcification of the aortic valve. Aortic valve regurgitation is not visualized. No aortic stenosis is present.  The inferior vena cava is normal in size with <50% respiratory variability, suggesting right atrial pressure of 8 mmHg.   MRI BRAIN WITHOUT CONTRAST performed on 09/12/2020 Truncated exam. Acute ischemia is present along the anterior - and especially posterior - margins of the Right MCA infarct that occurred last month. No malignant hemorrhagic transformation or mass effect. Expected developing encephalomalacia and laminar necrosis in the parenchyma affected last month. Underlying fairly advanced chronic small vessel disease.   MRA HEAD WITHOUT CONTRAST performed on  08/21/2019 Positive for large vessel occlusion of the Right MCA distal M1. The right anterior temporal artery remains patent but otherwise no significant collaterals are detected by MRA. Generally mild intracranial stenosis related to atherosclerosis elsewhere, although a severe Right ACA A2 stenosis is also noted. Infundibulum of the right ophthalmic artery origin suspected (normal  variant).   MRI BRAIN WITHOUT CONTRAST performed on 08/20/2019 Acute to subacute recurrent right MCA territory infarct,confluent in the operculum. No associated hemorrhage or mass effect. Elsewhere stable underlying chronic ischemic and small vessel disease since October.   TRANSESOPHAGEAL ECHOCARDIOGRAM performed on 04/14/2019 Left ventricular ejection fraction, by visual estimation, is 60 to 65%. The left ventricle has normal function. Left ventricular septal wall thickness was normal. Normal left ventricular posterior wall thickness. There is no left ventricular hypertrophy.  Global right ventricle has normal systolic function.The right ventricular size is normal. No increase in right ventricular wall thickness.  Left atrial size was normal.  Right atrial size was normal.  The mitral valve is grossly normal. Trace mitral valve regurgitation.  The tricuspid valve is grossly normal. Tricuspid valve regurgitation is trivial.  The aortic valve is tricuspid Aortic valve regurgitation is mild by color flow Doppler. Structurally normal aortic valve, with no evidence of sclerosis or stenosis.  The pulmonic valve was not well visualized. Pulmonic valve regurgitation is trivial by color flow Doppler.  The aortic root was not well visualized.    MRI BRAIN and MRA NECK without contrast performed on 04/12/2019 Multiple new foci of acute infarction affect the RIGHT hemisphere affecting the frontal, posterior frontal, posterior temporal, anterior parietal cortex and regional white matter. The linear distribution of the lesions raises the question of watershed type hypoperfusion insult, versus a shower of emboli. Moderate atrophy with extensive small vessel disease. No extracranial flow reducing lesion is evident, within limits for detection on noncontrast MRA neck.   MRI BRAIN WITHOUT CONTRAST performed on 03/15/2019 Scattered, patchy acute infarcts in the right MCA territory white matter and the right  PCA territory. As it appears there is a fetal type right PCA origin (normal vascular variant), this might reflect a recent embolic event from the right carotid. No associated hemorrhage or mass effect. Underlying moderately advanced signal changes suggesting chronic small vessel disease, including chronic microhemorrhages in the thalami and right occipital pole.  Impression and Plan:  Sirenia Whitis has been referred for pre-anesthesia review and clearance prior to her undergoing the planned anesthetic and procedural courses. Available labs, pertinent testing, and imaging results were personally reviewed by me. This patient has been appropriately cleared by cardiology with an overall LOW risk of significant perioperative cardiovascular complications.  Patient was a difficult stick in PAT. Obtaining all of the required labs was not possible and patient asked not to have additional phlebotomy attempts made. Will need to obtained the remaining labs on the day of her procedure. She will need a CBC and T&S. Orders have been placed for SDS to obtain. May place IV team consult order as needed for assistance due to poor venous access (single laterality available for use).   Based on clinical review performed today (02/21/21), barring any significant acute changes in the patient's overall condition, it is anticipated that she will be able to proceed with the planned surgical intervention. Any acute changes in clinical condition may necessitate her procedure being postponed and/or cancelled. Patient will meet with anesthesia team (MD and/or CRNA) on this day of her procedure for preoperative evaluation/assessment.   Pre-surgical instructions were reviewed  with the patient during her PAT appointment and questions were fielded by PAT clinical staff. Patient was advised that if any questions or concerns arise prior to her procedure then she should return a call to PAT and/or her surgeon's office to discuss.  Honor Loh, MSN, APRN, FNP-C, CEN [facility]  Peri-operative Services Nurse Practitioner Phone: [phone] 02/21/21 8:58 AM  NOTE: This note has been prepared using Dragon dictation software. Despite my best ability to proofread, there is always the potential that unintentional transcriptional errors may still occur from this process.

## 2021-02-22 DIAGNOSIS — E1122 Type 2 diabetes mellitus with diabetic chronic kidney disease: Secondary | ICD-10-CM | POA: Diagnosis not present

## 2021-02-22 DIAGNOSIS — F0280 Dementia in other diseases classified elsewhere without behavioral disturbance: Secondary | ICD-10-CM | POA: Diagnosis not present

## 2021-02-22 DIAGNOSIS — I120 Hypertensive chronic kidney disease with stage 5 chronic kidney disease or end stage renal disease: Secondary | ICD-10-CM | POA: Diagnosis not present

## 2021-02-22 DIAGNOSIS — D631 Anemia in chronic kidney disease: Secondary | ICD-10-CM | POA: Diagnosis not present

## 2021-02-22 DIAGNOSIS — N186 End stage renal disease: Secondary | ICD-10-CM | POA: Diagnosis not present

## 2021-02-22 DIAGNOSIS — G309 Alzheimer's disease, unspecified: Secondary | ICD-10-CM | POA: Diagnosis not present

## 2021-02-22 NOTE — Telephone Encounter (Signed)
Attempted to contact [facility]. Call did not connect and went to Dial tone. A mychart message has been sent asking the patient to call the office.

## 2021-02-23 DIAGNOSIS — N2581 Secondary hyperparathyroidism of renal origin: Secondary | ICD-10-CM | POA: Diagnosis not present

## 2021-02-23 DIAGNOSIS — D631 Anemia in chronic kidney disease: Secondary | ICD-10-CM | POA: Diagnosis not present

## 2021-02-23 DIAGNOSIS — N186 End stage renal disease: Secondary | ICD-10-CM | POA: Diagnosis not present

## 2021-02-23 DIAGNOSIS — Z992 Dependence on renal dialysis: Secondary | ICD-10-CM | POA: Diagnosis not present

## 2021-02-23 DIAGNOSIS — D509 Iron deficiency anemia, unspecified: Secondary | ICD-10-CM | POA: Diagnosis not present

## 2021-02-24 ENCOUNTER — Encounter
Admission: RE | Disposition: A | Discharge status: Home / Self Care | Payer: Self-pay | Source: Home / Self Care | Attending: Vascular Surgery

## 2021-02-24 ENCOUNTER — Encounter: Payer: Self-pay | Admitting: Vascular Surgery

## 2021-02-24 ENCOUNTER — Ambulatory Visit
Admission: RE | Admit: 2021-02-24 | Discharge: 2021-02-24 | Disposition: A | Discharge status: Home / Self Care | Payer: Medicare Other | Source: Home / Self Care | Attending: Vascular Surgery | Admitting: Vascular Surgery

## 2021-02-24 ENCOUNTER — Other Ambulatory Visit: Payer: Self-pay

## 2021-02-24 ENCOUNTER — Ambulatory Visit: Payer: Medicare Other | Admitting: Urgent Care

## 2021-02-24 ENCOUNTER — Ambulatory Visit: Payer: Medicare Other

## 2021-02-24 DIAGNOSIS — I639 Cerebral infarction, unspecified: Secondary | ICD-10-CM | POA: Diagnosis not present

## 2021-02-24 DIAGNOSIS — I1311 Hypertensive heart and chronic kidney disease without heart failure, with stage 5 chronic kidney disease, or end stage renal disease: Secondary | ICD-10-CM | POA: Diagnosis not present

## 2021-02-24 DIAGNOSIS — G9389 Other specified disorders of brain: Secondary | ICD-10-CM | POA: Diagnosis not present

## 2021-02-24 DIAGNOSIS — X58XXXA Exposure to other specified factors, initial encounter: Secondary | ICD-10-CM | POA: Insufficient documentation

## 2021-02-24 DIAGNOSIS — T82898A Other specified complication of vascular prosthetic devices, implants and grafts, initial encounter: Secondary | ICD-10-CM | POA: Insufficient documentation

## 2021-02-24 DIAGNOSIS — I63511 Cerebral infarction due to unspecified occlusion or stenosis of right middle cerebral artery: Secondary | ICD-10-CM | POA: Diagnosis not present

## 2021-02-24 DIAGNOSIS — I6782 Cerebral ischemia: Secondary | ICD-10-CM | POA: Diagnosis not present

## 2021-02-24 DIAGNOSIS — R42 Dizziness and giddiness: Secondary | ICD-10-CM | POA: Diagnosis not present

## 2021-02-24 DIAGNOSIS — E1122 Type 2 diabetes mellitus with diabetic chronic kidney disease: Secondary | ICD-10-CM | POA: Insufficient documentation

## 2021-02-24 DIAGNOSIS — R600 Localized edema: Secondary | ICD-10-CM | POA: Diagnosis not present

## 2021-02-24 DIAGNOSIS — T82590A Other mechanical complication of surgically created arteriovenous fistula, initial encounter: Secondary | ICD-10-CM | POA: Diagnosis not present

## 2021-02-24 DIAGNOSIS — Z8249 Family history of ischemic heart disease and other diseases of the circulatory system: Secondary | ICD-10-CM | POA: Insufficient documentation

## 2021-02-24 DIAGNOSIS — Z888 Allergy status to other drugs, medicaments and biological substances status: Secondary | ICD-10-CM | POA: Insufficient documentation

## 2021-02-24 DIAGNOSIS — Z823 Family history of stroke: Secondary | ICD-10-CM | POA: Insufficient documentation

## 2021-02-24 DIAGNOSIS — Z20822 Contact with and (suspected) exposure to covid-19: Secondary | ICD-10-CM | POA: Diagnosis not present

## 2021-02-24 DIAGNOSIS — D631 Anemia in chronic kidney disease: Secondary | ICD-10-CM | POA: Diagnosis not present

## 2021-02-24 DIAGNOSIS — Z841 Family history of disorders of kidney and ureter: Secondary | ICD-10-CM | POA: Insufficient documentation

## 2021-02-24 DIAGNOSIS — Z992 Dependence on renal dialysis: Secondary | ICD-10-CM | POA: Insufficient documentation

## 2021-02-24 DIAGNOSIS — Z419 Encounter for procedure for purposes other than remedying health state, unspecified: Secondary | ICD-10-CM

## 2021-02-24 DIAGNOSIS — I120 Hypertensive chronic kidney disease with stage 5 chronic kidney disease or end stage renal disease: Secondary | ICD-10-CM | POA: Diagnosis not present

## 2021-02-24 DIAGNOSIS — M79602 Pain in left arm: Secondary | ICD-10-CM | POA: Diagnosis not present

## 2021-02-24 DIAGNOSIS — I770 Arteriovenous fistula, acquired: Secondary | ICD-10-CM | POA: Diagnosis not present

## 2021-02-24 DIAGNOSIS — M79632 Pain in left forearm: Secondary | ICD-10-CM | POA: Diagnosis not present

## 2021-02-24 DIAGNOSIS — Z833 Family history of diabetes mellitus: Secondary | ICD-10-CM | POA: Insufficient documentation

## 2021-02-24 DIAGNOSIS — G8194 Hemiplegia, unspecified affecting left nondominant side: Secondary | ICD-10-CM | POA: Diagnosis not present

## 2021-02-24 DIAGNOSIS — N186 End stage renal disease: Secondary | ICD-10-CM | POA: Insufficient documentation

## 2021-02-24 DIAGNOSIS — N185 Chronic kidney disease, stage 5: Secondary | ICD-10-CM | POA: Diagnosis not present

## 2021-02-24 HISTORY — PX: AV FISTULA PLACEMENT: SHX1204

## 2021-02-24 LAB — POCT I-STAT, CHEM 8
BUN: 20 mg/dL (ref 8–23)
Calcium, Ion: 1.04 mmol/L — ABNORMAL LOW (ref 1.15–1.40)
Chloride: 97 mmol/L — ABNORMAL LOW (ref 98–111)
Creatinine, Ser: 3 mg/dL — ABNORMAL HIGH (ref 0.44–1.00)
Glucose, Bld: 156 mg/dL — ABNORMAL HIGH (ref 70–99)
HCT: 27 % — ABNORMAL LOW (ref 36.0–46.0)
Hemoglobin: 9.2 g/dL — ABNORMAL LOW (ref 12.0–15.0)
Potassium: 3.6 mmol/L (ref 3.5–5.1)
Sodium: 141 mmol/L (ref 135–145)
TCO2: 34 mmol/L — ABNORMAL HIGH (ref 22–32)

## 2021-02-24 LAB — CBC
HCT: 28.2 % — ABNORMAL LOW (ref 36.0–46.0)
Hemoglobin: 10 g/dL — ABNORMAL LOW (ref 12.0–15.0)
MCH: 31.3 pg (ref 26.0–34.0)
MCHC: 35.5 g/dL (ref 30.0–36.0)
MCV: 88.4 fL (ref 80.0–100.0)
Platelets: 111 10*3/uL — ABNORMAL LOW (ref 150–400)
RBC: 3.19 MIL/uL — ABNORMAL LOW (ref 3.87–5.11)
RDW: 13.4 % (ref 11.5–15.5)
WBC: 2.8 10*3/uL — ABNORMAL LOW (ref 4.0–10.5)
nRBC: 0 % (ref 0.0–0.2)

## 2021-02-24 LAB — TYPE AND SCREEN
ABO/RH(D): A POS
Antibody Screen: NEGATIVE

## 2021-02-24 LAB — GLUCOSE, CAPILLARY: Glucose-Capillary: 122 mg/dL — ABNORMAL HIGH (ref 70–99)

## 2021-02-24 SURGERY — INSERTION OF ARTERIOVENOUS (AV) GORE-TEX GRAFT ARM
Anesthesia: Regional | Site: Arm Upper | Laterality: Left

## 2021-02-24 MED ORDER — SODIUM CHLORIDE 0.9 % IV SOLN
INTRAVENOUS | Status: DC
  Administered 2021-02-24: 10 mL/h via INTRAVENOUS

## 2021-02-24 MED ORDER — CHLORHEXIDINE GLUCONATE CLOTH 2 % EX PADS: 6.0000 | MEDICATED_PAD | Freq: Once | CUTANEOUS | Status: DC

## 2021-02-24 MED ORDER — KETAMINE HCL 10 MG/ML IJ SOLN
INTRAMUSCULAR | Status: DC | PRN
  Administered 2021-02-24: 20 mg via INTRAVENOUS

## 2021-02-24 MED ORDER — ONDANSETRON HCL 4 MG/2ML IJ SOLN
INTRAMUSCULAR | Status: DC | PRN
  Administered 2021-02-24: 4 mg via INTRAVENOUS

## 2021-02-24 MED ORDER — PROPOFOL 500 MG/50ML IV EMUL
INTRAVENOUS | Status: AC
  Filled 2021-02-24: qty 50

## 2021-02-24 MED ORDER — FENTANYL CITRATE (PF) 100 MCG/2ML IJ SOLN
INTRAMUSCULAR | Status: AC
  Filled 2021-02-24: qty 2

## 2021-02-24 MED ORDER — HYDROCODONE-ACETAMINOPHEN 5-325 MG PO TABS: 1.0000 | ORAL_TABLET | Freq: Four times a day (QID) | ORAL | 0 refills | Status: DC | PRN

## 2021-02-24 MED ORDER — ROPIVACAINE HCL 5 MG/ML IJ SOLN
INTRAMUSCULAR | Status: DC | PRN
  Administered 2021-02-24: 30 mL via EPIDURAL

## 2021-02-24 MED ORDER — PROPOFOL 10 MG/ML IV BOLUS
INTRAVENOUS | Status: DC | PRN
  Administered 2021-02-24: 20 mg via INTRAVENOUS

## 2021-02-24 MED ORDER — MIDAZOLAM HCL 2 MG/2ML IJ SOLN
INTRAMUSCULAR | Status: AC
  Administered 2021-02-24: 1 mg via INTRAVENOUS
  Filled 2021-02-24: qty 2

## 2021-02-24 MED ORDER — ORAL CARE MOUTH RINSE: 15.0000 mL | Freq: Once | OROMUCOSAL | Status: AC

## 2021-02-24 MED ORDER — CEFAZOLIN SODIUM-DEXTROSE 1-4 GM/50ML-% IV SOLN
INTRAVENOUS | Status: AC
  Filled 2021-02-24: qty 50

## 2021-02-24 MED ORDER — PROPOFOL 500 MG/50ML IV EMUL
INTRAVENOUS | Status: DC | PRN
  Administered 2021-02-24: 75 ug/kg/min via INTRAVENOUS

## 2021-02-24 MED ORDER — CHLORHEXIDINE GLUCONATE 0.12 % MT SOLN
15.0000 mL | Freq: Once | OROMUCOSAL | Status: AC
  Administered 2021-02-24: 15 mL via OROMUCOSAL

## 2021-02-24 MED ORDER — FAMOTIDINE 20 MG PO TABS
ORAL_TABLET | ORAL | Status: AC
  Filled 2021-02-24: qty 1

## 2021-02-24 MED ORDER — HEMOSTATIC AGENTS (NO CHARGE) OPTIME
TOPICAL | Status: DC | PRN
  Administered 2021-02-24 (×2): 1 via TOPICAL

## 2021-02-24 MED ORDER — HEPARIN SODIUM (PORCINE) 5000 UNIT/ML IJ SOLN
INTRAMUSCULAR | Status: AC
  Filled 2021-02-24: qty 1

## 2021-02-24 MED ORDER — FENTANYL CITRATE (PF) 100 MCG/2ML IJ SOLN
INTRAMUSCULAR | Status: DC | PRN
  Administered 2021-02-24: 50 ug via INTRAVENOUS

## 2021-02-24 MED ORDER — HEPARIN SODIUM (PORCINE) 1000 UNIT/ML IJ SOLN
INTRAMUSCULAR | Status: DC | PRN
  Administered 2021-02-24: 3000 [IU] via INTRAVENOUS

## 2021-02-24 MED ORDER — KETAMINE HCL 50 MG/5ML IJ SOSY
PREFILLED_SYRINGE | INTRAMUSCULAR | Status: AC
  Filled 2021-02-24: qty 5

## 2021-02-24 MED ORDER — "VISTASEAL 4 ML SINGLE DOSE KIT "
PACK | CUTANEOUS | Status: DC | PRN
  Administered 2021-02-24: 4 mL via TOPICAL

## 2021-02-24 MED ORDER — BUPIVACAINE-EPINEPHRINE (PF) 0.5% -1:200000 IJ SOLN
INTRAMUSCULAR | Status: DC | PRN
  Administered 2021-02-24: 10 mL

## 2021-02-24 MED ORDER — FAMOTIDINE 20 MG PO TABS
20.0000 mg | ORAL_TABLET | Freq: Once | ORAL | Status: AC
  Administered 2021-02-24: 20 mg via ORAL

## 2021-02-24 MED ORDER — CEFAZOLIN SODIUM-DEXTROSE 1-4 GM/50ML-% IV SOLN
1.0000 g | INTRAVENOUS | Status: AC
  Administered 2021-02-24: 1 g via INTRAVENOUS

## 2021-02-24 MED ORDER — LIDOCAINE HCL (CARDIAC) PF 100 MG/5ML IV SOSY
PREFILLED_SYRINGE | INTRAVENOUS | Status: DC | PRN
  Administered 2021-02-24: 40 mg via INTRATRACHEAL

## 2021-02-24 MED ORDER — MIDAZOLAM HCL 2 MG/2ML IJ SOLN: 1.0000 mg | Freq: Once | INTRAMUSCULAR | Status: AC

## 2021-02-24 MED ORDER — PHENYLEPHRINE HCL (PRESSORS) 10 MG/ML IV SOLN
INTRAVENOUS | Status: DC | PRN
  Administered 2021-02-24 (×2): 100 ug via INTRAVENOUS
  Administered 2021-02-24: 50 ug via INTRAVENOUS
  Administered 2021-02-24: 100 ug via INTRAVENOUS

## 2021-02-24 MED ORDER — MIDAZOLAM HCL 2 MG/2ML IJ SOLN
1.0000 mg | Freq: Once | INTRAMUSCULAR | Status: AC
  Administered 2021-02-24: 1 mg via INTRAVENOUS

## 2021-02-24 MED ORDER — ROPIVACAINE HCL 5 MG/ML IJ SOLN
INTRAMUSCULAR | Status: AC
  Filled 2021-02-24: qty 30

## 2021-02-24 MED ORDER — EPHEDRINE SULFATE 50 MG/ML IJ SOLN
INTRAMUSCULAR | Status: DC | PRN
  Administered 2021-02-24: 5 mg via INTRAVENOUS
  Administered 2021-02-24: 10 mg via INTRAVENOUS
  Administered 2021-02-24 (×2): 5 mg via INTRAVENOUS

## 2021-02-24 MED ORDER — CHLORHEXIDINE GLUCONATE 0.12 % MT SOLN
OROMUCOSAL | Status: AC
  Filled 2021-02-24: qty 15

## 2021-02-24 MED ORDER — SODIUM CHLORIDE 0.9 % IV SOLN
INTRAVENOUS | Status: DC | PRN
  Administered 2021-02-24: 50 mL

## 2021-02-24 SURGICAL SUPPLY — 58 items
ADH SKN CLS APL DERMABOND .7 (GAUZE/BANDAGES/DRESSINGS) ×1
APL PRP STRL LF DISP 70% ISPRP (MISCELLANEOUS) ×1
BAG DECANTER FOR FLEXI CONT (MISCELLANEOUS) ×2 IMPLANT
BLADE SURG SZ11 CARB STEEL (BLADE) ×4 IMPLANT
BOOT SUTURE AID YELLOW STND (SUTURE) ×2 IMPLANT
BRUSH SCRUB EZ  4% CHG (MISCELLANEOUS) ×1
BRUSH SCRUB EZ 4% CHG (MISCELLANEOUS) ×1 IMPLANT
CANISTER SUCT 1200ML W/VALVE (MISCELLANEOUS) IMPLANT
CHLORAPREP W/TINT 26 (MISCELLANEOUS) ×2 IMPLANT
CLIP SPRNG 6MM S-JAW DBL (CLIP) ×2
DECANTER SPIKE VIAL GLASS SM (MISCELLANEOUS) ×2 IMPLANT
DERMABOND ADVANCED (GAUZE/BANDAGES/DRESSINGS) ×1
DERMABOND ADVANCED .7 DNX12 (GAUZE/BANDAGES/DRESSINGS) ×1 IMPLANT
ELECT CAUTERY BLADE 6.4 (BLADE) ×2 IMPLANT
ELECT REM PT RETURN 9FT ADLT (ELECTROSURGICAL) ×2
ELECTRODE REM PT RTRN 9FT ADLT (ELECTROSURGICAL) ×1 IMPLANT
GAUZE 4X4 16PLY ~~LOC~~+RFID DBL (SPONGE) ×2 IMPLANT
GLOVE SURG ENC MOIS LTX SZ7 (GLOVE) ×2 IMPLANT
GLOVE SURG SYN 7.0 (GLOVE) ×2 IMPLANT
GLOVE SURG UNDER LTX SZ7.5 (GLOVE) ×2 IMPLANT
GOWN STRL REUS W/ TWL LRG LVL3 (GOWN DISPOSABLE) ×1 IMPLANT
GOWN STRL REUS W/ TWL XL LVL3 (GOWN DISPOSABLE) ×2 IMPLANT
GOWN STRL REUS W/TWL LRG LVL3 (GOWN DISPOSABLE) ×2
GOWN STRL REUS W/TWL XL LVL3 (GOWN DISPOSABLE) ×4
GRAFT PROPATEN STD WALL 6X40 (Vascular Products) ×2 IMPLANT
HEMOSTAT SURGICEL 2X3 (HEMOSTASIS) ×4 IMPLANT
IV NS 500ML (IV SOLUTION) ×2
IV NS 500ML BAXH (IV SOLUTION) ×1 IMPLANT
KIT TURNOVER KIT A (KITS) ×2 IMPLANT
LABEL OR SOLS (LABEL) ×2 IMPLANT
LOOP RED MAXI  1X406MM (MISCELLANEOUS) ×1
LOOP VESSEL MAXI 1X406 RED (MISCELLANEOUS) ×1 IMPLANT
LOOP VESSEL MINI 0.8X406 BLUE (MISCELLANEOUS) ×1 IMPLANT
LOOPS BLUE MINI 0.8X406MM (MISCELLANEOUS) ×1
MANIFOLD NEPTUNE II (INSTRUMENTS) ×2 IMPLANT
NEEDLE FILTER BLUNT 18X 1/2SAF (NEEDLE) ×1
NEEDLE FILTER BLUNT 18X1 1/2 (NEEDLE) ×1 IMPLANT
NS IRRIG 500ML POUR BTL (IV SOLUTION) ×2 IMPLANT
PACK EXTREMITY ARMC (MISCELLANEOUS) ×2 IMPLANT
PAD PREP 24X41 OB/GYN DISP (PERSONAL CARE ITEMS) ×2 IMPLANT
SLING ARM M TX990204 (SOFTGOODS) ×2 IMPLANT
SOLUTION CELL SAVER (CLIP) ×1 IMPLANT
STOCKINETTE STRL 4IN 9604848 (GAUZE/BANDAGES/DRESSINGS) ×2 IMPLANT
SUT GORETEX CV-6TTC-13 36IN (SUTURE) ×4 IMPLANT
SUT MNCRL AB 4-0 PS2 18 (SUTURE) ×2 IMPLANT
SUT PROLENE 6 0 BV (SUTURE) ×8 IMPLANT
SUT SILK 0 SH 30 (SUTURE) ×2 IMPLANT
SUT SILK 2 0 (SUTURE) ×2
SUT SILK 2-0 18XBRD TIE 12 (SUTURE) ×1 IMPLANT
SUT SILK 3 0 (SUTURE) ×2
SUT SILK 3-0 18XBRD TIE 12 (SUTURE) ×1 IMPLANT
SUT SILK 4 0 (SUTURE) ×2
SUT SILK 4-0 18XBRD TIE 12 (SUTURE) ×1 IMPLANT
SUT VIC AB 3-0 SH 27 (SUTURE) ×2
SUT VIC AB 3-0 SH 27X BRD (SUTURE) ×1 IMPLANT
SYR 20ML LL LF (SYRINGE) ×2 IMPLANT
SYR 3ML LL SCALE MARK (SYRINGE) ×2 IMPLANT
WATER STERILE IRR 500ML POUR (IV SOLUTION) ×2 IMPLANT

## 2021-02-24 NOTE — Anesthesia Preprocedure Evaluation (Addendum)
Anesthesia Evaluation  Patient identified by MRN, date of birth, ID band Patient awake    Reviewed: Allergy & Precautions, H&P , NPO status , Patient's Chart, lab work & pertinent test results, reviewed documented beta blocker date and time   Airway Mallampati: III  TM Distance: >3 FB Neck ROM: limited    Dental  (+) Edentulous Upper, Chipped, Missing   Pulmonary shortness of breath, sleep apnea ,    Pulmonary exam normal        Cardiovascular hypertension, Pt. on medications and Pt. on home beta blockers Normal cardiovascular exam     Neuro/Psych  Headaches, PSYCHIATRIC DISORDERS (Pt Alert. oriented, answers questions appropriately) Dementia L arm weakness, speech residual TIA Neuromuscular disease CVA, Residual Symptoms    GI/Hepatic (+) Hepatitis -, C  Endo/Other  diabetes  Renal/GU Dialysis and ESRFRenal diseaseDialysis yesterday     Musculoskeletal   Abdominal   Peds  Hematology  (+) Blood dyscrasia (Eliquis Held 3 days), anemia ,   Anesthesia Other Findings A-fib ([place])    Alzheimer disease ([place])   Anemia    Carotid stenosis    Cerebral infarction ([place]) 04/12/2019 Multiple new foci of acute infarction affect the RIGHT hemisphere affecting the frontal, posterior frontal, posterior temporal, anterior parietal cortex and regional white matter.  Cerebral infarction due to embolism of right middle cerebral artery ([place]) 08/20/2019 RIGHT MCA distal M1  Chronic anticoagulation  Apixaban  CKD (chronic kidney disease), stage 4(HCC)    Embolic stroke involving cerebral artery ([place]) 03/15/2019 RIGHT MCA/PCA territory  Encephalomalacia  RIGHT posterior MCA territory  HCV (hepatitis C virus)   History of blood transfusion   Hyperlipidemia    Hypertension    OSA (obstructive sleep apnea)  Status post placement of implantable loop recorder 07/30/2019  T2DM (type 2 diabetes mellitus) ([place])        Reproductive/Obstetrics                            Anesthesia Physical  Anesthesia Plan  ASA: 4  Anesthesia Plan: General and Regional   Post-op Pain Management:  Regional for Post-op pain and GA combined w/ Regional for post-op pain   Induction: Intravenous  PONV Risk Score and Plan: 2 and Treatment may vary due to age or medical condition and Propofol infusion  Airway Management Planned: Nasal Cannula and Natural Airway  Additional Equipment:   Intra-op Plan:   Post-operative Plan:   Informed Consent: I have reviewed the patients History and Physical, chart, labs and discussed the procedure including the risks, benefits and alternatives for the proposed anesthesia with the patient or authorized representative who has indicated his/her understanding and acceptance.     Dental Advisory Given  Plan Discussed with: CRNA, Anesthesiologist and Surgeon  Anesthesia Plan Comments: (Discussed regional block/TIVA w pt and sister. Accepted. Off eliquis x 3d)       Anesthesia Quick Evaluation

## 2021-02-24 NOTE — Anesthesia Postprocedure Evaluation (Signed)
Anesthesia Post Note  Patient: Forensic scientist  Procedure(s) Performed: INSERTION OF ARTERIOVENOUS (AV) GORE-TEX GRAFT ARM (Left: Arm Upper)  Patient location during evaluation: PACU Anesthesia Type: Regional Level of consciousness: awake and alert Pain management: pain level controlled Vital Signs Assessment: post-procedure vital signs reviewed and stable Respiratory status: spontaneous breathing, nonlabored ventilation and respiratory function stable Cardiovascular status: blood pressure returned to baseline and stable Postop Assessment: no apparent nausea or vomiting Anesthetic complications: no   No notable events documented.   Last Vitals:  Vitals:   02/24/21 1645 02/24/21 1702  BP: (!) 149/56 (!) 140/58  Pulse: (!) 59 65  Resp: 15 16  Temp:  (!) 36.1 C  SpO2: 100% 100%    Last Pain:  Vitals:   02/24/21 1702  TempSrc: Temporal  PainSc: 0-No pain                 Iran Ouch

## 2021-02-24 NOTE — Discharge Instructions (Signed)

## 2021-02-24 NOTE — Op Note (Addendum)
[facility]  OPERATIVE NOTE   PROCEDURE:  Left upper arm brachial artery to axillary vein arteriovenous graft  PRE-OPERATIVE DIAGNOSIS: 1. end stage renal disease  2. Failed left arm AVF  POST-OPERATIVE DIAGNOSIS: same  SURGEON: Leotis Pain  ASSISTANT(S): Hezzie Bump, PA-C  ANESTHESIA: general  ESTIMATED BLOOD LOSS: 25 cc  FINDING(S): none  SPECIMEN(S):  None  INDICATIONS:   Brianna Dodson is a 87 y.o. female who presents with end stage renal disease and needs permanent access with AVG planned after failed AVF.  Risk, benefits, and alternatives to access surgery were discussed.  The patient is aware the risks include but are not limited to: bleeding, infection, steal syndrome, nerve damage, ischemic monomelic neuropathy, failure to mature, and need for additional procedures.  The patient is aware of the risks and elects to proceed forward. An assistant was present during the procedure to help facilitate the exposure and expedite the procedure.   DESCRIPTION: After full informed written consent was obtained from the patient, the patient was brought back to the operating room and placed supine upon the operating table.  The patient was given IV antibiotics prior to proceeding.  After obtaining adequate sedation, the patient was prepped and draped in standard fashion for a left arm access procedure. The assistant provided retraction and mobilization to help facilitate exposure and expedite the procedure throughout the entire procedure.  This included following suture, using retractors, and optimizing lighting.  I turned my attention first to the antecubitum.   I made an incision over the brachial artery, and dissected down through the subcutaneous tissue to the fascia carefully and was able to dissect out the brachial artery.  The artery was about patent and of adequate size to support a graft.  It was controlled proximally and distally with vessel loops and then I  turned my attention to the high bicipital groove in the axilla.  I made an incision and dissected down through the subcutaneous tissue and fascia until I reached the axillary vein.  It was patent and adequate size for graft creation.  I then dissected this vein proximally and distal and prepared it for control with Bulldog clamps.  I took a Dietitian and dissected from the antecubital up to the axillary incision.  Then I delivered the 6 mm Propaten pTFE graft, through this metal tunneler and then pulled out the metal tunneler leaving the graft in place and making sure the line was up for orientation.  I then gave the patient 3000 units of heparin to gain some anticoagulation.  After waiting 3 minutes, I placed the brachial artery under tension proximally and distally with vessel loops, made an arteriotomy and extended it with a Potts scissor.  I sewed the graft to this arteriotomy with a running stitch of CV-6 suture.  At this point, then I completed the anastomosis in the usual fashion.  I released the vessel loops on the inflow and allowed the artery to decompress through the graft. There was good pulsatile bleeding through this graft.  I clamped the graft near its arterial anastomosis and sucked out all the blood in the graft and loaded the graft with heparinized saline.  At this point, I pulled the graft to appropriate length and reset my exposure of the axillary vein.  Then, I controlled the vein with Bulldog clamps.  An anterior wall venotomy was created with an 11 blade and extended with Potts scissors.  I then spatulated the graft to facilitate an  end-to- side anastomosis matching the arteriotomy.  In the process of spatulating, I cut the graft to appropriate length for this anastomosis.  This graft was sewn to the vein in an end-to-side configuration with a running CV-6 suture.  Prior to completing this anastomosis, I allowed the vein to back bleed and then I also allowed the artery to bleed in an  antegrade fashion.  I completed this anastomosis in the usual fashion, and then irrigated out the high bicipital exposure and then placed Surgicel and Evicel.  I then turned my attention back to the antecubitum.  There was pulsatile flow in the artery beyond the graft.   At this point, I washed out the antecubital incision. Surgicel and Evicel were then placed. There was no more active bleeding.  The subcutaneous tissue was reapproximated with a running stitch of 3-0 Vicryl.  The skin was then reapproximated with a running subcuticular 4-0 Monocryl.  The skin was then cleaned, dried, and Dermabond used to reinforce the skin closure.  We then turned our attention to the axillary incision.  The subcutaneous tissue was repaired with running stitch of 3-0 Vicryl.  The skin was then reapproximated with running subcuticular 4-0 Monocryl.  The skin was then cleaned, dried, and then the skin closure was reinforced with Dermabond.  The patient was then awakened from anesthesia and taken to the recovery room in stable condition having tolerated the procedure well.    COMPLICATIONS: None  CONDITION: Stable   Leotis Pain 02/24/2021 3:13 PM   This note was created with Dragon Medical transcription system. Any errors in dictation are purely unintentional.

## 2021-02-24 NOTE — Anesthesia Procedure Notes (Signed)
Anesthesia Regional Block: Supraclavicular block   Pre-Anesthetic Checklist: , timeout performed,  Correct Patient, Correct Site, Correct Laterality,  Correct Procedure, Correct Position, site marked,  Risks and benefits discussed,  Surgical consent,  Pre-op evaluation,  At surgeon's request and post-op pain management  Laterality: Upper and Left  Prep: chloraprep       Needles:  Injection technique: Single-shot  Needle Type: Echogenic Stimulator Needle     Needle Length: 5cm  Needle Gauge: 22   Needle insertion depth: 2 cm   Additional Needles:   Procedures:, nerve stimulator,,, ultrasound used (permanent image in chart),,     Nerve Stimulator or Paresthesia:  Response: L Forearm, 0.6 mA, 2 ms, 2 cm  Additional Responses:   Narrative:  Start time: 02/24/2021 12:29 PM End time: 02/24/2021 12:42 PM Injection made incrementally with aspirations every 5 mL.  Performed by: Personally  Anesthesiologist: Phill Mutter, MD  Additional Notes: Patient consented for risk and benefits of nerve block including but not limited to nerve damage, failed block, bleeding and infection.  Patient voiced understanding.  Functioning IV was confirmed and monitors were applied.  Timeout done prior to procedure and prior to any sedation being given to the patient.  Patient confirmed procedure site prior to any sedation given to the patient. IVCS with 1+1mg  Versed.  A 34mm 22ga Stimuplex needle was used. Sterile prep,hand hygiene and sterile gloves were used.  Minimal sedation used for procedure.  No paresthesia endorsed by patient during the procedure.  Negative aspiration and negative test dose prior to incremental administration of local anesthetic. The patient tolerated the procedure well with no immediate complications.  Injectate:  0.5% Ropivacaine 30 ml

## 2021-02-24 NOTE — Transfer of Care (Signed)
Immediate Anesthesia Transfer of Care Note  Patient: Brianna Dodson  Procedure(s) Performed: INSERTION OF ARTERIOVENOUS (AV) GORE-TEX GRAFT ARM (Left: Arm Upper)  Patient Location: PACU  Anesthesia Type:General  Level of Consciousness: drowsy  Airway & Oxygen Therapy: Patient Spontanous Breathing and Patient connected to face mask oxygen  Post-op Assessment: Report given to RN and Post -op Vital signs reviewed and stable  Post vital signs: Reviewed and stable  Last Vitals:  Vitals Value Taken Time  BP 132/54 02/24/21 1522  Temp    Pulse 57 02/24/21 1526  Resp 10 02/24/21 1526  SpO2 100 % 02/24/21 1526  Vitals shown include unvalidated device data.  Last Pain:  Vitals:   02/24/21 1158  TempSrc: Oral  PainSc: 0-No pain         Complications: No notable events documented.

## 2021-02-24 NOTE — Interval H&P Note (Signed)
History and Physical Interval Note:  02/24/2021 12:41 PM  Brianna Dodson  has presented today for surgery, with the diagnosis of ESRD N18.6.  The various methods of treatment have been discussed with the patient and family. After consideration of risks, benefits and other options for treatment, the patient has consented to  Procedure(s) with comments: INSERTION OF ARTERIOVENOUS (AV) GORE-TEX GRAFT ARM (Left) - Left brachial Ax graft as a surgical intervention.  The patient's history has been reviewed, patient examined, no change in status, stable for surgery.  I have reviewed the patient's chart and labs.  Questions were answered to the patient's satisfaction.   Heart RRR, no murmur Lungs CTAB  Leotis Pain

## 2021-02-25 ENCOUNTER — Observation Stay: Payer: Medicare Other

## 2021-02-25 ENCOUNTER — Encounter: Payer: Self-pay | Admitting: Emergency Medicine

## 2021-02-25 ENCOUNTER — Inpatient Hospital Stay
Admission: EM | Admit: 2021-02-25 | Discharge: 2021-03-03 | DRG: 252 | Disposition: A | Discharge status: Skilled Nursing Facility | Payer: Medicare Other | Attending: Internal Medicine | Admitting: Internal Medicine

## 2021-02-25 ENCOUNTER — Emergency Department: Payer: Medicare Other

## 2021-02-25 DIAGNOSIS — R29705 NIHSS score 5: Secondary | ICD-10-CM | POA: Diagnosis present

## 2021-02-25 DIAGNOSIS — M79622 Pain in left upper arm: Secondary | ICD-10-CM

## 2021-02-25 DIAGNOSIS — D631 Anemia in chronic kidney disease: Secondary | ICD-10-CM | POA: Diagnosis present

## 2021-02-25 DIAGNOSIS — I4891 Unspecified atrial fibrillation: Secondary | ICD-10-CM | POA: Diagnosis present

## 2021-02-25 DIAGNOSIS — I10 Essential (primary) hypertension: Secondary | ICD-10-CM | POA: Diagnosis not present

## 2021-02-25 DIAGNOSIS — E785 Hyperlipidemia, unspecified: Secondary | ICD-10-CM | POA: Diagnosis present

## 2021-02-25 DIAGNOSIS — I120 Hypertensive chronic kidney disease with stage 5 chronic kidney disease or end stage renal disease: Secondary | ICD-10-CM | POA: Diagnosis present

## 2021-02-25 DIAGNOSIS — R600 Localized edema: Secondary | ICD-10-CM | POA: Diagnosis not present

## 2021-02-25 DIAGNOSIS — I6523 Occlusion and stenosis of bilateral carotid arteries: Secondary | ICD-10-CM | POA: Diagnosis not present

## 2021-02-25 DIAGNOSIS — G9389 Other specified disorders of brain: Secondary | ICD-10-CM | POA: Diagnosis not present

## 2021-02-25 DIAGNOSIS — Z833 Family history of diabetes mellitus: Secondary | ICD-10-CM

## 2021-02-25 DIAGNOSIS — I6782 Cerebral ischemia: Secondary | ICD-10-CM | POA: Diagnosis not present

## 2021-02-25 DIAGNOSIS — G309 Alzheimer's disease, unspecified: Secondary | ICD-10-CM | POA: Diagnosis present

## 2021-02-25 DIAGNOSIS — N186 End stage renal disease: Secondary | ICD-10-CM

## 2021-02-25 DIAGNOSIS — D696 Thrombocytopenia, unspecified: Secondary | ICD-10-CM | POA: Diagnosis present

## 2021-02-25 DIAGNOSIS — M79602 Pain in left arm: Secondary | ICD-10-CM | POA: Diagnosis not present

## 2021-02-25 DIAGNOSIS — Z992 Dependence on renal dialysis: Secondary | ICD-10-CM

## 2021-02-25 DIAGNOSIS — Z7984 Long term (current) use of oral hypoglycemic drugs: Secondary | ICD-10-CM

## 2021-02-25 DIAGNOSIS — N2581 Secondary hyperparathyroidism of renal origin: Secondary | ICD-10-CM | POA: Diagnosis present

## 2021-02-25 DIAGNOSIS — Z79899 Other long term (current) drug therapy: Secondary | ICD-10-CM

## 2021-02-25 DIAGNOSIS — Z90710 Acquired absence of both cervix and uterus: Secondary | ICD-10-CM

## 2021-02-25 DIAGNOSIS — R42 Dizziness and giddiness: Secondary | ICD-10-CM | POA: Diagnosis not present

## 2021-02-25 DIAGNOSIS — I639 Cerebral infarction, unspecified: Secondary | ICD-10-CM | POA: Diagnosis not present

## 2021-02-25 DIAGNOSIS — Z20822 Contact with and (suspected) exposure to covid-19: Secondary | ICD-10-CM | POA: Diagnosis present

## 2021-02-25 DIAGNOSIS — E1129 Type 2 diabetes mellitus with other diabetic kidney complication: Secondary | ICD-10-CM | POA: Diagnosis present

## 2021-02-25 DIAGNOSIS — I63311 Cerebral infarction due to thrombosis of right middle cerebral artery: Secondary | ICD-10-CM | POA: Diagnosis not present

## 2021-02-25 DIAGNOSIS — R55 Syncope and collapse: Secondary | ICD-10-CM | POA: Diagnosis not present

## 2021-02-25 DIAGNOSIS — G8194 Hemiplegia, unspecified affecting left nondominant side: Secondary | ICD-10-CM | POA: Diagnosis present

## 2021-02-25 DIAGNOSIS — E1122 Type 2 diabetes mellitus with diabetic chronic kidney disease: Secondary | ICD-10-CM | POA: Diagnosis present

## 2021-02-25 DIAGNOSIS — I770 Arteriovenous fistula, acquired: Secondary | ICD-10-CM | POA: Diagnosis not present

## 2021-02-25 DIAGNOSIS — T829XXA Unspecified complication of cardiac and vascular prosthetic device, implant and graft, initial encounter: Secondary | ICD-10-CM

## 2021-02-25 DIAGNOSIS — E119 Type 2 diabetes mellitus without complications: Secondary | ICD-10-CM | POA: Diagnosis not present

## 2021-02-25 DIAGNOSIS — D6959 Other secondary thrombocytopenia: Secondary | ICD-10-CM | POA: Diagnosis present

## 2021-02-25 DIAGNOSIS — F0280 Dementia in other diseases classified elsewhere without behavioral disturbance: Secondary | ICD-10-CM | POA: Diagnosis present

## 2021-02-25 DIAGNOSIS — I480 Paroxysmal atrial fibrillation: Secondary | ICD-10-CM | POA: Diagnosis present

## 2021-02-25 DIAGNOSIS — I63231 Cerebral infarction due to unspecified occlusion or stenosis of right carotid arteries: Secondary | ICD-10-CM | POA: Diagnosis not present

## 2021-02-25 DIAGNOSIS — T82898A Other specified complication of vascular prosthetic devices, implants and grafts, initial encounter: Principal | ICD-10-CM | POA: Diagnosis present

## 2021-02-25 DIAGNOSIS — Z7901 Long term (current) use of anticoagulants: Secondary | ICD-10-CM

## 2021-02-25 DIAGNOSIS — I63511 Cerebral infarction due to unspecified occlusion or stenosis of right middle cerebral artery: Secondary | ICD-10-CM | POA: Diagnosis present

## 2021-02-25 DIAGNOSIS — Y832 Surgical operation with anastomosis, bypass or graft as the cause of abnormal reaction of the patient, or of later complication, without mention of misadventure at the time of the procedure: Secondary | ICD-10-CM | POA: Diagnosis present

## 2021-02-25 LAB — RESP PANEL BY RT-PCR (FLU A&B, COVID) ARPGX2
Influenza A by PCR: NEGATIVE
Influenza B by PCR: NEGATIVE
SARS Coronavirus 2 by RT PCR: NEGATIVE

## 2021-02-25 LAB — GLUCOSE, CAPILLARY: Glucose-Capillary: 128 mg/dL — ABNORMAL HIGH (ref 70–99)

## 2021-02-25 LAB — BASIC METABOLIC PANEL
Anion gap: 10 (ref 5–15)
BUN: 27 mg/dL — ABNORMAL HIGH (ref 8–23)
CO2: 32 mmol/L (ref 22–32)
Calcium: 9.2 mg/dL (ref 8.9–10.3)
Chloride: 97 mmol/L — ABNORMAL LOW (ref 98–111)
Creatinine, Ser: 3.4 mg/dL — ABNORMAL HIGH (ref 0.44–1.00)
GFR, Estimated: 13 mL/min — ABNORMAL LOW (ref >60)
Glucose, Bld: 225 mg/dL — ABNORMAL HIGH (ref 70–99)
Potassium: 3.6 mmol/L (ref 3.5–5.1)
Sodium: 139 mmol/L (ref 135–145)

## 2021-02-25 LAB — CBC WITH DIFFERENTIAL/PLATELET
Abs Immature Granulocytes: 0.02 10*3/uL (ref 0.00–0.07)
Basophils Absolute: 0 10*3/uL (ref 0.0–0.1)
Basophils Relative: 0 %
Eosinophils Absolute: 0 10*3/uL (ref 0.0–0.5)
Eosinophils Relative: 0 %
HCT: 29.7 % — ABNORMAL LOW (ref 36.0–46.0)
Hemoglobin: 10.5 g/dL — ABNORMAL LOW (ref 12.0–15.0)
Immature Granulocytes: 0 %
Lymphocytes Relative: 9 %
Lymphs Abs: 0.6 10*3/uL — ABNORMAL LOW (ref 0.7–4.0)
MCH: 31.2 pg (ref 26.0–34.0)
MCHC: 35.4 g/dL (ref 30.0–36.0)
MCV: 88.1 fL (ref 80.0–100.0)
Monocytes Absolute: 0.6 10*3/uL (ref 0.1–1.0)
Monocytes Relative: 9 %
Neutro Abs: 5.6 10*3/uL (ref 1.7–7.7)
Neutrophils Relative %: 82 %
Platelets: 115 10*3/uL — ABNORMAL LOW (ref 150–400)
RBC: 3.37 MIL/uL — ABNORMAL LOW (ref 3.87–5.11)
RDW: 13.5 % (ref 11.5–15.5)
WBC: 6.9 10*3/uL (ref 4.0–10.5)
nRBC: 0 % (ref 0.0–0.2)

## 2021-02-25 MED ORDER — INSULIN ASPART 100 UNIT/ML IJ SOLN
0.0000 [IU] | Freq: Three times a day (TID) | INTRAMUSCULAR | Status: DC
  Administered 2021-02-27: 3 [IU] via SUBCUTANEOUS
  Administered 2021-02-27: 11:00:00 1 [IU] via SUBCUTANEOUS
  Administered 2021-02-28: 2 [IU] via SUBCUTANEOUS
  Administered 2021-02-28 (×2): 1 [IU] via SUBCUTANEOUS
  Administered 2021-03-01 – 2021-03-02 (×2): 2 [IU] via SUBCUTANEOUS
  Administered 2021-03-02: 08:00:00 1 [IU] via SUBCUTANEOUS
  Administered 2021-03-02: 14:00:00 2 [IU] via SUBCUTANEOUS
  Administered 2021-03-03: 17:00:00 3 [IU] via SUBCUTANEOUS
  Administered 2021-03-03: 2 [IU] via SUBCUTANEOUS
  Filled 2021-02-25 (×12): qty 1

## 2021-02-25 MED ORDER — OXYCODONE-ACETAMINOPHEN 5-325 MG PO TABS
1.0000 | ORAL_TABLET | ORAL | Status: DC | PRN
  Administered 2021-02-25: 23:00:00 1 via ORAL
  Filled 2021-02-25: qty 1

## 2021-02-25 MED ORDER — MORPHINE SULFATE (PF) 2 MG/ML IV SOLN
0.5000 mg | INTRAVENOUS | Status: DC | PRN
  Administered 2021-02-25 (×2): 0.5 mg via INTRAVENOUS
  Filled 2021-02-25 (×2): qty 1

## 2021-02-25 MED ORDER — INSULIN ASPART 100 UNIT/ML IJ SOLN
0.0000 [IU] | Freq: Every day | INTRAMUSCULAR | Status: DC
  Administered 2021-02-26: 23:00:00 2 [IU] via SUBCUTANEOUS
  Filled 2021-02-25: qty 1

## 2021-02-25 MED ORDER — HEPARIN SODIUM (PORCINE) 1000 UNIT/ML DIALYSIS
1000.0000 [IU] | INTRAMUSCULAR | Status: DC | PRN
  Filled 2021-02-25: qty 1

## 2021-02-25 MED ORDER — ALTEPLASE 2 MG IJ SOLR
2.0000 mg | Freq: Once | INTRAMUSCULAR | Status: DC | PRN
  Filled 2021-02-25: qty 2

## 2021-02-25 MED ORDER — IOHEXOL 350 MG/ML SOLN
80.0000 mL | Freq: Once | INTRAVENOUS | Status: AC | PRN
  Administered 2021-02-25: 80 mL via INTRAVENOUS

## 2021-02-25 MED ORDER — PENTAFLUOROPROP-TETRAFLUOROETH EX AERO
1.0000 "application " | INHALATION_SPRAY | CUTANEOUS | Status: DC | PRN
  Filled 2021-02-25: qty 30

## 2021-02-25 MED ORDER — SODIUM CHLORIDE 0.9 % IV SOLN: 100.0000 mL | INTRAVENOUS | Status: DC | PRN

## 2021-02-25 MED ORDER — OXYCODONE HCL 5 MG PO TABS
5.0000 mg | ORAL_TABLET | Freq: Once | ORAL | Status: AC
  Administered 2021-02-25: 5 mg via ORAL
  Filled 2021-02-25: qty 1

## 2021-02-25 MED ORDER — OXYCODONE HCL 5 MG PO TABS
5.0000 mg | ORAL_TABLET | ORAL | Status: DC | PRN
  Administered 2021-02-25 (×2): 5 mg via ORAL
  Filled 2021-02-25 (×2): qty 1

## 2021-02-25 MED ORDER — ACETAMINOPHEN 325 MG PO TABS
650.0000 mg | ORAL_TABLET | Freq: Four times a day (QID) | ORAL | Status: DC | PRN
  Administered 2021-02-27: 11:00:00 650 mg via ORAL
  Filled 2021-02-25: qty 2

## 2021-02-25 MED ORDER — STROKE: EARLY STAGES OF RECOVERY BOOK: Freq: Once | Status: AC

## 2021-02-25 MED ORDER — LIDOCAINE HCL (PF) 1 % IJ SOLN
5.0000 mL | INTRAMUSCULAR | Status: DC | PRN
  Filled 2021-02-25: qty 5

## 2021-02-25 MED ORDER — HYDROCODONE-ACETAMINOPHEN 7.5-325 MG PO TABS: 1.0000 | ORAL_TABLET | Freq: Four times a day (QID) | ORAL | Status: DC | PRN

## 2021-02-25 MED ORDER — LIDOCAINE-PRILOCAINE 2.5-2.5 % EX CREA
1.0000 "application " | TOPICAL_CREAM | CUTANEOUS | Status: DC | PRN
  Filled 2021-02-25: qty 5

## 2021-02-25 MED ORDER — CHLORHEXIDINE GLUCONATE CLOTH 2 % EX PADS
6.0000 | MEDICATED_PAD | Freq: Every day | CUTANEOUS | Status: DC
  Administered 2021-02-27 – 2021-03-02 (×4): 6 via TOPICAL
  Filled 2021-02-25: qty 6

## 2021-02-25 MED ORDER — HEPARIN SODIUM (PORCINE) 1000 UNIT/ML IJ SOLN
INTRAMUSCULAR | Status: AC
  Filled 2021-02-25: qty 1

## 2021-02-25 MED ORDER — SENNOSIDES-DOCUSATE SODIUM 8.6-50 MG PO TABS
1.0000 | ORAL_TABLET | Freq: Every evening | ORAL | Status: DC | PRN
  Administered 2021-03-01: 21:00:00 1 via ORAL
  Filled 2021-02-25: qty 1

## 2021-02-25 MED ORDER — GABAPENTIN 100 MG PO CAPS
100.0000 mg | ORAL_CAPSULE | Freq: Every day | ORAL | Status: DC
  Administered 2021-02-25 – 2021-03-02 (×6): 100 mg via ORAL
  Filled 2021-02-25 (×6): qty 1

## 2021-02-25 NOTE — ED Provider Notes (Signed)
[facility] Emergency Department Provider Note  ____________________________________________   Event Date/Time   First MD Initiated Contact with Patient 02/25/21 [phone]     (approximate)  I have reviewed the triage vital signs and the nursing notes.   HISTORY  Chief Complaint Arm Pain    HPI Brianna Dodson is a 87 y.o. female with A. fib on Eliquis, Alzheimer's, ESRD on dialysis Monday Wednesday Friday who comes in with concern for left arm throbbing since last night.  Patient reports having a surgery done yesterday by Dr. [name].  States that when she went home she initially was able to use her arm but she states that around 9 PM she stated that it felt like it had gone dead.  She is no longer able to flex or extend her hand or move any of her fingers.  This is been constant, nothing makes it better or worse.  She reports a throbbing sensation in her entire arm from the procedure.  Patient is supposed to have dialysis today at 1030.  Patient is currently getting dialysis through a right chest wall permacat.    On review of records patient underwent a left upper arm brachial artery to axillary vein AV graft by Dr. [name]             Past Medical History:  Diagnosis Date   A-fib ([place])    Alzheimer disease ([place])    Anemia    Carotid stenosis    Cerebral infarction ([place]) 04/12/2019   Multiple new foci of acute infarction affect the RIGHT hemisphere affecting the frontal, posterior frontal, posterior temporal, anterior parietal cortex and regional white matter.   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/2019   RIGHT MCA distal M1   Chronic anticoagulation    Apixaban   CKD (chronic kidney disease), stage 4(HCC)    Embolic stroke involving cerebral artery ([place]) 03/15/2019   RIGHT MCA/PCA territory   Encephalomalacia    RIGHT posterior MCA territory   HCV (hepatitis C virus)    History of blood transfusion     Hyperlipidemia    Hypertension    OSA (obstructive sleep apnea)    Status post placement of implantable loop recorder 07/30/2019   T2DM (type 2 diabetes mellitus) [facility])     Patient Active Problem List   Diagnosis Date Noted   Other disorders of phosphorus metabolism 01/12/2021   Cerebral infarction, unspecified ([place]) 10/12/2020   Coagulation defect, unspecified ([place]) 10/12/2020   Diarrhea, unspecified 10/12/2020   Encounter for immunization 10/12/2020   End stage renal disease on dialysis ([place]) 10/12/2020   Fever, unspecified 10/12/2020   Headache, unspecified 10/12/2020   Iron deficiency anemia, unspecified 10/12/2020   Localized edema 10/12/2020   Nontoxic single thyroid nodule 10/12/2020   Occlusion and stenosis of unspecified carotid artery 10/12/2020   Other fluid overload 10/12/2020   Pain, unspecified 10/12/2020   Shortness of breath 10/12/2020   Vascular dementia without behavioral disturbance ([place]) 10/12/2020   Malnutrition of moderate degree 09/13/2020   TIA (transient ischemic attack) 09/10/2020   Anemia of chronic disease 09/10/2020   CVA (cerebral vascular accident) ([place]) 09/10/2020   Sleeping difficulty 07/08/2020   Fatigue 06/09/2020   Mixed Alzheimer's and vascular dementia ([place]) 06/09/2020   Coccydynia 01/26/2020   Leukopenia 12/08/2019   Muscle strain 12/08/2019   Pedal edema 11/26/2019   Dysarthria and anarthria 11/07/2019   Atrial fibrillation ([place]) 10/21/2019   Nocturia 10/21/2019   Seborrheic keratosis  10/21/2019   Carotid stenosis 08/27/2019   Thyroid nodule 08/27/2019   Status post placement of implantable loop recorder 08/07/2019   Anemia 04/18/2019   Aphasia 04/12/2019   Benign hypertensive renal disease 04/07/2019   Hyperparathyroidism due to renal insufficiency ([place]) 04/07/2019   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, or unspecified 04/07/2019   Proteinuria 04/07/2019   History of stroke  with current residual effects 03/15/2019   Hyperlipidemia associated with type 2 diabetes mellitus ([place]) 07/25/2018   Itching 10/23/2017   Nevus 04/17/2017   CKD (chronic kidney disease), stage V ([place]) 02/02/2015   Vitamin D deficiency 02/02/2015   Obesity (BMI 30-39.9) 08/03/2014   DM type 2 (diabetes mellitus, type 2) ([place]) 01/29/2014   Essential hypertension 01/13/2014   HLD (hyperlipidemia) 01/13/2014    Past Surgical History:  Procedure Laterality Date   ABDOMINAL HYSTERECTOMY  07/04/1983   AV FISTULA PLACEMENT Left 12/02/2020   Procedure: INSERTION OF ARTERIOVENOUS (AV)  FISTULA  ARM ( BRACHIO- CEPHALIC);  Surgeon: Algernon Huxley, MD;  Location: [facility] ORS;  Service: Vascular;  Laterality: Left;   CATARACT EXTRACTION, BILATERAL Bilateral    DIALYSIS/PERMA CATHETER INSERTION N/A 10/06/2020   Procedure: DIALYSIS/PERMA CATHETER INSERTION;  Surgeon: Algernon Huxley, MD;  Location: [place] CV LAB;  Service: Cardiovascular;  Laterality: N/A;   LOOP RECORDER INSERTION N/A 07/30/2019   Procedure: LOOP RECORDER INSERTION;  Surgeon: Isaias Cowman, MD;  Location: [place] CV LAB;  Service: Cardiovascular;  Laterality: N/A;   TEE WITHOUT CARDIOVERSION N/A 04/14/2019   Procedure: TRANSESOPHAGEAL ECHOCARDIOGRAM (TEE);  Surgeon: Teodoro Spray, MD;  Location: [facility] ORS;  Service: Cardiovascular;  Laterality: N/A;    Prior to Admission medications   Medication Sig Start Date End Date Taking? Authorizing Provider  amLODipine (NORVASC) 10 MG tablet Take 0.5 tablets (5 mg total) by mouth daily. 01/25/21   [name], MD  atorvastatin (LIPITOR) 80 MG tablet Take 1 tablet (80 mg total) by mouth daily. 10/14/20   [name], MD  carvedilol (COREG) 6.25 MG tablet TAKE 1 TABLET (6.25 MG TOTAL) BY MOUTH 2 (TWO) TIMES DAILY WITH A MEAL. 10/07/20   [name], MD  ELIQUIS 2.5 MG TABS tablet Take 1 tablet (2.5 mg total) by mouth 2 (two) times daily. 10/07/20   [name], MD  ezetimibe (ZETIA) 10 MG tablet TAKE 1 TABLET BY MOUTH EVERY DAY 12/06/20   [name], MD  Ferrous Sulfate (IRON) 28 MG TABS Take 1 tablet by mouth daily in the afternoon.    [provider]  glucose blood (ACCU-CHEK GUIDE) test strip USE TO CHECK BLOOD SUGARS TWICE DAILY. E11.9 12/27/20   [name], MD  hydrALAZINE (APRESOLINE) 50 MG tablet Take 50 mg by mouth 2 (two) times daily.    [provider]  HYDROcodone-acetaminophen (NORCO) 5-325 MG tablet Take 1 tablet by mouth every 6 (six) hours as needed for moderate pain. 02/24/21   Algernon Huxley, MD  Lancets [facility] ULTRASOFT) lancets Use as instructed. Use patient insurance preferred brand. 12/27/20   [name], MD  linagliptin (TRADJENTA) 5 MG TABS tablet Take 1 tablet (5 mg total) by mouth daily. 12/23/20   [name], MD  mirtazapine (REMERON) 7.5 MG tablet Take 7.5 mg by mouth at bedtime. 01/10/21   [provider]  Vitamin D, Ergocalciferol, (DRISDOL) 50000 units CAPS capsule Take 50,000 Units by mouth every 30 (thirty) days. 10/05/17  [provider]    Allergies Nsaids  Family History  Problem Relation Age of Onset   Stroke Mother    Diabetes Mother    Heart disease Father    Kidney disease Sister    Diabetes Sister    Kidney disease Brother    Heart disease Sister    Diabetes Sister    Diabetes Sister    Diabetes Sister    Kidney disease Sister    Heart disease Sister    Kidney disease Brother        kidney transplant   Early death Brother 54       Truck Accident - died   Heart disease Brother     Social History Social History   Tobacco Use   Smoking status: Never   Smokeless tobacco: Never  Vaping Use   Vaping Use: Never used  Substance Use Topics   Alcohol use: No   Drug use: No      Review of Systems Constitutional: No fever/chills Eyes: No visual changes. ENT: No sore throat. Cardiovascular: Denies chest pain. Respiratory: Denies  shortness of breath. Gastrointestinal: No abdominal pain.  No nausea, no vomiting.  No diarrhea.  No constipation. Genitourinary: Negative for dysuria. Musculoskeletal: left arm weakness/pain  Skin: Negative for rash. Neurological: Negative for headaches, focal weakness or numbness. All other ROS negative ____________________________________________   PHYSICAL EXAM:  VITAL SIGNS: ED Triage Vitals  Enc Vitals Group     BP --      Pulse --      Resp --      Temp --      Temp src --      SpO2 --      Weight 02/25/21 0816 143 lb 15.4 oz (65.3 kg)     Height 02/25/21 0816 5\' 5"  (1.651 m)     Head Circumference --      Peak Flow --      Pain Score 02/25/21 0815 8     Pain Loc --      Pain Edu? --      Excl. in [place]? --     Constitutional: Alert and oriented. Well appearing and in no acute distress. Eyes: Conjunctivae are normal. EOMI. Head: Atraumatic. Nose: No congestion/rhinnorhea. Mouth/Throat: Mucous membranes are moist.   Neck: No stridor. Trachea Midline. FROM Cardiovascular: Normal rate, regular rhythm. Grossly normal heart sounds.  Good peripheral circulation.  Right-sided chest wall PermCath Respiratory: Normal respiratory effort.  No retractions. Lungs CTAB. Gastrointestinal: Soft and nontender. No distention. No abdominal bruits.  Musculoskeletal: sensation intact in the hand- but unable to flex or extend the left wrist. Unable to move any fingers- able to flex and extend elbow and the shoulder.  Warm well perfused- difficult to feel pulses so confirmed pt does have radial and ulnar pulses intact with doppler.  Neurologic:  Normal speech and language. Other then the left wrist drop- no other neuro deficits noted.  CN 2-12 intact. Equal strength in legs.  Skin:  Skin is warm, dry and intact. No rash noted. Psychiatric: Mood and affect are normal. Speech and behavior are normal. GU: Deferred   ____________________________________________   LABS (all labs ordered are  listed, but only abnormal results are displayed)  Labs Reviewed  CBC WITH DIFFERENTIAL/PLATELET  BASIC METABOLIC PANEL   ____________________________________________   RADIOLOGY   Official radiology report(s): [place] OR NERVE BLOCK-IMAGE ONLY [facility])  Result Date: 02/24/2021 There is no interpretation for this exam.  This order is for  images obtained during a surgical procedure.  Please See "Surgeries" Tab for more information regarding the procedure.    ____________________________________________   PROCEDURES  Procedure(s) performed (including Critical Care):  Procedures   ____________________________________________   INITIAL IMPRESSION / ASSESSMENT AND PLAN / ED COURSE  Brianna Dodson was evaluated in Emergency Department on 02/25/2021 for the symptoms described in the history of present illness. She was evaluated in the context of the global COVID-19 pandemic, which necessitated consideration that the patient might be at risk for infection with the SARS-CoV-2 virus that causes COVID-19. Institutional protocols and algorithms that pertain to the evaluation of patients at risk for COVID-19 are in a state of rapid change based on information released by regulatory bodies including the CDC and federal and state organizations. These policies and algorithms were followed during the patient's care in the ED.    Patient comes in with left arm weakness and pain.  This is from the elbow down.  I doubt it is a central cause such as stroke given her history we will get CT head evaluate for intercranial hemorrhage.  Did discuss the case with Dr. [name] who did her procedure.  Given patient's dementia is truly clear if she was ever able to use the hand after the surgery or she just started to notice that it was not working until later.  He states that they do get a nerve block for the surgery.  He stated that it could be some steal syndrome but it is hard to get the ultrasounds here to evaluate for  that.  He recommended that we do a CTA to see there is any evidence of severe disease.  I am able to Doppler her pulses at this time she is vascularly intact.  I do not see any signs of warmth or erythema to suggest an infection such as cellulitis or abscess.  Discussed with Dr. Juleen China given patient's was not sure how long she has been on dialysis and he was okay with [place] going ahead with contrast given it had been over 6 months.  They are also going to work on trying to get her dialysis scheduled for later  CT angio was reassuring other than some edema.  There is no signs of cellulitis on exam and Dr. [name] stated this is just normal postop otherwise it was overall reassuring.  CT head was negative other than her old chronic infarct so therefore will get MRI just to ensure no evidence of stroke.  Again though it is just from the elbow down suspect that this more likely a peripheral nerve that a central cause  MRI concerning for potential new acute stroke and old stroke area.  Vascular also asked if we could admit patient for observation regardless.  Given the concerning for potential new stroke not quite sure if this is actually was causing her pain and weakness but I think monitoring for steal syndrome is also reasonable therefore will admit to the hospital team for further work-up and management        ____________________________________________   FINAL CLINICAL IMPRESSION(S) / ED DIAGNOSES   Final diagnoses:  Cerebrovascular accident (CVA), unspecified mechanism ([place])  Pain of left upper arm      MEDICATIONS GIVEN DURING THIS VISIT:  Medications  oxyCODONE (Oxy IR/ROXICODONE) immediate release tablet 5 mg (has no administration in time range)  gabapentin (NEURONTIN) capsule 100 mg (has no administration in time range)  morphine 2 MG/ML injection 0.5 mg (has no administration in time range)  oxyCODONE-acetaminophen (PERCOCET/ROXICET) 5-325 MG per tablet 1 tablet (has no  administration in time range)  acetaminophen (TYLENOL) tablet 650 mg (has no administration in time range)  oxyCODONE (Oxy IR/ROXICODONE) immediate release tablet 5 mg (5 mg Oral Given 02/25/21 0930)  iohexol (OMNIPAQUE) 350 MG/ML injection 80 mL (80 mLs Intravenous Contrast Given 02/25/21 0943)     ED Discharge Orders     None        Note:  This document was prepared using Dragon voice recognition software and may include unintentional dictation errors.    Vanessa [place], MD 02/25/21 [phone]

## 2021-02-25 NOTE — ED Notes (Addendum)
Dialysis called pt in dialysis co of pain, pt received oxycodone last 1 hour  see mar for time , not due for additional oxycodone at this time   , prn morphine due however due to patient not being able to be monitored pt in ed unit  previous desaturation in pts oxygen from last 0.5 mg ivp morphine ed rn feels that it is not appropriate at this time to administer.

## 2021-02-25 NOTE — Progress Notes (Signed)
[facility]  ROUNDING NOTE   Subjective:   Brianna Dodson is a 87 y.o. female with past medical history of atrial fib, anemia, hypertension, hyperlipidemia, diabetes, stroke, and ESRD on dialysis. Patient presents to ED with complaints of left arm pain since previous night.   Patient is known to our practice and receives treatment at [facility], supervised by Dr Juleen China. States her last dialysis treatment was Wednesday. She received a full treatment with no complications. She now states her left arm has been throbbing since last night. Denies nausea, vomiting or diarrhea. Denies shortness of breath and cough. MRI of the brain shows acute ischemia within right parietal lobe. We have been consulted to manage dialysis needs during dialysis.   Objective:  Vital signs in last 24 hours:  Temp:  [96.8 F (36 C)-98.3 F (36.8 C)] 98.3 F (36.8 C) (08/26 0817) Pulse Rate:  [56-87] 81 (08/26 1255) Resp:  [11-21] 17 (08/26 1128) BP: (132-155)/(51-98) 151/77 (08/26 1128) SpO2:  [97 %-100 %] 100 % (08/26 1255) Weight:  [65.3 kg] 65.3 kg (08/26 0816)  Weight change:  Filed Weights   02/25/21 0816  Weight: 65.3 kg    Intake/Output: No intake/output data recorded.   Intake/Output this shift:  No intake/output data recorded.  Physical Exam: General: NAD, laying on stretcher  Head: Normocephalic, atraumatic. Moist oral mucosal membranes  Eyes: Anicteric  Lungs:  Clear to auscultation, normal effort  Heart: Regular rate and rhythm  Abdomen:  Soft, nontender  Extremities:  no peripheral edema.   Neurologic: Nonfocal, moving all four extremities  Skin: No lesions  Access: Rt permcath, LUE AVF (nonmaturing)    Basic Metabolic Panel: Recent Labs  Lab 02/24/21 1153 02/25/21 0841  NA 141 139  K 3.6 3.6  CL 97* 97*  CO2  --  32  GLUCOSE 156* 225*  BUN 20 27*  CREATININE 3.00* 3.40*  CALCIUM  --  9.2    Liver Function Tests: No results for input(s): AST, ALT,  ALKPHOS, BILITOT, PROT, ALBUMIN in the last 168 hours. No results for input(s): LIPASE, AMYLASE in the last 168 hours. No results for input(s): AMMONIA in the last 168 hours.  CBC: Recent Labs  Lab 02/24/21 1141 02/24/21 1153 02/25/21 0841  WBC 2.8*  --  6.9  NEUTROABS  --   --  5.6  HGB 10.0* 9.2* 10.5*  HCT 28.2* 27.0* 29.7*  MCV 88.4  --  88.1  PLT 111*  --  115*    Cardiac Enzymes: No results for input(s): CKTOTAL, CKMB, CKMBINDEX, TROPONINI in the last 168 hours.  BNP: Invalid input(s): POCBNP  CBG: Recent Labs  Lab 02/24/21 [place]*    Microbiology: Results for orders placed or performed during the hospital encounter of 02/25/21  Resp Panel by RT-PCR (Flu A&B, Covid) Nasopharyngeal Swab     Status: None   Collection Time: 02/25/21 12:01 PM   Specimen: Nasopharyngeal Swab; Nasopharyngeal(NP) swabs in vial transport medium  Result Value Ref Range Status   SARS Coronavirus 2 by RT PCR NEGATIVE NEGATIVE Final    Comment: (NOTE) SARS-CoV-2 target nucleic acids are NOT DETECTED.  The SARS-CoV-2 RNA is generally detectable in upper respiratory specimens during the acute phase of infection. The lowest concentration of SARS-CoV-2 viral copies this assay can detect is 138 copies/mL. A negative result does not preclude SARS-Cov-2 infection and should not be used as the sole basis for treatment or other patient management decisions. A negative result may occur with  improper specimen collection/handling, submission of specimen other than nasopharyngeal swab, presence of viral mutation(s) within the areas targeted by this assay, and inadequate number of viral copies(<138 copies/mL). A negative result must be combined with clinical observations, patient history, and epidemiological information. The expected result is Negative.  Fact Sheet for Patients:  EntrepreneurPulse.com.au  Fact Sheet for Healthcare Providers:   IncredibleEmployment.be  This test is no t yet approved or cleared by the [place] FDA and  has been authorized for detection and/or diagnosis of SARS-CoV-2 by FDA under an Emergency Use Authorization (EUA). This EUA will remain  in effect (meaning this test can be used) for the duration of the COVID-19 declaration under Section 564(b)(1) of the Act, 21 U.S.C.section 360bbb-3(b)(1), unless the authorization is terminated  or revoked sooner.       Influenza A by PCR NEGATIVE NEGATIVE Final   Influenza B by PCR NEGATIVE NEGATIVE Final    Comment: (NOTE) The Xpert Xpress SARS-CoV-2/FLU/RSV plus assay is intended as an aid in the diagnosis of influenza from Nasopharyngeal swab specimens and should not be used as a sole basis for treatment. Nasal washings and aspirates are unacceptable for Xpert Xpress SARS-CoV-2/FLU/RSV testing.  Fact Sheet for Patients: EntrepreneurPulse.com.au  Fact Sheet for Healthcare Providers: IncredibleEmployment.be  This test is not yet approved or cleared by the [place] FDA and has been authorized for detection and/or diagnosis of SARS-CoV-2 by FDA under an Emergency Use Authorization (EUA). This EUA will remain in effect (meaning this test can be used) for the duration of the COVID-19 declaration under Section 564(b)(1) of the Act, 21 U.S.C. section 360bbb-3(b)(1), unless the authorization is terminated or revoked.  Performed at [facility], [address] [place], [place] [address]     Coagulation Studies: No results for input(s): LABPROT, INR in the last 72 hours.  Urinalysis: No results for input(s): COLORURINE, LABSPEC, PHURINE, GLUCOSEU, HGBUR, BILIRUBINUR, KETONESUR, PROTEINUR, UROBILINOGEN, NITRITE, LEUKOCYTESUR in the last 72 hours.  Invalid input(s): APPERANCEUR    Imaging: CT HEAD WO CONTRAST (5MM)  Result Date: 02/25/2021 CLINICAL DATA:  87 year old  female with dizziness. Acute on chronic right MCA infarct in March. Dialysis patient with pain in the region of left arm AV fistula. EXAM: CT HEAD WITHOUT CONTRAST TECHNIQUE: Contiguous axial images were obtained from the base of the skull through the vertex without intravenous contrast. COMPARISON:  Brain MRI 09/12/2020.  Head CT 09/10/2020. FINDINGS: Brain: Right MCA territory encephalomalacia. Mild ex vacuo enlargement of the ventricles. Patchy and confluent additional bilateral cerebral white matter hypodensity. No midline shift, ventriculomegaly, mass effect, evidence of mass lesion, intracranial hemorrhage or evidence of cortically based acute infarction. Vascular: Extensive Calcified atherosclerosis at the skull base. Right MCA calcified atherosclerosis. No suspicious intracranial vascular hyperdensity. Skull: No acute osseous abnormality identified. Sinuses/Orbits: Continued bubbly opacity in the right sphenoid sinus. Other Visualized paranasal sinuses and mastoids are clear. Other: No acute orbit or scalp soft tissue finding. IMPRESSION: 1. Chronic right MCA territory infarct and advanced chronic small vessel disease. 2. No acute intracranial abnormality. Electronically Signed   By: Genevie Ann M.D.   On: 02/25/2021 10:16   CT ANGIO UP EXTREM LEFT W &/OR WO CONTAST  Result Date: 02/25/2021 CLINICAL DATA:  87 year old female with a murmur at the site of AV fistula EXAM: CT ANGIOGRAPHY UPPER LEFT EXTREMITY TECHNIQUE: Axial spiral CT images were acquired of the left upper extremity after administration of a standard IV contrast bolus. The images were acquired with timing specific for the  arterial system. Axial and coronal images were performed on a separate workstation CONTRAST:  41mL OMNIPAQUE IOHEXOL 350 MG/ML SOLN COMPARISON:  None. FINDINGS: VASCULAR: Axillary artery is patent with mild atherosclerosis and no high-grade stenosis or occlusion. The humeral circumflex branches remain patent. Brachial artery  is patent without significant atherosclerosis throughout the upper arm. Surgical changes of arteriovenous fistula/dialysis circuit of the upper arm. The anastomosis is patent, with patent venous outflow through the brachial vein, axillary vein, and distal subclavian vein. Distal to the anastomosis the brachial artery remains patent to the antecubital region. Radial artery and the ulnar artery are patent to the distal forearm. There is decreased attenuation distally, which may be secondary to late arrival of the contrast bolus. The radial artery is dominant compared to the ulnar artery. A small interosseous artery is present proximally, without opacification distally. Incidentally imaged left common femoral artery appears patent as well as the proximal profunda femoris and SFA. The pelvis is not imaged. Nonvascular: Gas within the soft tissues of the left upper arm adjacent to the fistula. No pooling of contrast or extravasation of contrast. Edema within the fat planes adjacent to the musculature of the upper arm without fluid collection. Degenerative changes of the glenohumeral joint, acromioclavicular joint. No acute displaced fracture. Review of the MIP images confirms the above findings. IMPRESSION: CT angiogram demonstrates a patent left upper extremity arteriovenous dialysis circuit. If there is concern for hemodynamic assessment, directed duplex is a more sensitive and specific test, and should be considered if there is a suspicion of flow abnormality. Gas and edema within the soft tissues of the left upper arm adjacent to the dialysis circuit. While this is presumably related to usage/cannulation, soft tissue infection/cellulitis can not be excluded on CT. Signed, Dulcy Fanny. Dellia Nims, RPVI [facility] Electronically Signed   By: Corrie Mckusick D.O.   On: 02/25/2021 10:17   MR BRAIN WO CONTRAST  Result Date: 02/25/2021 CLINICAL DATA:  Stroke  suspected EXAM: MRI HEAD WITHOUT CONTRAST TECHNIQUE: Multiplanar, multiecho pulse sequences of the brain and surrounding structures were obtained without intravenous contrast. COMPARISON:  09/12/2020 MRI, correlation is also made with CT head 02/25/2021. FINDINGS: Evaluation somewhat limited by motion artifact. Brain: Small area of cortical restricted diffusion in the right parietal lobe (series 5, images 33-40), within area of encephalomalacia from prior right MCA territory infarct. No acute hemorrhage, mass, mass effect, or midline shift. No extra-axial fluid collection. Redemonstrated chronic microhemorrhages in the bilateral deep gray nuclei. Confluent T2 hyperintense signal in the periventricular white matter and pons, likely the sequela of here chronic small vessel ischemic disease. Unchanged ex vacuo dilatation of the right lateral ventricle. Vascular: 08/21/2019 Skull and upper cervical spine: Normal marrow signal. Sinuses/Orbits: Status post bilateral lens replacements. Otherwise negative. Other: Trace fluid in right mastoid air cells. IMPRESSION: Evaluation is somewhat limited by motion artifact. Within this limitation, there is acute ischemia within an area of remote infarct in the right parietal lobe. No evidence of hemorrhagic transformation. These results were called by telephone at the time of interpretation on 02/25/2021 at 8:16 am to provider [facility] , who verbally acknowledged these results. Electronically Signed   By: Merilyn Baba M.D.   On: 02/25/2021 11:18   [place] OR NERVE BLOCK-IMAGE ONLY [facility])  Result Date: 02/24/2021 There is no interpretation for this exam.  This order is for images obtained during a surgical procedure.  Please See "Surgeries" Tab for more information regarding the procedure.  Medications:     [START ON 02/26/2021] Chlorhexidine Gluconate Cloth  6 each Topical Q0600   gabapentin  100 mg Oral QHS   insulin aspart  0-5 Units Subcutaneous QHS   insulin aspart   0-9 Units Subcutaneous TID WC   acetaminophen, morphine injection, oxyCODONE, oxyCODONE-acetaminophen  Assessment/ Plan:  Brianna Dodson is a 87 y.o.  female with past medical history of atrial fib, anemia, hypertension, hyperlipidemia, diabetes, stroke, and ESRD on dialysis. Patient presents to ED with complaints of left arm pain since previous night.   [facility] [address]/MWF/Rt Permcath  Hypertension  Home regimen includes amlodipine, carvedilol, and hydralazine. All held at this time  2. End stage renal disease on dialysis Will maintain outpatient schedule if possible Last treatment on Wednesday Scheduled for dialysis today, no UF.  Next treatment on Monday  3. Anemia of chronic kidney disease Lab Results  Component Value Date   HGB 10.5 (L) 02/25/2021  Hgb at goal  4. Secondary Hyperparathyroidism:  Lab Results  Component Value Date   PTH 83 (H) 01/06/2016   CALCIUM 9.2 02/25/2021   CAION 1.04 (L) 02/24/2021   PHOS 3.7 08/21/2019     Phosphorus and calcium at goal    LOS: 0   8/26/20222:13 PM

## 2021-02-25 NOTE — Consult Note (Signed)
Neurology Consultation  Reason for Consult: Left arm weakness and pain Referring Physician: Dr. Blaine Hamper  CC: Left arm weakness and pain  History is obtained from: Patient, sister, chart  HPI: Brianna Dodson is a 87 y.o. 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  She said that she had the AV fistula surgery yesterday, went home and was initially able to use her arm some but then around 9 PM it started to feel like that the whole arm had gone dead.  She has extremely weak grip strength.  She was also supposed to have dialysis today through her chest wall permacath, which is probably what happened in the hospital now. Brain imaging was done due to prior history of stroke and new onset of weakness although she was outside the window for tPA.  MRI of the brain showed a possible acute infarction in the area of chronic right MCA infarct, and neurological consultation was obtained. I reviewed the images personally-in my opinion, her MRI could reflect a small cortical infarct in the setting of being off of anticoagulation and worsening of the symptoms on the already weak left side versus cortical laminar necrosis.  Her main complaint is extreme pain and diminished range of motion and not weakness and most of the history taking that I have obtained from her.   LKW: 9 PM yesterday tpa given?: no, outside the window Premorbid modified Rankin scale (mRS): 3  ROS: Full ROS was performed and is negative except as noted in the HPI.  Past Medical History:  Diagnosis Date   A-fib ([place])    Alzheimer disease ([place])    Anemia    Carotid stenosis    Cerebral infarction ([place])  04/12/2019   Multiple new foci of acute infarction affect the RIGHT hemisphere affecting the frontal, posterior frontal, posterior temporal, anterior parietal cortex and regional white matter.   Cerebral infarction due to embolism of right middle cerebral artery ([place]) 08/20/2019   RIGHT MCA distal M1   Chronic anticoagulation    Apixaban   CKD (chronic kidney disease), stage 4(H[facility])    Embolic stroke involving cerebral artery ([place]) 03/15/2019   RIGHT MCA/PCA territory   Encephalomalacia    RIGHT posterior MCA territory   HCV (hepatitis C virus)    History of blood transfusion    Hyperlipidemia    Hypertension    OSA (obstructive sleep apnea)    Status post placement of implantable loop recorder 07/30/2019   T2DM (type 2 diabetes mellitus) ([place])      Family History  Problem Relation Age of Onset   Stroke Mother    Diabetes Mother    Heart disease Father    Kidney disease Sister    Diabetes Sister    Kidney disease Brother    Heart disease Sister    Diabetes Sister    Diabetes Sister    Diabetes Sister    Kidney disease Sister    Heart disease Sister    Kidney disease Brother        kidney transplant   Early death Brother 47       Truck Accident - died   Heart disease Brother    Social History:   reports that  she has never smoked. She has never used smokeless tobacco. She reports that she does not drink alcohol and does not use drugs.  Medications  Current Facility-Administered Medications:    acetaminophen (TYLENOL) tablet 650 mg, 650 mg, Oral, Q6H PRN, Ivor Costa, MD   [START ON 02/26/2021] Chlorhexidine Gluconate Cloth 2 % PADS 6 each, 6 each, Topical, Q0600, Breeze, Shantelle, NP   gabapentin (NEURONTIN) capsule 100 mg, 100 mg, Oral, QHS, Stegmayer, Kimberly A, PA-C   insulin aspart (novoLOG) injection 0-5 Units, 0-5 Units, Subcutaneous, QHS, Ivor Costa, MD   insulin aspart (novoLOG) injection 0-9 Units, 0-9 Units, Subcutaneous, TID WC, Ivor Costa, MD   morphine 2  MG/ML injection 0.5 mg, 0.5 mg, Intravenous, Q3H PRN, Ivor Costa, MD, 0.5 mg at 02/25/21 1208   oxyCODONE (Oxy IR/ROXICODONE) immediate release tablet 5 mg, 5 mg, Oral, Q4H PRN, Stegmayer, Kimberly A, PA-C   oxyCODONE-acetaminophen (PERCOCET/ROXICET) 5-325 MG per tablet 1 tablet, 1 tablet, Oral, Q4H PRN, Ivor Costa, MD  Current Outpatient Medications:    amLODipine (NORVASC) 10 MG tablet, Take 0.5 tablets (5 mg total) by mouth daily., Disp: 45 tablet, Rfl: 1   atorvastatin (LIPITOR) 80 MG tablet, Take 1 tablet (80 mg total) by mouth daily., Disp: 90 tablet, Rfl: 3   carvedilol (COREG) 6.25 MG tablet, TAKE 1 TABLET (6.25 MG TOTAL) BY MOUTH 2 (TWO) TIMES DAILY WITH A MEAL., Disp: 180 tablet, Rfl: 1   ELIQUIS 2.5 MG TABS tablet, Take 1 tablet (2.5 mg total) by mouth 2 (two) times daily., Disp: 56 tablet, Rfl: 0   ezetimibe (ZETIA) 10 MG tablet, TAKE 1 TABLET BY MOUTH EVERY DAY, Disp: 90 tablet, Rfl: 3   Ferrous Sulfate (IRON) 28 MG TABS, Take 1 tablet by mouth daily in the afternoon., Disp: , Rfl:    glucose blood (ACCU-CHEK GUIDE) test strip, USE TO CHECK BLOOD SUGARS TWICE DAILY. E11.9, Disp: 300 each, Rfl: 3   hydrALAZINE (APRESOLINE) 50 MG tablet, Take 50 mg by mouth 2 (two) times daily., Disp: , Rfl:    HYDROcodone-acetaminophen (NORCO) 5-325 MG tablet, Take 1 tablet by mouth every 6 (six) hours as needed for moderate pain., Disp: 30 tablet, Rfl: 0   Lancets (ONETOUCH ULTRASOFT) lancets, Use as instructed. Use patient insurance preferred brand., Disp: 300 each, Rfl: 3   linagliptin (TRADJENTA) 5 MG TABS tablet, Take 1 tablet (5 mg total) by mouth daily., Disp: 90 tablet, Rfl: 3   mirtazapine (REMERON) 7.5 MG tablet, Take 7.5 mg by mouth at bedtime., Disp: , Rfl:    Vitamin D, Ergocalciferol, (DRISDOL) 50000 units CAPS capsule, Take 50,000 Units by mouth every 30 (thirty) days., Disp: , Rfl: 1   Exam: Current vital signs: BP (!) 151/77 (BP Location: Right Arm)   Pulse 81   Temp 98.3 F (36.8  C) (Oral)   Resp 17   Ht 5\' 5"  (1.651 m)   Wt 65.3 kg   SpO2 100%   BMI 23.96 kg/m  Vital signs in last 24 hours: Temp:  [96.8 F (36 C)-98.3 F (36.8 C)] 98.3 F (36.8 C) (08/26 0817) Pulse Rate:  [56-87] 81 (08/26 1255) Resp:  [11-21] 17 (08/26 1128) BP: (132-155)/(51-98) 151/77 (08/26 1128) SpO2:  [97 %-100 %] 100 % (08/26 1255) Weight:  [65.3 kg] 65.3 kg (08/26 0816) General: Awake alert in distress due to pain on the left arm HEENT: Normocephalic/atraumatic CVs: RRR Respiratory: Breathing well saturating normally on room air Extremities: Left upper extremity held closer to her body, incision on  the left upper arm warm dry intact but extremely tender to touch and the whole left upper extremity extremely tender to touch and with diminished range of motion. Neurological exam Awake alert oriented x2 set the month is September, year is 2022 and said her age is 87 years old when she is 87 years old. Speech is mild to moderately dysarthric Naming intact Comprehension intact Repetition intact Cranial nerves: Pupils equal round reactive to light, extraocular movements appear intact, subtle left facial weakness-unclear if its baseline, facial sensation intact. Motor examination with left upper extremity extremely tender to examined.  She has at least 4 -/5 left shoulder strength.  She is more weak distally in the hand on the left side with barely 2/5 strength which is different than her baseline she had recovered to at least a 4/5 strength there.  Right upper extremity is full strength. Left lower extremity 4+/5.  Right lower extremity 5/5  Sensation: Grossly intact Coordination with no dysmetria in the right upper, left lower and right lower extremity.  Did not perform with a left upper extremity NIH stroke scale 1a Level of Conscious.: 0 1b LOC Questions: 2 1c LOC Commands: 0 2 Best Gaze: 0 3 Visual: 0 4 Facial Palsy: 1 5a Motor Arm - left: 1 5b Motor Arm - Right: 0 6a Motor  Leg - Left: 0 6b Motor Leg - Right: 0 7 Limb Ataxia: 0 8 Sensory: 0 9 Best Language: 0 10 Dysarthria: 1 11 Extinct. and Inatten.: 0 TOTAL: 5    Labs I have reviewed labs in epic and the results pertinent to this consultation are: CBC    Component Value Date/Time   WBC 6.9 02/25/2021 0841   RBC 3.37 (L) 02/25/2021 0841   HGB 10.5 (L) 02/25/2021 0841   HGB 12.1 11/09/2013 1540   HCT 29.7 (L) 02/25/2021 0841   HCT 36.3 11/09/2013 1540   PLT 115 (L) 02/25/2021 0841   PLT 183 11/09/2013 1540   MCV 88.1 02/25/2021 0841   MCV 87 11/09/2013 1540   MCH 31.2 02/25/2021 0841   MCHC 35.4 02/25/2021 0841   RDW 13.5 02/25/2021 0841   RDW 14.0 11/09/2013 1540   LYMPHSABS 0.6 (L) 02/25/2021 0841   MONOABS 0.6 02/25/2021 0841   EOSABS 0.0 02/25/2021 0841   BASOSABS 0.0 02/25/2021 0841    CMP     Component Value Date/Time   NA 139 02/25/2021 0841   NA 142 11/10/2013 0522   K 3.6 02/25/2021 0841   K 4.6 11/10/2013 0522   CL 97 (L) 02/25/2021 0841   CL 110 (H) 11/10/2013 0522   CO2 32 02/25/2021 0841   CO2 27 11/10/2013 0522   GLUCOSE 225 (H) 02/25/2021 0841   GLUCOSE 70 11/10/2013 0522   BUN 27 (H) 02/25/2021 0841   BUN 22 (H) 11/10/2013 0522   CREATININE 3.40 (H) 02/25/2021 0841   CREATININE 1.12 11/10/2013 0522   CALCIUM 9.2 02/25/2021 0841   CALCIUM 8.4 (L) 11/10/2013 0522   PROT 7.2 06/09/2020 1145   PROT 9.0 (H) 11/09/2013 1540   ALBUMIN 3.9 06/09/2020 1145   ALBUMIN 3.8 11/09/2013 1540   AST 21 06/09/2020 1145   AST 29 11/09/2013 1540   ALT 20 06/09/2020 1145   ALT 27 11/09/2013 1540   ALKPHOS 48 06/09/2020 1145   ALKPHOS 59 11/09/2013 1540   BILITOT 0.6 06/09/2020 1145   BILITOT 0.2 11/09/2013 1540   GFRNONAA 13 (L) 02/25/2021 0841   GFRNONAA 47 (L) 11/10/2013 0522  GFRAA 27 (L) 08/22/2019 0449   GFRAA 54 (L) 11/10/2013 0522    Lipid Panel     Component Value Date/Time   CHOL 87 09/11/2020 0223   TRIG 95 09/11/2020 0223   HDL 38 (L) 09/11/2020 0223    CHOLHDL 2.3 09/11/2020 0223   VLDL 19 09/11/2020 0223   LDLCALC 30 09/11/2020 0223   LDLDIRECT 98.0 01/24/2018 0912   Carotid Dopplers from March 2021 with bilateral ICA is 1 to 39% stenosis, right vertebral artery with antegrade flow in left vertebral artery not visualized.  Subclavian showed normal flow hemodynamics in bilateral subclavian's at that time.  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  Assessment: 87 year old patient with above past medical history and multiple strokes with residual left-sided weakness with prior strokes most recently in March 2022 involving right MCA and right MCA ACA and right MCA PCA watershed territories, comes in for evaluation of throbbing pain in her left arm after an AV fistula creation by vascular surgery yesterday.  She is definitely weaker distally in that left arm from what I can tell based on her prior notes but her exam is extremely limited by pain. In my reading of the MRI, although it has been read as an acute stroke, the scanner changes are also commonly seen with progressive evolution of the stroke where there is cortical laminar necrosis which can appear with restricted diffusion and T1 hyperintensity in the affected cortex. Looking at her prior MRI from March, the area where the new stroke is read today had possibly already stroked out then making me not very confident to say that this is a new stroke in the same old area. Also her exam being limited by extreme pain points to a more local pathology than a central nervous  system pathology. Being off of anticoagulation periprocedurally, could have definitely put her at more risk for strokes, there is no doubt in that-but the imaging to me suggest cortical laminar necrosis and not new stroke.  In either case, for stroke prevention she is adequately treated with anticoagulation, which was held for the surgery and can be resumed whenever okay with vascular surgery-and anticoagulation will be the mainstay of stroke prevention in her.  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  Do not feel the need for repeat 2D echo, A1c or LDL. -Given predominant right-sided strokes, I would do a CT angio head and neck-she is a dialysis patient and this can be timed with her dialysis as needed. -She needs to be on anticoagulation-in the ER chance that this is a new stroke, it is small in size and I would recommend from a neurological standpoint that anticoagulation can be resumed within the next 2 days but I recommend checking with vascular surgery regarding initiation of anticoagulation from their standpoint. -I am in favor of continuing neurochecks and vascular checks to  ascertain whether this is something that might need readdressing in the OR-some sort of vascular steal phenomenon as suggested by primary consultation note from vascular surgery. I have relayed my plan to Dr. Blaine Hamper  -- Amie Portland, MD Neurologist [facility] Pager: [phone]

## 2021-02-25 NOTE — Addendum Note (Signed)
Addendum  created 02/25/21 0655 by Iran Ouch, MD   Review and Sign - Ready for Procedure

## 2021-02-25 NOTE — ED Notes (Signed)
John RN aware of assigned bed 

## 2021-02-25 NOTE — H&P (Signed)
30  History and Physical    Brianna Dodson HCW:[id number] DOB: [birth year] DOA: 02/25/2021  Referring MD/NP/PA:   PCP: Brianna Haven, MD   Patient coming from:  The patient is coming from home.  At baseline, pt is independent for most of ADL.        Chief Complaint: left arm pain and weakness  HPI: Brianna Dodson is a 87 y.o. female with medical history significant of ESRD-HD (MWF), hypertension, hyperlipidemia, diabetes mellitus,'s embolic stroke, atrial fibrillation on Eliquis, depression, OSA, HCV, anemia, Alzheimer's disease, who presents with left arm pain and weakness.  Pt had left arm AV graft placement procedure yesterday by Dr. Lucky Dodson of vascular surgery.  After she went home, she developed severe pain in the left arm, which is constant, sharp, throbbing-like, nonradiating.  Patient also reports weakness in left forearm, particularly in the left wrist and hand.  No decreased sensation.  No facial droop or slurred speech.  She denies chest pain, cough, shortness of breath.  Patient has nausea, but no vomiting, diarrhea or abdominal pain.  She states that she stopped taking Eliquis from 8/22-8/25 for surgical procedure.  She has not restarted Eliquis yet.  Patient last dialysis was on Wednesday, due for dialysis today.  ED Course: pt was found to have WBC 6.9, pending COVID-19 PCR, potassium 3.6, bicarbonate 32, creatinine 3.40, BUN 27, temperature normal, blood pressure 172/69, 151/77, heart rate 56, 87, RR 23, oxygen saturation 97% on room air.  CT head is negative for acute intracranial abnormalities.  MRI showed possible new stroke. Pt is placed in med-surg bed for obs. Dr. Lucky Dodson of VVS, Dr. Juleen Dodson of renal, Dr. Rory Dodson of neuro are consulted.   MRI-brain: Evaluation is somewhat limited by motion artifact. Within this limitation, there is acute ischemia within an area of remote infarct in the right parietal lobe. No evidence of hemorrhagic transformation.   CTA of left arm: CT  angiogram demonstrates a patent left upper extremity arteriovenous dialysis circuit. If there is concern for hemodynamic assessment, directed duplex is a more sensitive and specific test, and should be considered if there is a suspicion of flow abnormality.   Gas and edema within the soft tissues of the left upper arm adjacent to the dialysis circuit. While this is presumably related to usage/cannulation, soft tissue infection/cellulitis can not be excluded on CT.   Review of Systems:   General: no fevers, chills, no body weight gain, has fatigue HEENT: no blurry vision, hearing changes or sore throat Respiratory: no dyspnea, coughing, wheezing CV: no chest pain, no palpitations GI: no nausea, vomiting, abdominal pain, diarrhea, constipation GU: no dysuria, burning on urination, increased urinary frequency, hematuria  Ext: no leg edema Neuro: no vision change or hearing loss. Has pain and weakness in left arm Skin: no rash, no skin tear. MSK: No muscle spasm, no deformity, no limitation of range of movement in spin Heme: No easy bruising.  Travel history: No recent long distant travel.  Allergy:  Allergies  Allergen Reactions   Nsaids     CKD stage III - Avoid all nephrotoxic drugs    Past Medical History:  Diagnosis Date   A-fib ([place])    Alzheimer disease ([place])    Anemia    Carotid stenosis    Cerebral infarction ([place]) 04/12/2019   Multiple new foci of acute infarction affect the RIGHT hemisphere affecting the frontal, posterior frontal, posterior temporal, anterior parietal cortex and regional white matter.   Cerebral infarction due to embolism of  right middle cerebral artery ([place]) 08/20/2019   RIGHT MCA distal M1   Chronic anticoagulation    Apixaban   CKD (chronic kidney disease), stage 4(HCC)    Embolic stroke involving cerebral artery ([place]) 03/15/2019   RIGHT MCA/PCA territory   Encephalomalacia    RIGHT posterior MCA territory   HCV (hepatitis C virus)     History of blood transfusion    Hyperlipidemia    Hypertension    OSA (obstructive sleep apnea)    Status post placement of implantable loop recorder 07/30/2019   T2DM (type 2 diabetes mellitus) [facility])     Past Surgical History:  Procedure Laterality Date   ABDOMINAL HYSTERECTOMY  07/04/1983   AV FISTULA PLACEMENT Left 12/02/2020   Procedure: INSERTION OF ARTERIOVENOUS (AV)  FISTULA  ARM ( BRACHIO- CEPHALIC);  Surgeon: Brianna Huxley, MD;  Location: [facility] ORS;  Service: Vascular;  Laterality: Left;   AV FISTULA PLACEMENT Left 02/24/2021   Procedure: INSERTION OF ARTERIOVENOUS (AV) GORE-TEX GRAFT ARM;  Surgeon: Brianna Huxley, MD;  Location: [facility] ORS;  Service: Vascular;  Laterality: Left;  Left brachial Ax graft   CATARACT EXTRACTION, BILATERAL Bilateral    DIALYSIS/PERMA CATHETER INSERTION N/A 10/06/2020   Procedure: DIALYSIS/PERMA CATHETER INSERTION;  Surgeon: Brianna Huxley, MD;  Location: [place] CV LAB;  Service: Cardiovascular;  Laterality: N/A;   LOOP RECORDER INSERTION N/A 07/30/2019   Procedure: LOOP RECORDER INSERTION;  Surgeon: Brianna Cowman, MD;  Location: [place] CV LAB;  Service: Cardiovascular;  Laterality: N/A;   TEE WITHOUT CARDIOVERSION N/A 04/14/2019   Procedure: TRANSESOPHAGEAL ECHOCARDIOGRAM (TEE);  Surgeon: Brianna Spray, MD;  Location: [facility] ORS;  Service: Cardiovascular;  Laterality: N/A;    Social History:  reports that she has never smoked. She has never used smokeless tobacco. She reports that she does not drink alcohol and does not use drugs.  Family History:  Family History  Problem Relation Age of Onset   Stroke Mother    Diabetes Mother    Heart disease Father    Kidney disease Sister    Diabetes Sister    Kidney disease Brother    Heart disease Sister    Diabetes Sister    Diabetes Sister    Diabetes Sister    Kidney disease Sister    Heart disease Sister    Kidney disease Brother        kidney transplant   Early death Brother 57        Truck Accident - died   Heart disease Brother      Prior to Admission medications   Medication Sig Start Date End Date Taking? Authorizing Provider  amLODipine (NORVASC) 10 MG tablet Take 0.5 tablets (5 mg total) by mouth daily. 01/25/21   Brianna Haven, MD  atorvastatin (LIPITOR) 80 MG tablet Take 1 tablet (80 mg total) by mouth daily. 10/14/20   Brianna Haven, MD  carvedilol (COREG) 6.25 MG tablet TAKE 1 TABLET (6.25 MG TOTAL) BY MOUTH 2 (TWO) TIMES DAILY WITH A MEAL. 10/07/20   Brianna Haven, MD  ELIQUIS 2.5 MG TABS tablet Take 1 tablet (2.5 mg total) by mouth 2 (two) times daily. 10/07/20   Brianna Haven, MD  ezetimibe (ZETIA) 10 MG tablet TAKE 1 TABLET BY MOUTH EVERY DAY 12/06/20   Brianna Haven, MD  Ferrous Sulfate (IRON) 28 MG TABS Take 1 tablet by mouth daily in the afternoon.    [provider]  glucose blood (ACCU-CHEK GUIDE) test  strip USE TO CHECK BLOOD SUGARS TWICE DAILY. E11.9 12/27/20   Brianna Haven, MD  hydrALAZINE (APRESOLINE) 50 MG tablet Take 50 mg by mouth 2 (two) times daily.    [provider]  HYDROcodone-acetaminophen (NORCO) 5-325 MG tablet Take 1 tablet by mouth every 6 (six) hours as needed for moderate pain. 02/24/21   Brianna Huxley, MD  Lancets [facility] ULTRASOFT) lancets Use as instructed. Use patient insurance preferred brand. 12/27/20   Brianna Haven, MD  linagliptin (TRADJENTA) 5 MG TABS tablet Take 1 tablet (5 mg total) by mouth daily. 12/23/20   Brianna Haven, MD  mirtazapine (REMERON) 7.5 MG tablet Take 7.5 mg by mouth at bedtime. 01/10/21   [provider]  Vitamin D, Ergocalciferol, (DRISDOL) 50000 units CAPS capsule Take 50,000 Units by mouth every 30 (thirty) days. 10/05/17   [provider]    Physical Exam: Vitals:   02/25/21 1709 02/25/21 1715 02/25/21 1756 02/25/21 1805  BP:  (!) 149/58  (!) 165/90  Pulse:  73  76  Resp:  16  17  Temp: 98.4 F (36.9 C)   97.9 F (36.6 C)   TempSrc: Oral     SpO2:  100%  98%  Weight:   68.4 kg   Height:   5\' 5"  (1.651 m)    General: Not in acute distress HEENT:       Eyes: PERRL, EOMI, no scleral icterus.       ENT: No discharge from the ears and nose, no pharynx injection, no tonsillar enlargement.        Neck: No JVD, no bruit, no mass felt. Heme: No neck lymph node enlargement. Cardiac: S1/S2, RRR, No murmurs, No gallops or rubs. Respiratory: No rales, wheezing, rhonchi or rubs. GI: Soft, nondistended, nontender, no rebound pain, no organomegaly, BS present. GU: No hematuria Ext: 1+DP/PT pulse bilaterally. S/p of AV graft in left arm. Has tenderenss around surgical site Musculoskeletal: No joint deformities, No joint redness or warmth, no limitation of ROM in spin. Skin: No rashes.  Neuro: Alert, oriented X3, cranial nerves II-XII grossly intact. Has weakness mainly in left forearm, wrist and hand, patient can move left upper arm and shoulder. sensation to light touch intact. Brachial reflex 2+ bilaterally.  Psych: Patient is not psychotic, no suicidal or hemocidal ideation.  Labs on Admission: I have personally reviewed following labs and imaging studies  CBC: Recent Labs  Lab 02/24/21 1141 02/24/21 1153 02/25/21 0841  WBC 2.8*  --  6.9  NEUTROABS  --   --  5.6  HGB 10.0* 9.2* 10.5*  HCT 28.2* 27.0* 29.7*  MCV 88.4  --  88.1  PLT 111*  --  923*   Basic Metabolic Panel: Recent Labs  Lab 02/24/21 1153 02/25/21 0841  NA 141 139  K 3.6 3.6  CL 97* 97*  CO2  --  32  GLUCOSE 156* 225*  BUN 20 27*  CREATININE 3.00* 3.40*  CALCIUM  --  9.2   GFR: Estimated Creatinine Clearance: 11.8 mL/min (A) (by C-G formula based on SCr of 3.4 mg/dL (H)). Liver Function Tests: No results for input(s): AST, ALT, ALKPHOS, BILITOT, PROT, ALBUMIN in the last 168 hours. No results for input(s): LIPASE, AMYLASE in the last 168 hours. No results for input(s): AMMONIA in the last 168 hours. Coagulation Profile: No  results for input(s): INR, PROTIME in the last 168 hours. Cardiac Enzymes: No results for input(s): CKTOTAL, CKMB, CKMBINDEX, TROPONINI in the last 168 hours.  BNP (last 3 results) No results for input(s): PROBNP in the last 8760 hours. HbA1C: No results for input(s): HGBA1C in the last 72 hours. CBG: Recent Labs  Lab 02/24/21 1525  GLUCAP 122*   Lipid Profile: No results for input(s): CHOL, HDL, LDLCALC, TRIG, CHOLHDL, LDLDIRECT in the last 72 hours. Thyroid Function Tests: No results for input(s): TSH, T4TOTAL, FREET4, T3FREE, THYROIDAB in the last 72 hours. Anemia Panel: No results for input(s): VITAMINB12, FOLATE, FERRITIN, TIBC, IRON, RETICCTPCT in the last 72 hours. Urine analysis:    Component Value Date/Time   COLORURINE YELLOW 09/10/2020 2141   APPEARANCEUR HAZY (A) 09/10/2020 2141   APPEARANCEUR Clear 11/09/2013 1902   LABSPEC 1.011 09/10/2020 2141   LABSPEC 1.011 11/09/2013 1902   PHURINE 7.0 09/10/2020 2141   GLUCOSEU NEGATIVE 09/10/2020 2141   GLUCOSEU >=1000 (A) 01/06/2016 0923   HGBUR NEGATIVE 09/10/2020 2141   BILIRUBINUR NEGATIVE 09/10/2020 2141   BILIRUBINUR neg 06/09/2020 1247   BILIRUBINUR Negative 11/09/2013 1902   KETONESUR NEGATIVE 09/10/2020 2141   PROTEINUR >=300 (A) 09/10/2020 2141   UROBILINOGEN 0.2 06/09/2020 1247   UROBILINOGEN 0.2 01/06/2016 0923   NITRITE NEGATIVE 09/10/2020 2141   LEUKOCYTESUR MODERATE (A) 09/10/2020 2141   LEUKOCYTESUR Trace 11/09/2013 1902   Sepsis Labs: @LABRCNTIP (procalcitonin:4,lacticidven:4) ) Recent Results (from the past 240 hour(s))  Resp Panel by RT-PCR (Flu A&B, Covid) Nasopharyngeal Swab     Status: None   Collection Time: 02/25/21 12:01 PM   Specimen: Nasopharyngeal Swab; Nasopharyngeal(NP) swabs in vial transport medium  Result Value Ref Range Status   SARS Coronavirus 2 by RT PCR NEGATIVE NEGATIVE Final    Comment: (NOTE) SARS-CoV-2 target nucleic acids are NOT DETECTED.  The SARS-CoV-2 RNA is  generally detectable in upper respiratory specimens during the acute phase of infection. The lowest concentration of SARS-CoV-2 viral copies this assay can detect is 138 copies/mL. A negative result does not preclude SARS-Cov-2 infection and should not be used as the sole basis for treatment or other patient management decisions. A negative result may occur with  improper specimen collection/handling, submission of specimen other than nasopharyngeal swab, presence of viral mutation(s) within the areas targeted by this assay, and inadequate number of viral copies(<138 copies/mL). A negative result must be combined with clinical observations, patient history, and epidemiological information. The expected result is Negative.  Fact Sheet for Patients:  EntrepreneurPulse.com.au  Fact Sheet for Healthcare Providers:  IncredibleEmployment.be  This test is no t yet approved or cleared by the [place] FDA and  has been authorized for detection and/or diagnosis of SARS-CoV-2 by FDA under an Emergency Use Authorization (EUA). This EUA will remain  in effect (meaning this test can be used) for the duration of the COVID-19 declaration under Section 564(b)(1) of the Act, 21 U.S.C.section 360bbb-3(b)(1), unless the authorization is terminated  or revoked sooner.       Influenza A by PCR NEGATIVE NEGATIVE Final   Influenza B by PCR NEGATIVE NEGATIVE Final    Comment: (NOTE) The Xpert Xpress SARS-CoV-2/FLU/RSV plus assay is intended as an aid in the diagnosis of influenza from Nasopharyngeal swab specimens and should not be used as a sole basis for treatment. Nasal washings and aspirates are unacceptable for Xpert Xpress SARS-CoV-2/FLU/RSV testing.  Fact Sheet for Patients: EntrepreneurPulse.com.au  Fact Sheet for Healthcare Providers: IncredibleEmployment.be  This test is not yet approved or cleared by the [place] FDA and has been authorized for detection and/or diagnosis of SARS-CoV-2 by FDA under an Emergency Use  Authorization (EUA). This EUA will remain in effect (meaning this test can be used) for the duration of the COVID-19 declaration under Section 564(b)(1) of the Act, 21 U.S.C. section 360bbb-3(b)(1), unless the authorization is terminated or revoked.  Performed at [facility], [address], [place], [place] [address]      Radiological Exams on Admission: CT HEAD WO CONTRAST (5MM)  Result Date: 02/25/2021 CLINICAL DATA:  87 year old female with dizziness. Acute on chronic right MCA infarct in March. Dialysis patient with pain in the region of left arm AV fistula. EXAM: CT HEAD WITHOUT CONTRAST TECHNIQUE: Contiguous axial images were obtained from the base of the skull through the vertex without intravenous contrast. COMPARISON:  Brain MRI 09/12/2020.  Head CT 09/10/2020. FINDINGS: Brain: Right MCA territory encephalomalacia. Mild ex vacuo enlargement of the ventricles. Patchy and confluent additional bilateral cerebral white matter hypodensity. No midline shift, ventriculomegaly, mass effect, evidence of mass lesion, intracranial hemorrhage or evidence of cortically based acute infarction. Vascular: Extensive Calcified atherosclerosis at the skull base. Right MCA calcified atherosclerosis. No suspicious intracranial vascular hyperdensity. Skull: No acute osseous abnormality identified. Sinuses/Orbits: Continued bubbly opacity in the right sphenoid sinus. Other Visualized paranasal sinuses and mastoids are clear. Other: No acute orbit or scalp soft tissue finding. IMPRESSION: 1. Chronic right MCA territory infarct and advanced chronic small vessel disease. 2. No acute intracranial abnormality. Electronically Signed   By: Genevie Ann M.D.   On: 02/25/2021 10:16   CT ANGIO UP EXTREM LEFT W &/OR WO CONTAST  Result Date: 02/25/2021 CLINICAL DATA:  87 year old female with a murmur at the  site of AV fistula EXAM: CT ANGIOGRAPHY UPPER LEFT EXTREMITY TECHNIQUE: Axial spiral CT images were acquired of the left upper extremity after administration of a standard IV contrast bolus. The images were acquired with timing specific for the arterial system. Axial and coronal images were performed on a separate workstation CONTRAST:  15mL OMNIPAQUE IOHEXOL 350 MG/ML SOLN COMPARISON:  None. FINDINGS: VASCULAR: Axillary artery is patent with mild atherosclerosis and no high-grade stenosis or occlusion. The humeral circumflex branches remain patent. Brachial artery is patent without significant atherosclerosis throughout the upper arm. Surgical changes of arteriovenous fistula/dialysis circuit of the upper arm. The anastomosis is patent, with patent venous outflow through the brachial vein, axillary vein, and distal subclavian vein. Distal to the anastomosis the brachial artery remains patent to the antecubital region. Radial artery and the ulnar artery are patent to the distal forearm. There is decreased attenuation distally, which may be secondary to late arrival of the contrast bolus. The radial artery is dominant compared to the ulnar artery. A small interosseous artery is present proximally, without opacification distally. Incidentally imaged left common femoral artery appears patent as well as the proximal profunda femoris and SFA. The pelvis is not imaged. Nonvascular: Gas within the soft tissues of the left upper arm adjacent to the fistula. No pooling of contrast or extravasation of contrast. Edema within the fat planes adjacent to the musculature of the upper arm without fluid collection. Degenerative changes of the glenohumeral joint, acromioclavicular joint. No acute displaced fracture. Review of the MIP images confirms the above findings. IMPRESSION: CT angiogram demonstrates a patent left upper extremity arteriovenous dialysis circuit. If there is concern for hemodynamic assessment, directed duplex is  a more sensitive and specific test, and should be considered if there is a suspicion of flow abnormality. Gas and edema within the soft tissues of the left upper arm adjacent to the dialysis circuit. While  this is presumably related to usage/cannulation, soft tissue infection/cellulitis can not be excluded on CT. Signed, Dulcy Fanny. Dellia Nims, RPVI [facility] Radiology Electronically Signed   By: Corrie Mckusick D.O.   On: 02/25/2021 10:17   MR BRAIN WO CONTRAST  Result Date: 02/25/2021 CLINICAL DATA:  Stroke suspected EXAM: MRI HEAD WITHOUT CONTRAST TECHNIQUE: Multiplanar, multiecho pulse sequences of the brain and surrounding structures were obtained without intravenous contrast. COMPARISON:  09/12/2020 MRI, correlation is also made with CT head 02/25/2021. FINDINGS: Evaluation somewhat limited by motion artifact. Brain: Small area of cortical restricted diffusion in the right parietal lobe (series 5, images 33-40), within area of encephalomalacia from prior right MCA territory infarct. No acute hemorrhage, mass, mass effect, or midline shift. No extra-axial fluid collection. Redemonstrated chronic microhemorrhages in the bilateral deep gray nuclei. Confluent T2 hyperintense signal in the periventricular white matter and pons, likely the sequela of here chronic small vessel ischemic disease. Unchanged ex vacuo dilatation of the right lateral ventricle. Vascular: 08/21/2019 Skull and upper cervical spine: Normal marrow signal. Sinuses/Orbits: Status post bilateral lens replacements. Otherwise negative. Other: Trace fluid in right mastoid air cells. IMPRESSION: Evaluation is somewhat limited by motion artifact. Within this limitation, there is acute ischemia within an area of remote infarct in the right parietal lobe. No evidence of hemorrhagic transformation. These results were called by telephone at the time of interpretation on 02/25/2021 at 8:16 am to provider  [facility] , who verbally acknowledged these results. Electronically Signed   By: Merilyn Baba M.D.   On: 02/25/2021 11:18   [place] OR NERVE BLOCK-IMAGE ONLY [facility])  Result Date: 02/24/2021 There is no interpretation for this exam.  This order is for images obtained during a surgical procedure.  Please See "Surgeries" Tab for more information regarding the procedure.     EKG:  Not done in ED, will get one.   Assessment/Plan Principal Problem:   Stroke [facility]) Active Problems:   Essential hypertension   HLD (hyperlipidemia)   Type II diabetes mellitus with renal manifestations (HCC)   Anemia in ESRD (end-stage renal disease) (HCC)   Atrial fibrillation (HCC)   End stage renal disease on dialysis ([place])   Left arm pain   Thrombocytopenia ([place])   Stroke [facility]): pt has hx of stroke. She has left arm weakness. MRI showed possible new stroke. Dr. Rory Dodson is consulted. He initially recommended to get CT angiogram of head and neck, but since patient just had CT angiogram done, therefore will change to MRA of brain and carotid doppler.  - Placed on MedSurg bed for observation - Obtain MRA  - Check carotid dopplers  - Lipit or and Zetia - will need to restart Eliquis tomorrow per Dr. Rory Dodson. VVS is ok for restaring Eliquis. - fasting lipid panel and HbA1c  - 2D transthoracic echocardiography  - swallowing screen. If fails, will get SLP - PT/OT consult  Essential hypertension -IV hydralazine as needed -Amlodipine, Coreg, hydralazine  ESRD-HD: -consulted Dr. Juleen Dodson for dialysis  HLD (hyperlipidemia) -Lipitor and Zetia  Type II diabetes mellitus with renal manifestations [facility]): Recent A1c 6.8, well controlled.  Patient is taking Tradjenta -Sliding scale insulin  Anemia in ESRD (end-stage renal disease) ([place]): Hemoglobin stable, 10.5 -Continue iron supplement  Atrial fibrillation (HCC) -Coreg -Restart Eliquis tomorrow  Left arm pain after AV graft placement: Etiology is not clear.   Dr. Lucky Dodson of vascular surgery is consulted -Pain control -Follow-up with Dr. Lucky Dodson recommendations  Thrombocytopenia [facility]): This  is chronic issue.  Hemoglobin 115.  No bleeding tendency. -Follow-up with CBC     DVT ppx: SCD Code Status: partial code per her sister (OK for CPR and no intubation) Family Communication: Yes, patient's sister by phone Disposition Plan:  Anticipate discharge back to previous environment Consults called:  rDew of VVS, Dr. Juleen Dodson of renal, Dr. Rory Dodson of neuro are consulted. Admission status and Level of care: Med-Surg:    Med-surg bed for obs    Status is: Observation  The patient remains OBS appropriate and will d/c before 2 midnights.  Dispo: The patient is from: Home              Anticipated d/c is to: Home              Patient currently is not medically stable to d/c.   Difficult to place patient No           Date of Service 02/25/2021    Ivor Costa [facility]   If 7PM-7AM, please contact night-coverage [email or web address] 02/25/2021, 7:13 PM

## 2021-02-25 NOTE — ED Triage Notes (Signed)
C/O left arm throbbing since last night.  Has AV fistula in left arm.  Last dialysis Wednesday.

## 2021-02-25 NOTE — ED Notes (Signed)
Patient given warm blanket.

## 2021-02-25 NOTE — ED Notes (Signed)
pts oxygen dipped to 88 % RA , ed rn placed pt on [facility] , md made aware

## 2021-02-25 NOTE — Progress Notes (Signed)
[facility] Daily Progress Note   02/24/21: Left upper arm brachial artery to axillary vein arteriovenous graft  Subjective: Patient is well-known to the service.  She is status post creation of a left upper extremity brachiocephalic AV fistula a few months ago.  Unfortunately this access was never able to matu and a new brachial axillary graft was created yesterday.  Patient presented back to the [facility] emergency department yesterday evening complaining of left upper extremity pain and inability to move her hand.  Objective: Vitals:   02/25/21 0816 02/25/21 0817 02/25/21 1128  BP:  (!) 155/98 (!) 151/77  Pulse:  87 85  Resp:  14 17  Temp:  98.3 F (36.8 C)   TempSrc:  Oral   SpO2:  99% 97%  Weight: 65.3 kg    Height: 5\' 5"  (1.651 m)     No intake or output data in the 24 hours ending 02/25/21 1129  Physical Exam: A&Ox3, NAD CV: RRR Pulmonary: CTA Bilaterally Abdomen: Soft, Nontender, Nondistended Vascular:   Left upper extremity: Soft.  Warm distally to the hand however the fingers do become a little cooler.  Hard to palpate a radial pulse.  Patient unable to grip my hand or examination.   Laboratory: CBC    Component Value Date/Time   WBC 6.9 02/25/2021 0841   HGB 10.5 (L) 02/25/2021 0841   HGB 12.1 11/09/2013 1540   HCT 29.7 (L) 02/25/2021 0841   HCT 36.3 11/09/2013 1540   PLT 115 (L) 02/25/2021 0841   PLT 183 11/09/2013 1540   BMET    Component Value Date/Time   NA 139 02/25/2021 0841   NA 142 11/10/2013 0522   K 3.6 02/25/2021 0841   K 4.6 11/10/2013 0522   CL 97 (L) 02/25/2021 0841   CL 110 (H) 11/10/2013 0522   CO2 32 02/25/2021 0841   CO2 27 11/10/2013 0522   GLUCOSE 225 (H) 02/25/2021 0841   GLUCOSE 70 11/10/2013 0522   BUN 27 (H) 02/25/2021 0841   BUN 22 (H) 11/10/2013 0522   CREATININE 3.40 (H) 02/25/2021 0841   CREATININE 1.12 11/10/2013 0522   CALCIUM 9.2 02/25/2021 0841   CALCIUM 8.4 (L)  11/10/2013 0522   GFRNONAA 13 (L) 02/25/2021 0841   GFRNONAA 47 (L) 11/10/2013 0522   GFRAA 27 (L) 08/22/2019 0449   GFRAA 54 (L) 11/10/2013 0522   Assessment/Planning: The patient is an 87 year old female well-known to our service with a past medical history of end-stage renal disease who initially underwent brachiocephalic AV fistula creation a few months ago which never matured and a new brachial axillary graft was created yesterday.  Patient presented back to the [facility] emergency department yesterday evening complaining of worsening left upper extremity pain specifically to the hand and inability to move her hand.  1) CT angio of the left upper extremity was notable for "patent left upper extremity dialysis circuit".  It is hard to palpate a radial pulse on exam.  The arm is warm and does transition to becoming a little cooler at the fingertips.  At this time, the patient cannot grip my hand on exam.  The patient did undergo a nerve block prior to surgery for anesthesia purposes yesterday however this should have worn off by today.   2) MRI of the brain conducted on February 25, 2021 was notable for "acute ischemia within the area of remote infarct in the right parietal lobe".  This could also  possibly be contributing to the patient's inability to move her left hand.  Patient will be admitted to medicine and most likely undergo a neurology consult.  We restarted the patient's Eliquis postoperatively - we are okay with any other additional anticoagulation if needed.   3) At this time, would recommend admission for observation with serial left upper extremity vascular/neuro checks.  If steal syndrome is the complicating factor then we would need to take the patient to the operating room to ligate her graft possibly tomorrow.  The patient is currently maintained via a right hernia cath.  Nephrology will need to be consulted for dialysis.  Discussed with Dr. [name].  Marcelle Overlie PA-C 02/25/2021 11:29 AM

## 2021-02-26 ENCOUNTER — Encounter
Admission: EM | Disposition: A | Discharge status: Skilled Nursing Facility | Payer: Self-pay | Source: Home / Self Care | Attending: Internal Medicine

## 2021-02-26 ENCOUNTER — Inpatient Hospital Stay: Payer: Medicare Other | Admitting: Anesthesiology

## 2021-02-26 DIAGNOSIS — T82868A Thrombosis of vascular prosthetic devices, implants and grafts, initial encounter: Secondary | ICD-10-CM | POA: Diagnosis not present

## 2021-02-26 DIAGNOSIS — Z79899 Other long term (current) drug therapy: Secondary | ICD-10-CM | POA: Diagnosis not present

## 2021-02-26 DIAGNOSIS — Z20822 Contact with and (suspected) exposure to covid-19: Secondary | ICD-10-CM | POA: Diagnosis present

## 2021-02-26 DIAGNOSIS — N2581 Secondary hyperparathyroidism of renal origin: Secondary | ICD-10-CM | POA: Diagnosis present

## 2021-02-26 DIAGNOSIS — W19XXXA Unspecified fall, initial encounter: Secondary | ICD-10-CM | POA: Diagnosis not present

## 2021-02-26 DIAGNOSIS — M79622 Pain in left upper arm: Secondary | ICD-10-CM | POA: Diagnosis not present

## 2021-02-26 DIAGNOSIS — Z992 Dependence on renal dialysis: Secondary | ICD-10-CM | POA: Diagnosis not present

## 2021-02-26 DIAGNOSIS — I120 Hypertensive chronic kidney disease with stage 5 chronic kidney disease or end stage renal disease: Secondary | ICD-10-CM | POA: Diagnosis present

## 2021-02-26 DIAGNOSIS — I639 Cerebral infarction, unspecified: Secondary | ICD-10-CM | POA: Diagnosis not present

## 2021-02-26 DIAGNOSIS — R29705 NIHSS score 5: Secondary | ICD-10-CM | POA: Diagnosis present

## 2021-02-26 DIAGNOSIS — T829XXS Unspecified complication of cardiac and vascular prosthetic device, implant and graft, sequela: Secondary | ICD-10-CM | POA: Diagnosis not present

## 2021-02-26 DIAGNOSIS — T829XXD Unspecified complication of cardiac and vascular prosthetic device, implant and graft, subsequent encounter: Secondary | ICD-10-CM | POA: Diagnosis not present

## 2021-02-26 DIAGNOSIS — I63511 Cerebral infarction due to unspecified occlusion or stenosis of right middle cerebral artery: Secondary | ICD-10-CM | POA: Diagnosis present

## 2021-02-26 DIAGNOSIS — I10 Essential (primary) hypertension: Secondary | ICD-10-CM | POA: Diagnosis not present

## 2021-02-26 DIAGNOSIS — R404 Transient alteration of awareness: Secondary | ICD-10-CM | POA: Diagnosis not present

## 2021-02-26 DIAGNOSIS — M79602 Pain in left arm: Secondary | ICD-10-CM | POA: Diagnosis not present

## 2021-02-26 DIAGNOSIS — E1122 Type 2 diabetes mellitus with diabetic chronic kidney disease: Secondary | ICD-10-CM | POA: Diagnosis present

## 2021-02-26 DIAGNOSIS — N186 End stage renal disease: Secondary | ICD-10-CM | POA: Diagnosis present

## 2021-02-26 DIAGNOSIS — Z833 Family history of diabetes mellitus: Secondary | ICD-10-CM | POA: Diagnosis not present

## 2021-02-26 DIAGNOSIS — I4891 Unspecified atrial fibrillation: Secondary | ICD-10-CM | POA: Diagnosis not present

## 2021-02-26 DIAGNOSIS — D6959 Other secondary thrombocytopenia: Secondary | ICD-10-CM | POA: Diagnosis present

## 2021-02-26 DIAGNOSIS — T82590A Other mechanical complication of surgically created arteriovenous fistula, initial encounter: Secondary | ICD-10-CM | POA: Diagnosis not present

## 2021-02-26 DIAGNOSIS — Z7901 Long term (current) use of anticoagulants: Secondary | ICD-10-CM | POA: Diagnosis not present

## 2021-02-26 DIAGNOSIS — R600 Localized edema: Secondary | ICD-10-CM | POA: Diagnosis not present

## 2021-02-26 DIAGNOSIS — Y832 Surgical operation with anastomosis, bypass or graft as the cause of abnormal reaction of the patient, or of later complication, without mention of misadventure at the time of the procedure: Secondary | ICD-10-CM | POA: Diagnosis present

## 2021-02-26 DIAGNOSIS — G309 Alzheimer's disease, unspecified: Secondary | ICD-10-CM | POA: Diagnosis present

## 2021-02-26 DIAGNOSIS — T829XXA Unspecified complication of cardiac and vascular prosthetic device, implant and graft, initial encounter: Secondary | ICD-10-CM

## 2021-02-26 DIAGNOSIS — M6281 Muscle weakness (generalized): Secondary | ICD-10-CM | POA: Diagnosis not present

## 2021-02-26 DIAGNOSIS — D696 Thrombocytopenia, unspecified: Secondary | ICD-10-CM | POA: Diagnosis not present

## 2021-02-26 DIAGNOSIS — E785 Hyperlipidemia, unspecified: Secondary | ICD-10-CM | POA: Diagnosis present

## 2021-02-26 DIAGNOSIS — M79632 Pain in left forearm: Secondary | ICD-10-CM | POA: Diagnosis not present

## 2021-02-26 DIAGNOSIS — F0280 Dementia in other diseases classified elsewhere without behavioral disturbance: Secondary | ICD-10-CM | POA: Diagnosis present

## 2021-02-26 DIAGNOSIS — D631 Anemia in chronic kidney disease: Secondary | ICD-10-CM | POA: Diagnosis present

## 2021-02-26 DIAGNOSIS — T82898A Other specified complication of vascular prosthetic devices, implants and grafts, initial encounter: Secondary | ICD-10-CM | POA: Diagnosis present

## 2021-02-26 DIAGNOSIS — I480 Paroxysmal atrial fibrillation: Secondary | ICD-10-CM | POA: Diagnosis present

## 2021-02-26 DIAGNOSIS — E119 Type 2 diabetes mellitus without complications: Secondary | ICD-10-CM | POA: Diagnosis not present

## 2021-02-26 DIAGNOSIS — R278 Other lack of coordination: Secondary | ICD-10-CM | POA: Diagnosis not present

## 2021-02-26 DIAGNOSIS — R2681 Unsteadiness on feet: Secondary | ICD-10-CM | POA: Diagnosis not present

## 2021-02-26 DIAGNOSIS — Z90710 Acquired absence of both cervix and uterus: Secondary | ICD-10-CM | POA: Diagnosis not present

## 2021-02-26 DIAGNOSIS — Z743 Need for continuous supervision: Secondary | ICD-10-CM | POA: Diagnosis not present

## 2021-02-26 DIAGNOSIS — G8194 Hemiplegia, unspecified affecting left nondominant side: Secondary | ICD-10-CM | POA: Diagnosis present

## 2021-02-26 DIAGNOSIS — Z7984 Long term (current) use of oral hypoglycemic drugs: Secondary | ICD-10-CM | POA: Diagnosis not present

## 2021-02-26 HISTORY — PX: LIGATION OF ARTERIOVENOUS  FISTULA: SHX5948

## 2021-02-26 LAB — HEMOGLOBIN A1C
Hgb A1c MFr Bld: 6.1 % — ABNORMAL HIGH (ref 4.8–5.6)
Mean Plasma Glucose: 128.37 mg/dL

## 2021-02-26 LAB — GLUCOSE, CAPILLARY
Glucose-Capillary: 125 mg/dL — ABNORMAL HIGH (ref 70–99)
Glucose-Capillary: 158 mg/dL — ABNORMAL HIGH (ref 70–99)
Glucose-Capillary: 133 mg/dL — ABNORMAL HIGH (ref 70–99)
Glucose-Capillary: 215 mg/dL — ABNORMAL HIGH (ref 70–99)

## 2021-02-26 LAB — LIPID PANEL
Cholesterol: 110 mg/dL (ref 0–200)
HDL: 47 mg/dL (ref >40)
LDL Cholesterol: 49 mg/dL (ref 0–99)
Total CHOL/HDL Ratio: 2.3 RATIO
Triglycerides: 68 mg/dL (ref <150)
VLDL: 14 mg/dL (ref 0–40)

## 2021-02-26 SURGERY — LIGATION OF ARTERIOVENOUS  FISTULA
Anesthesia: Monitor Anesthesia Care | Laterality: Left

## 2021-02-26 SURGERY — LIGATION ARTERIOVENOUS GORTEX GRAFT
Anesthesia: Monitor Anesthesia Care | Laterality: Left

## 2021-02-26 MED ORDER — MIRTAZAPINE 15 MG PO TABS
7.5000 mg | ORAL_TABLET | Freq: Every day | ORAL | Status: DC
  Administered 2021-02-26 – 2021-03-02 (×5): 7.5 mg via ORAL
  Filled 2021-02-26 (×6): qty 1

## 2021-02-26 MED ORDER — EZETIMIBE 10 MG PO TABS
10.0000 mg | ORAL_TABLET | Freq: Every day | ORAL | Status: DC
  Administered 2021-02-27 – 2021-03-03 (×5): 10 mg via ORAL
  Filled 2021-02-26 (×5): qty 1

## 2021-02-26 MED ORDER — AMLODIPINE BESYLATE 5 MG PO TABS
5.0000 mg | ORAL_TABLET | Freq: Every day | ORAL | Status: DC
  Administered 2021-02-26 – 2021-03-03 (×6): 5 mg via ORAL
  Filled 2021-02-26 (×6): qty 1

## 2021-02-26 MED ORDER — FENTANYL CITRATE (PF) 100 MCG/2ML IJ SOLN: 25.0000 ug | INTRAMUSCULAR | Status: DC | PRN

## 2021-02-26 MED ORDER — CEFAZOLIN SODIUM-DEXTROSE 1-4 GM/50ML-% IV SOLN
INTRAVENOUS | Status: DC | PRN
  Administered 2021-02-26: 1 g via INTRAVENOUS

## 2021-02-26 MED ORDER — LIDOCAINE HCL (PF) 1 % IJ SOLN
INTRAMUSCULAR | Status: DC | PRN
  Administered 2021-02-26: 5 mL

## 2021-02-26 MED ORDER — ONDANSETRON HCL 4 MG/2ML IJ SOLN: 4.0000 mg | Freq: Once | INTRAMUSCULAR | Status: DC | PRN

## 2021-02-26 MED ORDER — PHENYLEPHRINE HCL (PRESSORS) 10 MG/ML IV SOLN
INTRAVENOUS | Status: DC | PRN
  Administered 2021-02-26: 200 ug via INTRAVENOUS
  Administered 2021-02-26 (×3): 100 ug via INTRAVENOUS

## 2021-02-26 MED ORDER — ATORVASTATIN CALCIUM 20 MG PO TABS
80.0000 mg | ORAL_TABLET | Freq: Every day | ORAL | Status: DC
  Administered 2021-02-26 – 2021-03-03 (×6): 80 mg via ORAL
  Filled 2021-02-26 (×6): qty 4

## 2021-02-26 MED ORDER — MEPERIDINE HCL 25 MG/ML IJ SOLN
6.2500 mg | INTRAMUSCULAR | Status: DC | PRN
Start: 2021-02-26 — End: 2021-02-26

## 2021-02-26 MED ORDER — APIXABAN 2.5 MG PO TABS
2.5000 mg | ORAL_TABLET | Freq: Two times a day (BID) | ORAL | Status: DC
  Administered 2021-02-26 – 2021-03-03 (×10): 2.5 mg via ORAL
  Filled 2021-02-26 (×10): qty 1

## 2021-02-26 MED ORDER — FERROUS SULFATE 325 (65 FE) MG PO TABS
325.0000 mg | ORAL_TABLET | Freq: Every day | ORAL | Status: DC
  Administered 2021-02-26 – 2021-03-03 (×6): 325 mg via ORAL
  Filled 2021-02-26 (×6): qty 1

## 2021-02-26 MED ORDER — HYDRALAZINE HCL 50 MG PO TABS
50.0000 mg | ORAL_TABLET | Freq: Two times a day (BID) | ORAL | Status: DC
  Administered 2021-02-26 – 2021-03-01 (×5): 50 mg via ORAL
  Filled 2021-02-26 (×7): qty 1

## 2021-02-26 MED ORDER — FENTANYL CITRATE (PF) 100 MCG/2ML IJ SOLN
INTRAMUSCULAR | Status: AC
  Filled 2021-02-26: qty 2

## 2021-02-26 MED ORDER — PROPOFOL 10 MG/ML IV BOLUS
INTRAVENOUS | Status: DC | PRN
  Administered 2021-02-26: 30 mg via INTRAVENOUS

## 2021-02-26 MED ORDER — SODIUM CHLORIDE 0.9 % IV SOLN: INTRAVENOUS | Status: DC | PRN

## 2021-02-26 MED ORDER — BUPIVACAINE HCL (PF) 0.5 % IJ SOLN
INTRAMUSCULAR | Status: DC | PRN
  Administered 2021-02-26: 5 mL

## 2021-02-26 MED ORDER — PROPOFOL 10 MG/ML IV BOLUS
INTRAVENOUS | Status: AC
  Filled 2021-02-26: qty 20

## 2021-02-26 MED ORDER — PROPOFOL 500 MG/50ML IV EMUL
INTRAVENOUS | Status: DC | PRN
  Administered 2021-02-26: 75 ug/kg/min via INTRAVENOUS

## 2021-02-26 MED ORDER — CARVEDILOL 6.25 MG PO TABS
6.2500 mg | ORAL_TABLET | Freq: Two times a day (BID) | ORAL | Status: DC
  Administered 2021-02-27 – 2021-03-03 (×7): 6.25 mg via ORAL
  Filled 2021-02-26 (×8): qty 1

## 2021-02-26 MED ORDER — 0.9 % SODIUM CHLORIDE (POUR BTL) OPTIME
TOPICAL | Status: DC | PRN
  Administered 2021-02-26: 500 mL

## 2021-02-26 MED ORDER — FENTANYL CITRATE (PF) 100 MCG/2ML IJ SOLN
INTRAMUSCULAR | Status: DC | PRN
  Administered 2021-02-26: 25 ug via INTRAVENOUS

## 2021-02-26 SURGICAL SUPPLY — 53 items
BAG DECANTER FOR FLEXI CONT (MISCELLANEOUS) ×2 IMPLANT
BLADE SURG SZ11 CARB STEEL (BLADE) ×2 IMPLANT
BOOT SUTURE AID YELLOW STND (SUTURE) ×2 IMPLANT
CANISTER SUCT 1200ML W/VALVE (MISCELLANEOUS) ×2 IMPLANT
CHLORAPREP W/TINT 26 (MISCELLANEOUS) ×2 IMPLANT
CLIP SPRNG 6MM S-JAW DBL (CLIP)
DERMABOND ADVANCED (GAUZE/BANDAGES/DRESSINGS) ×1
DERMABOND ADVANCED .7 DNX12 (GAUZE/BANDAGES/DRESSINGS) ×1 IMPLANT
DRSG OPSITE POSTOP 3X4 (GAUZE/BANDAGES/DRESSINGS) ×2 IMPLANT
ELECT CAUTERY BLADE 6.4 (BLADE) ×2 IMPLANT
ELECT REM PT RETURN 9FT ADLT (ELECTROSURGICAL) ×2
ELECTRODE REM PT RTRN 9FT ADLT (ELECTROSURGICAL) ×1 IMPLANT
GAUZE 4X4 16PLY ~~LOC~~+RFID DBL (SPONGE) ×2 IMPLANT
GLOVE SURG ENC MOIS LTX SZ7 (GLOVE) ×2 IMPLANT
GLOVE SURG SYN 7.0 (GLOVE) ×2 IMPLANT
GLOVE SURG UNDER LTX SZ7.5 (GLOVE) ×2 IMPLANT
GOWN STRL REUS W/ TWL LRG LVL3 (GOWN DISPOSABLE) ×1 IMPLANT
GOWN STRL REUS W/ TWL XL LVL3 (GOWN DISPOSABLE) ×2 IMPLANT
GOWN STRL REUS W/TWL LRG LVL3 (GOWN DISPOSABLE) ×1
GOWN STRL REUS W/TWL XL LVL3 (GOWN DISPOSABLE) ×2
HEMOSTAT SURGICEL 2X3 (HEMOSTASIS) ×2 IMPLANT
IV NS 500ML (IV SOLUTION) ×1
IV NS 500ML BAXH (IV SOLUTION) ×1 IMPLANT
LABEL OR SOLS (LABEL) ×2 IMPLANT
LOOP RED MAXI  1X406MM (MISCELLANEOUS) ×1
LOOP VESSEL MAXI 1X406 RED (MISCELLANEOUS) ×1 IMPLANT
LOOP VESSEL MINI 0.8X406 BLUE (MISCELLANEOUS) ×1 IMPLANT
LOOPS BLUE MINI 0.8X406MM (MISCELLANEOUS) ×1
MANIFOLD NEPTUNE II (INSTRUMENTS) ×2 IMPLANT
NEEDLE FILTER BLUNT 18X 1/2SAF (NEEDLE) ×1
NEEDLE FILTER BLUNT 18X1 1/2 (NEEDLE) ×1 IMPLANT
NS IRRIG 500ML POUR BTL (IV SOLUTION) ×2 IMPLANT
PACK EXTREMITY ARMC (MISCELLANEOUS) ×2 IMPLANT
PAD PREP 24X41 OB/GYN DISP (PERSONAL CARE ITEMS) ×2 IMPLANT
SOLUTION CELL SAVER (CLIP) IMPLANT
STAPLER SKIN PROX 35W (STAPLE) ×2 IMPLANT
STOCKINETTE STRL 4IN 9604848 (GAUZE/BANDAGES/DRESSINGS) ×2 IMPLANT
SUT MNCRL AB 4-0 PS2 18 (SUTURE) ×2 IMPLANT
SUT PROLENE 6 0 BV (SUTURE) ×2 IMPLANT
SUT SILK 0 (SUTURE) ×1
SUT SILK 0 30XBRD TIE 6 (SUTURE) ×1 IMPLANT
SUT SILK 2 0 (SUTURE)
SUT SILK 2 0 SH (SUTURE) ×2 IMPLANT
SUT SILK 2-0 18XBRD TIE 12 (SUTURE) IMPLANT
SUT SILK 3 0 (SUTURE)
SUT SILK 3-0 18XBRD TIE 12 (SUTURE) IMPLANT
SUT SILK 4 0 (SUTURE)
SUT SILK 4-0 18XBRD TIE 12 (SUTURE) IMPLANT
SUT VIC AB 3-0 SH 27 (SUTURE) ×1
SUT VIC AB 3-0 SH 27X BRD (SUTURE) ×1 IMPLANT
SYR 20ML LL LF (SYRINGE) ×2 IMPLANT
SYR 3ML LL SCALE MARK (SYRINGE) ×2 IMPLANT
WATER STERILE IRR 500ML POUR (IV SOLUTION) ×2 IMPLANT

## 2021-02-26 NOTE — Progress Notes (Signed)
[facility]  PROGRESS NOTE   Subjective:   Awake and alert.  Comfortable Events noted. Vascular note appreciated Had stable HD yesterday.  Objective:  Vital signs in last 24 hours:  Temp:  [97.4 F (36.3 C)-98.4 F (36.9 C)] 98.4 F (36.9 C) (08/27 1149) Pulse Rate:  [69-76] 75 (08/27 1149) Resp:  [15-19] 16 (08/27 1149) BP: (109-177)/(51-109) 124/51 (08/27 1149) SpO2:  [97 %-100 %] 98 % (08/27 1149) Weight:  [68.4 kg] 68.4 kg (08/26 1756)  Weight change:  Filed Weights   02/25/21 0816 02/25/21 1756  Weight: 65.3 kg 68.4 kg    Intake/Output: I/O last 3 completed shifts: In: 120 [P.O.:120] Out: 0    Intake/Output this shift:  No intake/output data recorded.  Physical Exam: General:  No acute distress  Head:  Normocephalic, atraumatic. Moist oral mucosal membranes  Eyes:  Anicteric  Neck:  Supple  Lungs:   Clear to auscultation, normal effort  Heart:  S1S2 no rubs  Abdomen:   Soft, nontender, bowel sounds present  Extremities:  peripheral edema.  Neurologic:  Awake, alert, following commands  Skin:  No lesions  Access:     Basic Metabolic Panel: Recent Labs  Lab 02/24/21 1153 02/25/21 0841  NA 141 139  K 3.6 3.6  CL 97* 97*  CO2  --  32  GLUCOSE 156* 225*  BUN 20 27*  CREATININE 3.00* 3.40*  CALCIUM  --  9.2    CBC: Recent Labs  Lab 02/24/21 1141 02/24/21 1153 02/25/21 0841  WBC 2.8*  --  6.9  NEUTROABS  --   --  5.6  HGB 10.0* 9.2* 10.5*  HCT 28.2* 27.0* 29.7*  MCV 88.4  --  88.1  PLT 111*  --  115*     Urinalysis: No results for input(s): COLORURINE, LABSPEC, PHURINE, GLUCOSEU, HGBUR, BILIRUBINUR, KETONESUR, PROTEINUR, UROBILINOGEN, NITRITE, LEUKOCYTESUR in the last 72 hours.  Invalid input(s): APPERANCEUR    Imaging: CT HEAD WO CONTRAST (5MM)  Result Date: 02/25/2021 CLINICAL DATA:  87 year old female with dizziness. Acute on chronic right MCA infarct in March. Dialysis patient with pain in the region of left arm  AV fistula. EXAM: CT HEAD WITHOUT CONTRAST TECHNIQUE: Contiguous axial images were obtained from the base of the skull through the vertex without intravenous contrast. COMPARISON:  Brain MRI 09/12/2020.  Head CT 09/10/2020. FINDINGS: Brain: Right MCA territory encephalomalacia. Mild ex vacuo enlargement of the ventricles. Patchy and confluent additional bilateral cerebral white matter hypodensity. No midline shift, ventriculomegaly, mass effect, evidence of mass lesion, intracranial hemorrhage or evidence of cortically based acute infarction. Vascular: Extensive Calcified atherosclerosis at the skull base. Right MCA calcified atherosclerosis. No suspicious intracranial vascular hyperdensity. Skull: No acute osseous abnormality identified. Sinuses/Orbits: Continued bubbly opacity in the right sphenoid sinus. Other Visualized paranasal sinuses and mastoids are clear. Other: No acute orbit or scalp soft tissue finding. IMPRESSION: 1. Chronic right MCA territory infarct and advanced chronic small vessel disease. 2. No acute intracranial abnormality. Electronically Signed   By: Genevie Ann M.D.   On: 02/25/2021 10:16   CT ANGIO UP EXTREM LEFT W &/OR WO CONTAST  Result Date: 02/25/2021 CLINICAL DATA:  87 year old female with a murmur at the site of AV fistula EXAM: CT ANGIOGRAPHY UPPER LEFT EXTREMITY TECHNIQUE: Axial spiral CT images were acquired of the left upper extremity after administration of a standard IV contrast bolus. The images were acquired with timing specific for the arterial system. Axial and coronal images were performed on a separate  workstation CONTRAST:  61mL OMNIPAQUE IOHEXOL 350 MG/ML SOLN COMPARISON:  None. FINDINGS: VASCULAR: Axillary artery is patent with mild atherosclerosis and no high-grade stenosis or occlusion. The humeral circumflex branches remain patent. Brachial artery is patent without significant atherosclerosis throughout the upper arm. Surgical changes of arteriovenous fistula/dialysis  circuit of the upper arm. The anastomosis is patent, with patent venous outflow through the brachial vein, axillary vein, and distal subclavian vein. Distal to the anastomosis the brachial artery remains patent to the antecubital region. Radial artery and the ulnar artery are patent to the distal forearm. There is decreased attenuation distally, which may be secondary to late arrival of the contrast bolus. The radial artery is dominant compared to the ulnar artery. A small interosseous artery is present proximally, without opacification distally. Incidentally imaged left common femoral artery appears patent as well as the proximal profunda femoris and SFA. The pelvis is not imaged. Nonvascular: Gas within the soft tissues of the left upper arm adjacent to the fistula. No pooling of contrast or extravasation of contrast. Edema within the fat planes adjacent to the musculature of the upper arm without fluid collection. Degenerative changes of the glenohumeral joint, acromioclavicular joint. No acute displaced fracture. Review of the MIP images confirms the above findings. IMPRESSION: CT angiogram demonstrates a patent left upper extremity arteriovenous dialysis circuit. If there is concern for hemodynamic assessment, directed duplex is a more sensitive and specific test, and should be considered if there is a suspicion of flow abnormality. Gas and edema within the soft tissues of the left upper arm adjacent to the dialysis circuit. While this is presumably related to usage/cannulation, soft tissue infection/cellulitis can not be excluded on CT. Signed, Dulcy Fanny. Dellia Nims, RPVI [facility] Electronically Signed   By: Corrie Mckusick D.O.   On: 02/25/2021 10:17   MR ANGIO HEAD WO CONTRAST  Result Date: 02/25/2021 CLINICAL DATA:  Follow-up examination for acute stroke. EXAM: MRA HEAD WITHOUT CONTRAST TECHNIQUE: Angiographic images of the Circle of Willis were  acquired using MRA technique without intravenous contrast. COMPARISON:  Prior MRI from earlier the same day. FINDINGS: Anterior circulation: Examination moderately degraded by motion artifact. Visualized distal cervical segments of the internal carotid arteries are patent with antegrade flow. Partially visualized cervical left ICA tortuous. Petrous segments patent bilaterally. Cavernous and supraclinoid right ICA widely patent without stenosis. 4 mm saccular outpouching arising from the cavernous right ICA consistent with a small paraophthalmic aneurysm (series 9, image 119). Atheromatous change within the left carotid siphon with associated mild-to-moderate stenosis (series 9, image 119). Left A1 widely patent. Right A1 hypoplastic and/or absent. Normal anterior communicating artery complex. Anterior cerebral arteries grossly patent to their distal aspects without stenosis. Left M1 patent. Grossly normal left MCA bifurcation. Distal left MCA branches perfused. Right M1 segment. There remains a patent small anterior right temporal branch. 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 Posterior circulation: Dominant left vertebral artery patent to the vertebrobasilar junction without stenosis. Left PICA patent. Right vertebral artery largely terminates at the level of the right PICA, although a tiny branch ascending towards the vertebrobasilar junction. Right PICA patent as well. Basilar mildly diminutive but patent to its distal aspect without stenosis. Superior cerebral arteries patent bilaterally. Fetal type origin of the PCAs bilaterally. PCAs remain well perfused to their distal aspects without obvious proximal high-grade stenosis. Anatomic variants: Fetal type origin of the PCAs. Dominant left vertebral artery.  Hypoplastic/absent right A1 segment, with the anterior cerebral artery supplied via the left carotid artery system. Other: None. IMPRESSION: 1.  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 2. Atheromatous change within the left carotid siphon with associated mild-to-moderate stenosis. 3. 4 mm cavernous right ICA aneurysm. 4. Fetal type origin of the PCAs bilaterally. Hypoplastic/absent right A1, with the anterior cerebral arteries supplied primarily via the left carotid artery system. Electronically Signed   By: Jeannine Boga M.D.   On: 02/25/2021 23:44   MR BRAIN WO CONTRAST  Result Date: 02/25/2021 CLINICAL DATA:  Stroke suspected EXAM: MRI HEAD WITHOUT CONTRAST TECHNIQUE: Multiplanar, multiecho pulse sequences of the brain and surrounding structures were obtained without intravenous contrast. COMPARISON:  09/12/2020 MRI, correlation is also made with CT head 02/25/2021. FINDINGS: Evaluation somewhat limited by motion artifact. Brain: Small area of cortical restricted diffusion in the right parietal lobe (series 5, images 33-40), within area of encephalomalacia from prior right MCA territory infarct. No acute hemorrhage, mass, mass effect, or midline shift. No extra-axial fluid collection. Redemonstrated chronic microhemorrhages in the bilateral deep gray nuclei. Confluent T2 hyperintense signal in the periventricular white matter and pons, likely the sequela of here chronic small vessel ischemic disease. Unchanged ex vacuo dilatation of the right lateral ventricle. Vascular: 08/21/2019 Skull and upper cervical spine: Normal marrow signal. Sinuses/Orbits: Status post bilateral lens replacements. Otherwise negative. Other: Trace fluid in right mastoid air cells. IMPRESSION: Evaluation is somewhat limited by motion artifact. Within this limitation, there is acute ischemia within an area of remote infarct in the right parietal lobe. No evidence of hemorrhagic transformation. These results were called by telephone at the time of interpretation on 02/25/2021 at 8:16 am to provider Brianna Dodson , who verbally acknowledged  these results. Electronically Signed   By: Merilyn Baba M.D.   On: 02/25/2021 11:18   US Carotid Bilateral  Result Date: 02/26/2021 CLINICAL DATA:  Stroke symptoms, syncope hyperlipidemia, diabetes EXAM: BILATERAL CAROTID DUPLEX ULTRASOUND TECHNIQUE: Pearline Cables scale imaging, color Doppler and duplex ultrasound were performed of bilateral carotid and vertebral arteries in the neck. COMPARISON:  None. FINDINGS: Criteria: Quantification of carotid stenosis is based on velocity parameters that correlate the residual internal carotid diameter with NASCET-based stenosis levels, using the diameter of the distal internal carotid lumen as the denominator for stenosis measurement. The following velocity measurements were obtained: RIGHT ICA: 76/12 cm/sec CCA: 31/5 cm/sec SYSTOLIC ICA/CCA RATIO:  1.4 ECA: 51 cm/sec LEFT ICA: 86/19 cm/sec CCA: 40/0 cm/sec SYSTOLIC ICA/CCA RATIO:  1.3 ECA: 161 cm/sec RIGHT CAROTID ARTERY: Scattered mild-to-moderate echogenic shadowing plaque formation. No hemodynamically significant right ICA stenosis, velocity elevation, or turbulent flow. Degree of narrowing less than 50%. RIGHT VERTEBRAL ARTERY:  Normal antegrade flow LEFT CAROTID ARTERY: Similar scattered moderate echogenic plaque formation. No hemodynamically significant left ICA stenosis, velocity elevation, or turbulent flow. LEFT VERTEBRAL ARTERY:  Normal antegrade flow IMPRESSION: Moderate bilateral carotid atherosclerosis. Negative for significant stenosis. Degree of narrowing less than 50% bilaterally by ultrasound criteria. Patent antegrade vertebral flow bilaterally Electronically Signed   By: Jerilynn Mages.  Shick M.D.   On: 02/26/2021 07:26     Medications:    sodium chloride     sodium chloride      Chlorhexidine Gluconate Cloth  6 each Topical Q0600   gabapentin  100 mg Oral QHS   insulin aspart  0-5 Units Subcutaneous QHS   insulin aspart  0-9 Units Subcutaneous TID WC    Assessment/ Plan:  Principal Problem:   Stroke  [facility]) Active Problems:   Essential hypertension   HLD (hyperlipidemia)   Type II diabetes mellitus with renal manifestations (HCC)   Anemia in ESRD (end-stage renal disease) (HCC)   Atrial fibrillation (HCC)   End stage renal disease on dialysis (HCC)   Left arm pain   Thrombocytopenia (HCC)   Renal dialysis device, implant, or graft complication  Brianna Dodson is a 87 y.o.  female with past medical history of atrial fib, anemia, hypertension, hyperlipidemia, diabetes, stroke, and ESRD on dialysis. Patient presents to ED with complaints of left arm pain.    [facility] [address]/MWF/Rt Permcath   Hypertension  Home regimen includes amlodipine, carvedilol, and hydralazine. All held at this time   2. End stage renal disease on dialysis Will maintain outpatient schedule if possible Had stable treatment on Friday. Next treatment on Monday   3. Anemia of chronic kidney disease Recent Labs       Lab Results  Component Value Date    HGB 10.5 (L) 02/25/2021    Hgb at goal   4. Secondary Hyperparathyroidism: continue to monitor  5. Left arm pain after AVG placement. Possible graft ligation  6. CVA. PT and OT. Continue present care.     LOS: 0 Lyla Son, MD [facility] 8/27/20223:04 PM

## 2021-02-26 NOTE — TOC Initial Note (Signed)
Transition of Care [facility]) - Initial/Assessment Note    Patient Details  Name: Brianna Dodson MRN: [id number] Date of Birth: June 21, 1934  Transition of Care [facility]) CM/SW Contact:    Shelbie Hutching, RN Phone Number: 02/26/2021, 3:09 PM  Clinical Narrative:                 Patient admitted to the hospital with stroke, patient just had AV Graft placed left upper arm on 8/25.  Patient is a dialysis patient, she reports that she goes to [facility] on [address] MWF.  RNCM met with patient at the bedside.  Patient lives with her husband and granddaughter in 2 story home.  Granddaughter helps her at home.  Patient walks with a cane and she aso has a walker.   PT is recommending HH and OT is recommending SNF, patient is open with [facility] for home health services, Joelene Millin with [name] is aware of admission.    RNCM was able to speak with patient's sister, Brianna Dodson and she reports that she and her granddaughter would like to see the patient go for rehab.  She prefers [facility]  RNCM will start SNF workup.   Expected Discharge Plan: [place] Barriers to Discharge: Continued Medical Work up   Patient Goals and CMS Choice   CMS Medicare.gov Compare Post Acute Care list provided to:: Patient Choice offered to / list presented to : Patient, Spouse  Expected Discharge Plan and Services Expected Discharge Plan: [place]   Discharge Planning Services: CM Consult Post Acute Care Choice: Home Health, Resumption of Svcs/PTA Provider Living arrangements for the past 2 months: Single Family Home                 DME Arranged: N/A DME Agency: NA       HH Arranged: RN, PT, OT, Nurse's Aide HH Agency: [place] Date HH Agency Contacted: 02/26/21 Time HH Agency Contacted: 74 Representative spoke with at [place]: Joelene Millin  Prior Living Arrangements/Services Living arrangements for the past 2 months: [place] Lives with:: Spouse, Relatives  (granddaughter) Patient language and need for interpreter reviewed:: Yes Do you feel safe going back to the place where you live?: Yes      Need for Family Participation in Patient Care: Yes (Comment) Care giver support system in place?: Yes (comment) Current home services: DME, Home PT, Home RN (Cane and walker) Criminal Activity/Legal Involvement Pertinent to Current Situation/Hospitalization: No - Comment as needed  Activities of Daily Living      Permission Sought/Granted Permission sought to share information with : Case Manager, Family Supports, Other (comment) Permission granted to share information with : Yes, Verbal Permission Granted  Share Information with NAME: Ewell Poe, [place] granted to share info w AGENCY: [phone]  Permission granted to share info w Relationship: sister, husband, daughter     Emotional Assessment Appearance:: Appears stated age Attitude/Demeanor/Rapport: Engaged   Orientation: : Oriented to Self, Oriented to Place Alcohol / Substance Use: Not Applicable Psych Involvement: No (comment)  Admission diagnosis:  Stroke [facility]) [I63.9] Pain of left upper arm [M79.622] Cerebrovascular accident (CVA), unspecified mechanism ([place]) [I63.9] Patient Active Problem List   Diagnosis Date Noted   Renal dialysis device, implant, or graft complication    Stroke ([place]) 02/25/2021   Left arm pain 02/25/2021   Thrombocytopenia ([place]) 02/25/2021   Other disorders of phosphorus metabolism 01/12/2021   Cerebral infarction, unspecified ([place]) 10/12/2020   Coagulation defect,  unspecified ([place]) 10/12/2020   Diarrhea, unspecified 10/12/2020   Encounter for immunization 10/12/2020   End stage renal disease on dialysis ([place]) 10/12/2020   Fever, unspecified 10/12/2020   Headache, unspecified 10/12/2020   Iron deficiency anemia, unspecified 10/12/2020   Localized edema 10/12/2020   Nontoxic single thyroid nodule 10/12/2020   Occlusion and stenosis of  unspecified carotid artery 10/12/2020   Other fluid overload 10/12/2020   Pain, unspecified 10/12/2020   Shortness of breath 10/12/2020   Vascular dementia without behavioral disturbance ([place]) 10/12/2020   Malnutrition of moderate degree 09/13/2020   TIA (transient ischemic attack) 09/10/2020   Anemia of chronic disease 09/10/2020   CVA (cerebral vascular accident) ([place]) 09/10/2020   Sleeping difficulty 07/08/2020   Fatigue 06/09/2020   Mixed Alzheimer's and vascular dementia (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, or unspecified 04/07/2019   Proteinuria 04/07/2019   History of stroke with current residual effects 03/15/2019   Hyperlipidemia associated with type 2 diabetes mellitus ([place]) 07/25/2018   Itching 10/23/2017   [place]evus 04/17/2017   CKD (chronic kidney disease), stage V ([place]) 02/02/2015   Vitamin D deficiency 02/02/2015   Obesity (BMI 30-39.9) 08/03/2014   Type II diabetes mellitus with renal manifestations ([place]) 01/29/2014   Essential hypertension 01/13/2014   HLD (hyperlipidemia) 01/13/2014   PCP:  [name], MD Pharmacy:   [id number] [id number]: [phone]: [phone]    Social Determinants of Health (SDOH) Interventions    Readmission Risk  Interventions Readmission Risk Prevention Plan 03/16/2019  Transportation Screening Complete  PCP or Specialist Appt within 5-7 Days Complete  Home Care Screening Complete  Medication Review (RN CM) Complete  Some recent data might be hidden

## 2021-02-26 NOTE — Plan of Care (Signed)
Care plan reviewed with patient.

## 2021-02-26 NOTE — Evaluation (Addendum)
Speech Language Pathology Evaluation Patient Details Name: Brianna Dodson MRN: [id number] DOB: 1934/05/09 Today's Date: 02/26/2021 Time: 0920-0950 SLP Time Calculation (min) (ACUTE ONLY): 30 min  Problem List:  Patient Active Problem List   Diagnosis Date Noted   Renal dialysis device, implant, or graft complication    Stroke ([place]) 02/25/2021   Left arm pain 02/25/2021   Thrombocytopenia ([place]) 02/25/2021   Other disorders of phosphorus metabolism 01/12/2021   Cerebral infarction, unspecified ([place]) 10/12/2020   Coagulation defect, unspecified ([place]) 10/12/2020   Diarrhea, unspecified 10/12/2020   Encounter for immunization 10/12/2020   End stage renal disease on dialysis ([place]) 10/12/2020   Fever, unspecified 10/12/2020   Headache, unspecified 10/12/2020   Iron deficiency anemia, unspecified 10/12/2020   Localized edema 10/12/2020   Nontoxic single thyroid nodule 10/12/2020   Occlusion and stenosis of unspecified carotid artery 10/12/2020   Other fluid overload 10/12/2020   Pain, unspecified 10/12/2020   Shortness of breath 10/12/2020   Vascular dementia without behavioral disturbance ([place]) 10/12/2020   Malnutrition of moderate degree 09/13/2020   TIA (transient ischemic attack) 09/10/2020   Anemia of chronic disease 09/10/2020   CVA (cerebral vascular accident) ([place]) 09/10/2020   Sleeping difficulty 07/08/2020   Fatigue 06/09/2020   Mixed Alzheimer's and vascular dementia ([place]) 06/09/2020   Coccydynia 01/26/2020   Leukopenia 12/08/2019   Muscle strain 12/08/2019   Pedal edema 11/26/2019   Dysarthria and anarthria 11/07/2019   Atrial fibrillation ([place]) 10/21/2019   Nocturia 10/21/2019   Seborrheic keratosis 10/21/2019   Carotid stenosis 08/27/2019   Thyroid nodule 08/27/2019   Status post placement of implantable loop recorder 08/07/2019   Anemia in ESRD (end-stage renal disease) ([place]) 04/18/2019   Aphasia 04/12/2019   Benign hypertensive renal disease 04/07/2019    Hyperparathyroidism due to renal insufficiency ([place]) 04/07/2019   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, or unspecified 04/07/2019   Proteinuria 04/07/2019   History of stroke with current residual effects 03/15/2019   Hyperlipidemia associated with type 2 diabetes mellitus ([place]) 07/25/2018   Itching 10/23/2017   Nevus 04/17/2017   CKD (chronic kidney disease), stage V ([place]) 02/02/2015   Vitamin D deficiency 02/02/2015   Obesity (BMI 30-39.9) 08/03/2014   Type II diabetes mellitus with renal manifestations ([place]) 01/29/2014   Essential hypertension 01/13/2014   HLD (hyperlipidemia) 01/13/2014   Past Medical History:  Past Medical History:  Diagnosis Date   A-fib ([place])    Alzheimer disease ([place])    Anemia    Carotid stenosis    Cerebral infarction ([place]) 04/12/2019   Multiple new foci of acute infarction affect the RIGHT hemisphere affecting the frontal, posterior frontal, posterior temporal, anterior parietal cortex and regional white matter.   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/2019   RIGHT MCA distal M1   Chronic anticoagulation    Apixaban   CKD (chronic kidney disease), stage 4(HCC)    Embolic stroke involving cerebral artery ([place]) 03/15/2019   RIGHT MCA/PCA territory   Encephalomalacia    RIGHT posterior MCA territory   HCV (hepatitis C virus)    History of blood transfusion    Hyperlipidemia    Hypertension    OSA (obstructive sleep apnea)    Status post placement of implantable loop recorder 07/30/2019   T2DM (type 2 diabetes mellitus) [facility])    Past Surgical History:  Past Surgical History:  Procedure Laterality Date   ABDOMINAL HYSTERECTOMY  07/04/1983   AV FISTULA  PLACEMENT Left 12/02/2020   Procedure: INSERTION OF ARTERIOVENOUS (AV)  FISTULA  ARM ( BRACHIO- CEPHALIC);  Surgeon: Algernon Huxley, MD;  Location: [facility] ORS;  Service: Vascular;  Laterality: Left;   AV FISTULA  PLACEMENT Left 02/24/2021   Procedure: INSERTION OF ARTERIOVENOUS (AV) GORE-TEX GRAFT ARM;  Surgeon: Algernon Huxley, MD;  Location: [facility] ORS;  Service: Vascular;  Laterality: Left;  Left brachial Ax graft   CATARACT EXTRACTION, BILATERAL Bilateral    DIALYSIS/PERMA CATHETER INSERTION N/A 10/06/2020   Procedure: DIALYSIS/PERMA CATHETER INSERTION;  Surgeon: Algernon Huxley, MD;  Location: [place] [facility];  Service: Cardiovascular;  Laterality: N/A;   LOOP RECORDER INSERTION N/A 07/30/2019   Procedure: LOOP RECORDER INSERTION;  Surgeon: Isaias Cowman, MD;  Location: [place] [facility];  Service: Cardiovascular;  Laterality: N/A;   TEE WITHOUT CARDIOVERSION N/A 04/14/2019   Procedure: TRANSESOPHAGEAL ECHOCARDIOGRAM (TEE);  Surgeon: Teodoro Spray, MD;  Location: [facility] ORS;  Service: Cardiovascular;  Laterality: N/A;   HPI:  Pt is a 87 y.o. female with medical history significant for MULTIPLE medical dxs including Dementia - mixed Vascular and Alzheimer's kind per Neurology this admit, HTN, HLD, DMT2, previous CVA, CKD 4 w/ encephalomalacia of posterior right MCA/ACA territory, generally in keeping with expected evolution of acute diffusion restricting infarction identified on priors dated 08/20/2019 per chart notes.  Pt admitted to the ED for pain worsening left-sided weakness after left upper extremity AV fistula surgery on 8/25 w/ Eliquis held. She had returned home post procedure and was initially able to use her arm some but then around 9 PM it started to feel like that the whole arm had gone dead.  She has extremely weak grip strength.  Per Neurology, Imaging suggests cortical laminar necrosis vs a new stroke at this time.    Assessment / Plan / Recommendation Clinical Impression  Patient presents with mild-moderate dysarthria due to Left sided facial weakness, decreased awareness of deficits and how they impact function, impulsivity, and decreased insight and memory of deficits/strategies  to self-corrent. Pt has Baseline Dysarthria and Cognitive deficits as per Speech notes in chart(when seen by ST services in May of 2021 following previous CVA and in March of 2022 in which [place] therapy was recommended then) and per Neurology notes indicating Cognitive deficits "significant for Dementia - mixed vascular and Alzheimer's kind".  Neurologist confirmed this per discussion post this session today. Pt does exhibit decreased mental flexibility and decreased insight and awareness of her deficits. She did note that her speech improves when she wears her Upper Denture plate(when asked about it). No caregiver present to confirm. When pt utilized strategies of slowing rate of speech, emphasizing speech sounds, (Louder too), pt's Dysarthria lessened and articulation of speech improved. Education and practice of articulation strategies was completed w/ pt w/ focus on over-articulation w/ Big, Loud speech. Unsure of pt's carry-over of new information w/out cues from Family. Pt presented w/ mild Left labial oral motor weakness; dcreased Left labial tone. No grossly apparent expressive or receptive aphasia noted -- though Cognitive decline/deficits do most often impact language abilities. Post dischage, pt/family could explore home health therapy again if indicated to maximizing communication in ADLs as independently as possible.    SLP Assessment  SLP Recommendation/Assessment: All further Speech Lanaguage Pathology  needs can be addressed in the next venue of care (if indicated by family/pt/MD d/t h/o Baseline deficits) SLP Visit Diagnosis: Cognitive communication deficit (R41.841)    Follow Up Recommendations  None (at  this time)    Frequency and Duration  (n/a)   (n/a)      SLP Evaluation Cognition  Overall Cognitive Status: History of cognitive impairments - at baseline Arousal/Alertness: Awake/alert Orientation Level: Oriented to person;Oriented to place Attention:  Focused;Sustained Focused Attention:  (distracted) Sustained Attention:  (distracted) Memory: Impaired (cues needed) Awareness: Impaired Awareness Impairment: Anticipatory impairment Problem Solving: Impaired Problem Solving Impairment: Verbal complex;Functional complex Safety/Judgment: Impaired Comments: reduced mental flexibility       Comprehension  Auditory Comprehension Overall Auditory Comprehension: Appears within functional limits for tasks assessed (grossly functional) Yes/No Questions: Within Functional Limits Commands: Within Functional Limits (1-2 step) Conversation: Simple Interfering Components: Attention EffectiveTechniques: Repetition Visual Recognition/Discrimination Discrimination: Not tested    Expression Expression Primary Mode of Expression: Verbal Verbal Expression Overall Verbal Expression: Appears within functional limits for tasks assessed (grossly functional) Initiation: No impairment Automatic Speech: Social Response;Name Level of Generative/Spontaneous Verbalization: Engineer, mining: Impairment (reduced mental flexibility) Interfering Components: Attention;Speech intelligibility (mild dysarthria) Non-Verbal Means of Communication: Not applicable Written Expression Dominant Hand: Right Written Expression: Not tested   Oral / Motor  Oral Motor/Sensory Function Overall Oral Motor/Sensory Function: Mild impairment (Impaired at Baseline; slight-mild -- also not wearing Dentures.) Facial ROM: Reduced left Facial Symmetry: Abnormal symmetry left Lingual ROM: Within Functional Limits Lingual Symmetry: Within Functional Limits Lingual Strength: Within Functional Limits Motor Speech Overall Motor Speech: Impaired at baseline Respiration: Within functional limits Phonation: Normal Resonance: Within functional limits Articulation: Impaired (min+) Level of Impairment: Word Intelligibility: Intelligibility reduced Motor Speech Errors: Not  applicable Interfering Components: Inadequate dentition Effective Techniques: Slow rate;Increased vocal intensity;Over-articulate   GO                      Orinda Kenner, MS, CCC-SLP Speech Language Pathologist Rehab Services [phone] [facility] 02/26/2021, 12:22 PM

## 2021-02-26 NOTE — Anesthesia Preprocedure Evaluation (Signed)
Anesthesia Evaluation  Patient identified by MRN, date of birth, ID band Patient awake    Reviewed: Allergy & Precautions, H&P , NPO status , Patient's Chart, lab work & pertinent test results, reviewed documented beta blocker date and time   History of Anesthesia Complications Negative for: history of anesthetic complications  Airway Mallampati: II  TM Distance: >3 FB Neck ROM: limited    Dental  (+) Edentulous Upper, Chipped, Missing   Pulmonary shortness of breath, sleep apnea , neg COPD, Patient abstained from smoking.Not current smoker,    Pulmonary exam normal breath sounds clear to auscultation       Cardiovascular Exercise Tolerance: Poor METShypertension, Pt. on medications and Pt. on home beta blockers (-) CAD and (-) Past MI Normal cardiovascular exam(-) dysrhythmias  Rhythm:Regular Rate:Normal     Neuro/Psych  Headaches, PSYCHIATRIC DISORDERS (Pt Alert. oriented, answers questions appropriately) Dementia L arm weakness, speech residual TIA Neuromuscular disease CVA, Residual Symptoms    GI/Hepatic neg GERD  ,(+)     (-) substance abuse  , Hepatitis -, C  Endo/Other  diabetes  Renal/GU Dialysis and ESRFRenal diseaseLast dialyzed yesterday     Musculoskeletal   Abdominal   Peds  Hematology  (+) Blood dyscrasia (Eliquis Held 3 days), anemia ,   Anesthesia Other Findings A-fib ([place])    Alzheimer disease ([place])   Anemia    Carotid stenosis    Cerebral infarction ([place]) 04/12/2019 Multiple new foci of acute infarction affect the RIGHT hemisphere affecting the frontal, posterior frontal, posterior temporal, anterior parietal cortex and regional white matter.  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/20/2019 RIGHT MCA distal M1  Chronic anticoagulation  Apixaban  CKD (chronic kidney disease), stage 4(HCC)    Embolic stroke involving cerebral artery ([place]) 03/15/2019 RIGHT  MCA/PCA territory  Encephalomalacia  RIGHT posterior MCA territory  HCV (hepatitis C virus)   History of blood transfusion   Hyperlipidemia    Hypertension    OSA (obstructive sleep apnea)  Status post placement of implantable loop recorder 07/30/2019  T2DM (type 2 diabetes mellitus) ([place])       Reproductive/Obstetrics                             Anesthesia Physical  Anesthesia Plan  ASA: 4  Anesthesia Plan: MAC   Post-op Pain Management:    Induction: Intravenous  PONV Risk Score and Plan: 2 and Treatment may vary due to age or medical condition, Propofol infusion, TIVA and Ondansetron  Airway Management Planned: Nasal Cannula and Natural Airway  Additional Equipment: None  Intra-op Plan:   Post-operative Plan:   Informed Consent: I have reviewed the patients History and Physical, chart, labs and discussed the procedure including the risks, benefits and alternatives for the proposed anesthesia with the patient or authorized representative who has indicated his/her understanding and acceptance.     Dental Advisory Given  Plan Discussed with: CRNA, Anesthesiologist and Surgeon  Anesthesia Plan Comments: (Discussed risks of anesthesia with patient, including possibility of difficulty with spontaneous ventilation under anesthesia necessitating airway intervention, PONV, and rare risks such as cardiac or respiratory or neurological events, and allergic reactions. Patient understands.)        Anesthesia Quick Evaluation

## 2021-02-26 NOTE — Progress Notes (Signed)
PROGRESS NOTE    Brianna Dodson  UYQ:[id number] DOB: 08/07/1933 DOA: 02/25/2021 PCP: [name], MD   Brief Narrative:  Brianna Dodson is a 87 y.o. female with medical history significant of ESRD-HD (MWF), hypertension, hyperlipidemia, diabetes mellitus,'s embolic stroke, atrial fibrillation on Eliquis, depression, OSA, HCV, anemia, Alzheimer's disease, who presents with left arm pain and weakness. Brianna Dodson had left arm AV graft placement procedure 8/25 with vascular surgery.  After she went home, she developed severe pain in the left arm, which is constant, sharp, throbbing-like, nonradiating.  Patient also reports weakness in left forearm, particularly in the left wrist and hand. She has been off Eliquis from 8/22-8/25 for surgical procedure.  She has not restarted Eliquis yet. In ED MRI showed possible new stroke. Brianna Dodson is placed in med-surg bed for obs. Dr. [name] of VVS, Dr. Juleen China of renal, Dr. Rory Percy of neuro are consulted.  Assessment & Plan:   Acute right parietal lobe infarct, POA Neurology and Vascular following - appreciate insight and recs Continue neuro checks, defer repeat imaging per consults Continue to hold Eliquis given below procedure A1C 6.8 Lipid panel WNL Brianna Dodson/OT consult  Left arm pain after AV graft placement:  -Vascular surgery following, appreciate insight and recommendations  -Concern for steal syndrome given imaging -Tentative plan for ligation of the AV graft to improve perfusion per vascular  Essential hypertension -IV hydralazine as needed -Amlodipine, Coreg, hydralazine   ESRD-HD: -Nephrology consulted for dialysis   HLD (hyperlipidemia) -Lipitor and Zetia   Type II diabetes mellitus with renal manifestations ([place]):  Recent A1c 6.8, borderline controlled.  Patient is taking Tradjenta -Sliding scale insulin   Anemia in ESRD (end-stage renal disease) ([place]): Hemoglobin stable, 10.5 -Continue iron supplement   Atrial fibrillation ([place]) -Coreg -Hold  Eliquis in the setting of procedure as above    Thrombocytopenia ([place]):  - This is chronic issue.  Hemoglobin 115.  No bleeding tendency. - Follow-up with CBC   DVT prophylaxis: SCDs only, hold anticoagulation given above Code Status: Partial, DO NOT INTUBATE Family Communication: None present  Status is: Inpatient  Dispo: The patient is from: Home              Anticipated d/c is to: Home              Anticipated d/c date is: 40 to 72 hours              Patient currently not medically stable for discharge  Consultants:  Nephrology, neuro, vascular surgery  Procedures:  Tentative plan for graft ligation  Antimicrobials:  None indicated  Subjective: No acute issues or events overnight denies nausea vomiting diarrhea constipation headache fevers chills or chest pain  Objective: Vitals:   02/25/21 2100 02/26/21 0200 02/26/21 0300 02/26/21 0536  BP:    (!) 154/62  Pulse:    72  Resp: 19 18 18 16   Temp:    98.2 F (36.8 C)  TempSrc:      SpO2:    97%  Weight:      Height:        Intake/Output Summary (Last 24 hours) at 02/26/2021 0737 Last data filed at 02/25/2021 2100 Gross per 24 hour  Intake 120 ml  Output 0 ml  Net 120 ml   Filed Weights   02/25/21 0816 02/25/21 1756  Weight: 65.3 kg 68.4 kg    Examination:  General:  Pleasantly resting in bed, No acute distress. HEENT:  Normocephalic atraumatic.  Sclerae nonicteric, noninjected.  Extraocular  movements intact bilaterally. Neck:  Without mass or deformity.  Trachea is midline. Lungs:  Clear to auscultate bilaterally without rhonchi, wheeze, or rales. Heart:  Regular rate and rhythm.  Without murmurs, rubs, or gallops. Abdomen:  Soft, nontender, nondistended.  Without guarding or rebound. Extremities: Left arm bandage clean dry intact sling in place. Vascular:  Dorsalis pedis and posterior tibial pulses palpable bilaterally. Skin:  Warm and dry, no erythema, no ulcerations.   Data Reviewed: I have  personally reviewed following labs and imaging studies  CBC: Recent Labs  Lab 02/24/21 1141 02/24/21 1153 02/25/21 0841  WBC 2.8*  --  6.9  NEUTROABS  --   --  5.6  HGB 10.0* 9.2* 10.5*  HCT 28.2* 27.0* 29.7*  MCV 88.4  --  88.1  PLT 111*  --  244*   Basic Metabolic Panel: Recent Labs  Lab 02/24/21 1153 02/25/21 0841  NA 141 139  K 3.6 3.6  CL 97* 97*  CO2  --  32  GLUCOSE 156* 225*  BUN 20 27*  CREATININE 3.00* 3.40*  CALCIUM  --  9.2   GFR: Estimated Creatinine Clearance: 11.8 mL/min (A) (by C-G formula based on SCr of 3.4 mg/dL (H)). Liver Function Tests: No results for input(s): AST, ALT, ALKPHOS, BILITOT, PROT, ALBUMIN in the last 168 hours. No results for input(s): LIPASE, AMYLASE in the last 168 hours. No results for input(s): AMMONIA in the last 168 hours. Coagulation Profile: No results for input(s): INR, PROTIME in the last 168 hours. Cardiac Enzymes: No results for input(s): CKTOTAL, CKMB, CKMBINDEX, TROPONINI in the last 168 hours. BNP (last 3 results) No results for input(s): PROBNP in the last 8760 hours. HbA1C: No results for input(s): HGBA1C in the last 72 hours. CBG: Recent Labs  Lab 02/24/21 1525 02/25/21 2316  GLUCAP 122* 128*   Lipid Profile: Recent Labs    02/26/21 0502  CHOL 110  HDL 47  LDLCALC 49  TRIG 68  CHOLHDL 2.3   Thyroid Function Tests: No results for input(s): TSH, T4TOTAL, FREET4, T3FREE, THYROIDAB in the last 72 hours. Anemia Panel: No results for input(s): VITAMINB12, FOLATE, FERRITIN, TIBC, IRON, RETICCTPCT in the last 72 hours. Sepsis Labs: No results for input(s): PROCALCITON, LATICACIDVEN in the last 168 hours.  Recent Results (from the past 240 hour(s))  Resp Panel by RT-PCR (Flu A&B, Covid) Nasopharyngeal Swab     Status: None   Collection Time: 02/25/21 12:01 PM   Specimen: Nasopharyngeal Swab; Nasopharyngeal(NP) swabs in vial transport medium  Result Value Ref Range Status   SARS Coronavirus 2 by RT PCR  NEGATIVE NEGATIVE Final    Comment: (NOTE) SARS-CoV-2 target nucleic acids are NOT DETECTED.  The SARS-CoV-2 RNA is generally detectable in upper respiratory specimens during the acute phase of infection. The lowest concentration of SARS-CoV-2 viral copies this assay can detect is 138 copies/mL. A negative result does not preclude SARS-Cov-2 infection and should not be used as the sole basis for treatment or other patient management decisions. A negative result may occur with  improper specimen collection/handling, submission of specimen other than nasopharyngeal swab, presence of viral mutation(s) within the areas targeted by this assay, and inadequate number of viral copies(<138 copies/mL). A negative result must be combined with clinical observations, patient history, and epidemiological information. The expected result is Negative.  Fact Sheet for Patients:  EntrepreneurPulse.com.au  Fact Sheet for Healthcare Providers:  IncredibleEmployment.be  This test is no t yet approved or cleared by the [place] FDA  and  has been authorized for detection and/or diagnosis of SARS-CoV-2 by FDA under an Emergency Use Authorization (EUA). This EUA will remain  in effect (meaning this test can be used) for the duration of the COVID-19 declaration under Section 564(b)(1) of the Act, 21 U.S.C.section 360bbb-3(b)(1), unless the authorization is terminated  or revoked sooner.       Influenza A by PCR NEGATIVE NEGATIVE Final   Influenza B by PCR NEGATIVE NEGATIVE Final    Comment: (NOTE) The Xpert Xpress SARS-CoV-2/FLU/RSV plus assay is intended as an aid in the diagnosis of influenza from Nasopharyngeal swab specimens and should not be used as a sole basis for treatment. Nasal washings and aspirates are unacceptable for Xpert Xpress SARS-CoV-2/FLU/RSV testing.  Fact Sheet for Patients: EntrepreneurPulse.com.au  Fact Sheet for  Healthcare Providers: IncredibleEmployment.be  This test is not yet approved or cleared by the [place] FDA and has been authorized for detection and/or diagnosis of SARS-CoV-2 by FDA under an Emergency Use Authorization (EUA). This EUA will remain in effect (meaning this test can be used) for the duration of the COVID-19 declaration under Section 564(b)(1) of the Act, 21 U.S.C. section 360bbb-3(b)(1), unless the authorization is terminated or revoked.  Performed at [facility], [address] [place], [place] [address]          Radiology Studies: CT HEAD WO CONTRAST (5MM)  Result Date: 02/25/2021 CLINICAL DATA:  87 year old female with dizziness. Acute on chronic right MCA infarct in March. Dialysis patient with pain in the region of left arm AV fistula. EXAM: CT HEAD WITHOUT CONTRAST TECHNIQUE: Contiguous axial images were obtained from the base of the skull through the vertex without intravenous contrast. COMPARISON:  Brain MRI 09/12/2020.  Head CT 09/10/2020. FINDINGS: Brain: Right MCA territory encephalomalacia. Mild ex vacuo enlargement of the ventricles. Patchy and confluent additional bilateral cerebral white matter hypodensity. No midline shift, ventriculomegaly, mass effect, evidence of mass lesion, intracranial hemorrhage or evidence of cortically based acute infarction. Vascular: Extensive Calcified atherosclerosis at the skull base. Right MCA calcified atherosclerosis. No suspicious intracranial vascular hyperdensity. Skull: No acute osseous abnormality identified. Sinuses/Orbits: Continued bubbly opacity in the right sphenoid sinus. Other Visualized paranasal sinuses and mastoids are clear. Other: No acute orbit or scalp soft tissue finding. IMPRESSION: 1. Chronic right MCA territory infarct and advanced chronic small vessel disease. 2. No acute intracranial abnormality. Electronically Signed   By: Genevie Ann M.D.   On: 02/25/2021 10:16   CT  ANGIO UP EXTREM LEFT W &/OR WO CONTAST  Result Date: 02/25/2021 CLINICAL DATA:  87 year old female with a murmur at the site of AV fistula EXAM: CT ANGIOGRAPHY UPPER LEFT EXTREMITY TECHNIQUE: Axial spiral CT images were acquired of the left upper extremity after administration of a standard IV contrast bolus. The images were acquired with timing specific for the arterial system. Axial and coronal images were performed on a separate workstation CONTRAST:  21mL OMNIPAQUE IOHEXOL 350 MG/ML SOLN COMPARISON:  None. FINDINGS: VASCULAR: Axillary artery is patent with mild atherosclerosis and no high-grade stenosis or occlusion. The humeral circumflex branches remain patent. Brachial artery is patent without significant atherosclerosis throughout the upper arm. Surgical changes of arteriovenous fistula/dialysis circuit of the upper arm. The anastomosis is patent, with patent venous outflow through the brachial vein, axillary vein, and distal subclavian vein. Distal to the anastomosis the brachial artery remains patent to the antecubital region. Radial artery and the ulnar artery are patent to the distal forearm. There is decreased attenuation distally,  which may be secondary to late arrival of the contrast bolus. The radial artery is dominant compared to the ulnar artery. A small interosseous artery is present proximally, without opacification distally. Incidentally imaged left common femoral artery appears patent as well as the proximal profunda femoris and SFA. The pelvis is not imaged. Nonvascular: Gas within the soft tissues of the left upper arm adjacent to the fistula. No pooling of contrast or extravasation of contrast. Edema within the fat planes adjacent to the musculature of the upper arm without fluid collection. Degenerative changes of the glenohumeral joint, acromioclavicular joint. No acute displaced fracture. Review of the MIP images confirms the above findings. IMPRESSION: CT angiogram demonstrates a  patent left upper extremity arteriovenous dialysis circuit. If there is concern for hemodynamic assessment, directed duplex is a more sensitive and specific test, and should be considered if there is a suspicion of flow abnormality. Gas and edema within the soft tissues of the left upper arm adjacent to the dialysis circuit. While this is presumably related to usage/cannulation, soft tissue infection/cellulitis can not be excluded on CT. Signed, Dulcy Fanny. Dellia Nims, RPVI [facility]iology Electronically Signed   By: Corrie Mckusick D.O.   On: 02/25/2021 10:17   MR ANGIO HEAD WO CONTRAST  Result Date: 02/25/2021 CLINICAL DATA:  Follow-up examination for acute stroke. EXAM: MRA HEAD WITHOUT CONTRAST TECHNIQUE: Angiographic images of the Circle of Willis were acquired using MRA technique without intravenous contrast. COMPARISON:  Prior MRI from earlier the same day. FINDINGS: Anterior circulation: Examination moderately degraded by motion artifact. Visualized distal cervical segments of the internal carotid arteries are patent with antegrade flow. Partially visualized cervical left ICA tortuous. Petrous segments patent bilaterally. Cavernous and supraclinoid right ICA widely patent without stenosis. 4 mm saccular outpouching arising from the cavernous right ICA consistent with a small paraophthalmic aneurysm (series 9, image 119). Atheromatous change within the left carotid siphon with associated mild-to-moderate stenosis (series 9, image 119). Left A1 widely patent. Right A1 hypoplastic and/or absent. Normal anterior communicating artery complex. Anterior cerebral arteries grossly patent to their distal aspects without stenosis. Left M1 patent. Grossly normal left MCA bifurcation. Distal left MCA branches perfused. Right M1 segment. There remains a patent small anterior right temporal branch. Occlusion of the right MCA at the level of the bifurcation (series 9,  image 121), likely chronic. Posterior circulation: Dominant left vertebral artery patent to the vertebrobasilar junction without stenosis. Left PICA patent. Right vertebral artery largely terminates at the level of the right PICA, although a tiny branch ascending towards the vertebrobasilar junction. Right PICA patent as well. Basilar mildly diminutive but patent to its distal aspect without stenosis. Superior cerebral arteries patent bilaterally. Fetal type origin of the PCAs bilaterally. PCAs remain well perfused to their distal aspects without obvious proximal high-grade stenosis. Anatomic variants: Fetal type origin of the PCAs. Dominant left vertebral artery. Hypoplastic/absent right A1 segment, with the anterior cerebral artery supplied via the left carotid artery system. Other: None. IMPRESSION: 1. 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 2. Atheromatous change within the left carotid siphon with associated mild-to-moderate stenosis. 3. 4 mm cavernous right ICA aneurysm. 4. Fetal type origin of the PCAs bilaterally. Hypoplastic/absent right A1, with the anterior cerebral arteries supplied primarily via the left carotid artery system. Electronically Signed   By: Jeannine Boga M.D.   On: 02/25/2021 23:44   MR BRAIN WO CONTRAST  Result Date: 02/25/2021 CLINICAL DATA:  Stroke suspected EXAM: MRI HEAD WITHOUT CONTRAST TECHNIQUE: Multiplanar, multiecho pulse sequences of the brain and surrounding structures were obtained without intravenous contrast. COMPARISON:  09/12/2020 MRI, correlation is also made with CT head 02/25/2021. FINDINGS: Evaluation somewhat limited by motion artifact. Brain: Small area of cortical restricted diffusion in the right parietal lobe (series 5, images 33-40), within area of encephalomalacia from prior right MCA territory infarct. No acute hemorrhage, mass, mass effect, or midline shift. No extra-axial fluid collection. Redemonstrated chronic  microhemorrhages in the bilateral deep gray nuclei. Confluent T2 hyperintense signal in the periventricular white matter and pons, likely the sequela of here chronic small vessel ischemic disease. Unchanged ex vacuo dilatation of the right lateral ventricle. Vascular: 08/21/2019 Skull and upper cervical spine: Normal marrow signal. Sinuses/Orbits: Status post bilateral lens replacements. Otherwise negative. Other: Trace fluid in right mastoid air cells. IMPRESSION: Evaluation is somewhat limited by motion artifact. Within this limitation, there is acute ischemia within an area of remote infarct in the right parietal lobe. No evidence of hemorrhagic transformation. These results were called by telephone at the time of interpretation on 02/25/2021 at 8:16 am to provider [facility] , who verbally acknowledged these results. Electronically Signed   By: Merilyn Baba M.D.   On: 02/25/2021 11:18   US Carotid Bilateral  Result Date: 02/26/2021 CLINICAL DATA:  Stroke symptoms, syncope hyperlipidemia, diabetes EXAM: BILATERAL CAROTID DUPLEX ULTRASOUND TECHNIQUE: Pearline Cables scale imaging, color Doppler and duplex ultrasound were performed of bilateral carotid and vertebral arteries in the neck. COMPARISON:  None. FINDINGS: Criteria: Quantification of carotid stenosis is based on velocity parameters that correlate the residual internal carotid diameter with NASCET-based stenosis levels, using the diameter of the distal internal carotid lumen as the denominator for stenosis measurement. The following velocity measurements were obtained: RIGHT ICA: 76/12 cm/sec CCA: 28/0 cm/sec SYSTOLIC ICA/CCA RATIO:  1.4 ECA: 51 cm/sec LEFT ICA: 86/19 cm/sec CCA: 03/4 cm/sec SYSTOLIC ICA/CCA RATIO:  1.3 ECA: 161 cm/sec RIGHT CAROTID ARTERY: Scattered mild-to-moderate echogenic shadowing plaque formation. No hemodynamically significant right ICA stenosis, velocity elevation, or turbulent flow. Degree of narrowing less than 50%. RIGHT VERTEBRAL  ARTERY:  Normal antegrade flow LEFT CAROTID ARTERY: Similar scattered moderate echogenic plaque formation. No hemodynamically significant left ICA stenosis, velocity elevation, or turbulent flow. LEFT VERTEBRAL ARTERY:  Normal antegrade flow IMPRESSION: Moderate bilateral carotid atherosclerosis. Negative for significant stenosis. Degree of narrowing less than 50% bilaterally by ultrasound criteria. Patent antegrade vertebral flow bilaterally Electronically Signed   By: Jerilynn Mages.  Shick M.D.   On: 02/26/2021 07:26   [place] OR NERVE BLOCK-IMAGE ONLY [facility])  Result Date: 02/24/2021 There is no interpretation for this exam.  This order is for images obtained during a surgical procedure.  Please See "Surgeries" Tab for more information regarding the procedure.    Scheduled Meds:  Chlorhexidine Gluconate Cloth  6 each Topical Q0600   gabapentin  100 mg Oral QHS   insulin aspart  0-5 Units Subcutaneous QHS   insulin aspart  0-9 Units Subcutaneous TID WC   Continuous Infusions:  sodium chloride     sodium chloride       LOS: 0 days   Time spent: 8min  Yudit Modesitt C Shakedra Beam, DO [facility]  If 7PM-7AM, please contact night-coverage [email or web address]  02/26/2021, 7:37 AM

## 2021-02-26 NOTE — Progress Notes (Addendum)
Neurology Progress Note   S:// Seen and examined. No acute changes overnight  O:// Current vital signs: BP (!) 177/67 (BP Location: Right Arm)   Pulse 72   Temp (!) 97.4 F (36.3 C) (Oral)   Resp 18   Ht 5\' 5"  (1.651 m)   Wt 68.4 kg   SpO2 99%   BMI 25.09 kg/m  Vital signs in last 24 hours: Temp:  [97.4 F (36.3 C)-98.4 F (36.9 C)] 97.4 F (36.3 C) (08/27 0749) Pulse Rate:  [69-87] 72 (08/27 0749) Resp:  [14-19] 18 (08/27 0749) BP: (109-177)/(58-109) 177/67 (08/27 0749) SpO2:  [97 %-100 %] 99 % (08/27 0749) Weight:  [65.3 kg-68.4 kg] 68.4 kg (08/26 1756) General: Awake alert in distress due to pain on the left arm HEENT: Normocephalic/atraumatic CVs: RRR Respiratory: Breathing well saturating normally on room air Extremities: Left upper extremity in a sling Neurological exam Awake alert oriented x2  Speech is mild to moderately dysarthric Naming intact Comprehension intact Repetition intact Cranial nerves: Pupils equal round reactive to light, extraocular movements appear intact, subtle left facial weakness-unclear if its baseline, facial sensation intact. Motor examination with left upper extremity extremely tender to examined.  She has at least 4 -/5 left shoulder strength.  She is more weak distally in the hand on the left side with barely 1-2/5 strength left hand grip-which is different than her baseline she had recovered to at least a 4/5 strength there.  Right upper extremity is full strength. Left lower extremity 4+/5.  Right lower extremity 5/5  Sensation: Grossly intact Coordination with no dysmetria in the right upper, left lower and right lower extremity.  Did not perform with a left upper extremity  Medications  Current Facility-Administered Medications:    0.9 %  sodium chloride infusion, 100 mL, Intravenous, PRN, Breeze, Shantelle, NP   0.9 %  sodium chloride infusion, 100 mL, Intravenous, PRN, Gwyneth Revels, Shantelle, NP   acetaminophen (TYLENOL) tablet  650 mg, 650 mg, Oral, Q6H PRN, Ivor Costa, MD   alteplase (CATHFLO ACTIVASE) injection 2 mg, 2 mg, Intracatheter, Once PRN, Colon Flattery, NP   Chlorhexidine Gluconate Cloth 2 % PADS 6 each, 6 each, Topical, Q0600, Breeze, Shantelle, NP   gabapentin (NEURONTIN) capsule 100 mg, 100 mg, Oral, QHS, Ivor Costa, MD, 100 mg at 02/25/21 2233   heparin injection 1,000 Units, 1,000 Units, Dialysis, PRN, Breeze, Shantelle, NP   insulin aspart (novoLOG) injection 0-5 Units, 0-5 Units, Subcutaneous, QHS, Ivor Costa, MD   insulin aspart (novoLOG) injection 0-9 Units, 0-9 Units, Subcutaneous, TID WC, Niu, Xilin, MD   lidocaine (PF) (XYLOCAINE) 1 % injection 5 mL, 5 mL, Intradermal, PRN, Breeze, Shantelle, NP   lidocaine-prilocaine (EMLA) cream 1 application, 1 application, Topical, PRN, Breeze, Shantelle, NP   morphine 2 MG/ML injection 0.5 mg, 0.5 mg, Intravenous, Q3H PRN, Ivor Costa, MD, 0.5 mg at 02/25/21 6063   oxyCODONE (Oxy IR/ROXICODONE) immediate release tablet 5 mg, 5 mg, Oral, Q4H PRN, Ivor Costa, MD, 5 mg at 02/25/21 1844   oxyCODONE-acetaminophen (PERCOCET/ROXICET) 5-325 MG per tablet 1 tablet, 1 tablet, Oral, Q4H PRN, Ivor Costa, MD, 1 tablet at 02/25/21 2233   pentafluoroprop-tetrafluoroeth (GEBAUERS) aerosol 1 application, 1 application, Topical, PRN, Breeze, Shantelle, NP   senna-docusate (Senokot-S) tablet 1 tablet, 1 tablet, Oral, QHS PRN, Ivor Costa, MD Labs CBC    Component Value Date/Time   WBC 6.9 02/25/2021 0841   RBC 3.37 (L) 02/25/2021 0841   HGB 10.5 (L) 02/25/2021 0841   HGB 12.1 11/09/2013  1540   HCT 29.7 (L) 02/25/2021 0841   HCT 36.3 11/09/2013 1540   PLT 115 (L) 02/25/2021 0841   PLT 183 11/09/2013 1540   MCV 88.1 02/25/2021 0841   MCV 87 11/09/2013 1540   MCH 31.2 02/25/2021 0841   MCHC 35.4 02/25/2021 0841   RDW 13.5 02/25/2021 0841   RDW 14.0 11/09/2013 1540   LYMPHSABS 0.6 (L) 02/25/2021 0841   MONOABS 0.6 02/25/2021 0841   EOSABS 0.0 02/25/2021 0841    BASOSABS 0.0 02/25/2021 0841    CMP     Component Value Date/Time   NA 139 02/25/2021 0841   NA 142 11/10/2013 0522   K 3.6 02/25/2021 0841   K 4.6 11/10/2013 0522   CL 97 (L) 02/25/2021 0841   CL 110 (H) 11/10/2013 0522   CO2 32 02/25/2021 0841   CO2 27 11/10/2013 0522   GLUCOSE 225 (H) 02/25/2021 0841   GLUCOSE 70 11/10/2013 0522   BUN 27 (H) 02/25/2021 0841   BUN 22 (H) 11/10/2013 0522   CREATININE 3.40 (H) 02/25/2021 0841   CREATININE 1.12 11/10/2013 0522   CALCIUM 9.2 02/25/2021 0841   CALCIUM 8.4 (L) 11/10/2013 0522   PROT 7.2 06/09/2020 1145   PROT 9.0 (H) 11/09/2013 1540   ALBUMIN 3.9 06/09/2020 1145   ALBUMIN 3.8 11/09/2013 1540   AST 21 06/09/2020 1145   AST 29 11/09/2013 1540   ALT 20 06/09/2020 1145   ALT 27 11/09/2013 1540   ALKPHOS 48 06/09/2020 1145   ALKPHOS 59 11/09/2013 1540   BILITOT 0.6 06/09/2020 1145   BILITOT 0.2 11/09/2013 1540   GFRNONAA 13 (L) 02/25/2021 0841   GFRNONAA 47 (L) 11/10/2013 0522   GFRAA 27 (L) 08/22/2019 0449   GFRAA 54 (L) 11/10/2013 0522   Imaging I have reviewed images in epic and the results pertinent to this consultation are: Carotid Dopplers: Moderate bilateral carotid atherosclerosis.  Negative for significant stenosis.  Degree of narrowing less than 50% bilaterally by ultrasound criteria.  Patent antegrade vertebral artery flow bilaterally. 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  Atheromatous change within the left carotid siphon with associated mild to moderate stenosis.  4 mm cavernous right ICA aneurysm.  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  Assessment:  87 year old with extensive past medical history including multiple strokes most recent right MCA stroke in March 2022 with residual left-sided weakness, with worsening left-sided weakness after left upper extremity  AV fistula surgery 2 days ago. MRI-personally reviewed-although formal radiology read is concerning for acute on chronic infarction it looks like cortical laminar necrosis and not like a new stroke.  She does have left hand grip weakness and left wrist drop which is new from before, but I do not suspect that this is from a new stroke.  Recommendations: I would resume her Eliquis-starting tonight if okay with vascular surgery She should follow-up with outpatient neurology in 4 to 6 weeks I do not see a need for full further stroke work-up PT OT assessments underway No need for permissive hypertension Please call with questions as needed Plan relayed to Dr. Avon Gully  -- Amie [place], MD Neurologist [facility] Pager: [phone]

## 2021-02-26 NOTE — NC FL2 (Signed)
[place] LEVEL OF CARE SCREENING TOOL     IDENTIFICATION  Patient Name: Brianna Dodson Birthdate: [birth year] Sex: female Admission Date (Current Location): 02/25/2021  [place] and [place] Number:  Engineering geologist and Address:  [facility], [address] [place], [place] [address]      Provider Number: [id number]  Attending Physician Name and Address:  Little Ishikawa, MD  Relative Name and Phone Number:  Augustin Schooling (sister) [phone]    Current Level of Care: Hospital Recommended Level of Care: [place] Prior Approval Number:    Date Approved/Denied:   PASRR Number: pending  Discharge Plan: SNF    Current Diagnoses: Patient Active Problem List   Diagnosis Date Noted   Renal dialysis device, implant, or graft complication    Stroke ([place]) 02/25/2021   Left arm pain 02/25/2021   Thrombocytopenia ([place]) 02/25/2021   Other disorders of phosphorus metabolism 01/12/2021   Cerebral infarction, unspecified ([place]) 10/12/2020   Coagulation defect, unspecified ([place]) 10/12/2020   Diarrhea, unspecified 10/12/2020   Encounter for immunization 10/12/2020   End stage renal disease on dialysis ([place]) 10/12/2020   Fever, unspecified 10/12/2020   Headache, unspecified 10/12/2020   Iron deficiency anemia, unspecified 10/12/2020   Localized edema 10/12/2020   Nontoxic single thyroid nodule 10/12/2020   Occlusion and stenosis of unspecified carotid artery 10/12/2020   Other fluid overload 10/12/2020   Pain, unspecified 10/12/2020   Shortness of breath 10/12/2020   Vascular dementia without behavioral disturbance ([place]) 10/12/2020   Malnutrition of moderate degree 09/13/2020   TIA (transient ischemic attack) 09/10/2020   Anemia of chronic disease 09/10/2020   CVA (cerebral vascular accident) ([place]) 09/10/2020   Sleeping difficulty 07/08/2020   Fatigue 06/09/2020   Mixed Alzheimer's and vascular dementia ([place])  06/09/2020   Coccydynia 01/26/2020   Leukopenia 12/08/2019   Muscle strain 12/08/2019   Pedal edema 11/26/2019   Dysarthria and anarthria 11/07/2019   Atrial fibrillation ([place]) 10/21/2019   Nocturia 10/21/2019   Seborrheic keratosis 10/21/2019   Carotid stenosis 08/27/2019   Thyroid nodule 08/27/2019   Status post placement of implantable loop recorder 08/07/2019   Anemia in ESRD (end-stage renal disease) ([place]) 04/18/2019   Aphasia 04/12/2019   Benign hypertensive renal disease 04/07/2019   Hyperparathyroidism due to renal insufficiency ([place]) 04/07/2019   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, or unspecified 04/07/2019   Proteinuria 04/07/2019   History of stroke with current residual effects 03/15/2019   Hyperlipidemia associated with type 2 diabetes mellitus ([place]) 07/25/2018   Itching 10/23/2017   Nevus 04/17/2017   CKD (chronic kidney disease), stage V ([place]) 02/02/2015   Vitamin D deficiency 02/02/2015   Obesity (BMI 30-39.9) 08/03/2014   Type II diabetes mellitus with renal manifestations ([place]) 01/29/2014   Essential hypertension 01/13/2014   HLD (hyperlipidemia) 01/13/2014    Orientation RESPIRATION BLADDER Height & Weight     Self  Normal Continent Weight: 68.4 kg Height:  5\' 5"  (165.1 cm)  BEHAVIORAL SYMPTOMS/MOOD NEUROLOGICAL BOWEL NUTRITION STATUS      Continent Diet (see discharge summary)  AMBULATORY STATUS COMMUNICATION OF NEEDS Skin   Limited Assist Verbally Other (Comment), Surgical wounds (left upper arm surgical incision AV Graft)                       Personal Care Assistance Level of Assistance  Bathing, Feeding, Dressing Bathing Assistance: Limited assistance Feeding assistance: Limited assistance Dressing  Assistance: Limited assistance     Functional Limitations Info  Sight, Hearing, Speech Sight Info: Impaired Hearing Info: Adequate Speech Info: Adequate    SPECIAL CARE FACTORS  FREQUENCY  PT (By licensed PT), OT (By licensed OT)     PT Frequency: 5 times per week OT Frequency: 5 times per week            Contractures Contractures Info: Not present    Additional Factors Info  Code Status, Allergies Code Status Info: Full Allergies Info: Nsaids           Current Medications (02/26/2021):  This is the current hospital active medication list Current Facility-Administered Medications  Medication Dose Route Frequency Provider Last Rate Last Admin   0.9 %  sodium chloride infusion  100 mL Intravenous PRN Breeze, Shantelle, NP       0.9 %  sodium chloride infusion  100 mL Intravenous PRN Colon Flattery, NP       acetaminophen (TYLENOL) tablet 650 mg  650 mg Oral Q6H PRN Ivor Costa, MD       alteplase (CATHFLO ACTIVASE) injection 2 mg  2 mg Intracatheter Once PRN Colon Flattery, NP       Chlorhexidine Gluconate Cloth 2 % PADS 6 each  6 each Topical Q0600 Colon Flattery, NP       gabapentin (NEURONTIN) capsule 100 mg  100 mg Oral QHS Ivor Costa, MD   100 mg at 02/25/21 2233   heparin injection 1,000 Units  1,000 Units Dialysis PRN Colon Flattery, NP       insulin aspart (novoLOG) injection 0-5 Units  0-5 Units Subcutaneous QHS Ivor Costa, MD       insulin aspart (novoLOG) injection 0-9 Units  0-9 Units Subcutaneous TID WC Ivor Costa, MD       lidocaine (PF) (XYLOCAINE) 1 % injection 5 mL  5 mL Intradermal PRN Colon Flattery, NP       lidocaine-prilocaine (EMLA) cream 1 application  1 application Topical PRN Colon Flattery, NP       morphine 2 MG/ML injection 0.5 mg  0.5 mg Intravenous Q3H PRN Ivor Costa, MD   0.5 mg at 02/25/21 9509   oxyCODONE (Oxy IR/ROXICODONE) immediate release tablet 5 mg  5 mg Oral Q4H PRN Ivor Costa, MD   5 mg at 02/25/21 1844   oxyCODONE-acetaminophen (PERCOCET/ROXICET) 5-325 MG per tablet 1 tablet  1 tablet Oral Q4H PRN Ivor Costa, MD   1 tablet at 02/25/21 2233   pentafluoroprop-tetrafluoroeth (GEBAUERS) aerosol 1  application  1 application Topical PRN Colon Flattery, NP       senna-docusate (Senokot-S) tablet 1 tablet  1 tablet Oral QHS PRN Ivor Costa, MD         Discharge Medications: Please see discharge summary for a list of discharge medications.  Relevant Imaging Results:  Relevant Lab Results:   Additional Information SS# [id number]  Shelbie Hutching, RN

## 2021-02-26 NOTE — Evaluation (Signed)
Occupational Therapy Evaluation Patient Details Name: Brianna Dodson MRN: [id number] DOB: 1933-08-21 Today's Date: 02/26/2021    History of Present Illness Pt is an 87 y/o F with PMH: multiple strokes most recent right MCA stroke in March 2022 with residual left-sided weakness, with worsening left-sided weakness after left upper extremity AV fistula surgery on 8/25 (Dr. [name]). Pt presented to ED d/t L arm pain/throbbing starting the night after the procedure. MRI reviewed by neurology and assessment as follows: "although formal radiology read is concerning for acute on chronic infarction it looks like cortical laminar necrosis and not like a new stroke.  She does have left hand grip weakness and left wrist drop which is new from before, but I do not suspect that this is from a new stroke."   Clinical Impression   Pt seen for OT evaluation this date in setting of acute hospitalization d/t L UE pain. Pt somewhat unreliable historian d/t cognitive difficulties at baseline. Pt reports intermittent use of cane at baseline and granddtr did confirm. Granddtr reports that she lives with pt and pt has BR/BA on second floor of home. Granddtr, via telephone, reports that pt has been requiring increasing assist with bathing, dressing and ascending stairs over last few months but reports that pt can typically get up stairs and perform basic self care on her own. Pt presents this date with pain and very limited AROM of L UE superimposed on baseline weakness and deconditioning. Pt requires MIN A for seated UB ADLs and MOD A for seated LB ADLs. Pt requires hand held assist and CGA for ADL transfers and short bouts of functional mobility. At this time, d/t significant functional decline with ADLs, anticipate pt would benefit from f/u OT services in STR setting.     Follow Up Recommendations  SNF    Equipment Recommendations  3 in 1 bedside commode;Tub/shower seat    Recommendations for Other Services        Precautions / Restrictions Precautions Precautions: Fall Restrictions Weight Bearing Restrictions: No      Mobility Bed Mobility Overal bed mobility: Needs Assistance Bed Mobility: Supine to Sit     Supine to sit: Supervision;HOB elevated     General bed mobility comments: increased time, use of bed rails    Transfers Overall transfer level: Needs assistance Equipment used: 1 person hand held assist Transfers: Sit to/from Stand Sit to Stand: Min guard         General transfer comment: hand held assist and CGA for balance on R side.    Balance Overall balance assessment: Needs assistance Sitting-balance support: Feet supported Sitting balance-Leahy Scale: Fair Sitting balance - Comments: able to maintain static sitting w/o support, but cannot tolerate a challenge     Standing balance-Leahy Scale: Fair Standing balance comment: requires CGA for safety, would benefit from use of cane                           ADL either performed or assessed with clinical judgement   ADL Overall ADL's : Needs assistance/impaired                                       General ADL Comments: Pt requires MIN A for seated UB ADLs, MOD A for seated LB ADLs that require [facility] such as donning socks. Pt requires CGA for ADL transfers with hand held  assist on R side.     Vision   Additional Comments: difficult to formally assess d/t cognition     Perception     Praxis      Pertinent Vitals/Pain Pain Assessment: Faces Faces Pain Scale: Hurts even more Pain Location: L UE with any attempt to reposition including attempting to re-position sling Pain Descriptors / Indicators: Grimacing;Guarding Pain Intervention(s): Limited activity within patient's tolerance;Monitored during session     Hand Dominance Right   Extremity/Trunk Assessment Upper Extremity Assessment Upper Extremity Assessment: RUE deficits/detail;LUE deficits/detail RUE Deficits / Details: R  UE ROM WFL. MMT Grossly 4-/5 LUE Deficits / Details: some ability to shrug shoulder, otherwise essentially no active ROM of L UE. Pt still with sensation in tact including pain. LUE: Unable to fully assess due to pain LUE Coordination: decreased fine motor;decreased gross motor   Lower Extremity Assessment Lower Extremity Assessment: Overall WFL for tasks assessed;Generalized weakness (ROM WFL but MMT grossly 4-/5)       Communication Communication Communication: Expressive difficulties (short/curt phrases, somewhat garbled, but mostly discernable)   Cognition Arousal/Alertness: Awake/alert Behavior During Therapy: WFL for tasks assessed/performed Overall Cognitive Status: History of cognitive impairments - at baseline                                 General Comments: Pt with some accurate PLOF/home information, but some incorrect including reporting no steps to get in the home. Pt able to follow basic commands. Oriented to self, place, and some aspects of situation. Not oriented to time.   General Comments       Exercises     Shoulder Instructions      Home Living Family/patient expects to be discharged to:: Private residence Living Arrangements: Other relatives;Spouse/significant other (grand daughter lives with the pt) Available Help at Discharge: Family;Available 24 hours/day Type of Home: House Home Access: Stairs to enter [employer] of Steps: 2 Entrance Stairs-Rails: Can reach both Home Layout: Two level;Bed/bath upstairs Alternate Level Stairs-Number of Steps: 15   Bathroom Shower/Tub: Teacher, early years/pre: Standard     Home Equipment: Environmental consultant - 2 wheels;Cane - single point          Prior Functioning/Environment Level of Independence: Needs assistance  Gait / Transfers Assistance Needed: granddtr reports that pt uses cane intermittently, has walker but wont use it. Granddtr reports that pt has been requiring increasing  assistance to ascent stairs to second floor. ADL's / Homemaking Assistance Needed: Pt with assist for bathing/dressing from granddtr who reports it has gradually gotten more and more difficult since last hosptialization.   Comments: Pt reports. Unreliable historian due to hx of dementia. Pt's granddtr contacted via home phone number listed in chart.        OT Problem List: Decreased strength;Decreased range of motion;Decreased activity tolerance;Impaired balance (sitting and/or standing);Decreased coordination;Decreased knowledge of precautions;Decreased safety awareness;Impaired UE functional use      OT Treatment/Interventions: Self-care/ADL training;Therapeutic exercise;Neuromuscular education;DME and/or AE instruction;Therapeutic activities    OT Goals(Current goals can be found in the care plan section) Acute Rehab OT Goals Patient Stated Goal: to go home OT Goal Formulation: With patient/family Time For Goal Achievement: 03/11/21 Potential to Achieve Goals: Fair ADL Goals Pt Will Perform Upper Body Dressing: with min guard assist;sitting (with modified technique) Pt Will Perform Lower Body Dressing: with min guard assist;with min assist;sit to/from stand (with modified one-hand technique as needed.) Pt Will Perform  Toileting - Clothing Manipulation and hygiene: with min guard assist;with supervision;sit to/from stand Pt/caregiver will Perform Home Exercise Program: Increased ROM;Left upper extremity (with R UE self assisting with MIN verbal cues.)  OT Frequency: Min 1X/week   Barriers to D/C:            Co-evaluation              AM-PAC OT "6 Clicks" Daily Activity     Outcome Measure Help from another person eating meals?: None Help from another person taking care of personal grooming?: A Little Help from another person toileting, which includes using toliet, bedpan, or urinal?: A Little Help from another person bathing (including washing, rinsing, drying)?: A  Lot Help from another person to put on and taking off regular upper body clothing?: A Little Help from another person to put on and taking off regular lower body clothing?: A Lot 6 Click Score: 17   End of Session Equipment Utilized During Treatment: Gait belt  Activity Tolerance: Patient tolerated treatment well Patient left: in chair;with call bell/phone within reach  OT Visit Diagnosis: Unsteadiness on feet (R26.81);History of falling (Z91.81)                Time: 9163-8466 OT Time Calculation (min): 28 min Charges:  OT General Charges $OT Visit: 1 Visit OT Evaluation $OT Eval Moderate Complexity: 1 Mod OT Treatments $Self Care/Home Management : 8-22 mins  Gerrianne Scale, MS, OTR/L ascom [phone] 02/26/21, 11:16 AM

## 2021-02-26 NOTE — Progress Notes (Signed)
Subjective: Interval History: has no complaint of left hand pain or coldness.   Objective: Vital signs in last 24 hours: Temp:  [97.4 F (36.3 C)-98.4 F (36.9 C)] 97.4 F (36.3 C) (08/27 0749) Pulse Rate:  [69-85] 72 (08/27 0749) Resp:  [15-19] 18 (08/27 0749) BP: (109-177)/(58-109) 177/67 (08/27 0749) SpO2:  [97 %-100 %] 99 % (08/27 0749) Weight:  [68.4 kg] 68.4 kg (08/26 1756)  Intake/Output from previous day: 08/26 0701 - 08/27 0700 In: 120 [P.O.:120] Out: 0  Intake/Output this shift: No intake/output data recorded.  General appearance: alert, cooperative, and asks appropriate questions regarding who I am in relation to Dr. [name] Resp: Normal respiratory effort Extremities: Left upper extremity in sling.  She has a very asthenic body habitus without edema of the hand.  Her hand is warm and somewhat sweaty on examination this morning.  She is nontender.  She has no significant grip with limited range of motion of the wrist and hand (question CVA) Pulses: 2+ and symmetric (Ignore line above, unable to delete it) unable to palpate left radial pulse, palpable pulse within the graft. By Doppler interrogation, she has a strong multiphasic radial signal with graft compression, but this diminishes completely with graft patency. Neurologic: Motor: Very limited voluntary motion of left upper extremity especially forearm wrist and hand, intrinsic muscle atrophy of the hand    Lab Results: Recent Labs    02/24/21 1141 02/24/21 1153 02/25/21 0841  WBC 2.8*  --  6.9  HGB 10.0* 9.2* 10.5*  HCT 28.2* 27.0* 29.7*  PLT 111*  --  115*   BMET Recent Labs    02/24/21 1153 02/25/21 0841  NA 141 139  K 3.6 3.6  CL 97* 97*  CO2  --  32  GLUCOSE 156* 225*  BUN 20 27*  CREATININE 3.00* 3.40*  CALCIUM  --  9.2    Studies/Results: CT HEAD WO CONTRAST (5MM)  Result Date: 02/25/2021 CLINICAL DATA:  87 year old female with dizziness. Acute on chronic right MCA infarct in March. Dialysis  patient with pain in the region of left arm AV fistula. EXAM: CT HEAD WITHOUT CONTRAST TECHNIQUE: Contiguous axial images were obtained from the base of the skull through the vertex without intravenous contrast. COMPARISON:  Brain MRI 09/12/2020.  Head CT 09/10/2020. FINDINGS: Brain: Right MCA territory encephalomalacia. Mild ex vacuo enlargement of the ventricles. Patchy and confluent additional bilateral cerebral white matter hypodensity. No midline shift, ventriculomegaly, mass effect, evidence of mass lesion, intracranial hemorrhage or evidence of cortically based acute infarction. Vascular: Extensive Calcified atherosclerosis at the skull base. Right MCA calcified atherosclerosis. No suspicious intracranial vascular hyperdensity. Skull: No acute osseous abnormality identified. Sinuses/Orbits: Continued bubbly opacity in the right sphenoid sinus. Other Visualized paranasal sinuses and mastoids are clear. Other: No acute orbit or scalp soft tissue finding. IMPRESSION: 1. Chronic right MCA territory infarct and advanced chronic small vessel disease. 2. No acute intracranial abnormality. Electronically Signed   By: Genevie Ann M.D.   On: 02/25/2021 10:16   CT ANGIO UP EXTREM LEFT W &/OR WO CONTAST  Result Date: 02/25/2021 CLINICAL DATA:  87 year old female with a murmur at the site of AV fistula EXAM: CT ANGIOGRAPHY UPPER LEFT EXTREMITY TECHNIQUE: Axial spiral CT images were acquired of the left upper extremity after administration of a standard IV contrast bolus. The images were acquired with timing specific for the arterial system. Axial and coronal images were performed on a separate workstation CONTRAST:  62mL OMNIPAQUE IOHEXOL 350 MG/ML SOLN COMPARISON:  None. FINDINGS: VASCULAR: Axillary artery is patent with mild atherosclerosis and no high-grade stenosis or occlusion. The humeral circumflex branches remain patent. Brachial artery is patent without significant atherosclerosis throughout the upper arm.  Surgical changes of arteriovenous fistula/dialysis circuit of the upper arm. The anastomosis is patent, with patent venous outflow through the brachial vein, axillary vein, and distal subclavian vein. Distal to the anastomosis the brachial artery remains patent to the antecubital region. Radial artery and the ulnar artery are patent to the distal forearm. There is decreased attenuation distally, which may be secondary to late arrival of the contrast bolus. The radial artery is dominant compared to the ulnar artery. A small interosseous artery is present proximally, without opacification distally. Incidentally imaged left common femoral artery appears patent as well as the proximal profunda femoris and SFA. The pelvis is not imaged. Nonvascular: Gas within the soft tissues of the left upper arm adjacent to the fistula. No pooling of contrast or extravasation of contrast. Edema within the fat planes adjacent to the musculature of the upper arm without fluid collection. Degenerative changes of the glenohumeral joint, acromioclavicular joint. No acute displaced fracture. Review of the MIP images confirms the above findings. IMPRESSION: CT angiogram demonstrates a patent left upper extremity arteriovenous dialysis circuit. If there is concern for hemodynamic assessment, directed duplex is a more sensitive and specific test, and should be considered if there is a suspicion of flow abnormality. Gas and edema within the soft tissues of the left upper arm adjacent to the dialysis circuit. While this is presumably related to usage/cannulation, soft tissue infection/cellulitis can not be excluded on CT. Signed, Dulcy Fanny. Dellia Nims, RPVI [facility] Radiology Electronically Signed   By: Corrie Mckusick D.O.   On: 02/25/2021 10:17   MR ANGIO HEAD WO CONTRAST  Result Date: 02/25/2021 CLINICAL DATA:  Follow-up examination for acute stroke. EXAM: MRA HEAD WITHOUT CONTRAST  TECHNIQUE: Angiographic images of the Circle of Willis were acquired using MRA technique without intravenous contrast. COMPARISON:  Prior MRI from earlier the same day. FINDINGS: Anterior circulation: Examination moderately degraded by motion artifact. Visualized distal cervical segments of the internal carotid arteries are patent with antegrade flow. Partially visualized cervical left ICA tortuous. Petrous segments patent bilaterally. Cavernous and supraclinoid right ICA widely patent without stenosis. 4 mm saccular outpouching arising from the cavernous right ICA consistent with a small paraophthalmic aneurysm (series 9, image 119). Atheromatous change within the left carotid siphon with associated mild-to-moderate stenosis (series 9, image 119). Left A1 widely patent. Right A1 hypoplastic and/or absent. Normal anterior communicating artery complex. Anterior cerebral arteries grossly patent to their distal aspects without stenosis. Left M1 patent. Grossly normal left MCA bifurcation. Distal left MCA branches perfused. Right M1 segment. There remains a patent small anterior right temporal branch. 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 Posterior circulation: Dominant left vertebral artery patent to the vertebrobasilar junction without stenosis. Left PICA patent. Right vertebral artery largely terminates at the level of the right PICA, although a tiny branch ascending towards the vertebrobasilar junction. Right PICA patent as well. Basilar mildly diminutive but patent to its distal aspect without stenosis. Superior cerebral arteries patent bilaterally. Fetal type origin of the PCAs bilaterally. PCAs remain well perfused to their distal aspects without obvious proximal high-grade stenosis. Anatomic variants: Fetal type origin of the PCAs. Dominant left vertebral artery. Hypoplastic/absent right A1 segment, with the anterior cerebral artery supplied via  the  left carotid artery system. Other: None. IMPRESSION: 1. 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 2. Atheromatous change within the left carotid siphon with associated mild-to-moderate stenosis. 3. 4 mm cavernous right ICA aneurysm. 4. Fetal type origin of the PCAs bilaterally. Hypoplastic/absent right A1, with the anterior cerebral arteries supplied primarily via the left carotid artery system. Electronically Signed   By: Jeannine Boga M.D.   On: 02/25/2021 23:44   MR BRAIN WO CONTRAST  Result Date: 02/25/2021 CLINICAL DATA:  Stroke suspected EXAM: MRI HEAD WITHOUT CONTRAST TECHNIQUE: Multiplanar, multiecho pulse sequences of the brain and surrounding structures were obtained without intravenous contrast. COMPARISON:  09/12/2020 MRI, correlation is also made with CT head 02/25/2021. FINDINGS: Evaluation somewhat limited by motion artifact. Brain: Small area of cortical restricted diffusion in the right parietal lobe (series 5, images 33-40), within area of encephalomalacia from prior right MCA territory infarct. No acute hemorrhage, mass, mass effect, or midline shift. No extra-axial fluid collection. Redemonstrated chronic microhemorrhages in the bilateral deep gray nuclei. Confluent T2 hyperintense signal in the periventricular white matter and pons, likely the sequela of here chronic small vessel ischemic disease. Unchanged ex vacuo dilatation of the right lateral ventricle. Vascular: 08/21/2019 Skull and upper cervical spine: Normal marrow signal. Sinuses/Orbits: Status post bilateral lens replacements. Otherwise negative. Other: Trace fluid in right mastoid air cells. IMPRESSION: Evaluation is somewhat limited by motion artifact. Within this limitation, there is acute ischemia within an area of remote infarct in the right parietal lobe. No evidence of hemorrhagic transformation. These results were called by telephone at the time of interpretation on 02/25/2021 at  8:16 am to provider [facility] , who verbally acknowledged these results. Electronically Signed   By: Merilyn Baba M.D.   On: 02/25/2021 11:18   US Carotid Bilateral  Result Date: 02/26/2021 CLINICAL DATA:  Stroke symptoms, syncope hyperlipidemia, diabetes EXAM: BILATERAL CAROTID DUPLEX ULTRASOUND TECHNIQUE: Pearline Cables scale imaging, color Doppler and duplex ultrasound were performed of bilateral carotid and vertebral arteries in the neck. COMPARISON:  None. FINDINGS: Criteria: Quantification of carotid stenosis is based on velocity parameters that correlate the residual internal carotid diameter with NASCET-based stenosis levels, using the diameter of the distal internal carotid lumen as the denominator for stenosis measurement. The following velocity measurements were obtained: RIGHT ICA: 76/12 cm/sec CCA: 41/3 cm/sec SYSTOLIC ICA/CCA RATIO:  1.4 ECA: 51 cm/sec LEFT ICA: 86/19 cm/sec CCA: 24/4 cm/sec SYSTOLIC ICA/CCA RATIO:  1.3 ECA: 161 cm/sec RIGHT CAROTID ARTERY: Scattered mild-to-moderate echogenic shadowing plaque formation. No hemodynamically significant right ICA stenosis, velocity elevation, or turbulent flow. Degree of narrowing less than 50%. RIGHT VERTEBRAL ARTERY:  Normal antegrade flow LEFT CAROTID ARTERY: Similar scattered moderate echogenic plaque formation. No hemodynamically significant left ICA stenosis, velocity elevation, or turbulent flow. LEFT VERTEBRAL ARTERY:  Normal antegrade flow IMPRESSION: Moderate bilateral carotid atherosclerosis. Negative for significant stenosis. Degree of narrowing less than 50% bilaterally by ultrasound criteria. Patent antegrade vertebral flow bilaterally Electronically Signed   By: Jerilynn Mages.  Shick M.D.   On: 02/26/2021 07:26   [place] OR NERVE BLOCK-IMAGE ONLY [facility])  Result Date: 02/24/2021 There is no interpretation for this exam.  This order is for images obtained during a surgical procedure.  Please See "Surgeries" Tab for more information regarding the procedure.    Anti-infectives: Anti-infectives (From admission, onward)    None       Assessment/Plan: s/p * No surgery found * Status post left upper extremity brachial axillary  graft 8/25 (postop day 2).  Admitted with left upper extremity symptoms including pain and significant weakness.  Although CTA demonstrated patency of the forearm vessels proximally, no opacification was noted distally - all consistent with steal.  Confounding issues include prior right hemispheric stroke with findings of some acute on chronic changes on brain MRI.   Given the constellation of findings, I think the best plan is to ligate her AV graft in order to restore normal hand perfusion.  Unfortunately, in some cases with rather profound neurologic deficit , revascularization does not result in neurologic recovery, but this cannot be predicted.    I have discussed all of this with the patient including risks, benefits and alternatives.  She asks appropriate questions and wishes to proceed.  I will discuss with her sister as well at the patient's request.  Ate a regular breakfast at 0900.   LOS: 0 days   Bertram Savin 02/26/2021, 10:25 AM

## 2021-02-26 NOTE — Evaluation (Signed)
Physical Therapy Evaluation Patient Details Name: Brianna Dodson MRN: [id number] DOB: September 02, 1933 Today's Date: 02/26/2021   History of Present Illness  Pt is an 87 y/o F with PMH: multiple strokes most recent right MCA stroke in March 2022 with residual left-sided weakness, with worsening left-sided weakness after left upper extremity AV fistula surgery on 8/25 (Dr. [name]). Pt presented to ED d/t L arm pain/throbbing starting the night after the procedure. MRI reviewed by neurology and assessment as follows: "although formal radiology read is concerning for acute on chronic infarction it looks like cortical laminar necrosis and not like a new stroke.  She does have left hand grip weakness and left wrist drop which is new from before, but I do not suspect that this is from a new stroke."  Clinical Impression  Pt is a pleasant 87 year old female who was admitted for CVA in R parietal lobe. Main deficit appears to be sensation/functional use of L UE. Questionable vascular involvement from HD cath? Pt performs bed mobility, transfers, and ambulation with CGA and use of QC. Per discussion with pt, she typically uses SPC at baseline. Plan to trial [facility] next session along with increased distance. Pt demonstrates deficits with L UE deficits including strength/sensation. Appears to be close to baseline regarding mobility trial and has great support at home. Recommend possible ALF if family unable to continue supervision 24/7. Would benefit from skilled PT to address above deficits and promote optimal return to PLOF. Recommend transition to [place] upon discharge from acute hospitalization.     Follow Up Recommendations Home health PT;Supervision/Assistance - 24 hour    Equipment Recommendations  None recommended by PT    Recommendations for Other Services       Precautions / Restrictions Precautions Precautions: Fall Restrictions Weight Bearing Restrictions: No      Mobility  Bed Mobility Overal bed  mobility: Needs Assistance Bed Mobility: Sit to Supine     Supine to sit: Supervision;HOB elevated Sit to supine: Min guard   General bed mobility comments: received in recliner and ended in bed. Requires cues for adjusting and positioning in bed. Follows commands well    Transfers Overall transfer level: Needs assistance Equipment used: Quad cane Transfers: Sit to/from Stand Sit to Stand: Min guard         General transfer comment: cues for pushing from seated surface. Several reps performed for strengthening. Once standing, able to use QC with supervision  Ambulation/Gait Ambulation/Gait assistance: Min guard Gait Distance (Feet): 60 Feet Assistive device: Quad cane Gait Pattern/deviations: Step-through pattern     General Gait Details: ambulated in/around room. Declined to go out in hallway. Safe technique with QC, however pt reports she would rather use SPC. WIll trial that AD next session  Stairs            Wheelchair Mobility    Modified Rankin (Stroke Patients Only)       Balance Overall balance assessment: Needs assistance Sitting-balance support: Feet supported Sitting balance-Leahy Scale: Fair Sitting balance - Comments: able to maintain static sitting w/o support, but cannot tolerate a challenge     Standing balance-Leahy Scale: Fair Standing balance comment: requires CGA                             Pertinent Vitals/Pain Pain Assessment: No/denies pain Faces Pain Scale: Hurts even more Pain Location: L UE with any attempt to reposition including attempting to re-position sling Pain Descriptors /  Indicators: Grimacing;Guarding Pain Intervention(s): Limited activity within patient's tolerance;Monitored during session    [place] expects to be discharged to:: Private residence Living Arrangements: Other relatives;Spouse/significant other (lives with grandaughter) Available Help at Discharge: Family;Available 24  hours/day Type of Home: House Home Access: Stairs to enter Entrance Stairs-Rails: Can reach both Entrance Stairs-Number of Steps: 2 Home Layout: Two level;Bed/bath upstairs Home Equipment: Walker - 2 wheels;Cane - single point      Prior Function Level of Independence: Needs assistance   Gait / Transfers Assistance Needed: granddtr reports that pt uses cane intermittently, has walker but wont use it. Granddtr reports that pt has been requiring increasing assistance to ascent stairs to second floor.  ADL's / Homemaking Assistance Needed: Pt with assist for bathing/dressing from granddtr who reports it has gradually gotten more and more difficult since last hosptialization.  Comments: Pt reports. Unreliable historian due to hx of dementia. Pt's granddtr contacted via home phone number listed in chart.     Hand Dominance   Dominant Hand: Right    Extremity/Trunk Assessment   Upper Extremity Assessment Upper Extremity Assessment: Defer to OT evaluation RUE Deficits / Details: R UE ROM WFL. MMT Grossly 4-/5 LUE Deficits / Details: some ability to shrug shoulder, otherwise essentially no active ROM of L UE. Pt still with sensation in tact including pain. LUE: Unable to fully assess due to pain LUE Coordination: decreased fine motor;decreased gross motor    Lower Extremity Assessment Lower Extremity Assessment: Generalized weakness (B LE grossly 4/5-no focal deficit noted)       Communication   Communication: No difficulties  Cognition Arousal/Alertness: Awake/alert Behavior During Therapy: WFL for tasks assessed/performed Overall Cognitive Status: History of cognitive impairments - at baseline                                 General Comments: appears baseline with cognition deficits. Can follow commands well, however demonstrates confusion to situation      General Comments      Exercises     Assessment/Plan    PT Assessment Patient needs continued PT  services  PT Problem List Decreased strength;Decreased balance;Decreased mobility;Decreased cognition;Decreased knowledge of precautions       PT Treatment Interventions DME instruction;Gait training;Therapeutic exercise;Balance training;Stair training    PT Goals (Current goals can be found in the Care Plan section)  Acute Rehab PT Goals Patient Stated Goal: to go home PT Goal Formulation: With patient Time For Goal Achievement: 03/12/21 Potential to Achieve Goals: Good    Frequency 7X/week   Barriers to discharge        Co-evaluation               AM-PAC PT "6 Clicks" Mobility  Outcome Measure Help needed turning from your back to your side while in a flat bed without using bedrails?: A Little Help needed moving from lying on your back to sitting on the side of a flat bed without using bedrails?: A Little Help needed moving to and from a bed to a chair (including a wheelchair)?: A Little Help needed standing up from a chair using your arms (e.g., wheelchair or bedside chair)?: A Little Help needed to walk in hospital room?: A Little Help needed climbing 3-5 steps with a railing? : A Little 6 Click Score: 18    End of Session Equipment Utilized During Treatment: Gait belt Activity Tolerance: Patient tolerated treatment well Patient left:  in bed;with bed alarm set (With MD in room) Nurse Communication: Mobility status PT Visit Diagnosis: Unsteadiness on feet (R26.81);Muscle weakness (generalized) (M62.81);History of falling (Z91.81);Difficulty in walking, not elsewhere classified (R26.2)    Time: 0942-1000 PT Time Calculation (min) (ACUTE ONLY): 18 min   Charges:   PT Evaluation $PT Eval Low Complexity: 1 Low PT Treatments $Gait Training: 8-22 mins        Greggory Stallion, PT, DPT [phone]   Rufino Staup 02/26/2021, 11:45 AM

## 2021-02-26 NOTE — Progress Notes (Signed)
To whom it may concern:  Please be advised that the above named patient has a primary diagnosis of mixed dementia Alzheimers and vascular, which supercedes and psychiatric diagnosis.

## 2021-02-26 NOTE — Anesthesia Postprocedure Evaluation (Signed)
Anesthesia Post Note  Patient: Forensic scientist  Procedure(s) Performed: LIGATION OF ARTERIOVENOUS  FISTULA (Left)  Patient location during evaluation: PACU Anesthesia Type: MAC Level of consciousness: awake and alert Pain management: pain level controlled Vital Signs Assessment: post-procedure vital signs reviewed and stable Respiratory status: spontaneous breathing, nonlabored ventilation, respiratory function stable and patient connected to nasal cannula oxygen Cardiovascular status: stable and blood pressure returned to baseline Postop Assessment: no apparent nausea or vomiting Anesthetic complications: no   No notable events documented.   Last Vitals:  Vitals:   02/26/21 1843 02/26/21 1933  BP: (!) 190/77 (!) 157/72  Pulse: 75 75  Resp: 16 16  Temp: 36.7 C 36.9 C  SpO2: 97% 100%    Last Pain:  Vitals:   02/26/21 2135  TempSrc:   PainSc: 0-No pain                 Arita Miss

## 2021-02-26 NOTE — Op Note (Signed)
Preoperative diagnosis: Ischemic steal syndrome left upper extremity  Postoperative diagnosis: Same  Procedure: Ligation of left upper extremity brachial axillary graft  Surgeon: Lowella Grip  Assistant: None  Anesthesia: Local infiltration with intravenous sedation and monitored anesthesia care  Complications: None  Drains: None  EBL: Minimal  Indications: Patient is an elderly African-American age renal disease who is currently dialyzing with a tunneled catheter.  She had a previous brachiocephalic fistula that failed to mature and subsequently underwent placement of a brachial-axillary arteriovenous graft on 8/25.  She presented to the emergency department yesterday complaining of pain and weakness of her arm and was noted to have a markedly abnormal neurologic examination of that upper extremity.  She underwent work-up including a CTA of her arm, carotid duplex as well as an MRI of her brain.  While she did have some evidence of acute on chronic stroke affecting the right hemisphere, her examination was consistent with significant ischemic steal with a nonpalpable and nonaudible radial artery pulse when the graft was patent.  Graft compression resulted in return of perfusion to the hand.  Therefore she is returned to the operating room for ligation of this graft for ischemic steal with significant neurologic deficit.  Findings: The brachial arterial anastomosis was hemostatic.  With ligation of the graft, a palpable radial pulse was restored.  Excellent Doppler signals are noted in the radial artery but the ulnar remains severely diminished consistent with chronic occlusive disease in that territory.  Procedure details: After obtaining informed consent the patient was brought to the operating room placed supine on the operating room table.  Sedation was administered by anesthesia and the left arm was outstretched on the armboard, prepped and draped into a sterile field.  After timeout, 1%  lidocaine mixed one-to-one with half percent bupivacaine was used to infiltrate the previous incision just above the antecubital crease.  The previous Dermabond closure was removed in piecemeal fashion and the skin closure suture was identified and the knot was divided.  15 blade was then used to reopen the incision and a small amount of serosanguineous drainage was suctioned.  Hemostatic agents were noted around the anastomosis and these were irrigated and removed.  The anastomosis was hemostatic.  The graft was then compressed with forceps restoring a normal palpable radial pulse at the wrist.  A large Weck clip was then placed across the graft just beyond the anastomosis and deployed thus ligating the graft.  The wound was then copiously irrigated, hemostasis assured and closure was performed with running Vicryl in the subcu cutaneous and subdermal layers and clips were placed in the skin.  Sterile dressing was applied and the patient was awakened from anesthesia and transferred recovery in satisfactory condition.  She had a palpable radial pulse at the wrist.  For future considerations, clipping of this graft today preserves the option for subsequent revision with thrombectomy and hand revascularization by distal revascularization/interval ligation procedure.  However given the significant neurologic deficit and her radial dominance, alternative access sites may remain the most prudent option.

## 2021-02-26 NOTE — Transfer of Care (Signed)
Immediate Anesthesia Transfer of Care Note  Patient: Brianna Dodson  Procedure(s) Performed: LIGATION OF ARTERIOVENOUS  FISTULA (Left)  Patient Location: PACU  Anesthesia Type:General  Level of Consciousness: awake  Airway & Oxygen Therapy: Patient Spontanous Breathing and Patient connected to face mask oxygen  Post-op Assessment: Report given to RN and Post -op Vital signs reviewed and stable  Post vital signs: Reviewed and stable  Last Vitals:  Vitals Value Taken Time  BP 157/69 02/26/21 1747  Temp    Pulse 67 02/26/21 1751  Resp 11 02/26/21 1751  SpO2 100 % 02/26/21 1751  Vitals shown include unvalidated device data.  Last Pain:  Vitals:   02/26/21 1149  TempSrc: Oral  PainSc:       Patients Stated Pain Goal: 2 (82/70/78 6754)  Complications: No notable events documented.

## 2021-02-27 ENCOUNTER — Inpatient Hospital Stay: Payer: Medicare Other

## 2021-02-27 ENCOUNTER — Encounter: Payer: Self-pay | Admitting: Vascular Surgery

## 2021-02-27 LAB — CBC
HCT: 31.5 % — ABNORMAL LOW (ref 36.0–46.0)
Hemoglobin: 11.7 g/dL — ABNORMAL LOW (ref 12.0–15.0)
MCH: 32.6 pg (ref 26.0–34.0)
MCHC: 37.1 g/dL — ABNORMAL HIGH (ref 30.0–36.0)
MCV: 87.7 fL (ref 80.0–100.0)
Platelets: 119 K/uL — ABNORMAL LOW (ref 150–400)
RBC: 3.59 MIL/uL — ABNORMAL LOW (ref 3.87–5.11)
RDW: 14.1 % (ref 11.5–15.5)
WBC: 5 K/uL (ref 4.0–10.5)
nRBC: 0 % (ref 0.0–0.2)

## 2021-02-27 LAB — BASIC METABOLIC PANEL
Anion gap: 10 (ref 5–15)
BUN: 25 mg/dL — ABNORMAL HIGH (ref 8–23)
CO2: 30 mmol/L (ref 22–32)
Calcium: 8.5 mg/dL — ABNORMAL LOW (ref 8.9–10.3)
Chloride: 95 mmol/L — ABNORMAL LOW (ref 98–111)
Creatinine, Ser: 3.88 mg/dL — ABNORMAL HIGH (ref 0.44–1.00)
GFR, Estimated: 11 mL/min — ABNORMAL LOW (ref >60)
Glucose, Bld: 149 mg/dL — ABNORMAL HIGH (ref 70–99)
Potassium: 3.4 mmol/L — ABNORMAL LOW (ref 3.5–5.1)
Sodium: 135 mmol/L (ref 135–145)

## 2021-02-27 LAB — GLUCOSE, CAPILLARY
Glucose-Capillary: 135 mg/dL — ABNORMAL HIGH (ref 70–99)
Glucose-Capillary: 209 mg/dL — ABNORMAL HIGH (ref 70–99)
Glucose-Capillary: 98 mg/dL (ref 70–99)
Glucose-Capillary: 151 mg/dL — ABNORMAL HIGH (ref 70–99)

## 2021-02-27 MED ORDER — SIMETHICONE 80 MG PO CHEW
80.0000 mg | CHEWABLE_TABLET | Freq: Four times a day (QID) | ORAL | Status: DC | PRN
  Administered 2021-03-01: 80 mg via ORAL
  Filled 2021-02-27 (×2): qty 1

## 2021-02-27 NOTE — Progress Notes (Signed)
Subjective: Interval History: has no complaint of pain in the left hand, however she is still not able to use her hand and picks it up with the right hand to move it around  Objective: Vital signs in last 24 hours: Temp:  [97.1 F (36.2 C)-98.4 F (36.9 C)] 98 F (36.7 C) (08/28 0510) Pulse Rate:  [68-88] 81 (08/28 0510) Resp:  [10-23] 16 (08/28 0510) BP: (124-190)/(51-80) 153/77 (08/28 0510) SpO2:  [97 %-100 %] 99 % (08/28 0510)  Intake/Output from previous day: 08/27 0701 - 08/28 0700 In: 50 [IV Piggyback:50] Out: -  Intake/Output this shift: No intake/output data recorded.  General appearance: alert and no distress Extremities: edema left upper extremity predominantly of the forearm and hand and with some associated bulla formation on the dorsum of the forearm which is new since operation.  Compartments feel soft Pulses: Left radial pulse 2+ palpable Neurologic: Sensory: Intact to light touch left upper extremity including all 5 fingers in the hand.  She denies numbness or paresthesias Motor: Unable to flex or extend at the left wrist nor grip left hand Unchanged from preoperative examination of the left upper extremity  Lab Results: Recent Labs    02/25/21 0841 02/27/21 0530  WBC 6.9 5.0  HGB 10.5* 11.7*  HCT 29.7* 31.5*  PLT 115* 119*   BMET Recent Labs    02/25/21 0841 02/27/21 0530  NA 139 135  K 3.6 3.4*  CL 97* 95*  CO2 32 30  GLUCOSE 225* 149*  BUN 27* 25*  CREATININE 3.40* 3.88*  CALCIUM 9.2 8.5*    Studies/Results: CT HEAD WO CONTRAST (5MM)  Result Date: 02/25/2021 CLINICAL DATA:  87 year old female with dizziness. Acute on chronic right MCA infarct in March. Dialysis patient with pain in the region of left arm AV fistula. EXAM: CT HEAD WITHOUT CONTRAST TECHNIQUE: Contiguous axial images were obtained from the base of the skull through the vertex without intravenous contrast. COMPARISON:  Brain MRI 09/12/2020.  Head CT 09/10/2020. FINDINGS: Brain:  Right MCA territory encephalomalacia. Mild ex vacuo enlargement of the ventricles. Patchy and confluent additional bilateral cerebral white matter hypodensity. No midline shift, ventriculomegaly, mass effect, evidence of mass lesion, intracranial hemorrhage or evidence of cortically based acute infarction. Vascular: Extensive Calcified atherosclerosis at the skull base. Right MCA calcified atherosclerosis. No suspicious intracranial vascular hyperdensity. Skull: No acute osseous abnormality identified. Sinuses/Orbits: Continued bubbly opacity in the right sphenoid sinus. Other Visualized paranasal sinuses and mastoids are clear. Other: No acute orbit or scalp soft tissue finding. IMPRESSION: 1. Chronic right MCA territory infarct and advanced chronic small vessel disease. 2. No acute intracranial abnormality. Electronically Signed   By: Genevie Ann M.D.   On: 02/25/2021 10:16   CT ANGIO UP EXTREM LEFT W &/OR WO CONTAST  Result Date: 02/25/2021 CLINICAL DATA:  87 year old female with a murmur at the site of AV fistula EXAM: CT ANGIOGRAPHY UPPER LEFT EXTREMITY TECHNIQUE: Axial spiral CT images were acquired of the left upper extremity after administration of a standard IV contrast bolus. The images were acquired with timing specific for the arterial system. Axial and coronal images were performed on a separate workstation CONTRAST:  33mL OMNIPAQUE IOHEXOL 350 MG/ML SOLN COMPARISON:  None. FINDINGS: VASCULAR: Axillary artery is patent with mild atherosclerosis and no high-grade stenosis or occlusion. The humeral circumflex branches remain patent. Brachial artery is patent without significant atherosclerosis throughout the upper arm. Surgical changes of arteriovenous fistula/dialysis circuit of the upper arm. The anastomosis is patent, with patent  venous outflow through the brachial vein, axillary vein, and distal subclavian vein. Distal to the anastomosis the brachial artery remains patent to the antecubital region.  Radial artery and the ulnar artery are patent to the distal forearm. There is decreased attenuation distally, which may be secondary to late arrival of the contrast bolus. The radial artery is dominant compared to the ulnar artery. A small interosseous artery is present proximally, without opacification distally. Incidentally imaged left common femoral artery appears patent as well as the proximal profunda femoris and SFA. The pelvis is not imaged. Nonvascular: Gas within the soft tissues of the left upper arm adjacent to the fistula. No pooling of contrast or extravasation of contrast. Edema within the fat planes adjacent to the musculature of the upper arm without fluid collection. Degenerative changes of the glenohumeral joint, acromioclavicular joint. No acute displaced fracture. Review of the MIP images confirms the above findings. IMPRESSION: CT angiogram demonstrates a patent left upper extremity arteriovenous dialysis circuit. If there is concern for hemodynamic assessment, directed duplex is a more sensitive and specific test, and should be considered if there is a suspicion of flow abnormality. Gas and edema within the soft tissues of the left upper arm adjacent to the dialysis circuit. While this is presumably related to usage/cannulation, soft tissue infection/cellulitis can not be excluded on CT. Signed, Dulcy Fanny. Dellia Nims, RPVI [facility] Electronically Signed   By: Corrie Mckusick D.O.   On: 02/25/2021 10:17   MR ANGIO HEAD WO CONTRAST  Result Date: 02/25/2021 CLINICAL DATA:  Follow-up examination for acute stroke. EXAM: MRA HEAD WITHOUT CONTRAST TECHNIQUE: Angiographic images of the Circle of Willis were acquired using MRA technique without intravenous contrast. COMPARISON:  Prior MRI from earlier the same day. FINDINGS: Anterior circulation: Examination moderately degraded by motion artifact. Visualized distal cervical segments of the  internal carotid arteries are patent with antegrade flow. Partially visualized cervical left ICA tortuous. Petrous segments patent bilaterally. Cavernous and supraclinoid right ICA widely patent without stenosis. 4 mm saccular outpouching arising from the cavernous right ICA consistent with a small paraophthalmic aneurysm (series 9, image 119). Atheromatous change within the left carotid siphon with associated mild-to-moderate stenosis (series 9, image 119). Left A1 widely patent. Right A1 hypoplastic and/or absent. Normal anterior communicating artery complex. Anterior cerebral arteries grossly patent to their distal aspects without stenosis. Left M1 patent. Grossly normal left MCA bifurcation. Distal left MCA branches perfused. Right M1 segment. There remains a patent small anterior right temporal branch. 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 Posterior circulation: Dominant left vertebral artery patent to the vertebrobasilar junction without stenosis. Left PICA patent. Right vertebral artery largely terminates at the level of the right PICA, although a tiny branch ascending towards the vertebrobasilar junction. Right PICA patent as well. Basilar mildly diminutive but patent to its distal aspect without stenosis. Superior cerebral arteries patent bilaterally. Fetal type origin of the PCAs bilaterally. PCAs remain well perfused to their distal aspects without obvious proximal high-grade stenosis. Anatomic variants: Fetal type origin of the PCAs. Dominant left vertebral artery. Hypoplastic/absent right A1 segment, with the anterior cerebral artery supplied via the left carotid artery system. Other: None. IMPRESSION: 1. 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 2. Atheromatous change within the left carotid siphon with associated mild-to-moderate stenosis. 3. 4 mm cavernous right ICA aneurysm. 4. Fetal type origin of the PCAs  bilaterally. Hypoplastic/absent right A1, with the anterior cerebral arteries supplied primarily via the left carotid artery system. Electronically Signed   By: Jeannine Boga M.D.   On: 02/25/2021 23:44   MR BRAIN WO CONTRAST  Result Date: 02/25/2021 CLINICAL DATA:  Stroke suspected EXAM: MRI HEAD WITHOUT CONTRAST TECHNIQUE: Multiplanar, multiecho pulse sequences of the brain and surrounding structures were obtained without intravenous contrast. COMPARISON:  09/12/2020 MRI, correlation is also made with CT head 02/25/2021. FINDINGS: Evaluation somewhat limited by motion artifact. Brain: Small area of cortical restricted diffusion in the right parietal lobe (series 5, images 33-40), within area of encephalomalacia from prior right MCA territory infarct. No acute hemorrhage, mass, mass effect, or midline shift. No extra-axial fluid collection. Redemonstrated chronic microhemorrhages in the bilateral deep gray nuclei. Confluent T2 hyperintense signal in the periventricular white matter and pons, likely the sequela of here chronic small vessel ischemic disease. Unchanged ex vacuo dilatation of the right lateral ventricle. Vascular: 08/21/2019 Skull and upper cervical spine: Normal marrow signal. Sinuses/Orbits: Status post bilateral lens replacements. Otherwise negative. Other: Trace fluid in right mastoid air cells. IMPRESSION: Evaluation is somewhat limited by motion artifact. Within this limitation, there is acute ischemia within an area of remote infarct in the right parietal lobe. No evidence of hemorrhagic transformation. These results were called by telephone at the time of interpretation on 02/25/2021 at 8:16 am to provider [facility] , who verbally acknowledged these results. Electronically Signed   By: Merilyn Baba M.D.   On: 02/25/2021 11:18   US Carotid Bilateral  Result Date: 02/26/2021 CLINICAL DATA:  Stroke symptoms, syncope hyperlipidemia, diabetes EXAM: BILATERAL CAROTID DUPLEX ULTRASOUND  TECHNIQUE: Pearline Cables scale imaging, color Doppler and duplex ultrasound were performed of bilateral carotid and vertebral arteries in the neck. COMPARISON:  None. FINDINGS: Criteria: Quantification of carotid stenosis is based on velocity parameters that correlate the residual internal carotid diameter with NASCET-based stenosis levels, using the diameter of the distal internal carotid lumen as the denominator for stenosis measurement. The following velocity measurements were obtained: RIGHT ICA: 76/12 cm/sec CCA: 34/1 cm/sec SYSTOLIC ICA/CCA RATIO:  1.4 ECA: 51 cm/sec LEFT ICA: 86/19 cm/sec CCA: 96/2 cm/sec SYSTOLIC ICA/CCA RATIO:  1.3 ECA: 161 cm/sec RIGHT CAROTID ARTERY: Scattered mild-to-moderate echogenic shadowing plaque formation. No hemodynamically significant right ICA stenosis, velocity elevation, or turbulent flow. Degree of narrowing less than 50%. RIGHT VERTEBRAL ARTERY:  Normal antegrade flow LEFT CAROTID ARTERY: Similar scattered moderate echogenic plaque formation. No hemodynamically significant left ICA stenosis, velocity elevation, or turbulent flow. LEFT VERTEBRAL ARTERY:  Normal antegrade flow IMPRESSION: Moderate bilateral carotid atherosclerosis. Negative for significant stenosis. Degree of narrowing less than 50% bilaterally by ultrasound criteria. Patent antegrade vertebral flow bilaterally Electronically Signed   By: Jerilynn Mages.  Shick M.D.   On: 02/26/2021 07:26   [place] OR NERVE BLOCK-IMAGE ONLY [facility])  Result Date: 02/24/2021 There is no interpretation for this exam.  This order is for images obtained during a surgical procedure.  Please See "Surgeries" Tab for more information regarding the procedure.   Anti-infectives: Anti-infectives (From admission, onward)    None       Assessment/Plan: s/p Procedure(s): LIGATION OF ARTERIOVENOUS  FISTULA (Left) She now has a warm and well-perfused hand and is now pain-free from that standpoint.  Unfortunately she has not regained a significant motor  function.  In addition she has developed significant forearm edema with bullous lesions of the dorsal skin.  Question etiology-ischemia reperfusion vs venous outflow occlusion after ligation of graft. Will  keep arm elevated at all times using orthopedic wedge as necessary.  Silvadene to the bulla on the forearm if they rupture.  Obtain left upper extremity venous duplex.  LOS: 1 day   Bertram Savin 02/27/2021, 9:35 AM

## 2021-02-27 NOTE — Progress Notes (Signed)
[facility]  PROGRESS NOTE   Subjective:   Patient seen at bedside. She had ligation of the left upper extremity AV graft. Shows me the left hand denies any pain at this time. She has a right permacath.  Objective:  Vital signs in last 24 hours:  Temp:  [97.1 F (36.2 C)-98.4 F (36.9 C)] 98 F (36.7 C) (08/28 0957) Pulse Rate:  [68-95] 95 (08/28 0957) Resp:  [10-23] 16 (08/28 0510) BP: (109-190)/(51-80) 109/63 (08/28 0957) SpO2:  [95 %-100 %] 95 % (08/28 0957)  Weight change:  Filed Weights   02/25/21 0816 02/25/21 1756  Weight: 65.3 kg 68.4 kg    Intake/Output: I/O last 3 completed shifts: In: 170 [P.O.:120; IV Piggyback:50] Out: -    Intake/Output this shift:  No intake/output data recorded.  Physical Exam: General:  No acute distress  Head:  Normocephalic, atraumatic. Moist oral mucosal membranes  Eyes:  Anicteric  Neck:  Supple  Lungs:   Clear to auscultation, normal effort  Heart:  S1S2 no rubs  Abdomen:   Soft, nontender, bowel sounds present  Extremities: Left upper arm edema.  Neurologic:  Awake, alert, following commands  Skin:  No lesions  Access:     Basic Metabolic Panel: Recent Labs  Lab 02/24/21 1153 02/25/21 0841 02/27/21 0530  NA 141 139 135  K 3.6 3.6 3.4*  CL 97* 97* 95*  CO2  --  32 30  GLUCOSE 156* 225* 149*  BUN 20 27* 25*  CREATININE 3.00* 3.40* 3.88*  CALCIUM  --  9.2 8.5*    CBC: Recent Labs  Lab 02/24/21 1141 02/24/21 1153 02/25/21 0841 02/27/21 0530  WBC 2.8*  --  6.9 5.0  NEUTROABS  --   --  5.6  --   HGB 10.0* 9.2* 10.5* 11.7*  HCT 28.2* 27.0* 29.7* 31.5*  MCV 88.4  --  88.1 87.7  PLT 111*  --  115* 119*     Urinalysis: No results for input(s): COLORURINE, LABSPEC, PHURINE, GLUCOSEU, HGBUR, BILIRUBINUR, KETONESUR, PROTEINUR, UROBILINOGEN, NITRITE, LEUKOCYTESUR in the last 72 hours.  Invalid input(s): APPERANCEUR    Imaging: MR ANGIO HEAD WO CONTRAST  Result Date: 02/25/2021 CLINICAL  DATA:  Follow-up examination for acute stroke. EXAM: MRA HEAD WITHOUT CONTRAST TECHNIQUE: Angiographic images of the Circle of Willis were acquired using MRA technique without intravenous contrast. COMPARISON:  Prior MRI from earlier the same day. FINDINGS: Anterior circulation: Examination moderately degraded by motion artifact. Visualized distal cervical segments of the internal carotid arteries are patent with antegrade flow. Partially visualized cervical left ICA tortuous. Petrous segments patent bilaterally. Cavernous and supraclinoid right ICA widely patent without stenosis. 4 mm saccular outpouching arising from the cavernous right ICA consistent with a small paraophthalmic aneurysm (series 9, image 119). Atheromatous change within the left carotid siphon with associated mild-to-moderate stenosis (series 9, image 119). Left A1 widely patent. Right A1 hypoplastic and/or absent. Normal anterior communicating artery complex. Anterior cerebral arteries grossly patent to their distal aspects without stenosis. Left M1 patent. Grossly normal left MCA bifurcation. Distal left MCA branches perfused. Right M1 segment. There remains a patent small anterior right temporal branch. 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 Posterior circulation: Dominant left vertebral artery patent to the vertebrobasilar junction without stenosis. Left PICA patent. Right vertebral artery largely terminates at the level of the right PICA, although a tiny branch ascending towards the vertebrobasilar junction. Right PICA patent as well. Basilar  mildly diminutive but patent to its distal aspect without stenosis. Superior cerebral arteries patent bilaterally. Fetal type origin of the PCAs bilaterally. PCAs remain well perfused to their distal aspects without obvious proximal high-grade stenosis. Anatomic variants: Fetal type origin of the PCAs. Dominant left vertebral artery.  Hypoplastic/absent right A1 segment, with the anterior cerebral artery supplied via the left carotid artery system. Other: None. IMPRESSION: 1. 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 2. Atheromatous change within the left carotid siphon with associated mild-to-moderate stenosis. 3. 4 mm cavernous right ICA aneurysm. 4. Fetal type origin of the PCAs bilaterally. Hypoplastic/absent right A1, with the anterior cerebral arteries supplied primarily via the left carotid artery system. Electronically Signed   By: Jeannine Boga M.D.   On: 02/25/2021 23:44   MR BRAIN WO CONTRAST  Result Date: 02/25/2021 CLINICAL DATA:  Stroke suspected EXAM: MRI HEAD WITHOUT CONTRAST TECHNIQUE: Multiplanar, multiecho pulse sequences of the brain and surrounding structures were obtained without intravenous contrast. COMPARISON:  09/12/2020 MRI, correlation is also made with CT head 02/25/2021. FINDINGS: Evaluation somewhat limited by motion artifact. Brain: Small area of cortical restricted diffusion in the right parietal lobe (series 5, images 33-40), within area of encephalomalacia from prior right MCA territory infarct. No acute hemorrhage, mass, mass effect, or midline shift. No extra-axial fluid collection. Redemonstrated chronic microhemorrhages in the bilateral deep gray nuclei. Confluent T2 hyperintense signal in the periventricular white matter and pons, likely the sequela of here chronic small vessel ischemic disease. Unchanged ex vacuo dilatation of the right lateral ventricle. Vascular: 08/21/2019 Skull and upper cervical spine: Normal marrow signal. Sinuses/Orbits: Status post bilateral lens replacements. Otherwise negative. Other: Trace fluid in right mastoid air cells. IMPRESSION: Evaluation is somewhat limited by motion artifact. Within this limitation, there is acute ischemia within an area of remote infarct in the right parietal lobe. No evidence of hemorrhagic transformation.  These results were called by telephone at the time of interpretation on 02/25/2021 at 8:16 am to provider [facility] , who verbally acknowledged these results. Electronically Signed   By: Merilyn Baba M.D.   On: 02/25/2021 11:18   US Carotid Bilateral  Result Date: 02/26/2021 CLINICAL DATA:  Stroke symptoms, syncope hyperlipidemia, diabetes EXAM: BILATERAL CAROTID DUPLEX ULTRASOUND TECHNIQUE: Pearline Cables scale imaging, color Doppler and duplex ultrasound were performed of bilateral carotid and vertebral arteries in the neck. COMPARISON:  None. FINDINGS: Criteria: Quantification of carotid stenosis is based on velocity parameters that correlate the residual internal carotid diameter with NASCET-based stenosis levels, using the diameter of the distal internal carotid lumen as the denominator for stenosis measurement. The following velocity measurements were obtained: RIGHT ICA: 76/12 cm/sec CCA: 00/9 cm/sec SYSTOLIC ICA/CCA RATIO:  1.4 ECA: 51 cm/sec LEFT ICA: 86/19 cm/sec CCA: 38/1 cm/sec SYSTOLIC ICA/CCA RATIO:  1.3 ECA: 161 cm/sec RIGHT CAROTID ARTERY: Scattered mild-to-moderate echogenic shadowing plaque formation. No hemodynamically significant right ICA stenosis, velocity elevation, or turbulent flow. Degree of narrowing less than 50%. RIGHT VERTEBRAL ARTERY:  Normal antegrade flow LEFT CAROTID ARTERY: Similar scattered moderate echogenic plaque formation. No hemodynamically significant left ICA stenosis, velocity elevation, or turbulent flow. LEFT VERTEBRAL ARTERY:  Normal antegrade flow IMPRESSION: Moderate bilateral carotid atherosclerosis. Negative for significant stenosis. Degree of narrowing less than 50% bilaterally by ultrasound criteria. Patent antegrade vertebral flow bilaterally Electronically Signed   By: Jerilynn Mages.  Shick M.D.   On: 02/26/2021 07:26     Medications:    sodium chloride     sodium  chloride      amLODipine  5 mg Oral Daily   apixaban  2.5 mg Oral BID   atorvastatin  80 mg Oral Daily    carvedilol  6.25 mg Oral BID WC   Chlorhexidine Gluconate Cloth  6 each Topical Q0600   ezetimibe  10 mg Oral Daily   ferrous sulfate  325 mg Oral Q1500   gabapentin  100 mg Oral QHS   hydrALAZINE  50 mg Oral BID   insulin aspart  0-5 Units Subcutaneous QHS   insulin aspart  0-9 Units Subcutaneous TID WC   mirtazapine  7.5 mg Oral QHS    Assessment/ Plan:     Principal Problem:   Stroke [facility]) Active Problems:   Essential hypertension   HLD (hyperlipidemia)   Type II diabetes mellitus with renal manifestations (HCC)   Anemia in ESRD (end-stage renal disease) (HCC)   Atrial fibrillation (HCC)   End stage renal disease on dialysis ([place])   Left arm pain   Thrombocytopenia (HCC)   Renal dialysis device, implant, or graft complication  [name] is a 87 y.o.  female with past medical history of atrial fib, anemia, hypertension, hyperlipidemia, diabetes, stroke, and ESRD on dialysis. Patient presents to ED with complaints of left arm pain.    [facility] [address]/MWF/Rt Permcath   Hypertension  Home regimen includes amlodipine, carvedilol, and hydralazine. All held at this time   2. End stage renal disease on dialysis Will maintain outpatient schedule if possible Had stable treatment on Friday. Next treatment on Monday   3. Anemia of chronic kidney disease   4. Secondary Hyperparathyroidism: continue to monitor   5. Left arm pain after AVG placement.  Patient is now s/p ligation of the AV graft.   6. CVA. PT and OT. Continue present care.    LOS: [place], MD [facility] 8/28/202210:32 AM

## 2021-02-27 NOTE — Progress Notes (Addendum)
PROGRESS NOTE    Brianna Dodson  XBM:[id number] DOB: [birth year] DOA: 02/25/2021 PCP: [name], MD   Brief Narrative:  Brianna Dodson is a 87 y.o. female with medical history significant of ESRD-HD (MWF), hypertension, hyperlipidemia, diabetes mellitus,'s embolic stroke, atrial fibrillation on Eliquis, depression, OSA, HCV, anemia, Alzheimer's disease, who presents with left arm pain and weakness. Pt had left arm AV graft placement procedure 8/25 with vascular surgery.  After she went home, she developed severe pain in the left arm, which is constant, sharp, throbbing-like, nonradiating.  Patient also reports weakness in left forearm, particularly in the left wrist and hand. She has been off Eliquis from 8/22-8/25 for surgical procedure.  She has not restarted Eliquis yet. In ED MRI showed possible new stroke. Pt is placed in med-surg bed for obs. Dr. [name] of [facility], Dr. Juleen China of renal, Dr. Rory Percy of neuro are consulted.  Assessment & Plan:   Acute right parietal lobe infarct, POA - Neurology and Vascular following - appreciate insight and recs - Continue neuro checks, defer repeat imaging per consults - OK to continue Eliquis per vascular - A1C 6.8 - Lipid panel WNL - PT/OT consult  Left arm pain after AV graft placement:  - Vascular surgery following, appreciate insight and recommendations  - Concern for steal syndrome given imaging -this post ligation of the AV graft  Essential hypertension - IV hydralazine as needed - Amlodipine, Coreg, hydralazine   ESRD-HD: - Nephrology consulted for dialysis   HLD (hyperlipidemia) - Lipitor and Zetia   Type II diabetes mellitus with renal manifestations ([place]):  - Recent A1c 6.8, borderline controlled.  Patient is taking Tradjenta - Sliding scale insulin   Anemia in ESRD (end-stage renal disease) ([place]): Hemoglobin stable, 10.5 -Continue iron supplement   Atrial fibrillation ([place]) -Coreg -Hold Eliquis in the setting of procedure as  above    Thrombocytopenia ([place]):  - This is chronic issue.  Hemoglobin 115.  No bleeding tendency. - Follow-up with CBC   DVT prophylaxis: SCDs only, hold anticoagulation given above Code Status: Partial, DO NOT INTUBATE Family Communication: None present  Status is: Inpatient  Dispo: The patient is from: Home              Anticipated d/c is to: Home              Anticipated d/c date is: 40 to 72 hours              Patient currently not medically stable for discharge  Consultants:  Nephrology, neuro, vascular surgery  Procedures:  Tentative plan for graft ligation  Antimicrobials:  None indicated  Subjective: No acute issues or events overnight denies nausea vomiting diarrhea constipation headache fevers chills or chest pain  Objective: Vitals:   02/26/21 1810 02/26/21 1843 02/26/21 1933 02/27/21 0510  BP: (!) 170/80 (!) 190/77 (!) 157/72 (!) 153/77  Pulse: 75 75 75 81  Resp: (!) 23 16 16 16   Temp:  98.1 F (36.7 C) 98.4 F (36.9 C) 98 F (36.7 C)  TempSrc:      SpO2: 100% 97% 100% 99%  Weight:      Height:        Intake/Output Summary (Last 24 hours) at 02/27/2021 0737 Last data filed at 02/26/2021 1721 Gross per 24 hour  Intake 50 ml  Output --  Net 50 ml    Filed Weights   02/25/21 0816 02/25/21 1756  Weight: 65.3 kg 68.4 kg    Examination:  General:  Pleasantly resting in bed, No acute distress. HEENT:  Normocephalic atraumatic.  Sclerae nonicteric, noninjected.  Extraocular movements intact bilaterally. Neck:  Without mass or deformity.  Trachea is midline. Lungs:  Clear to auscultate bilaterally without rhonchi, wheeze, or rales. Heart:  Regular rate and rhythm.  Without murmurs, rubs, or gallops. Abdomen:  Soft, nontender, nondistended.  Without guarding or rebound. Extremities: Left arm flaccid distal to the elbow, sensation intact and equal bilaterally otherwise; notable bullae lateral left forearm Vascular:  Dorsalis pedis and posterior  tibial pulses palpable bilaterally. Skin:  Warm and dry, no erythema, no ulcerations.   Data Reviewed: I have personally reviewed following labs and imaging studies  CBC: Recent Labs  Lab 02/24/21 1141 02/24/21 1153 02/25/21 0841 02/27/21 0530  WBC 2.8*  --  6.9 5.0  NEUTROABS  --   --  5.6  --   HGB 10.0* 9.2* 10.5* 11.7*  HCT 28.2* 27.0* 29.7* 31.5*  MCV 88.4  --  88.1 87.7  PLT 111*  --  115* 119*    Basic Metabolic Panel: Recent Labs  Lab 02/24/21 1153 02/25/21 0841 02/27/21 0530  NA 141 139 135  K 3.6 3.6 3.4*  CL 97* 97* 95*  CO2  --  32 30  GLUCOSE 156* 225* 149*  BUN 20 27* 25*  CREATININE 3.00* 3.40* 3.88*  CALCIUM  --  9.2 8.5*    GFR: Estimated Creatinine Clearance: 10.3 mL/min (A) (by C-G formula based on SCr of 3.88 mg/dL (H)). Liver Function Tests: No results for input(s): AST, ALT, ALKPHOS, BILITOT, PROT, ALBUMIN in the last 168 hours. No results for input(s): LIPASE, AMYLASE in the last 168 hours. No results for input(s): AMMONIA in the last 168 hours. Coagulation Profile: No results for input(s): INR, PROTIME in the last 168 hours. Cardiac Enzymes: No results for input(s): CKTOTAL, CKMB, CKMBINDEX, TROPONINI in the last 168 hours. BNP (last 3 results) No results for input(s): PROBNP in the last 8760 hours. HbA1C: Recent Labs    02/26/21 0502  HGBA1C 6.1*   CBG: Recent Labs  Lab 02/25/21 2316 02/26/21 0849 02/26/21 1151 02/26/21 1635 02/26/21 2317  GLUCAP 128* 125* 158* 133* 215*    Lipid Profile: Recent Labs    02/26/21 0502  CHOL 110  HDL 47  LDLCALC 49  TRIG 68  CHOLHDL 2.3    Thyroid Function Tests: No results for input(s): TSH, T4TOTAL, FREET4, T3FREE, THYROIDAB in the last 72 hours. Anemia Panel: No results for input(s): VITAMINB12, FOLATE, FERRITIN, TIBC, IRON, RETICCTPCT in the last 72 hours. Sepsis Labs: No results for input(s): PROCALCITON, LATICACIDVEN in the last 168 hours.  Recent Results (from the past  240 hour(s))  Resp Panel by RT-PCR (Flu A&B, Covid) Nasopharyngeal Swab     Status: None   Collection Time: 02/25/21 12:01 PM   Specimen: Nasopharyngeal Swab; Nasopharyngeal(NP) swabs in vial transport medium  Result Value Ref Range Status   SARS Coronavirus 2 by RT PCR NEGATIVE NEGATIVE Final    Comment: (NOTE) SARS-CoV-2 target nucleic acids are NOT DETECTED.  The SARS-CoV-2 RNA is generally detectable in upper respiratory specimens during the acute phase of infection. The lowest concentration of SARS-CoV-2 viral copies this assay can detect is 138 copies/mL. A negative result does not preclude SARS-Cov-2 infection and should not be used as the sole basis for treatment or other patient management decisions. A negative result may occur with  improper specimen collection/handling, submission of specimen other than nasopharyngeal swab, presence of viral mutation(s) within the  areas targeted by this assay, and inadequate number of viral copies(<138 copies/mL). A negative result must be combined with clinical observations, patient history, and epidemiological information. The expected result is Negative.  Fact Sheet for Patients:  EntrepreneurPulse.com.au  Fact Sheet for Healthcare Providers:  IncredibleEmployment.be  This test is no t yet approved or cleared by the [place] FDA and  has been authorized for detection and/or diagnosis of SARS-CoV-2 by FDA under an Emergency Use Authorization (EUA). This EUA will remain  in effect (meaning this test can be used) for the duration of the COVID-19 declaration under Section 564(b)(1) of the Act, 21 U.S.C.section 360bbb-3(b)(1), unless the authorization is terminated  or revoked sooner.       Influenza A by PCR NEGATIVE NEGATIVE Final   Influenza B by PCR NEGATIVE NEGATIVE Final    Comment: (NOTE) The Xpert Xpress SARS-CoV-2/FLU/RSV plus assay is intended as an aid in the diagnosis of influenza  from Nasopharyngeal swab specimens and should not be used as a sole basis for treatment. Nasal washings and aspirates are unacceptable for Xpert Xpress SARS-CoV-2/FLU/RSV testing.  Fact Sheet for Patients: EntrepreneurPulse.com.au  Fact Sheet for Healthcare Providers: IncredibleEmployment.be  This test is not yet approved or cleared by the [place] FDA and has been authorized for detection and/or diagnosis of SARS-CoV-2 by FDA under an Emergency Use Authorization (EUA). This EUA will remain in effect (meaning this test can be used) for the duration of the COVID-19 declaration under Section 564(b)(1) of the Act, 21 U.S.C. section 360bbb-3(b)(1), unless the authorization is terminated or revoked.  Performed at [facility], [address] [place], [place] [address]           Radiology Studies: CT HEAD WO CONTRAST (5MM)  Result Date: 02/25/2021 CLINICAL DATA:  87 year old female with dizziness. Acute on chronic right MCA infarct in March. Dialysis patient with pain in the region of left arm AV fistula. EXAM: CT HEAD WITHOUT CONTRAST TECHNIQUE: Contiguous axial images were obtained from the base of the skull through the vertex without intravenous contrast. COMPARISON:  Brain MRI 09/12/2020.  Head CT 09/10/2020. FINDINGS: Brain: Right MCA territory encephalomalacia. Mild ex vacuo enlargement of the ventricles. Patchy and confluent additional bilateral cerebral white matter hypodensity. No midline shift, ventriculomegaly, mass effect, evidence of mass lesion, intracranial hemorrhage or evidence of cortically based acute infarction. Vascular: Extensive Calcified atherosclerosis at the skull base. Right MCA calcified atherosclerosis. No suspicious intracranial vascular hyperdensity. Skull: No acute osseous abnormality identified. Sinuses/Orbits: Continued bubbly opacity in the right sphenoid sinus. Other Visualized paranasal sinuses and  mastoids are clear. Other: No acute orbit or scalp soft tissue finding. IMPRESSION: 1. Chronic right MCA territory infarct and advanced chronic small vessel disease. 2. No acute intracranial abnormality. Electronically Signed   By: Genevie Ann M.D.   On: 02/25/2021 10:16   CT ANGIO UP EXTREM LEFT W &/OR WO CONTAST  Result Date: 02/25/2021 CLINICAL DATA:  87 year old female with a murmur at the site of AV fistula EXAM: CT ANGIOGRAPHY UPPER LEFT EXTREMITY TECHNIQUE: Axial spiral CT images were acquired of the left upper extremity after administration of a standard IV contrast bolus. The images were acquired with timing specific for the arterial system. Axial and coronal images were performed on a separate workstation CONTRAST:  1mL OMNIPAQUE IOHEXOL 350 MG/ML SOLN COMPARISON:  None. FINDINGS: VASCULAR: Axillary artery is patent with mild atherosclerosis and no high-grade stenosis or occlusion. The humeral circumflex branches remain patent. Brachial artery is patent without significant atherosclerosis  throughout the upper arm. Surgical changes of arteriovenous fistula/dialysis circuit of the upper arm. The anastomosis is patent, with patent venous outflow through the brachial vein, axillary vein, and distal subclavian vein. Distal to the anastomosis the brachial artery remains patent to the antecubital region. Radial artery and the ulnar artery are patent to the distal forearm. There is decreased attenuation distally, which may be secondary to late arrival of the contrast bolus. The radial artery is dominant compared to the ulnar artery. A small interosseous artery is present proximally, without opacification distally. Incidentally imaged left common femoral artery appears patent as well as the proximal profunda femoris and SFA. The pelvis is not imaged. Nonvascular: Gas within the soft tissues of the left upper arm adjacent to the fistula. No pooling of contrast or extravasation of contrast. Edema within the fat  planes adjacent to the musculature of the upper arm without fluid collection. Degenerative changes of the glenohumeral joint, acromioclavicular joint. No acute displaced fracture. Review of the MIP images confirms the above findings. IMPRESSION: CT angiogram demonstrates a patent left upper extremity arteriovenous dialysis circuit. If there is concern for hemodynamic assessment, directed duplex is a more sensitive and specific test, and should be considered if there is a suspicion of flow abnormality. Gas and edema within the soft tissues of the left upper arm adjacent to the dialysis circuit. While this is presumably related to usage/cannulation, soft tissue infection/cellulitis can not be excluded on CT. Signed, Dulcy Fanny. Dellia Nims, RPVI [facility] Radiology Electronically Signed   By: Corrie Mckusick D.O.   On: 02/25/2021 10:17   MR ANGIO HEAD WO CONTRAST  Result Date: 02/25/2021 CLINICAL DATA:  Follow-up examination for acute stroke. EXAM: MRA HEAD WITHOUT CONTRAST TECHNIQUE: Angiographic images of the Circle of Willis were acquired using MRA technique without intravenous contrast. COMPARISON:  Prior MRI from earlier the same day. FINDINGS: Anterior circulation: Examination moderately degraded by motion artifact. Visualized distal cervical segments of the internal carotid arteries are patent with antegrade flow. Partially visualized cervical left ICA tortuous. Petrous segments patent bilaterally. Cavernous and supraclinoid right ICA widely patent without stenosis. 4 mm saccular outpouching arising from the cavernous right ICA consistent with a small paraophthalmic aneurysm (series 9, image 119). Atheromatous change within the left carotid siphon with associated mild-to-moderate stenosis (series 9, image 119). Left A1 widely patent. Right A1 hypoplastic and/or absent. Normal anterior communicating artery complex. Anterior cerebral arteries grossly patent to  their distal aspects without stenosis. Left M1 patent. Grossly normal left MCA bifurcation. Distal left MCA branches perfused. Right M1 segment. There remains a patent small anterior right temporal branch. 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 Posterior circulation: Dominant left vertebral artery patent to the vertebrobasilar junction without stenosis. Left PICA patent. Right vertebral artery largely terminates at the level of the right PICA, although a tiny branch ascending towards the vertebrobasilar junction. Right PICA patent as well. Basilar mildly diminutive but patent to its distal aspect without stenosis. Superior cerebral arteries patent bilaterally. Fetal type origin of the PCAs bilaterally. PCAs remain well perfused to their distal aspects without obvious proximal high-grade stenosis. Anatomic variants: Fetal type origin of the PCAs. Dominant left vertebral artery. Hypoplastic/absent right A1 segment, with the anterior cerebral artery supplied via the left carotid artery system. Other: None. IMPRESSION: 1. 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 2. Atheromatous change within the left carotid  siphon with associated mild-to-moderate stenosis. 3. 4 mm cavernous right ICA aneurysm. 4. Fetal type origin of the PCAs bilaterally. Hypoplastic/absent right A1, with the anterior cerebral arteries supplied primarily via the left carotid artery system. Electronically Signed   By: Jeannine Boga M.D.   On: 02/25/2021 23:44   MR BRAIN WO CONTRAST  Result Date: 02/25/2021 CLINICAL DATA:  Stroke suspected EXAM: MRI HEAD WITHOUT CONTRAST TECHNIQUE: Multiplanar, multiecho pulse sequences of the brain and surrounding structures were obtained without intravenous contrast. COMPARISON:  09/12/2020 MRI, correlation is also made with CT head 02/25/2021. FINDINGS: Evaluation somewhat limited by motion artifact. Brain: Small  area of cortical restricted diffusion in the right parietal lobe (series 5, images 33-40), within area of encephalomalacia from prior right MCA territory infarct. No acute hemorrhage, mass, mass effect, or midline shift. No extra-axial fluid collection. Redemonstrated chronic microhemorrhages in the bilateral deep gray nuclei. Confluent T2 hyperintense signal in the periventricular white matter and pons, likely the sequela of here chronic small vessel ischemic disease. Unchanged ex vacuo dilatation of the right lateral ventricle. Vascular: 08/21/2019 Skull and upper cervical spine: Normal marrow signal. Sinuses/Orbits: Status post bilateral lens replacements. Otherwise negative. Other: Trace fluid in right mastoid air cells. IMPRESSION: Evaluation is somewhat limited by motion artifact. Within this limitation, there is acute ischemia within an area of remote infarct in the right parietal lobe. No evidence of hemorrhagic transformation. These results were called by telephone at the time of interpretation on 02/25/2021 at 8:16 am to provider [facility] , who verbally acknowledged these results. Electronically Signed   By: Merilyn Baba M.D.   On: 02/25/2021 11:18   US Carotid Bilateral  Result Date: 02/26/2021 CLINICAL DATA:  Stroke symptoms, syncope hyperlipidemia, diabetes EXAM: BILATERAL CAROTID DUPLEX ULTRASOUND TECHNIQUE: Pearline Cables scale imaging, color Doppler and duplex ultrasound were performed of bilateral carotid and vertebral arteries in the neck. COMPARISON:  None. FINDINGS: Criteria: Quantification of carotid stenosis is based on velocity parameters that correlate the residual internal carotid diameter with NASCET-based stenosis levels, using the diameter of the distal internal carotid lumen as the denominator for stenosis measurement. The following velocity measurements were obtained: RIGHT ICA: 76/12 cm/sec CCA: 56/3 cm/sec SYSTOLIC ICA/CCA RATIO:  1.4 ECA: 51 cm/sec LEFT ICA: 86/19 cm/sec CCA: 89/3 cm/sec  SYSTOLIC ICA/CCA RATIO:  1.3 ECA: 161 cm/sec RIGHT CAROTID ARTERY: Scattered mild-to-moderate echogenic shadowing plaque formation. No hemodynamically significant right ICA stenosis, velocity elevation, or turbulent flow. Degree of narrowing less than 50%. RIGHT VERTEBRAL ARTERY:  Normal antegrade flow LEFT CAROTID ARTERY: Similar scattered moderate echogenic plaque formation. No hemodynamically significant left ICA stenosis, velocity elevation, or turbulent flow. LEFT VERTEBRAL ARTERY:  Normal antegrade flow IMPRESSION: Moderate bilateral carotid atherosclerosis. Negative for significant stenosis. Degree of narrowing less than 50% bilaterally by ultrasound criteria. Patent antegrade vertebral flow bilaterally Electronically Signed   By: Jerilynn Mages.  Shick M.D.   On: 02/26/2021 07:26    Scheduled Meds:  amLODipine  5 mg Oral Daily   apixaban  2.5 mg Oral BID   atorvastatin  80 mg Oral Daily   carvedilol  6.25 mg Oral BID WC   Chlorhexidine Gluconate Cloth  6 each Topical Q0600   ezetimibe  10 mg Oral Daily   ferrous sulfate  325 mg Oral Q1500   gabapentin  100 mg Oral QHS   hydrALAZINE  50 mg Oral BID   insulin aspart  0-5 Units Subcutaneous QHS   insulin aspart  0-9 Units Subcutaneous TID WC   mirtazapine  7.5 mg Oral QHS   Continuous Infusions:  sodium chloride     sodium chloride       LOS: 1 day   Time spent: 1min  Junko Ohagan C Staton Markey, DO [facility]  If 7PM-7AM, please contact night-coverage [email or web address]  02/27/2021, 7:37 AM

## 2021-02-27 NOTE — Progress Notes (Signed)
I reviewed Radiologist report and images from Left Upper extremity venous duplex on PACS. There is no venous outflow occlusion.  The graft is occluded as intended after ligation.There is a moderate hematoma around the venous anastomosis in the upper arm (4x2 cm). (I do not believe this is an acute finding that explains edema).  For now, continue elevation to improve edema and continue her low does DOAC anticoagulation.

## 2021-02-27 NOTE — Progress Notes (Signed)
PT Cancellation Note  Patient Details Name: Brianna Dodson MRN: [id number] DOB: 1934/02/01   Cancelled Treatment:    Reason Eval/Treat Not Completed: Patient at procedure or test/unavailable Attempted to see pt for PT tx but pt off unit for x-ray. Will f/u as able & as pt is available.  Lavone Nian, PT, DPT 02/27/21, 10:35 AM    [name] 02/27/2021, 10:35 AM

## 2021-02-28 ENCOUNTER — Encounter: Payer: Self-pay | Admitting: Vascular Surgery

## 2021-02-28 DIAGNOSIS — T829XXD Unspecified complication of cardiac and vascular prosthetic device, implant and graft, subsequent encounter: Secondary | ICD-10-CM

## 2021-02-28 LAB — GLUCOSE, CAPILLARY
Glucose-Capillary: 139 mg/dL — ABNORMAL HIGH (ref 70–99)
Glucose-Capillary: 133 mg/dL — ABNORMAL HIGH (ref 70–99)
Glucose-Capillary: 183 mg/dL — ABNORMAL HIGH (ref 70–99)
Glucose-Capillary: 148 mg/dL — ABNORMAL HIGH (ref 70–99)

## 2021-02-28 LAB — BASIC METABOLIC PANEL
Anion gap: 9 (ref 5–15)
BUN: 38 mg/dL — ABNORMAL HIGH (ref 8–23)
CO2: 28 mmol/L (ref 22–32)
Calcium: 8 mg/dL — ABNORMAL LOW (ref 8.9–10.3)
Chloride: 96 mmol/L — ABNORMAL LOW (ref 98–111)
Creatinine, Ser: 5.67 mg/dL — ABNORMAL HIGH (ref 0.44–1.00)
GFR, Estimated: 7 mL/min — ABNORMAL LOW (ref >60)
Glucose, Bld: 138 mg/dL — ABNORMAL HIGH (ref 70–99)
Potassium: 3.9 mmol/L (ref 3.5–5.1)
Sodium: 133 mmol/L — ABNORMAL LOW (ref 135–145)

## 2021-02-28 LAB — CBC
HCT: 27.6 % — ABNORMAL LOW (ref 36.0–46.0)
Hemoglobin: 9.7 g/dL — ABNORMAL LOW (ref 12.0–15.0)
MCH: 31.9 pg (ref 26.0–34.0)
MCHC: 35.1 g/dL (ref 30.0–36.0)
MCV: 90.8 fL (ref 80.0–100.0)
Platelets: 112 10*3/uL — ABNORMAL LOW (ref 150–400)
RBC: 3.04 MIL/uL — ABNORMAL LOW (ref 3.87–5.11)
RDW: 14.6 % (ref 11.5–15.5)
WBC: 5.4 10*3/uL (ref 4.0–10.5)
nRBC: 0 % (ref 0.0–0.2)

## 2021-02-28 LAB — PHOSPHORUS: Phosphorus: 3.6 mg/dL (ref 2.5–4.6)

## 2021-02-28 MED ORDER — ZINC OXIDE 40 % EX OINT
TOPICAL_OINTMENT | CUTANEOUS | Status: DC | PRN
  Filled 2021-02-28: qty 113

## 2021-02-28 MED ORDER — ACETAMINOPHEN 325 MG PO TABS
650.0000 mg | ORAL_TABLET | Freq: Four times a day (QID) | ORAL | Status: DC | PRN
  Administered 2021-03-02: 18:00:00 650 mg via ORAL
  Filled 2021-02-28 (×2): qty 2

## 2021-02-28 MED ORDER — HEPARIN SODIUM (PORCINE) 1000 UNIT/ML IJ SOLN
INTRAMUSCULAR | Status: AC
  Filled 2021-02-28: qty 1

## 2021-02-28 NOTE — Progress Notes (Signed)
Patient tolerated 3 hours of HD treatment via R permacath. OL UF goal achieved. Report to floor RN

## 2021-02-28 NOTE — Progress Notes (Signed)
Physical Therapy Treatment Patient Details Name: Brianna Dodson MRN: [id number] DOB: [birth year] Today's Date: 02/28/2021    History of Present Illness Pt is an 87 y/o F with PMH: multiple strokes most recent right MCA stroke in March 2022 with residual left-sided weakness, with worsening left-sided weakness after left upper extremity AV fistula surgery on 8/25 (Dr. [name]). Pt presented to ED d/t L arm pain/throbbing starting the night after the procedure. MRI reviewed by neurology and assessment as follows: "although formal radiology read is concerning for acute on chronic infarction it looks like cortical laminar necrosis and not like a new stroke.  She does have left hand grip weakness and left wrist drop which is new from before, but I do not suspect that this is from a new stroke." Pt now s/p ligation of L UE HD cath site to improve perfusion on 02/26/21.    PT Comments    Pt is making good progress towards goals with pt motivated to participate. Pt still appears limited in L UE with increased edema noted and pain with movement. Assisted in repositioning once returned to bed for proper elevation. SPC brought to room with pt able to ambulate increased distance this date, although very unsteady requiring increased assist this date. At this time, unsafe to dc home as pt very high falls risk. Currently recommending SNF post hospital dc. Will continue to progress as able.   Follow Up Recommendations  SNF     Equipment Recommendations  None recommended by PT    Recommendations for Other Services       Precautions / Restrictions Precautions Precautions: Fall Restrictions Weight Bearing Restrictions: No    Mobility  Bed Mobility Overal bed mobility: Needs Assistance Bed Mobility: Supine to Sit     Supine to sit: Min assist;Mod assist Sit to supine: Min assist   General bed mobility comments: needs cues for sequencing. Decreased use of L UE, needed more assist with trunk control. Once  seated, sling donned for L UE protection. CUes for upright posture as she demonstrates poor balance    Transfers Overall transfer level: Needs assistance Equipment used: Straight cane Transfers: Sit to/from Stand Sit to Stand: Min assist         General transfer comment: cues for sequencing. Once standing, generally unsteady needing hands on assist  Ambulation/Gait Ambulation/Gait assistance: Min assist Gait Distance (Feet): 80 Feet Assistive device: Straight cane Gait Pattern/deviations: Step-through pattern     General Gait Details: ambulated in hallway with unsteady gait pattern. Decreased use of L arm sliding out of sling. Hands on assist for balance with 1 LOB noted requiring min assist for correct. Quick fatigue with pt requesting to return back to room   Stairs             Wheelchair Mobility    Modified Rankin (Stroke Patients Only)       Balance Overall balance assessment: Needs assistance Sitting-balance support: Feet supported Sitting balance-Leahy Scale: Fair     Standing balance support: Single extremity supported Standing balance-Leahy Scale: Poor                              Cognition Arousal/Alertness: Awake/alert Behavior During Therapy: WFL for tasks assessed/performed Overall Cognitive Status: History of cognitive impairments - at baseline  General Comments: pleasant and agreeable to therapy. Baseline confusion      Exercises Other Exercises Other Exercises: seated ther-ex performed on B LE including alt marching and LAQ. All ther-ex performed x 10 reps    General Comments        Pertinent Vitals/Pain Pain Assessment: Faces Faces Pain Scale: Hurts little more Pain Location: L UE with movement Pain Descriptors / Indicators: Grimacing;Guarding Pain Intervention(s): Limited activity within patient's tolerance    Home Living                      Prior Function             PT Goals (current goals can now be found in the care plan section) Acute Rehab PT Goals Patient Stated Goal: to go home PT Goal Formulation: With patient Time For Goal Achievement: 03/12/21 Potential to Achieve Goals: Good Progress towards PT goals: Progressing toward goals    Frequency    7X/week      PT Plan Discharge plan needs to be updated    Co-evaluation              AM-PAC PT "6 Clicks" Mobility   Outcome Measure  Help needed turning from your back to your side while in a flat bed without using bedrails?: A Little Help needed moving from lying on your back to sitting on the side of a flat bed without using bedrails?: A Little Help needed moving to and from a bed to a chair (including a wheelchair)?: A Little Help needed standing up from a chair using your arms (e.g., wheelchair or bedside chair)?: A Little Help needed to walk in hospital room?: A Little Help needed climbing 3-5 steps with a railing? : A Lot 6 Click Score: 17    End of Session Equipment Utilized During Treatment: Gait belt Activity Tolerance: Patient tolerated treatment well Patient left: in bed;with bed alarm set Nurse Communication: Mobility status PT Visit Diagnosis: Unsteadiness on feet (R26.81);Muscle weakness (generalized) (M62.81);History of falling (Z91.81);Difficulty in walking, not elsewhere classified (R26.2)     Time: 5110-2111 PT Time Calculation (min) (ACUTE ONLY): 17 min  Charges:  $Gait Training: 8-22 mins                     Greggory Stallion, [place], DPT [phone]    Brianna Dodson 02/28/2021, 4:15 PM

## 2021-02-28 NOTE — Progress Notes (Signed)
PT Cancellation Note  Patient Details Name: Tarita Deshmukh MRN: [id number] DOB: 01/24/1934   Cancelled Treatment:    Reason Eval/Treat Not Completed: Other (comment). Pt currently off unit for HD not available at this time for PT. Will re-attempt in PM.   Jaella Weinert 02/28/2021, 10:55 AM Greggory Stallion, PT, DPT [phone]

## 2021-02-28 NOTE — TOC Progression Note (Signed)
Transition of Care [facility]) - Progression Note    Patient Details  Name: Brianna Dodson MRN: [id number] Date of Birth: Oct 30, 1933  Transition of Care [facility]) CM/SW [place], RN Phone Number: 02/28/2021, 4:31 PM  Clinical Narrative:   Patient now has SNF recommendation.  She and her family accept a bed at [facility], which is close to her family.  [facility] Dunn place aware, awaiting word of discharge.    Expected Discharge Plan: [place] Barriers to Discharge: Continued Medical Work up  Expected Discharge Plan and Services Expected Discharge Plan: [place]   Discharge Planning Services: CM Consult Post Acute Care Choice: Home Health, Resumption of Svcs/PTA Provider Living arrangements for the past 2 months: Single Family Home                 DME Arranged: N/A DME Agency: NA       HH Arranged: RN, PT, OT, Nurse's Aide [place] Agency: [place] Date HH Agency Contacted: 02/26/21 Time [place]: 1130 Representative spoke with at [place]: [place] ([place]) Interventions    Readmission Risk Interventions Readmission Risk Prevention Plan 03/16/2019  Transportation Screening Complete  PCP or Specialist Appt within 5-7 Days Complete  Home Care Screening Complete  Medication Review (RN CM) Complete  Some recent data might be hidden

## 2021-02-28 NOTE — Progress Notes (Signed)
Brianna Dodson Daily Progress Note   02/26/21: Ligation of left upper extremity brachial axillary graft  02/24/21: Left upper arm brachial artery to axillary vein arteriovenous graft  Subjective: Patient continues to complain of left upper extremity discomfort.  No acute issues overnight.  Objective: Vitals:   02/28/21 1215 02/28/21 1230 02/28/21 1245 02/28/21 1304  BP: 125/70   125/66  Pulse: 79 79 82 91  Resp: 15 16 14 18   Temp:    97.8 F (36.6 C)  TempSrc:      SpO2: 99% 99%  100%  Weight:      Height:        Intake/Output Summary (Last 24 hours) at 02/28/2021 1329 Last data filed at 02/28/2021 1230 Gross per 24 hour  Intake 240 ml  Output 0 ml  Net 240 ml   Physical Exam: Has a past medical history of dementia.  Will answer to her name.  Answer some questions appropriately. NAD CV: RRR Pulmonary: CTA Bilaterally Abdomen: Soft, Nontender, Nondistended Vascular:  Left upper extremity.  Arm is soft as it was on Friday.  Palpable radial pulse.  Hand is warm.  Sensation is intact.  Motor function is not.  Scattered blisters on the arm.  Surgical incisions are healing well.   Laboratory: CBC    Component Value Date/Time   WBC 5.4 02/28/2021 0502   HGB 9.7 (L) 02/28/2021 0502   HGB 12.1 11/09/2013 1540   HCT 27.6 (L) 02/28/2021 0502   HCT 36.3 11/09/2013 1540   PLT 112 (L) 02/28/2021 0502   PLT 183 11/09/2013 1540   BMET    Component Value Date/Time   NA 133 (L) 02/28/2021 0617   NA 142 11/10/2013 0522   K 3.9 02/28/2021 0617   K 4.6 11/10/2013 0522   CL 96 (L) 02/28/2021 0617   CL 110 (H) 11/10/2013 0522   CO2 28 02/28/2021 0617   CO2 27 11/10/2013 0522   GLUCOSE 138 (H) 02/28/2021 0617   GLUCOSE 70 11/10/2013 0522   BUN 38 (H) 02/28/2021 0617   BUN 22 (H) 11/10/2013 0522   CREATININE 5.67 (H) 02/28/2021 0617   CREATININE 1.12 11/10/2013 0522   CALCIUM 8.0 (L) 02/28/2021 0617   CALCIUM 8.4 (L) 11/10/2013 0522   GFRNONAA 7 (L)  02/28/2021 0617   GFRNONAA 47 (L) 11/10/2013 0522   GFRAA 27 (L) 08/22/2019 0449   GFRAA 54 (L) 11/10/2013 0522   Assessment/Planning: The patient is an 87 year old female with known history of end-stage renal disease who initially underwent creation of a brachiocephalic AV fistula to the left upper extremity.  His fistula never matured and a axillobrachial graft was created on Thursday.  Patient returned back to the [facility] emergency department Thursday evening complaining of left upper extremity pain.  1) patient is now status post ligation of her brachial axillary AV graft which was created on Thursday.  Ligation occurred on Saturday.  Again, the extremity is soft as it was during my initial exam on Friday.  2+ radial pulse.  Hand is warm.  Sensation is intact.  Motor is not.  At this point, the patient will remain catheter-based until a new access can be created.  Nephrology is following.  2) patient with acute CVA to the right hemisphere.  Unsure if this is also contributing factor in the inability for the patient to move her hand.  The patient notes that she was able to move her hand after Dodson.  Okay from  a vascular standpoint to place the patient on anticoagulation.  3) blisters to left upper extremity: Unsure of the etiology as the patient's hand was soft during my examination on Friday.  This may have occurred after ligation and have a component of reperfusion which led to these blisters.  Will consult the wound care nurses for topical recommendations for these blisters.  4) from a vascular Dodson standpoint, there are no further recommendations.  We will see her back in our office for new creation work-up.  When she is medical stable she can be discharged home with at least PT/OT services for the loss of motor function to her left hand.  Discussed with Dr. Ellis Parents Aleeyah Bensen PA-C 02/28/2021 1:29 PM

## 2021-02-28 NOTE — Progress Notes (Signed)
PROGRESS NOTE    Brianna Dodson  POE:[id number] DOB: [birth year] DOA: 02/25/2021 PCP: [name], MD   Brief Narrative:  Brianna Dodson is a 87 y.o. female with medical history significant of ESRD-HD (MWF), hypertension, hyperlipidemia, diabetes mellitus,'s embolic stroke, atrial fibrillation on Eliquis, depression, OSA, HCV, anemia, Alzheimer's disease, who presents with left arm pain and weakness. Pt had left arm AV graft placement procedure 8/25 with vascular surgery.  After she went home, she developed severe pain in the left arm, which is constant, sharp, throbbing-like, nonradiating.  Patient also reports weakness in left forearm, particularly in the left wrist and hand. She has been off Eliquis from 8/22-8/25 for surgical procedure.  She has not restarted Eliquis yet. In ED MRI showed possible new stroke. Pt is placed in med-surg bed for obs. Dr. [name] of VVS, Dr. Juleen China of renal, Dr. Rory Percy of neuro are consulted.  Assessment & Plan:   Acute right parietal lobe infarct, POA - Neurology and Vascular following - appreciate insight and recs - Continue neuro checks, defer repeat imaging per consults - OK to continue Eliquis per vascular - A1C 6.8 - Lipid panel WNL - PT/OT consult  Left arm pain after AV graft placement:  - Vascular surgery following, appreciate insight and recommendations  - Concern for steal syndrome given imaging -this post ligation of the AV graft  Essential hypertension - IV hydralazine as needed - Amlodipine, Coreg, hydralazine   ESRD-HD: - Nephrology consulted for dialysis   HLD (hyperlipidemia) - Lipitor and Zetia   Type II diabetes mellitus with renal manifestations ([place]):  - Recent A1c 6.8, borderline controlled.  Patient is taking Tradjenta - Sliding scale insulin   Anemia in ESRD (end-stage renal disease) ([place]):  - Hemoglobin stable - Continue iron supplement   Atrial fibrillation (HCC) - Coreg - Continue Eliquis; per vascular - okay to  resume     Thrombocytopenia ([place]):  - This is chronic issue.  Stable in 110s - No bleeding tendency. - Follow-up with CBC  DVT prophylaxis: SCDs only, hold anticoagulation given above Code Status: Partial, DO NOT INTUBATE Family Communication: None present  Status is: Inpatient  Dispo: The patient is from: Home              Anticipated d/c is to: Home              Anticipated d/c date is: 24-48 hours              Patient currently not medically stable for discharge  Consultants:  Nephrology, neuro, vascular surgery  Procedures:  Tentative plan for graft ligation  Antimicrobials:  None indicated  Subjective: No acute issues or events overnight denies nausea vomiting diarrhea constipation headache fevers chills or chest pain  Objective: Vitals:   02/27/21 1641 02/27/21 1950 02/28/21 0354 02/28/21 0734  BP: (!) 149/75 96/60 (!) 133/57 (!) 112/55  Pulse: 76 76 72 70  Resp: 16  16 18   Temp: 97.8 F (36.6 C) 98.1 F (36.7 C) 97.6 F (36.4 C) 98.9 F (37.2 C)  TempSrc:  Oral Oral   SpO2: 100% 95% 97% 94%  Weight:      Height:        Intake/Output Summary (Last 24 hours) at 02/28/2021 0753 Last data filed at 02/27/2021 1905 Gross per 24 hour  Intake 240 ml  Output --  Net 240 ml    Filed Weights   02/25/21 0816 02/25/21 1756  Weight: 65.3 kg 68.4 kg    Examination:  General:  Pleasantly resting in bed, No acute distress. HEENT:  Normocephalic atraumatic.  Sclerae nonicteric, noninjected.  Extraocular movements intact bilaterally. Neck:  Without mass or deformity.  Trachea is midline. Lungs:  Clear to auscultate bilaterally without rhonchi, wheeze, or rales. Heart:  Regular rate and rhythm.  Without murmurs, rubs, or gallops. Abdomen:  Soft, nontender, nondistended.  Without guarding or rebound. Extremities: Left arm flaccid distal to the elbow, sensation intact and equal bilaterally otherwise; notable bullae lateral left forearm Vascular:  Dorsalis pedis and  posterior tibial pulses palpable bilaterally. Skin:  Warm and dry, no erythema, no ulcerations.   Data Reviewed: I have personally reviewed following labs and imaging studies  CBC: Recent Labs  Lab 02/24/21 1141 02/24/21 1153 02/25/21 0841 02/27/21 0530 02/28/21 0502  WBC 2.8*  --  6.9 5.0 5.4  NEUTROABS  --   --  5.6  --   --   HGB 10.0* 9.2* 10.5* 11.7* 9.7*  HCT 28.2* 27.0* 29.7* 31.5* 27.6*  MCV 88.4  --  88.1 87.7 90.8  PLT 111*  --  115* 119* 112*    Basic Metabolic Panel: Recent Labs  Lab 02/24/21 1153 02/25/21 0841 02/27/21 0530 02/28/21 0617  NA 141 139 135 133*  K 3.6 3.6 3.4* 3.9  CL 97* 97* 95* 96*  CO2  --  32 30 28  GLUCOSE 156* 225* 149* 138*  BUN 20 27* 25* 38*  CREATININE 3.00* 3.40* 3.88* 5.67*  CALCIUM  --  9.2 8.5* 8.0*    GFR: Estimated Creatinine Clearance: 7.1 mL/min (A) (by C-G formula based on SCr of 5.67 mg/dL (H)). Liver Function Tests: No results for input(s): AST, ALT, ALKPHOS, BILITOT, PROT, ALBUMIN in the last 168 hours. No results for input(s): LIPASE, AMYLASE in the last 168 hours. No results for input(s): AMMONIA in the last 168 hours. Coagulation Profile: No results for input(s): INR, PROTIME in the last 168 hours. Cardiac Enzymes: No results for input(s): CKTOTAL, CKMB, CKMBINDEX, TROPONINI in the last 168 hours. BNP (last 3 results) No results for input(s): PROBNP in the last 8760 hours. HbA1C: Recent Labs    02/26/21 0502  HGBA1C 6.1*    CBG: Recent Labs  Lab 02/26/21 2317 02/27/21 0857 02/27/21 1217 02/27/21 1654 02/27/21 1945  GLUCAP 215* 135* 209* 98 151*    Lipid Profile: Recent Labs    02/26/21 0502  CHOL 110  HDL 47  LDLCALC 49  TRIG 68  CHOLHDL 2.3    Thyroid Function Tests: No results for input(s): TSH, T4TOTAL, FREET4, T3FREE, THYROIDAB in the last 72 hours. Anemia Panel: No results for input(s): VITAMINB12, FOLATE, FERRITIN, TIBC, IRON, RETICCTPCT in the last 72 hours. Sepsis Labs: No  results for input(s): PROCALCITON, LATICACIDVEN in the last 168 hours.  Recent Results (from the past 240 hour(s))  Resp Panel by RT-PCR (Flu A&B, Covid) Nasopharyngeal Swab     Status: None   Collection Time: 02/25/21 12:01 PM   Specimen: Nasopharyngeal Swab; Nasopharyngeal(NP) swabs in vial transport medium  Result Value Ref Range Status   SARS Coronavirus 2 by RT PCR NEGATIVE NEGATIVE Final    Comment: (NOTE) SARS-CoV-2 target nucleic acids are NOT DETECTED.  The SARS-CoV-2 RNA is generally detectable in upper respiratory specimens during the acute phase of infection. The lowest concentration of SARS-CoV-2 viral copies this assay can detect is 138 copies/mL. A negative result does not preclude SARS-Cov-2 infection and should not be used as the sole basis for treatment or other patient management decisions.  A negative result may occur with  improper specimen collection/handling, submission of specimen other than nasopharyngeal swab, presence of viral mutation(s) within the areas targeted by this assay, and inadequate number of viral copies(<138 copies/mL). A negative result must be combined with clinical observations, patient history, and epidemiological information. The expected result is Negative.  Fact Sheet for Patients:  EntrepreneurPulse.com.au  Fact Sheet for Healthcare Providers:  IncredibleEmployment.be  This test is no t yet approved or cleared by the [place] FDA and  has been authorized for detection and/or diagnosis of SARS-CoV-2 by FDA under an Emergency Use Authorization (EUA). This EUA will remain  in effect (meaning this test can be used) for the duration of the COVID-19 declaration under Section 564(b)(1) of the Act, 21 U.S.C.section 360bbb-3(b)(1), unless the authorization is terminated  or revoked sooner.       Influenza A by PCR NEGATIVE NEGATIVE Final   Influenza B by PCR NEGATIVE NEGATIVE Final    Comment:  (NOTE) The Xpert Xpress SARS-CoV-2/FLU/RSV plus assay is intended as an aid in the diagnosis of influenza from Nasopharyngeal swab specimens and should not be used as a sole basis for treatment. Nasal washings and aspirates are unacceptable for Xpert Xpress SARS-CoV-2/FLU/RSV testing.  Fact Sheet for Patients: EntrepreneurPulse.com.au  Fact Sheet for Healthcare Providers: IncredibleEmployment.be  This test is not yet approved or cleared by the [place] FDA and has been authorized for detection and/or diagnosis of SARS-CoV-2 by FDA under an Emergency Use Authorization (EUA). This EUA will remain in effect (meaning this test can be used) for the duration of the COVID-19 declaration under Section 564(b)(1) of the Act, 21 U.S.C. section 360bbb-3(b)(1), unless the authorization is terminated or revoked.  Performed at [facility], [address], [place], [place] [address]    Radiology Studies: US Venous Img Upper Uni Left (DVT)  Result Date: 02/27/2021 CLINICAL DATA:  Left upper extremity edema. Patient is status post ligation of arteriovenous graft on 02/26/2021. EXAM: LEFT UPPER EXTREMITY VENOUS DOPPLER ULTRASOUND TECHNIQUE: Gray-scale sonography with graded compression, as well as color Doppler and duplex ultrasound were performed to evaluate the upper extremity deep venous system from the level of the subclavian vein and including the jugular, axillary, basilic, radial, ulnar and upper cephalic vein. Spectral Doppler was utilized to evaluate flow at rest and with distal augmentation maneuvers. COMPARISON:  None. FINDINGS: Contralateral Subclavian Vein: Respiratory phasicity is normal and symmetric with the symptomatic side. No evidence of thrombus. Normal compressibility. Internal Jugular Vein: No evidence of thrombus. Normal compressibility, respiratory phasicity and response to augmentation. Subclavian Vein: No evidence of thrombus.  Normal compressibility, respiratory phasicity and response to augmentation. Axillary Vein: No evidence of thrombus. Normal compressibility, respiratory phasicity and response to augmentation. Cephalic Vein: No evidence of thrombus. Normal compressibility, respiratory phasicity and response to augmentation. Basilic Vein: No evidence of thrombus. Normal compressibility, respiratory phasicity and response to augmentation. Brachial Veins: No evidence of thrombus. Normal compressibility, respiratory phasicity and response to augmentation. Radial Veins: No evidence of thrombus. Normal compressibility, respiratory phasicity and response to augmentation. Ulnar Veins: No evidence of thrombus. Normal compressibility, respiratory phasicity and response to augmentation. Venous Reflux:  None visualized. Other Findings: Occluded arteriovenous graft visualized. Irregular amorphous complex fluid partially encircles the graft. There are areas of more solid or cystic component. No evidence of internal vascularity. No evidence of peripheral hyperemia. IMPRESSION: No evidence of DVT within the left upper extremity. Occluded left upper extremity arteriovenous graft with complex fluid partially encircling the graft  in the proximal upper arm. Differential considerations include postoperative hematoma versus abscess (in the appropriate clinical setting). Given the sonographic appearance, hematoma is favored. Electronically Signed   By: Jacqulynn Cadet M.D.   On: 02/27/2021 11:05    Scheduled Meds:  amLODipine  5 mg Oral Daily   apixaban  2.5 mg Oral BID   atorvastatin  80 mg Oral Daily   carvedilol  6.25 mg Oral BID WC   Chlorhexidine Gluconate Cloth  6 each Topical Q0600   ezetimibe  10 mg Oral Daily   ferrous sulfate  325 mg Oral Q1500   gabapentin  100 mg Oral QHS   hydrALAZINE  50 mg Oral BID   insulin aspart  0-5 Units Subcutaneous QHS   insulin aspart  0-9 Units Subcutaneous TID WC   mirtazapine  7.5 mg Oral QHS    Continuous Infusions:  sodium chloride     sodium chloride      LOS: 2 days   Time spent: 47min  Makyle Eslick C Rithwik Schmieg, DO [facility]  If 7PM-7AM, please contact night-coverage [email or web address]  02/28/2021, 7:53 AM

## 2021-02-28 NOTE — Progress Notes (Signed)
[facility]  ROUNDING NOTE   Subjective:   Brianna Dodson is a 87 y.o. female with past medical history of atrial fib, anemia, hypertension, hyperlipidemia, diabetes, stroke, and ESRD on dialysis. Patient presents to ED with complaints of left arm pain since previous night.   Patient is known to our practice and receives treatment at [facility], supervised by Dr Juleen China.   Patient seen during dialysis   HEMODIALYSIS FLOWSHEET:  Blood Flow Rate (mL/min): 400 mL/min Arterial Pressure (mmHg): -150 mmHg Venous Pressure (mmHg): 130 mmHg Transmembrane Pressure (mmHg): 60 mmHg Ultrafiltration Rate (mL/min): 170 mL/min Dialysate Flow Rate (mL/min): 500 ml/min Conductivity: Machine : 13.6 Conductivity: Machine : 13.6 Dialysis Fluid Bolus: Normal Saline Bolus Amount (mL): 300 mL  Complains of left arm pain and swelling Recent concerns from failed left arm AVG, ischemic steal syndrome, AVG removed on 02/26/21   Objective:  Vital signs in last 24 hours:  Temp:  [97.6 F (36.4 C)-98.9 F (37.2 C)] 97.8 F (36.6 C) (08/29 1304) Pulse Rate:  [70-91] 91 (08/29 1304) Resp:  [8-24] 18 (08/29 1304) BP: (96-149)/(55-76) 125/66 (08/29 1304) SpO2:  [94 %-100 %] 100 % (08/29 1304)  Weight change:  Filed Weights   02/25/21 0816 02/25/21 1756  Weight: 65.3 kg 68.4 kg    Intake/Output: I/O last 3 completed shifts: In: 240 [P.O.:240] Out: -    Intake/Output this shift:  No intake/output data recorded.  Physical Exam: General: NAD, laying on bed  Head: Normocephalic, atraumatic. Moist oral mucosal membranes  Eyes: Anicteric  Lungs:  Clear to auscultation, normal effort  Heart: Regular rate and rhythm  Abdomen:  Soft, nontender  Extremities:  no peripheral edema.   Neurologic: Nonfocal, moving all four extremities  Skin: No lesions  Access: Rt permcath, LUE AVG removed on 77/41/28    Basic Metabolic Panel: Recent Labs  Lab 02/24/21 1153 02/25/21 0841  02/27/21 0530 02/28/21 0617  NA 141 139 135 133*  K 3.6 3.6 3.4* 3.9  CL 97* 97* 95* 96*  CO2  --  32 30 28  GLUCOSE 156* 225* 149* 138*  BUN 20 27* 25* 38*  CREATININE 3.00* 3.40* 3.88* 5.67*  CALCIUM  --  9.2 8.5* 8.0*     Liver Function Tests: No results for input(s): AST, ALT, ALKPHOS, BILITOT, PROT, ALBUMIN in the last 168 hours. No results for input(s): LIPASE, AMYLASE in the last 168 hours. No results for input(s): AMMONIA in the last 168 hours.  CBC: Recent Labs  Lab 02/24/21 1141 02/24/21 1153 02/25/21 0841 02/27/21 0530 02/28/21 0502  WBC 2.8*  --  6.9 5.0 5.4  NEUTROABS  --   --  5.6  --   --   HGB 10.0* 9.2* 10.5* 11.7* 9.7*  HCT 28.2* 27.0* 29.7* 31.5* 27.6*  MCV 88.4  --  88.1 87.7 90.8  PLT 111*  --  115* 119* 112*     Cardiac Enzymes: No results for input(s): CKTOTAL, CKMB, CKMBINDEX, TROPONINI in the last 168 hours.  BNP: Invalid input(s): POCBNP  CBG: Recent Labs  Lab 02/27/21 1217 02/27/21 1654 02/27/21 1945 02/28/21 0804 02/28/21 1253  GLUCAP 209* 98 151* 139* 133*     Microbiology: Results for orders placed or performed during the hospital encounter of 02/25/21  Resp Panel by RT-PCR (Flu A&B, Covid) Nasopharyngeal Swab     Status: None   Collection Time: 02/25/21 12:01 PM   Specimen: Nasopharyngeal Swab; Nasopharyngeal(NP) swabs in vial transport medium  Result Value Ref Range Status  SARS Coronavirus 2 by RT PCR NEGATIVE NEGATIVE Final    Comment: (NOTE) SARS-CoV-2 target nucleic acids are NOT DETECTED.  The SARS-CoV-2 RNA is generally detectable in upper respiratory specimens during the acute phase of infection. The lowest concentration of SARS-CoV-2 viral copies this assay can detect is 138 copies/mL. A negative result does not preclude SARS-Cov-2 infection and should not be used as the sole basis for treatment or other patient management decisions. A negative result may occur with  improper specimen collection/handling,  submission of specimen other than nasopharyngeal swab, presence of viral mutation(s) within the areas targeted by this assay, and inadequate number of viral copies(<138 copies/mL). A negative result must be combined with clinical observations, patient history, and epidemiological information. The expected result is Negative.  Fact Sheet for Patients:  EntrepreneurPulse.com.au  Fact Sheet for Healthcare Providers:  IncredibleEmployment.be  This test is no t yet approved or cleared by the [place] FDA and  has been authorized for detection and/or diagnosis of SARS-CoV-2 by FDA under an Emergency Use Authorization (EUA). This EUA will remain  in effect (meaning this test can be used) for the duration of the COVID-19 declaration under Section 564(b)(1) of the Act, 21 U.S.C.section 360bbb-3(b)(1), unless the authorization is terminated  or revoked sooner.       Influenza A by PCR NEGATIVE NEGATIVE Final   Influenza B by PCR NEGATIVE NEGATIVE Final    Comment: (NOTE) The Xpert Xpress SARS-CoV-2/FLU/RSV plus assay is intended as an aid in the diagnosis of influenza from Nasopharyngeal swab specimens and should not be used as a sole basis for treatment. Nasal washings and aspirates are unacceptable for Xpert Xpress SARS-CoV-2/FLU/RSV testing.  Fact Sheet for Patients: EntrepreneurPulse.com.au  Fact Sheet for Healthcare Providers: IncredibleEmployment.be  This test is not yet approved or cleared by the [place] FDA and has been authorized for detection and/or diagnosis of SARS-CoV-2 by FDA under an Emergency Use Authorization (EUA). This EUA will remain in effect (meaning this test can be used) for the duration of the COVID-19 declaration under Section 564(b)(1) of the Act, 21 U.S.C. section 360bbb-3(b)(1), unless the authorization is terminated or revoked.  Performed at [facility], [address] [place], [place] [address]     Coagulation Studies: No results for input(s): LABPROT, INR in the last 72 hours.  Urinalysis: No results for input(s): COLORURINE, LABSPEC, PHURINE, GLUCOSEU, HGBUR, BILIRUBINUR, KETONESUR, PROTEINUR, UROBILINOGEN, NITRITE, LEUKOCYTESUR in the last 72 hours.  Invalid input(s): APPERANCEUR    Imaging: US Venous Img Upper Uni Left (DVT)  Result Date: 02/27/2021 CLINICAL DATA:  Left upper extremity edema. Patient is status post ligation of arteriovenous graft on 02/26/2021. EXAM: LEFT UPPER EXTREMITY VENOUS DOPPLER ULTRASOUND TECHNIQUE: Gray-scale sonography with graded compression, as well as color Doppler and duplex ultrasound were performed to evaluate the upper extremity deep venous system from the level of the subclavian vein and including the jugular, axillary, basilic, radial, ulnar and upper cephalic vein. Spectral Doppler was utilized to evaluate flow at rest and with distal augmentation maneuvers. COMPARISON:  None. FINDINGS: Contralateral Subclavian Vein: Respiratory phasicity is normal and symmetric with the symptomatic side. No evidence of thrombus. Normal compressibility. Internal Jugular Vein: No evidence of thrombus. Normal compressibility, respiratory phasicity and response to augmentation. Subclavian Vein: No evidence of thrombus. Normal compressibility, respiratory phasicity and response to augmentation. Axillary Vein: No evidence of thrombus. Normal compressibility, respiratory phasicity and response to augmentation. Cephalic Vein: No evidence of thrombus. Normal compressibility, respiratory phasicity and response to  augmentation. Basilic Vein: No evidence of thrombus. Normal compressibility, respiratory phasicity and response to augmentation. Brachial Veins: No evidence of thrombus. Normal compressibility, respiratory phasicity and response to augmentation. Radial Veins: No evidence of thrombus. Normal compressibility, respiratory  phasicity and response to augmentation. Ulnar Veins: No evidence of thrombus. Normal compressibility, respiratory phasicity and response to augmentation. Venous Reflux:  None visualized. Other Findings: Occluded arteriovenous graft visualized. Irregular amorphous complex fluid partially encircles the graft. There are areas of more solid or cystic component. No evidence of internal vascularity. No evidence of peripheral hyperemia. IMPRESSION: No evidence of DVT within the left upper extremity. Occluded left upper extremity arteriovenous graft with complex fluid partially encircling the graft in the proximal upper arm. Differential considerations include postoperative hematoma versus abscess (in the appropriate clinical setting). Given the sonographic appearance, hematoma is favored. Electronically Signed   By: Jacqulynn Cadet M.D.   On: 02/27/2021 11:05     Medications:    sodium chloride     sodium chloride      amLODipine  5 mg Oral Daily   apixaban  2.5 mg Oral BID   atorvastatin  80 mg Oral Daily   carvedilol  6.25 mg Oral BID WC   Chlorhexidine Gluconate Cloth  6 each Topical Q0600   ezetimibe  10 mg Oral Daily   ferrous sulfate  325 mg Oral Q1500   gabapentin  100 mg Oral QHS   heparin sodium (porcine)       hydrALAZINE  50 mg Oral BID   insulin aspart  0-5 Units Subcutaneous QHS   insulin aspart  0-9 Units Subcutaneous TID WC   mirtazapine  7.5 mg Oral QHS   sodium chloride, sodium chloride, acetaminophen, alteplase, heparin, lidocaine (PF), lidocaine-prilocaine, morphine injection, pentafluoroprop-tetrafluoroeth, senna-docusate, simethicone  Assessment/ Plan:  Ms. Juanetta Negash is a 87 y.o.  female with past medical history of atrial fib, anemia, hypertension, hyperlipidemia, diabetes, stroke, and ESRD on dialysis. Patient presents to ED with complaints of left arm pain since previous night.   [facility] [address]/MWF/Rt Permcath   1. End stage renal disease on  dialysis Will maintain outpatient schedule if possible Received dialysis today, tolerated well. No UF Next treatment scheduled for Wednesday  2. Anemia of chronic kidney disease Lab Results  Component Value Date   HGB 9.7 (L) 02/28/2021  Hgb within acceptable range   3. Secondary Hyperparathyroidism:  Lab Results  Component Value Date   PTH 83 (H) 01/06/2016   CALCIUM 8.0 (L) 02/28/2021   CAION 1.04 (L) 02/24/2021   PHOS 3.7 08/21/2019     Calcium not at goal. Will check Phosphorus.    4. Hypertension  Home regimen includes amlodipine, carvedilol, and hydralazine. Current on these medications   LOS: 2 [place] 8/29/20221:10 PM

## 2021-02-28 NOTE — Progress Notes (Signed)
Hemodialysis patient known at [facility] MWF 11:50am. Patient stated no dialysis concerns. Please contact me with any dialysis placement concerns.  Elvera Bicker Dialysis Coordinator [phone]

## 2021-02-28 NOTE — Discharge Instructions (Signed)
1) you may shower as of tomorrow.  Please keep your incisions clean and dry.  Gently clean your incision with soap and water.  Gently pat dry. 2) please try to elevate your left upper extremity is much as possible to help with any swelling.

## 2021-03-01 LAB — BASIC METABOLIC PANEL
Anion gap: 9 (ref 5–15)
BUN: 26 mg/dL — ABNORMAL HIGH (ref 8–23)
CO2: 29 mmol/L (ref 22–32)
Calcium: 7.8 mg/dL — ABNORMAL LOW (ref 8.9–10.3)
Chloride: 97 mmol/L — ABNORMAL LOW (ref 98–111)
Creatinine, Ser: 4.28 mg/dL — ABNORMAL HIGH (ref 0.44–1.00)
GFR, Estimated: 10 mL/min — ABNORMAL LOW (ref >60)
Glucose, Bld: 123 mg/dL — ABNORMAL HIGH (ref 70–99)
Potassium: 3.7 mmol/L (ref 3.5–5.1)
Sodium: 135 mmol/L (ref 135–145)

## 2021-03-01 LAB — CBC
HCT: 23.9 % — ABNORMAL LOW (ref 36.0–46.0)
Hemoglobin: 8.6 g/dL — ABNORMAL LOW (ref 12.0–15.0)
MCH: 31.6 pg (ref 26.0–34.0)
MCHC: 36 g/dL (ref 30.0–36.0)
MCV: 87.9 fL (ref 80.0–100.0)
Platelets: 119 10*3/uL — ABNORMAL LOW (ref 150–400)
RBC: 2.72 MIL/uL — ABNORMAL LOW (ref 3.87–5.11)
RDW: 14.4 % (ref 11.5–15.5)
WBC: 5.3 10*3/uL (ref 4.0–10.5)
nRBC: 0 % (ref 0.0–0.2)

## 2021-03-01 LAB — GLUCOSE, CAPILLARY
Glucose-Capillary: 107 mg/dL — ABNORMAL HIGH (ref 70–99)
Glucose-Capillary: 168 mg/dL — ABNORMAL HIGH (ref 70–99)
Glucose-Capillary: 128 mg/dL — ABNORMAL HIGH (ref 70–99)

## 2021-03-01 MED ORDER — RENA-VITE PO TABS
1.0000 | ORAL_TABLET | Freq: Every day | ORAL | Status: DC
  Administered 2021-03-01 – 2021-03-02 (×2): 1 via ORAL
  Filled 2021-03-01 (×3): qty 1

## 2021-03-01 MED ORDER — NEPRO/CARBSTEADY PO LIQD
237.0000 mL | Freq: Two times a day (BID) | ORAL | Status: DC
  Administered 2021-03-01 – 2021-03-03 (×4): 237 mL via ORAL

## 2021-03-01 MED ORDER — ASCORBIC ACID 500 MG PO TABS
500.0000 mg | ORAL_TABLET | Freq: Every day | ORAL | Status: DC
  Administered 2021-03-01 – 2021-03-03 (×3): 500 mg via ORAL
  Filled 2021-03-01 (×3): qty 1

## 2021-03-01 MED ORDER — CALCIUM CARBONATE ANTACID 500 MG PO CHEW
1.0000 | CHEWABLE_TABLET | Freq: Two times a day (BID) | ORAL | Status: DC
  Administered 2021-03-01 – 2021-03-03 (×5): 200 mg via ORAL
  Filled 2021-03-01 (×5): qty 1

## 2021-03-01 NOTE — Progress Notes (Signed)
Initial Nutrition Assessment  DOCUMENTATION CODES:  Not applicable  INTERVENTION:  Liberalize diet to carb modified for normal mineral levels and poor intake Nepro Shake po BID, each supplement provides 425 kcal and 19 grams protein Renavite and 500mg  of vitamin C daily for increased micronutrient needs with HD  NUTRITION DIAGNOSIS:  Increased nutrient needs related to chronic illness (ESRD on HD) as evidenced by estimated needs.  GOAL:  Patient will meet greater than or equal to 90% of their needs  MONITOR:  PO intake, Supplement acceptance, Labs  REASON FOR ASSESSMENT:  Consult Assessment of nutrition requirement/status  ASSESSMENT:  87 y.o. female with A. fib, hx stroke, Alzheimer's, ESRD on HD (MWF), HTN, HLD, and DM type 2, presented to ED with concern for throbbing left arm with loss of sensation. Pt underwent left AVG placement the day PTA.  Vascular surgery and neurology consulted to determine cause of arm weakness. Some concerns for steal syndrome. Nephrology consulted for HD during admission  Pt taken back to OR for ligation of the recently placed AV graft 8/27. Pain and perfusion improved after ligation but pt continues to have loss of motor function.  Pt to be dc to SNF when medically stable.   Poor intake of meals noted this admission. Discussed with RN who reports pt has complained about the taste of her food. Reviewed renal labs and K and Na have been normal/low throughout admission. Discussed with Nephrology, ok to liberalize diet to allow for more food choices. Will also add vitamins due to HD and increased micronutrient needs.  Average Meal Intake: 8/28: 20% intake x 1 recorded meal  Nutritionally Relevant Medications: Scheduled Meds:  atorvastatin  80 mg Oral Daily   ferrous sulfate  325 mg Oral Q1500   insulin aspart  0-5 Units Subcutaneous QHS   insulin aspart  0-9 Units Subcutaneous TID WC   mirtazapine  7.5 mg Oral QHS   PRN Meds: liver oil-zinc  oxide, senna-docusate, simethicone  Labs Reviewed: BUN 26, creatinine 4.28 SBG ranges from 107-183 mg/dL over the last 24 hours HgbA1c 6.1% (8/27)  NUTRITION - FOCUSED PHYSICAL EXAM: Defer to in-person assessment  Diet Order:   Diet Order             Diet renal with fluid restriction Fluid restriction: 1200 mL Fluid; Room service appropriate? Yes; Fluid consistency: Thin  Diet effective now                   EDUCATION NEEDS:  No education needs have been identified at this time  Skin:  Skin Assessment: Skin Integrity Issues: Skin Integrity Issues:: Incisions Incisions: left arm  Last BM:  8/28 - type 4  Height:  Ht Readings from Last 1 Encounters:  02/25/21 5\' 5"  (1.651 m)    Weight:  Wt Readings from Last 1 Encounters:  02/25/21 68.4 kg    Ideal Body Weight:  56.8 kg  BMI:  Body mass index is 25.09 kg/m.  Estimated Nutritional Needs:  Kcal:  1700-1900 kcal/d Protein:  85-95 g/d Fluid:  1L+UOP  Ranell Patrick, RD, LDN Clinical Dietitian Pager on [place]

## 2021-03-01 NOTE — Progress Notes (Signed)
[facility]  Daily Progress Note   Subjective  -   No events overnight. Has a little less swelling although arm was not elevated much as patient not tolerating well. Remains paretic  Objective Vitals:   02/28/21 1605 02/28/21 2112 03/01/21 0455 03/01/21 0837  BP: 122/67 (!) 109/51 126/68 (!) 118/50  Pulse: 79 70 70 66  Resp: 16 16 16 15   Temp: 97.8 F (36.6 C) 98.2 F (36.8 C) 98.4 F (36.9 C) 98.2 F (36.8 C)  TempSrc: Oral Oral Oral Oral  SpO2: 98% 97% 93% 98%  Weight:      Height:        Intake/Output Summary (Last 24 hours) at 03/01/2021 0839 Last data filed at 02/28/2021 1900 Gross per 24 hour  Intake 0 ml  Output 0 ml  Net 0 ml    PULM  CTAB CV  RRR VASC  Incisions are intact. Arm swelling 1-2+. Remains paretic on the left forearm  Laboratory CBC    Component Value Date/Time   WBC 5.3 03/01/2021 0544   HGB 8.6 (L) 03/01/2021 0544   HGB 12.1 11/09/2013 1540   HCT 23.9 (L) 03/01/2021 0544   HCT 36.3 11/09/2013 1540   PLT 119 (L) 03/01/2021 0544   PLT 183 11/09/2013 1540    BMET    Component Value Date/Time   NA 135 03/01/2021 0544   NA 142 11/10/2013 0522   K 3.7 03/01/2021 0544   K 4.6 11/10/2013 0522   CL 97 (L) 03/01/2021 0544   CL 110 (H) 11/10/2013 0522   CO2 29 03/01/2021 0544   CO2 27 11/10/2013 0522   GLUCOSE 123 (H) 03/01/2021 0544   GLUCOSE 70 11/10/2013 0522   BUN 26 (H) 03/01/2021 0544   BUN 22 (H) 11/10/2013 0522   CREATININE 4.28 (H) 03/01/2021 0544   CREATININE 1.12 11/10/2013 0522   CALCIUM 7.8 (L) 03/01/2021 0544   CALCIUM 8.4 (L) 11/10/2013 0522   GFRNONAA 10 (L) 03/01/2021 0544   GFRNONAA 47 (L) 11/10/2013 0522   GFRAA 27 (L) 08/22/2019 0449   GFRAA 54 (L) 11/10/2013 0522     Assessment/Planning: POD #3 s/p ligation of left arm AVG  Left arm swelling slightly better, blisters still present and covered with Xeroform Remains densely paretic in the forearm. From the acute stroke?, result of the  block.  Atypical for IMN but graft has been ligated and no further therapy can be done for this PT/OT Will need an arm splint to avoid contracture once the swelling improves    Leotis Pain  03/01/2021, 8:39 AM

## 2021-03-01 NOTE — Progress Notes (Signed)
[facility]  ROUNDING NOTE   Subjective:   Brianna Dodson is a 87 y.o. female with past medical history of atrial fib, anemia, hypertension, hyperlipidemia, diabetes, stroke, and ESRD on dialysis. Patient presents to ED with complaints of left arm pain since previous night.   Patient is known to our practice and receives treatment at [facility], supervised by Dr Juleen China.   Patient resting in bed Eating breakfast, poor appetite Complaints of pain and swelling in left arm Per nursing, left arm was elevated overnight   Objective:  Vital signs in last 24 hours:  Temp:  [97.8 F (36.6 C)-98.4 F (36.9 C)] 98.2 F (36.8 C) (08/30 0837) Pulse Rate:  [66-91] 66 (08/30 0837) Resp:  [14-18] 15 (08/30 0837) BP: (104-131)/(50-70) 118/50 (08/30 0837) SpO2:  [93 %-100 %] 98 % (08/30 0837)  Weight change:  Filed Weights   02/25/21 0816 02/25/21 1756  Weight: 65.3 kg 68.4 kg    Intake/Output: I/O last 3 completed shifts: In: 240 [P.O.:240] Out: 0    Intake/Output this shift:  No intake/output data recorded.  Physical Exam: General: NAD, laying on bed  Head: Normocephalic, atraumatic. Moist oral mucosal membranes  Eyes: Anicteric  Lungs:  Clear to auscultation, normal effort  Heart: Regular rate and rhythm  Abdomen:  Soft, nontender  Extremities:  no peripheral edema.   Neurologic: Nonfocal, moving all four extremities  Skin: No lesions  Access: Rt permcath, LUE AVG removed on 77/41/28    Basic Metabolic Panel: Recent Labs  Lab 02/24/21 1153 02/25/21 0841 02/25/21 0841 02/27/21 0530 02/28/21 0617 03/01/21 0544  NA 141 139  --  135 133* 135  K 3.6 3.6  --  3.4* 3.9 3.7  CL 97* 97*  --  95* 96* 97*  CO2  --  32  --  30 28 29   GLUCOSE 156* 225*  --  149* 138* 123*  BUN 20 27*  --  25* 38* 26*  CREATININE 3.00* 3.40*  --  3.88* 5.67* 4.28*  CALCIUM  --  9.2   < > 8.5* 8.0* 7.8*  PHOS  --   --   --   --  3.6  --    < > = values in this interval  not displayed.     Liver Function Tests: No results for input(s): AST, ALT, ALKPHOS, BILITOT, PROT, ALBUMIN in the last 168 hours. No results for input(s): LIPASE, AMYLASE in the last 168 hours. No results for input(s): AMMONIA in the last 168 hours.  CBC: Recent Labs  Lab 02/24/21 1141 02/24/21 1153 02/25/21 0841 02/27/21 0530 02/28/21 0502 03/01/21 0544  WBC 2.8*  --  6.9 5.0 5.4 5.3  NEUTROABS  --   --  5.6  --   --   --   HGB 10.0* 9.2* 10.5* 11.7* 9.7* 8.6*  HCT 28.2* 27.0* 29.7* 31.5* 27.6* 23.9*  MCV 88.4  --  88.1 87.7 90.8 87.9  PLT 111*  --  115* 119* 112* 119*     Cardiac Enzymes: No results for input(s): CKTOTAL, CKMB, CKMBINDEX, TROPONINI in the last 168 hours.  BNP: Invalid input(s): POCBNP  CBG: Recent Labs  Lab 02/28/21 0804 02/28/21 1253 02/28/21 1549 02/28/21 2114 03/01/21 0838  GLUCAP 139* 133* 183* 148* 107*     Microbiology: Results for orders placed or performed during the hospital encounter of 02/25/21  Resp Panel by RT-PCR (Flu A&B, Covid) Nasopharyngeal Swab     Status: None   Collection Time: 02/25/21 12:01  PM   Specimen: Nasopharyngeal Swab; Nasopharyngeal(NP) swabs in vial transport medium  Result Value Ref Range Status   SARS Coronavirus 2 by RT PCR NEGATIVE NEGATIVE Final    Comment: (NOTE) SARS-CoV-2 target nucleic acids are NOT DETECTED.  The SARS-CoV-2 RNA is generally detectable in upper respiratory specimens during the acute phase of infection. The lowest concentration of SARS-CoV-2 viral copies this assay can detect is 138 copies/mL. A negative result does not preclude SARS-Cov-2 infection and should not be used as the sole basis for treatment or other patient management decisions. A negative result may occur with  improper specimen collection/handling, submission of specimen other than nasopharyngeal swab, presence of viral mutation(s) within the areas targeted by this assay, and inadequate number of  viral copies(<138 copies/mL). A negative result must be combined with clinical observations, patient history, and epidemiological information. The expected result is Negative.  Fact Sheet for Patients:  EntrepreneurPulse.com.au  Fact Sheet for Healthcare Providers:  IncredibleEmployment.be  This test is no t yet approved or cleared by the [place] FDA and  has been authorized for detection and/or diagnosis of SARS-CoV-2 by FDA under an Emergency Use Authorization (EUA). This EUA will remain  in effect (meaning this test can be used) for the duration of the COVID-19 declaration under Section 564(b)(1) of the Act, 21 U.S.C.section 360bbb-3(b)(1), unless the authorization is terminated  or revoked sooner.       Influenza A by PCR NEGATIVE NEGATIVE Final   Influenza B by PCR NEGATIVE NEGATIVE Final    Comment: (NOTE) The Xpert Xpress SARS-CoV-2/FLU/RSV plus assay is intended as an aid in the diagnosis of influenza from Nasopharyngeal swab specimens and should not be used as a sole basis for treatment. Nasal washings and aspirates are unacceptable for Xpert Xpress SARS-CoV-2/FLU/RSV testing.  Fact Sheet for Patients: EntrepreneurPulse.com.au  Fact Sheet for Healthcare Providers: IncredibleEmployment.be  This test is not yet approved or cleared by the [place] FDA and has been authorized for detection and/or diagnosis of SARS-CoV-2 by FDA under an Emergency Use Authorization (EUA). This EUA will remain in effect (meaning this test can be used) for the duration of the COVID-19 declaration under Section 564(b)(1) of the Act, 21 U.S.C. section 360bbb-3(b)(1), unless the authorization is terminated or revoked.  Performed at [facility], [address] [place], [place] [address]     Coagulation Studies: No results for input(s): LABPROT, INR in the last 72 hours.  Urinalysis: No  results for input(s): COLORURINE, LABSPEC, PHURINE, GLUCOSEU, HGBUR, BILIRUBINUR, KETONESUR, PROTEINUR, UROBILINOGEN, NITRITE, LEUKOCYTESUR in the last 72 hours.  Invalid input(s): APPERANCEUR    Imaging: US Venous Img Upper Uni Left (DVT)  Result Date: 02/27/2021 CLINICAL DATA:  Left upper extremity edema. Patient is status post ligation of arteriovenous graft on 02/26/2021. EXAM: LEFT UPPER EXTREMITY VENOUS DOPPLER ULTRASOUND TECHNIQUE: Gray-scale sonography with graded compression, as well as color Doppler and duplex ultrasound were performed to evaluate the upper extremity deep venous system from the level of the subclavian vein and including the jugular, axillary, basilic, radial, ulnar and upper cephalic vein. Spectral Doppler was utilized to evaluate flow at rest and with distal augmentation maneuvers. COMPARISON:  None. FINDINGS: Contralateral Subclavian Vein: Respiratory phasicity is normal and symmetric with the symptomatic side. No evidence of thrombus. Normal compressibility. Internal Jugular Vein: No evidence of thrombus. Normal compressibility, respiratory phasicity and response to augmentation. Subclavian Vein: No evidence of thrombus. Normal compressibility, respiratory phasicity and response to augmentation. Axillary Vein: No evidence of thrombus. Normal  compressibility, respiratory phasicity and response to augmentation. Cephalic Vein: No evidence of thrombus. Normal compressibility, respiratory phasicity and response to augmentation. Basilic Vein: No evidence of thrombus. Normal compressibility, respiratory phasicity and response to augmentation. Brachial Veins: No evidence of thrombus. Normal compressibility, respiratory phasicity and response to augmentation. Radial Veins: No evidence of thrombus. Normal compressibility, respiratory phasicity and response to augmentation. Ulnar Veins: No evidence of thrombus. Normal compressibility, respiratory phasicity and response to augmentation.  Venous Reflux:  None visualized. Other Findings: Occluded arteriovenous graft visualized. Irregular amorphous complex fluid partially encircles the graft. There are areas of more solid or cystic component. No evidence of internal vascularity. No evidence of peripheral hyperemia. IMPRESSION: No evidence of DVT within the left upper extremity. Occluded left upper extremity arteriovenous graft with complex fluid partially encircling the graft in the proximal upper arm. Differential considerations include postoperative hematoma versus abscess (in the appropriate clinical setting). Given the sonographic appearance, hematoma is favored. Electronically Signed   By: Jacqulynn Cadet M.D.   On: 02/27/2021 11:05     Medications:    sodium chloride     sodium chloride      amLODipine  5 mg Oral Daily   apixaban  2.5 mg Oral BID   atorvastatin  80 mg Oral Daily   carvedilol  6.25 mg Oral BID WC   Chlorhexidine Gluconate Cloth  6 each Topical Q0600   ezetimibe  10 mg Oral Daily   ferrous sulfate  325 mg Oral Q1500   gabapentin  100 mg Oral QHS   hydrALAZINE  50 mg Oral BID   insulin aspart  0-5 Units Subcutaneous QHS   insulin aspart  0-9 Units Subcutaneous TID WC   mirtazapine  7.5 mg Oral QHS   sodium chloride, sodium chloride, acetaminophen, alteplase, heparin, lidocaine (PF), lidocaine-prilocaine, liver oil-zinc oxide, morphine injection, pentafluoroprop-tetrafluoroeth, senna-docusate, simethicone  Assessment/ Plan:  Brianna Dodson is a 87 y.o.  female with past medical history of atrial fib, anemia, hypertension, hyperlipidemia, diabetes, stroke, and ESRD on dialysis. Patient presents to ED with complaints of left arm pain since previous night.   [facility] [address]/MWF/Rt Permcath   1. End stage renal disease on dialysis Will maintain outpatient schedule if possible Received dialysis yesterday, tolerated well. No UF Next treatment scheduled for Wednesday  2. Anemia of chronic  kidney disease Lab Results  Component Value Date   HGB 8.6 (L) 03/01/2021  Hgb below target May consider EPO with treatments.    3. Secondary Hyperparathyroidism:  Lab Results  Component Value Date   PTH 83 (H) 01/06/2016   CALCIUM 7.8 (L) 03/01/2021   CAION 1.04 (L) 02/24/2021   PHOS 3.6 02/28/2021     Calcium not at goal. Phosphorus 3.6, at goal Calcium carbonate ordered BID   4. Hypertension  Home regimen includes amlodipine, carvedilol, and hydralazine. Current on these medications   LOS: 3   8/30/202210:22 AM

## 2021-03-01 NOTE — Progress Notes (Signed)
Patient's LUE wrapped with coban per MD request to help alleviate swelling. Patient and family educated.

## 2021-03-01 NOTE — Progress Notes (Addendum)
PROGRESS NOTE    Brianna Dodson  LFY:[id number] DOB: 1934-03-12 DOA: 02/25/2021 PCP: [name], MD   Brief Narrative:  Brianna Dodson is a 87 y.o. female with medical history significant of ESRD-HD (MWF), hypertension, hyperlipidemia, diabetes mellitus,'s embolic stroke, atrial fibrillation on Eliquis, depression, OSA, HCV, anemia, Alzheimer's disease, who presents with left arm pain and weakness. Pt had left arm AV graft placement procedure 8/25 with vascular surgery.  After she went home, she developed severe pain in the left arm, which is constant, sharp, throbbing-like, nonradiating.  Patient also reports weakness in left forearm, particularly in the left wrist and hand. She has been off Eliquis from 8/22-8/25 for surgical procedure.  She has not restarted Eliquis yet. In ED MRI showed possible new stroke. Pt is placed in med-surg bed for obs. Dr. [name] of VVS, Dr. Juleen China of renal, Dr. Rory Percy of neuro are consulted.  Assessment & Plan:   Acute right parietal lobe infarct, POA - Neurology and Vascular following - appreciate insight and recs - Continue neuro checks, defer repeat imaging per consults - OK to continue Eliquis per vascular - A1C 6.8 - Lipid panel WNL - PT/OT consult - SNF pending  Left arm pain/weakness after AV graft placement:  - Vascular surgery following, appreciate insight and recommendations  - Neurology - unlikely acute CVA is related to weakness/deficits - Concern for steal syndrome given imaging -this post ligation of the AV graft - L arm continues to be flaccid distal to the proximal upper arm but sensation improving distally - continue PT/OT - recs for SNF currently  Acute hydrostatic bullae of LUE Acute edema secondary to above - Continue to elevate, compression wrap today to hopefully improve edema - Defer to vascular for any further imaging intervention or procedure per their expertise  Essential hypertension - IV hydralazine as needed - Amlodipine,  Coreg, hydralazine   ESRD-HD: - Nephrology consulted for dialysis   HLD (hyperlipidemia) - Lipitor and Zetia   Type II diabetes mellitus with renal manifestations ([place]):  - Recent A1c 6.8, borderline controlled.  Patient is taking Tradjenta - Sliding scale insulin   Anemia in ESRD (end-stage renal disease) ([place]):  - Hemoglobin stable - Continue iron supplement   Atrial fibrillation (HCC) - Coreg - Continue Eliquis; per vascular - okay to resume     Thrombocytopenia ([place]):  - This is chronic issue.  Stable in 110s - No bleeding tendency. - Follow-up with CBC  DVT prophylaxis: SCDs only, hold anticoagulation given above Code Status: Partial, DO NOT INTUBATE Family Communication: None present  Status is: Inpatient  Dispo: The patient is from: Home              Anticipated d/c is to: SNF              Anticipated d/c date is: 24-48 hours              Patient currently not medically stable for discharge  Consultants:  Nephrology, neuro, vascular surgery  Procedures:  Tentative plan for graft ligation  Antimicrobials:  None indicated  Subjective: No acute issues or events overnight denies nausea vomiting diarrhea constipation headache fevers chills or chest pain -arm pain resolving, edema ongoing as is weakness.  Objective: Vitals:   02/28/21 1304 02/28/21 1605 02/28/21 2112 03/01/21 0455  BP: 125/66 122/67 (!) 109/51 126/68  Pulse: 91 79 70 70  Resp: 18 16 16 16   Temp: 97.8 F (36.6 C) 97.8 F (36.6 C) 98.2 F (36.8 C) 98.4  F (36.9 C)  TempSrc:  Oral Oral Oral  SpO2: 100% 98% 97% 93%  Weight:      Height:        Intake/Output Summary (Last 24 hours) at 03/01/2021 0725 Last data filed at 02/28/2021 1900 Gross per 24 hour  Intake 0 ml  Output 0 ml  Net 0 ml    Filed Weights   02/25/21 0816 02/25/21 1756  Weight: 65.3 kg 68.4 kg    Examination:  General:  Pleasantly resting in bed, No acute distress. HEENT:  Normocephalic atraumatic.  Sclerae  nonicteric, noninjected.  Extraocular movements intact bilaterally. Neck:  Without mass or deformity.  Trachea is midline. Lungs:  Clear to auscultate bilaterally without rhonchi, wheeze, or rales. Heart:  Regular rate and rhythm.  Without murmurs, rubs, or gallops. Abdomen:  Soft, nontender, nondistended.  Without guarding or rebound. Extremities: Left arm flaccid distal to the elbow, sensation intact and equal bilaterally otherwise; notable hydrostatic bullae lateral left forearm with 2+ pitting edema Vascular:  Dorsalis pedis and posterior tibial pulses palpable bilaterally. Skin:  Warm and dry, no erythema, no ulcerations.  Data Reviewed: I have personally reviewed following labs and imaging studies  CBC: Recent Labs  Lab 02/24/21 1141 02/24/21 1153 02/25/21 0841 02/27/21 0530 02/28/21 0502 03/01/21 0544  WBC 2.8*  --  6.9 5.0 5.4 5.3  NEUTROABS  --   --  5.6  --   --   --   HGB 10.0* 9.2* 10.5* 11.7* 9.7* 8.6*  HCT 28.2* 27.0* 29.7* 31.5* 27.6* 23.9*  MCV 88.4  --  88.1 87.7 90.8 87.9  PLT 111*  --  115* 119* 112* 119*    Basic Metabolic Panel: Recent Labs  Lab 02/24/21 1153 02/25/21 0841 02/27/21 0530 02/28/21 0617 03/01/21 0544  NA 141 139 135 133* 135  K 3.6 3.6 3.4* 3.9 3.7  CL 97* 97* 95* 96* 97*  CO2  --  32 30 28 29   GLUCOSE 156* 225* 149* 138* 123*  BUN 20 27* 25* 38* 26*  CREATININE 3.00* 3.40* 3.88* 5.67* 4.28*  CALCIUM  --  9.2 8.5* 8.0* 7.8*  PHOS  --   --   --  3.6  --     GFR: Estimated Creatinine Clearance: 9.3 mL/min (A) (by C-G formula based on SCr of 4.28 mg/dL (H)). Liver Function Tests: No results for input(s): AST, ALT, ALKPHOS, BILITOT, PROT, ALBUMIN in the last 168 hours. No results for input(s): LIPASE, AMYLASE in the last 168 hours. No results for input(s): AMMONIA in the last 168 hours. Coagulation Profile: No results for input(s): INR, PROTIME in the last 168 hours. Cardiac Enzymes: No results for input(s): CKTOTAL, CKMB,  CKMBINDEX, TROPONINI in the last 168 hours. BNP (last 3 results) No results for input(s): PROBNP in the last 8760 hours. HbA1C: No results for input(s): HGBA1C in the last 72 hours.  CBG: Recent Labs  Lab 02/27/21 1945 02/28/21 0804 02/28/21 1253 02/28/21 1549 02/28/21 2114  GLUCAP 151* 139* 133* 183* 148*    Lipid Profile: No results for input(s): CHOL, HDL, LDLCALC, TRIG, CHOLHDL, LDLDIRECT in the last 72 hours.  Thyroid Function Tests: No results for input(s): TSH, T4TOTAL, FREET4, T3FREE, THYROIDAB in the last 72 hours. Anemia Panel: No results for input(s): VITAMINB12, FOLATE, FERRITIN, TIBC, IRON, RETICCTPCT in the last 72 hours. Sepsis Labs: No results for input(s): PROCALCITON, LATICACIDVEN in the last 168 hours.  Recent Results (from the past 240 hour(s))  Resp Panel by RT-PCR (Flu A&B, Covid)  Nasopharyngeal Swab     Status: None   Collection Time: 02/25/21 12:01 PM   Specimen: Nasopharyngeal Swab; Nasopharyngeal(NP) swabs in vial transport medium  Result Value Ref Range Status   SARS Coronavirus 2 by RT PCR NEGATIVE NEGATIVE Final    Comment: (NOTE) SARS-CoV-2 target nucleic acids are NOT DETECTED.  The SARS-CoV-2 RNA is generally detectable in upper respiratory specimens during the acute phase of infection. The lowest concentration of SARS-CoV-2 viral copies this assay can detect is 138 copies/mL. A negative result does not preclude SARS-Cov-2 infection and should not be used as the sole basis for treatment or other patient management decisions. A negative result may occur with  improper specimen collection/handling, submission of specimen other than nasopharyngeal swab, presence of viral mutation(s) within the areas targeted by this assay, and inadequate number of viral copies(<138 copies/mL). A negative result must be combined with clinical observations, patient history, and epidemiological information. The expected result is Negative.  Fact Sheet for  Patients:  EntrepreneurPulse.com.au  Fact Sheet for Healthcare Providers:  IncredibleEmployment.be  This test is no t yet approved or cleared by the [place] FDA and  has been authorized for detection and/or diagnosis of SARS-CoV-2 by FDA under an Emergency Use Authorization (EUA). This EUA will remain  in effect (meaning this test can be used) for the duration of the COVID-19 declaration under Section 564(b)(1) of the Act, 21 U.S.C.section 360bbb-3(b)(1), unless the authorization is terminated  or revoked sooner.       Influenza A by PCR NEGATIVE NEGATIVE Final   Influenza B by PCR NEGATIVE NEGATIVE Final    Comment: (NOTE) The Xpert Xpress SARS-CoV-2/FLU/RSV plus assay is intended as an aid in the diagnosis of influenza from Nasopharyngeal swab specimens and should not be used as a sole basis for treatment. Nasal washings and aspirates are unacceptable for Xpert Xpress SARS-CoV-2/FLU/RSV testing.  Fact Sheet for Patients: EntrepreneurPulse.com.au  Fact Sheet for Healthcare Providers: IncredibleEmployment.be  This test is not yet approved or cleared by the [place] FDA and has been authorized for detection and/or diagnosis of SARS-CoV-2 by FDA under an Emergency Use Authorization (EUA). This EUA will remain in effect (meaning this test can be used) for the duration of the COVID-19 declaration under Section 564(b)(1) of the Act, 21 U.S.C. section 360bbb-3(b)(1), unless the authorization is terminated or revoked.  Performed at [facility], [address], [place], [place] [address]    Radiology Studies: US Venous Img Upper Uni Left (DVT)  Result Date: 02/27/2021 CLINICAL DATA:  Left upper extremity edema. Patient is status post ligation of arteriovenous graft on 02/26/2021. EXAM: LEFT UPPER EXTREMITY VENOUS DOPPLER ULTRASOUND TECHNIQUE: Gray-scale sonography with graded compression,  as well as color Doppler and duplex ultrasound were performed to evaluate the upper extremity deep venous system from the level of the subclavian vein and including the jugular, axillary, basilic, radial, ulnar and upper cephalic vein. Spectral Doppler was utilized to evaluate flow at rest and with distal augmentation maneuvers. COMPARISON:  None. FINDINGS: Contralateral Subclavian Vein: Respiratory phasicity is normal and symmetric with the symptomatic side. No evidence of thrombus. Normal compressibility. Internal Jugular Vein: No evidence of thrombus. Normal compressibility, respiratory phasicity and response to augmentation. Subclavian Vein: No evidence of thrombus. Normal compressibility, respiratory phasicity and response to augmentation. Axillary Vein: No evidence of thrombus. Normal compressibility, respiratory phasicity and response to augmentation. Cephalic Vein: No evidence of thrombus. Normal compressibility, respiratory phasicity and response to augmentation. Basilic Vein: No evidence of thrombus. Normal  compressibility, respiratory phasicity and response to augmentation. Brachial Veins: No evidence of thrombus. Normal compressibility, respiratory phasicity and response to augmentation. Radial Veins: No evidence of thrombus. Normal compressibility, respiratory phasicity and response to augmentation. Ulnar Veins: No evidence of thrombus. Normal compressibility, respiratory phasicity and response to augmentation. Venous Reflux:  None visualized. Other Findings: Occluded arteriovenous graft visualized. Irregular amorphous complex fluid partially encircles the graft. There are areas of more solid or cystic component. No evidence of internal vascularity. No evidence of peripheral hyperemia. IMPRESSION: No evidence of DVT within the left upper extremity. Occluded left upper extremity arteriovenous graft with complex fluid partially encircling the graft in the proximal upper arm. Differential considerations  include postoperative hematoma versus abscess (in the appropriate clinical setting). Given the sonographic appearance, hematoma is favored. Electronically Signed   By: Jacqulynn Cadet M.D.   On: 02/27/2021 11:05    Scheduled Meds:  amLODipine  5 mg Oral Daily   apixaban  2.5 mg Oral BID   atorvastatin  80 mg Oral Daily   carvedilol  6.25 mg Oral BID WC   Chlorhexidine Gluconate Cloth  6 each Topical Q0600   ezetimibe  10 mg Oral Daily   ferrous sulfate  325 mg Oral Q1500   gabapentin  100 mg Oral QHS   hydrALAZINE  50 mg Oral BID   insulin aspart  0-5 Units Subcutaneous QHS   insulin aspart  0-9 Units Subcutaneous TID WC   mirtazapine  7.5 mg Oral QHS   Continuous Infusions:  sodium chloride     sodium chloride      LOS: 3 days   Time spent: 80min  Tritia Endo C [place], DO [facility]  If 7PM-7AM, please contact night-coverage [email or web address]  03/01/2021, 7:25 AM

## 2021-03-01 NOTE — TOC Progression Note (Signed)
Transition of Care [facility]) - Progression Note    Patient Details  Name: Jovonda Selner MRN: [id number] Date of Birth: 1933/12/04  [facility]) CM/SW [place], RN Phone Number: 03/01/2021, 4:05 PM  Clinical Narrative:   As per Dorian [facility] at [facility], the facility is able to transport patient to and from dialysis at [facility] in [place] MWF at 1150am.      Expected Discharge Plan: [place] Barriers to Discharge: Continued Medical Work up  Expected Discharge Plan and Services Expected Discharge Plan: [place]   Discharge Planning Services: CM Consult Post Acute Care Choice: [place], Resumption of Svcs/PTA Provider Living arrangements for the past 2 months: Single Family Home                 DME Arranged: N/A DME Agency: NA       HH Arranged: RN, PT, OT, Nurse's Aide HH Agency: [place] Date [facility] Agency Contacted: 02/26/21 Time [place]: 39 Representative spoke with at [place]: [place] ([place]) Interventions    Readmission Risk Interventions Readmission Risk Prevention Plan 03/16/2019  Transportation Screening Complete  PCP or Specialist Appt within 5-7 Days Complete  Home Care Screening Complete  Medication Review (RN CM) Complete  Some recent data might be hidden

## 2021-03-01 NOTE — Progress Notes (Signed)
Physical Therapy Treatment Patient Details Name: Brianna Dodson MRN: [id number] DOB: 03/21/1934 Today's Date: 03/01/2021    History of Present Illness Pt is an 87 y/o F with PMH: multiple strokes most recent right MCA stroke in March 2022 with residual left-sided weakness, with worsening left-sided weakness after left upper extremity AV fistula surgery on 8/25 (Dr. [name]). Pt presented to ED d/t L arm pain/throbbing starting the night after the procedure. MRI reviewed by neurology and assessment as follows: "although formal radiology read is concerning for acute on chronic infarction it looks like cortical laminar necrosis and not like a new stroke.  She does have left hand grip weakness and left wrist drop which is new from before, but I do not suspect that this is from a new stroke." Pt now s/p ligation of L UE HD cath site to improve perfusion on 02/26/21.    PT Comments    Pt agreeable to therapy, however feels dizzy at this time. BP obtained in seated position at 89/37. 2nd BP obtained post there-ex including 81/37. RN notified. Not safe to ambulate at this time. Able to perform seated there-ex. Able to transfer to [facility] and back to recliner. Will continue to progress as able.   Follow Up Recommendations  SNF     Equipment Recommendations  None recommended by PT    Recommendations for Other Services       Precautions / Restrictions Precautions Precautions: Fall Restrictions Weight Bearing Restrictions: No    Mobility  Bed Mobility Overal bed mobility: Needs Assistance Bed Mobility: Supine to Sit     Supine to sit: Min assist     General bed mobility comments: not performed as received in recliner    Transfers Overall transfer level: Needs assistance Equipment used: 1 person hand held assist Transfers: Sit to/from Stand Sit to Stand: Min assist         General transfer comment: safe technique. Use of SPC during standing. Due to low BP, further mobility deferred at this  time  Ambulation/Gait             General Gait Details: not safe to perform at this time due to BP   Stairs             Wheelchair Mobility    Modified Rankin (Stroke Patients Only)       Balance Overall balance assessment: Needs assistance Sitting-balance support: Feet supported Sitting balance-Leahy Scale: Fair     Standing balance support: Single extremity supported Standing balance-Leahy Scale: Fair                              Cognition Arousal/Alertness: Awake/alert Behavior During Therapy: WFL for tasks assessed/performed Overall Cognitive Status: History of cognitive impairments - at baseline                                 General Comments: speech slightly dysarthric this date.      Exercises General Exercises - Upper Extremity Shoulder Flexion: Self ROM;Left;10 reps;Seated Elbow Flexion: Self ROM;Left;10 reps;Seated Elbow Extension: Self ROM;Left;10 reps;Seated Wrist Flexion: Self ROM;Left;10 reps;Seated Digit Composite Flexion: Self ROM;Left;10 reps;Seated Other Exercises Other Exercises: seated ther-ex performed on B LE including LAQ, hip add squeezes, alt marching, AP, calf raises, and sit<>Stands. All ther-ex performed ranging from 5-10 reps. Other Exercises: ambulated over to [facility] right beside recliner for bathroom. Unable to have BM.  Returned back to reclienr with alarm engaged    General Comments        Pertinent Vitals/Pain Pain Assessment: No/denies pain Faces Pain Scale: Hurts little more Pain Location: L UE with movement Pain Descriptors / Indicators: Grimacing;Guarding Pain Intervention(s): Limited activity within patient's tolerance;Repositioned    Home Living                      Prior Function            PT Goals (current goals can now be found in the care plan section) Acute Rehab PT Goals Patient Stated Goal: to go home PT Goal Formulation: With patient Time For Goal Achievement:  03/12/21 Potential to Achieve Goals: Good Progress towards PT goals: Progressing toward goals    Frequency    7X/week      PT Plan Current plan remains appropriate    Co-evaluation              AM-PAC PT "6 Clicks" Mobility   Outcome Measure  Help needed turning from your back to your side while in a flat bed without using bedrails?: A Little Help needed moving from lying on your back to sitting on the side of a flat bed without using bedrails?: A Little Help needed moving to and from a bed to a chair (including a wheelchair)?: A Little Help needed standing up from a chair using your arms (e.g., wheelchair or bedside chair)?: A Little Help needed to walk in hospital room?: A Little Help needed climbing 3-5 steps with a railing? : A Lot 6 Click Score: 17    End of Session Equipment Utilized During Treatment: Gait belt (L UE sling) Activity Tolerance: Patient tolerated treatment well Patient left: in chair;with chair alarm set Nurse Communication: Mobility status PT Visit Diagnosis: Unsteadiness on feet (R26.81);Muscle weakness (generalized) (M62.81);History of falling (Z91.81);Difficulty in walking, not elsewhere classified (R26.2)     Time: 5643-3295 PT Time Calculation (min) (ACUTE ONLY): 25 min  Charges:  $Therapeutic Exercise: 8-22 mins $Therapeutic Activity: 8-22 mins                     Greggory Stallion, PT, DPT [phone]    Shakoya Gilmore 03/01/2021, 2:53 PM

## 2021-03-01 NOTE — Consult Note (Signed)
[facility] Nurse Consult Note: Reason for Consult:intact, serum filled blisters on left forearm. Vascular MS had given orders for if/when blisters rupture to use silver sulfadiazine. Wound type: partial thickness Pressure Injury POA: N/A Measurement: several intact, serum filled blisters to left forearm, no ruptured blisters in a 10 x 8cm area. Wound bed:N/A Drainage (amount, consistency, odor) N/A Periwound: edematous Dressing procedure/placement/frequency: I have provided Nursing with guidance for the care of the intact, serum filled blisters I.r., to cleanse daily with NS, cover with folded pieces of xeroform gauze, top with ABD pad and secure with a few turns of Kerlix roll gauze.  Elevate left arm on pillow. [facility] MD has provided guidance for care of the wounds if/when the blisters rupture.  [place] nursing team will not follow, but will remain available to this patient, the nursing and medical teams.  Please re-consult if needed. Thanks, Maudie Flakes, MSN, RN, [place], Arther Abbott  Pager# [phone]

## 2021-03-01 NOTE — Progress Notes (Signed)
Occupational Therapy Treatment Patient Details Name: Brianna Dodson MRN: [id number] DOB: 1933-12-26 Today's Date: 03/01/2021    History of present illness Pt is an 87 y/o F with PMH: multiple strokes most recent right MCA stroke in March 2022 with residual left-sided weakness, with worsening left-sided weakness after left upper extremity AV fistula surgery on 8/25 (Dr. [name]). Pt presented to ED d/t L arm pain/throbbing starting the night after the procedure. MRI reviewed by neurology and assessment as follows: "although formal radiology read is concerning for acute on chronic infarction it looks like cortical laminar necrosis and not like a new stroke.  She does have left hand grip weakness and left wrist drop which is new from before, but I do not suspect that this is from a new stroke." Pt now s/p ligation of L UE HD cath site to improve perfusion on 02/26/21.   OT comments  Pt seen for OT treatment on this date. Upon arrival to room, pt awake and seated upright in bed. Pt agreeable to OT tx. Pt presents with decreased LUE ROM/strength, decreased sitting and standing balance, decreased strength, and decreased activity tolerance. Due to these functional impairments, pt requires MIN A for bed mobility with HOB elevated, MOD A for seated UB dressing at EOB, MIN A for functional mobility of short household distance with 1-person HHA, and MIN A for standing grooming tasks. This date, pt provided with HEP for self ROM of LUE, with pt demonstrating good understanding. At end of session, pt left in recliner with PT. Pt continues to benefit from skilled OT services to maximize return to PLOF and minimize risk of future falls, injury, caregiver burden, and readmission. Will continue to follow POC. Discharge recommendation remains appropriate.     Follow Up Recommendations  SNF    Equipment Recommendations  3 in 1 bedside commode;Tub/shower seat       Precautions / Restrictions Precautions Precautions:  Fall Restrictions Weight Bearing Restrictions: No       Mobility Bed Mobility Overal bed mobility: Needs Assistance Bed Mobility: Supine to Sit     Supine to sit: Min assist     General bed mobility comments: MIN A for trunk control during transfer and for intermittent trunk support d/t poor static sitting balance at EOB    Transfers Overall transfer level: Needs assistance Equipment used: 1 person hand held assist Transfers: Sit to/from Stand Sit to Stand: Min assist                  ADL either performed or assessed with clinical judgement   ADL Overall ADL's : Needs assistance/impaired     Grooming: Wash/dry face;Brushing hair;Minimal assistance;Standing Grooming Details (indicate cue type and reason): MIN A for standing balance while performing grooming tasks with RUE following SET-UP assist         Upper Body Dressing : Moderate assistance;Sitting Upper Body Dressing Details (indicate cue type and reason): MOD A to don/doff hospital gown seated EOB via hemi-dressing technique. Intermittent trunk support required in setting of R lateral lean                 Functional mobility during ADLs: Minimal assistance (with 1-person HHA on R side)        Cognition Arousal/Alertness: Awake/alert Behavior During Therapy: WFL for tasks assessed/performed Overall Cognitive Status: History of cognitive impairments - at baseline  General Comments: Pleasant and agreeable to therapy. Can follow commands well, however demonstrates confusion to situation        Exercises General Exercises - Upper Extremity Shoulder Flexion: Self ROM;Left;10 reps;Seated Elbow Flexion: Self ROM;Left;10 reps;Seated Elbow Extension: Self ROM;Left;10 reps;Seated Wrist Flexion: Self ROM;Left;10 reps;Seated Digit Composite Flexion: Self ROM;Left;10 reps;Seated           Pertinent Vitals/ Pain       Pain Assessment: Faces Faces Pain  Scale: Hurts little more Pain Location: L UE with movement Pain Descriptors / Indicators: Grimacing;Guarding Pain Intervention(s): Limited activity within patient's tolerance;Repositioned         Frequency  Min 1X/week        Progress Toward Goals  OT Goals(current goals can now be found in the care plan section)  Progress towards OT goals: Progressing toward goals  Acute Rehab OT Goals Patient Stated Goal: to go home OT Goal Formulation: With patient/family Time For Goal Achievement: 03/11/21 Potential to Achieve Goals: [place] Discharge plan remains appropriate;Frequency remains appropriate       AM-PAC OT "6 Clicks" Daily Activity     Outcome Measure   Help from another person eating meals?: None Help from another person taking care of personal grooming?: A Little Help from another person toileting, which includes using toliet, bedpan, or urinal?: A Little Help from another person bathing (including washing, rinsing, drying)?: A Lot Help from another person to put on and taking off regular upper body clothing?: A Lot Help from another person to put on and taking off regular lower body clothing?: A Lot 6 Click Score: 16    End of Session Equipment Utilized During Treatment: Gait belt  OT Visit Diagnosis: Unsteadiness on feet (R26.81);History of falling (Z91.81)   Activity Tolerance Patient tolerated treatment well   Patient Left in chair;with call bell/phone within reach;Other (comment) (with PT)   Nurse Communication Mobility status        Time: 9794-8016 OT Time Calculation (min): 28 min  Charges: OT General Charges $OT Visit: 1 Visit OT Treatments $Self Care/Home Management : 8-22 mins $Therapeutic Activity: 8-22 mins  Fredirick Maudlin, OTR/L [place]

## 2021-03-02 DIAGNOSIS — N186 End stage renal disease: Secondary | ICD-10-CM | POA: Diagnosis not present

## 2021-03-02 DIAGNOSIS — Z992 Dependence on renal dialysis: Secondary | ICD-10-CM | POA: Diagnosis not present

## 2021-03-02 LAB — CBC
HCT: 23.7 % — ABNORMAL LOW (ref 36.0–46.0)
Hemoglobin: 8.4 g/dL — ABNORMAL LOW (ref 12.0–15.0)
MCH: 31.3 pg (ref 26.0–34.0)
MCHC: 35.4 g/dL (ref 30.0–36.0)
MCV: 88.4 fL (ref 80.0–100.0)
Platelets: 122 10*3/uL — ABNORMAL LOW (ref 150–400)
RBC: 2.68 MIL/uL — ABNORMAL LOW (ref 3.87–5.11)
RDW: 14.4 % (ref 11.5–15.5)
WBC: 4.5 10*3/uL (ref 4.0–10.5)
nRBC: 0 % (ref 0.0–0.2)

## 2021-03-02 LAB — BASIC METABOLIC PANEL
Anion gap: 8 (ref 5–15)
BUN: 35 mg/dL — ABNORMAL HIGH (ref 8–23)
CO2: 29 mmol/L (ref 22–32)
Calcium: 8 mg/dL — ABNORMAL LOW (ref 8.9–10.3)
Chloride: 97 mmol/L — ABNORMAL LOW (ref 98–111)
Creatinine, Ser: 5.37 mg/dL — ABNORMAL HIGH (ref 0.44–1.00)
GFR, Estimated: 7 mL/min — ABNORMAL LOW (ref >60)
Glucose, Bld: 168 mg/dL — ABNORMAL HIGH (ref 70–99)
Potassium: 3.7 mmol/L (ref 3.5–5.1)
Sodium: 134 mmol/L — ABNORMAL LOW (ref 135–145)

## 2021-03-02 LAB — GLUCOSE, CAPILLARY
Glucose-Capillary: 146 mg/dL — ABNORMAL HIGH (ref 70–99)
Glucose-Capillary: 172 mg/dL — ABNORMAL HIGH (ref 70–99)
Glucose-Capillary: 174 mg/dL — ABNORMAL HIGH (ref 70–99)
Glucose-Capillary: 186 mg/dL — ABNORMAL HIGH (ref 70–99)
Glucose-Capillary: 142 mg/dL — ABNORMAL HIGH (ref 70–99)

## 2021-03-02 LAB — RESP PANEL BY RT-PCR (FLU A&B, COVID) ARPGX2
Influenza A by PCR: NEGATIVE
Influenza B by PCR: NEGATIVE
SARS Coronavirus 2 by RT PCR: NEGATIVE

## 2021-03-02 NOTE — Progress Notes (Signed)
PROGRESS NOTE    Brianna Dodson  HWE:[id number] DOB: 07-18-1933 DOA: 02/25/2021 PCP: [name], MD   Brief Narrative:  Brianna Dodson is a 87 y.o. female with medical history significant of ESRD-HD (MWF), hypertension, hyperlipidemia, diabetes mellitus,'s embolic stroke, atrial fibrillation on Eliquis, depression, OSA, HCV, anemia, Alzheimer's disease, who presents with left arm pain and weakness. Pt had left arm AV graft placement procedure 8/25 with vascular surgery.  After she went home, she developed severe pain in the left arm, which is constant, sharp, throbbing-like, nonradiating.  Patient also reports weakness in left forearm, particularly in the left wrist and hand. She has been off Eliquis from 8/22-8/25 for surgical procedure.  She has not restarted Eliquis yet. In ED MRI showed possible new stroke. Pt is placed in med-surg bed for obs. Dr. [name] of [facility], Dr. Juleen China of renal, Dr. Rory Percy of neuro are consulted.  Assessment & Plan:   Acute right parietal lobe infarct, POA - Neurology and Vascular following - appreciate insight and recs - Continue neuro checks, defer repeat imaging per consults - OK to continue Eliquis per vascular - A1C 6.8 - Lipid panel WNL - PT/OT consult - SNF pending  Left arm pain/weakness after AV graft placement:  - Vascular surgery following, appreciate insight and recommendations  - Neurology - unlikely acute CVA is related to weakness/deficits - Concern for steal syndrome given imaging -this post ligation of the AV graft - L arm continues to be flaccid distal to the proximal upper arm but sensation improving distally - continue PT/OT - recs for SNF currently  Acute hydrostatic bullae of LUE Acute edema secondary to above - Continue to elevate, compression wrap today to hopefully improve edema - Defer to vascular for any further imaging intervention or procedure per their expertise  Essential hypertension - IV hydralazine as needed - Amlodipine,  Coreg, hydralazine   ESRD-HD: - Nephrology consulted for dialysis   HLD (hyperlipidemia) - Lipitor and Zetia   Type II diabetes mellitus with renal manifestations ([place]):  - Recent A1c 6.8, borderline controlled.  Patient is taking Tradjenta - Sliding scale insulin   Anemia in ESRD (end-stage renal disease) ([place]):  - Hemoglobin stable - Continue iron supplement   Atrial fibrillation (HCC) - Coreg - Continue Eliquis; per vascular - okay to resume     Thrombocytopenia ([place]):  - This is chronic issue.  Stable in 110s - No bleeding tendency. - Follow-up with CBC  Fever Fever 100.5 this afternoon on 8/31 Current source, check blood culture Incentive spirometer Monitor fever curve  DVT prophylaxis: SCDs only, hold anticoagulation given above Code Status: Partial, DO NOT INTUBATE Family Communication: None present  Status is: Inpatient  Dispo: The patient is from: Home              Anticipated d/c is to: SNF              Anticipated d/c date is: 24-48 hours              If no fever and cleared by vascular surgery  Consultants:  Nephrology, neuro, vascular surgery  Procedures:  Tentative plan for graft ligation  Antimicrobials:  None indicated  Subjective:  She is seen after return from dialysis She is alert and oriented x3 Denies acute complaints, left arm remains edematous and weak She states 'I am leaving tomorrow" T max 100.5 this pm, she denies new complaints  Objective: Vitals:   03/02/21 1245 03/02/21 1300 03/02/21 1334 03/02/21 1735  BP:  136/86 (!) 124/59 (!) 117/54 (!) 118/46  Pulse: 75 79 77 79  Resp: 18 17 16 16   Temp:   97.8 F (36.6 C) (!) 100.5 F (38.1 C)  TempSrc:    Oral  SpO2:   99% 100%  Weight:      Height:        Intake/Output Summary (Last 24 hours) at 03/02/2021 1925 Last data filed at 03/02/2021 1240 Gross per 24 hour  Intake --  Output 0 ml  Net 0 ml   Filed Weights   02/25/21 0816 02/25/21 1756  Weight: 65.3 kg 68.4 kg     Examination:  General:  Pleasantly resting in bed, No acute distress. HEENT:  Normocephalic atraumatic.  Sclerae nonicteric, noninjected.  Extraocular movements intact bilaterally. Neck:  Without mass or deformity.  Trachea is midline. Lungs:  Clear to auscultate bilaterally without rhonchi, wheeze, or rales. Heart:  Regular rate and rhythm.  Without murmurs, rubs, or gallops. Abdomen:  Soft, nontender, nondistended.  Without guarding or rebound. Extremities: Left arm flaccid distal to the elbow, sensation intact and equal bilaterally otherwise; notable hydrostatic bullae lateral left forearm with 2+ pitting edema Vascular:  Dorsalis pedis and posterior tibial pulses palpable bilaterally. Skin:  Warm and dry, no erythema, no ulcerations.  Data Reviewed: I have personally reviewed following labs and imaging studies  CBC: Recent Labs  Lab 02/25/21 0841 02/27/21 0530 02/28/21 0502 03/01/21 0544 03/02/21 0453  WBC 6.9 5.0 5.4 5.3 4.5  NEUTROABS 5.6  --   --   --   --   HGB 10.5* 11.7* 9.7* 8.6* 8.4*  HCT 29.7* 31.5* 27.6* 23.9* 23.7*  MCV 88.1 87.7 90.8 87.9 88.4  PLT 115* 119* 112* 119* 518*   Basic Metabolic Panel: Recent Labs  Lab 02/25/21 0841 02/27/21 0530 02/28/21 0617 03/01/21 0544 03/02/21 0453  NA 139 135 133* 135 134*  K 3.6 3.4* 3.9 3.7 3.7  CL 97* 95* 96* 97* 97*  CO2 32 30 28 29 29   GLUCOSE 225* 149* 138* 123* 168*  BUN 27* 25* 38* 26* 35*  CREATININE 3.40* 3.88* 5.67* 4.28* 5.37*  CALCIUM 9.2 8.5* 8.0* 7.8* 8.0*  PHOS  --   --  3.6  --   --    GFR: Estimated Creatinine Clearance: 7.4 mL/min (A) (by C-G formula based on SCr of 5.37 mg/dL (H)). Liver Function Tests: No results for input(s): AST, ALT, ALKPHOS, BILITOT, PROT, ALBUMIN in the last 168 hours. No results for input(s): LIPASE, AMYLASE in the last 168 hours. No results for input(s): AMMONIA in the last 168 hours. Coagulation Profile: No results for input(s): INR, PROTIME in the last 168  hours. Cardiac Enzymes: No results for input(s): CKTOTAL, CKMB, CKMBINDEX, TROPONINI in the last 168 hours. BNP (last 3 results) No results for input(s): PROBNP in the last 8760 hours. HbA1C: No results for input(s): HGBA1C in the last 72 hours.  CBG: Recent Labs  Lab 03/01/21 2133 03/02/21 0810 03/02/21 1335 03/02/21 1341 03/02/21 1736  GLUCAP 128* 146* 174* 172* 186*   Lipid Profile: No results for input(s): CHOL, HDL, LDLCALC, TRIG, CHOLHDL, LDLDIRECT in the last 72 hours.  Thyroid Function Tests: No results for input(s): TSH, T4TOTAL, FREET4, T3FREE, THYROIDAB in the last 72 hours. Anemia Panel: No results for input(s): VITAMINB12, FOLATE, FERRITIN, TIBC, IRON, RETICCTPCT in the last 72 hours. Sepsis Labs: No results for input(s): PROCALCITON, LATICACIDVEN in the last 168 hours.  Recent Results (from the past 240 hour(s))  Resp Panel by  RT-PCR (Flu A&B, Covid) Nasopharyngeal Swab     Status: None   Collection Time: 02/25/21 12:01 PM   Specimen: Nasopharyngeal Swab; Nasopharyngeal(NP) swabs in vial transport medium  Result Value Ref Range Status   SARS Coronavirus 2 by RT PCR NEGATIVE NEGATIVE Final    Comment: (NOTE) SARS-CoV-2 target nucleic acids are NOT DETECTED.  The SARS-CoV-2 RNA is generally detectable in upper respiratory specimens during the acute phase of infection. The lowest concentration of SARS-CoV-2 viral copies this assay can detect is 138 copies/mL. A negative result does not preclude SARS-Cov-2 infection and should not be used as the sole basis for treatment or other patient management decisions. A negative result may occur with  improper specimen collection/handling, submission of specimen other than nasopharyngeal swab, presence of viral mutation(s) within the areas targeted by this assay, and inadequate number of viral copies(<138 copies/mL). A negative result must be combined with clinical observations, patient history, and  epidemiological information. The expected result is Negative.  Fact Sheet for Patients:  EntrepreneurPulse.com.au  Fact Sheet for Healthcare Providers:  IncredibleEmployment.be  This test is no t yet approved or cleared by the [place] FDA and  has been authorized for detection and/or diagnosis of SARS-CoV-2 by FDA under an Emergency Use Authorization (EUA). This EUA will remain  in effect (meaning this test can be used) for the duration of the COVID-19 declaration under Section 564(b)(1) of the Act, 21 U.S.C.section 360bbb-3(b)(1), unless the authorization is terminated  or revoked sooner.       Influenza A by PCR NEGATIVE NEGATIVE Final   Influenza B by PCR NEGATIVE NEGATIVE Final    Comment: (NOTE) The Xpert Xpress SARS-CoV-2/FLU/RSV plus assay is intended as an aid in the diagnosis of influenza from Nasopharyngeal swab specimens and should not be used as a sole basis for treatment. Nasal washings and aspirates are unacceptable for Xpert Xpress SARS-CoV-2/FLU/RSV testing.  Fact Sheet for Patients: EntrepreneurPulse.com.au  Fact Sheet for Healthcare Providers: IncredibleEmployment.be  This test is not yet approved or cleared by the [place] FDA and has been authorized for detection and/or diagnosis of SARS-CoV-2 by FDA under an Emergency Use Authorization (EUA). This EUA will remain in effect (meaning this test can be used) for the duration of the COVID-19 declaration under Section 564(b)(1) of the Act, 21 U.S.C. section 360bbb-3(b)(1), unless the authorization is terminated or revoked.  Performed at [facility], [address] [place], [place] [address]    Radiology Studies: No results found.  Scheduled Meds:  amLODipine  5 mg Oral Daily   apixaban  2.5 mg Oral BID   vitamin C  500 mg Oral Daily   atorvastatin  80 mg Oral Daily   calcium carbonate  1 tablet Oral BID    carvedilol  6.25 mg Oral BID WC   Chlorhexidine Gluconate Cloth  6 each Topical Q0600   ezetimibe  10 mg Oral Daily   feeding supplement (NEPRO CARB STEADY)  237 mL Oral BID BM   ferrous sulfate  325 mg Oral Q1500   gabapentin  100 mg Oral QHS   insulin aspart  0-5 Units Subcutaneous QHS   insulin aspart  0-9 Units Subcutaneous TID WC   mirtazapine  7.5 mg Oral QHS   multivitamin  1 tablet Oral QHS   Continuous Infusions:  sodium chloride     sodium chloride      LOS: 4 days   Time spent: 44min  Florencia Reasons, MD PhD FACP [facility]  If  7PM-7AM, please contact night-coverage [email or web address]  03/02/2021, 7:25 PM

## 2021-03-02 NOTE — Progress Notes (Signed)
PT Cancellation Note  Patient Details Name: Brianna Dodson MRN: [id number] DOB: 05/15/1934   Cancelled Treatment:    Reason Eval/Treat Not Completed: Other (comment). Pt currently out of room for HD at this time. Not available for therapy at this time. Will re-attempt if able   Gagan Dillion 03/02/2021, 10:03 AM Greggory Stallion, PT, DPT [phone]

## 2021-03-02 NOTE — Progress Notes (Signed)
[facility]  ROUNDING NOTE   Subjective:   Brianna Dodson is a 87 y.o. female with past medical history of atrial fib, anemia, hypertension, hyperlipidemia, diabetes, stroke, and ESRD on dialysis. Patient presents to ED with complaints of left arm pain since previous night.   Patient is known to our practice and receives treatment at [facility], supervised by Dr Juleen China.   Patient seen during dialysis   HEMODIALYSIS FLOWSHEET:  Blood Flow Rate (mL/min): 400 mL/min Arterial Pressure (mmHg): -150 mmHg Venous Pressure (mmHg): 110 mmHg Transmembrane Pressure (mmHg): 70 mmHg Ultrafiltration Rate (mL/min): 170 mL/min Dialysate Flow Rate (mL/min): 500 ml/min Conductivity: Machine : 13.9 Conductivity: Machine : 13.9 Dialysis Fluid Bolus: Normal Saline Bolus Amount (mL): 250 mL  Continues to complain of left arm pain   Objective:  Vital signs in last 24 hours:  Temp:  [97.7 F (36.5 C)-100.5 F (38.1 C)] 97.7 F (36.5 C) (08/31 0940) Pulse Rate:  [53-83] 76 (08/31 1215) Resp:  [11-20] 19 (08/31 1215) BP: (100-144)/(44-71) 122/65 (08/31 1215) SpO2:  [93 %-100 %] 100 % (08/31 0746)  Weight change:  Filed Weights   02/25/21 0816 02/25/21 1756  Weight: 65.3 kg 68.4 kg    Intake/Output: I/O last 3 completed shifts: In: 240 [P.O.:240] Out: -    Intake/Output this shift:  No intake/output data recorded.  Physical Exam: General: NAD, laying on bed  Head: Normocephalic, atraumatic. Moist oral mucosal membranes  Eyes: Anicteric  Lungs:  Clear to auscultation, normal effort  Heart: Regular rate and rhythm  Abdomen:  Soft, nontender  Extremities:  no peripheral edema. LE swelling, fluid filled blisters, gauze dressing  Neurologic: Alert, moving all four extremities  Skin: No lesions  Access: Rt permcath, LUE AVG removed on 83/38/25    Basic Metabolic Panel: Recent Labs  Lab 02/25/21 0841 02/27/21 0530 02/28/21 0617 03/01/21 0544 03/02/21 0453   NA 139 135 133* 135 134*  K 3.6 3.4* 3.9 3.7 3.7  CL 97* 95* 96* 97* 97*  CO2 32 30 28 29 29   GLUCOSE 225* 149* 138* 123* 168*  BUN 27* 25* 38* 26* 35*  CREATININE 3.40* 3.88* 5.67* 4.28* 5.37*  CALCIUM 9.2 8.5* 8.0* 7.8* 8.0*  PHOS  --   --  3.6  --   --      Liver Function Tests: No results for input(s): AST, ALT, ALKPHOS, BILITOT, PROT, ALBUMIN in the last 168 hours. No results for input(s): LIPASE, AMYLASE in the last 168 hours. No results for input(s): AMMONIA in the last 168 hours.  CBC: Recent Labs  Lab 02/25/21 0841 02/27/21 0530 02/28/21 0502 03/01/21 0544 03/02/21 0453  WBC 6.9 5.0 5.4 5.3 4.5  NEUTROABS 5.6  --   --   --   --   HGB 10.5* 11.7* 9.7* 8.6* 8.4*  HCT 29.7* 31.5* 27.6* 23.9* 23.7*  MCV 88.1 87.7 90.8 87.9 88.4  PLT 115* 119* 112* 119* 122*     Cardiac Enzymes: No results for input(s): CKTOTAL, CKMB, CKMBINDEX, TROPONINI in the last 168 hours.  BNP: Invalid input(s): POCBNP  CBG: Recent Labs  Lab 02/28/21 2114 03/01/21 0838 03/01/21 1618 03/01/21 2133 03/02/21 0810  GLUCAP 148* 107* 168* 128* 146*     Microbiology: Results for orders placed or performed during the hospital encounter of 02/25/21  Resp Panel by RT-PCR (Flu A&B, Covid) Nasopharyngeal Swab     Status: None   Collection Time: 02/25/21 12:01 PM   Specimen: Nasopharyngeal Swab; Nasopharyngeal(NP) swabs in vial transport  medium  Result Value Ref Range Status   SARS Coronavirus 2 by RT PCR NEGATIVE NEGATIVE Final    Comment: (NOTE) SARS-CoV-2 target nucleic acids are NOT DETECTED.  The SARS-CoV-2 RNA is generally detectable in upper respiratory specimens during the acute phase of infection. The lowest concentration of SARS-CoV-2 viral copies this assay can detect is 138 copies/mL. A negative result does not preclude SARS-Cov-2 infection and should not be used as the sole basis for treatment or other patient management decisions. A negative result may occur with   improper specimen collection/handling, submission of specimen other than nasopharyngeal swab, presence of viral mutation(s) within the areas targeted by this assay, and inadequate number of viral copies(<138 copies/mL). A negative result must be combined with clinical observations, patient history, and epidemiological information. The expected result is Negative.  Fact Sheet for Patients:  EntrepreneurPulse.com.au  Fact Sheet for Healthcare Providers:  IncredibleEmployment.be  This test is no t yet approved or cleared by the [place] FDA and  has been authorized for detection and/or diagnosis of SARS-CoV-2 by FDA under an Emergency Use Authorization (EUA). This EUA will remain  in effect (meaning this test can be used) for the duration of the COVID-19 declaration under Section 564(b)(1) of the Act, 21 U.S.C.section 360bbb-3(b)(1), unless the authorization is terminated  or revoked sooner.       Influenza A by PCR NEGATIVE NEGATIVE Final   Influenza B by PCR NEGATIVE NEGATIVE Final    Comment: (NOTE) The Xpert Xpress SARS-CoV-2/FLU/RSV plus assay is intended as an aid in the diagnosis of influenza from Nasopharyngeal swab specimens and should not be used as a sole basis for treatment. Nasal washings and aspirates are unacceptable for Xpert Xpress SARS-CoV-2/FLU/RSV testing.  Fact Sheet for Patients: EntrepreneurPulse.com.au  Fact Sheet for Healthcare Providers: IncredibleEmployment.be  This test is not yet approved or cleared by the [place] FDA and has been authorized for detection and/or diagnosis of SARS-CoV-2 by FDA under an Emergency Use Authorization (EUA). This EUA will remain in effect (meaning this test can be used) for the duration of the COVID-19 declaration under Section 564(b)(1) of the Act, 21 U.S.C. section 360bbb-3(b)(1), unless the authorization is terminated  or revoked.  Performed at [facility], [address] [place],  [address]     Coagulation Studies: No results for input(s): LABPROT, INR in the last 72 hours.  Urinalysis: No results for input(s): COLORURINE, LABSPEC, PHURINE, GLUCOSEU, HGBUR, BILIRUBINUR, KETONESUR, PROTEINUR, UROBILINOGEN, NITRITE, LEUKOCYTESUR in the last 72 hours.  Invalid input(s): APPERANCEUR    Imaging: No results found.   Medications:    sodium chloride     sodium chloride      amLODipine  5 mg Oral Daily   apixaban  2.5 mg Oral BID   vitamin C  500 mg Oral Daily   atorvastatin  80 mg Oral Daily   calcium carbonate  1 tablet Oral BID   carvedilol  6.25 mg Oral BID WC   Chlorhexidine Gluconate Cloth  6 each Topical Q0600   ezetimibe  10 mg Oral Daily   feeding supplement (NEPRO CARB STEADY)  237 mL Oral BID BM   ferrous sulfate  325 mg Oral Q1500   gabapentin  100 mg Oral QHS   hydrALAZINE  50 mg Oral BID   insulin aspart  0-5 Units Subcutaneous QHS   insulin aspart  0-9 Units Subcutaneous TID WC   mirtazapine  7.5 mg Oral QHS   multivitamin  1 tablet Oral QHS  sodium chloride, sodium chloride, acetaminophen, alteplase, heparin, lidocaine (PF), lidocaine-prilocaine, liver oil-zinc oxide, morphine injection, pentafluoroprop-tetrafluoroeth, senna-docusate, simethicone  Assessment/ Plan:  [name] is a 87 y.o.  female with past medical history of atrial fib, anemia, hypertension, hyperlipidemia, diabetes, stroke, and ESRD on dialysis. Patient presents to ED with complaints of left arm pain since previous night.   [facility] [address]/MWF/Rt Permcath   1. End stage renal disease on dialysis Will maintain outpatient schedule if possible Receiving dialysis today, No UF due to poor appetite Next treatment scheduled for Friday  2. Anemia of chronic kidney disease Lab Results  Component Value Date   HGB 8.4 (L) 03/02/2021  Hgb below target May consider EPO  with treatments.    3. Secondary Hyperparathyroidism:  Lab Results  Component Value Date   PTH 83 (H) 01/06/2016   CALCIUM 8.0 (L) 03/02/2021   CAION 1.04 (L) 02/24/2021   PHOS 3.6 02/28/2021     Calcium not at goal, but improving.  Calcium carbonate ordered BID   4. Hypertension  Home regimen includes amlodipine, carvedilol, and hydralazine. Current on these medications   LOS: 4   8/31/202212:17 PM

## 2021-03-02 NOTE — Progress Notes (Signed)
Physical Therapy Treatment Patient Details Name: Brianna Dodson MRN: [id number] DOB: 05/20/1934 Today's Date: 03/02/2021    History of Present Illness Pt is an 87 y/o F with PMH: multiple strokes most recent right MCA stroke in March 2022 with residual left-sided weakness, with worsening left-sided weakness after left upper extremity AV fistula surgery on 8/25 (Dr. [name]). Pt presented to ED d/t L arm pain/throbbing starting the night after the procedure. MRI reviewed by neurology and assessment as follows: "although formal radiology read is concerning for acute on chronic infarction it looks like cortical laminar necrosis and not like a new stroke.  She does have left hand grip weakness and left wrist drop which is new from before, but I do not suspect that this is from a new stroke." Pt now s/p ligation of L UE HD cath site to improve perfusion on 02/26/21.    PT Comments    Pt continues to make progress towards therapy goals. Pt demonstrating poor safety awareness and impulsivity today with ambulation to bathroom with RW. Pt educated on usage of SPC however, continued to request usage of RW only. Pt continues to be limited by decreased activity tolerance, decreased B LE strength, and cognition. PT continues to recommend SNF to improve overall functional status and independence with mobility.    Follow Up Recommendations  SNF     Equipment Recommendations  None recommended by PT    Recommendations for Other Services       Precautions / Restrictions Precautions Precautions: Fall Restrictions Weight Bearing Restrictions: No    Mobility  Bed Mobility Overal bed mobility: Needs Assistance Bed Mobility: Sit to Supine;Supine to Sit     Supine to sit: Min assist;HOB elevated Sit to supine: Min assist;HOB elevated   General bed mobility comments: Pt requiring modA x 2 for repositioning in bed after cues for patient to attempt to use bridging techqniue to improve bed position.     Transfers Overall transfer level: Needs assistance Equipment used: Rolling walker (2 wheeled) Transfers: Sit to/from Stand Sit to Stand: Min assist         General transfer comment: Pt requesting usage of RW for ambulation and not usage of SPC. Pt demonstrates poor utilization of RW due to inability to grip L UE.  Ambulation/Gait Ambulation/Gait assistance: Min assist Gait Distance (Feet): 30 Feet Assistive device: Rolling walker (2 wheeled) Gait Pattern/deviations: Trunk flexed;Narrow base of support;Decreased step length - right;Decreased step length - left     General Gait Details: Pt demonstrating impulsivity with ambulation to bathroom with usage of RW. Attempted to educate pt on usage of SPC however, refused to use. Pt requiring verbal cueing for management of RW with usage of 1 UE. Pt demonstrating poor safety awareness during ambulation   Stairs     Wheelchair Mobility    Modified Rankin (Stroke Patients Only)    Balance Overall balance assessment: Needs assistance Sitting-balance support: Feet supported Sitting balance-Leahy Scale: Fair     Standing balance support: Single extremity supported;During functional activity Standing balance-Leahy Scale: Fair        Cognition Arousal/Alertness: Awake/alert Behavior During Therapy: Impulsive;WFL for tasks assessed/performed Overall Cognitive Status: History of cognitive impairments - at baseline            General Comments: Speech continues to be dysarthric however, slightly improved since yesterday      Exercises Other Exercises Other Exercises: Ambulated to bathroom with 1 UE assist on RW. Pt returned to bed and left with pillows under L  UE to assist with swelling and a pillow under distal calf to reduce risk of pressure injury. Family present at end of session    General Comments General comments (skin integrity, edema, etc.): L UE wrapped in gauze over bilsters; Light touch sensation grossly intact in  dorsum of hand      Pertinent Vitals/Pain Pain Assessment: No/denies pain    Home Living        Prior Function        PT Goals (current goals can now be found in the care plan section) Acute Rehab PT Goals Patient Stated Goal: to go home PT Goal Formulation: With patient Time For Goal Achievement: 03/12/21 Potential to Achieve Goals: Good Progress towards PT goals: Progressing toward goals    Frequency    7X/week    PT Plan Current plan remains appropriate    Co-evaluation              AM-PAC PT "6 Clicks" Mobility   Outcome Measure  Help needed turning from your back to your side while in a flat bed without using bedrails?: A Little Help needed moving from lying on your back to sitting on the side of a flat bed without using bedrails?: A Little Help needed moving to and from a bed to a chair (including a wheelchair)?: A Little Help needed standing up from a chair using your arms (e.g., wheelchair or bedside chair)?: A Little Help needed to walk in hospital room?: A Little Help needed climbing 3-5 steps with a railing? : A Lot 6 Click Score: 17    End of Session Equipment Utilized During Treatment: Gait belt Activity Tolerance: Patient tolerated treatment well Patient left: in bed;with call bell/phone within reach;with bed alarm set;with family/visitor present Nurse Communication: Mobility status PT Visit Diagnosis: Unsteadiness on feet (R26.81);Muscle weakness (generalized) (M62.81);History of falling (Z91.81);Difficulty in walking, not elsewhere classified (R26.2)     Time: 4656-8127 PT Time Calculation (min) (ACUTE ONLY): 16 min  Charges:  $Therapeutic Activity: 8-22 mins                    Andrey Campanile, SPT    Andrey Campanile 03/02/2021, 4:49 PM

## 2021-03-02 NOTE — Plan of Care (Signed)
Performed dressing change on L arm with day nurse, Debi Pinkerton.  Blisters are still intact.  Used xeroform, ABD pads and kerlix per order.

## 2021-03-02 NOTE — TOC Progression Note (Signed)
Transition of Care [facility]) - Progression Note    Patient Details  Name: Brianna Dodson MRN: [id number] Date of Birth: 1934/05/05  Transition of Care [facility]) CM/SW [place], RN Phone Number: 03/02/2021, 1:52 PM  Clinical Narrative:   As per care team, patient will discharge tomorrow.  [facility] can transport patient to dialysis as per Dorian Pod.  Dorian Pod aware patient is transporting tomorrow.    Expected Discharge Plan: [place] Barriers to Discharge: Continued Medical Work up  Expected Discharge Plan and Services Expected Discharge Plan: [place]   Discharge Planning Services: CM Consult Post Acute Care Choice: Home Health, Resumption of Svcs/PTA Provider Living arrangements for the past 2 months: Single Family Home                 DME Arranged: N/A DME Agency: NA       HH Arranged: RN, PT, OT, Nurse's Aide [place] Agency: [place] Date HH Agency Contacted: 02/26/21 Time [place]: 1130 Representative spoke with at [place]: [place] ([place]) Interventions    Readmission Risk Interventions Readmission Risk Prevention Plan 03/16/2019  Transportation Screening Complete  PCP or Specialist Appt within 5-7 Days Complete  Home Care Screening Complete  Medication Review (RN CM) Complete  Some recent data might be hidden

## 2021-03-03 DIAGNOSIS — I4891 Unspecified atrial fibrillation: Secondary | ICD-10-CM | POA: Diagnosis not present

## 2021-03-03 DIAGNOSIS — G4733 Obstructive sleep apnea (adult) (pediatric): Secondary | ICD-10-CM | POA: Diagnosis not present

## 2021-03-03 DIAGNOSIS — E119 Type 2 diabetes mellitus without complications: Secondary | ICD-10-CM

## 2021-03-03 DIAGNOSIS — E1140 Type 2 diabetes mellitus with diabetic neuropathy, unspecified: Secondary | ICD-10-CM | POA: Diagnosis not present

## 2021-03-03 DIAGNOSIS — Z23 Encounter for immunization: Secondary | ICD-10-CM | POA: Diagnosis not present

## 2021-03-03 DIAGNOSIS — F0280 Dementia in other diseases classified elsewhere without behavioral disturbance: Secondary | ICD-10-CM | POA: Diagnosis not present

## 2021-03-03 DIAGNOSIS — D631 Anemia in chronic kidney disease: Secondary | ICD-10-CM | POA: Diagnosis not present

## 2021-03-03 DIAGNOSIS — D72819 Decreased white blood cell count, unspecified: Secondary | ICD-10-CM | POA: Diagnosis not present

## 2021-03-03 DIAGNOSIS — T8243XA Leakage of vascular dialysis catheter, initial encounter: Secondary | ICD-10-CM | POA: Insufficient documentation

## 2021-03-03 DIAGNOSIS — Z992 Dependence on renal dialysis: Secondary | ICD-10-CM | POA: Diagnosis not present

## 2021-03-03 DIAGNOSIS — I480 Paroxysmal atrial fibrillation: Secondary | ICD-10-CM

## 2021-03-03 DIAGNOSIS — N183 Chronic kidney disease, stage 3 unspecified: Secondary | ICD-10-CM | POA: Diagnosis not present

## 2021-03-03 DIAGNOSIS — I6330 Cerebral infarction due to thrombosis of unspecified cerebral artery: Secondary | ICD-10-CM | POA: Diagnosis not present

## 2021-03-03 DIAGNOSIS — G4739 Other sleep apnea: Secondary | ICD-10-CM | POA: Diagnosis not present

## 2021-03-03 DIAGNOSIS — G479 Sleep disorder, unspecified: Secondary | ICD-10-CM | POA: Diagnosis not present

## 2021-03-03 DIAGNOSIS — M6281 Muscle weakness (generalized): Secondary | ICD-10-CM | POA: Diagnosis not present

## 2021-03-03 DIAGNOSIS — D509 Iron deficiency anemia, unspecified: Secondary | ICD-10-CM | POA: Diagnosis not present

## 2021-03-03 DIAGNOSIS — I6930 Unspecified sequelae of cerebral infarction: Secondary | ICD-10-CM | POA: Diagnosis not present

## 2021-03-03 DIAGNOSIS — G939 Disorder of brain, unspecified: Secondary | ICD-10-CM | POA: Diagnosis not present

## 2021-03-03 DIAGNOSIS — W19XXXA Unspecified fall, initial encounter: Secondary | ICD-10-CM | POA: Diagnosis not present

## 2021-03-03 DIAGNOSIS — E440 Moderate protein-calorie malnutrition: Secondary | ICD-10-CM | POA: Diagnosis not present

## 2021-03-03 DIAGNOSIS — G309 Alzheimer's disease, unspecified: Secondary | ICD-10-CM | POA: Diagnosis not present

## 2021-03-03 DIAGNOSIS — Z9181 History of falling: Secondary | ICD-10-CM | POA: Diagnosis not present

## 2021-03-03 DIAGNOSIS — E1122 Type 2 diabetes mellitus with diabetic chronic kidney disease: Secondary | ICD-10-CM | POA: Diagnosis not present

## 2021-03-03 DIAGNOSIS — I6529 Occlusion and stenosis of unspecified carotid artery: Secondary | ICD-10-CM | POA: Diagnosis not present

## 2021-03-03 DIAGNOSIS — Z741 Need for assistance with personal care: Secondary | ICD-10-CM | POA: Diagnosis not present

## 2021-03-03 DIAGNOSIS — N186 End stage renal disease: Secondary | ICD-10-CM | POA: Diagnosis not present

## 2021-03-03 DIAGNOSIS — R278 Other lack of coordination: Secondary | ICD-10-CM | POA: Diagnosis not present

## 2021-03-03 DIAGNOSIS — R2681 Unsteadiness on feet: Secondary | ICD-10-CM | POA: Diagnosis not present

## 2021-03-03 DIAGNOSIS — E559 Vitamin D deficiency, unspecified: Secondary | ICD-10-CM | POA: Diagnosis not present

## 2021-03-03 DIAGNOSIS — I639 Cerebral infarction, unspecified: Secondary | ICD-10-CM | POA: Diagnosis not present

## 2021-03-03 DIAGNOSIS — F0150 Vascular dementia without behavioral disturbance: Secondary | ICD-10-CM | POA: Diagnosis not present

## 2021-03-03 DIAGNOSIS — Z743 Need for continuous supervision: Secondary | ICD-10-CM | POA: Diagnosis not present

## 2021-03-03 DIAGNOSIS — R600 Localized edema: Secondary | ICD-10-CM | POA: Diagnosis not present

## 2021-03-03 DIAGNOSIS — Z7902 Long term (current) use of antithrombotics/antiplatelets: Secondary | ICD-10-CM | POA: Diagnosis not present

## 2021-03-03 DIAGNOSIS — M79602 Pain in left arm: Secondary | ICD-10-CM | POA: Diagnosis not present

## 2021-03-03 DIAGNOSIS — E782 Mixed hyperlipidemia: Secondary | ICD-10-CM | POA: Diagnosis not present

## 2021-03-03 DIAGNOSIS — E041 Nontoxic single thyroid nodule: Secondary | ICD-10-CM | POA: Diagnosis not present

## 2021-03-03 DIAGNOSIS — I69354 Hemiplegia and hemiparesis following cerebral infarction affecting left non-dominant side: Secondary | ICD-10-CM | POA: Diagnosis not present

## 2021-03-03 DIAGNOSIS — I120 Hypertensive chronic kidney disease with stage 5 chronic kidney disease or end stage renal disease: Secondary | ICD-10-CM | POA: Diagnosis not present

## 2021-03-03 DIAGNOSIS — Z7984 Long term (current) use of oral hypoglycemic drugs: Secondary | ICD-10-CM | POA: Diagnosis not present

## 2021-03-03 DIAGNOSIS — R404 Transient alteration of awareness: Secondary | ICD-10-CM | POA: Diagnosis not present

## 2021-03-03 DIAGNOSIS — N2581 Secondary hyperparathyroidism of renal origin: Secondary | ICD-10-CM | POA: Diagnosis not present

## 2021-03-03 DIAGNOSIS — I69318 Other symptoms and signs involving cognitive functions following cerebral infarction: Secondary | ICD-10-CM | POA: Diagnosis not present

## 2021-03-03 DIAGNOSIS — I10 Essential (primary) hypertension: Secondary | ICD-10-CM | POA: Diagnosis not present

## 2021-03-03 LAB — BASIC METABOLIC PANEL
Anion gap: 4 — ABNORMAL LOW (ref 5–15)
BUN: 25 mg/dL — ABNORMAL HIGH (ref 8–23)
CO2: 31 mmol/L (ref 22–32)
Calcium: 8.1 mg/dL — ABNORMAL LOW (ref 8.9–10.3)
Chloride: 100 mmol/L (ref 98–111)
Creatinine, Ser: 3.79 mg/dL — ABNORMAL HIGH (ref 0.44–1.00)
GFR, Estimated: 11 mL/min — ABNORMAL LOW (ref >60)
Glucose, Bld: 117 mg/dL — ABNORMAL HIGH (ref 70–99)
Potassium: 3.8 mmol/L (ref 3.5–5.1)
Sodium: 135 mmol/L (ref 135–145)

## 2021-03-03 LAB — CBC
HCT: 23.6 % — ABNORMAL LOW (ref 36.0–46.0)
Hemoglobin: 8.3 g/dL — ABNORMAL LOW (ref 12.0–15.0)
MCH: 31.1 pg (ref 26.0–34.0)
MCHC: 35.2 g/dL (ref 30.0–36.0)
MCV: 88.4 fL (ref 80.0–100.0)
Platelets: 126 10*3/uL — ABNORMAL LOW (ref 150–400)
RBC: 2.67 MIL/uL — ABNORMAL LOW (ref 3.87–5.11)
RDW: 14.4 % (ref 11.5–15.5)
WBC: 4.3 10*3/uL (ref 4.0–10.5)
nRBC: 0 % (ref 0.0–0.2)

## 2021-03-03 LAB — GLUCOSE, CAPILLARY
Glucose-Capillary: 107 mg/dL — ABNORMAL HIGH (ref 70–99)
Glucose-Capillary: 191 mg/dL — ABNORMAL HIGH (ref 70–99)
Glucose-Capillary: 230 mg/dL — ABNORMAL HIGH (ref 70–99)

## 2021-03-03 MED ORDER — ZINC OXIDE 40 % EX OINT: TOPICAL_OINTMENT | CUTANEOUS | 0 refills | Status: DC | PRN

## 2021-03-03 MED ORDER — INSULIN ASPART 100 UNIT/ML IJ SOLN: 0.0000 [IU] | Freq: Three times a day (TID) | INTRAMUSCULAR | 11 refills | Status: DC

## 2021-03-03 MED ORDER — ACETAMINOPHEN 325 MG PO TABS: 650.0000 mg | ORAL_TABLET | Freq: Four times a day (QID) | ORAL | Status: DC | PRN

## 2021-03-03 MED ORDER — GABAPENTIN 100 MG PO CAPS: 100.0000 mg | ORAL_CAPSULE | Freq: Every day | ORAL | Status: DC

## 2021-03-03 MED ORDER — ASCORBIC ACID 500 MG PO TABS: 500.0000 mg | ORAL_TABLET | Freq: Every day | ORAL | Status: DC

## 2021-03-03 MED ORDER — SENNOSIDES-DOCUSATE SODIUM 8.6-50 MG PO TABS: 1.0000 | ORAL_TABLET | Freq: Every evening | ORAL | Status: DC | PRN

## 2021-03-03 MED ORDER — COVID-19 MRNA VAC-TRIS(PFIZER) 30 MCG/0.3ML IM SUSP
0.3000 mL | Freq: Once | INTRAMUSCULAR | Status: AC
  Administered 2021-03-03: 0.3 mL via INTRAMUSCULAR
  Filled 2021-03-03: qty 0.3

## 2021-03-03 MED ORDER — EPOETIN ALFA 4000 UNIT/ML IJ SOLN: 4000.0000 [IU] | INTRAMUSCULAR | Status: DC

## 2021-03-03 MED ORDER — RENA-VITE PO TABS: 1.0000 | ORAL_TABLET | Freq: Every day | ORAL | 0 refills | Status: DC

## 2021-03-03 MED ORDER — NEPRO/CARBSTEADY PO LIQD: 237.0000 mL | Freq: Two times a day (BID) | ORAL | 0 refills | Status: DC

## 2021-03-03 NOTE — Care Management Important Message (Signed)
Important Message  Patient Details  Name: Brianna Dodson MRN: [id number] Date of Birth: 02/20/1934   Medicare Important Message Given:  Yes  I reviewed the Important Message from Medicare with patient but she did not feel like signing.   Juliann Pulse A Krystyl Cannell 03/03/2021, 11:49 AM

## 2021-03-03 NOTE — Progress Notes (Signed)
[facility] Daily Progress Note   02/26/21: Ligation of left upper extremity brachial axillary graft   02/24/21: Left upper arm brachial artery to axillary vein arteriovenous graft  Subjective: Patient without complaint this afternoon. Still unable to move left hand but can feel with it.  Objective: Vitals:   03/03/21 0009 03/03/21 0426 03/03/21 0750 03/03/21 1129  BP: (!) 108/55 108/85 (!) 138/54 119/63  Pulse: 68 66 67 72  Resp: 16 18 18 18   Temp: 98 F (36.7 C) 97.9 F (36.6 C) 98 F (36.7 C) 97.9 F (36.6 C)  TempSrc: Oral Oral Oral Oral  SpO2: 97% 100% 96% 95%  Weight:      Height:        Intake/Output Summary (Last 24 hours) at 03/03/2021 1516 Last data filed at 03/03/2021 1300 Gross per 24 hour  Intake 240 ml  Output --  Net 240 ml   Physical Exam: Has a past medical history of dementia.  Will answer to her name.  Answer some questions appropriately. NAD CV: RRR Pulmonary: CTA Bilaterally Abdomen: Soft, Nontender, Nondistended Vascular:             Left upper extremity.  Arm is soft as it was on Friday.  Palpable radial pulse.  Hand is warm.  Sensation is intact.  Motor function is not.  Scattered blisters on the arm.  Surgical incisions are healing well.   Laboratory: CBC    Component Value Date/Time   WBC 4.3 03/03/2021 0719   HGB 8.3 (L) 03/03/2021 0719   HGB 12.1 11/09/2013 1540   HCT 23.6 (L) 03/03/2021 0719   HCT 36.3 11/09/2013 1540   PLT 126 (L) 03/03/2021 0719   PLT 183 11/09/2013 1540   BMET    Component Value Date/Time   NA 135 03/03/2021 0719   NA 142 11/10/2013 0522   K 3.8 03/03/2021 0719   K 4.6 11/10/2013 0522   CL 100 03/03/2021 0719   CL 110 (H) 11/10/2013 0522   CO2 31 03/03/2021 0719   CO2 27 11/10/2013 0522   GLUCOSE 117 (H) 03/03/2021 0719   GLUCOSE 70 11/10/2013 0522   BUN 25 (H) 03/03/2021 0719   BUN 22 (H) 11/10/2013 0522   CREATININE 3.79 (H) 03/03/2021 0719   CREATININE 1.12 11/10/2013 0522    CALCIUM 8.1 (L) 03/03/2021 0719   CALCIUM 8.4 (L) 11/10/2013 0522   GFRNONAA 11 (L) 03/03/2021 0719   GFRNONAA 47 (L) 11/10/2013 0522   GFRAA 27 (L) 08/22/2019 0449   GFRAA 54 (L) 11/10/2013 0522   Assessment/Planning: The patient is an 87 year old female with known history of end-stage renal disease who initially underwent creation of a brachiocephalic AV fistula to the left upper extremity.  His fistula never matured and a axillobrachial graft was created on Thursday.  Patient returned [facility] the [facility] emergency department Thursday evening complaining of left upper extremity pain.   1) from a vascular surgery standpoint, there are no further recommendations.  We will see her back in our office for new creation work-up.  When she is medical stable she can be discharged home with at least PT/OT services for the loss of motor function to her left hand.   Discussed with Dr. Ellis Parents Verlia Kaney PA-C 03/03/2021 3:16 PM

## 2021-03-03 NOTE — Progress Notes (Signed)
Occupational Therapy Treatment Patient Details Name: Brianna Dodson MRN: [id number] DOB: August 10, 1933 Today's Date: 03/03/2021    History of present illness Pt is an 87 y/o F with PMH: multiple strokes most recent right MCA stroke in March 2022 with residual left-sided weakness, with worsening left-sided weakness after left upper extremity AV fistula surgery on 8/25 (Dr. [name]). Pt presented to ED d/t L arm pain/throbbing starting the night after the procedure. MRI reviewed by neurology and assessment as follows: "although formal radiology read is concerning for acute on chronic infarction it looks like cortical laminar necrosis and not like a new stroke.  She does have left hand grip weakness and left wrist drop which is new from before, but I do not suspect that this is from a new stroke." Pt now s/p ligation of L UE HD cath site to improve perfusion on 02/26/21.   OT comments  Pt in bed upon OT arrival, agreeable to transfer up to chair.  Pt CGA for sit to stand from bed, and remained CGA to maintain dynamic standing for completion of grooming at sink.  OT assisted LUE in supportive positioning on countertop while grooming, otherwise L arm dangled at side.  Pt washed hands and face using R hand, then washed peri area and was set up to apply her own barrier cream to buttocks.  Pt removed her own coccyx bandage/pad against OT recommendation, SN notified.  Pt slightly preoccupied throughout session with inability to actively move L hand.  OT instructed in retrograde massage, self PROM to L wrist and hand, and assisted to elevate L hand on pillows in lap.  Fair return demo of massage technique and self PROM.  Further training needed.  Left pt up in chair with chair alarm on and in place, call light and phone within reach.     Follow Up Recommendations  SNF    Equipment Recommendations  3 in 1 bedside commode;Tub/shower seat    Recommendations for Other Services      Precautions / Restrictions  Precautions Precautions: Fall Restrictions Weight Bearing Restrictions: No       Mobility Bed Mobility Overal bed mobility: Needs Assistance Bed Mobility: Supine to Sit     Supine to sit: HOB elevated;Min assist     General bed mobility comments: min A to transfer trunk supine to sit, able to move legs to EOB without assist Patient Response: Cooperative  Transfers Overall transfer level: Needs assistance Equipment used: Rolling walker (2 wheeled) Transfers: Sit to/from Stand Sit to Stand: Min guard         General transfer comment: Pt holds RW with R hand only, L arm at side d/t inability to grip L hand on walker    Balance Overall balance assessment: Needs assistance Sitting-balance support: Feet supported Sitting balance-Leahy Scale: Fair Sitting balance - Comments: able to maintain static sitting w/o support, but cannot tolerate a challenge   Standing balance support: Single extremity supported;During functional activity Standing balance-Leahy Scale: Fair                             ADL either performed or assessed with clinical judgement   ADL Overall ADL's : Needs assistance/impaired     Grooming: Wash/dry hands;Wash/dry face;Standing;Min guard Grooming Details (indicate cue type and reason): CGA for dynamic standing/grooming at sink     Lower Body Bathing: Min guard Lower Body Bathing Details (indicate cue type and reason): stood at sink to wash peri  area; OT assisted to have L hand supported on coutertop and CGA to maintain standing balance.                     Functional mobility during ADLs: Min guard General ADL Comments: CGA to transfer from bed to standing at sink, CGA sit to stand from chair between grooming tasks     Vision Patient Visual Report: No change from baseline                Cognition Arousal/Alertness: Awake/alert Behavior During Therapy:  (pt preoccupied with wanting to move L hand)                                                         General Comments      Pertinent Vitals/ Pain       Pain Assessment: No/denies pain Pain Score: 0-No pain  Home Living                                                        Frequency  Min 1X/week        Progress Toward Goals  OT Goals(current goals can now be found in the care plan section)  Progress towards OT goals: Progressing toward goals  Acute Rehab OT Goals Patient Stated Goal: to go home OT Goal Formulation: With patient/family Time For Goal Achievement: 03/11/21 Potential to Achieve Goals: [place] Discharge plan remains appropriate;Frequency remains appropriate                     AM-PAC OT "6 Clicks" Daily Activity     Outcome Measure   Help from another person eating meals?: A Little Help from another person taking care of personal grooming?: A Little Help from another person toileting, which includes using toliet, bedpan, or urinal?: A Little Help from another person bathing (including washing, rinsing, drying)?: A Lot Help from another person to put on and taking off regular upper body clothing?: A Lot Help from another person to put on and taking off regular lower body clothing?: A Lot 6 Click Score: 15    End of Session Equipment Utilized During Treatment: Gait belt;Rolling walker  OT Visit Diagnosis: Unsteadiness on feet (R26.81);History of falling (Z91.81)   Activity Tolerance Patient tolerated treatment well   Patient Left in chair;with call bell/phone within reach;Other (comment);with chair alarm set   Nurse Communication Mobility status        Time: [phone] OT Time Calculation (min): 37 min  Charges: OT General Charges $OT Visit: 1 Visit OT Treatments $Self Care/Home Management : 23-37 mins  Leta Speller, MS, OTR/L   Darleene Cleaver 03/03/2021, 11:23 AM

## 2021-03-03 NOTE — TOC Progression Note (Addendum)
Transition of Care [facility]) - Progression Note    Patient Details  Name: Creola Krotz MRN: [id number] Date of Birth: [birth year]  Transition of Care [facility]) CM/SW [place], RN Phone Number: 03/03/2021, 3:10 PM  Clinical Narrative:   Patient to discharge to [facility] today at 1630 by EMS.  Dorian Pod at [facility] confirms patient will go to bed 506A  Patient's sister aware of transfer this afternoon.  Expected Discharge Plan: [place] Barriers to Discharge: Continued Medical Work up  Expected Discharge Plan and Services Expected Discharge Plan: [place]   Discharge Planning Services: CM Consult Post Acute Care Choice: [place], Resumption of Svcs/PTA Provider Living arrangements for the past 2 months: Single Family Home Expected Discharge Date: 03/03/21               DME Arranged: N/A DME Agency: NA       HH Arranged: RN, PT, OT, Nurse's Aide [place] Agency: [place] Date HH Agency Contacted: 02/26/21 Time [place]: 1130 Representative spoke with at [place]: [place] ([place]) Interventions    Readmission Risk Interventions Readmission Risk Prevention Plan 03/03/2021 03/16/2019  Transportation Screening Complete Complete  PCP or Specialist Appt within 5-7 Days - Complete  PCP or Specialist Appt within 3-5 Days Complete -  Home Care Screening - Complete  Medication Review (RN CM) - Complete  HRI or Home Care Consult Complete -  Social Work Consult for [place] Planning/Counseling Not Complete -  SW consult not completed comments RNCM assigned to patient -  Palliative Care Screening Not Applicable -  Medication Review Press photographer) Complete -  Some recent data might be hidden

## 2021-03-03 NOTE — Plan of Care (Signed)
  Problem: Education: Goal: Knowledge of General Education information will improve Description: Including pain rating scale, medication(s)/side effects and non-pharmacologic comfort measures Outcome: Progressing   Problem: Health Behavior/Discharge Planning: Goal: Ability to manage health-related needs will improve Outcome: Progressing   Problem: Clinical Measurements: Goal: Ability to maintain clinical measurements within normal limits will improve Outcome: Progressing Goal: Will remain free from infection Outcome: Progressing Goal: Diagnostic test results will improve Outcome: Progressing Goal: Respiratory complications will improve Outcome: Progressing Goal: Cardiovascular complication will be avoided Outcome: Progressing   Problem: Activity: Goal: Risk for activity intolerance will decrease Outcome: Progressing   Problem: Nutrition: Goal: Adequate nutrition will be maintained Outcome: Progressing   Problem: Coping: Goal: Level of anxiety will decrease Outcome: Progressing   Problem: Elimination: Goal: Will not experience complications related to bowel motility Outcome: Progressing Goal: Will not experience complications related to urinary retention Outcome: Progressing   Problem: Pain Managment: Goal: General experience of comfort will improve Outcome: Progressing   Problem: Safety: Goal: Ability to remain free from injury will improve Outcome: Progressing   Problem: Skin Integrity: Goal: Risk for impaired skin integrity will decrease Outcome: Progressing   Problem: Education: Goal: Knowledge of disease or condition will improve Outcome: Progressing Goal: Knowledge of secondary prevention will improve Outcome: Progressing Goal: Knowledge of patient specific risk factors addressed and post discharge goals established will improve Outcome: Progressing   Problem: Coping: Goal: Will identify appropriate support needs Outcome: Progressing   Problem:  Health Behavior/Discharge Planning: Goal: Ability to manage health-related needs will improve Outcome: Progressing   Problem: Self-Care: Goal: Ability to participate in self-care as condition permits will improve Outcome: Progressing Goal: Ability to communicate needs accurately will improve Outcome: Progressing   Problem: Nutrition: Goal: Risk of aspiration will decrease Outcome: Progressing   Problem: Ischemic Stroke/TIA Tissue Perfusion: Goal: Complications of ischemic stroke/TIA will be minimized Outcome: Progressing

## 2021-03-03 NOTE — Progress Notes (Signed)
PT Cancellation Note  Patient Details Name: Brianna Dodson MRN: [id number] DOB: 1933-07-19   Cancelled Treatment:    Reason Eval/Treat Not Completed: Other (comment). Pt currently working with OT, will re-attempt, time permitting.   Brianna Dodson 03/03/2021, 10:40 AM Greggory Stallion, PT, DPT [phone]

## 2021-03-03 NOTE — TOC Progression Note (Signed)
Transition of Care [facility]) - Progression Note    Patient Details  Name: Brianna Dodson MRN: [id number] Date of Birth: 1934-04-06  Transition of Care [facility]) CM/SW [place], RN Phone Number: 03/03/2021, 12:58 PM  Clinical Narrative:    Patient is discharging home today.  She will go to [facility] this afternoon, after she gets COVID booster, Dorian Pod at [facility] aware.  [facility] to [facility]. EMS will transport patient at 430 pm.  Care team aware.   Expected Discharge Plan: [place] Barriers to Discharge: Continued Medical Work up  Expected Discharge Plan and Services Expected Discharge Plan: [place]   Discharge Planning Services: CM Consult Post Acute Care Choice: [place], Resumption of Svcs/PTA Provider Living arrangements for the past 2 months: Single Family Home                 DME Arranged: N/A DME Agency: NA       HH Arranged: RN, PT, OT, Nurse's Aide [place] Agency: [place] Date HH Agency Contacted: 02/26/21 Time [place]: 1130 Representative spoke with at [place]: [place] ([place]) Interventions    Readmission Risk Interventions Readmission Risk Prevention Plan 03/03/2021 03/16/2019  Transportation Screening Complete Complete  PCP or Specialist Appt within 5-7 Days - Complete  PCP or Specialist Appt within 3-5 Days Complete -  Home Care Screening - Complete  Medication Review (RN CM) - Complete  HRI or Home Care Consult Complete -  Social Work Consult for [place] Planning/Counseling Not Complete -  SW consult not completed comments RNCM assigned to patient -  Palliative Care Screening Not Applicable -  Medication Review Press photographer) Complete -  Some recent data might be hidden

## 2021-03-03 NOTE — Progress Notes (Signed)
[facility]  ROUNDING NOTE   Subjective:   Brianna Dodson is a 87 y.o. female with past medical history of atrial fib, anemia, hypertension, hyperlipidemia, diabetes, stroke, and ESRD on dialysis. Patient presents to ED with complaints of left arm pain since previous night.   Patient is known to our practice and receives treatment at [facility], supervised by Dr Juleen China.   Patient seen resting in bed Breakfast tray at bedside, ate half of meal Denies nausea and vomiting Continues to complain of left arm pain, inability to move fingers Patient seen later sitting on side of bed, working with OT   Objective:  Vital signs in last 24 hours:  Temp:  [97.8 F (36.6 C)-100.5 F (38.1 C)] 97.9 F (36.6 C) (09/01 1129) Pulse Rate:  [66-79] 72 (09/01 1129) Resp:  [14-22] 18 (09/01 1129) BP: (85-138)/(45-86) 119/63 (09/01 1129) SpO2:  [95 %-100 %] 95 % (09/01 1129)  Weight change:  Filed Weights   02/25/21 0816 02/25/21 1756  Weight: 65.3 kg 68.4 kg    Intake/Output: No intake/output data recorded.   Intake/Output this shift:  Total I/O In: 240 [P.O.:240] Out: -   Physical Exam: General: NAD  Head: Normocephalic, atraumatic. Moist oral mucosal membranes  Eyes: Anicteric  Lungs:  Clear to auscultation, normal effort  Heart: Regular rate and rhythm  Abdomen:  Soft, nontender  Extremities:  no peripheral edema. LE swelling, fluid filled blisters, gauze dressing  Neurologic: Alert, moving all four extremities  Skin: No lesions  Access: Rt permcath, LUE AVG removed on 65/46/50    Basic Metabolic Panel: Recent Labs  Lab 02/27/21 0530 02/28/21 0617 03/01/21 0544 03/02/21 0453 03/03/21 0719  NA 135 133* 135 134* 135  K 3.4* 3.9 3.7 3.7 3.8  CL 95* 96* 97* 97* 100  CO2 30 28 29 29 31   GLUCOSE 149* 138* 123* 168* 117*  BUN 25* 38* 26* 35* 25*  CREATININE 3.88* 5.67* 4.28* 5.37* 3.79*  CALCIUM 8.5* 8.0* 7.8* 8.0* 8.1*  PHOS  --  3.6  --   --   --       Liver Function Tests: No results for input(s): AST, ALT, ALKPHOS, BILITOT, PROT, ALBUMIN in the last 168 hours. No results for input(s): LIPASE, AMYLASE in the last 168 hours. No results for input(s): AMMONIA in the last 168 hours.  CBC: Recent Labs  Lab 02/25/21 0841 02/27/21 0530 02/28/21 0502 03/01/21 0544 03/02/21 0453 03/03/21 0719  WBC 6.9 5.0 5.4 5.3 4.5 4.3  NEUTROABS 5.6  --   --   --   --   --   HGB 10.5* 11.7* 9.7* 8.6* 8.4* 8.3*  HCT 29.7* 31.5* 27.6* 23.9* 23.7* 23.6*  MCV 88.1 87.7 90.8 87.9 88.4 88.4  PLT 115* 119* 112* 119* 122* 126*     Cardiac Enzymes: No results for input(s): CKTOTAL, CKMB, CKMBINDEX, TROPONINI in the last 168 hours.  BNP: Invalid input(s): POCBNP  CBG: Recent Labs  Lab 03/02/21 1341 03/02/21 1736 03/02/21 2024 03/03/21 0751 03/03/21 1129  GLUCAP 172* 186* 142* 107* 191*     Microbiology: Results for orders placed or performed during the hospital encounter of 02/25/21  Resp Panel by RT-PCR (Flu A&B, Covid) Nasopharyngeal Swab     Status: None   Collection Time: 02/25/21 12:01 PM   Specimen: Nasopharyngeal Swab; Nasopharyngeal(NP) swabs in vial transport medium  Result Value Ref Range Status   SARS Coronavirus 2 by RT PCR NEGATIVE NEGATIVE Final    Comment: (NOTE) SARS-CoV-2  target nucleic acids are NOT DETECTED.  The SARS-CoV-2 RNA is generally detectable in upper respiratory specimens during the acute phase of infection. The lowest concentration of SARS-CoV-2 viral copies this assay can detect is 138 copies/mL. A negative result does not preclude SARS-Cov-2 infection and should not be used as the sole basis for treatment or other patient management decisions. A negative result may occur with  improper specimen collection/handling, submission of specimen other than nasopharyngeal swab, presence of viral mutation(s) within the areas targeted by this assay, and inadequate number of viral copies(<138 copies/mL). A  negative result must be combined with clinical observations, patient history, and epidemiological information. The expected result is Negative.  Fact Sheet for Patients:  EntrepreneurPulse.com.au  Fact Sheet for Healthcare Providers:  IncredibleEmployment.be  This test is no t yet approved or cleared by the [place] FDA and  has been authorized for detection and/or diagnosis of SARS-CoV-2 by FDA under an Emergency Use Authorization (EUA). This EUA will remain  in effect (meaning this test can be used) for the duration of the COVID-19 declaration under Section 564(b)(1) of the Act, 21 U.S.C.section 360bbb-3(b)(1), unless the authorization is terminated  or revoked sooner.       Influenza A by PCR NEGATIVE NEGATIVE Final   Influenza B by PCR NEGATIVE NEGATIVE Final    Comment: (NOTE) The Xpert Xpress SARS-CoV-2/FLU/RSV plus assay is intended as an aid in the diagnosis of influenza from Nasopharyngeal swab specimens and should not be used as a sole basis for treatment. Nasal washings and aspirates are unacceptable for Xpert Xpress SARS-CoV-2/FLU/RSV testing.  Fact Sheet for Patients: EntrepreneurPulse.com.au  Fact Sheet for Healthcare Providers: IncredibleEmployment.be  This test is not yet approved or cleared by the [place] FDA and has been authorized for detection and/or diagnosis of SARS-CoV-2 by FDA under an Emergency Use Authorization (EUA). This EUA will remain in effect (meaning this test can be used) for the duration of the COVID-19 declaration under Section 564(b)(1) of the Act, 21 U.S.C. section 360bbb-3(b)(1), unless the authorization is terminated or revoked.  Performed at [facility], [address] [place], [place] [address]   CULTURE, BLOOD (ROUTINE X 2) w Reflex to ID Panel     Status: None (Preliminary result)   Collection Time: 03/02/21  6:30 PM   Specimen: BLOOD   Result Value Ref Range Status   Specimen Description BLOOD BLOOD RIGHT WRIST  Final   Special Requests   Final    BOTTLES DRAWN AEROBIC AND ANAEROBIC Blood Culture adequate volume   Culture   Final    NO GROWTH < 24 HOURS Performed at [facility], [address] [place], [place] [address]    Report Status PENDING  Incomplete  Resp Panel by RT-PCR (Flu A&B, Covid) Nasopharyngeal Swab     Status: None   Collection Time: 03/02/21  6:35 PM   Specimen: Nasopharyngeal Swab; Nasopharyngeal(NP) swabs in vial transport medium  Result Value Ref Range Status   SARS Coronavirus 2 by RT PCR NEGATIVE NEGATIVE Final    Comment: (NOTE) SARS-CoV-2 target nucleic acids are NOT DETECTED.  The SARS-CoV-2 RNA is generally detectable in upper respiratory specimens during the acute phase of infection. The lowest concentration of SARS-CoV-2 viral copies this assay can detect is 138 copies/mL. A negative result does not preclude SARS-Cov-2 infection and should not be used as the sole basis for treatment or other patient management decisions. A negative result may occur with  improper specimen collection/handling, submission of specimen  other than nasopharyngeal swab, presence of viral mutation(s) within the areas targeted by this assay, and inadequate number of viral copies(<138 copies/mL). A negative result must be combined with clinical observations, patient history, and epidemiological information. The expected result is Negative.  Fact Sheet for Patients:  EntrepreneurPulse.com.au  Fact Sheet for Healthcare Providers:  IncredibleEmployment.be  This test is no t yet approved or cleared by the [place] FDA and  has been authorized for detection and/or diagnosis of SARS-CoV-2 by FDA under an Emergency Use Authorization (EUA). This EUA will remain  in effect (meaning this test can be used) for the duration of the COVID-19 declaration under Section  564(b)(1) of the Act, 21 U.S.C.section 360bbb-3(b)(1), unless the authorization is terminated  or revoked sooner.       Influenza A by PCR NEGATIVE NEGATIVE Final   Influenza B by PCR NEGATIVE NEGATIVE Final    Comment: (NOTE) The Xpert Xpress SARS-CoV-2/FLU/RSV plus assay is intended as an aid in the diagnosis of influenza from Nasopharyngeal swab specimens and should not be used as a sole basis for treatment. Nasal washings and aspirates are unacceptable for Xpert Xpress SARS-CoV-2/FLU/RSV testing.  Fact Sheet for Patients: EntrepreneurPulse.com.au  Fact Sheet for Healthcare Providers: IncredibleEmployment.be  This test is not yet approved or cleared by the [place] FDA and has been authorized for detection and/or diagnosis of SARS-CoV-2 by FDA under an Emergency Use Authorization (EUA). This EUA will remain in effect (meaning this test can be used) for the duration of the COVID-19 declaration under Section 564(b)(1) of the Act, 21 U.S.C. section 360bbb-3(b)(1), unless the authorization is terminated or revoked.  Performed at [facility], [address] [place], [place] [address]   CULTURE, BLOOD (ROUTINE X 2) w Reflex to ID Panel     Status: None (Preliminary result)   Collection Time: 03/02/21  6:40 PM   Specimen: BLOOD  Result Value Ref Range Status   Specimen Description BLOOD BLOOD RIGHT HAND  Final   Special Requests   Final    BOTTLES DRAWN AEROBIC AND ANAEROBIC Blood Culture results may not be optimal due to an inadequate volume of blood received in culture bottles   Culture   Final    NO GROWTH < 24 HOURS Performed at [facility], [address] [place], [place] [address]    Report Status PENDING  Incomplete    Coagulation Studies: No results for input(s): LABPROT, INR in the last 72 hours.  Urinalysis: No results for input(s): COLORURINE, LABSPEC, PHURINE, GLUCOSEU, HGBUR, BILIRUBINUR,  KETONESUR, PROTEINUR, UROBILINOGEN, NITRITE, LEUKOCYTESUR in the last 72 hours.  Invalid input(s): APPERANCEUR    Imaging: No results found.   Medications:    sodium chloride     sodium chloride      amLODipine  5 mg Oral Daily   apixaban  2.5 mg Oral BID   vitamin C  500 mg Oral Daily   atorvastatin  80 mg Oral Daily   calcium carbonate  1 tablet Oral BID   carvedilol  6.25 mg Oral BID WC   Chlorhexidine Gluconate Cloth  6 each Topical Q0600   COVID-19 mRNA Vac-TriS (Pfizer)  0.3 mL Intramuscular Once   ezetimibe  10 mg Oral Daily   feeding supplement (NEPRO CARB STEADY)  237 mL Oral BID BM   ferrous sulfate  325 mg Oral Q1500   gabapentin  100 mg Oral QHS   insulin aspart  0-5 Units Subcutaneous QHS   insulin aspart  0-9 Units  Subcutaneous TID WC   mirtazapine  7.5 mg Oral QHS   multivitamin  1 tablet Oral QHS   sodium chloride, sodium chloride, acetaminophen, alteplase, heparin, lidocaine (PF), lidocaine-prilocaine, liver oil-zinc oxide, morphine injection, pentafluoroprop-tetrafluoroeth, senna-docusate, simethicone  Assessment/ Plan:  Brianna Dodson is a 87 y.o.  female with past medical history of atrial fib, anemia, hypertension, hyperlipidemia, diabetes, stroke, and ESRD on dialysis. Patient presents to ED with complaints of left arm pain since previous night.   [facility] [address]/MWF/Rt Permcath   1. End stage renal disease on dialysis Will maintain outpatient schedule if possible Next treatment scheduled for Friday  2. Anemia of chronic kidney disease Lab Results  Component Value Date   HGB 8.3 (L) 03/03/2021  Hgb below target Low dose EPO with treatments.    3. Secondary Hyperparathyroidism:  Lab Results  Component Value Date   PTH 83 (H) 01/06/2016   CALCIUM 8.1 (L) 03/03/2021   CAION 1.04 (L) 02/24/2021   PHOS 3.6 02/28/2021     Calcium below target, but improving with calcium carbonate Calcium carbonate ordered BID   4. Hypertension   Home regimen includes amlodipine, carvedilol, and hydralazine. Currently on these medications. BP stable   LOS: 5   9/1/202211:59 AM

## 2021-03-03 NOTE — Discharge Summary (Addendum)
Discharge Summary  Brianna Dodson QTM:[id number] DOB: 09-25-1933  PCP: [name], MD  Admit date: 02/25/2021 Discharge date: 03/03/2021  Time spent: 53mins, more than 50% time spent on coordination of care. Sister updated over  the phone  Recommendations for Outpatient Follow-up:  F/u with SNF MD  for hospital discharge follow up, repeat cbc/bmp at follow up F/u with vascular surgery Dr [name] in two weeks for left hand  Continue dialysis Monday Wednesday Friday Follow-up with neurology in 4 to 6 weeks for stroke follow-up Recommend palliative care to follow patient at [facility]    Discharge Diagnoses:  Active Hospital Problems   Diagnosis Date Noted   Stroke [facility]) 02/25/2021   Renal dialysis device, implant, or graft complication    Left arm pain 02/25/2021   Thrombocytopenia ([place]) 02/25/2021   End stage renal disease on dialysis ([place]) 10/12/2020   Atrial fibrillation ([place]) 10/21/2019   Anemia in ESRD (end-stage renal disease) ([place]) 04/18/2019   Type II diabetes mellitus with renal manifestations ([place]) 01/29/2014   Essential hypertension 01/13/2014   HLD (hyperlipidemia) 01/13/2014    Resolved Hospital Problems  No resolved problems to display.    Discharge Condition: stable  Diet recommendation: Renal/carb modified  Filed Weights   02/25/21 0816 02/25/21 1756  Weight: 65.3 kg 68.4 kg    History of present illness: (Per admitting MD Dr Blaine Hamper) Patient coming from:  The patient is coming from home.  At baseline, pt is independent for most of ADL.         Chief Complaint: left arm pain and weakness   HPI: Brianna Dodson is a 87 y.o. female with medical history significant of ESRD-HD (MWF), hypertension, hyperlipidemia, diabetes mellitus,'s embolic stroke, atrial fibrillation on Eliquis, depression, OSA, HCV, anemia, Alzheimer's disease, who presents with left arm pain and weakness.   Pt had left arm AV graft placement procedure yesterday by Dr. [name] of vascular surgery.   After she went home, she developed severe pain in the left arm, which is constant, sharp, throbbing-like, nonradiating.  Patient also reports weakness in left forearm, particularly in the left wrist and hand.  No decreased sensation.  No facial droop or slurred speech.  She denies chest pain, cough, shortness of breath.  Patient has nausea, but no vomiting, diarrhea or abdominal pain.  She states that she stopped taking Eliquis from 8/22-8/25 for surgical procedure.  She has not restarted Eliquis yet.  Patient last dialysis was on Wednesday, due for dialysis today.   ED Course: pt was found to have WBC 6.9, pending COVID-19 PCR, potassium 3.6, bicarbonate 32, creatinine 3.40, BUN 27, temperature normal, blood pressure 172/69, 151/77, heart rate 56, 87, RR 23, oxygen saturation 97% on room air.  CT head is negative for acute intracranial abnormalities.  MRI showed possible new stroke. Pt is placed in med-surg bed for obs. Dr. [name] of VVS, Dr. Juleen China of renal, Dr. Rory Percy of neuro are consulted.  Hospital Course:  Principal Problem:   Stroke [facility]) Active Problems:   Essential hypertension   HLD (hyperlipidemia)   Type II diabetes mellitus with renal manifestations (HCC)   Anemia in ESRD (end-stage renal disease) (HCC)   Atrial fibrillation (HCC)   End stage renal disease on dialysis (HCC)   Left arm pain   Thrombocytopenia (HCC)   Renal dialysis device, implant, or graft complication  Acute right parietal lobe infarct, POA - Neurology and Vascular following - appreciate insight and recs - Continue neuro checks, defer repeat imaging per consults -  OK to continue Eliquis per vascular - A1C 6.8 - Lipid panel WNL - PT/OT consult - SNF -Neurology follow-up in 4 to 6 weeks   Left arm pain/weakness after AV graft placement:  - Vascular surgery following, appreciate insight and recommendations  - Neurology - unlikely acute CVA is related to weakness/deficits - Concern for steal syndrome given  imaging -this post ligation of the AV graft - L arm continues to be flaccid distal to the proximal upper arm but sensation improving distally - continue PT/OT - recs for SNF currently -Follow-up with vascular in 2 weeks   Acute hydrostatic bullae of LUE Acute edema secondary to above - Continue to elevate, compression wrap today to hopefully improve edema - Defer to vascular for any further imaging intervention or procedure per their expertise, it appeared she had another follow-up imaging scheduled on September 7   Essential hypertension -Blood pressure stable on - Amlodipine, Coreg   ESRD-HD: - Nephrology consulted for dialysis, Monday Wednesday Friday   HLD (hyperlipidemia) - Lipitor and Zetia   Type II diabetes mellitus with renal manifestations ([place]):  - Recent A1c 6.8, borderline controlled.  Patient is taking Tradjenta - Sliding scale insulin   Anemia in ESRD (end-stage renal disease) ([place]):  - Hemoglobin stable - Continue iron supplement   Atrial fibrillation (HCC)/PAF - Coreg - Continue Eliquis; per vascular - okay to resume     Thrombocytopenia ([place]):  - This is chronic issue.  Stable in 110s-120s - No bleeding tendency. - Follow-up with CBC   Fever Fever 100.5 x1 on 8/31 pm  blood culture no growth Incentive spirometer  No more fever, she does not appear septic    Code Status: Partial, DO NOT INTUBATE  Consultants:  Nephrology, neuro, vascular surgery   Procedures: LIGATION OF ARTERIOVENOUS  FISTULA (Left) on 8/27  Discharge Exam: BP 119/63 (BP Location: Right Arm)   Pulse 72   Temp 97.9 F (36.6 C) (Oral)   Resp 18   Ht 5\' 5"  (1.651 m)   Wt 68.4 kg   SpO2 95%   BMI 25.09 kg/m   General: NAD, aaox3, but poor historian with some dysarthria Cardiovascular: rrr Respiratory: normal respiratory effort  Discharge Instructions You were cared for by a hospitalist during your hospital stay. If you have any questions about your discharge medications  or the care you received while you were in the hospital after you are discharged, you can call the unit and asked to speak with the hospitalist on call if the hospitalist that took care of you is not available. Once you are discharged, your primary care physician will handle any further medical issues. Please note that NO REFILLS for any discharge medications will be authorized once you are discharged, as it is imperative that you return to your primary care physician (or establish a relationship with a primary care physician if you do not have one) for your aftercare needs so that they can reassess your need for medications and monitor your lab values.  Discharge Instructions     Diet general   Complete by: As directed    Renal /carb modified   Discharge wound care:   Complete by: As directed    Wound care daily  Xeroform to blisters, then apply ABD and Kerlix   Increase activity slowly   Complete by: As directed       Allergies as of 03/03/2021       Reactions   Nsaids    CKD stage III -  Avoid all nephrotoxic drugs        Medication List     STOP taking these medications    hydrALAZINE 50 MG tablet Commonly known as: APRESOLINE   HYDROcodone-acetaminophen 5-325 MG tablet Commonly known as: Norco       TAKE these medications    Accu-Chek Guide test strip Generic drug: glucose blood USE TO CHECK BLOOD SUGARS TWICE DAILY. E11.9   acetaminophen 325 MG tablet Commonly known as: TYLENOL Take 2 tablets (650 mg total) by mouth every 6 (six) hours as needed for mild pain, fever or moderate pain.   amLODipine 10 MG tablet Commonly known as: NORVASC Take 0.5 tablets (5 mg total) by mouth daily.   ascorbic acid 500 MG tablet Commonly known as: VITAMIN C Take 1 tablet (500 mg total) by mouth daily. Start taking on: March 04, 2021   atorvastatin 80 MG tablet Commonly known as: LIPITOR Take 1 tablet (80 mg total) by mouth daily.   carvedilol 6.25 MG tablet Commonly  known as: COREG TAKE 1 TABLET (6.25 MG TOTAL) BY MOUTH 2 (TWO) TIMES DAILY WITH A MEAL.   Eliquis 2.5 MG Tabs tablet Generic drug: apixaban Take 1 tablet (2.5 mg total) by mouth 2 (two) times daily.   ezetimibe 10 MG tablet Commonly known as: ZETIA TAKE 1 TABLET BY MOUTH EVERY DAY   feeding supplement (NEPRO CARB STEADY) Liqd Take 237 mLs by mouth 2 (two) times daily between meals. Start taking on: March 04, 2021   gabapentin 100 MG capsule Commonly known as: NEURONTIN Take 1 capsule (100 mg total) by mouth at bedtime.   insulin aspart 100 UNIT/ML injection Commonly known as: novoLOG Inject 0-9 Units into the skin 3 (three) times daily with meals. Before each meal 3 times a day, 140-199 - 2 units, 200-250 - 4 units, 251-299 - 6 units,  300-349 - 8 units,  350 or above 10 units. Insulin PEN if approved, provide syringes and needles if needed.   Iron 28 MG Tabs Take 1 tablet by mouth daily in the afternoon.   linagliptin 5 MG Tabs tablet Commonly known as: Tradjenta Take 1 tablet (5 mg total) by mouth daily.   liver oil-zinc oxide 40 % ointment Commonly known as: DESITIN Apply topically as needed for irritation.   mirtazapine 7.5 MG tablet Commonly known as: REMERON Take 7.5 mg by mouth at bedtime.   multivitamin Tabs tablet Take 1 tablet by mouth at bedtime.   onetouch ultrasoft lancets Use as instructed. Use patient insurance preferred brand.   senna-docusate 8.6-50 MG tablet Commonly known as: Senokot-S Take 1 tablet by mouth at bedtime as needed for mild constipation.   Vitamin D (Ergocalciferol) 1.25 MG (50000 UNIT) Caps capsule Commonly known as: DRISDOL Take 50,000 Units by mouth every 30 (thirty) days.               Discharge Care Instructions  (From admission, onward)           Start     Ordered   03/03/21 0000  Discharge wound care:       Comments: Wound care daily  Xeroform to blisters, then apply ABD and Kerlix   03/03/21 1436            Allergies  Allergen Reactions   Nsaids     CKD stage III - Avoid all nephrotoxic drugs    Contact information for follow-up providers     Algernon Huxley, MD Follow up in 2 week(s).  Specialties: Vascular Surgery, Radiology, Interventional Cardiology Why: Can see Dew or Arna Medici. First post-op check. Bilateral upper extremity vein mapping. Contact information: [place] [address] [phone]              Contact information for after-discharge care     Destination     [facility] Preferred SNF .   Service: Skilled Nursing Contact information: [address] [place] [phone]                      The results of significant diagnostics from this hospitalization (including imaging, microbiology, ancillary and laboratory) are listed below for reference.    Significant Diagnostic Studies: CT HEAD WO CONTRAST (5MM)  Result Date: 02/25/2021 CLINICAL DATA:  87 year old female with dizziness. Acute on chronic right MCA infarct in March. Dialysis patient with pain in the region of left arm AV fistula. EXAM: CT HEAD WITHOUT CONTRAST TECHNIQUE: Contiguous axial images were obtained from the base of the skull through the vertex without intravenous contrast. COMPARISON:  Brain MRI 09/12/2020.  Head CT 09/10/2020. FINDINGS: Brain: Right MCA territory encephalomalacia. Mild ex vacuo enlargement of the ventricles. Patchy and confluent additional bilateral cerebral white matter hypodensity. No midline shift, ventriculomegaly, mass effect, evidence of mass lesion, intracranial hemorrhage or evidence of cortically based acute infarction. Vascular: Extensive Calcified atherosclerosis at the skull base. Right MCA calcified atherosclerosis. No suspicious intracranial vascular hyperdensity. Skull: No acute osseous abnormality identified. Sinuses/Orbits: Continued bubbly opacity in the right sphenoid sinus. Other Visualized  paranasal sinuses and mastoids are clear. Other: No acute orbit or scalp soft tissue finding. IMPRESSION: 1. Chronic right MCA territory infarct and advanced chronic small vessel disease. 2. No acute intracranial abnormality. Electronically Signed   By: Genevie Ann M.D.   On: 02/25/2021 10:16   CT ANGIO UP EXTREM LEFT W &/OR WO CONTAST  Result Date: 02/25/2021 CLINICAL DATA:  87 year old female with a murmur at the site of AV fistula EXAM: CT ANGIOGRAPHY UPPER LEFT EXTREMITY TECHNIQUE: Axial spiral CT images were acquired of the left upper extremity after administration of a standard IV contrast bolus. The images were acquired with timing specific for the arterial system. Axial and coronal images were performed on a separate workstation CONTRAST:  45mL OMNIPAQUE IOHEXOL 350 MG/ML SOLN COMPARISON:  None. FINDINGS: VASCULAR: Axillary artery is patent with mild atherosclerosis and no high-grade stenosis or occlusion. The humeral circumflex branches remain patent. Brachial artery is patent without significant atherosclerosis throughout the upper arm. Surgical changes of arteriovenous fistula/dialysis circuit of the upper arm. The anastomosis is patent, with patent venous outflow through the brachial vein, axillary vein, and distal subclavian vein. Distal to the anastomosis the brachial artery remains patent to the antecubital region. Radial artery and the ulnar artery are patent to the distal forearm. There is decreased attenuation distally, which may be secondary to late arrival of the contrast bolus. The radial artery is dominant compared to the ulnar artery. A small interosseous artery is present proximally, without opacification distally. Incidentally imaged left common femoral artery appears patent as well as the proximal profunda femoris and SFA. The pelvis is not imaged. Nonvascular: Gas within the soft tissues of the left upper arm adjacent to the fistula. No pooling of contrast or extravasation of contrast.  Edema within the fat planes adjacent to the musculature of the upper arm without fluid collection. Degenerative changes of the glenohumeral joint, acromioclavicular joint. No acute displaced fracture. Review of the MIP  images confirms the above findings. IMPRESSION: CT angiogram demonstrates a patent left upper extremity arteriovenous dialysis circuit. If there is concern for hemodynamic assessment, directed duplex is a more sensitive and specific test, and should be considered if there is a suspicion of flow abnormality. Gas and edema within the soft tissues of the left upper arm adjacent to the dialysis circuit. While this is presumably related to usage/cannulation, soft tissue infection/cellulitis can not be excluded on CT. Signed, Dulcy Fanny. Dellia Nims, RPVI [facility] Radiology Electronically Signed   By: Corrie Mckusick D.O.   On: 02/25/2021 10:17   MR ANGIO HEAD WO CONTRAST  Result Date: 02/25/2021 CLINICAL DATA:  Follow-up examination for acute stroke. EXAM: MRA HEAD WITHOUT CONTRAST TECHNIQUE: Angiographic images of the Circle of Willis were acquired using MRA technique without intravenous contrast. COMPARISON:  Prior MRI from earlier the same day. FINDINGS: Anterior circulation: Examination moderately degraded by motion artifact. Visualized distal cervical segments of the internal carotid arteries are patent with antegrade flow. Partially visualized cervical left ICA tortuous. Petrous segments patent bilaterally. Cavernous and supraclinoid right ICA widely patent without stenosis. 4 mm saccular outpouching arising from the cavernous right ICA consistent with a small paraophthalmic aneurysm (series 9, image 119). Atheromatous change within the left carotid siphon with associated mild-to-moderate stenosis (series 9, image 119). Left A1 widely patent. Right A1 hypoplastic and/or absent. Normal anterior communicating artery complex. Anterior cerebral arteries  grossly patent to their distal aspects without stenosis. Left M1 patent. Grossly normal left MCA bifurcation. Distal left MCA branches perfused. Right M1 segment. There remains a patent small anterior right temporal branch. 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 Posterior circulation: Dominant left vertebral artery patent to the vertebrobasilar junction without stenosis. Left PICA patent. Right vertebral artery largely terminates at the level of the right PICA, although a tiny branch ascending towards the vertebrobasilar junction. Right PICA patent as well. Basilar mildly diminutive but patent to its distal aspect without stenosis. Superior cerebral arteries patent bilaterally. Fetal type origin of the PCAs bilaterally. PCAs remain well perfused to their distal aspects without obvious proximal high-grade stenosis. Anatomic variants: Fetal type origin of the PCAs. Dominant left vertebral artery. Hypoplastic/absent right A1 segment, with the anterior cerebral artery supplied via the left carotid artery system. Other: None. IMPRESSION: 1. 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 2. Atheromatous change within the left carotid siphon with associated mild-to-moderate stenosis. 3. 4 mm cavernous right ICA aneurysm. 4. Fetal type origin of the PCAs bilaterally. Hypoplastic/absent right A1, with the anterior cerebral arteries supplied primarily via the left carotid artery system. Electronically Signed   By: Jeannine Boga M.D.   On: 02/25/2021 23:44   MR BRAIN WO CONTRAST  Result Date: 02/25/2021 CLINICAL DATA:  Stroke suspected EXAM: MRI HEAD WITHOUT CONTRAST TECHNIQUE: Multiplanar, multiecho pulse sequences of the brain and surrounding structures were obtained without intravenous contrast. COMPARISON:  09/12/2020 MRI, correlation is also made with CT head 02/25/2021. FINDINGS: Evaluation somewhat limited by motion  artifact. Brain: Small area of cortical restricted diffusion in the right parietal lobe (series 5, images 33-40), within area of encephalomalacia from prior right MCA territory infarct. No acute hemorrhage, mass, mass effect, or midline shift. No extra-axial fluid collection. Redemonstrated chronic microhemorrhages in the bilateral deep gray nuclei. Confluent T2 hyperintense signal in the periventricular white matter and pons, likely the sequela of here chronic  small vessel ischemic disease. Unchanged ex vacuo dilatation of the right lateral ventricle. Vascular: 08/21/2019 Skull and upper cervical spine: Normal marrow signal. Sinuses/Orbits: Status post bilateral lens replacements. Otherwise negative. Other: Trace fluid in right mastoid air cells. IMPRESSION: Evaluation is somewhat limited by motion artifact. Within this limitation, there is acute ischemia within an area of remote infarct in the right parietal lobe. No evidence of hemorrhagic transformation. These results were called by telephone at the time of interpretation on 02/25/2021 at 8:16 am to provider [facility] , who verbally acknowledged these results. Electronically Signed   By: Merilyn Baba M.D.   On: 02/25/2021 11:18   US Carotid Bilateral  Result Date: 02/26/2021 CLINICAL DATA:  Stroke symptoms, syncope hyperlipidemia, diabetes EXAM: BILATERAL CAROTID DUPLEX ULTRASOUND TECHNIQUE: Pearline Cables scale imaging, color Doppler and duplex ultrasound were performed of bilateral carotid and vertebral arteries in the neck. COMPARISON:  None. FINDINGS: Criteria: Quantification of carotid stenosis is based on velocity parameters that correlate the residual internal carotid diameter with NASCET-based stenosis levels, using the diameter of the distal internal carotid lumen as the denominator for stenosis measurement. The following velocity measurements were obtained: RIGHT ICA: 76/12 cm/sec CCA: 93/8 cm/sec SYSTOLIC ICA/CCA RATIO:  1.4 ECA: 51 cm/sec LEFT ICA: 86/19  cm/sec CCA: 18/2 cm/sec SYSTOLIC ICA/CCA RATIO:  1.3 ECA: 161 cm/sec RIGHT CAROTID ARTERY: Scattered mild-to-moderate echogenic shadowing plaque formation. No hemodynamically significant right ICA stenosis, velocity elevation, or turbulent flow. Degree of narrowing less than 50%. RIGHT VERTEBRAL ARTERY:  Normal antegrade flow LEFT CAROTID ARTERY: Similar scattered moderate echogenic plaque formation. No hemodynamically significant left ICA stenosis, velocity elevation, or turbulent flow. LEFT VERTEBRAL ARTERY:  Normal antegrade flow IMPRESSION: Moderate bilateral carotid atherosclerosis. Negative for significant stenosis. Degree of narrowing less than 50% bilaterally by ultrasound criteria. Patent antegrade vertebral flow bilaterally Electronically Signed   By: Jerilynn Mages.  Shick M.D.   On: 02/26/2021 07:26   US Venous Img Upper Uni Left (DVT)  Result Date: 02/27/2021 CLINICAL DATA:  Left upper extremity edema. Patient is status post ligation of arteriovenous graft on 02/26/2021. EXAM: LEFT UPPER EXTREMITY VENOUS DOPPLER ULTRASOUND TECHNIQUE: Gray-scale sonography with graded compression, as well as color Doppler and duplex ultrasound were performed to evaluate the upper extremity deep venous system from the level of the subclavian vein and including the jugular, axillary, basilic, radial, ulnar and upper cephalic vein. Spectral Doppler was utilized to evaluate flow at rest and with distal augmentation maneuvers. COMPARISON:  None. FINDINGS: Contralateral Subclavian Vein: Respiratory phasicity is normal and symmetric with the symptomatic side. No evidence of thrombus. Normal compressibility. Internal Jugular Vein: No evidence of thrombus. Normal compressibility, respiratory phasicity and response to augmentation. Subclavian Vein: No evidence of thrombus. Normal compressibility, respiratory phasicity and response to augmentation. Axillary Vein: No evidence of thrombus. Normal compressibility, respiratory phasicity and  response to augmentation. Cephalic Vein: No evidence of thrombus. Normal compressibility, respiratory phasicity and response to augmentation. Basilic Vein: No evidence of thrombus. Normal compressibility, respiratory phasicity and response to augmentation. Brachial Veins: No evidence of thrombus. Normal compressibility, respiratory phasicity and response to augmentation. Radial Veins: No evidence of thrombus. Normal compressibility, respiratory phasicity and response to augmentation. Ulnar Veins: No evidence of thrombus. Normal compressibility, respiratory phasicity and response to augmentation. Venous Reflux:  None visualized. Other Findings: Occluded arteriovenous graft visualized. Irregular amorphous complex fluid partially encircles the graft. There are areas of more solid or cystic component. No evidence of internal vascularity. No evidence of peripheral hyperemia. IMPRESSION: No evidence of  DVT within the left upper extremity. Occluded left upper extremity arteriovenous graft with complex fluid partially encircling the graft in the proximal upper arm. Differential considerations include postoperative hematoma versus abscess (in the appropriate clinical setting). Given the sonographic appearance, hematoma is favored. Electronically Signed   By: Jacqulynn Cadet M.D.   On: 02/27/2021 11:05   [place] OR NERVE BLOCK-IMAGE ONLY [facility])  Result Date: 02/24/2021 There is no interpretation for this exam.  This order is for images obtained during a surgical procedure.  Please See "Surgeries" Tab for more information regarding the procedure.    Microbiology: Recent Results (from the past 240 hour(s))  Resp Panel by RT-PCR (Flu A&B, Covid) Nasopharyngeal Swab     Status: None   Collection Time: 02/25/21 12:01 PM   Specimen: Nasopharyngeal Swab; Nasopharyngeal(NP) swabs in vial transport medium  Result Value Ref Range Status   SARS Coronavirus 2 by RT PCR NEGATIVE NEGATIVE Final    Comment: (NOTE) SARS-CoV-2  target nucleic acids are NOT DETECTED.  The SARS-CoV-2 RNA is generally detectable in upper respiratory specimens during the acute phase of infection. The lowest concentration of SARS-CoV-2 viral copies this assay can detect is 138 copies/mL. A negative result does not preclude SARS-Cov-2 infection and should not be used as the sole basis for treatment or other patient management decisions. A negative result may occur with  improper specimen collection/handling, submission of specimen other than nasopharyngeal swab, presence of viral mutation(s) within the areas targeted by this assay, and inadequate number of viral copies(<138 copies/mL). A negative result must be combined with clinical observations, patient history, and epidemiological information. The expected result is Negative.  Fact Sheet for Patients:  EntrepreneurPulse.com.au  Fact Sheet for Healthcare Providers:  IncredibleEmployment.be  This test is no t yet approved or cleared by the [place] FDA and  has been authorized for detection and/or diagnosis of SARS-CoV-2 by FDA under an Emergency Use Authorization (EUA). This EUA will remain  in effect (meaning this test can be used) for the duration of the COVID-19 declaration under Section 564(b)(1) of the Act, 21 U.S.C.section 360bbb-3(b)(1), unless the authorization is terminated  or revoked sooner.       Influenza A by PCR NEGATIVE NEGATIVE Final   Influenza B by PCR NEGATIVE NEGATIVE Final    Comment: (NOTE) The Xpert Xpress SARS-CoV-2/FLU/RSV plus assay is intended as an aid in the diagnosis of influenza from Nasopharyngeal swab specimens and should not be used as a sole basis for treatment. Nasal washings and aspirates are unacceptable for Xpert Xpress SARS-CoV-2/FLU/RSV testing.  Fact Sheet for Patients: EntrepreneurPulse.com.au  Fact Sheet for Healthcare  Providers: IncredibleEmployment.be  This test is not yet approved or cleared by the [place] FDA and has been authorized for detection and/or diagnosis of SARS-CoV-2 by FDA under an Emergency Use Authorization (EUA). This EUA will remain in effect (meaning this test can be used) for the duration of the COVID-19 declaration under Section 564(b)(1) of the Act, 21 U.S.C. section 360bbb-3(b)(1), unless the authorization is terminated or revoked.  Performed at [facility], [address] [place], [place] [address]   CULTURE, BLOOD (ROUTINE X 2) w Reflex to ID Panel     Status: None (Preliminary result)   Collection Time: 03/02/21  6:30 PM   Specimen: BLOOD  Result Value Ref Range Status   Specimen Description BLOOD BLOOD RIGHT WRIST  Final   Special Requests   Final    BOTTLES DRAWN AEROBIC AND ANAEROBIC Blood Culture adequate volume  Culture   Final    NO GROWTH < 24 HOURS Performed at [facility], [address] [place], [place] [address]    Report Status PENDING  Incomplete  Resp Panel by RT-PCR (Flu A&B, Covid) Nasopharyngeal Swab     Status: None   Collection Time: 03/02/21  6:35 PM   Specimen: Nasopharyngeal Swab; Nasopharyngeal(NP) swabs in vial transport medium  Result Value Ref Range Status   SARS Coronavirus 2 by RT PCR NEGATIVE NEGATIVE Final    Comment: (NOTE) SARS-CoV-2 target nucleic acids are NOT DETECTED.  The SARS-CoV-2 RNA is generally detectable in upper respiratory specimens during the acute phase of infection. The lowest concentration of SARS-CoV-2 viral copies this assay can detect is 138 copies/mL. A negative result does not preclude SARS-Cov-2 infection and should not be used as the sole basis for treatment or other patient management decisions. A negative result may occur with  improper specimen collection/handling, submission of specimen other than nasopharyngeal swab, presence of viral mutation(s) within  the areas targeted by this assay, and inadequate number of viral copies(<138 copies/mL). A negative result must be combined with clinical observations, patient history, and epidemiological information. The expected result is Negative.  Fact Sheet for Patients:  EntrepreneurPulse.com.au  Fact Sheet for Healthcare Providers:  IncredibleEmployment.be  This test is no t yet approved or cleared by the [place] FDA and  has been authorized for detection and/or diagnosis of SARS-CoV-2 by FDA under an Emergency Use Authorization (EUA). This EUA will remain  in effect (meaning this test can be used) for the duration of the COVID-19 declaration under Section 564(b)(1) of the Act, 21 U.S.C.section 360bbb-3(b)(1), unless the authorization is terminated  or revoked sooner.       Influenza A by PCR NEGATIVE NEGATIVE Final   Influenza B by PCR NEGATIVE NEGATIVE Final    Comment: (NOTE) The Xpert Xpress SARS-CoV-2/FLU/RSV plus assay is intended as an aid in the diagnosis of influenza from Nasopharyngeal swab specimens and should not be used as a sole basis for treatment. Nasal washings and aspirates are unacceptable for Xpert Xpress SARS-CoV-2/FLU/RSV testing.  Fact Sheet for Patients: EntrepreneurPulse.com.au  Fact Sheet for Healthcare Providers: IncredibleEmployment.be  This test is not yet approved or cleared by the [place] FDA and has been authorized for detection and/or diagnosis of SARS-CoV-2 by FDA under an Emergency Use Authorization (EUA). This EUA will remain in effect (meaning this test can be used) for the duration of the COVID-19 declaration under Section 564(b)(1) of the Act, 21 U.S.C. section 360bbb-3(b)(1), unless the authorization is terminated or revoked.  Performed at [facility], [address] [place], [place] [address]   CULTURE, BLOOD (ROUTINE X 2) w Reflex to ID Panel      Status: None (Preliminary result)   Collection Time: 03/02/21  6:40 PM   Specimen: BLOOD  Result Value Ref Range Status   Specimen Description BLOOD BLOOD RIGHT HAND  Final   Special Requests   Final    BOTTLES DRAWN AEROBIC AND ANAEROBIC Blood Culture results may not be optimal due to an inadequate volume of blood received in culture bottles   Culture   Final    NO GROWTH < 24 HOURS Performed at [facility], [address], [place], [place] [address]    Report Status PENDING  Incomplete     Labs: Basic Metabolic Panel: Recent Labs  Lab 02/27/21 0530 02/28/21 0617 03/01/21 0544 03/02/21 0453 03/03/21 0719  NA 135 133* 135 134*  135  K 3.4* 3.9 3.7 3.7 3.8  CL 95* 96* 97* 97* 100  CO2 30 28 29 29 31   GLUCOSE 149* 138* 123* 168* 117*  BUN 25* 38* 26* 35* 25*  CREATININE 3.88* 5.67* 4.28* 5.37* 3.79*  CALCIUM 8.5* 8.0* 7.8* 8.0* 8.1*  PHOS  --  3.6  --   --   --    Liver Function Tests: No results for input(s): AST, ALT, ALKPHOS, BILITOT, PROT, ALBUMIN in the last 168 hours. No results for input(s): LIPASE, AMYLASE in the last 168 hours. No results for input(s): AMMONIA in the last 168 hours. CBC: Recent Labs  Lab 02/25/21 0841 02/27/21 0530 02/28/21 0502 03/01/21 0544 03/02/21 0453 03/03/21 0719  WBC 6.9 5.0 5.4 5.3 4.5 4.3  NEUTROABS 5.6  --   --   --   --   --   HGB 10.5* 11.7* 9.7* 8.6* 8.4* 8.3*  HCT 29.7* 31.5* 27.6* 23.9* 23.7* 23.6*  MCV 88.1 87.7 90.8 87.9 88.4 88.4  PLT 115* 119* 112* 119* 122* 126*   Cardiac Enzymes: No results for input(s): CKTOTAL, CKMB, CKMBINDEX, TROPONINI in the last 168 hours. BNP: BNP (last 3 results) No results for input(s): BNP in the last 8760 hours.  ProBNP (last 3 results) No results for input(s): PROBNP in the last 8760 hours.  CBG: Recent Labs  Lab 03/02/21 1341 03/02/21 1736 03/02/21 2024 03/03/21 0751 03/03/21 1129  GLUCAP 172* 186* 142* 107* 191*       Signed:  Florencia Reasons MD, PhD,  FACP  [facility] 03/03/2021, 2:46 PM

## 2021-03-04 ENCOUNTER — Telehealth (INDEPENDENT_AMBULATORY_CARE_PROVIDER_SITE_OTHER): Payer: Self-pay | Admitting: Vascular Surgery

## 2021-03-04 DIAGNOSIS — N2581 Secondary hyperparathyroidism of renal origin: Secondary | ICD-10-CM | POA: Diagnosis not present

## 2021-03-04 DIAGNOSIS — D631 Anemia in chronic kidney disease: Secondary | ICD-10-CM | POA: Diagnosis not present

## 2021-03-04 DIAGNOSIS — Z23 Encounter for immunization: Secondary | ICD-10-CM | POA: Diagnosis not present

## 2021-03-04 DIAGNOSIS — D509 Iron deficiency anemia, unspecified: Secondary | ICD-10-CM | POA: Diagnosis not present

## 2021-03-04 DIAGNOSIS — Z992 Dependence on renal dialysis: Secondary | ICD-10-CM | POA: Diagnosis not present

## 2021-03-04 DIAGNOSIS — N186 End stage renal disease: Secondary | ICD-10-CM | POA: Diagnosis not present

## 2021-03-04 NOTE — Telephone Encounter (Signed)
Facility called to see when her f/u was. I advised her on the sisters concerns and they advised me that patient was just admitted to them (03/03/21) yesterday but they will work on having her evaluated there and will call us if need be.

## 2021-03-04 NOTE — Telephone Encounter (Signed)
Called stating that patients left hand is severely swollen. Sister stated that the blisters that were on her while hospitalized (hospital wrapped her hand) and now that the bandage is off sister states that her hand needs to be evaluated. Patient is due to come in to be seen sooner. I stated that she may see if nursing home can have her evaluated or be transported to the ED.

## 2021-03-07 DIAGNOSIS — N186 End stage renal disease: Secondary | ICD-10-CM | POA: Diagnosis not present

## 2021-03-07 DIAGNOSIS — Z23 Encounter for immunization: Secondary | ICD-10-CM | POA: Diagnosis not present

## 2021-03-07 DIAGNOSIS — N2581 Secondary hyperparathyroidism of renal origin: Secondary | ICD-10-CM | POA: Diagnosis not present

## 2021-03-07 DIAGNOSIS — D631 Anemia in chronic kidney disease: Secondary | ICD-10-CM | POA: Diagnosis not present

## 2021-03-07 DIAGNOSIS — D509 Iron deficiency anemia, unspecified: Secondary | ICD-10-CM | POA: Diagnosis not present

## 2021-03-07 DIAGNOSIS — Z992 Dependence on renal dialysis: Secondary | ICD-10-CM | POA: Diagnosis not present

## 2021-03-07 LAB — CULTURE, BLOOD (ROUTINE X 2)
Culture: NO GROWTH
Culture: NO GROWTH
Special Requests: ADEQUATE

## 2021-03-08 ENCOUNTER — Other Ambulatory Visit (INDEPENDENT_AMBULATORY_CARE_PROVIDER_SITE_OTHER): Payer: Self-pay | Admitting: Nurse Practitioner

## 2021-03-08 DIAGNOSIS — Z992 Dependence on renal dialysis: Secondary | ICD-10-CM | POA: Diagnosis not present

## 2021-03-08 DIAGNOSIS — E1140 Type 2 diabetes mellitus with diabetic neuropathy, unspecified: Secondary | ICD-10-CM | POA: Diagnosis not present

## 2021-03-08 DIAGNOSIS — M6281 Muscle weakness (generalized): Secondary | ICD-10-CM | POA: Diagnosis not present

## 2021-03-08 DIAGNOSIS — I480 Paroxysmal atrial fibrillation: Secondary | ICD-10-CM | POA: Diagnosis not present

## 2021-03-08 DIAGNOSIS — I10 Essential (primary) hypertension: Secondary | ICD-10-CM | POA: Diagnosis not present

## 2021-03-08 DIAGNOSIS — E782 Mixed hyperlipidemia: Secondary | ICD-10-CM | POA: Diagnosis not present

## 2021-03-08 DIAGNOSIS — M7989 Other specified soft tissue disorders: Secondary | ICD-10-CM

## 2021-03-08 DIAGNOSIS — G4739 Other sleep apnea: Secondary | ICD-10-CM | POA: Diagnosis not present

## 2021-03-08 DIAGNOSIS — G309 Alzheimer's disease, unspecified: Secondary | ICD-10-CM | POA: Diagnosis not present

## 2021-03-08 DIAGNOSIS — I6529 Occlusion and stenosis of unspecified carotid artery: Secondary | ICD-10-CM | POA: Diagnosis not present

## 2021-03-08 DIAGNOSIS — I6330 Cerebral infarction due to thrombosis of unspecified cerebral artery: Secondary | ICD-10-CM | POA: Diagnosis not present

## 2021-03-08 DIAGNOSIS — N186 End stage renal disease: Secondary | ICD-10-CM | POA: Diagnosis not present

## 2021-03-09 ENCOUNTER — Ambulatory Visit (INDEPENDENT_AMBULATORY_CARE_PROVIDER_SITE_OTHER): Payer: Medicare Other | Admitting: Nurse Practitioner

## 2021-03-09 ENCOUNTER — Ambulatory Visit (INDEPENDENT_AMBULATORY_CARE_PROVIDER_SITE_OTHER): Payer: Medicare Other

## 2021-03-09 ENCOUNTER — Other Ambulatory Visit: Payer: Self-pay | Admitting: *Deleted

## 2021-03-09 ENCOUNTER — Other Ambulatory Visit: Payer: Self-pay

## 2021-03-09 VITALS — BP 121/66 | HR 74 | Resp 16

## 2021-03-09 DIAGNOSIS — D509 Iron deficiency anemia, unspecified: Secondary | ICD-10-CM | POA: Diagnosis not present

## 2021-03-09 DIAGNOSIS — M7989 Other specified soft tissue disorders: Secondary | ICD-10-CM

## 2021-03-09 DIAGNOSIS — R600 Localized edema: Secondary | ICD-10-CM | POA: Diagnosis not present

## 2021-03-09 DIAGNOSIS — E785 Hyperlipidemia, unspecified: Secondary | ICD-10-CM

## 2021-03-09 DIAGNOSIS — N186 End stage renal disease: Secondary | ICD-10-CM | POA: Diagnosis not present

## 2021-03-09 DIAGNOSIS — D631 Anemia in chronic kidney disease: Secondary | ICD-10-CM | POA: Diagnosis not present

## 2021-03-09 DIAGNOSIS — Z23 Encounter for immunization: Secondary | ICD-10-CM | POA: Diagnosis not present

## 2021-03-09 DIAGNOSIS — I10 Essential (primary) hypertension: Secondary | ICD-10-CM

## 2021-03-09 DIAGNOSIS — Z992 Dependence on renal dialysis: Secondary | ICD-10-CM

## 2021-03-09 DIAGNOSIS — N2581 Secondary hyperparathyroidism of renal origin: Secondary | ICD-10-CM | POA: Diagnosis not present

## 2021-03-09 MED ORDER — SILVER SULFADIAZINE 1 % EX CREA: 1.0000 "application " | TOPICAL_CREAM | Freq: Every day | CUTANEOUS | 0 refills | Status: DC

## 2021-03-09 MED ORDER — DOXYCYCLINE HYCLATE 100 MG PO CAPS: 100.0000 mg | ORAL_CAPSULE | Freq: Two times a day (BID) | ORAL | 0 refills | Status: DC

## 2021-03-09 NOTE — Patient Outreach (Signed)
[name] resides in [facility]. Screened for potential [facility] care coordination needs.   [facility] SW reports family meeting has been scheduled. Anticipated transition plan is to return to home with spouse.   Will continue to follow while in SNF.    Brianna Rolling, MSN, RN,BSN [place] Acute Care Coordinator [phone] [facility]) [phone]  (Toll free office)

## 2021-03-10 ENCOUNTER — Telehealth (INDEPENDENT_AMBULATORY_CARE_PROVIDER_SITE_OTHER): Payer: Self-pay

## 2021-03-10 DIAGNOSIS — G4733 Obstructive sleep apnea (adult) (pediatric): Secondary | ICD-10-CM | POA: Diagnosis not present

## 2021-03-10 DIAGNOSIS — N183 Chronic kidney disease, stage 3 unspecified: Secondary | ICD-10-CM | POA: Diagnosis not present

## 2021-03-10 DIAGNOSIS — Z992 Dependence on renal dialysis: Secondary | ICD-10-CM | POA: Diagnosis not present

## 2021-03-10 DIAGNOSIS — G939 Disorder of brain, unspecified: Secondary | ICD-10-CM | POA: Diagnosis not present

## 2021-03-10 DIAGNOSIS — F0280 Dementia in other diseases classified elsewhere without behavioral disturbance: Secondary | ICD-10-CM | POA: Diagnosis not present

## 2021-03-10 DIAGNOSIS — N186 End stage renal disease: Secondary | ICD-10-CM | POA: Diagnosis not present

## 2021-03-10 DIAGNOSIS — E1122 Type 2 diabetes mellitus with diabetic chronic kidney disease: Secondary | ICD-10-CM | POA: Diagnosis not present

## 2021-03-10 DIAGNOSIS — E782 Mixed hyperlipidemia: Secondary | ICD-10-CM | POA: Diagnosis not present

## 2021-03-10 DIAGNOSIS — I10 Essential (primary) hypertension: Secondary | ICD-10-CM | POA: Diagnosis not present

## 2021-03-10 DIAGNOSIS — I69354 Hemiplegia and hemiparesis following cerebral infarction affecting left non-dominant side: Secondary | ICD-10-CM | POA: Diagnosis not present

## 2021-03-10 DIAGNOSIS — I480 Paroxysmal atrial fibrillation: Secondary | ICD-10-CM | POA: Diagnosis not present

## 2021-03-10 DIAGNOSIS — G309 Alzheimer's disease, unspecified: Secondary | ICD-10-CM | POA: Diagnosis not present

## 2021-03-10 DIAGNOSIS — F0150 Vascular dementia without behavioral disturbance: Secondary | ICD-10-CM | POA: Diagnosis not present

## 2021-03-10 DIAGNOSIS — G4739 Other sleep apnea: Secondary | ICD-10-CM | POA: Diagnosis not present

## 2021-03-10 DIAGNOSIS — I6930 Unspecified sequelae of cerebral infarction: Secondary | ICD-10-CM | POA: Diagnosis not present

## 2021-03-10 NOTE — Telephone Encounter (Signed)
Yes, She was sent with a new Rx and notes (on their note sheet).  She should be taking doxycycline BID for 14 days.  She should also have Sulfa Silvadene applied to her wound daily.  She should also be elevating her hand above her heart as much as possible.  It should never be left dependent.

## 2021-03-10 NOTE — Telephone Encounter (Signed)
Brianna Dodson from [place] was made aware with medical recommendations

## 2021-03-11 ENCOUNTER — Other Ambulatory Visit (INDEPENDENT_AMBULATORY_CARE_PROVIDER_SITE_OTHER): Payer: Self-pay | Admitting: Nurse Practitioner

## 2021-03-11 ENCOUNTER — Ambulatory Visit: Payer: Medicare Other | Admitting: Family Medicine

## 2021-03-11 DIAGNOSIS — Z23 Encounter for immunization: Secondary | ICD-10-CM | POA: Diagnosis not present

## 2021-03-11 DIAGNOSIS — N2581 Secondary hyperparathyroidism of renal origin: Secondary | ICD-10-CM | POA: Diagnosis not present

## 2021-03-11 DIAGNOSIS — Z992 Dependence on renal dialysis: Secondary | ICD-10-CM | POA: Diagnosis not present

## 2021-03-11 DIAGNOSIS — N186 End stage renal disease: Secondary | ICD-10-CM | POA: Diagnosis not present

## 2021-03-11 DIAGNOSIS — D509 Iron deficiency anemia, unspecified: Secondary | ICD-10-CM | POA: Diagnosis not present

## 2021-03-11 DIAGNOSIS — D631 Anemia in chronic kidney disease: Secondary | ICD-10-CM | POA: Diagnosis not present

## 2021-03-11 MED ORDER — SILVER SULFADIAZINE 1 % EX CREA: 1.0000 "application " | TOPICAL_CREAM | Freq: Every day | CUTANEOUS | 0 refills | Status: DC

## 2021-03-11 MED ORDER — DOXYCYCLINE HYCLATE 100 MG PO CAPS: 100.0000 mg | ORAL_CAPSULE | Freq: Two times a day (BID) | ORAL | 0 refills | Status: DC

## 2021-03-11 NOTE — Telephone Encounter (Signed)
Patients sister called stating that patient checked herself out of nursing home before she could receive medications. Patients sister would like [place] to resend Rx of antibiotics and cream [facility] [place] on [address].  Please advise.

## 2021-03-11 NOTE — Telephone Encounter (Signed)
Those have been sent to pharmacy as requested

## 2021-03-12 DIAGNOSIS — G309 Alzheimer's disease, unspecified: Secondary | ICD-10-CM | POA: Diagnosis not present

## 2021-03-12 DIAGNOSIS — I120 Hypertensive chronic kidney disease with stage 5 chronic kidney disease or end stage renal disease: Secondary | ICD-10-CM | POA: Diagnosis not present

## 2021-03-12 DIAGNOSIS — D631 Anemia in chronic kidney disease: Secondary | ICD-10-CM | POA: Diagnosis not present

## 2021-03-12 DIAGNOSIS — N186 End stage renal disease: Secondary | ICD-10-CM | POA: Diagnosis not present

## 2021-03-12 DIAGNOSIS — E1122 Type 2 diabetes mellitus with diabetic chronic kidney disease: Secondary | ICD-10-CM | POA: Diagnosis not present

## 2021-03-12 DIAGNOSIS — F0280 Dementia in other diseases classified elsewhere without behavioral disturbance: Secondary | ICD-10-CM | POA: Diagnosis not present

## 2021-03-14 ENCOUNTER — Encounter (INDEPENDENT_AMBULATORY_CARE_PROVIDER_SITE_OTHER): Payer: Self-pay | Admitting: Nurse Practitioner

## 2021-03-14 DIAGNOSIS — Z992 Dependence on renal dialysis: Secondary | ICD-10-CM | POA: Diagnosis not present

## 2021-03-14 DIAGNOSIS — N186 End stage renal disease: Secondary | ICD-10-CM | POA: Diagnosis not present

## 2021-03-14 DIAGNOSIS — D509 Iron deficiency anemia, unspecified: Secondary | ICD-10-CM | POA: Diagnosis not present

## 2021-03-14 DIAGNOSIS — Z23 Encounter for immunization: Secondary | ICD-10-CM | POA: Diagnosis not present

## 2021-03-14 DIAGNOSIS — D631 Anemia in chronic kidney disease: Secondary | ICD-10-CM | POA: Diagnosis not present

## 2021-03-14 DIAGNOSIS — N2581 Secondary hyperparathyroidism of renal origin: Secondary | ICD-10-CM | POA: Diagnosis not present

## 2021-03-14 NOTE — Progress Notes (Signed)
Subjective:    [name], female    DOB: 1933/10/03, 87 y.o.   MRN: [id number] Chief Complaint  Patient presents with  . Follow-up    [facility] 2 week AVG placement left hand swelling    Brianna Dodson is an 87 year old female that returns today following insertion of a left upper extremity graft following failed left brachiocephalic fistula.  The graft restarted on 02/24/2021 and the patient subsequently presented to the emergency room on 02/25/2021 with complaints of pain in her upper extremity as well as the inability to move her left arm.  The patient does have a history of multiple CVAs.  It was felt that some of the symptoms may be related to steal syndrome and it was subsequently ligated on 02/26/2021.  Since that time the patient has had extreme left upper extremity swelling, which also happened following placement of her brachiocephalic AV fistula.  The patient is also subsequently developed blistering along her forearm as well.  These blisters are tender and painful for the patient.  She denies any fever or chills.   Review of Systems  Cardiovascular:        Arm swelling  Skin:  Positive for wound.  Neurological:  Positive for weakness.  All other systems reviewed and are negative.     Objective:   Physical Exam Vitals reviewed.  HENT:     Head: Normocephalic.  Cardiovascular:     Rate and Rhythm: Normal rate.  Pulmonary:     Effort: Pulmonary effort is normal.  Musculoskeletal:     Comments: Left arm swelling  Skin:      Neurological:     Mental Status: She is alert and oriented to person, place, and time.     Comments: Able to move left arm  Psychiatric:        Mood and Affect: Mood normal.        Behavior: Behavior normal.        Thought Content: Thought content normal.        Judgment: Judgment normal.    BP 121/66 (BP Location: Right Arm)   Pulse 74   Resp 16   Past Medical History:  Diagnosis Date  . A-fib ([place])   . Alzheimer disease ([place])    . Anemia   . Carotid stenosis   . Cerebral infarction ([place]) 04/12/2019   Multiple new foci of acute infarction affect the RIGHT hemisphere affecting the frontal, posterior frontal, posterior temporal, anterior parietal cortex and regional white matter.  . Cerebral infarction due to embolism of right middle cerebral artery ([place]) 08/20/2019   RIGHT MCA distal M1  . Chronic anticoagulation    Apixaban  . CKD (chronic kidney disease), stage 4(HCC)   . Embolic stroke involving cerebral artery ([place]) 03/15/2019   RIGHT MCA/PCA territory  . Encephalomalacia    RIGHT posterior MCA territory  . HCV (hepatitis C virus)   . History of blood transfusion   . Hyperlipidemia   . Hypertension   . OSA (obstructive sleep apnea)   . Status post placement of implantable loop recorder 07/30/2019  . T2DM (type 2 diabetes mellitus) ([place])     Social History   Socioeconomic History  . Marital status: Married    Spouse name: Hubbard Robinson  . Number of children: 2  . Years of education: Not on file  . Highest education level: Not on file  Occupational History  . 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 Retired  Tobacco  Use  . Smoking status: Never  . Smokeless tobacco: Never  Vaping Use  . Vaping Use: Never used  Substance and Sexual Activity  . Alcohol use: No  . Drug use: No  . Sexual activity: Not Currently  Other Topics Concern  . Not on file  Social History Narrative   She is married and has two adult children (adopted).    Hobbies: Decorating   Exercise: None   Caffeine: 4 cups a week   Social Determinants of Health   Financial Resource Strain: Low Risk   . Difficulty of Paying Living Expenses: Not very hard  Food Insecurity: Not on file  Transportation Needs: Not on file  Physical Activity: Not on file  Stress: Not on file  Social Connections: Not on file  Intimate Partner Violence: Not on file    Past Surgical History:  Procedure Laterality Date  . ABDOMINAL  HYSTERECTOMY  07/04/1983  . AV FISTULA PLACEMENT Left 12/02/2020   Procedure: INSERTION OF ARTERIOVENOUS (AV)  FISTULA  ARM ( BRACHIO- CEPHALIC);  Surgeon: Algernon Huxley, MD;  Location: [facility] ORS;  Service: Vascular;  Laterality: Left;  . AV FISTULA PLACEMENT Left 02/24/2021   Procedure: INSERTION OF ARTERIOVENOUS (AV) GORE-TEX GRAFT ARM;  Surgeon: Algernon Huxley, MD;  Location: [facility] ORS;  Service: Vascular;  Laterality: Left;  Left brachial Ax graft  . CATARACT EXTRACTION, BILATERAL Bilateral   . DIALYSIS/PERMA CATHETER INSERTION N/A 10/06/2020   Procedure: DIALYSIS/PERMA CATHETER INSERTION;  Surgeon: Algernon Huxley, MD;  Location: [place] [facility];  Service: Cardiovascular;  Laterality: N/A;  . LIGATION OF ARTERIOVENOUS  FISTULA Left 02/26/2021   Procedure: LIGATION OF ARTERIOVENOUS  FISTULA;  Surgeon: Bertram Savin, MD;  Location: [facility] ORS;  Service: Vascular;  Laterality: Left;  . LOOP RECORDER INSERTION N/A 07/30/2019   Procedure: LOOP RECORDER INSERTION;  Surgeon: Isaias Cowman, MD;  Location: [place] CV LAB;  Service: Cardiovascular;  Laterality: N/A;  . TEE WITHOUT CARDIOVERSION N/A 04/14/2019   Procedure: TRANSESOPHAGEAL ECHOCARDIOGRAM (TEE);  Surgeon: Teodoro Spray, MD;  Location: [facility] ORS;  Service: Cardiovascular;  Laterality: N/A;    Family History  Problem Relation Age of Onset  . Stroke Mother   . Diabetes Mother   . Heart disease Father   . Kidney disease Sister   . Diabetes Sister   . Kidney disease Brother   . Heart disease Sister   . Diabetes Sister   . Diabetes Sister   . Diabetes Sister   . Kidney disease Sister   . Heart disease Sister   . Kidney disease Brother        kidney transplant  . Early death Brother 63       Truck Accident - died  . Heart disease Brother     Allergies  Allergen Reactions  . Nsaids     CKD stage III - Avoid all nephrotoxic drugs    CBC Latest Ref Rng & Units 03/03/2021 03/02/2021 03/01/2021  WBC 4.0 - 10.5 K/uL 4.3 4.5  5.3  Hemoglobin 12.0 - 15.0 g/dL 8.3(L) 8.4(L) 8.6(L)  Hematocrit 36.0 - 46.0 % 23.6(L) 23.7(L) 23.9(L)  Platelets 150 - 400 K/uL 126(L) 122(L) 119(L)      CMP     Component Value Date/Time   NA 135 03/03/2021 0719   NA 142 11/10/2013 0522   K 3.8 03/03/2021 0719   K 4.6 11/10/2013 0522   CL 100 03/03/2021 0719   CL 110 (H) 11/10/2013 0522   CO2 31  03/03/2021 0719   CO2 27 11/10/2013 0522   GLUCOSE 117 (H) 03/03/2021 0719   GLUCOSE 70 11/10/2013 0522   BUN 25 (H) 03/03/2021 0719   BUN 22 (H) 11/10/2013 0522   CREATININE 3.79 (H) 03/03/2021 0719   CREATININE 1.12 11/10/2013 0522   CALCIUM 8.1 (L) 03/03/2021 0719   CALCIUM 8.4 (L) 11/10/2013 0522   PROT 7.2 06/09/2020 1145   PROT 9.0 (H) 11/09/2013 1540   ALBUMIN 3.9 06/09/2020 1145   ALBUMIN 3.8 11/09/2013 1540   AST 21 06/09/2020 1145   AST 29 11/09/2013 1540   ALT 20 06/09/2020 1145   ALT 27 11/09/2013 1540   ALKPHOS 48 06/09/2020 1145   ALKPHOS 59 11/09/2013 1540   BILITOT 0.6 06/09/2020 1145   BILITOT 0.2 11/09/2013 1540   GFRNONAA 11 (L) 03/03/2021 0719   GFRNONAA 47 (L) 11/10/2013 0522   GFRAA 27 (L) 08/22/2019 0449   GFRAA 54 (L) 11/10/2013 0522     No results found.     Assessment & Plan:   1. End stage renal disease on dialysis [facility]) Noninvasive studies show that there is no evidence of DVT in the left upper extremity.  The patient continues to have severe swelling of her left upper extremity despite ligation of the new graft that was placed.  There is also a significant skin reaction on her forearm.  I have sent instruction for the patient to elevate her arm above the level of her heart is much as possible to try to help with the swelling.  We will also have the patient apply silver Silvadene cream and change the dressings daily.  We have also sent in antibiotics to cover any possible infection.  We will have the patient return in 2 weeks to evaluate progress of edema.  The patient also has an upcoming  appointment with neurology for her left arm weakness to determine if this is a consequence of previous or new stroke.  2. Essential hypertension Continue antihypertensive medications as already ordered, these medications have been reviewed and there are no changes at this time.   3. Hyperlipidemia, unspecified hyperlipidemia type Continue statin as ordered and reviewed, no changes at this time    Current Outpatient Medications on File Prior to Visit  Medication Sig Dispense Refill  . acetaminophen (TYLENOL) 325 MG tablet Take 2 tablets (650 mg total) by mouth every 6 (six) hours as needed for mild pain, fever or moderate pain.    Marland Kitchen amLODipine (NORVASC) 5 MG tablet Take 5 mg by mouth daily.    Marland Kitchen ascorbic acid (VITAMIN C) 500 MG tablet Take 1 tablet (500 mg total) by mouth daily.    Marland Kitchen atorvastatin (LIPITOR) 80 MG tablet Take 1 tablet (80 mg total) by mouth daily. 90 tablet 3  . carvedilol (COREG) 6.25 MG tablet TAKE 1 TABLET (6.25 MG TOTAL) BY MOUTH 2 (TWO) TIMES DAILY WITH A MEAL. 180 tablet 1  . ELIQUIS 2.5 MG TABS tablet Take 1 tablet (2.5 mg total) by mouth 2 (two) times daily. 56 tablet 0  . ezetimibe (ZETIA) 10 MG tablet TAKE 1 TABLET BY MOUTH EVERY DAY 90 tablet 3  . Ferrous Sulfate (IRON) 28 MG TABS Take 1 tablet by mouth daily in the afternoon.    . gabapentin (NEURONTIN) 100 MG capsule Take 1 capsule (100 mg total) by mouth at bedtime.    Marland Kitchen glucose blood (ACCU-CHEK GUIDE) test strip USE TO CHECK BLOOD SUGARS TWICE DAILY. E11.9 300 each 3  . insulin  aspart (NOVOLOG) 100 UNIT/ML injection Inject 0-9 Units into the skin 3 (three) times daily with meals. Before each meal 3 times a day, 140-199 - 2 units, 200-250 - 4 units, 251-299 - 6 units,  300-349 - 8 units,  350 or above 10 units. Insulin PEN if approved, provide syringes and needles if needed. 10 mL 11  . Lancets (ONETOUCH ULTRASOFT) lancets Use as instructed. Use patient insurance preferred brand. 300 each 3  . linagliptin  (TRADJENTA) 5 MG TABS tablet Take 1 tablet (5 mg total) by mouth daily. 90 tablet 3  . liver oil-zinc oxide (DESITIN) 40 % ointment Apply topically as needed for irritation. 56.7 g 0  . mirtazapine (REMERON) 7.5 MG tablet Take 7.5 mg by mouth at bedtime.    . multivitamin (RENA-VIT) TABS tablet Take 1 tablet by mouth at bedtime.  0  . Nutritional Supplements (FEEDING SUPPLEMENT, NEPRO CARB STEADY,) LIQD Take 237 mLs by mouth 2 (two) times daily between meals.  0  . senna-docusate (SENOKOT-S) 8.6-50 MG tablet Take 1 tablet by mouth at bedtime as needed for mild constipation.    . Vitamin D, Ergocalciferol, (DRISDOL) 50000 units CAPS capsule Take 50,000 Units by mouth every 30 (thirty) days.  1  . amLODipine (NORVASC) 10 MG tablet Take 0.5 tablets (5 mg total) by mouth daily. (Patient not taking: Reported on 03/09/2021) 45 tablet 1   No current facility-administered medications on file prior to visit.    There are no Patient Instructions on file for this visit. No follow-ups on file.   Kris Hartmann, NP

## 2021-03-15 ENCOUNTER — Telehealth: Payer: Self-pay | Admitting: Family Medicine

## 2021-03-15 ENCOUNTER — Other Ambulatory Visit: Payer: Self-pay | Admitting: *Deleted

## 2021-03-15 ENCOUNTER — Ambulatory Visit (INDEPENDENT_AMBULATORY_CARE_PROVIDER_SITE_OTHER): Payer: Medicare Other | Admitting: Family Medicine

## 2021-03-15 ENCOUNTER — Other Ambulatory Visit: Payer: Self-pay

## 2021-03-15 ENCOUNTER — Encounter: Payer: Self-pay | Admitting: Family Medicine

## 2021-03-15 VITALS — BP 128/66 | HR 75 | Ht 65.0 in | Wt 141.6 lb

## 2021-03-15 DIAGNOSIS — I120 Hypertensive chronic kidney disease with stage 5 chronic kidney disease or end stage renal disease: Secondary | ICD-10-CM | POA: Diagnosis not present

## 2021-03-15 DIAGNOSIS — L89152 Pressure ulcer of sacral region, stage 2: Secondary | ICD-10-CM | POA: Diagnosis not present

## 2021-03-15 DIAGNOSIS — G309 Alzheimer's disease, unspecified: Secondary | ICD-10-CM | POA: Diagnosis not present

## 2021-03-15 DIAGNOSIS — M79602 Pain in left arm: Secondary | ICD-10-CM

## 2021-03-15 DIAGNOSIS — I639 Cerebral infarction, unspecified: Secondary | ICD-10-CM

## 2021-03-15 DIAGNOSIS — D631 Anemia in chronic kidney disease: Secondary | ICD-10-CM | POA: Diagnosis not present

## 2021-03-15 DIAGNOSIS — I6930 Unspecified sequelae of cerebral infarction: Secondary | ICD-10-CM

## 2021-03-15 DIAGNOSIS — F0280 Dementia in other diseases classified elsewhere without behavioral disturbance: Secondary | ICD-10-CM | POA: Diagnosis not present

## 2021-03-15 DIAGNOSIS — N186 End stage renal disease: Secondary | ICD-10-CM | POA: Diagnosis not present

## 2021-03-15 DIAGNOSIS — E1122 Type 2 diabetes mellitus with diabetic chronic kidney disease: Secondary | ICD-10-CM | POA: Diagnosis not present

## 2021-03-15 MED ORDER — GABAPENTIN 100 MG PO CAPS: 100.0000 mg | ORAL_CAPSULE | ORAL | 1 refills | Status: DC

## 2021-03-15 NOTE — Patient Instructions (Signed)
Nice to see you. I have referred you to the wound clinic for your pressure sore. We will try adding in gabapentin 100 mg 3 times weekly to be taken after dialysis. You can try Tylenol 1000 mg every 8 hours.  You should not exceed 3000 mg in 1 day.  I would suggest taking plain Tylenol and avoiding the Tylenol PM.

## 2021-03-15 NOTE — Assessment & Plan Note (Signed)
Ongoing issue.  Continue risk factor management.

## 2021-03-15 NOTE — Telephone Encounter (Signed)
51 nurse from [facility] is calling in to get approval for occupational therapy for the patient,therapay for twice a week for 3 weeks and once a week for 1 week.Please call her at [phone].

## 2021-03-15 NOTE — Patient Outreach (Signed)
[place] Coordinator follow up. Member screened for potential [facility] care coordination needs.   Verified in [place] (Patient Brianna Dodson) member transitioned home on 03/10/21 from [facility]. She was in SNF for 7 days.  Telephone call made to [name] [phone]. Attempted to explain purpose of writer's call. [name] handed the phone to her grandaughter who explains [facility] is active for PT/OT/RN services. States [name] does not have available aide services. Grandaughter states she is assisting [name] with personal care. Has already PCP follow up.   Asked if they would like for writer to make referral to [facility] embedded care coordination team for follow up regarding community resources and chronic case management services. Grandaughter stated she was not sure. Writer to email [facility] link with brief description of [facility] embedded care coordination and writer's contact information.   Marthenia Rolling, MSN, RN,BSN [place] Acute Care Coordinator [phone] [facility]) [phone]  (Toll free office)

## 2021-03-15 NOTE — Assessment & Plan Note (Addendum)
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  Imaging revealed a patent graft.  I discussed that with the patient's granddaughter.  Suspect there was some nerve damage related to this issue and that is contributing to her hand pain.  We will treat her with gabapentin dosed for her dialysis.  This will be given 100 mg 3 times a week after dialysis.  She can take Tylenol 1000 mg every 8 hours.  I advised to avoid Tylenol PM given risk of the diphenhydramine component.  She will continue to follow with vascular surgery.  I advised that she seek medical attention for increasing swelling or pain.

## 2021-03-15 NOTE — Assessment & Plan Note (Signed)
I was unable to view this area directly today though the patient's granddaughter brought in a picture that did reveal an apparent stage II sacral ulcer.  The patient was hesitant to go to wound care though eventually she conceded that she would be okay with this.  A referral to wound care was placed.  I encouraged him to continue with Silvadene cream.  Home health will continue to monitor this as well.

## 2021-03-15 NOTE — Progress Notes (Signed)
Brianna Rumps, MD Phone: [phone]  Brianna Dodson is a 87 y.o. female who presents today for follow-up.  Left arm swelling and weakness: Patient was hospitalized for this.  She had a left arm AV graft placed the day prior to admission.  After she went home she developed severe pain in her left arm which was constant, sharp, and throbbing.  She also reported weakness in her left forearm with weakness in her wrist and hand.  Initially it was thought that she had a stroke though she has followed up with neurology and they felt as though the MRI findings revealed cortical laminar necrosis instead of a stroke.  There was concern for steal syndrome given imaging findings were negative for obstruction in her arm and she underwent ligation of the AV graft.  She has followed up with vascular surgery and they placed her on an antibiotic and advised Silvadene dressing changes daily.  They also advised her to elevate her arm.  She continues to have electric shocklike sensation into her hand.  The swelling in her hand remains unchanged.  The swelling in her arm has improved.  She has not been on gabapentin.  She reports continued difficulty moving her left hand.  She was taking hydrocodone for the pain though she discontinued this given that it is a narcotic.  Her granddaughter has been giving her Tylenol PM just at night.  History of stroke: Patient has followed up with neurology.  They ordered home occupational therapy as she is homebound.  She has continued on her Eliquis.  A1c was well controlled in the hospital.  [place]: This is a chronic ongoing issue.  Home health has been managing this.  The patient declines direct visualization by me today.  Social History   Tobacco Use  Smoking Status Never  Smokeless Tobacco Never    Current Outpatient Medications on File Prior to Visit  Medication Sig Dispense Refill   acetaminophen (TYLENOL) 325 MG tablet Take 2 tablets (650 mg total) by mouth every 6  (six) hours as needed for mild pain, fever or moderate pain.     amLODipine (NORVASC) 10 MG tablet Take 0.5 tablets (5 mg total) by mouth daily. 45 tablet 1   amLODipine (NORVASC) 5 MG tablet Take 5 mg by mouth daily.     ascorbic acid (VITAMIN C) 500 MG tablet Take 1 tablet (500 mg total) by mouth daily.     atorvastatin (LIPITOR) 80 MG tablet Take 1 tablet (80 mg total) by mouth daily. 90 tablet 3   carvedilol (COREG) 6.25 MG tablet TAKE 1 TABLET (6.25 MG TOTAL) BY MOUTH 2 (TWO) TIMES DAILY WITH A MEAL. 180 tablet 1   doxycycline (VIBRAMYCIN) 100 MG capsule Take 1 capsule (100 mg total) by mouth 2 (two) times daily. 28 capsule 0   ELIQUIS 2.5 MG TABS tablet Take 1 tablet (2.5 mg total) by mouth 2 (two) times daily. 56 tablet 0   ezetimibe (ZETIA) 10 MG tablet TAKE 1 TABLET BY MOUTH EVERY DAY 90 tablet 3   Ferrous Sulfate (IRON) 28 MG TABS Take 1 tablet by mouth daily in the afternoon.     glucose blood (ACCU-CHEK GUIDE) test strip USE TO CHECK BLOOD SUGARS TWICE DAILY. E11.9 300 each 3   Lancets (ONETOUCH ULTRASOFT) lancets Use as instructed. Use patient insurance preferred brand. 300 each 3   linagliptin (TRADJENTA) 5 MG TABS tablet Take 1 tablet (5 mg total) by mouth daily. 90 tablet 3   liver oil-zinc oxide (DESITIN)  40 % ointment Apply topically as needed for irritation. 56.7 g 0   mirtazapine (REMERON) 7.5 MG tablet Take 7.5 mg by mouth at bedtime.     multivitamin (RENA-VIT) TABS tablet Take 1 tablet by mouth at bedtime.  0   Nutritional Supplements (FEEDING SUPPLEMENT, NEPRO CARB STEADY,) LIQD Take 237 mLs by mouth 2 (two) times daily between meals.  0   senna-docusate (SENOKOT-S) 8.6-50 MG tablet Take 1 tablet by mouth at bedtime as needed for mild constipation.     silver sulfADIAZINE (SILVADENE) 1 % cream Apply 1 application topically daily. 400 g 0   Vitamin D, Ergocalciferol, (DRISDOL) 50000 units CAPS capsule Take 50,000 Units by mouth every 30 (thirty) days.  1   insulin aspart  (NOVOLOG) 100 UNIT/ML injection Inject 0-9 Units into the skin 3 (three) times daily with meals. Before each meal 3 times a day, 140-199 - 2 units, 200-250 - 4 units, 251-299 - 6 units,  300-349 - 8 units,  350 or above 10 units. Insulin PEN if approved, provide syringes and needles if needed. (Patient not taking: Reported on 03/15/2021) 10 mL 11   No current facility-administered medications on file prior to visit.     ROS see history of present illness  Objective  Physical Exam Vitals:   03/15/21 1209  BP: 128/66  Pulse: 75  SpO2: 98%    BP Readings from Last 3 Encounters:  03/15/21 128/66  03/09/21 121/66  03/03/21 136/71   Wt Readings from Last 3 Encounters:  03/15/21 141 lb 9.6 oz (64.2 kg)  02/25/21 150 lb 12.7 oz (68.4 kg)  02/24/21 144 lb (65.3 kg)    Physical Exam Constitutional:      General: She is not in acute distress.    Appearance: She is not diaphoretic.  Cardiovascular:     Rate and Rhythm: Normal rate and regular rhythm.     Heart sounds: Normal heart sounds.  Pulmonary:     Effort: Pulmonary effort is normal.     Breath sounds: Normal breath sounds.  Musculoskeletal:     Comments: Left hand is swollen, there is slight tenderness in the hand, there is no erythema, the hand is warm, 2+ left radial pulse, left forearm is bandaged, Staples at left AC fossa appear intact with no signs of infection, patient has difficulty moving her hand, she is able to move her upper arm  Skin:    General: Skin is warm and dry.  Neurological:     Mental Status: She is alert.     Assessment/Plan: Please see individual problem list.  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  The patient was hesitant to go to wound care though eventually she  conceded that she would be okay with this.  A referral to wound care was placed.  I encouraged him to continue with Silvadene cream.  Home health will continue to monitor this as well.      Relevant Orders   AMB referral to wound care center   History of stroke with current residual effects    Ongoing issue.  Continue risk factor management.      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  Imaging  revealed a patent graft.  I discussed that with the patient's granddaughter.  Suspect there was some nerve damage related to this issue and that is contributing to her hand pain.  We will treat her with gabapentin dosed for her dialysis.  This will be given 100 mg 3 times a week after dialysis.  She can take Tylenol 1000 mg every 8 hours.  I advised to avoid Tylenol PM given risk of the diphenhydramine component.  She will continue to follow with vascular surgery.  I advised that she seek medical attention for increasing swelling or pain.       Return in about 4 weeks (around 04/12/2021) for Left arm swelling.  This visit occurred during the SARS-CoV-2 public health emergency.  Safety protocols were in place, including screening questions prior to the visit, additional usage of staff PPE, and extensive cleaning of exam room while observing appropriate contact time as indicated for disinfecting solutions.    Brianna Rumps, MD [place]

## 2021-03-16 ENCOUNTER — Encounter: Payer: Self-pay | Admitting: *Deleted

## 2021-03-16 ENCOUNTER — Emergency Department: Payer: Medicare Other

## 2021-03-16 ENCOUNTER — Other Ambulatory Visit: Payer: Self-pay

## 2021-03-16 ENCOUNTER — Observation Stay
Admission: EM | Admit: 2021-03-16 | Discharge: 2021-03-18 | Disposition: A | Discharge status: Home / Self Care | Payer: Medicare Other | Attending: Student | Admitting: Student

## 2021-03-16 DIAGNOSIS — R55 Syncope and collapse: Secondary | ICD-10-CM | POA: Diagnosis not present

## 2021-03-16 DIAGNOSIS — I6782 Cerebral ischemia: Secondary | ICD-10-CM | POA: Diagnosis not present

## 2021-03-16 DIAGNOSIS — R0902 Hypoxemia: Secondary | ICD-10-CM | POA: Diagnosis not present

## 2021-03-16 DIAGNOSIS — I959 Hypotension, unspecified: Secondary | ICD-10-CM

## 2021-03-16 DIAGNOSIS — I480 Paroxysmal atrial fibrillation: Secondary | ICD-10-CM | POA: Insufficient documentation

## 2021-03-16 DIAGNOSIS — Z23 Encounter for immunization: Secondary | ICD-10-CM | POA: Diagnosis not present

## 2021-03-16 DIAGNOSIS — Z7901 Long term (current) use of anticoagulants: Secondary | ICD-10-CM | POA: Diagnosis not present

## 2021-03-16 DIAGNOSIS — E1129 Type 2 diabetes mellitus with other diabetic kidney complication: Secondary | ICD-10-CM | POA: Diagnosis present

## 2021-03-16 DIAGNOSIS — R42 Dizziness and giddiness: Secondary | ICD-10-CM | POA: Diagnosis not present

## 2021-03-16 DIAGNOSIS — Z79899 Other long term (current) drug therapy: Secondary | ICD-10-CM | POA: Diagnosis not present

## 2021-03-16 DIAGNOSIS — Z20822 Contact with and (suspected) exposure to covid-19: Secondary | ICD-10-CM | POA: Diagnosis not present

## 2021-03-16 DIAGNOSIS — D509 Iron deficiency anemia, unspecified: Secondary | ICD-10-CM | POA: Diagnosis not present

## 2021-03-16 DIAGNOSIS — L8990 Pressure ulcer of unspecified site, unspecified stage: Secondary | ICD-10-CM | POA: Insufficient documentation

## 2021-03-16 DIAGNOSIS — G309 Alzheimer's disease, unspecified: Secondary | ICD-10-CM | POA: Diagnosis not present

## 2021-03-16 DIAGNOSIS — N186 End stage renal disease: Secondary | ICD-10-CM

## 2021-03-16 DIAGNOSIS — Z992 Dependence on renal dialysis: Secondary | ICD-10-CM | POA: Diagnosis not present

## 2021-03-16 DIAGNOSIS — Z794 Long term (current) use of insulin: Secondary | ICD-10-CM | POA: Diagnosis not present

## 2021-03-16 DIAGNOSIS — F0150 Vascular dementia without behavioral disturbance: Secondary | ICD-10-CM | POA: Diagnosis present

## 2021-03-16 DIAGNOSIS — N2581 Secondary hyperparathyroidism of renal origin: Secondary | ICD-10-CM | POA: Diagnosis not present

## 2021-03-16 DIAGNOSIS — I6930 Unspecified sequelae of cerebral infarction: Secondary | ICD-10-CM

## 2021-03-16 DIAGNOSIS — I120 Hypertensive chronic kidney disease with stage 5 chronic kidney disease or end stage renal disease: Secondary | ICD-10-CM | POA: Insufficient documentation

## 2021-03-16 DIAGNOSIS — Z8673 Personal history of transient ischemic attack (TIA), and cerebral infarction without residual deficits: Secondary | ICD-10-CM | POA: Insufficient documentation

## 2021-03-16 DIAGNOSIS — E1122 Type 2 diabetes mellitus with diabetic chronic kidney disease: Secondary | ICD-10-CM | POA: Diagnosis not present

## 2021-03-16 DIAGNOSIS — R001 Bradycardia, unspecified: Secondary | ICD-10-CM | POA: Diagnosis not present

## 2021-03-16 DIAGNOSIS — D631 Anemia in chronic kidney disease: Secondary | ICD-10-CM | POA: Diagnosis not present

## 2021-03-16 DIAGNOSIS — I4891 Unspecified atrial fibrillation: Secondary | ICD-10-CM | POA: Diagnosis present

## 2021-03-16 DIAGNOSIS — I10 Essential (primary) hypertension: Secondary | ICD-10-CM | POA: Diagnosis present

## 2021-03-16 LAB — BASIC METABOLIC PANEL
Anion gap: 11 (ref 5–15)
BUN: 9 mg/dL (ref 8–23)
CO2: 32 mmol/L (ref 22–32)
Calcium: 7.9 mg/dL — ABNORMAL LOW (ref 8.9–10.3)
Chloride: 93 mmol/L — ABNORMAL LOW (ref 98–111)
Creatinine, Ser: 2.02 mg/dL — ABNORMAL HIGH (ref 0.44–1.00)
GFR, Estimated: 24 mL/min — ABNORMAL LOW (ref >60)
Glucose, Bld: 148 mg/dL — ABNORMAL HIGH (ref 70–99)
Potassium: 3 mmol/L — ABNORMAL LOW (ref 3.5–5.1)
Sodium: 136 mmol/L (ref 135–145)

## 2021-03-16 LAB — CBC
HCT: 25.3 % — ABNORMAL LOW (ref 36.0–46.0)
Hemoglobin: 8.7 g/dL — ABNORMAL LOW (ref 12.0–15.0)
MCH: 31.5 pg (ref 26.0–34.0)
MCHC: 34.4 g/dL (ref 30.0–36.0)
MCV: 91.7 fL (ref 80.0–100.0)
Platelets: 159 10*3/uL (ref 150–400)
RBC: 2.76 MIL/uL — ABNORMAL LOW (ref 3.87–5.11)
RDW: 14.7 % (ref 11.5–15.5)
WBC: 3.8 10*3/uL — ABNORMAL LOW (ref 4.0–10.5)
nRBC: 0 % (ref 0.0–0.2)

## 2021-03-16 LAB — TROPONIN I (HIGH SENSITIVITY): Troponin I (High Sensitivity): 15 ng/L (ref <18)

## 2021-03-16 MED ORDER — SODIUM CHLORIDE 0.9 % IV SOLN: Freq: Once | INTRAVENOUS | Status: AC

## 2021-03-16 MED ORDER — SODIUM CHLORIDE 0.9 % IV BOLUS
500.0000 mL | Freq: Once | INTRAVENOUS | Status: DC
  Administered 2021-03-17: 500 mL via INTRAVENOUS

## 2021-03-16 NOTE — Telephone Encounter (Signed)
You can give verbal orders approving occupational therapy for the patient.

## 2021-03-16 NOTE — H&P (Addendum)
History and Physical    Sharan Mcenaney SLH:[id number] DOB: 1933-08-16 DOA: 03/16/2021  PCP: [name], MD   Patient coming from: SNF  I have personally briefly reviewed patient's old medical records in [place]  Chief Complaint: syncope  HPI: Brianna Dodson is a 87 y.o. female with medical history significant for ESRD on HD MWF with anemia of CKD, paroxysmal A. fib on Eliquis, DM Alzheimer's disease, OSA, HTN, DM, history of recurrent strokes, most recently on 02/25/2021 with MRI showing acute right parietal lobe infarct and right MCA stroke in March 2022 with residual left-sided weakness who presents to the ED following a syncopal episode at home while on the commode.  Most of the history is given by the granddaughter at the bedside who states that patient was in her usual state of health prior to going to dialysis earlier but on her return she had to be wheeled into the home whereas usually she is able to use a walker.  Later on when she was using the commode, she called out for help and when the granddaughter went to see her she appeared slumped over and though awake was not answering verbally when her granddaughter called her name several times.  She finally came around but the episode happened again and they called EMS.  While being evaluated in the ED with a chest x-ray she had another syncopal event that was short-lived and improved after she was moved from the wheelchair to the stretcher.  History is limited due to dementia but patient denies feeling unwell.  Denies chest pain, shortness of breath, headache or blurred vision or weakness.  Denies cough.  Denies abdominal pain, nausea vomiting or change in bowel habits. Patient was hospitalized from 8/26-9/1 with problems with AV graft in her left arm complicated by weakness of the left hand.  MRI showed a stroke though this was thought unlikely by neurology.  ED course: On arrival BP 95/53 with otherwise normal vitals Blood work  significant for hemoglobin of 8.7 which appears to be her most recent baseline in the past 2 weeks.  Potassium of 3.  Troponin 15  Chest x-ray: Low lung volumes with mild right infrahilar and left basilar linear scarring and/or atelectasis  EKG, personally viewed and interpreted: NSR at 67 with nonspecific ST-T wave changes  The emergency room provider spoke with nephrologist, Dr. Juleen China who recommended gentle hydration for correction of possible over dialysis.  Hospitalist consulted for admission.  Review of Systems: Unreliable due to history of dementia   Past Medical History:  Diagnosis Date   A-fib ([place])    Alzheimer disease ([place])    Anemia    Carotid stenosis    Cerebral infarction ([employer]) 04/12/2019   Multiple new foci of acute infarction affect the RIGHT hemisphere affecting the frontal, posterior frontal, posterior temporal, anterior parietal cortex and regional white matter.   Cerebral infarction due to embolism of right middle cerebral artery ([place]) 08/20/2019   RIGHT MCA distal M1   Chronic anticoagulation    Apixaban   CKD (chronic kidney disease), stage 4(HCC)    Embolic stroke involving cerebral artery ([place]) 03/15/2019   RIGHT MCA/PCA territory   Encephalomalacia    RIGHT posterior MCA territory   HCV (hepatitis C virus)    History of blood transfusion    Hyperlipidemia    Hypertension    OSA (obstructive sleep apnea)    Status post placement of implantable loop recorder 07/30/2019   T2DM (type 2 diabetes mellitus) ([place])  Past Surgical History:  Procedure Laterality Date   ABDOMINAL HYSTERECTOMY  07/04/1983   AV FISTULA PLACEMENT Left 12/02/2020   Procedure: INSERTION OF ARTERIOVENOUS (AV)  FISTULA  ARM ( BRACHIO- CEPHALIC);  Surgeon: Algernon Huxley, MD;  Location: [facility] ORS;  Service: Vascular;  Laterality: Left;   AV FISTULA PLACEMENT Left 02/24/2021   Procedure: INSERTION OF ARTERIOVENOUS (AV) GORE-TEX GRAFT ARM;  Surgeon: Algernon Huxley, MD;  Location: [facility]  ORS;  Service: Vascular;  Laterality: Left;  Left brachial Ax graft   CATARACT EXTRACTION, BILATERAL Bilateral    DIALYSIS/PERMA CATHETER INSERTION N/A 10/06/2020   Procedure: DIALYSIS/PERMA CATHETER INSERTION;  Surgeon: Algernon Huxley, MD;  Location: [place] CV LAB;  Service: Cardiovascular;  Laterality: N/A;   LIGATION OF ARTERIOVENOUS  FISTULA Left 02/26/2021   Procedure: LIGATION OF ARTERIOVENOUS  FISTULA;  Surgeon: Bertram Savin, MD;  Location: [facility] ORS;  Service: Vascular;  Laterality: Left;   LOOP RECORDER INSERTION N/A 07/30/2019   Procedure: LOOP RECORDER INSERTION;  Surgeon: Isaias Cowman, MD;  Location: [place] CV LAB;  Service: Cardiovascular;  Laterality: N/A;   TEE WITHOUT CARDIOVERSION N/A 04/14/2019   Procedure: TRANSESOPHAGEAL ECHOCARDIOGRAM (TEE);  Surgeon: Teodoro Spray, MD;  Location: [facility] ORS;  Service: Cardiovascular;  Laterality: N/A;     reports that she has never smoked. She has never used smokeless tobacco. She reports that she does not drink alcohol and does not use drugs.  Allergies  Allergen Reactions   Nsaids     CKD stage III - Avoid all nephrotoxic drugs    Family History  Problem Relation Age of Onset   Stroke Mother    Diabetes Mother    Heart disease Father    Kidney disease Sister    Diabetes Sister    Kidney disease Brother    Heart disease Sister    Diabetes Sister    Diabetes Sister    Diabetes Sister    Kidney disease Sister    Heart disease Sister    Kidney disease Brother        kidney transplant   Early death Brother 52       Truck Accident - died   Heart disease Brother       Prior to Admission medications   Medication Sig Start Date End Date Taking? Authorizing Provider  acetaminophen (TYLENOL) 325 MG tablet Take 2 tablets (650 mg total) by mouth every 6 (six) hours as needed for mild pain, fever or moderate pain. 03/03/21   Florencia Reasons, MD  amLODipine (NORVASC) 10 MG tablet Take 0.5 tablets (5 mg total) by mouth  daily. 01/25/21   [name], MD  amLODipine (NORVASC) 5 MG tablet Take 5 mg by mouth daily. 03/04/21   [provider]  ascorbic acid (VITAMIN C) 500 MG tablet Take 1 tablet (500 mg total) by mouth daily. 03/04/21   Florencia Reasons, MD  atorvastatin (LIPITOR) 80 MG tablet Take 1 tablet (80 mg total) by mouth daily. 10/14/20   [name], MD  carvedilol (COREG) 6.25 MG tablet TAKE 1 TABLET (6.25 MG TOTAL) BY MOUTH 2 (TWO) TIMES DAILY WITH A MEAL. 10/07/20   [name], MD  doxycycline (VIBRAMYCIN) 100 MG capsule Take 1 capsule (100 mg total) by mouth 2 (two) times daily. 03/11/21   Kris Hartmann, NP  ELIQUIS 2.5 MG TABS tablet Take 1 tablet (2.5 mg total) by mouth 2 (two) times daily. 10/07/20   [name], MD  ezetimibe Chauncey Cruel)  10 MG tablet TAKE 1 TABLET BY MOUTH EVERY DAY 12/06/20   [name], MD  Ferrous Sulfate (IRON) 28 MG TABS Take 1 tablet by mouth daily in the afternoon.    [provider]  gabapentin (NEURONTIN) 100 MG capsule Take 1 capsule (100 mg total) by mouth 3 (three) times a week. Take after dialysis. 03/16/21   [name], MD  glucose blood (ACCU-CHEK GUIDE) test strip USE TO CHECK BLOOD SUGARS TWICE DAILY. E11.9 12/27/20   [name], MD  insulin aspart (NOVOLOG) 100 UNIT/ML injection Inject 0-9 Units into the skin 3 (three) times daily with meals. Before each meal 3 times a day, 140-199 - 2 units, 200-250 - 4 units, 251-299 - 6 units,  300-349 - 8 units,  350 or above 10 units. Insulin PEN if approved, provide syringes and needles if needed. Patient not taking: Reported on 03/15/2021 03/03/21   Florencia Reasons, MD  Lancets [facility] ULTRASOFT) lancets Use as instructed. Use patient insurance preferred brand. 12/27/20   [name], MD  linagliptin (TRADJENTA) 5 MG TABS tablet Take 1 tablet (5 mg total) by mouth daily. 12/23/20   [name], MD  liver oil-zinc oxide (DESITIN) 40 % ointment Apply topically as needed for  irritation. 03/03/21   Florencia Reasons, MD  mirtazapine (REMERON) 7.5 MG tablet Take 7.5 mg by mouth at bedtime. 01/10/21   [provider]  multivitamin (RENA-VIT) TABS tablet Take 1 tablet by mouth at bedtime. 03/03/21   Florencia Reasons, MD  Nutritional Supplements (FEEDING SUPPLEMENT, NEPRO CARB STEADY,) LIQD Take 237 mLs by mouth 2 (two) times daily between meals. 03/04/21   Florencia Reasons, MD  senna-docusate (SENOKOT-S) 8.6-50 MG tablet Take 1 tablet by mouth at bedtime as needed for mild constipation. 03/03/21   Florencia Reasons, MD  silver sulfADIAZINE (SILVADENE) 1 % cream Apply 1 application topically daily. 03/11/21   Kris Hartmann, NP  Vitamin D, Ergocalciferol, (DRISDOL) 50000 units CAPS capsule Take 50,000 Units by mouth every 30 (thirty) days. 10/05/17   [provider]    Physical Exam: Vitals:   03/16/21 2134 03/16/21 2141 03/16/21 2142  BP:   (!) 95/53  Pulse:   70  Resp:   15  Temp:   98.9 F (37.2 C)  TempSrc:   Oral  SpO2: 99%  100%  Weight:  64 kg   Height:  5\' 5"  (1.651 m)      Vitals:   03/16/21 2134 03/16/21 2141 03/16/21 2142  BP:   (!) 95/53  Pulse:   70  Resp:   15  Temp:   98.9 F (37.2 C)  TempSrc:   Oral  SpO2: 99%  100%  Weight:  64 kg   Height:  5\' 5"  (1.651 m)       Constitutional: Alert and oriented x 2 . Not in any apparent distress HEENT:      Head: Normocephalic and atraumatic.         Eyes: PERLA, EOMI, Conjunctivae are normal. Sclera is non-icteric.       Mouth/Throat: Mucous membranes are moist.       Neck: Supple with no signs of meningismus. Cardiovascular: Regular rate and rhythm. No murmurs, gallops, or rubs. 2+ symmetrical distal pulses are present . No JVD. No LE edema Respiratory: Respiratory effort normal .Lungs sounds clear bilaterally. No wheezes, crackles, or rhonchi.  Gastrointestinal: Soft, non tender, and non distended with positive bowel sounds.  Genitourinary: No CVA tenderness. Musculoskeletal: Left  wrist drop.  Bandages on left  forearm at site of AV graft  neurologic:  Face is symmetric. Moving all extremities. No gross focal neurologic deficits except for left wrist drop and weak left grip strength. Skin: Skin is warm, dry.  No rash or ulcers Psychiatric: Mood and affect are normal    Labs on Admission: I have personally reviewed following labs and imaging studies  CBC: Recent Labs  Lab 03/16/21 2245  WBC 3.8*  HGB 8.7*  HCT 25.3*  MCV 91.7  PLT 122   Basic Metabolic Panel: Recent Labs  Lab 03/16/21 2146  NA 136  K 3.0*  CL 93*  CO2 32  GLUCOSE 148*  BUN 9  CREATININE 2.02*  CALCIUM 7.9*   GFR: Estimated Creatinine Clearance: 18.3 mL/min (A) (by C-G formula based on SCr of 2.02 mg/dL (H)). Liver Function Tests: No results for input(s): AST, ALT, ALKPHOS, BILITOT, PROT, ALBUMIN in the last 168 hours. No results for input(s): LIPASE, AMYLASE in the last 168 hours. No results for input(s): AMMONIA in the last 168 hours. Coagulation Profile: No results for input(s): INR, PROTIME in the last 168 hours. Cardiac Enzymes: No results for input(s): CKTOTAL, CKMB, CKMBINDEX, TROPONINI in the last 168 hours. BNP (last 3 results) No results for input(s): PROBNP in the last 8760 hours. HbA1C: No results for input(s): HGBA1C in the last 72 hours. CBG: No results for input(s): GLUCAP in the last 168 hours. Lipid Profile: No results for input(s): CHOL, HDL, LDLCALC, TRIG, CHOLHDL, LDLDIRECT in the last 72 hours. Thyroid Function Tests: No results for input(s): TSH, T4TOTAL, FREET4, T3FREE, THYROIDAB in the last 72 hours. Anemia Panel: No results for input(s): VITAMINB12, FOLATE, FERRITIN, TIBC, IRON, RETICCTPCT in the last 72 hours. Urine analysis:    Component Value Date/Time   COLORURINE YELLOW 09/10/2020 2141   APPEARANCEUR HAZY (A) 09/10/2020 2141   APPEARANCEUR Clear 11/09/2013 1902   LABSPEC 1.011 09/10/2020 2141   LABSPEC 1.011 11/09/2013 1902   PHURINE 7.0 09/10/2020 2141   GLUCOSEU  NEGATIVE 09/10/2020 2141   GLUCOSEU >=1000 (A) 01/06/2016 0923   HGBUR NEGATIVE 09/10/2020 2141   BILIRUBINUR NEGATIVE 09/10/2020 2141   BILIRUBINUR neg 06/09/2020 1247   BILIRUBINUR Negative 11/09/2013 1902   KETONESUR NEGATIVE 09/10/2020 2141   PROTEINUR >=300 (A) 09/10/2020 2141   UROBILINOGEN 0.2 06/09/2020 1247   UROBILINOGEN 0.2 01/06/2016 0923   NITRITE NEGATIVE 09/10/2020 2141   LEUKOCYTESUR MODERATE (A) 09/10/2020 2141   LEUKOCYTESUR Trace 11/09/2013 1902    Radiological Exams on Admission: DG Chest Port 1 View  Result Date: 03/16/2021 CLINICAL DATA:  Syncopal episode. EXAM: PORTABLE CHEST 1 VIEW COMPARISON:  Nov 09, 2013 FINDINGS: A right-sided venous catheter is seen with its distal tip noted within the right atrium. Its position is difficult to determine due to a markedly elevated right hemidiaphragm. Low lung volumes are seen. Mild right infrahilar linear scarring and/or atelectasis is noted with a trace amount of linear scarring and/or atelectasis also seen along the lateral aspect of the left lung base. There is no evidence of a pleural effusion or pneumothorax. The heart size and mediastinal contours are within normal limits. A radiopaque loop recorder device is seen overlying the lateral aspect of the left lung base. Degenerative changes seen throughout the thoracic spine. A deformity of indeterminate age is seen involving the inferior aspect of the left glenoid. IMPRESSION: 1. Low lung volumes with mild right infrahilar and left basilar linear scarring and/or atelectasis. Electronically Signed   By: Hoover Browns  [place] M.D.   On: 03/16/2021 22:46     Assessment/Plan 87 year old female with history of ESRD on HD MWF with anemia of CKD, paroxysmal A. fib on Eliquis, DM Alzheimer's disease, OSA, HTN, DM, recurrent strokes, presenting with syncopal episodes x2.     Syncope , ?TIA   Hypotension - Patient presents with syncope x2 following dialysis session  - Possibly related to  fluid removal during dialysis, possible TIA - See below for management of potential etiologies  Hypotension with history of essential hypertension -BP 95/53 in the ED - Hold amlodipine and Coreg - Gentle IV hydration - Repeat IV fluid bolus as needed to maintain MAP over 65  Possible TIA in the setting of history of recurrent strokes with current residual left hemiparesis - History of R MCA stroke 08/2020, ,  right parietal lobe infarct 01/2021 - Neurologic checks and fall precautions - We will get CT head -Patient had echo in March 2022, carotid Doppler 01/2021 with no significant stenosis.  Will not repeat - Continuous cardiac monitoring - Neurology consult - Continue atorvastatin and Eliquis    Atrial fibrillation (HCC)   Chronic anticoagulation - Continue Eliquis - Holding carvedilol due to hypotension    Type II diabetes mellitus with renal manifestations (HCC) - Sliding scale insulin coverage    Anemia in ESRD (end-stage renal disease) (HCC) - Hemoglobin stable at baseline of 8.7    Mixed Alzheimer's and vascular dementia ([place]) - Continue mirtazapine    End stage renal disease on dialysis [facility]) --nephrology consult for continuation of dialysis    DVT prophylaxis: eliquis  Code Status: full code  Family Communication: Granddaughter and sister at bedside Disposition Plan: Back to previous home environment Consults called: nephrology, neurology Status:observation    Athena Masse MD [facility]     03/16/2021, 11:56 PM

## 2021-03-16 NOTE — ED Triage Notes (Signed)
Pt had a syncopal episode on the toilet today after dialysis.  No chest pain or sob. No n/v/d  pt alert speech clear.

## 2021-03-16 NOTE — Telephone Encounter (Signed)
Placed call to [facility]. Verbal order given.

## 2021-03-16 NOTE — ED Triage Notes (Signed)
EMS brings pt in from home; syncopal episode while on commode; dialysis completed today

## 2021-03-16 NOTE — ED Notes (Signed)
Pt brought to nurses station by x-ray tech who was in process of taking pt via wheelchair for chest x-ray when pt became unresponsive.  Pt was sternal rubbed and pulse checked.  Pt lifted to stretcher and became more alert speaking with staff.  Hooked up to monitor and assessed by EDP . Pt was alert and oriented at that time.

## 2021-03-16 NOTE — ED Provider Notes (Signed)
[facility] Emergency Department Provider Note    ____________________________________________   I have reviewed the triage vital signs and the nursing notes.   HISTORY  Chief Complaint Loss of Consciousness   History limited by: Not Limited   HPI Brianna Dodson is a 87 y.o. female who presents to the emergency department today because of concerns for a syncopal episode.  Apparently the patient was sitting on the toilet when it occurred.  Patient does have a history of end-stage renal disease and is on dialysis.  She states she had her normal dialysis session today.  The patient had an x-ray ordered at triage and when she was sent to have x-ray she passed out again.  She arrived to the room in the emergency department in wheelchair.  On initial assessment she was slightly responsive to sternal rubs.  However once we got her on the stretcher and lying flat she became much more responsive. She denies any headache. Denies any chest pain or shortness of breath.   Records reviewed. Per medical record review patient has a history of alzheimer's disease, CKD.   Past Medical History:  Diagnosis Date   A-fib ([place])    Alzheimer disease ([place])    Anemia    Carotid stenosis    Cerebral infarction ([place]) 04/12/2019   Multiple new foci of acute infarction affect the RIGHT hemisphere affecting the frontal, posterior frontal, posterior temporal, anterior parietal cortex and regional white matter.   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/2019   RIGHT MCA distal M1   Chronic anticoagulation    Apixaban   CKD (chronic kidney disease), stage 4(HCC)    Embolic stroke involving cerebral artery ([place]) 03/15/2019   RIGHT MCA/PCA territory   Encephalomalacia    RIGHT posterior MCA territory   HCV (hepatitis C virus)    History of blood transfusion    Hyperlipidemia    Hypertension    OSA (obstructive sleep apnea)    Status post  placement of implantable loop recorder 07/30/2019   T2DM (type 2 diabetes mellitus) ([place])     Patient Active Problem List   Diagnosis Date Noted   Pressure injury of sacral region, stage 2 ([place]) 03/15/2021   Renal dialysis device, implant, or graft complication    Left arm pain 02/25/2021   Thrombocytopenia ([place]) 02/25/2021   Other disorders of phosphorus metabolism 01/12/2021   Coagulation defect, unspecified ([place]) 10/12/2020   Diarrhea, unspecified 10/12/2020   Encounter for immunization 10/12/2020   End stage renal disease on dialysis ([place]) 10/12/2020   Fever, unspecified 10/12/2020   Headache, unspecified 10/12/2020   Iron deficiency anemia, unspecified 10/12/2020   Localized edema 10/12/2020   Nontoxic single thyroid nodule 10/12/2020   Occlusion and stenosis of unspecified carotid artery 10/12/2020   Other fluid overload 10/12/2020   Pain, unspecified 10/12/2020   Shortness of breath 10/12/2020   Vascular dementia without behavioral disturbance ([place]) 10/12/2020   Malnutrition of moderate degree 09/13/2020   Anemia of chronic disease 09/10/2020   Sleeping difficulty 07/08/2020   Fatigue 06/09/2020   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   Coccydynia 01/26/2020   Leukopenia 12/08/2019   Muscle strain 12/08/2019   Pedal edema 11/26/2019   Dysarthria and anarthria 11/07/2019   Atrial fibrillation ([place]) 10/21/2019   Nocturia 10/21/2019   Seborrheic keratosis 10/21/2019   Carotid stenosis 08/27/2019   Thyroid nodule 08/27/2019   Status post placement of implantable loop recorder 08/07/2019   Anemia in  ESRD (end-stage renal disease) ([place]) 04/18/2019   Aphasia 04/12/2019   Benign hypertensive renal disease 04/07/2019   Hyperparathyroidism due to renal insufficiency ([place]) 04/07/2019   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, or unspecified 04/07/2019   Proteinuria 04/07/2019   History of stroke with  current residual effects 03/15/2019   Hyperlipidemia associated with type 2 diabetes mellitus ([place]) 07/25/2018   Itching 10/23/2017   Nevus 04/17/2017   CKD (chronic kidney disease), stage V ([place]) 02/02/2015   Vitamin D deficiency 02/02/2015   Obesity (BMI 30-39.9) 08/03/2014   Type II diabetes mellitus with renal manifestations ([place]) 01/29/2014   Essential hypertension 01/13/2014   HLD (hyperlipidemia) 01/13/2014    Past Surgical History:  Procedure Laterality Date   ABDOMINAL HYSTERECTOMY  07/04/1983   AV FISTULA PLACEMENT Left 12/02/2020   Procedure: INSERTION OF ARTERIOVENOUS (AV)  FISTULA  ARM ( BRACHIO- CEPHALIC);  Surgeon: Algernon Huxley, MD;  Location: [facility] ORS;  Service: Vascular;  Laterality: Left;   AV FISTULA PLACEMENT Left 02/24/2021   Procedure: INSERTION OF ARTERIOVENOUS (AV) GORE-TEX GRAFT ARM;  Surgeon: Algernon Huxley, MD;  Location: [facility] ORS;  Service: Vascular;  Laterality: Left;  Left brachial Ax graft   CATARACT EXTRACTION, BILATERAL Bilateral    DIALYSIS/PERMA CATHETER INSERTION N/A 10/06/2020   Procedure: DIALYSIS/PERMA CATHETER INSERTION;  Surgeon: Algernon Huxley, MD;  Location: [place] [facility];  Service: Cardiovascular;  Laterality: N/A;   LIGATION OF ARTERIOVENOUS  FISTULA Left 02/26/2021   Procedure: LIGATION OF ARTERIOVENOUS  FISTULA;  Surgeon: Bertram Savin, MD;  Location: [facility] ORS;  Service: Vascular;  Laterality: Left;   LOOP RECORDER INSERTION N/A 07/30/2019   Procedure: LOOP RECORDER INSERTION;  Surgeon: Isaias Cowman, MD;  Location: [place] [facility];  Service: Cardiovascular;  Laterality: N/A;   TEE WITHOUT CARDIOVERSION N/A 04/14/2019   Procedure: TRANSESOPHAGEAL ECHOCARDIOGRAM (TEE);  Surgeon: Teodoro Spray, MD;  Location: [facility] ORS;  Service: Cardiovascular;  Laterality: N/A;    Prior to Admission medications   Medication Sig Start Date End Date Taking? Authorizing Provider  acetaminophen (TYLENOL) 325 MG tablet Take 2 tablets (650 mg  total) by mouth every 6 (six) hours as needed for mild pain, fever or moderate pain. 03/03/21   Florencia Reasons, MD  amLODipine (NORVASC) 10 MG tablet Take 0.5 tablets (5 mg total) by mouth daily. 01/25/21   [name], MD  amLODipine (NORVASC) 5 MG tablet Take 5 mg by mouth daily. 03/04/21   [provider]  ascorbic acid (VITAMIN C) 500 MG tablet Take 1 tablet (500 mg total) by mouth daily. 03/04/21   Florencia Reasons, MD  atorvastatin (LIPITOR) 80 MG tablet Take 1 tablet (80 mg total) by mouth daily. 10/14/20   [name], MD  carvedilol (COREG) 6.25 MG tablet TAKE 1 TABLET (6.25 MG TOTAL) BY MOUTH 2 (TWO) TIMES DAILY WITH A MEAL. 10/07/20   [name], MD  doxycycline (VIBRAMYCIN) 100 MG capsule Take 1 capsule (100 mg total) by mouth 2 (two) times daily. 03/11/21   Kris Hartmann, NP  ELIQUIS 2.5 MG TABS tablet Take 1 tablet (2.5 mg total) by mouth 2 (two) times daily. 10/07/20   [name], MD  ezetimibe (ZETIA) 10 MG tablet TAKE 1 TABLET BY MOUTH EVERY DAY 12/06/20   [name], MD  Ferrous Sulfate (IRON) 28 MG TABS Take 1 tablet by mouth daily in the afternoon.    [provider]  gabapentin (  NEURONTIN) 100 MG capsule Take 1 capsule (100 mg total) by mouth 3 (three) times a week. Take after dialysis. 03/16/21   [name], MD  glucose blood (ACCU-CHEK GUIDE) test strip USE TO CHECK BLOOD SUGARS TWICE DAILY. E11.9 12/27/20   [name], MD  insulin aspart (NOVOLOG) 100 UNIT/ML injection Inject 0-9 Units into the skin 3 (three) times daily with meals. Before each meal 3 times a day, 140-199 - 2 units, 200-250 - 4 units, 251-299 - 6 units,  300-349 - 8 units,  350 or above 10 units. Insulin PEN if approved, provide syringes and needles if needed. Patient not taking: Reported on 03/15/2021 03/03/21   Florencia Reasons, MD  Lancets [facility] ULTRASOFT) lancets Use as instructed. Use patient insurance preferred brand. 12/27/20   [name], MD  linagliptin  (TRADJENTA) 5 MG TABS tablet Take 1 tablet (5 mg total) by mouth daily. 12/23/20   [name], MD  liver oil-zinc oxide (DESITIN) 40 % ointment Apply topically as needed for irritation. 03/03/21   Florencia Reasons, MD  mirtazapine (REMERON) 7.5 MG tablet Take 7.5 mg by mouth at bedtime. 01/10/21   [provider]  multivitamin (RENA-VIT) TABS tablet Take 1 tablet by mouth at bedtime. 03/03/21   Florencia Reasons, MD  Nutritional Supplements (FEEDING SUPPLEMENT, NEPRO CARB STEADY,) LIQD Take 237 mLs by mouth 2 (two) times daily between meals. 03/04/21   Florencia Reasons, MD  senna-docusate (SENOKOT-S) 8.6-50 MG tablet Take 1 tablet by mouth at bedtime as needed for mild constipation. 03/03/21   Florencia Reasons, MD  silver sulfADIAZINE (SILVADENE) 1 % cream Apply 1 application topically daily. 03/11/21   Kris Hartmann, NP  Vitamin D, Ergocalciferol, (DRISDOL) 50000 units CAPS capsule Take 50,000 Units by mouth every 30 (thirty) days. 10/05/17   [provider]    Allergies Nsaids  Family History  Problem Relation Age of Onset   Stroke Mother    Diabetes Mother    Heart disease Father    Kidney disease Sister    Diabetes Sister    Kidney disease Brother    Heart disease Sister    Diabetes Sister    Diabetes Sister    Diabetes Sister    Kidney disease Sister    Heart disease Sister    Kidney disease Brother        kidney transplant   Early death Brother 53       Truck Accident - died   Heart disease Brother     Social History Social History   Tobacco Use   Smoking status: Never   Smokeless tobacco: Never  Vaping Use   Vaping Use: Never used  Substance Use Topics   Alcohol use: No   Drug use: No    Review of Systems Constitutional: No fever/chills Eyes: No visual changes. ENT: No sore throat. Cardiovascular: Denies chest pain. Respiratory: Denies shortness of breath. Gastrointestinal: No abdominal pain.  No nausea, no vomiting.  No diarrhea.   Genitourinary: Negative for  dysuria. Musculoskeletal: Negative for back pain. Skin: Negative for rash. Neurological: Positive for syncopal episodes.   ____________________________________________   PHYSICAL EXAM:  VITAL SIGNS: ED Triage Vitals  Enc Vitals Group     BP 03/16/21 2142 (!) 95/53     Pulse Rate 03/16/21 2142 70     Resp 03/16/21 2142 15     Temp 03/16/21 2142 98.9 F (37.2 C)     Temp Source 03/16/21 2142 Oral  SpO2 03/16/21 2134 99 %     Weight 03/16/21 2141 141 lb (64 kg)     Height 03/16/21 2141 5\' 5"  (1.651 m)     Head Circumference --      Peak Flow --      Pain Score 03/16/21 2141 0   Constitutional: Awake and alert.  Eyes: Conjunctivae are normal.  ENT      Head: Normocephalic and atraumatic.      Nose: No congestion/rhinnorhea.      Mouth/Throat: Mucous membranes are moist.      Neck: No stridor. Hematological/Lymphatic/Immunilogical: No cervical lymphadenopathy. Cardiovascular: Normal rate, regular rhythm.  No murmurs, rubs, or gallops.  Respiratory: Normal respiratory effort without tachypnea nor retractions. Breath sounds are clear and equal bilaterally. No wheezes/rales/rhonchi. Gastrointestinal: Soft and non tender. No rebound. No guarding.  Genitourinary: Deferred Musculoskeletal: Normal range of motion in all extremities. No lower extremity edema. Neurologic:  Dementia. Awake and alert.  Skin:  Skin is warm, dry and intact. No rash noted. Psychiatric: Mood and affect are normal. Speech and behavior are normal. Patient exhibits appropriate insight and judgment.  ____________________________________________    LABS (pertinent positives/negatives)  CBC wbc 3.8, hgb 8.7, plt 159 Trop hs 15 BMP na 136, k 3.0, glu 148, cr 2.02  ____________________________________________   EKG  I, Nance Pear, attending physician, personally viewed and interpreted this EKG  EKG Time: 2149 Rate: 67 Rhythm: normal sinus rhythm Axis: left axis deviation Intervals: qtc  479 QRS: low voltage qrs ST changes: no st elevation Impression: abnormal ekg ____________________________________________    RADIOLOGY  CXR Low lung volumes with atelectasis.   ____________________________________________   PROCEDURES  Procedures  ____________________________________________   INITIAL IMPRESSION / ASSESSMENT AND PLAN / ED COURSE  Pertinent labs & imaging results that were available during my care of the patient were reviewed by me and considered in my medical decision making (see chart for details).   Patient presented to the emergency department today after a syncopal episode.  She then had another syncopal type episode while in x-ray.  Once we were able to lie her flat her mentation did come back to her baseline.  She was however found to be hypotensive.  Patient does have dialysis and had dialysis today.  Additionally potassium was low.  I discussed with Dr. Juleen China with nephrology.  He does wonder if patient was over dialyzed, apparently she has had some weight loss recently he is not sure that dialysis is been appropriately readjusted.  Will plan on admission to the hospitalist service.  Will start small amount of fluids.  ___________________________________________   FINAL CLINICAL IMPRESSION(S) / ED DIAGNOSES  Final diagnoses:  Syncope     Note: This dictation was prepared with Dragon dictation. Any transcriptional errors that result from this process are unintentional     Nance Pear, MD 03/16/21 [phone]

## 2021-03-17 ENCOUNTER — Observation Stay: Payer: Medicare Other

## 2021-03-17 DIAGNOSIS — I10 Essential (primary) hypertension: Secondary | ICD-10-CM | POA: Diagnosis not present

## 2021-03-17 DIAGNOSIS — Z992 Dependence on renal dialysis: Secondary | ICD-10-CM

## 2021-03-17 DIAGNOSIS — I120 Hypertensive chronic kidney disease with stage 5 chronic kidney disease or end stage renal disease: Secondary | ICD-10-CM | POA: Diagnosis not present

## 2021-03-17 DIAGNOSIS — I4891 Unspecified atrial fibrillation: Secondary | ICD-10-CM | POA: Diagnosis not present

## 2021-03-17 DIAGNOSIS — R55 Syncope and collapse: Secondary | ICD-10-CM | POA: Diagnosis not present

## 2021-03-17 DIAGNOSIS — Z7901 Long term (current) use of anticoagulants: Secondary | ICD-10-CM | POA: Diagnosis not present

## 2021-03-17 DIAGNOSIS — D631 Anemia in chronic kidney disease: Secondary | ICD-10-CM | POA: Diagnosis not present

## 2021-03-17 DIAGNOSIS — N2581 Secondary hyperparathyroidism of renal origin: Secondary | ICD-10-CM | POA: Diagnosis not present

## 2021-03-17 DIAGNOSIS — N186 End stage renal disease: Secondary | ICD-10-CM

## 2021-03-17 DIAGNOSIS — I6930 Unspecified sequelae of cerebral infarction: Secondary | ICD-10-CM | POA: Diagnosis not present

## 2021-03-17 DIAGNOSIS — I6501 Occlusion and stenosis of right vertebral artery: Secondary | ICD-10-CM | POA: Diagnosis not present

## 2021-03-17 DIAGNOSIS — R9082 White matter disease, unspecified: Secondary | ICD-10-CM | POA: Diagnosis not present

## 2021-03-17 DIAGNOSIS — R29818 Other symptoms and signs involving the nervous system: Secondary | ICD-10-CM | POA: Diagnosis not present

## 2021-03-17 LAB — BASIC METABOLIC PANEL
Anion gap: 10 (ref 5–15)
BUN: 13 mg/dL (ref 8–23)
CO2: 33 mmol/L — ABNORMAL HIGH (ref 22–32)
Calcium: 8 mg/dL — ABNORMAL LOW (ref 8.9–10.3)
Chloride: 97 mmol/L — ABNORMAL LOW (ref 98–111)
Creatinine, Ser: 2.52 mg/dL — ABNORMAL HIGH (ref 0.44–1.00)
GFR, Estimated: 18 mL/min — ABNORMAL LOW (ref >60)
Glucose, Bld: 108 mg/dL — ABNORMAL HIGH (ref 70–99)
Potassium: 3.3 mmol/L — ABNORMAL LOW (ref 3.5–5.1)
Sodium: 140 mmol/L (ref 135–145)

## 2021-03-17 LAB — RESP PANEL BY RT-PCR (FLU A&B, COVID) ARPGX2
Influenza A by PCR: NEGATIVE
Influenza B by PCR: NEGATIVE
SARS Coronavirus 2 by RT PCR: NEGATIVE

## 2021-03-17 LAB — CBC
HCT: 21.6 % — ABNORMAL LOW (ref 36.0–46.0)
Hemoglobin: 7.4 g/dL — ABNORMAL LOW (ref 12.0–15.0)
MCH: 30.8 pg (ref 26.0–34.0)
MCHC: 34.3 g/dL (ref 30.0–36.0)
MCV: 90 fL (ref 80.0–100.0)
Platelets: 147 10*3/uL — ABNORMAL LOW (ref 150–400)
RBC: 2.4 MIL/uL — ABNORMAL LOW (ref 3.87–5.11)
RDW: 14.6 % (ref 11.5–15.5)
WBC: 3.2 10*3/uL — ABNORMAL LOW (ref 4.0–10.5)
nRBC: 0 % (ref 0.0–0.2)

## 2021-03-17 LAB — TROPONIN I (HIGH SENSITIVITY): Troponin I (High Sensitivity): 13 ng/L (ref <18)

## 2021-03-17 LAB — CBG MONITORING, ED
Glucose-Capillary: 148 mg/dL — ABNORMAL HIGH (ref 70–99)
Glucose-Capillary: 116 mg/dL — ABNORMAL HIGH (ref 70–99)
Glucose-Capillary: 62 mg/dL — ABNORMAL LOW (ref 70–99)
Glucose-Capillary: 150 mg/dL — ABNORMAL HIGH (ref 70–99)

## 2021-03-17 LAB — GLUCOSE, CAPILLARY: Glucose-Capillary: 113 mg/dL — ABNORMAL HIGH (ref 70–99)

## 2021-03-17 MED ORDER — ACETAMINOPHEN 650 MG RE SUPP: 650.0000 mg | Freq: Four times a day (QID) | RECTAL | Status: DC | PRN

## 2021-03-17 MED ORDER — ACETAMINOPHEN 325 MG PO TABS
650.0000 mg | ORAL_TABLET | Freq: Four times a day (QID) | ORAL | Status: DC | PRN
  Administered 2021-03-18: 650 mg via ORAL
  Filled 2021-03-17: qty 2

## 2021-03-17 MED ORDER — ALPRAZOLAM 0.25 MG PO TABS
0.2500 mg | ORAL_TABLET | Freq: Once | ORAL | Status: AC
  Administered 2021-03-17: 0.25 mg via ORAL
  Filled 2021-03-17: qty 1

## 2021-03-17 MED ORDER — POTASSIUM CHLORIDE CRYS ER 20 MEQ PO TBCR
40.0000 meq | EXTENDED_RELEASE_TABLET | Freq: Once | ORAL | Status: AC
  Administered 2021-03-17: 40 meq via ORAL
  Filled 2021-03-17: qty 2

## 2021-03-17 MED ORDER — ONDANSETRON HCL 4 MG/2ML IJ SOLN: 4.0000 mg | Freq: Four times a day (QID) | INTRAMUSCULAR | Status: DC | PRN

## 2021-03-17 MED ORDER — INSULIN ASPART 100 UNIT/ML IJ SOLN: 0.0000 [IU] | Freq: Three times a day (TID) | INTRAMUSCULAR | Status: DC

## 2021-03-17 MED ORDER — APIXABAN 2.5 MG PO TABS
2.5000 mg | ORAL_TABLET | Freq: Two times a day (BID) | ORAL | Status: DC
  Administered 2021-03-17 – 2021-03-18 (×3): 2.5 mg via ORAL
  Filled 2021-03-17 (×4): qty 1

## 2021-03-17 MED ORDER — SODIUM CHLORIDE 0.9% FLUSH
3.0000 mL | Freq: Two times a day (BID) | INTRAVENOUS | Status: DC
  Administered 2021-03-17 (×2): 3 mL via INTRAVENOUS

## 2021-03-17 MED ORDER — INSULIN ASPART 100 UNIT/ML IJ SOLN: 0.0000 [IU] | Freq: Every day | INTRAMUSCULAR | Status: DC

## 2021-03-17 MED ORDER — CHLORHEXIDINE GLUCONATE CLOTH 2 % EX PADS
6.0000 | MEDICATED_PAD | Freq: Every day | CUTANEOUS | Status: DC
  Administered 2021-03-18: 6 via TOPICAL

## 2021-03-17 MED ORDER — ONDANSETRON HCL 4 MG PO TABS: 4.0000 mg | ORAL_TABLET | Freq: Four times a day (QID) | ORAL | Status: DC | PRN

## 2021-03-17 NOTE — ED Notes (Signed)
Bg 150

## 2021-03-17 NOTE — ED Notes (Addendum)
Pt with swollen right arm from previous surgery. Pt with some movement, limited due to swelling. Pt with minor skin breakdown on inside of left arm, nephrology MD aware. Nephrology MD also informed of skin breakdown on pt's backside due to dialysis chairs. ( Per pt).  Left arm elevated. Pt turned to left side.

## 2021-03-17 NOTE — Progress Notes (Signed)
A consult was placed to the IV Therapist to place a new iv site;  limited to Right arm only; pt very anxious, not wanting to be "stuck" again.  Daughter at bedside; R arm assessed w ultrasound; poor veins;  attempted x 2,  however pt pulls her arm away and clamps down;  no further attempts made;  RN aware and will notify MD of no access at present.

## 2021-03-17 NOTE — ED Notes (Signed)
Pt clean and dry at this time. 

## 2021-03-17 NOTE — Consult Note (Signed)
NEUROLOGY CONSULTATION NOTE   Date of service: March 17, 2021 Patient Name: Brianna Dodson MRN:  [id number] DOB:  1933/10/17 Reason for consult: syncope vs TIA Requesting physician: Val Riles MD _ _ _   _ __   _ __ _ _  __ __   _ __   __ _  History of Present Illness   Patient is oriented x3 on my examination but is unable to confirm pmhx and states she has no memory of why she is in the hospital.  Per Dr. Ronnie Derby note today, "Brianna Dodson is a 87 y.o. female with medical history significant for ESRD on HD MWF with anemia of CKD, paroxysmal A. fib on Eliquis, DM Alzheimer's disease, OSA, HTN, DM, history of recurrent strokes, most recently on 02/25/2021 with MRI showing acute right parietal lobe infarct and right MCA stroke in March 2022 with residual left-sided weakness who presents to the ED following a syncopal episode at home while on the commode.  Most of the history is given by the granddaughter at the bedside who states that patient was in her usual state of health prior to going to dialysis earlier but on her return she had to be wheeled into the home whereas usually she is able to use a walker.  Later on when she was using the commode, she called out for help and when the granddaughter went to see her she appeared slumped over and though awake was not answering verbally when her granddaughter called her name several times.  She finally came around but the episode happened again and they called EMS.  While being evaluated in the ED with a chest x-ray she had another syncopal event that was short-lived and improved after she was moved from the wheelchair to the stretcher.  History is limited due to dementia but patient denies feeling unwell.  Denies chest pain, shortness of breath, headache or blurred vision or weakness.  Denies cough.  Denies abdominal pain, nausea vomiting or change in bowel habits. Patient was hospitalized from 8/26-9/1 with problems with AV graft in her left arm  complicated by weakness of the left hand.  MRI showed a stroke though this was thought unlikely by neurology.   ED course: On arrival BP 95/53 with otherwise normal vitals Blood work significant for hemoglobin of 8.7 which appears to be her most recent baseline in the past 2 weeks.  Potassium of 3.  Troponin 15   Chest x-ray: Low lung volumes with mild right infrahilar and left basilar linear scarring and/or atelectasis   EKG, personally viewed and interpreted: NSR at 67 with nonspecific ST-T wave changes   The emergency room provider spoke with nephrologist, Dr. Juleen China who recommended gentle hydration for correction of possible over dialysis.  Hospitalist consulted for admission."  Head CT wo contrast  1. No acute intracranial abnormality. 2. Atrophy with chronic small vessel white matter ischemic disease. 3. Old right MCA territory infarct.   ROS   Per HPI; all other systems reviewed and are negative  Past History   Past Medical History:  Diagnosis Date   A-fib ([place])    Alzheimer disease ([place])    Anemia    Carotid stenosis    Cerebral infarction ([place]) 04/12/2019   Multiple new foci of acute infarction affect the RIGHT hemisphere affecting the frontal, posterior frontal, posterior temporal, anterior parietal cortex and regional white matter.   Cerebral infarction due to embolism of right middle cerebral artery ([place]) 08/20/2019   RIGHT MCA distal M1  Chronic anticoagulation    Apixaban   CKD (chronic kidney disease), stage 4(HCC)    Embolic stroke involving cerebral artery ([place]) 03/15/2019   RIGHT MCA/PCA territory   Encephalomalacia    RIGHT posterior MCA territory   HCV (hepatitis C virus)    History of blood transfusion    Hyperlipidemia    Hypertension    OSA (obstructive sleep apnea)    Status post placement of implantable loop recorder 07/30/2019   T2DM (type 2 diabetes mellitus) [facility])    Past Surgical History:  Procedure Laterality Date   ABDOMINAL  HYSTERECTOMY  07/04/1983   AV FISTULA PLACEMENT Left 12/02/2020   Procedure: INSERTION OF ARTERIOVENOUS (AV)  FISTULA  ARM ( BRACHIO- CEPHALIC);  Surgeon: Algernon Huxley, MD;  Location: [facility] ORS;  Service: Vascular;  Laterality: Left;   AV FISTULA PLACEMENT Left 02/24/2021   Procedure: INSERTION OF ARTERIOVENOUS (AV) GORE-TEX GRAFT ARM;  Surgeon: Algernon Huxley, MD;  Location: [facility] ORS;  Service: Vascular;  Laterality: Left;  Left brachial Ax graft   CATARACT EXTRACTION, BILATERAL Bilateral    DIALYSIS/PERMA CATHETER INSERTION N/A 10/06/2020   Procedure: DIALYSIS/PERMA CATHETER INSERTION;  Surgeon: Algernon Huxley, MD;  Location: [place] [facility];  Service: Cardiovascular;  Laterality: N/A;   LIGATION OF ARTERIOVENOUS  FISTULA Left 02/26/2021   Procedure: LIGATION OF ARTERIOVENOUS  FISTULA;  Surgeon: Bertram Savin, MD;  Location: [facility] ORS;  Service: Vascular;  Laterality: Left;   LOOP RECORDER INSERTION N/A 07/30/2019   Procedure: LOOP RECORDER INSERTION;  Surgeon: Isaias Cowman, MD;  Location: [place] [facility];  Service: Cardiovascular;  Laterality: N/A;   TEE WITHOUT CARDIOVERSION N/A 04/14/2019   Procedure: TRANSESOPHAGEAL ECHOCARDIOGRAM (TEE);  Surgeon: Teodoro Spray, MD;  Location: [facility] ORS;  Service: Cardiovascular;  Laterality: N/A;   Family History  Problem Relation Age of Onset   Stroke Mother    Diabetes Mother    Heart disease Father    Kidney disease Sister    Diabetes Sister    Kidney disease Brother    Heart disease Sister    Diabetes Sister    Diabetes Sister    Diabetes Sister    Kidney disease Sister    Heart disease Sister    Kidney disease Brother        kidney transplant   Early death Brother 80       Truck Accident - died   Heart disease Brother    Social History   Socioeconomic History   Marital status: Married    Spouse name: Hubbard Robinson   Number of children: 2   Years of education: Not on file   Highest education level: Not on file  Occupational  History   Occupation: [employer]    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 Retired  Tobacco Use   Smoking status: Never   Smokeless tobacco: Never  Vaping Use   Vaping Use: Never used  Substance and Sexual Activity   Alcohol use: No   Drug use: No   Sexual activity: Not Currently  Other Topics Concern   Not on file  Social History Narrative   She is married and has two adult children (adopted).    Hobbies: Decorating   Exercise: None   Caffeine: 4 cups a week   Social Determinants of Health   Financial Resource Strain: Low Risk    Difficulty of Paying Living Expenses: Not very hard  Food Insecurity: Not on file  Transportation Needs: Not on file  Physical Activity: Not  on file  Stress: Not on file  Social Connections: Not on file   Allergies  Allergen Reactions   Nsaids     CKD stage III - Avoid all nephrotoxic drugs    Medications   Medications Prior to Admission  Medication Sig Dispense Refill Last Dose   acetaminophen (TYLENOL) 325 MG tablet Take 2 tablets (650 mg total) by mouth every 6 (six) hours as needed for mild pain, fever or moderate pain. (Patient taking differently: Take 1,000 mg by mouth every 8 (eight) hours as needed for mild pain, fever or moderate pain.)   prn at prn   amLODipine (NORVASC) 5 MG tablet Take 5 mg by mouth daily.   03/16/2021 at 2100   ascorbic acid (VITAMIN C) 500 MG tablet Take 1 tablet (500 mg total) by mouth daily.   03/16/2021 at am   atorvastatin (LIPITOR) 80 MG tablet Take 1 tablet (80 mg total) by mouth daily. 90 tablet 3 03/16/2021 at pm   carvedilol (COREG) 6.25 MG tablet TAKE 1 TABLET (6.25 MG TOTAL) BY MOUTH 2 (TWO) TIMES DAILY WITH A MEAL. 180 tablet 1 03/16/2021 at 2100   doxycycline (VIBRAMYCIN) 100 MG capsule Take 1 capsule (100 mg total) by mouth 2 (two) times daily. 28 capsule 0 03/16/2021 at unk   ELIQUIS 2.5 MG TABS tablet Take 1 tablet (2.5 mg total) by mouth 2 (two) times daily. 56 tablet 0 03/16/2021 at 2100   ezetimibe (ZETIA)  10 MG tablet TAKE 1 TABLET BY MOUTH EVERY DAY 90 tablet 3 03/16/2021 at 2100   Ferrous Sulfate (IRON) 28 MG TABS Take 1 tablet by mouth daily in the afternoon.   03/16/2021 at unk   gabapentin (NEURONTIN) 100 MG capsule Take 1 capsule (100 mg total) by mouth 3 (three) times a week. Take after dialysis. 36 capsule 1 03/16/2021 at unk   hydrALAZINE (APRESOLINE) 50 MG tablet Take 50 mg by mouth in the morning and at bedtime.   03/16/2021 at 2100   linagliptin (TRADJENTA) 5 MG TABS tablet Take 1 tablet (5 mg total) by mouth daily. 90 tablet 3 03/16/2021 at am   liver oil-zinc oxide (DESITIN) 40 % ointment Apply topically as needed for irritation. 56.7 g 0 prn at prn   mirtazapine (REMERON) 7.5 MG tablet Take 7.5 mg by mouth at bedtime.   03/16/2021 at 2100   multivitamin (RENA-VIT) TABS tablet Take 1 tablet by mouth at bedtime.  0 03/16/2021 at 2100   senna-docusate (SENOKOT-S) 8.6-50 MG tablet Take 1 tablet by mouth at bedtime as needed for mild constipation.   prn at prn   Vitamin D, Ergocalciferol, (DRISDOL) 50000 units CAPS capsule Take 50,000 Units by mouth every 30 (thirty) days.  1 03/03/2021 at unk   amLODipine (NORVASC) 10 MG tablet Take 0.5 tablets (5 mg total) by mouth daily. (Patient not taking: Reported on 03/17/2021) 45 tablet 1 Not Taking at pm   gabapentin (NEURONTIN) 100 MG capsule Take 1 capsule by mouth at bedtime. (Patient not taking: Reported on 03/17/2021)   Not Taking   glucose blood (ACCU-CHEK GUIDE) test strip USE TO CHECK BLOOD SUGARS TWICE DAILY. E11.9 300 each 3    insulin aspart (NOVOLOG) 100 UNIT/ML injection Inject 0-9 Units into the skin 3 (three) times daily with meals. Before each meal 3 times a day, 140-199 - 2 units, 200-250 - 4 units, 251-299 - 6 units,  300-349 - 8 units,  350 or above 10 units. Insulin PEN if approved, provide syringes and  needles if needed. (Patient not taking: No sig reported) 10 mL 11 Not Taking   Lancets (ONETOUCH ULTRASOFT) lancets Use as instructed. Use  patient insurance preferred brand. 300 each 3    Nutritional Supplements (FEEDING SUPPLEMENT, NEPRO CARB STEADY,) LIQD Take 237 mLs by mouth 2 (two) times daily between meals.  0    silver sulfADIAZINE (SILVADENE) 1 % cream Apply 1 application topically daily. (Patient not taking: Reported on 03/17/2021) 400 g 0 Not Taking     Vitals   Vitals:   03/17/21 1026 03/17/21 1329 03/17/21 1551 03/17/21 1657  BP: 136/89 (!) 147/58 (!) 141/62 (!) 138/58  Pulse: 68 67 71 71  Resp: 18 18 18 17   Temp:   99.7 F (37.6 C) (!) 97.5 F (36.4 C)  TempSrc:   Oral   SpO2: 100% 98% 100% 100%  Weight:    66 kg  Height:         Body mass index is 24.21 kg/m.  Physical Exam   Physical Exam Gen: oriented x3 but unable to provide meaningful history HEENT: Atraumatic, normocephalic;mucous membranes moist; oropharynx clear, tongue without atrophy or fasciculations. Neck: Supple, trachea midline. Resp: CTAB, no w/r/r CV: RRR, no m/g/r; nml S1 and S2. 2+ symmetric peripheral pulses. Abd: soft/NT/ND; nabs x 4 quad Extrem: Nml bulk; no cyanosis, clubbing, or edema.  Neuro: *MS: oriented x3 but unable to provide meaningful history. Follows simple commands but not multistep *Speech: mild dysarthria, no aphasia, able to name and repeat *CN:    I: Deferred   II,III: PERRLA, VFF by confrontation, optic discs not visualized 2/2 pupillary constriction.    III,IV,VI: EOMI w/o nystagmus, no ptosis   V: Sensation intact from V1 to V3 to LT   VII: Eyelid closure was full.  Smile symmetric.   VIII: Hearing intact to voice   IX,X: Voice normal, palate elevates symmetrically    XI: SCM/trap 5/5 bilat   XII: Tongue protrudes midline, no atrophy or fasciculations   *Motor:   Normal bulk.  No tremor, rigidity or bradykinesia. Strength 4/5 with mild drift RUE and BLE. Anti-gravity LUE with no movement against gravity distal to elbow.  *Sensory: Intact to light touch, pinprick, temperature vibration throughout.  Symmetric. Propioception intact bilat.  No double-simultaneous extinction.  *Coordination:  FNF intact bilat *Reflexes:  1+ and symmetric throughout without clonus; toes mute bilat *Gait: deferred  NIHSS  1a Level of Conscious.: 0 1b LOC Questions: 0 1c LOC Commands: 0 2 Best Gaze: 0 3 Visual: 0 4 Facial Palsy: 0 5a Motor Arm - left: 2 5b Motor Arm - Right: 1 6a Motor Leg - Left: 1 6b Motor Leg - Right: 1 7 Limb Ataxia: 0 8 Sensory: 0 9 Best Language: 0 10 Dysarthria: 1 11 Extinct. and Inatten.: 0  TOTAL: 6   Premorbid mRS = 3   Labs   CBC:  Recent Labs  Lab 03/16/21 2245 03/17/21 0657  WBC 3.8* 3.2*  HGB 8.7* 7.4*  HCT 25.3* 21.6*  MCV 91.7 90.0  PLT 159 147*    Basic Metabolic Panel:  Lab Results  Component Value Date   NA 140 03/17/2021   K 3.3 (L) 03/17/2021   CO2 33 (H) 03/17/2021   GLUCOSE 108 (H) 03/17/2021   BUN 13 03/17/2021   CREATININE 2.52 (H) 03/17/2021   CALCIUM 8.0 (L) 03/17/2021   GFRNONAA 18 (L) 03/17/2021   GFRAA 27 (L) 08/22/2019   Lipid Panel:  Lab Results  Component Value Date  [place] 49 02/26/2021   HgbA1c:  Lab Results  Component Value Date   HGBA1C 6.1 (H) 02/26/2021   Urine Drug Screen: No results found for: LABOPIA, COCAINSCRNUR, LABBENZ, AMPHETMU, THCU, LABBARB  Alcohol Level No results found for: ETH   Impression   Brianna Dodson is a 87 y.o. female with medical history significant for ESRD on HD MWF with anemia of CKD, paroxysmal A. fib on Eliquis, DM Alzheimer's disease, OSA, HTN, DM, history of recurrent strokes, most recently on 02/25/2021 with MRI showing acute right parietal lobe infarct and right MCA stroke in March 2022 with residual left-sided weakness who presents to the ED following a syncopal episode at home while on the commode. Episode favored to be syncope over TIA. She does not appear to have new deficits on exam. However given her recent stroke, hx a fib, and the fact that she has not had her  vertebrobasilar circulation evaluated on prior admission I think the following additional workup is warranted.  Recommendations   - Brain MRI wo contrast - MRA H&N wo contrast - repeat TTE r/o intracardiac clot (last one was 6 mos ago) - OK to continue eliquis  Will f/u on above results ______________________________________________________________________   Thank you for the opportunity to take part in the care of this patient. If you have any further questions, please contact the neurology consultation attending.  Signed,  Su Monks, MD [facility] [phone]  If 7pm- 7am, please page neurology on call as listed in [place].

## 2021-03-17 NOTE — Progress Notes (Signed)
[facility] Progress Note  Patient: Grettell Ransdell    GHW:[id number]  DOA: 03/16/2021     Date of Service: the patient was seen and examined on 03/17/2021  Chief Complaint  Patient presents with   Loss of Consciousness   Brief hospital course: Arnice Vanepps is a 87 y.o. female with medical history significant for ESRD on HD MWF with anemia of CKD, paroxysmal A. fib on Eliquis, DM Alzheimer's disease, OSA, HTN, DM, history of recurrent strokes, most recently on 02/25/2021 with MRI showing acute right parietal lobe infarct and right MCA stroke in March 2022 with residual left-sided weakness who presents to the ED following a syncopal episode at home while on the commode.  Most of the history is given by the granddaughter at the bedside who states that patient was in her usual state of health prior to going to dialysis earlier but on her return she had to be wheeled into the home whereas usually she is able to use a walker.  Later on when she was using the commode, she called out for help and when the granddaughter went to see her she appeared slumped over and though awake was not answering verbally when her granddaughter called her name several times.  She finally came around but the episode happened again and they called EMS.  While being evaluated in the ED with a chest x-ray she had another syncopal event that was short-lived and improved after she was moved from the wheelchair to the stretcher.  History is limited due to dementia but patient denies feeling unwell.  Denies chest pain, shortness of breath, headache or blurred vision or weakness.  Denies cough.  Denies abdominal pain, nausea vomiting or change in bowel habits. Patient was hospitalized from 8/26-9/1 with problems with AV graft in her left arm complicated by weakness of the left hand.  MRI showed a stroke though this was thought unlikely by neurology.   ED course: On arrival BP 95/53 with otherwise normal vitals Blood work significant  for hemoglobin of 8.7 which appears to be her most recent baseline in the past 2 weeks.  Potassium of 3.  Troponin 15   Chest x-ray: Low lung volumes with mild right infrahilar and left basilar linear scarring and/or atelectasis   EKG, personally viewed and interpreted: NSR at 67 with nonspecific ST-T wave changes   The emergency room provider spoke with nephrologist, Dr. Juleen China who recommended gentle hydration for correction of possible over dialysis.  Hospitalist consulted for admission.     Assessment and Plan:  Syncope , ?TIA   Hypotension - Patient presents with syncope x2 following dialysis session  - Possibly related to fluid removal during dialysis, possible TIA - See below for management of potential etiologies   Hypotension with history of essential hypertension -BP 95/53 in the ED - Hold amlodipine and Coreg - s/p gentle IV hydration - Repeat IV fluid bolus as needed to maintain MAP over 65 Blood pressure is stable now  Possible TIA in the setting of history of recurrent strokes with current residual left hemiparesis - History of R MCA stroke 08/2020, ,  right parietal lobe infarct 01/2021 - Neurologic checks and fall precautions -  CT head negative for any acute findings, old right MCA infarct -TTE was done March 2022 6 months ago, so we will repeat TTE to rule out intracardiac source of any emboli carotid Doppler 01/2021 with no significant stenosis.  Will not repeat - Continuous cardiac monitoring - Neurology consulted, recommended MRI  brain without contrast, MRA head and neck, TTE due to complete stroke work-up - Continue atorvastatin and Eliquis     Atrial fibrillation (HCC)   Chronic anticoagulation - Continue Eliquis - Holding carvedilol due to hypotension     Type II diabetes mellitus with renal manifestations (HCC) - Sliding scale insulin coverage     Anemia in ESRD (end-stage renal disease) (HCC) - Hemoglobin stable at baseline of 8.7     Mixed  Alzheimer's and vascular dementia ([place]) - Continue mirtazapine     End stage renal disease on dialysis [facility]) --nephrology consult for continuation of dialysis    Body mass index is 23.46 kg/m.  Interventions:       Diet: Renal  DVT Prophylaxis: Therapeutic Anticoagulation with Eliquis    Advance goals of care discussion: Full code  Family Communication: family was present at bedside, at the time of interview.  The pt provided permission to discuss medical plan with the family. Opportunity was given to ask question and all questions were answered satisfactorily.   Disposition:  Pt is from ome, admitted with syncope, TIA/stroke, neurology consulted and work-up pending, which precludes a safe discharge. Discharge to home, when cleared by neurology most likely tomorrow am.  Subjective: No significant overnight events, patient admitted with syncopal episode.  Currently patient is asymptomatic, denies any headache or dizziness.  Patient has left wrist drop and left-sided weakness due to prior stroke.  No new neurological findings today.  Patient denied any chest palpitation, no any shortness of breath, no abdominal pain.  Physical Exam: General:  alert oriented to time, place, and person.  Appear in mild distress, affect appropriate Eyes: PERRLA ENT: Oral Mucosa Clear, moist  Neck: no JVD,  Cardiovascular: S1 and S2 Present, no Murmur,  Respiratory: good respiratory effort, Bilateral Air entry equal and Decreased, no Crackles, no wheezes Abdomen: Bowel Sound present, Soft and no tenderness,  Skin: No rashes Extremities: no Pedal edema, mno calf tenderness Neurologic: CN grossly normal and right side normal, left-sided residual weakness, power 4/5, left wrist drop after AV fistula graft placement  Gait not checked due to patient safety concerns  Vitals:   03/17/21 1026 03/17/21 1329 03/17/21 1551 03/17/21 1657  BP: 136/89 (!) 147/58 (!) 141/62 (!) 138/58  Pulse: 68 67 71 71   Resp: 18 18 18 17   Temp:   99.7 F (37.6 C) (!) 97.5 F (36.4 C)  TempSrc:   Oral   SpO2: 100% 98% 100% 100%  Weight:      Height:       No intake or output data in the 24 hours ending 03/17/21 1709 Filed Weights   03/16/21 2141  Weight: 64 kg    Data Reviewed: I have personally reviewed and interpreted daily labs, tele strips, imagings as discussed above. I reviewed all nursing notes, pharmacy notes, vitals, pertinent old records I have discussed plan of care as described above with RN and patient/family.  CBC: Recent Labs  Lab 03/16/21 2245 03/17/21 0657  WBC 3.8* 3.2*  HGB 8.7* 7.4*  HCT 25.3* 21.6*  MCV 91.7 90.0  PLT 159 601*   Basic Metabolic Panel: Recent Labs  Lab 03/16/21 2146 03/17/21 0657  NA 136 140  K 3.0* 3.3*  CL 93* 97*  CO2 32 33*  GLUCOSE 148* 108*  BUN 9 13  CREATININE 2.02* 2.52*  CALCIUM 7.9* 8.0*    Studies: CT HEAD WO CONTRAST (5MM)  Result Date: 03/17/2021 CLINICAL DATA:  Transit ischemic attack.  Syncopal  episode. EXAM: CT HEAD WITHOUT CONTRAST TECHNIQUE: Contiguous axial images were obtained from the base of the skull through the vertex without intravenous contrast. COMPARISON:  02/25/2021 FINDINGS: Brain: There is no evidence for acute hemorrhage, hydrocephalus, mass lesion, or abnormal extra-axial fluid collection. No definite CT evidence for acute infarction. Diffuse loss of parenchymal volume is consistent with atrophy. Patchy low attenuation in the deep hemispheric and periventricular white matter is nonspecific, but likely reflects chronic microvascular ischemic demyelination. Stable appearance old right MCA territory infarct. Vascular: No hyperdense vessel or unexpected calcification. Skull: No evidence for fracture. No worrisome lytic or sclerotic lesion. Sinuses/Orbits: The visualized paranasal sinuses and mastoid air cells are clear. Visualized portions of the globes and intraorbital fat are unremarkable. Other: None. IMPRESSION:  1. No acute intracranial abnormality. 2. Atrophy with chronic small vessel white matter ischemic disease. 3. Old right MCA territory infarct. Electronically Signed   By: Misty Stanley M.D.   On: 03/17/2021 05:12   DG Chest Port 1 View  Result Date: 03/16/2021 CLINICAL DATA:  Syncopal episode. EXAM: PORTABLE CHEST 1 VIEW COMPARISON:  Nov 09, 2013 FINDINGS: A right-sided venous catheter is seen with its distal tip noted within the right atrium. Its position is difficult to determine due to a markedly elevated right hemidiaphragm. Low lung volumes are seen. Mild right infrahilar linear scarring and/or atelectasis is noted with a trace amount of linear scarring and/or atelectasis also seen along the lateral aspect of the left lung base. There is no evidence of a pleural effusion or pneumothorax. The heart size and mediastinal contours are within normal limits. A radiopaque loop recorder device is seen overlying the lateral aspect of the left lung base. Degenerative changes seen throughout the thoracic spine. A deformity of indeterminate age is seen involving the inferior aspect of the left glenoid. IMPRESSION: 1. Low lung volumes with mild right infrahilar and left basilar linear scarring and/or atelectasis. Electronically Signed   By: Virgina Norfolk M.D.   On: 03/16/2021 22:46    Scheduled Meds:  apixaban  2.5 mg Oral BID   insulin aspart  0-5 Units Subcutaneous QHS   insulin aspart  0-6 Units Subcutaneous TID WC   sodium chloride flush  3 mL Intravenous Q12H   Continuous Infusions: PRN Meds: acetaminophen **OR** acetaminophen, ondansetron **OR** ondansetron (ZOFRAN) IV  Time spent: 35 minutes  Author: Val Riles. MD [facility] 03/17/2021 5:09 PM  To reach On-call, see care teams to locate the attending and reach out to them via [email or web address]. If 7PM-7AM, please contact night-coverage If you still have difficulty reaching the attending provider, please page the [facility] (Director on Call)  for [facility] on amion for assistance.

## 2021-03-17 NOTE — ED Notes (Signed)
Pt set up for breakfast.

## 2021-03-17 NOTE — Progress Notes (Signed)
[facility]  ROUNDING NOTE   Subjective:   [name] was admitted to [facility] on 03/16/2021 for Recurrent syncope [R55]  Last hemodialysis treatment was yesterday. Patient completed her entire dialysis treatment. She felt herself to be weak during treatment. After treatment, patient had a syncopal episode. She then had a syncopal episode at home while on the toilet. Patient was then brought to the ED where she was in the waiting room where she had another syncopal episode.   Patient is awake and alert this morning and in good spirits. She has no complaints.    Objective:  Vital signs in last 24 hours:  Temp:  [98.9 F (37.2 C)] 98.9 F (37.2 C) (09/14 2142) Pulse Rate:  [63-70] 68 (09/15 1026) Resp:  [15-18] 18 (09/15 1026) BP: (95-154)/(53-89) 136/89 (09/15 1026) SpO2:  [99 %-100 %] 100 % (09/15 1026) Weight:  [64 kg] 64 kg (09/14 2141)  Weight change:  Filed Weights   03/16/21 2141  Weight: 64 kg    Intake/Output: No intake/output data recorded.   Intake/Output this shift:  No intake/output data recorded.  Physical Exam: General: NAD,   Head: Normocephalic, atraumatic. Moist oral mucosal membranes  Eyes: Anicteric, PERRL  Neck: Supple, trachea midline  Lungs:  Clear to auscultation  Heart: Regular rate and rhythm  Abdomen:  Soft, nontender,   Extremities:  no peripheral edema.  Neurologic: Nonfocal, moving all four extremities  Skin: No lesions  Access: RIJ permcath, left AVF maturing    Basic Metabolic Panel: Recent Labs  Lab 03/16/21 2146 03/17/21 0657  NA 136 140  K 3.0* 3.3*  CL 93* 97*  CO2 32 33*  GLUCOSE 148* 108*  BUN 9 13  CREATININE 2.02* 2.52*  CALCIUM 7.9* 8.0*    Liver Function Tests: No results for input(s): AST, ALT, ALKPHOS, BILITOT, PROT, ALBUMIN in the last 168 hours. No results for input(s): LIPASE, AMYLASE in the last 168 hours. No results for input(s): AMMONIA in the last 168 hours.  CBC: Recent Labs  Lab  03/16/21 2245 03/17/21 0657  WBC 3.8* 3.2*  HGB 8.7* 7.4*  HCT 25.3* 21.6*  MCV 91.7 90.0  PLT 159 147*    Cardiac Enzymes: No results for input(s): CKTOTAL, CKMB, CKMBINDEX, TROPONINI in the last 168 hours.  BNP: Invalid input(s): POCBNP  CBG: Recent Labs  Lab 03/17/21 0147 03/17/21 0655 03/17/21 0726  GLUCAP 148* 68* 116*    Microbiology: Results for orders placed or performed during the hospital encounter of 03/16/21  Resp Panel by RT-PCR (Flu A&B, Covid) Nasopharyngeal Swab     Status: None   Collection Time: 03/17/21  2:04 AM   Specimen: Nasopharyngeal Swab; Nasopharyngeal(NP) swabs in vial transport medium  Result Value Ref Range Status   SARS Coronavirus 2 by RT PCR NEGATIVE NEGATIVE Final    Comment: (NOTE) SARS-CoV-2 target nucleic acids are NOT DETECTED.  The SARS-CoV-2 RNA is generally detectable in upper respiratory specimens during the acute phase of infection. The lowest concentration of SARS-CoV-2 viral copies this assay can detect is 138 copies/mL. A negative result does not preclude SARS-Cov-2 infection and should not be used as the sole basis for treatment or other patient management decisions. A negative result may occur with  improper specimen collection/handling, submission of specimen other than nasopharyngeal swab, presence of viral mutation(s) within the areas targeted by this assay, and inadequate number of viral copies(<138 copies/mL). A negative result must be combined with clinical observations, patient history, and epidemiological information. The  expected result is Negative.  Fact Sheet for Patients:  EntrepreneurPulse.com.au  Fact Sheet for Healthcare Providers:  IncredibleEmployment.be  This test is no t yet approved or cleared by the [place] FDA and  has been authorized for detection and/or diagnosis of SARS-CoV-2 by FDA under an Emergency Use Authorization (EUA). This EUA will remain   in effect (meaning this test can be used) for the duration of the COVID-19 declaration under Section 564(b)(1) of the Act, 21 U.S.C.section 360bbb-3(b)(1), unless the authorization is terminated  or revoked sooner.       Influenza A by PCR NEGATIVE NEGATIVE Final   Influenza B by PCR NEGATIVE NEGATIVE Final    Comment: (NOTE) The Xpert Xpress SARS-CoV-2/FLU/RSV plus assay is intended as an aid in the diagnosis of influenza from Nasopharyngeal swab specimens and should not be used as a sole basis for treatment. Nasal washings and aspirates are unacceptable for Xpert Xpress SARS-CoV-2/FLU/RSV testing.  Fact Sheet for Patients: EntrepreneurPulse.com.au  Fact Sheet for Healthcare Providers: IncredibleEmployment.be  This test is not yet approved or cleared by the [place] FDA and has been authorized for detection and/or diagnosis of SARS-CoV-2 by FDA under an Emergency Use Authorization (EUA). This EUA will remain in effect (meaning this test can be used) for the duration of the COVID-19 declaration under Section 564(b)(1) of the Act, 21 U.S.C. section 360bbb-3(b)(1), unless the authorization is terminated or revoked.  Performed at [facility], [address] [place], [place] [address]     Coagulation Studies: No results for input(s): LABPROT, INR in the last 72 hours.  Urinalysis: No results for input(s): COLORURINE, LABSPEC, PHURINE, GLUCOSEU, HGBUR, BILIRUBINUR, KETONESUR, PROTEINUR, UROBILINOGEN, NITRITE, LEUKOCYTESUR in the last 72 hours.  Invalid input(s): APPERANCEUR    Imaging: CT HEAD WO CONTRAST (5MM)  Result Date: 03/17/2021 CLINICAL DATA:  Transit ischemic attack.  Syncopal episode. EXAM: CT HEAD WITHOUT CONTRAST TECHNIQUE: Contiguous axial images were obtained from the base of the skull through the vertex without intravenous contrast. COMPARISON:  02/25/2021 FINDINGS: Brain: There is no evidence for acute  hemorrhage, hydrocephalus, mass lesion, or abnormal extra-axial fluid collection. No definite CT evidence for acute infarction. Diffuse loss of parenchymal volume is consistent with atrophy. Patchy low attenuation in the deep hemispheric and periventricular white matter is nonspecific, but likely reflects chronic microvascular ischemic demyelination. Stable appearance old right MCA territory infarct. Vascular: No hyperdense vessel or unexpected calcification. Skull: No evidence for fracture. No worrisome lytic or sclerotic lesion. Sinuses/Orbits: The visualized paranasal sinuses and mastoid air cells are clear. Visualized portions of the globes and intraorbital fat are unremarkable. Other: None. IMPRESSION: 1. No acute intracranial abnormality. 2. Atrophy with chronic small vessel white matter ischemic disease. 3. Old right MCA territory infarct. Electronically Signed   By: Misty Stanley M.D.   On: 03/17/2021 05:12   DG Chest Port 1 View  Result Date: 03/16/2021 CLINICAL DATA:  Syncopal episode. EXAM: PORTABLE CHEST 1 VIEW COMPARISON:  Nov 09, 2013 FINDINGS: A right-sided venous catheter is seen with its distal tip noted within the right atrium. Its position is difficult to determine due to a markedly elevated right hemidiaphragm. Low lung volumes are seen. Mild right infrahilar linear scarring and/or atelectasis is noted with a trace amount of linear scarring and/or atelectasis also seen along the lateral aspect of the left lung base. There is no evidence of a pleural effusion or pneumothorax. The heart size and mediastinal contours are within normal limits. A radiopaque loop recorder device is seen  overlying the lateral aspect of the left lung base. Degenerative changes seen throughout the thoracic spine. A deformity of indeterminate age is seen involving the inferior aspect of the left glenoid. IMPRESSION: 1. Low lung volumes with mild right infrahilar and left basilar linear scarring and/or atelectasis.  Electronically Signed   By: Virgina Norfolk M.D.   On: 03/16/2021 22:46     Medications:     apixaban  2.5 mg Oral BID   insulin aspart  0-5 Units Subcutaneous QHS   insulin aspart  0-6 Units Subcutaneous TID WC   sodium chloride flush  3 mL Intravenous Q12H   acetaminophen **OR** acetaminophen, ondansetron **OR** ondansetron (ZOFRAN) IV  Assessment/ Plan:  [name] is a 87 y.o. black female with end stage renal disease on hemodialysis, hypertension, CVA, atrial fibrillation, hyperlipidemia, sleep apnea, diabetes mellitus type II who is admitted to [facility] on 03/16/2021 for Recurrent syncope [R55]  [facility] MWF [facility] [address] RIJ permcath 66kg  End Stage Renal Disease: hemodialysis treatment yesterday. Completed entire treatment. However syncope after treatment. Concern for overdialysis. Will decrease time to 3:45 (225 minutes). AVF maturing.   Syncope: undergoing work up. 2 episodes. Appreciate neurology input.   Hypertension: 136/89. Home regimen includes losartan, amlodipine, carvedilol, hydralazine. Holding for now due to syncopal episode.   Anemia of chronic kidney disease: hemoglobin 7.4. Normocytic. Mircera as outpatient. Hold ESA inpatient due to syncope.   Secondary Hyperparathyroidism: calcium and phosphorus at goal. PTH on 9/7 was 349 - at goal. Not currently on binders.    LOS: 0 [employer] 9/15/202211:29 AM

## 2021-03-17 NOTE — ED Notes (Signed)
Pt c/o discomfort on backside, pt with 2 in x 1 in decubitus above anus. Pt cleaned and sacral decub pad applied. Pt turned to right side. Pt states relief.

## 2021-03-17 NOTE — ED Notes (Signed)
Pt repositioned and set up for lunch. Family at bedside.

## 2021-03-18 ENCOUNTER — Observation Stay
Admit: 2021-03-18 | Discharge: 2021-03-18 | Disposition: A | Discharge status: Home / Self Care | Payer: Medicare Other | Attending: Neurology | Admitting: Neurology

## 2021-03-18 DIAGNOSIS — I120 Hypertensive chronic kidney disease with stage 5 chronic kidney disease or end stage renal disease: Secondary | ICD-10-CM | POA: Diagnosis not present

## 2021-03-18 DIAGNOSIS — D631 Anemia in chronic kidney disease: Secondary | ICD-10-CM | POA: Diagnosis not present

## 2021-03-18 DIAGNOSIS — N2581 Secondary hyperparathyroidism of renal origin: Secondary | ICD-10-CM | POA: Diagnosis not present

## 2021-03-18 DIAGNOSIS — N186 End stage renal disease: Secondary | ICD-10-CM | POA: Diagnosis not present

## 2021-03-18 DIAGNOSIS — R55 Syncope and collapse: Secondary | ICD-10-CM | POA: Diagnosis not present

## 2021-03-18 DIAGNOSIS — L8990 Pressure ulcer of unspecified site, unspecified stage: Secondary | ICD-10-CM | POA: Insufficient documentation

## 2021-03-18 LAB — ECHOCARDIOGRAM COMPLETE BUBBLE STUDY
AR max vel: 2.82 cm2
AV Area VTI: 2.41 cm2
AV Area mean vel: 2.34 cm2
AV Mean grad: 4 mmHg
AV Peak grad: 5.6 mmHg
Ao pk vel: 1.18 m/s
Area-P 1/2: 3.68 cm2
S' Lateral: 2.8 cm

## 2021-03-18 LAB — FOLATE: Folate: 6.3 ng/mL (ref >5.9)

## 2021-03-18 LAB — BASIC METABOLIC PANEL
Anion gap: 6 (ref 5–15)
BUN: 13 mg/dL (ref 8–23)
CO2: 32 mmol/L (ref 22–32)
Calcium: 8.3 mg/dL — ABNORMAL LOW (ref 8.9–10.3)
Chloride: 100 mmol/L (ref 98–111)
Creatinine, Ser: 2.1 mg/dL — ABNORMAL HIGH (ref 0.44–1.00)
GFR, Estimated: 23 mL/min — ABNORMAL LOW (ref >60)
Glucose, Bld: 125 mg/dL — ABNORMAL HIGH (ref 70–99)
Potassium: 3.5 mmol/L (ref 3.5–5.1)
Sodium: 138 mmol/L (ref 135–145)

## 2021-03-18 LAB — CBC
HCT: 21.9 % — ABNORMAL LOW (ref 36.0–46.0)
Hemoglobin: 7.7 g/dL — ABNORMAL LOW (ref 12.0–15.0)
MCH: 32 pg (ref 26.0–34.0)
MCHC: 35.2 g/dL (ref 30.0–36.0)
MCV: 90.9 fL (ref 80.0–100.0)
Platelets: 138 10*3/uL — ABNORMAL LOW (ref 150–400)
RBC: 2.41 MIL/uL — ABNORMAL LOW (ref 3.87–5.11)
RDW: 14.7 % (ref 11.5–15.5)
WBC: 3 10*3/uL — ABNORMAL LOW (ref 4.0–10.5)
nRBC: 0 % (ref 0.0–0.2)

## 2021-03-18 LAB — GLUCOSE, CAPILLARY
Glucose-Capillary: 137 mg/dL — ABNORMAL HIGH (ref 70–99)
Glucose-Capillary: 117 mg/dL — ABNORMAL HIGH (ref 70–99)
Glucose-Capillary: 172 mg/dL — ABNORMAL HIGH (ref 70–99)

## 2021-03-18 LAB — MAGNESIUM: Magnesium: 1.7 mg/dL (ref 1.7–2.4)

## 2021-03-18 LAB — PHOSPHORUS: Phosphorus: 2.2 mg/dL — ABNORMAL LOW (ref 2.5–4.6)

## 2021-03-18 LAB — HEPATITIS B SURFACE ANTIBODY,QUALITATIVE: Hep B S Ab: REACTIVE — AB

## 2021-03-18 LAB — HEPATITIS B SURFACE ANTIGEN: Hepatitis B Surface Ag: NONREACTIVE

## 2021-03-18 MED ORDER — EPOETIN ALFA 10000 UNIT/ML IJ SOLN
10000.0000 [IU] | INTRAMUSCULAR | Status: DC
Start: 2021-03-18 — End: 2021-03-18
  Administered 2021-03-18: 10000 [IU] via INTRAVENOUS

## 2021-03-18 MED ORDER — ZINC OXIDE 40 % EX OINT
TOPICAL_OINTMENT | Freq: Three times a day (TID) | CUTANEOUS | Status: DC
  Filled 2021-03-18: qty 113

## 2021-03-18 MED ORDER — HEPARIN SODIUM (PORCINE) 1000 UNIT/ML IJ SOLN
INTRAMUSCULAR | Status: AC
  Filled 2021-03-18: qty 1

## 2021-03-18 MED ORDER — HYDRALAZINE HCL 50 MG PO TABS: 50.0000 mg | ORAL_TABLET | Freq: Three times a day (TID) | ORAL | Status: DC

## 2021-03-18 MED ORDER — AMLODIPINE BESYLATE 5 MG PO TABS: 5.0000 mg | ORAL_TABLET | Freq: Every day | ORAL | Status: DC

## 2021-03-18 NOTE — Progress Notes (Signed)
[facility]  ROUNDING NOTE   Subjective:   Seen and examined on hemodialysis treatment. Complains of cramping. Ultrafiltration off and changed from 3K bath to 4K bath.   Denies any more syncopal episodes.   Echo pending.   MRI negative.     HEMODIALYSIS FLOWSHEET:  Blood Flow Rate (mL/min): 400 mL/min Arterial Pressure (mmHg): -150 mmHg Venous Pressure (mmHg): 120 mmHg Transmembrane Pressure (mmHg): 70 mmHg Ultrafiltration Rate (mL/min): 170 mL/min Dialysate Flow Rate (mL/min): 500 ml/min Conductivity: Machine : 13.8 Conductivity: Machine : 13.8    Objective:  Vital signs in last 24 hours:  Temp:  [97.5 F (36.4 C)-99.7 F (37.6 C)] 98.3 F (36.8 C) (09/16 0330) Pulse Rate:  [62-72] 64 (09/16 0945) Resp:  [14-35] 15 (09/16 0945) BP: (138-175)/(58-69) 168/67 (09/16 0945) SpO2:  [98 %-100 %] 100 % (09/16 0330) Weight:  [65.6 kg-66 kg] 65.6 kg (09/16 0329)  Weight change: 2.041 kg Filed Weights   03/16/21 2141 03/17/21 1657 03/18/21 0329  Weight: 64 kg 66 kg 65.6 kg    Intake/Output: I/O last 3 completed shifts: In: 240 [P.O.:240] Out: 130 [Urine:130]   Intake/Output this shift:  No intake/output data recorded.  Physical Exam: General: NAD, laying in bed  Head: Normocephalic, atraumatic. Moist oral mucosal membranes  Eyes: Anicteric, PERRL  Neck: Supple, trachea midline  Lungs:  Clear to auscultation  Heart: Regular rate and rhythm  Abdomen:  Soft, nontender,   Extremities:  no peripheral edema.  Neurologic: Nonfocal, moving all four extremities  Skin: No lesions  Access: RIJ permcath, left AVF maturing    Basic Metabolic Panel: Recent Labs  Lab 03/16/21 2146 03/17/21 0657 03/18/21 0911  NA 136 140 138  K 3.0* 3.3* 3.5  CL 93* 97* 100  CO2 32 33* 32  GLUCOSE 148* 108* 125*  BUN 9 13 13   CREATININE 2.02* 2.52* 2.10*  CALCIUM 7.9* 8.0* 8.3*  MG  --   --  1.7  PHOS  --   --  2.2*     Liver Function Tests: No results for  input(s): AST, ALT, ALKPHOS, BILITOT, PROT, ALBUMIN in the last 168 hours. No results for input(s): LIPASE, AMYLASE in the last 168 hours. No results for input(s): AMMONIA in the last 168 hours.  CBC: Recent Labs  Lab 03/16/21 2245 03/17/21 0657 03/18/21 0911  WBC 3.8* 3.2* 3.0*  HGB 8.7* 7.4* 7.7*  HCT 25.3* 21.6* 21.9*  MCV 91.7 90.0 90.9  PLT 159 147* 138*     Cardiac Enzymes: No results for input(s): CKTOTAL, CKMB, CKMBINDEX, TROPONINI in the last 168 hours.  BNP: Invalid input(s): POCBNP  CBG: Recent Labs  Lab 03/17/21 0147 03/17/21 0655 03/17/21 0726 03/17/21 1203 03/17/21 2030  GLUCAP 148* 62* 116* 150* 113*     Microbiology: Results for orders placed or performed during the hospital encounter of 03/16/21  Resp Panel by RT-PCR (Flu A&B, Covid) Nasopharyngeal Swab     Status: None   Collection Time: 03/17/21  2:04 AM   Specimen: Nasopharyngeal Swab; Nasopharyngeal(NP) swabs in vial transport medium  Result Value Ref Range Status   SARS Coronavirus 2 by RT PCR NEGATIVE NEGATIVE Final    Comment: (NOTE) SARS-CoV-2 target nucleic acids are NOT DETECTED.  The SARS-CoV-2 RNA is generally detectable in upper respiratory specimens during the acute phase of infection. The lowest concentration of SARS-CoV-2 viral copies this assay can detect is 138 copies/mL. A negative result does not preclude SARS-Cov-2 infection and should not be used as the sole  basis for treatment or other patient management decisions. A negative result may occur with  improper specimen collection/handling, submission of specimen other than nasopharyngeal swab, presence of viral mutation(s) within the areas targeted by this assay, and inadequate number of viral copies(<138 copies/mL). A negative result must be combined with clinical observations, patient history, and epidemiological information. The expected result is Negative.  Fact Sheet for Patients:   EntrepreneurPulse.com.au  Fact Sheet for Healthcare Providers:  IncredibleEmployment.be  This test is no t yet approved or cleared by the [place] FDA and  has been authorized for detection and/or diagnosis of SARS-CoV-2 by FDA under an Emergency Use Authorization (EUA). This EUA will remain  in effect (meaning this test can be used) for the duration of the COVID-19 declaration under Section 564(b)(1) of the Act, 21 U.S.C.section 360bbb-3(b)(1), unless the authorization is terminated  or revoked sooner.       Influenza A by PCR NEGATIVE NEGATIVE Final   Influenza B by PCR NEGATIVE NEGATIVE Final    Comment: (NOTE) The Xpert Xpress SARS-CoV-2/FLU/RSV plus assay is intended as an aid in the diagnosis of influenza from Nasopharyngeal swab specimens and should not be used as a sole basis for treatment. Nasal washings and aspirates are unacceptable for Xpert Xpress SARS-CoV-2/FLU/RSV testing.  Fact Sheet for Patients: EntrepreneurPulse.com.au  Fact Sheet for Healthcare Providers: IncredibleEmployment.be  This test is not yet approved or cleared by the [place] FDA and has been authorized for detection and/or diagnosis of SARS-CoV-2 by FDA under an Emergency Use Authorization (EUA). This EUA will remain in effect (meaning this test can be used) for the duration of the COVID-19 declaration under Section 564(b)(1) of the Act, 21 U.S.C. section 360bbb-3(b)(1), unless the authorization is terminated or revoked.  Performed at [facility], [address] [place], [place] [address]     Coagulation Studies: No results for input(s): LABPROT, INR in the last 72 hours.  Urinalysis: No results for input(s): COLORURINE, LABSPEC, PHURINE, GLUCOSEU, HGBUR, BILIRUBINUR, KETONESUR, PROTEINUR, UROBILINOGEN, NITRITE, LEUKOCYTESUR in the last 72 hours.  Invalid input(s): APPERANCEUR    Imaging: CT  HEAD WO CONTRAST (5MM)  Result Date: 03/17/2021 CLINICAL DATA:  Transit ischemic attack.  Syncopal episode. EXAM: CT HEAD WITHOUT CONTRAST TECHNIQUE: Contiguous axial images were obtained from the base of the skull through the vertex without intravenous contrast. COMPARISON:  02/25/2021 FINDINGS: Brain: There is no evidence for acute hemorrhage, hydrocephalus, mass lesion, or abnormal extra-axial fluid collection. No definite CT evidence for acute infarction. Diffuse loss of parenchymal volume is consistent with atrophy. Patchy low attenuation in the deep hemispheric and periventricular white matter is nonspecific, but likely reflects chronic microvascular ischemic demyelination. Stable appearance old right MCA territory infarct. Vascular: No hyperdense vessel or unexpected calcification. Skull: No evidence for fracture. No worrisome lytic or sclerotic lesion. Sinuses/Orbits: The visualized paranasal sinuses and mastoid air cells are clear. Visualized portions of the globes and intraorbital fat are unremarkable. Other: None. IMPRESSION: 1. No acute intracranial abnormality. 2. Atrophy with chronic small vessel white matter ischemic disease. 3. Old right MCA territory infarct. Electronically Signed   By: Misty Stanley M.D.   On: 03/17/2021 05:12   MR ANGIO HEAD WO CONTRAST  Result Date: 03/17/2021 CLINICAL DATA:  Neuro deficit, acute, stroke suspected EXAM: MRI HEAD WITHOUT CONTRAST MRA HEAD WITHOUT CONTRAST MRA NECK WITHOUT CONTRAST TECHNIQUE: Multiplanar, multiecho pulse sequences of the brain and surrounding structures were obtained without intravenous contrast. Angiographic images of the Circle of Willis were obtained using  MRA technique without intravenous contrast. Angiographic images of the neck were obtained using MRA technique without intravenous contrast. Carotid stenosis measurements (when applicable) are obtained utilizing NASCET criteria, using the distal internal carotid diameter as the  denominator. COMPARISON:  None. FINDINGS: MRI HEAD FINDINGS Brain: No acute infarct, mass effect or extra-axial collection. No acute or chronic hemorrhage. Hyperintense T2-weighted signal is moderately widespread throughout the white matter. Generalized volume loss without a clear lobar predilection. There is a large, old right MCA territory infarct. Multiple old small vessel infarcts of the cerebellum. Vascular: Major flow voids are preserved. Skull and upper cervical spine: Normal calvarium and skull base. Visualized upper cervical spine and soft tissues are normal. Sinuses/Orbits:No paranasal sinus fluid levels or advanced mucosal thickening. No mastoid or middle ear effusion. Normal orbits. MRA HEAD FINDINGS POSTERIOR CIRCULATION: --Vertebral arteries: Narrowed distal right V4 segment. Normal left. Left side is dominant. --Inferior cerebellar arteries: Normal. --Basilar artery: Normal. --Superior cerebellar arteries: Normal. --Posterior cerebral arteries: Normal. ANTERIOR CIRCULATION: --Intracranial internal carotid arteries: Normal. --Anterior cerebral arteries (ACA): Normal. --Middle cerebral arteries (MCA): Normal. ANATOMIC VARIANTS: Fetal origins of both PCAs. MRA NECK FINDINGS Time-of-flight imaging of the neck is degraded by motion. The vertebral system is left dominant. Both V2 segments are patent aside from 1 area in the mid right V2 segment that is likely an artifact of motion or flow direction. There is no stenosis or acute abnormality of either carotid system. IMPRESSION: 1. No acute intracranial abnormality. 2. 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 3. No emergent large vessel occlusion or high-grade stenosis. 4. Motion degraded MRA of the neck without hemodynamically significant stenosis. Electronically Signed   By: Ulyses Jarred M.D.   On: 03/17/2021 22:27   MR ANGIO NECK WO CONTRAST  Result Date: 03/17/2021 CLINICAL DATA:  Neuro  deficit, acute, stroke suspected EXAM: MRI HEAD WITHOUT CONTRAST MRA HEAD WITHOUT CONTRAST MRA NECK WITHOUT CONTRAST TECHNIQUE: Multiplanar, multiecho pulse sequences of the brain and surrounding structures were obtained without intravenous contrast. Angiographic images of the Circle of Willis were obtained using MRA technique without intravenous contrast. Angiographic images of the neck were obtained using MRA technique without intravenous contrast. Carotid stenosis measurements (when applicable) are obtained utilizing NASCET criteria, using the distal internal carotid diameter as the denominator. COMPARISON:  None. FINDINGS: MRI HEAD FINDINGS Brain: No acute infarct, mass effect or extra-axial collection. No acute or chronic hemorrhage. Hyperintense T2-weighted signal is moderately widespread throughout the white matter. Generalized volume loss without a clear lobar predilection. There is a large, old right MCA territory infarct. Multiple old small vessel infarcts of the cerebellum. Vascular: Major flow voids are preserved. Skull and upper cervical spine: Normal calvarium and skull base. Visualized upper cervical spine and soft tissues are normal. Sinuses/Orbits:No paranasal sinus fluid levels or advanced mucosal thickening. No mastoid or middle ear effusion. Normal orbits. MRA HEAD FINDINGS POSTERIOR CIRCULATION: --Vertebral arteries: Narrowed distal right V4 segment. Normal left. Left side is dominant. --Inferior cerebellar arteries: Normal. --Basilar artery: Normal. --Superior cerebellar arteries: Normal. --Posterior cerebral arteries: Normal. ANTERIOR CIRCULATION: --Intracranial internal carotid arteries: Normal. --Anterior cerebral arteries (ACA): Normal. --Middle cerebral arteries (MCA): Normal. ANATOMIC VARIANTS: Fetal origins of both PCAs. MRA NECK FINDINGS Time-of-flight imaging of the neck is degraded by motion. The vertebral system is left dominant. Both V2 segments are patent aside from 1 area in the  mid right V2 segment that is likely an artifact of motion or flow direction. There is  no stenosis or acute abnormality of either carotid system. IMPRESSION: 1. No acute intracranial abnormality. 2. 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 3. No emergent large vessel occlusion or high-grade stenosis. 4. Motion degraded MRA of the neck without hemodynamically significant stenosis. Electronically Signed   By: Ulyses Jarred M.D.   On: 03/17/2021 22:27   MR BRAIN WO CONTRAST  Result Date: 03/17/2021 CLINICAL DATA:  Neuro deficit, acute, stroke suspected EXAM: MRI HEAD WITHOUT CONTRAST MRA HEAD WITHOUT CONTRAST MRA NECK WITHOUT CONTRAST TECHNIQUE: Multiplanar, multiecho pulse sequences of the brain and surrounding structures were obtained without intravenous contrast. Angiographic images of the Circle of Willis were obtained using MRA technique without intravenous contrast. Angiographic images of the neck were obtained using MRA technique without intravenous contrast. Carotid stenosis measurements (when applicable) are obtained utilizing NASCET criteria, using the distal internal carotid diameter as the denominator. COMPARISON:  None. FINDINGS: MRI HEAD FINDINGS Brain: No acute infarct, mass effect or extra-axial collection. No acute or chronic hemorrhage. Hyperintense T2-weighted signal is moderately widespread throughout the white matter. Generalized volume loss without a clear lobar predilection. There is a large, old right MCA territory infarct. Multiple old small vessel infarcts of the cerebellum. Vascular: Major flow voids are preserved. Skull and upper cervical spine: Normal calvarium and skull base. Visualized upper cervical spine and soft tissues are normal. Sinuses/Orbits:No paranasal sinus fluid levels or advanced mucosal thickening. No mastoid or middle ear effusion. Normal orbits. MRA HEAD FINDINGS POSTERIOR CIRCULATION: --Vertebral arteries:  Narrowed distal right V4 segment. Normal left. Left side is dominant. --Inferior cerebellar arteries: Normal. --Basilar artery: Normal. --Superior cerebellar arteries: Normal. --Posterior cerebral arteries: Normal. ANTERIOR CIRCULATION: --Intracranial internal carotid arteries: Normal. --Anterior cerebral arteries (ACA): Normal. --Middle cerebral arteries (MCA): Normal. ANATOMIC VARIANTS: Fetal origins of both PCAs. MRA NECK FINDINGS Time-of-flight imaging of the neck is degraded by motion. The vertebral system is left dominant. Both V2 segments are patent aside from 1 area in the mid right V2 segment that is likely an artifact of motion or flow direction. There is no stenosis or acute abnormality of either carotid system. IMPRESSION: 1. No acute intracranial abnormality. 2. 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 3. No emergent large vessel occlusion or high-grade stenosis. 4. Motion degraded MRA of the neck without hemodynamically significant stenosis. Electronically Signed   By: Ulyses Jarred M.D.   On: 03/17/2021 22:27   DG Chest Port 1 View  Result Date: 03/16/2021 CLINICAL DATA:  Syncopal episode. EXAM: PORTABLE CHEST 1 VIEW COMPARISON:  Nov 09, 2013 FINDINGS: A right-sided venous catheter is seen with its distal tip noted within the right atrium. Its position is difficult to determine due to a markedly elevated right hemidiaphragm. Low lung volumes are seen. Mild right infrahilar linear scarring and/or atelectasis is noted with a trace amount of linear scarring and/or atelectasis also seen along the lateral aspect of the left lung base. There is no evidence of a pleural effusion or pneumothorax. The heart size and mediastinal contours are within normal limits. A radiopaque loop recorder device is seen overlying the lateral aspect of the left lung base. Degenerative changes seen throughout the thoracic spine. A deformity of indeterminate age is seen  involving the inferior aspect of the left glenoid. IMPRESSION: 1. Low lung volumes with mild right infrahilar and left basilar linear scarring and/or atelectasis. Electronically Signed   By: Joyce Gross.D.  On: 03/16/2021 22:46     Medications:     apixaban  2.5 mg Oral BID   Chlorhexidine Gluconate Cloth  6 each Topical Q0600   insulin aspart  0-5 Units Subcutaneous QHS   insulin aspart  0-6 Units Subcutaneous TID WC   sodium chloride flush  3 mL Intravenous Q12H   acetaminophen **OR** acetaminophen, ondansetron **OR** ondansetron (ZOFRAN) IV  Assessment/ Plan:  Ms. Manali Mcelmurry is a 87 y.o. black female with end stage renal disease on hemodialysis, hypertension, CVA, atrial fibrillation, hyperlipidemia, sleep apnea, diabetes mellitus type II who is admitted to [facility] on 03/16/2021 for Syncope [R55] Syncope, unspecified syncope type [R55] Recurrent syncope [R55]  [facility] MWF [facility] [address] RIJ permcath 66kg  End Stage Renal Disease: seen and examined on hemodialysis treatment.  - Decreased outpatient treatment time to 216minutes.  - Monitor weight, patient may have gained weight.  - May need 24 hour urine for creatinine clearance on discharge  Syncope: undergoing work up. Appreciate neurology input. MRI negative.   Hypertension: 168/67. Home regimen includes losartan, amlodipine, carvedilol, hydralazine. Holding for now due to syncopal episode. She may be discharged off all her blood pressure agents and we will monitor blood pressures.   Anemia of chronic kidney disease: hemoglobin 7.7. Normocytic. Mircera as outpatient. Outpatient iron levels at goal on 9/7.  - EPO with HD treatments.   Secondary Hyperparathyroidism: calcium and phosphorus at goal. PTH on 9/7 was 349 - at goal. Not currently on binders.    LOS: 0 Kari Kerth 9/16/202211:13 AM

## 2021-03-18 NOTE — Plan of Care (Signed)
  Problem: Clinical Measurements: Goal: Ability to maintain clinical measurements within normal limits will improve Outcome: Progressing   Problem: Clinical Measurements: Goal: Will remain free from infection Outcome: Progressing   Problem: Clinical Measurements: Goal: Diagnostic test results will improve Outcome: Progressing   

## 2021-03-18 NOTE — Consult Note (Signed)
[place] Nurse Consult Note: Patient receiving care in [facility] 253. Reason for Consult: "pressure ulcer" Wound type: MASD-ITD-IAD to upper gluteal cleft from moisture and incontinence Pressure Injury POA: Yes/No/NA Measurement: 1 cm x 4 cm x 0.2 cm Wound bed: 100% pink Drainage (amount, consistency, odor) none Periwound: macerated wound edges Dressing procedure/placement/frequency: Triple paste TID: Apply to "fissure" in gluteal fold just below the coccyx.  Monitor the wound area(s) for worsening of condition such as: Signs/symptoms of infection,  Increase in size,  Development of or worsening of odor, Development of pain, or increased pain at the affected locations.  Notify the medical team if any of these develop.  Thank you for the consult.[place] nurse will not follow at this time.  Please re-consult the [facility] team if needed.  Val Riles, RN, MSN, CWOCN, CNS-BC, pager [phone]

## 2021-03-18 NOTE — Plan of Care (Signed)

## 2021-03-18 NOTE — Care Management Obs Status (Signed)
[facility] NOTIFICATION   Patient Details  Name: Brianna Dodson MRN: [id number] Date of Birth: 1934/04/23   Medicare Observation Status Notification Given:  Yes Will place on chart for her to take home with her at discharge.    Candie Chroman, LCSW 03/18/2021, 10:48 AM

## 2021-03-18 NOTE — Discharge Summary (Signed)
In [facility] Discharge Summary   Patient: Brianna Dodson IRS:[id number]  PCP: [name], MD  Date of admission: 03/16/2021   Date of discharge:  03/18/2021     Discharge Diagnoses:  Principal Problem:   Syncope and collapse Active Problems:   Essential hypertension   Type II diabetes mellitus with renal manifestations ([place])   History of stroke with current residual effects   Anemia in ESRD (end-stage renal disease) (HCC)   Atrial fibrillation (HCC)   Mixed Alzheimer's and vascular dementia ([place])   End stage renal disease on dialysis ([place])   Chronic anticoagulation   Hypotension   Recurrent syncope   Pressure injury of skin   Admitted From: Home Disposition:  Home   Recommendations for Outpatient Follow-up:  PCP: In 1 week, continue to monitor BP at home with PCP to titrate medication accordingly Follow with neurology in 1 to 2 weeks Follow with nephrology and continue hemodialysis Follow up LABS/TEST: CBC in 1 to 2 weeks   Diet recommendation: Renal diet  Activity: The patient is advised to gradually reintroduce usual activities, as tolerated  Discharge Condition: stable  Code Status: Full code   History of present illness: As per the H and P dictated on admission Hospital Course:  Brianna Dodson is a 87 y.o. female with medical history significant for ESRD on HD MWF with anemia of CKD, paroxysmal A. fib on Eliquis, DM Alzheimer's disease, OSA, HTN, DM, history of recurrent strokes, most recently on 02/25/2021 with MRI showing acute right parietal lobe infarct and right MCA stroke in March 2022 with residual left-sided weakness who presents to the ED following a syncopal episode at home while on the commode.  Most of the history is given by the granddaughter at the bedside who states that patient was in her usual state of health prior to going to dialysis earlier but on her return she had to be wheeled into the home whereas usually she is able to use a walker.   Later on when she was using the commode, she called out for help and when the granddaughter went to see her she appeared slumped over and though awake was not answering verbally when her granddaughter called her name several times.  She finally came around but the episode happened again and they called EMS.  While being evaluated in the ED with a chest x-ray she had another syncopal event that was short-lived and improved after she was moved from the wheelchair to the stretcher.  History is limited due to dementia but patient denies feeling unwell.  Denies chest pain, shortness of breath, headache or blurred vision or weakness.  Denies cough.  Denies abdominal pain, nausea vomiting or change in bowel habits. Patient was hospitalized from 8/26-9/1 with problems with AV graft in her left arm complicated by weakness of the left hand.  MRI showed a stroke though this was thought unlikely by neurology.   ED course: On arrival BP 95/53 with otherwise normal vitals Blood work significant for hemoglobin of 8.7 which appears to be her most recent baseline in the past 2 weeks.  Potassium of 3.  Troponin 15   Chest x-ray: Low lung volumes with mild right infrahilar and left basilar linear scarring and/or atelectasis   EKG, personally viewed and interpreted: NSR at 67 with nonspecific ST-T wave changes   The emergency room provider spoke with nephrologist, Dr. Juleen China who recommended gentle hydration for correction of possible over dialysis.  Hospitalist consulted for admission.   Assessment  and Plan: # Syncope due to Hypotension Patient presents with syncope x2 following dialysis session, Possibly related to fluid removal during dialysis, possible TIA.  Patient blood pressure improved after IV fluid bolus was given in the ED.  Patient remained asymptomatic. # Hypotension with history of essential hypertension BP 95/53 in the ED, held amlodipine, hydralazine and Coreg, BP improved s/p gentle IV hydration Blood  pressure is now under control, slightly elevated so we will resume amlodipine and hydralazine now.  Patient will continue her home medications with holding parameters now.  Patient was advised to monitor BP closely and follow with PCP.   Possible TIA in the setting of history of recurrent strokes with current residual left hemiparesis. History of R MCA stroke 08/2020, ,  right parietal lobe infarct 01/2021.  Neuro check was done as per protocol, no new neurological findings. CT head negative for any acute findings, old right MCA infarct. TTE was done March 2022 6 months ago, repeated TTE shows LVEF 2585%, grade 1 diastolic dysfunction, no any other significant findings.  arotid Doppler 01/2021 with no significant stenosis. Neurology consulted, recommended MRI brain without contrast, MRA head and neck, TTE due to complete stroke work-up.  MRI brain negative for any acute stroke, MRA head and neck did not show any large vessel occlusion. Continue atorvastatin and Eliquis # Atrial fibrillation, on Chronic anticoagulation, Continue Eliquis, Resumed carvedilo, monitor BP and check heart rate at home and follow with PCP for further management # Type II diabetes mellitus with renal manifestations, resume home medications, monitor FSBG at home and continue diabetic diet. # Anemia in ESRD (end-stage renal disease) Hemoglobin low 7.7, repeat CBC in 1 to 2 weeks, patient may need blood transfusion.  Patient received epo as per nephrology.  # Mixed Alzheimer's and vascular dementia, Continue mirtazapine # End stage renal disease on dialysi, nephrology consult for continuation of dialysis Body mass index is 24.08 kg/m.  Nutrition Interventions:    Pressure Injury 03/17/21 Coccyx Posterior Stage 2 -  Partial thickness loss of dermis presenting as a shallow open injury with a red, pink wound bed without slough. (Active)  03/17/21 1743  Location: Coccyx  Location Orientation: Posterior  Staging: Stage 2 -  Partial  thickness loss of dermis presenting as a shallow open injury with a red, pink wound bed without slough.  Wound Description (Comments):   Present on Admission: Yes      Patient was seen by physical therapy, who recommended Home health, which was arranged. On the day of the discharge the patient's vitals were stable, and no other acute medical condition were reported by patient. the patient was felt safe to be discharge at Home with Home health.  Consultants: Neurology and nephrology Procedures: Dialysis  Discharge Exam: General: Appear in no distress, no Rash; Oral Mucosa Clear, moist. Cardiovascular: S1 and S2 Present, no Murmur, Respiratory: nonormal respiratory effort, Bilateral Air entry present and no Crackles, no wheezes Abdomen: Bowel Sound present, Soft and no tenderness, no hernia Extremities: no Pedal edema, no calf tenderness, left arm swelling due to AV graft Neurology: alert and oriented to time, place, and person, left-sided residual weakness power 4/5, left wrist drop after AV graft, affect appropriate.  Filed Weights   03/16/21 2141 03/17/21 1657 03/18/21 0329  Weight: 64 kg 66 kg 65.6 kg   Vitals:   03/18/21 1115 03/18/21 1130  BP: (!) 166/67 (!) 156/71  Pulse: 69 68  Resp: 17 16  Temp:    SpO2:  DISCHARGE MEDICATION: Allergies as of 03/18/2021       Reactions   Nsaids    CKD stage III - Avoid all nephrotoxic drugs        Medication List     STOP taking these medications    insulin aspart 100 UNIT/ML injection Commonly known as: novoLOG   silver sulfADIAZINE 1 % cream Commonly known as: SILVADENE       TAKE these medications    Accu-Chek Guide test strip Generic drug: glucose blood USE TO CHECK BLOOD SUGARS TWICE DAILY. E11.9   acetaminophen 325 MG tablet Commonly known as: TYLENOL Take 2 tablets (650 mg total) by mouth every 6 (six) hours as needed for mild pain, fever or moderate pain. What changed:  how much to take when to  take this   amLODipine 5 MG tablet Commonly known as: NORVASC Take 1 tablet (5 mg total) by mouth daily. Hold if SBP less than 130 mmHg What changed:  additional instructions Another medication with the same name was removed. Continue taking this medication, and follow the directions you see here.   ascorbic acid 500 MG tablet Commonly known as: VITAMIN C Take 1 tablet (500 mg total) by mouth daily.   atorvastatin 80 MG tablet Commonly known as: LIPITOR Take 1 tablet (80 mg total) by mouth daily.   carvedilol 6.25 MG tablet Commonly known as: COREG TAKE 1 TABLET (6.25 MG TOTAL) BY MOUTH 2 (TWO) TIMES DAILY WITH A MEAL.   doxycycline 100 MG capsule Commonly known as: VIBRAMYCIN Take 1 capsule (100 mg total) by mouth 2 (two) times daily.   Eliquis 2.5 MG Tabs tablet Generic drug: apixaban Take 1 tablet (2.5 mg total) by mouth 2 (two) times daily.   ezetimibe 10 MG tablet Commonly known as: ZETIA TAKE 1 TABLET BY MOUTH EVERY DAY   feeding supplement (NEPRO CARB STEADY) Liqd Take 237 mLs by mouth 2 (two) times daily between meals.   gabapentin 100 MG capsule Commonly known as: NEURONTIN Take 1 capsule (100 mg total) by mouth 3 (three) times a week. Take after dialysis. What changed: Another medication with the same name was removed. Continue taking this medication, and follow the directions you see here.   hydrALAZINE 50 MG tablet Commonly known as: APRESOLINE Take 1 tablet (50 mg total) by mouth in the morning and at bedtime. Hold if SBP  less than 140 mmHg What changed: additional instructions   Iron 28 MG Tabs Take 1 tablet by mouth daily in the afternoon.   linagliptin 5 MG Tabs tablet Commonly known as: Tradjenta Take 1 tablet (5 mg total) by mouth daily.   liver oil-zinc oxide 40 % ointment Commonly known as: DESITIN Apply topically as needed for irritation.   mirtazapine 7.5 MG tablet Commonly known as: REMERON Take 7.5 mg by mouth at bedtime.    multivitamin Tabs tablet Take 1 tablet by mouth at bedtime.   onetouch ultrasoft lancets Use as instructed. Use patient insurance preferred brand.   senna-docusate 8.6-50 MG tablet Commonly known as: Senokot-S Take 1 tablet by mouth at bedtime as needed for mild constipation.   Vitamin D (Ergocalciferol) 1.25 MG (50000 UNIT) Caps capsule Commonly known as: DRISDOL Take 50,000 Units by mouth every 30 (thirty) days.               Discharge Care Instructions  (From admission, onward)           Start     Ordered   03/18/21 0000  Discharge wound care:       Comments: Continue wound care and frequent turning every 2 hourly   03/18/21 1609           Allergies  Allergen Reactions   Nsaids     CKD stage III - Avoid all nephrotoxic drugs   Discharge Instructions     Call MD for:  difficulty breathing, headache or visual disturbances   Complete by: As directed    Call MD for:  extreme fatigue   Complete by: As directed    Call MD for:  persistant dizziness or light-headedness   Complete by: As directed    Call MD for:  persistant nausea and vomiting   Complete by: As directed    Call MD for:  severe uncontrolled pain   Complete by: As directed    Call MD for:  temperature >100.4   Complete by: As directed    Diet - low sodium heart healthy   Complete by: As directed    Discharge instructions   Complete by: As directed    Follow with PCP in 1 week, continue to monitor blood pressure at home and follow with PCP Follow with neurology in 1 to 2 weeks, Follow with nephrology and continue hemodialysis as per schedule.   Discharge wound care:   Complete by: As directed    Continue wound care and frequent turning every 2 hourly   Increase activity slowly   Complete by: As directed        The results of significant diagnostics from this hospitalization (including imaging, microbiology, ancillary and laboratory) are listed below for reference.    Significant  Diagnostic Studies: CT HEAD WO CONTRAST (5MM)  Result Date: 03/17/2021 CLINICAL DATA:  Transit ischemic attack.  Syncopal episode. EXAM: CT HEAD WITHOUT CONTRAST TECHNIQUE: Contiguous axial images were obtained from the base of the skull through the vertex without intravenous contrast. COMPARISON:  02/25/2021 FINDINGS: Brain: There is no evidence for acute hemorrhage, hydrocephalus, mass lesion, or abnormal extra-axial fluid collection. No definite CT evidence for acute infarction. Diffuse loss of parenchymal volume is consistent with atrophy. Patchy low attenuation in the deep hemispheric and periventricular white matter is nonspecific, but likely reflects chronic microvascular ischemic demyelination. Stable appearance old right MCA territory infarct. Vascular: No hyperdense vessel or unexpected calcification. Skull: No evidence for fracture. No worrisome lytic or sclerotic lesion. Sinuses/Orbits: The visualized paranasal sinuses and mastoid air cells are clear. Visualized portions of the globes and intraorbital fat are unremarkable. Other: None. IMPRESSION: 1. No acute intracranial abnormality. 2. Atrophy with chronic small vessel white matter ischemic disease. 3. Old right MCA territory infarct. Electronically Signed   By: Misty Stanley M.D.   On: 03/17/2021 05:12   CT HEAD WO CONTRAST (5MM)  Result Date: 02/25/2021 CLINICAL DATA:  87 year old female with dizziness. Acute on chronic right MCA infarct in March. Dialysis patient with pain in the region of left arm AV fistula. EXAM: CT HEAD WITHOUT CONTRAST TECHNIQUE: Contiguous axial images were obtained from the base of the skull through the vertex without intravenous contrast. COMPARISON:  Brain MRI 09/12/2020.  Head CT 09/10/2020. FINDINGS: Brain: Right MCA territory encephalomalacia. Mild ex vacuo enlargement of the ventricles. Patchy and confluent additional bilateral cerebral white matter hypodensity. No midline shift, ventriculomegaly, mass effect,  evidence of mass lesion, intracranial hemorrhage or evidence of cortically based acute infarction. Vascular: Extensive Calcified atherosclerosis at the skull base. Right MCA calcified atherosclerosis. No suspicious intracranial vascular hyperdensity. Skull: No acute osseous  abnormality identified. Sinuses/Orbits: Continued bubbly opacity in the right sphenoid sinus. Other Visualized paranasal sinuses and mastoids are clear. Other: No acute orbit or scalp soft tissue finding. IMPRESSION: 1. Chronic right MCA territory infarct and advanced chronic small vessel disease. 2. No acute intracranial abnormality. Electronically Signed   By: Genevie Ann M.D.   On: 02/25/2021 10:16   CT ANGIO UP EXTREM LEFT W &/OR WO CONTAST  Result Date: 02/25/2021 CLINICAL DATA:  87 year old female with a murmur at the site of AV fistula EXAM: CT ANGIOGRAPHY UPPER LEFT EXTREMITY TECHNIQUE: Axial spiral CT images were acquired of the left upper extremity after administration of a standard IV contrast bolus. The images were acquired with timing specific for the arterial system. Axial and coronal images were performed on a separate workstation CONTRAST:  35mL OMNIPAQUE IOHEXOL 350 MG/ML SOLN COMPARISON:  None. FINDINGS: VASCULAR: Axillary artery is patent with mild atherosclerosis and no high-grade stenosis or occlusion. The humeral circumflex branches remain patent. Brachial artery is patent without significant atherosclerosis throughout the upper arm. Surgical changes of arteriovenous fistula/dialysis circuit of the upper arm. The anastomosis is patent, with patent venous outflow through the brachial vein, axillary vein, and distal subclavian vein. Distal to the anastomosis the brachial artery remains patent to the antecubital region. Radial artery and the ulnar artery are patent to the distal forearm. There is decreased attenuation distally, which may be secondary to late arrival of the contrast bolus. The radial artery is dominant compared  to the ulnar artery. A small interosseous artery is present proximally, without opacification distally. Incidentally imaged left common femoral artery appears patent as well as the proximal profunda femoris and SFA. The pelvis is not imaged. Nonvascular: Gas within the soft tissues of the left upper arm adjacent to the fistula. No pooling of contrast or extravasation of contrast. Edema within the fat planes adjacent to the musculature of the upper arm without fluid collection. Degenerative changes of the glenohumeral joint, acromioclavicular joint. No acute displaced fracture. Review of the MIP images confirms the above findings. IMPRESSION: CT angiogram demonstrates a patent left upper extremity arteriovenous dialysis circuit. If there is concern for hemodynamic assessment, directed duplex is a more sensitive and specific test, and should be considered if there is a suspicion of flow abnormality. Gas and edema within the soft tissues of the left upper arm adjacent to the dialysis circuit. While this is presumably related to usage/cannulation, soft tissue infection/cellulitis can not be excluded on CT. Signed, Dulcy Fanny. Dellia Nims, RPVI [facility] Electronically Signed   By: Corrie Mckusick D.O.   On: 02/25/2021 10:17   MR ANGIO HEAD WO CONTRAST  Result Date: 03/17/2021 CLINICAL DATA:  Neuro deficit, acute, stroke suspected EXAM: MRI HEAD WITHOUT CONTRAST MRA HEAD WITHOUT CONTRAST MRA NECK WITHOUT CONTRAST TECHNIQUE: Multiplanar, multiecho pulse sequences of the brain and surrounding structures were obtained without intravenous contrast. Angiographic images of the Circle of Willis were obtained using MRA technique without intravenous contrast. Angiographic images of the neck were obtained using MRA technique without intravenous contrast. Carotid stenosis measurements (when applicable) are obtained utilizing NASCET criteria, using the distal internal  carotid diameter as the denominator. COMPARISON:  None. FINDINGS: MRI HEAD FINDINGS Brain: No acute infarct, mass effect or extra-axial collection. No acute or chronic hemorrhage. Hyperintense T2-weighted signal is moderately widespread throughout the white matter. Generalized volume loss without a clear lobar predilection. There is a large, old right MCA territory infarct. Multiple old small vessel infarcts  of the cerebellum. Vascular: Major flow voids are preserved. Skull and upper cervical spine: Normal calvarium and skull base. Visualized upper cervical spine and soft tissues are normal. Sinuses/Orbits:No paranasal sinus fluid levels or advanced mucosal thickening. No mastoid or middle ear effusion. Normal orbits. MRA HEAD FINDINGS POSTERIOR CIRCULATION: --Vertebral arteries: Narrowed distal right V4 segment. Normal left. Left side is dominant. --Inferior cerebellar arteries: Normal. --Basilar artery: Normal. --Superior cerebellar arteries: Normal. --Posterior cerebral arteries: Normal. ANTERIOR CIRCULATION: --Intracranial internal carotid arteries: Normal. --Anterior cerebral arteries (ACA): Normal. --Middle cerebral arteries (MCA): Normal. ANATOMIC VARIANTS: Fetal origins of both PCAs. MRA NECK FINDINGS Time-of-flight imaging of the neck is degraded by motion. The vertebral system is left dominant. Both V2 segments are patent aside from 1 area in the mid right V2 segment that is likely an artifact of motion or flow direction. There is no stenosis or acute abnormality of either carotid system. IMPRESSION: 1. No acute intracranial abnormality. 2. 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 3. No emergent large vessel occlusion or high-grade stenosis. 4. Motion degraded MRA of the neck without hemodynamically significant stenosis. Electronically Signed   By: Ulyses Jarred M.D.   On: 03/17/2021 22:27   MR ANGIO HEAD WO CONTRAST  Result Date:  02/25/2021 CLINICAL DATA:  Follow-up examination for acute stroke. EXAM: MRA HEAD WITHOUT CONTRAST TECHNIQUE: Angiographic images of the Circle of Willis were acquired using MRA technique without intravenous contrast. COMPARISON:  Prior MRI from earlier the same day. FINDINGS: Anterior circulation: Examination moderately degraded by motion artifact. Visualized distal cervical segments of the internal carotid arteries are patent with antegrade flow. Partially visualized cervical left ICA tortuous. Petrous segments patent bilaterally. Cavernous and supraclinoid right ICA widely patent without stenosis. 4 mm saccular outpouching arising from the cavernous right ICA consistent with a small paraophthalmic aneurysm (series 9, image 119). Atheromatous change within the left carotid siphon with associated mild-to-moderate stenosis (series 9, image 119). Left A1 widely patent. Right A1 hypoplastic and/or absent. Normal anterior communicating artery complex. Anterior cerebral arteries grossly patent to their distal aspects without stenosis. Left M1 patent. Grossly normal left MCA bifurcation. Distal left MCA branches perfused. Right M1 segment. There remains a patent small anterior right temporal branch. 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 Posterior circulation: Dominant left vertebral artery patent to the vertebrobasilar junction without stenosis. Left PICA patent. Right vertebral artery largely terminates at the level of the right PICA, although a tiny branch ascending towards the vertebrobasilar junction. Right PICA patent as well. Basilar mildly diminutive but patent to its distal aspect without stenosis. Superior cerebral arteries patent bilaterally. Fetal type origin of the PCAs bilaterally. PCAs remain well perfused to their distal aspects without obvious proximal high-grade stenosis. Anatomic variants: Fetal type origin of the PCAs. Dominant left vertebral  artery. Hypoplastic/absent right A1 segment, with the anterior cerebral artery supplied via the left carotid artery system. Other: None. IMPRESSION: 1. 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 2. Atheromatous change within the left carotid siphon with associated mild-to-moderate stenosis. 3. 4 mm cavernous right ICA aneurysm. 4. Fetal type origin of the PCAs bilaterally. Hypoplastic/absent right A1, with the anterior cerebral arteries supplied primarily via the left carotid artery system. Electronically Signed   By: Jeannine Boga M.D.   On: 02/25/2021 23:44   MR ANGIO NECK WO CONTRAST  Result Date: 03/17/2021 CLINICAL DATA:  Neuro deficit, acute, stroke suspected EXAM: MRI HEAD WITHOUT CONTRAST MRA HEAD WITHOUT CONTRAST MRA NECK WITHOUT CONTRAST TECHNIQUE: Multiplanar, multiecho pulse sequences of the brain and surrounding structures were obtained without intravenous contrast. Angiographic images of the Circle of Willis were obtained using MRA technique without intravenous contrast. Angiographic images of the neck were obtained using MRA technique without intravenous contrast. Carotid stenosis measurements (when applicable) are obtained utilizing NASCET criteria, using the distal internal carotid diameter as the denominator. COMPARISON:  None. FINDINGS: MRI HEAD FINDINGS Brain: No acute infarct, mass effect or extra-axial collection. No acute or chronic hemorrhage. Hyperintense T2-weighted signal is moderately widespread throughout the white matter. Generalized volume loss without a clear lobar predilection. There is a large, old right MCA territory infarct. Multiple old small vessel infarcts of the cerebellum. Vascular: Major flow voids are preserved. Skull and upper cervical spine: Normal calvarium and skull base. Visualized upper cervical spine and soft tissues are normal. Sinuses/Orbits:No paranasal sinus fluid levels or advanced mucosal thickening. No mastoid or  middle ear effusion. Normal orbits. MRA HEAD FINDINGS POSTERIOR CIRCULATION: --Vertebral arteries: Narrowed distal right V4 segment. Normal left. Left side is dominant. --Inferior cerebellar arteries: Normal. --Basilar artery: Normal. --Superior cerebellar arteries: Normal. --Posterior cerebral arteries: Normal. ANTERIOR CIRCULATION: --Intracranial internal carotid arteries: Normal. --Anterior cerebral arteries (ACA): Normal. --Middle cerebral arteries (MCA): Normal. ANATOMIC VARIANTS: Fetal origins of both PCAs. MRA NECK FINDINGS Time-of-flight imaging of the neck is degraded by motion. The vertebral system is left dominant. Both V2 segments are patent aside from 1 area in the mid right V2 segment that is likely an artifact of motion or flow direction. There is no stenosis or acute abnormality of either carotid system. IMPRESSION: 1. No acute intracranial abnormality. 2. 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 3. No emergent large vessel occlusion or high-grade stenosis. 4. Motion degraded MRA of the neck without hemodynamically significant stenosis. Electronically Signed   By: Ulyses Jarred M.D.   On: 03/17/2021 22:27   MR BRAIN WO CONTRAST  Result Date: 03/17/2021 CLINICAL DATA:  Neuro deficit, acute, stroke suspected EXAM: MRI HEAD WITHOUT CONTRAST MRA HEAD WITHOUT CONTRAST MRA NECK WITHOUT CONTRAST TECHNIQUE: Multiplanar, multiecho pulse sequences of the brain and surrounding structures were obtained without intravenous contrast. Angiographic images of the Circle of Willis were obtained using MRA technique without intravenous contrast. Angiographic images of the neck were obtained using MRA technique without intravenous contrast. Carotid stenosis measurements (when applicable) are obtained utilizing NASCET criteria, using the distal internal carotid diameter as the denominator. COMPARISON:  None. FINDINGS: MRI HEAD FINDINGS Brain: No acute infarct,  mass effect or extra-axial collection. No acute or chronic hemorrhage. Hyperintense T2-weighted signal is moderately widespread throughout the white matter. Generalized volume loss without a clear lobar predilection. There is a large, old right MCA territory infarct. Multiple old small vessel infarcts of the cerebellum. Vascular: Major flow voids are preserved. Skull and upper cervical spine: Normal calvarium and skull base. Visualized upper cervical spine and soft tissues are normal. Sinuses/Orbits:No paranasal sinus fluid levels or advanced mucosal thickening. No mastoid or middle ear effusion. Normal orbits. MRA HEAD FINDINGS POSTERIOR CIRCULATION: --Vertebral arteries: Narrowed distal right V4 segment. Normal left. Left side is dominant. --Inferior cerebellar arteries: Normal. --Basilar artery: Normal. --Superior cerebellar arteries: Normal. --Posterior cerebral arteries: Normal. ANTERIOR CIRCULATION: --Intracranial internal carotid arteries: Normal. --Anterior cerebral arteries (ACA): Normal. --Middle cerebral arteries (MCA): Normal. ANATOMIC VARIANTS: Fetal origins of both PCAs. MRA NECK FINDINGS  Time-of-flight imaging of the neck is degraded by motion. The vertebral system is left dominant. Both V2 segments are patent aside from 1 area in the mid right V2 segment that is likely an artifact of motion or flow direction. There is no stenosis or acute abnormality of either carotid system. IMPRESSION: 1. No acute intracranial abnormality. 2. 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 3. No emergent large vessel occlusion or high-grade stenosis. 4. Motion degraded MRA of the neck without hemodynamically significant stenosis. Electronically Signed   By: Ulyses Jarred M.D.   On: 03/17/2021 22:27   MR BRAIN WO CONTRAST  Result Date: 02/25/2021 CLINICAL DATA:  Stroke suspected EXAM: MRI HEAD WITHOUT CONTRAST TECHNIQUE: Multiplanar, multiecho pulse sequences of  the brain and surrounding structures were obtained without intravenous contrast. COMPARISON:  09/12/2020 MRI, correlation is also made with CT head 02/25/2021. FINDINGS: Evaluation somewhat limited by motion artifact. Brain: Small area of cortical restricted diffusion in the right parietal lobe (series 5, images 33-40), within area of encephalomalacia from prior right MCA territory infarct. No acute hemorrhage, mass, mass effect, or midline shift. No extra-axial fluid collection. Redemonstrated chronic microhemorrhages in the bilateral deep gray nuclei. Confluent T2 hyperintense signal in the periventricular white matter and pons, likely the sequela of here chronic small vessel ischemic disease. Unchanged ex vacuo dilatation of the right lateral ventricle. Vascular: 08/21/2019 Skull and upper cervical spine: Normal marrow signal. Sinuses/Orbits: Status post bilateral lens replacements. Otherwise negative. Other: Trace fluid in right mastoid air cells. IMPRESSION: Evaluation is somewhat limited by motion artifact. Within this limitation, there is acute ischemia within an area of remote infarct in the right parietal lobe. No evidence of hemorrhagic transformation. These results were called by telephone at the time of interpretation on 02/25/2021 at 8:16 am to provider [facility] , who verbally acknowledged these results. Electronically Signed   By: Merilyn Baba M.D.   On: 02/25/2021 11:18   US Carotid Bilateral  Result Date: 02/26/2021 CLINICAL DATA:  Stroke symptoms, syncope hyperlipidemia, diabetes EXAM: BILATERAL CAROTID DUPLEX ULTRASOUND TECHNIQUE: Pearline Cables scale imaging, color Doppler and duplex ultrasound were performed of bilateral carotid and vertebral arteries in the neck. COMPARISON:  None. FINDINGS: Criteria: Quantification of carotid stenosis is based on velocity parameters that correlate the residual internal carotid diameter with NASCET-based stenosis levels, using the diameter of the distal internal  carotid lumen as the denominator for stenosis measurement. The following velocity measurements were obtained: RIGHT ICA: 76/12 cm/sec CCA: 02/7 cm/sec SYSTOLIC ICA/CCA RATIO:  1.4 ECA: 51 cm/sec LEFT ICA: 86/19 cm/sec CCA: 74/1 cm/sec SYSTOLIC ICA/CCA RATIO:  1.3 ECA: 161 cm/sec RIGHT CAROTID ARTERY: Scattered mild-to-moderate echogenic shadowing plaque formation. No hemodynamically significant right ICA stenosis, velocity elevation, or turbulent flow. Degree of narrowing less than 50%. RIGHT VERTEBRAL ARTERY:  Normal antegrade flow LEFT CAROTID ARTERY: Similar scattered moderate echogenic plaque formation. No hemodynamically significant left ICA stenosis, velocity elevation, or turbulent flow. LEFT VERTEBRAL ARTERY:  Normal antegrade flow IMPRESSION: Moderate bilateral carotid atherosclerosis. Negative for significant stenosis. Degree of narrowing less than 50% bilaterally by ultrasound criteria. Patent antegrade vertebral flow bilaterally Electronically Signed   By: Jerilynn Mages.  Shick M.D.   On: 02/26/2021 07:26   US Venous Img Upper Uni Left (DVT)  Result Date: 02/27/2021 CLINICAL DATA:  Left upper extremity edema. Patient is status post ligation of arteriovenous graft on 02/26/2021. EXAM: LEFT UPPER EXTREMITY VENOUS DOPPLER ULTRASOUND TECHNIQUE: Gray-scale sonography with graded compression, as well  as color Doppler and duplex ultrasound were performed to evaluate the upper extremity deep venous system from the level of the subclavian vein and including the jugular, axillary, basilic, radial, ulnar and upper cephalic vein. Spectral Doppler was utilized to evaluate flow at rest and with distal augmentation maneuvers. COMPARISON:  None. FINDINGS: Contralateral Subclavian Vein: Respiratory phasicity is normal and symmetric with the symptomatic side. No evidence of thrombus. Normal compressibility. Internal Jugular Vein: No evidence of thrombus. Normal compressibility, respiratory phasicity and response to augmentation.  Subclavian Vein: No evidence of thrombus. Normal compressibility, respiratory phasicity and response to augmentation. Axillary Vein: No evidence of thrombus. Normal compressibility, respiratory phasicity and response to augmentation. Cephalic Vein: No evidence of thrombus. Normal compressibility, respiratory phasicity and response to augmentation. Basilic Vein: No evidence of thrombus. Normal compressibility, respiratory phasicity and response to augmentation. Brachial Veins: No evidence of thrombus. Normal compressibility, respiratory phasicity and response to augmentation. Radial Veins: No evidence of thrombus. Normal compressibility, respiratory phasicity and response to augmentation. Ulnar Veins: No evidence of thrombus. Normal compressibility, respiratory phasicity and response to augmentation. Venous Reflux:  None visualized. Other Findings: Occluded arteriovenous graft visualized. Irregular amorphous complex fluid partially encircles the graft. There are areas of more solid or cystic component. No evidence of internal vascularity. No evidence of peripheral hyperemia. IMPRESSION: No evidence of DVT within the left upper extremity. Occluded left upper extremity arteriovenous graft with complex fluid partially encircling the graft in the proximal upper arm. Differential considerations include postoperative hematoma versus abscess (in the appropriate clinical setting). Given the sonographic appearance, hematoma is favored. Electronically Signed   By: Jacqulynn Cadet M.D.   On: 02/27/2021 11:05   DG Chest Port 1 View  Result Date: 03/16/2021 CLINICAL DATA:  Syncopal episode. EXAM: PORTABLE CHEST 1 VIEW COMPARISON:  Nov 09, 2013 FINDINGS: A right-sided venous catheter is seen with its distal tip noted within the right atrium. Its position is difficult to determine due to a markedly elevated right hemidiaphragm. Low lung volumes are seen. Mild right infrahilar linear scarring and/or atelectasis is noted with a  trace amount of linear scarring and/or atelectasis also seen along the lateral aspect of the left lung base. There is no evidence of a pleural effusion or pneumothorax. The heart size and mediastinal contours are within normal limits. A radiopaque loop recorder device is seen overlying the lateral aspect of the left lung base. Degenerative changes seen throughout the thoracic spine. A deformity of indeterminate age is seen involving the inferior aspect of the left glenoid. IMPRESSION: 1. Low lung volumes with mild right infrahilar and left basilar linear scarring and/or atelectasis. Electronically Signed   By: Virgina Norfolk M.D.   On: 03/16/2021 22:46   [place] OR NERVE BLOCK-IMAGE ONLY [facility])  Result Date: 02/24/2021 There is no interpretation for this exam.  This order is for images obtained during a surgical procedure.  Please See "Surgeries" Tab for more information regarding the procedure.   ECHOCARDIOGRAM COMPLETE BUBBLE STUDY  Result Date: 03/18/2021    ECHOCARDIOGRAM REPORT   Patient Name:   PORSHE FLEAGLE Date of Exam: 03/18/2021 Medical Rec #:  [id number]     Height:       65.0 in Accession #:    [id number]    Weight:       144.7 lb Date of Birth:  Jun 03, 1934      BSA:          1.724 m Patient Age:    61 years  BP:           156/71 mmHg Patient Gender: F             HR:           68 bpm. Exam Location:  [facility] Procedure: 2D Echo, Cardiac Doppler and Color Doppler Indications:     Syncope 780.2 / R55  History:         Patient has prior history of Echocardiogram examinations, most                  recent 09/11/2020. Arrythmias:Atrial Fibrillation; Risk                  Factors:Hypertension and Diabetes.  Sonographer:     Sherrie Sport Referring Phys:  [place] Diagnosing Phys: Ida Rogue MD  Sonographer Comments: Suboptimal parasternal window. IMPRESSIONS  1. Left ventricular ejection fraction, by estimation, is 60 to 65%. The left ventricle has normal function. The left ventricle has no  regional wall motion abnormalities. There is mild left ventricular hypertrophy. Left ventricular diastolic parameters are consistent with Grade I diastolic dysfunction (impaired relaxation).  2. Right ventricular systolic function is normal. The right ventricular size is normal.  3. The mitral valve is normal in structure. No evidence of mitral valve regurgitation. No evidence of mitral stenosis.  4. The aortic valve is normal in structure. Aortic valve regurgitation is not visualized. No aortic stenosis is present.  5. The inferior vena cava is normal in size with greater than 50% respiratory variability, suggesting right atrial pressure of 3 mmHg. FINDINGS  Left Ventricle: Left ventricular ejection fraction, by estimation, is 60 to 65%. The left ventricle has normal function. The left ventricle has no regional wall motion abnormalities. The left ventricular internal cavity size was normal in size. There is  mild left ventricular hypertrophy. Left ventricular diastolic parameters are consistent with Grade I diastolic dysfunction (impaired relaxation). Right Ventricle: The right ventricular size is normal. No increase in right ventricular wall thickness. Right ventricular systolic function is normal. Left Atrium: Left atrial size was normal in size. Right Atrium: Right atrial size was normal in size. Pericardium: There is no evidence of pericardial effusion. Mitral Valve: The mitral valve is normal in structure. No evidence of mitral valve regurgitation. No evidence of mitral valve stenosis. Tricuspid Valve: The tricuspid valve is normal in structure. Tricuspid valve regurgitation is not demonstrated. No evidence of tricuspid stenosis. Aortic Valve: The aortic valve is normal in structure. Aortic valve regurgitation is not visualized. No aortic stenosis is present. Aortic valve mean gradient measures 4.0 mmHg. Aortic valve peak gradient measures 5.6 mmHg. Aortic valve area, by VTI measures 2.41 cm. Pulmonic Valve:  The pulmonic valve was normal in structure. Pulmonic valve regurgitation is not visualized. No evidence of pulmonic stenosis. Aorta: The aortic root is normal in size and structure. Venous: The inferior vena cava is normal in size with greater than 50% respiratory variability, suggesting right atrial pressure of 3 mmHg. IAS/Shunts: No atrial level shunt detected by color flow Doppler.  LEFT VENTRICLE PLAX 2D LVIDd:         4.00 cm  Diastology LVIDs:         2.80 cm  LV e' medial:    6.31 cm/s LV PW:         1.80 cm  LV E/e' medial:  12.6 LV IVS:        1.40 cm  LV e' lateral:   6.64 cm/s  LVOT diam:     2.00 cm  LV E/e' lateral: 12.0 LV SV:         77 LV SV Index:   45 LVOT Area:     3.14 cm  RIGHT VENTRICLE RV S prime:     13.30 cm/s TAPSE (M-mode): 3.1 cm LEFT ATRIUM             Index       RIGHT ATRIUM          Index LA diam:        3.70 cm 2.15 cm/m  RA Area:     7.55 cm LA Vol (A2C):   31.5 ml 18.27 ml/m RA Volume:   14.60 ml 8.47 ml/m LA Vol (A4C):   38.8 ml 22.51 ml/m LA Biplane Vol: 36.6 ml 21.23 ml/m  AORTIC VALVE AV Area (Vmax):    2.82 cm AV Area (Vmean):   2.34 cm AV Area (VTI):     2.41 cm AV Vmax:           118.00 cm/s AV Vmean:          88.900 cm/s AV VTI:            0.321 m AV Peak Grad:      5.6 mmHg AV Mean Grad:      4.0 mmHg LVOT Vmax:         106.00 cm/s LVOT Vmean:        66.100 cm/s LVOT VTI:          0.246 m LVOT/AV VTI ratio: 0.77  AORTA Ao Root diam: 3.40 cm MITRAL VALVE                TRICUSPID VALVE MV Area (PHT): 3.68 cm     TR Peak grad:   12.4 mmHg MV Decel Time: 206 msec     TR Vmax:        176.00 cm/s MV E velocity: 79.70 cm/s MV A velocity: 101.00 cm/s  SHUNTS MV E/A ratio:  0.79         Systemic VTI:  0.25 m                             Systemic Diam: 2.00 cm Ida Rogue MD Electronically signed by Ida Rogue MD Signature Date/Time: 03/18/2021/3:00:27 PM    Final    VAS [place] UPPER EXTREMITY VENOUS DUPLEX  Result Date: 03/10/2021 UPPER VENOUS STUDY  Patient Name:   VERLENE GLANTZ  Date of Exam:   03/09/2021 Medical Rec #: [id number]      Accession #:    [id number] Date of Birth: [birth year]       Patient Gender: F Patient Age:   18 years Exam Location:  [facility] Procedure:      VAS [place] UPPER EXTREMITY VENOUS DUPLEX Referring Phys: Eulogio Ditch --------------------------------------------------------------------------------  Indications: Swelling Performing Technologist: Almira Coaster RVS  Examination Guidelines: A complete evaluation includes B-mode imaging, spectral Doppler, color Doppler, and power Doppler as needed of all accessible portions of each vessel. Bilateral testing is considered an integral part of a complete examination. Limited examinations for reoccurring indications may be performed as noted.  Left Findings: +----------+------------+---------+-----------+----------+-------+ LEFT      CompressiblePhasicitySpontaneousPropertiesSummary +----------+------------+---------+-----------+----------+-------+ IJV           Full       Yes       Yes                      +----------+------------+---------+-----------+----------+-------+  Subclavian    Full       Yes       Yes                      +----------+------------+---------+-----------+----------+-------+ Axillary      Full       Yes       Yes                      +----------+------------+---------+-----------+----------+-------+ Brachial      Full       Yes       Yes                      +----------+------------+---------+-----------+----------+-------+ Radial        Full       Yes       Yes                      +----------+------------+---------+-----------+----------+-------+ Ulnar         Full       Yes       Yes                      +----------+------------+---------+-----------+----------+-------+ Cephalic      Full       Yes       Yes                      +----------+------------+---------+-----------+----------+-------+ Basilic       Full        Yes       Yes                      +----------+------------+---------+-----------+----------+-------+  Summary:  Left: There appears to be no evidence of DVT seen in the Left Upper Extremity. The Proximal and Mid Forearm area was not visualized due to Gauze and Bandage. The Left Upper Arm Brachial Axillary AVG appears to be patent.  *See table(s) above for measurements and observations.  Diagnosing physician: Hortencia Pilar MD Electronically signed by Hortencia Pilar MD on 03/10/2021 at 5:07:45 PM.    Final     Microbiology: Recent Results (from the past 240 hour(s))  Resp Panel by RT-PCR (Flu A&B, Covid) Nasopharyngeal Swab     Status: None   Collection Time: 03/17/21  2:04 AM   Specimen: Nasopharyngeal Swab; Nasopharyngeal(NP) swabs in vial transport medium  Result Value Ref Range Status   SARS Coronavirus 2 by RT PCR NEGATIVE NEGATIVE Final    Comment: (NOTE) SARS-CoV-2 target nucleic acids are NOT DETECTED.  The SARS-CoV-2 RNA is generally detectable in upper respiratory specimens during the acute phase of infection. The lowest concentration of SARS-CoV-2 viral copies this assay can detect is 138 copies/mL. A negative result does not preclude SARS-Cov-2 infection and should not be used as the sole basis for treatment or other patient management decisions. A negative result may occur with  improper specimen collection/handling, submission of specimen other than nasopharyngeal swab, presence of viral mutation(s) within the areas targeted by this assay, and inadequate number of viral copies(<138 copies/mL). A negative result must be combined with clinical observations, patient history, and epidemiological information. The expected result is Negative.  Fact Sheet for Patients:  EntrepreneurPulse.com.au  Fact Sheet for Healthcare Providers:  IncredibleEmployment.be  This test is no t yet approved or cleared by the [place] and  has been  authorized for  detection and/or diagnosis of SARS-CoV-2 by FDA under an Emergency Use Authorization (EUA). This EUA will remain  in effect (meaning this test can be used) for the duration of the COVID-19 declaration under Section 564(b)(1) of the Act, 21 U.S.C.section 360bbb-3(b)(1), unless the authorization is terminated  or revoked sooner.       Influenza A by PCR NEGATIVE NEGATIVE Final   Influenza B by PCR NEGATIVE NEGATIVE Final    Comment: (NOTE) The Xpert Xpress SARS-CoV-2/FLU/RSV plus assay is intended as an aid in the diagnosis of influenza from Nasopharyngeal swab specimens and should not be used as a sole basis for treatment. Nasal washings and aspirates are unacceptable for Xpert Xpress SARS-CoV-2/FLU/RSV testing.  Fact Sheet for Patients: EntrepreneurPulse.com.au  Fact Sheet for Healthcare Providers: IncredibleEmployment.be  This test is not yet approved or cleared by the [place] FDA and has been authorized for detection and/or diagnosis of SARS-CoV-2 by FDA under an Emergency Use Authorization (EUA). This EUA will remain in effect (meaning this test can be used) for the duration of the COVID-19 declaration under Section 564(b)(1) of the Act, 21 U.S.C. section 360bbb-3(b)(1), unless the authorization is terminated or revoked.  Performed at [facility], [address] [place], [place] [address]      Labs: CBC: Recent Labs  Lab 03/16/21 2245 03/17/21 0657 03/18/21 0911  WBC 3.8* 3.2* 3.0*  HGB 8.7* 7.4* 7.7*  HCT 25.3* 21.6* 21.9*  MCV 91.7 90.0 90.9  PLT 159 147* 962*   Basic Metabolic Panel: Recent Labs  Lab 03/16/21 2146 03/17/21 0657 03/18/21 0911  NA 136 140 138  K 3.0* 3.3* 3.5  CL 93* 97* 100  CO2 32 33* 32  GLUCOSE 148* 108* 125*  BUN 9 13 13   CREATININE 2.02* 2.52* 2.10*  CALCIUM 7.9* 8.0* 8.3*  MG  --   --  1.7  PHOS  --   --  2.2*   Liver Function Tests: No results for  input(s): AST, ALT, ALKPHOS, BILITOT, PROT, ALBUMIN in the last 168 hours. No results for input(s): LIPASE, AMYLASE in the last 168 hours. No results for input(s): AMMONIA in the last 168 hours. Cardiac Enzymes: No results for input(s): CKTOTAL, CKMB, CKMBINDEX, TROPONINI in the last 168 hours. BNP (last 3 results) No results for input(s): BNP in the last 8760 hours. CBG: Recent Labs  Lab 03/17/21 0726 03/17/21 1203 03/17/21 1655 03/17/21 2030 03/18/21 1304  GLUCAP 116* 150* 137* 113* 117*    Time spent: 35 minutes  Signed:  Val Riles  [facility]  03/18/2021 4:09 PM

## 2021-03-18 NOTE — Progress Notes (Signed)
Patient discharged home via wheelchair, accompanied by family. Discharge teaching completed and included: new medication regimen, follow-up appointment information, activity restrictions, and S&S to report/return with immediately. Patient has dementia and discharge teaching wad completed with daughter. Daughter verbalizes understanding and asks appropriate questions.

## 2021-03-18 NOTE — TOC Transition Note (Signed)
Transition of Care [facility]) - CM/SW Discharge Note   Patient Details  Name: Brianna Dodson MRN: [id number] Date of Birth: [birth year]  [facility] CM/SW Contact:  Candie Chroman, LCSW Phone Number: 03/18/2021, 4:22 PM   Clinical Narrative:   Patient has orders to discharge home today. [facility] representative is aware. No further concerns. CSW signing off.  Final next level of care: Home w Home Health Services Barriers to Discharge: No Barriers Identified   Patient Goals and CMS Choice     Choice offered to / list presented to : NA  Discharge Placement                    Patient and family notified of of transfer: 03/18/21  Discharge Plan and Services     Post Acute Care Choice: Resumption of Svcs/PTA Provider                    HH Arranged: RN, PT [facility] Agency: [place] Date [place]: 03/18/21   Representative spoke with at [place]: Glennis Brink  Social Determinants of Health (SDOH) Interventions     Readmission Risk Interventions Readmission Risk Prevention Plan 03/03/2021 03/16/2019  Transportation Screening Complete Complete  PCP or Specialist Appt within 5-7 Days - Complete  PCP or Specialist Appt within 3-5 Days Complete -  Home Care Screening - Complete  Medication Review (RN CM) - Complete  HRI or Home Care Consult Complete -  Social Work Consult for [place] Planning/Counseling Not Complete -  SW consult not completed comments RNCM assigned to patient -  Palliative Care Screening Not Applicable -  Medication Review Press photographer) Complete -  Some recent data might be hidden

## 2021-03-18 NOTE — TOC Initial Note (Signed)
Transition of Care [facility]) - Initial/Assessment Note    Patient Details  Name: Brianna Dodson MRN: [id number] Date of Birth: [birth year]  Transition of Care [facility]) CM/SW Contact:    Candie Chroman, LCSW Phone Number: 03/18/2021, 12:54 PM  Clinical Narrative:   Patient not fully oriented and in HD. Left voicemail for patient's husband. Called patient's sister, introduced role, and explained that discharge planning would be discussed. Patient's sister confirmed she is active with [facility]. She recently discharged from [facility]. [facility] representative confirmed she is receiving PT and RN services. Sister stated she thinks patient will likely discharge tomorrow and she will transport her home. No further concerns. CSW encouraged patient's sister to contact CSW as needed. CSW will continue to follow patient and her family for support and facilitate return home when stable.               Expected Discharge Plan: [place] Barriers to Discharge: Continued Medical Work up   Patient Goals and CMS Choice     Choice offered to / list presented to : NA  Expected Discharge Plan and Services Expected Discharge Plan: [place] Choice: Resumption of Svcs/PTA Provider Living arrangements for the past 2 months: Single Family Home                           HH Arranged: RN, PT [facility] Agency: [place] Date [place]: 03/18/21   Representative spoke with at [place]: Glennis Brink  Prior Living Arrangements/Services Living arrangements for the past 2 months: [place] Lives with:: Spouse Patient language and need for interpreter reviewed:: Yes Do you feel safe going back to the place where you live?: Yes      Need for Family Participation in Patient Care: Yes (Comment) Care giver support system in place?: Yes (comment) Current home services: Home PT, Home RN Criminal Activity/Legal Involvement  Pertinent to Current Situation/Hospitalization: No - Comment as needed  Activities of Daily Living      Permission Sought/Granted Permission sought to share information with : Facility Sport and exercise psychologist, Family Supports    Share Information with NAME: Augustin Schooling  Permission granted to share info w AGENCY: [place] granted to share info w Relationship: Sister  Permission granted to share info w Contact Information: [phone]  Emotional Assessment Appearance:: Appears stated age Attitude/Demeanor/Rapport: Unable to Assess Affect (typically observed): Unable to Assess Orientation: : Oriented to Self, Oriented to Place Alcohol / Substance Use: Not Applicable Psych Involvement: No (comment)  Admission diagnosis:  Syncope [R55] Syncope, unspecified syncope type [R55] Recurrent syncope [R55] Patient Active Problem List   Diagnosis Date Noted   Syncope and collapse 03/16/2021   Chronic anticoagulation 03/16/2021   Hypotension 03/16/2021   Recurrent syncope 03/16/2021   Pressure injury of sacral region, stage 2 ([place]) 03/15/2021   Renal dialysis device, implant, or graft complication    Left arm pain 02/25/2021   Thrombocytopenia ([place]) 02/25/2021   Other disorders of phosphorus metabolism 01/12/2021   Coagulation defect, unspecified ([place]) 10/12/2020   Diarrhea, unspecified 10/12/2020   Encounter for immunization 10/12/2020   End stage renal disease on dialysis ([place]) 10/12/2020   Fever, unspecified 10/12/2020   Headache, unspecified 10/12/2020   Iron deficiency anemia, unspecified 10/12/2020   Localized edema 10/12/2020   Nontoxic single thyroid nodule 10/12/2020   Occlusion and stenosis  of unspecified carotid artery 10/12/2020   Other fluid overload 10/12/2020   Pain, unspecified 10/12/2020   Shortness of breath 10/12/2020   Vascular dementia without behavioral disturbance ([place]) 10/12/2020   Malnutrition of moderate degree 09/13/2020    Anemia of chronic disease 09/10/2020   Sleeping difficulty 07/08/2020   Fatigue 06/09/2020   Mixed Alzheimer's and vascular dementia ([place]) 06/09/2020   Coccydynia 01/26/2020   Leukopenia 12/08/2019   Muscle strain 12/08/2019   Pedal edema 11/26/2019   Dysarthria and anarthria 11/07/2019   Atrial fibrillation ([place]) 10/21/2019   Nocturia 10/21/2019   Seborrheic keratosis 10/21/2019   Carotid stenosis 08/27/2019   Thyroid nodule 08/27/2019   Status post placement of implantable loop recorder 08/07/2019   Anemia in ESRD (end-stage renal disease) ([place]) 04/18/2019   Aphasia 04/12/2019   Benign hypertensive renal disease 04/07/2019   Hyperparathyroidism due to renal insufficiency ([place]) 04/07/2019   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, or unspecified 04/07/2019   Proteinuria 04/07/2019   History of stroke with current residual effects 03/15/2019   Hyperlipidemia associated with type 2 diabetes mellitus ([place]) 07/25/2018   Itching 10/23/2017   Nevus 04/17/2017   CKD (chronic kidney disease), stage V ([place]) 02/02/2015   Vitamin D deficiency 02/02/2015   Obesity (BMI 30-39.9) 08/03/2014   Type II diabetes mellitus with renal manifestations ([place]) 01/29/2014   Essential hypertension 01/13/2014   HLD (hyperlipidemia) 01/13/2014   PCP:  [name], MD Pharmacy:   [id number] [place], [place] [address] [address] Phone: [phone] Fax: [phone]     Social Determinants of Health (SDOH) Interventions    Readmission Risk Interventions Readmission Risk Prevention Plan 03/03/2021 03/16/2019  Transportation Screening Complete Complete  PCP or Specialist Appt within 5-7 Days - Complete  PCP or Specialist Appt within 3-5 Days Complete -  Home Care Screening - Complete  Medication Review (RN CM) - Complete  HRI or Home Care Consult Complete -  Social Work Consult for [place]  Planning/Counseling Not Complete -  SW consult not completed comments RNCM assigned to patient -  Palliative Care Screening Not Applicable -  Medication Review Press photographer) Complete -  Some recent data might be hidden

## 2021-03-19 LAB — HEPATITIS B SURFACE ANTIBODY, QUANTITATIVE: Hep B S AB Quant (Post): >1000 m[IU]/mL (ref >9.9)

## 2021-03-21 ENCOUNTER — Telehealth: Payer: Self-pay

## 2021-03-21 DIAGNOSIS — D509 Iron deficiency anemia, unspecified: Secondary | ICD-10-CM | POA: Diagnosis not present

## 2021-03-21 DIAGNOSIS — N2581 Secondary hyperparathyroidism of renal origin: Secondary | ICD-10-CM | POA: Diagnosis not present

## 2021-03-21 DIAGNOSIS — N186 End stage renal disease: Secondary | ICD-10-CM | POA: Diagnosis not present

## 2021-03-21 DIAGNOSIS — Z23 Encounter for immunization: Secondary | ICD-10-CM | POA: Diagnosis not present

## 2021-03-21 DIAGNOSIS — Z992 Dependence on renal dialysis: Secondary | ICD-10-CM | POA: Diagnosis not present

## 2021-03-21 DIAGNOSIS — D631 Anemia in chronic kidney disease: Secondary | ICD-10-CM | POA: Diagnosis not present

## 2021-03-21 NOTE — Telephone Encounter (Signed)
Transition Care Management Unsuccessful Follow-up Telephone Call  Date of discharge and from where:  03/18/21-[facility]  Attempts:  1st Attempt  Reason for unsuccessful TCM follow-up call:  Unable to reach patient

## 2021-03-22 ENCOUNTER — Telehealth (INDEPENDENT_AMBULATORY_CARE_PROVIDER_SITE_OTHER): Payer: Self-pay

## 2021-03-22 DIAGNOSIS — N186 End stage renal disease: Secondary | ICD-10-CM | POA: Diagnosis not present

## 2021-03-22 DIAGNOSIS — E1122 Type 2 diabetes mellitus with diabetic chronic kidney disease: Secondary | ICD-10-CM | POA: Diagnosis not present

## 2021-03-22 DIAGNOSIS — I120 Hypertensive chronic kidney disease with stage 5 chronic kidney disease or end stage renal disease: Secondary | ICD-10-CM | POA: Diagnosis not present

## 2021-03-22 DIAGNOSIS — D631 Anemia in chronic kidney disease: Secondary | ICD-10-CM | POA: Diagnosis not present

## 2021-03-22 DIAGNOSIS — I63431 Cerebral infarction due to embolism of right posterior cerebral artery: Secondary | ICD-10-CM | POA: Diagnosis not present

## 2021-03-22 DIAGNOSIS — F0280 Dementia in other diseases classified elsewhere without behavioral disturbance: Secondary | ICD-10-CM | POA: Diagnosis not present

## 2021-03-22 DIAGNOSIS — G309 Alzheimer's disease, unspecified: Secondary | ICD-10-CM | POA: Diagnosis not present

## 2021-03-22 NOTE — Telephone Encounter (Signed)
Fluid pills will not help with the swelling. Plus she's a dialysis patient, fluid pills aren't very effective with non-functional kidneys.  It's swollen because she can't move her hand. She could try using an ace wrap to see if it helps

## 2021-03-22 NOTE — Telephone Encounter (Signed)
Transition Care Management Unsuccessful Follow-up Telephone Call No follow up scheduled at this time per patient.   Called patient to schedule hfu per discharge note. Declined. Agrees to keep already scheduled appointment in October with pcp. Notes appointment scheduled with specialty clinic.

## 2021-03-23 ENCOUNTER — Other Ambulatory Visit: Payer: Self-pay | Admitting: Family Medicine

## 2021-03-23 DIAGNOSIS — Z992 Dependence on renal dialysis: Secondary | ICD-10-CM | POA: Diagnosis not present

## 2021-03-23 DIAGNOSIS — N186 End stage renal disease: Secondary | ICD-10-CM | POA: Diagnosis not present

## 2021-03-23 DIAGNOSIS — Z23 Encounter for immunization: Secondary | ICD-10-CM | POA: Diagnosis not present

## 2021-03-23 DIAGNOSIS — E1122 Type 2 diabetes mellitus with diabetic chronic kidney disease: Secondary | ICD-10-CM | POA: Diagnosis not present

## 2021-03-23 DIAGNOSIS — D509 Iron deficiency anemia, unspecified: Secondary | ICD-10-CM | POA: Diagnosis not present

## 2021-03-23 DIAGNOSIS — I120 Hypertensive chronic kidney disease with stage 5 chronic kidney disease or end stage renal disease: Secondary | ICD-10-CM | POA: Diagnosis not present

## 2021-03-23 DIAGNOSIS — I10 Essential (primary) hypertension: Secondary | ICD-10-CM

## 2021-03-23 DIAGNOSIS — G309 Alzheimer's disease, unspecified: Secondary | ICD-10-CM | POA: Diagnosis not present

## 2021-03-23 DIAGNOSIS — D631 Anemia in chronic kidney disease: Secondary | ICD-10-CM | POA: Diagnosis not present

## 2021-03-23 DIAGNOSIS — N2581 Secondary hyperparathyroidism of renal origin: Secondary | ICD-10-CM | POA: Diagnosis not present

## 2021-03-23 DIAGNOSIS — F0280 Dementia in other diseases classified elsewhere without behavioral disturbance: Secondary | ICD-10-CM | POA: Diagnosis not present

## 2021-03-23 NOTE — Telephone Encounter (Signed)
Home health nurse was made aware with medical recomendations

## 2021-03-25 ENCOUNTER — Ambulatory Visit (INDEPENDENT_AMBULATORY_CARE_PROVIDER_SITE_OTHER): Payer: Medicare Other | Admitting: Vascular Surgery

## 2021-03-25 DIAGNOSIS — D631 Anemia in chronic kidney disease: Secondary | ICD-10-CM | POA: Diagnosis not present

## 2021-03-25 DIAGNOSIS — Z23 Encounter for immunization: Secondary | ICD-10-CM | POA: Diagnosis not present

## 2021-03-25 DIAGNOSIS — N2581 Secondary hyperparathyroidism of renal origin: Secondary | ICD-10-CM | POA: Diagnosis not present

## 2021-03-25 DIAGNOSIS — Z992 Dependence on renal dialysis: Secondary | ICD-10-CM | POA: Diagnosis not present

## 2021-03-25 DIAGNOSIS — N186 End stage renal disease: Secondary | ICD-10-CM | POA: Diagnosis not present

## 2021-03-25 DIAGNOSIS — D509 Iron deficiency anemia, unspecified: Secondary | ICD-10-CM | POA: Diagnosis not present

## 2021-03-28 ENCOUNTER — Emergency Department
Admission: EM | Admit: 2021-03-28 | Discharge: 2021-03-28 | Disposition: A | Discharge status: Home / Self Care | Payer: Medicare Other | Attending: Emergency Medicine | Admitting: Emergency Medicine

## 2021-03-28 ENCOUNTER — Other Ambulatory Visit: Payer: Self-pay

## 2021-03-28 ENCOUNTER — Encounter: Payer: Self-pay | Admitting: Emergency Medicine

## 2021-03-28 DIAGNOSIS — F0390 Unspecified dementia without behavioral disturbance: Secondary | ICD-10-CM | POA: Diagnosis not present

## 2021-03-28 DIAGNOSIS — Z992 Dependence on renal dialysis: Secondary | ICD-10-CM | POA: Insufficient documentation

## 2021-03-28 DIAGNOSIS — N186 End stage renal disease: Secondary | ICD-10-CM | POA: Insufficient documentation

## 2021-03-28 DIAGNOSIS — Z7901 Long term (current) use of anticoagulants: Secondary | ICD-10-CM | POA: Insufficient documentation

## 2021-03-28 DIAGNOSIS — Z79899 Other long term (current) drug therapy: Secondary | ICD-10-CM | POA: Diagnosis not present

## 2021-03-28 DIAGNOSIS — D631 Anemia in chronic kidney disease: Secondary | ICD-10-CM | POA: Diagnosis not present

## 2021-03-28 DIAGNOSIS — I120 Hypertensive chronic kidney disease with stage 5 chronic kidney disease or end stage renal disease: Secondary | ICD-10-CM | POA: Diagnosis not present

## 2021-03-28 DIAGNOSIS — N2581 Secondary hyperparathyroidism of renal origin: Secondary | ICD-10-CM | POA: Diagnosis not present

## 2021-03-28 DIAGNOSIS — E1122 Type 2 diabetes mellitus with diabetic chronic kidney disease: Secondary | ICD-10-CM | POA: Insufficient documentation

## 2021-03-28 DIAGNOSIS — M79602 Pain in left arm: Secondary | ICD-10-CM

## 2021-03-28 DIAGNOSIS — D509 Iron deficiency anemia, unspecified: Secondary | ICD-10-CM | POA: Diagnosis not present

## 2021-03-28 DIAGNOSIS — Z23 Encounter for immunization: Secondary | ICD-10-CM | POA: Diagnosis not present

## 2021-03-28 LAB — COMPREHENSIVE METABOLIC PANEL
ALT: 14 U/L (ref 0–44)
AST: 26 U/L (ref 15–41)
Albumin: 3.8 g/dL (ref 3.5–5.0)
Alkaline Phosphatase: 60 U/L (ref 38–126)
Anion gap: 10 (ref 5–15)
BUN: 9 mg/dL (ref 8–23)
CO2: 31 mmol/L (ref 22–32)
Calcium: 8.4 mg/dL — ABNORMAL LOW (ref 8.9–10.3)
Chloride: 97 mmol/L — ABNORMAL LOW (ref 98–111)
Creatinine, Ser: 2.26 mg/dL — ABNORMAL HIGH (ref 0.44–1.00)
GFR, Estimated: 21 mL/min — ABNORMAL LOW (ref >60)
Glucose, Bld: 117 mg/dL — ABNORMAL HIGH (ref 70–99)
Potassium: 2.8 mmol/L — ABNORMAL LOW (ref 3.5–5.1)
Sodium: 138 mmol/L (ref 135–145)
Total Bilirubin: 1.2 mg/dL (ref 0.3–1.2)
Total Protein: 7.7 g/dL (ref 6.5–8.1)

## 2021-03-28 LAB — CBC WITH DIFFERENTIAL/PLATELET
Abs Immature Granulocytes: 0.01 10*3/uL (ref 0.00–0.07)
Basophils Absolute: 0 10*3/uL (ref 0.0–0.1)
Basophils Relative: 1 %
Eosinophils Absolute: 0.1 10*3/uL (ref 0.0–0.5)
Eosinophils Relative: 3 %
HCT: 27.3 % — ABNORMAL LOW (ref 36.0–46.0)
Hemoglobin: 9.6 g/dL — ABNORMAL LOW (ref 12.0–15.0)
Immature Granulocytes: 0 %
Lymphocytes Relative: 30 %
Lymphs Abs: 1.2 10*3/uL (ref 0.7–4.0)
MCH: 33 pg (ref 26.0–34.0)
MCHC: 35.2 g/dL (ref 30.0–36.0)
MCV: 93.8 fL (ref 80.0–100.0)
Monocytes Absolute: 0.5 10*3/uL (ref 0.1–1.0)
Monocytes Relative: 12 %
Neutro Abs: 2 10*3/uL (ref 1.7–7.7)
Neutrophils Relative %: 54 %
Platelets: 172 10*3/uL (ref 150–400)
RBC: 2.91 MIL/uL — ABNORMAL LOW (ref 3.87–5.11)
RDW: 15.9 % — ABNORMAL HIGH (ref 11.5–15.5)
WBC: 3.8 10*3/uL — ABNORMAL LOW (ref 4.0–10.5)
nRBC: 0 % (ref 0.0–0.2)

## 2021-03-28 MED ORDER — ACETAMINOPHEN 500 MG PO TABS
1000.0000 mg | ORAL_TABLET | Freq: Once | ORAL | Status: AC
  Administered 2021-03-28: 1000 mg via ORAL
  Filled 2021-03-28: qty 2

## 2021-03-28 MED ORDER — OXYCODONE HCL 5 MG PO TABS
2.5000 mg | ORAL_TABLET | Freq: Once | ORAL | Status: AC
  Administered 2021-03-28: 2.5 mg via ORAL
  Filled 2021-03-28: qty 1

## 2021-03-28 MED ORDER — OXYCODONE HCL 5 MG PO TABS: 2.5000 mg | ORAL_TABLET | Freq: Three times a day (TID) | ORAL | 0 refills | Status: AC | PRN

## 2021-03-28 NOTE — ED Notes (Signed)
Rn to bedside to introduce self to pt. Pt advised she went to dialysis today. They accessed her temporary port today NOT her new graft that was placed in left arm on 02/24/21. Pt is having pain in her left hand distal to graft placement. She seen her nephrologist today while in clinic. But unable to comprehend what she was instructed to do about her pain in her left hand,. She advised MD told her to take her pain medication but pt refused.

## 2021-03-28 NOTE — ED Provider Notes (Signed)
Emergency Medicine Provider Triage Evaluation Note  Brianna Dodson , a 87 y.o. female  was evaluated in triage.  Pt complains of with MWF dialysis presents with LUE pain and hand swelling. She notes dialysis attempted to use her LUE fistula, but were unsuccessful.   Review of Systems  Positive: LUE pain and hand swelling Negative: CP, SOB  Physical Exam  BP (!) 121/56 (BP Location: Right Arm)   Pulse 69   Temp 98.7 F (37.1 C) (Oral)   Resp 18   Ht 5\' 5"  (1.651 m)   Wt 64 kg   SpO2 98%   BMI 23.46 kg/m  Gen:   Awake, no distress  NAD Resp:  Normal effort CTA MSK:   Moves extremities without difficulty Left hand edema, forearm tenderness Other:  CVS: normal distal pulses.   Medical Decision Making  Medically screening exam initiated at 3:34 PM.  Appropriate orders placed.  Taurus Willis was informed that the remainder of the evaluation will be completed by another provider, this initial triage assessment does not replace that evaluation, and the importance of remaining in the ED until their evaluation is complete.  Patient on MWF dialysis presenting with left hand swelling after attempted AV fistula access.    Melvenia Needles, PA-C 03/28/21 1536    Duffy Bruce, MD 03/29/21 [phone]

## 2021-03-28 NOTE — ED Triage Notes (Signed)
Pt to ED with complaints of left hand pain, they tried to get dialysis access in her left arm but it did not work. She is only complaining of hand pain. It is swollen.

## 2021-03-28 NOTE — Discharge Instructions (Addendum)
Patient comes in with left arm pain.  Patient is had full work-up previously although could consider potentially something wrong with the cervical area given the wrist drop and I recommended she follows up for an outpatient MRI of her cervical spine.  Please discuss this with your primary care doctor or Dr. [name] if he thinks that this would be reasonable.  Please see him tomorrow because this also could be related to steal syndrome per the discharge note.  Return to the ER if develop worsening pain, fevers, redness or any other concern  Take Tylenol 1 g every 6 6 hours and then use the oxycodone for breakthrough pain.  Only take half a pill and only use it if you have to.  Please discuss with Dr. [name] if you need additional pain medicine  Take oxycodone as prescribed. Do not drink alcohol, drive or participate in any other potentially dangerous activities while taking this medication as it may make you sleepy. Do not take this medication with any other sedating medications, either prescription or over-the-counter. If you were prescribed Percocet or Vicodin, do not take these with acetaminophen (Tylenol) as it is already contained within these medications.  This medication is an opiate (or narcotic) pain medication and can be habit forming. Use it as little as possible to achieve adequate pain control. Do not use or use it with extreme caution if you have a history of opiate abuse or dependence. If you are on a pain contract with your primary care doctor or a pain specialist, be sure to let them know you were prescribed this medication today from the Emergency Department. This medication is intended for your use only - do not give any to anyone else and keep it in a secure place where nobody else, especially children, have access to it.

## 2021-03-28 NOTE — ED Provider Notes (Signed)
[facility] Emergency Department Provider Note  ____________________________________________   Event Date/Time   First MD Initiated Contact with Patient 03/28/21 1533     (approximate)  I have reviewed the triage vital signs and the nursing notes.   HISTORY  Chief Complaint Hand Pain    HPI Lousie Calico is a 87 y.o. female ESRD, prior stroke, on Eliquis who comes in with concerns for left hand pain.  Patient is dialysis through a right chest wall catheter.  She reports that they had consulted with her fistula over a month ago and since then that she has had swelling and inability to lift up her wrist.  Patient has wrist drop.  It appears the patient does have an appointment with Dr. [name] tomorrow.  I actually saw patient when she first came in for this and had gotten an MRI that was concerning for stroke but they did not think that this was causing the wrist drop.  It was thought that it was from steal syndrome.  They recommended PT and OT and follow-up with vascular.  Patient reports that the swelling has not gotten worse and the function has not changed at all just more so the pain that is in her wrist that is constant, not better with Tylenol, nothing makes it worse.  Denies any redness, warmth, fevers of it.          Past Medical History:  Diagnosis Date   A-fib ([place])    Alzheimer disease ([place])    Anemia    Carotid stenosis    Cerebral infarction ([place]) 04/12/2019   Multiple new foci of acute infarction affect the RIGHT hemisphere affecting the frontal, posterior frontal, posterior temporal, anterior parietal cortex and regional white matter.   Cerebral infarction due to embolism of right middle cerebral artery ([place]) 08/20/2019   RIGHT MCA distal M1   Chronic anticoagulation    Apixaban   CKD (chronic kidney disease), stage 4(HCC)    Embolic stroke involving cerebral artery ([place]) 03/15/2019   RIGHT MCA/PCA territory   Encephalomalacia    RIGHT  posterior MCA territory   HCV (hepatitis C virus)    History of blood transfusion    Hyperlipidemia    Hypertension    OSA (obstructive sleep apnea)    Status post placement of implantable loop recorder 07/30/2019   T2DM (type 2 diabetes mellitus) ([place])     Patient Active Problem List   Diagnosis Date Noted   Pressure injury of skin 03/18/2021   Syncope and collapse 03/16/2021   Chronic anticoagulation 03/16/2021   Hypotension 03/16/2021   Recurrent syncope 03/16/2021   Pressure injury of sacral region, stage 2 ([place]) 03/15/2021   Renal dialysis device, implant, or graft complication    Left arm pain 02/25/2021   Thrombocytopenia ([place]) 02/25/2021   Other disorders of phosphorus metabolism 01/12/2021   Coagulation defect, unspecified ([place]) 10/12/2020   Diarrhea, unspecified 10/12/2020   Encounter for immunization 10/12/2020   End stage renal disease on dialysis ([place]) 10/12/2020   Fever, unspecified 10/12/2020   Headache, unspecified 10/12/2020   Iron deficiency anemia, unspecified 10/12/2020   Localized edema 10/12/2020   Nontoxic single thyroid nodule 10/12/2020   Occlusion and stenosis of unspecified carotid artery 10/12/2020   Other fluid overload 10/12/2020   Pain, unspecified 10/12/2020   Shortness of breath 10/12/2020   Vascular dementia without behavioral disturbance ([place]) 10/12/2020   Malnutrition of moderate degree 09/13/2020   Anemia of chronic disease 09/10/2020   Sleeping difficulty  07/08/2020   Fatigue 06/09/2020   Mixed Alzheimer's and vascular dementia ([place]) 06/09/2020   Coccydynia 01/26/2020   Leukopenia 12/08/2019   Muscle strain 12/08/2019   Pedal edema 11/26/2019   Dysarthria and anarthria 11/07/2019   Atrial fibrillation ([place]) 10/21/2019   Nocturia 10/21/2019   Seborrheic keratosis 10/21/2019   Carotid stenosis 08/27/2019   Thyroid nodule 08/27/2019   Status post placement of implantable loop recorder 08/07/2019   Anemia in ESRD (end-stage renal  disease) ([place]) 04/18/2019   Aphasia 04/12/2019   Benign hypertensive renal disease 04/07/2019   Hyperparathyroidism due to renal insufficiency ([place]) 04/07/2019   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, or unspecified 04/07/2019   Proteinuria 04/07/2019   History of stroke with current residual effects 03/15/2019   Hyperlipidemia associated with type 2 diabetes mellitus ([place]) 07/25/2018   Itching 10/23/2017   Nevus 04/17/2017   CKD (chronic kidney disease), stage V ([place]) 02/02/2015   Vitamin D deficiency 02/02/2015   Obesity (BMI 30-39.9) 08/03/2014   Type II diabetes mellitus with renal manifestations ([place]) 01/29/2014   Essential hypertension 01/13/2014   HLD (hyperlipidemia) 01/13/2014    Past Surgical History:  Procedure Laterality Date   ABDOMINAL HYSTERECTOMY  07/04/1983   AV FISTULA PLACEMENT Left 12/02/2020   Procedure: INSERTION OF ARTERIOVENOUS (AV)  FISTULA  ARM ( BRACHIO- CEPHALIC);  Surgeon: Algernon Huxley, MD;  Location: [facility] ORS;  Service: Vascular;  Laterality: Left;   AV FISTULA PLACEMENT Left 02/24/2021   Procedure: INSERTION OF ARTERIOVENOUS (AV) GORE-TEX GRAFT ARM;  Surgeon: Algernon Huxley, MD;  Location: [facility] ORS;  Service: Vascular;  Laterality: Left;  Left brachial Ax graft   CATARACT EXTRACTION, BILATERAL Bilateral    DIALYSIS/PERMA CATHETER INSERTION N/A 10/06/2020   Procedure: DIALYSIS/PERMA CATHETER INSERTION;  Surgeon: Algernon Huxley, MD;  Location: [facility];  Service: Cardiovascular;  Laterality: N/A;   LIGATION OF ARTERIOVENOUS  FISTULA Left 02/26/2021   Procedure: LIGATION OF ARTERIOVENOUS  FISTULA;  Surgeon: Bertram Savin, MD;  Location: [facility] ORS;  Service: Vascular;  Laterality: Left;   LOOP RECORDER INSERTION N/A 07/30/2019   Procedure: LOOP RECORDER INSERTION;  Surgeon: Isaias Cowman, MD;  Location: [facility];  Service: Cardiovascular;  Laterality: N/A;   TEE WITHOUT  CARDIOVERSION N/A 04/14/2019   Procedure: TRANSESOPHAGEAL ECHOCARDIOGRAM (TEE);  Surgeon: Teodoro Spray, MD;  Location: [facility] ORS;  Service: Cardiovascular;  Laterality: N/A;    Prior to Admission medications   Medication Sig Start Date End Date Taking? Authorizing Provider  acetaminophen (TYLENOL) 325 MG tablet Take 2 tablets (650 mg total) by mouth every 6 (six) hours as needed for mild pain, fever or moderate pain. Patient taking differently: Take 1,000 mg by mouth every 8 (eight) hours as needed for mild pain, fever or moderate pain. 03/03/21   Florencia Reasons, MD  amLODipine (NORVASC) 5 MG tablet Take 1 tablet (5 mg total) by mouth daily. Hold if SBP less than 130 mmHg 03/18/21   Val Riles, MD  ascorbic acid (VITAMIN C) 500 MG tablet Take 1 tablet (500 mg total) by mouth daily. 03/04/21   Florencia Reasons, MD  atorvastatin (LIPITOR) 80 MG tablet Take 1 tablet (80 mg total) by mouth daily. 10/14/20   [name], MD  carvedilol (COREG) 6.25 MG tablet TAKE 1 TABLET (6.25 MG TOTAL) BY MOUTH 2 (TWO) TIMES DAILY WITH A MEAL. 10/07/20   [name], MD  doxycycline (VIBRAMYCIN) 100 MG capsule Take 1  capsule (100 mg total) by mouth 2 (two) times daily. 03/11/21   Kris Hartmann, NP  ELIQUIS 2.5 MG TABS tablet Take 1 tablet (2.5 mg total) by mouth 2 (two) times daily. 10/07/20   [name], MD  ezetimibe (ZETIA) 10 MG tablet TAKE 1 TABLET BY MOUTH EVERY DAY 12/06/20   [name], MD  Ferrous Sulfate (IRON) 28 MG TABS Take 1 tablet by mouth daily in the afternoon.    [provider]  gabapentin (NEURONTIN) 100 MG capsule Take 1 capsule (100 mg total) by mouth 3 (three) times a week. Take after dialysis. 03/16/21   [name], MD  glucose blood (ACCU-CHEK GUIDE) test strip USE TO CHECK BLOOD SUGARS TWICE DAILY. E11.9 12/27/20   [name], MD  hydrALAZINE (APRESOLINE) 50 MG tablet Take 1 tablet (50 mg total) by mouth in the morning and at bedtime. Hold if SBP  less than 140  mmHg 03/18/21   Val Riles, MD  Lancets [facility] ULTRASOFT) lancets Use as instructed. Use patient insurance preferred brand. 12/27/20   [name], MD  linagliptin (TRADJENTA) 5 MG TABS tablet Take 1 tablet (5 mg total) by mouth daily. 12/23/20   [name], MD  liver oil-zinc oxide (DESITIN) 40 % ointment Apply topically as needed for irritation. 03/03/21   Florencia Reasons, MD  mirtazapine (REMERON) 7.5 MG tablet Take 7.5 mg by mouth at bedtime. 01/10/21   [provider]  multivitamin (RENA-VIT) TABS tablet Take 1 tablet by mouth at bedtime. 03/03/21   Florencia Reasons, MD  Nutritional Supplements (FEEDING SUPPLEMENT, NEPRO CARB STEADY,) LIQD Take 237 mLs by mouth 2 (two) times daily between meals. 03/04/21   Florencia Reasons, MD  senna-docusate (SENOKOT-S) 8.6-50 MG tablet Take 1 tablet by mouth at bedtime as needed for mild constipation. 03/03/21   Florencia Reasons, MD  Vitamin D, Ergocalciferol, (DRISDOL) 50000 units CAPS capsule Take 50,000 Units by mouth every 30 (thirty) days. 10/05/17   [provider]    Allergies Nsaids  Family History  Problem Relation Age of Onset   Stroke Mother    Diabetes Mother    Heart disease Father    Kidney disease Sister    Diabetes Sister    Kidney disease Brother    Heart disease Sister    Diabetes Sister    Diabetes Sister    Diabetes Sister    Kidney disease Sister    Heart disease Sister    Kidney disease Brother        kidney transplant   Early death Brother 10       Truck Accident - died   Heart disease Brother     Social History Social History   Tobacco Use   Smoking status: Never   Smokeless tobacco: Never  Vaping Use   Vaping Use: Never used  Substance Use Topics   Alcohol use: No   Drug use: No      Review of Systems Constitutional: No fever/chills Eyes: No visual changes. ENT: No sore throat. Cardiovascular: Denies chest pain. Respiratory: Denies shortness of breath. Gastrointestinal: No abdominal pain.  No nausea,  no vomiting.  No diarrhea.  No constipation. Genitourinary: Negative for dysuria. Musculoskeletal: Negative for back pain.  Left arm pain Skin: Negative for rash. Neurological: Negative for headaches, focal weakness or numbness. All other ROS negative ____________________________________________   PHYSICAL EXAM:  VITAL SIGNS: ED Triage Vitals [03/28/21 1533]  Enc Vitals Group     BP Marland Kitchen)  121/56     Pulse Rate 69     Resp 18     Temp 98.7 F (37.1 C)     Temp Source Oral     SpO2 98 %     Weight 141 lb (64 kg)     Height 5\' 5"  (1.651 m)     Head Circumference      Peak Flow      Pain Score 8     Pain Loc      Pain Edu?      Excl. in [place]?     Constitutional: Alert and oriented. Well appearing and in no acute distress. Eyes: Conjunctivae are normal. EOMI. Head: Atraumatic. Nose: No congestion/rhinnorhea. Mouth/Throat: Mucous membranes are moist.   Neck: No stridor. Trachea Midline. FROM Cardiovascular: Normal rate, regular rhythm. Grossly normal heart sounds.  Good peripheral circulation. Respiratory: Normal respiratory effort.  No retractions. Lungs CTAB. Gastrointestinal: Soft and nontender. No distention. No abdominal bruits.  Musculoskeletal: Old fistula noted in the left arm without any redness or erythema noted.  Continued left wrist drop.  Some swelling noted similar to prior per family.  Good distal radial pulse.  Neurologic:  Normal speech and language. No gross focal neurologic deficits are appreciated.  Skin:  Skin is warm, dry and intact. No rash noted. Psychiatric: Mood and affect are normal. Speech and behavior are normal. GU: Deferred   ____________________________________________   LABS (all labs ordered are listed, but only abnormal results are displayed)  Labs Reviewed  CBC WITH DIFFERENTIAL/PLATELET - Abnormal; Notable for the following components:      Result Value   WBC 3.8 (*)    RBC 2.91 (*)    Hemoglobin 9.6 (*)    HCT 27.3 (*)    RDW 15.9  (*)    All other components within normal limits  COMPREHENSIVE METABOLIC PANEL - Abnormal; Notable for the following components:   Potassium 2.8 (*)    Chloride 97 (*)    Glucose, Bld 117 (*)    Creatinine, Ser 2.26 (*)    Calcium 8.4 (*)    GFR, Estimated 21 (*)    All other components within normal limits   ____________________________________________   INITIAL IMPRESSION / ASSESSMENT AND PLAN / ED COURSE  Alizah Sills was evaluated in Emergency Department on 03/28/2021 for the symptoms described in the history of present illness. She was evaluated in the context of the global COVID-19 pandemic, which necessitated consideration that the patient might be at risk for infection with the SARS-CoV-2 virus that causes COVID-19. Institutional protocols and algorithms that pertain to the evaluation of patients at risk for COVID-19 are in a state of rapid change based on information released by regulatory bodies including the CDC and federal and state organizations. These policies and algorithms were followed during the patient's care in the ED.       Patient comes in with left arm pain.  This is been going on since 8/26 over over a month.  Continues to have left wrist drop.  Patient had thorough work-up with CTA and an ultrasound with an otherwise reassuring other concerning for potential steal syndrome.  However she has been distal pulse I discussed the case with Dr. Delana Meyer and there is no other indications for emergent imaging tonight given she has a pulse.  She is going to follow-up with Dr. [name] tomorrow in clinic.  Consider the possibility of something going on with her cervical spine given the wrist drop but she has  no cervical spine tenderness and this is been going on for over a month so I discussed that she can follow-up with her PCP to do MRI.  Patient expressed understanding and felt comfortable with plan.  Patient was given some oxycodone and Tylenol with resolution of symptoms.  Will  prescribe a few oxycodone but we discussed the risk of that given her age and she understands that.  I discussed the provisional nature of ED diagnosis, the treatment so far, the ongoing plan of care, follow up appointments and return precautions with the patient and any family or support people present. They expressed understanding and agreed with the plan, discharged home.         ____________________________________________   FINAL CLINICAL IMPRESSION(S) / ED DIAGNOSES   Final diagnoses:  Left arm pain      MEDICATIONS GIVEN DURING THIS VISIT:  Medications  acetaminophen (TYLENOL) tablet 1,000 mg (1,000 mg Oral Given 03/28/21 1928)  oxyCODONE (Oxy IR/ROXICODONE) immediate release tablet 2.5 mg (2.5 mg Oral Given 03/28/21 1928)     ED Discharge Orders          Ordered    oxyCODONE (ROXICODONE) 5 MG immediate release tablet  Every 8 hours PRN        03/28/21 2035             Note:  This document was prepared using Dragon voice recognition software and may include unintentional dictation errors.    Vanessa Davison, MD 03/28/21 2037

## 2021-03-29 ENCOUNTER — Ambulatory Visit (INDEPENDENT_AMBULATORY_CARE_PROVIDER_SITE_OTHER): Payer: Medicare Other

## 2021-03-29 ENCOUNTER — Other Ambulatory Visit (INDEPENDENT_AMBULATORY_CARE_PROVIDER_SITE_OTHER): Payer: Self-pay | Admitting: Vascular Surgery

## 2021-03-29 ENCOUNTER — Ambulatory Visit (INDEPENDENT_AMBULATORY_CARE_PROVIDER_SITE_OTHER): Payer: Medicare Other | Admitting: Vascular Surgery

## 2021-03-29 ENCOUNTER — Other Ambulatory Visit: Payer: Self-pay

## 2021-03-29 VITALS — BP 121/58 | HR 66 | Ht 65.0 in | Wt 141.0 lb

## 2021-03-29 DIAGNOSIS — M79602 Pain in left arm: Secondary | ICD-10-CM

## 2021-03-29 DIAGNOSIS — G309 Alzheimer's disease, unspecified: Secondary | ICD-10-CM | POA: Diagnosis not present

## 2021-03-29 DIAGNOSIS — E1122 Type 2 diabetes mellitus with diabetic chronic kidney disease: Secondary | ICD-10-CM | POA: Diagnosis not present

## 2021-03-29 DIAGNOSIS — R609 Edema, unspecified: Secondary | ICD-10-CM

## 2021-03-29 DIAGNOSIS — F0280 Dementia in other diseases classified elsewhere without behavioral disturbance: Secondary | ICD-10-CM | POA: Diagnosis not present

## 2021-03-29 DIAGNOSIS — M7989 Other specified soft tissue disorders: Secondary | ICD-10-CM

## 2021-03-29 DIAGNOSIS — N186 End stage renal disease: Secondary | ICD-10-CM

## 2021-03-29 DIAGNOSIS — I120 Hypertensive chronic kidney disease with stage 5 chronic kidney disease or end stage renal disease: Secondary | ICD-10-CM | POA: Diagnosis not present

## 2021-03-29 DIAGNOSIS — M79642 Pain in left hand: Secondary | ICD-10-CM | POA: Diagnosis not present

## 2021-03-29 DIAGNOSIS — Z992 Dependence on renal dialysis: Secondary | ICD-10-CM

## 2021-03-29 DIAGNOSIS — I6930 Unspecified sequelae of cerebral infarction: Secondary | ICD-10-CM

## 2021-03-29 DIAGNOSIS — D631 Anemia in chronic kidney disease: Secondary | ICD-10-CM | POA: Diagnosis not present

## 2021-03-29 NOTE — Assessment & Plan Note (Signed)
Remains catheter dependent with a right jugular PermCath.  Given the difficulties and graft placement of the left arm, I would not recommend any further access be performed in either upper extremity.  I would continue her as catheter dependent going forward.

## 2021-03-29 NOTE — Assessment & Plan Note (Signed)
The patient has persistent left arm pain even though her arm is paretic at this point.  I gave her some Neurontin for potential nerve pain.  She has had narcotics in the past.

## 2021-03-29 NOTE — Assessment & Plan Note (Signed)
blood glucose control important in reducing the progression of atherosclerotic disease. Also, involved in wound healing. On appropriate medications.  

## 2021-03-29 NOTE — Assessment & Plan Note (Signed)
The patient has significant left arm swelling although it is significantly improved from what it was in the hospital after her graft ligation shortly after graft placement.  The blisters have healed.  She is not elevating her arm.  She is not moving her arm.  She needs to try to elevate and move the arm is much as possible.  Occupational therapy has been prescribed but I do not think she has actually had any therapy yet.

## 2021-03-29 NOTE — Progress Notes (Signed)
[name], female   DOB: 1933-09-25, 87 y.o.   MRN: [id number]  Chief Complaint  Patient presents with   Follow-up    2wk FU UE venous Duplex    HPI Brianna Dodson is a 87 y.o. female.  Patient returns in follow-up.  She went to the ER yesterday but was unable to get an ultrasound due to their backlog.  She was still having a lot of pain in her left arm.  Her left arm continues to swell although the swelling is better than it was immediately postop.  She had her graft ligated on postoperative day #2 for concern for steal syndrome after her block wore off and her left hand motor function was markedly diminished.  She was also found to have a stroke on postoperative day #1 on the right side that may explain some of her symptoms beyond ischemia.  After ligating her graft, her arm did swell more but her hand function did not really return.   Past Medical History:  Diagnosis Date   A-fib ([place])    Alzheimer disease ([place])    Anemia    Carotid stenosis    Cerebral infarction ([place]) 04/12/2019   Multiple new foci of acute infarction affect the RIGHT hemisphere affecting the frontal, posterior frontal, posterior temporal, anterior parietal cortex and regional white matter.   Cerebral infarction due to embolism of right middle cerebral artery ([place]) 08/20/2019   RIGHT MCA distal M1   Chronic anticoagulation    Apixaban   CKD (chronic kidney disease), stage 4(HCC)    Embolic stroke involving cerebral artery ([place]) 03/15/2019   RIGHT MCA/PCA territory   Encephalomalacia    RIGHT posterior MCA territory   HCV (hepatitis C virus)    History of blood transfusion    Hyperlipidemia    Hypertension    OSA (obstructive sleep apnea)    Status post placement of implantable loop recorder 07/30/2019   T2DM (type 2 diabetes mellitus) [facility])     Past Surgical History:  Procedure Laterality Date   ABDOMINAL HYSTERECTOMY  07/04/1983   AV FISTULA PLACEMENT Left 12/02/2020   Procedure:  INSERTION OF ARTERIOVENOUS (AV)  FISTULA  ARM ( BRACHIO- CEPHALIC);  Surgeon: Algernon Huxley, MD;  Location: [facility] ORS;  Service: Vascular;  Laterality: Left;   AV FISTULA PLACEMENT Left 02/24/2021   Procedure: INSERTION OF ARTERIOVENOUS (AV) GORE-TEX GRAFT ARM;  Surgeon: Algernon Huxley, MD;  Location: [facility] ORS;  Service: Vascular;  Laterality: Left;  Left brachial Ax graft   CATARACT EXTRACTION, BILATERAL Bilateral    DIALYSIS/PERMA CATHETER INSERTION N/A 10/06/2020   Procedure: DIALYSIS/PERMA CATHETER INSERTION;  Surgeon: Algernon Huxley, MD;  Location: [place] [facility];  Service: Cardiovascular;  Laterality: N/A;   LIGATION OF ARTERIOVENOUS  FISTULA Left 02/26/2021   Procedure: LIGATION OF ARTERIOVENOUS  FISTULA;  Surgeon: Bertram Savin, MD;  Location: [facility];  Service: Vascular;  Laterality: Left;   LOOP RECORDER INSERTION N/A 07/30/2019   Procedure: LOOP RECORDER INSERTION;  Surgeon: Isaias Cowman, MD;  Location: [facility];  Service: Cardiovascular;  Laterality: N/A;   TEE WITHOUT CARDIOVERSION N/A 04/14/2019   Procedure: TRANSESOPHAGEAL ECHOCARDIOGRAM (TEE);  Surgeon: Teodoro Spray, MD;  Location: [facility] ORS;  Service: Cardiovascular;  Laterality: N/A;      Allergies  Allergen Reactions   Nsaids     CKD stage III - Avoid all nephrotoxic drugs    Current Outpatient Medications  Medication Sig Dispense Refill   acetaminophen (  TYLENOL) 325 MG tablet Take 2 tablets (650 mg total) by mouth every 6 (six) hours as needed for mild pain, fever or moderate pain. (Patient taking differently: Take 1,000 mg by mouth every 8 (eight) hours as needed for mild pain, fever or moderate pain.)     amLODipine (NORVASC) 5 MG tablet Take 1 tablet (5 mg total) by mouth daily. Hold if SBP less than 130 mmHg     ascorbic acid (VITAMIN C) 500 MG tablet Take 1 tablet (500 mg total) by mouth daily.     atorvastatin (LIPITOR) 80 MG tablet Take 1 tablet (80 mg total) by mouth daily. 90 tablet 3    carvedilol (COREG) 6.25 MG tablet TAKE 1 TABLET (6.25 MG TOTAL) BY MOUTH 2 (TWO) TIMES DAILY WITH A MEAL. 180 tablet 1   doxycycline (VIBRAMYCIN) 100 MG capsule Take 1 capsule (100 mg total) by mouth 2 (two) times daily. 28 capsule 0   ELIQUIS 2.5 MG TABS tablet Take 1 tablet (2.5 mg total) by mouth 2 (two) times daily. 56 tablet 0   ezetimibe (ZETIA) 10 MG tablet TAKE 1 TABLET BY MOUTH EVERY DAY 90 tablet 3   Ferrous Sulfate (IRON) 28 MG TABS Take 1 tablet by mouth daily in the afternoon.     gabapentin (NEURONTIN) 100 MG capsule Take 1 capsule (100 mg total) by mouth 3 (three) times a week. Take after dialysis. 36 capsule 1   glucose blood (ACCU-CHEK GUIDE) test strip USE TO CHECK BLOOD SUGARS TWICE DAILY. E11.9 300 each 3   hydrALAZINE (APRESOLINE) 50 MG tablet Take 1 tablet (50 mg total) by mouth in the morning and at bedtime. Hold if SBP  less than 140 mmHg     Lancets (ONETOUCH ULTRASOFT) lancets Use as instructed. Use patient insurance preferred brand. 300 each 3   linagliptin (TRADJENTA) 5 MG TABS tablet Take 1 tablet (5 mg total) by mouth daily. 90 tablet 3   liver oil-zinc oxide (DESITIN) 40 % ointment Apply topically as needed for irritation. 56.7 g 0   mirtazapine (REMERON) 7.5 MG tablet Take 7.5 mg by mouth at bedtime.     multivitamin (RENA-VIT) TABS tablet Take 1 tablet by mouth at bedtime.  0   Nutritional Supplements (FEEDING SUPPLEMENT, NEPRO CARB STEADY,) LIQD Take 237 mLs by mouth 2 (two) times daily between meals.  0   oxyCODONE (ROXICODONE) 5 MG immediate release tablet Take 0.5 tablets (2.5 mg total) by mouth every 8 (eight) hours as needed for up to 3 days. 5 tablet 0   senna-docusate (SENOKOT-S) 8.6-50 MG tablet Take 1 tablet by mouth at bedtime as needed for mild constipation.     Vitamin D, Ergocalciferol, (DRISDOL) 50000 units CAPS capsule Take 50,000 Units by mouth every 30 (thirty) days.  1   No current facility-administered medications for this visit.         Physical Exam BP (!) 121/58   Pulse 66   Ht 5\' 5"  (1.651 m)   Wt 141 lb (64 kg)   BMI 23.46 kg/m  Gen:  WD/WN, NAD Skin: incision C/D/I.  Left forearm with healed blisters from significant swelling previously.  The swelling is still present, but much better than it was in the hospital.  Her left hand is still sensate, but she essentially has no motor function in the hand.     Assessment/Plan:  End stage renal disease on dialysis [facility]) Remains catheter dependent with a right jugular PermCath.  Given the difficulties and graft placement of  the left arm, I would not recommend any further access be performed in either upper extremity.  I would continue her as catheter dependent going forward.  Type II diabetes mellitus with renal manifestations (HCC) blood glucose control important in reducing the progression of atherosclerotic disease. Also, involved in wound healing. On appropriate medications.   Left arm pain The patient has persistent left arm pain even though her arm is paretic at this point.  I gave her some Neurontin for potential nerve pain.  She has had narcotics in the past.  History of stroke with current residual effects Hard to discern how much of her arm symptoms are related to this most recent stroke versus ischemia and steal syndrome from graft placement.  Her symptoms did not improve with prompt reversal of her malperfusion with ligation of the graft.  Edema The patient has significant left arm swelling although it is significantly improved from what it was in the hospital after her graft ligation shortly after graft placement.  The blisters have healed.  She is not elevating her arm.  She is not moving her arm.  She needs to try to elevate and move the arm is much as possible.  Occupational therapy has been prescribed but I do not think she has actually had any therapy yet.      Leotis Pain 03/29/2021, 12:13 PM   This note was created with Dragon  medical transcription system.  Any errors from dictation are unintentional.

## 2021-03-29 NOTE — Assessment & Plan Note (Signed)
Hard to discern how much of her arm symptoms are related to this most recent stroke versus ischemia and steal syndrome from graft placement.  Her symptoms did not improve with prompt reversal of her malperfusion with ligation of the graft.

## 2021-03-30 DIAGNOSIS — D631 Anemia in chronic kidney disease: Secondary | ICD-10-CM | POA: Diagnosis not present

## 2021-03-30 DIAGNOSIS — N2581 Secondary hyperparathyroidism of renal origin: Secondary | ICD-10-CM | POA: Diagnosis not present

## 2021-03-30 DIAGNOSIS — D509 Iron deficiency anemia, unspecified: Secondary | ICD-10-CM | POA: Diagnosis not present

## 2021-03-30 DIAGNOSIS — Z992 Dependence on renal dialysis: Secondary | ICD-10-CM | POA: Diagnosis not present

## 2021-03-30 DIAGNOSIS — Z23 Encounter for immunization: Secondary | ICD-10-CM | POA: Diagnosis not present

## 2021-03-30 DIAGNOSIS — N186 End stage renal disease: Secondary | ICD-10-CM | POA: Diagnosis not present

## 2021-03-31 ENCOUNTER — Other Ambulatory Visit: Payer: Self-pay

## 2021-03-31 ENCOUNTER — Encounter: Payer: Medicare Other | Attending: Physician Assistant | Admitting: Physician Assistant

## 2021-03-31 DIAGNOSIS — F0280 Dementia in other diseases classified elsewhere without behavioral disturbance: Secondary | ICD-10-CM | POA: Diagnosis not present

## 2021-03-31 DIAGNOSIS — Z992 Dependence on renal dialysis: Secondary | ICD-10-CM | POA: Insufficient documentation

## 2021-03-31 DIAGNOSIS — I120 Hypertensive chronic kidney disease with stage 5 chronic kidney disease or end stage renal disease: Secondary | ICD-10-CM | POA: Insufficient documentation

## 2021-03-31 DIAGNOSIS — Z7901 Long term (current) use of anticoagulants: Secondary | ICD-10-CM | POA: Diagnosis not present

## 2021-03-31 DIAGNOSIS — N186 End stage renal disease: Secondary | ICD-10-CM | POA: Diagnosis not present

## 2021-03-31 DIAGNOSIS — D631 Anemia in chronic kidney disease: Secondary | ICD-10-CM | POA: Diagnosis not present

## 2021-03-31 DIAGNOSIS — L89323 Pressure ulcer of left buttock, stage 3: Secondary | ICD-10-CM | POA: Insufficient documentation

## 2021-03-31 DIAGNOSIS — Z8673 Personal history of transient ischemic attack (TIA), and cerebral infarction without residual deficits: Secondary | ICD-10-CM | POA: Diagnosis not present

## 2021-03-31 DIAGNOSIS — E1122 Type 2 diabetes mellitus with diabetic chronic kidney disease: Secondary | ICD-10-CM | POA: Diagnosis not present

## 2021-03-31 DIAGNOSIS — R2689 Other abnormalities of gait and mobility: Secondary | ICD-10-CM | POA: Diagnosis not present

## 2021-03-31 DIAGNOSIS — G309 Alzheimer's disease, unspecified: Secondary | ICD-10-CM | POA: Diagnosis not present

## 2021-03-31 NOTE — Progress Notes (Signed)
Brianna Dodson, Brianna Dodson ([id number]) Visit Report for 03/31/2021 Chief Complaint Document Details Patient Name: Brianna Dodson, Brianna Dodson Date of Service: 03/31/2021 12:45 PM Medical Record Number: [id number] Patient Account Number: [id number] Date of Birth/Sex: 1933-09-27 (87 y.o. Female) Treating RN: Primary Care Provider: [name] Other Clinician: Referring Provider: [name] Treating Provider/Extender: Skipper Cliche in Treatment: 0 Information Obtained from: Patient Chief Complaint Pressure ulcer left gluteus Electronic Signature(s) Signed: 03/31/2021 2:04:57 PM By: Worthy Keeler PA-C Entered By: Worthy Keeler on 03/31/2021 14:04:57 Brianna Dodson ([id number]) -------------------------------------------------------------------------------- Debridement Details Patient Name: Brianna Dodson Date of Service: 03/31/2021 12:45 PM Medical Record Number: [id number] Patient Account Number: [id number] Date of Birth/Sex: [birth year] (87 y.o. Female) Treating RN: Dolan Amen Primary Care Provider: [name] Other Clinician: Referring Provider: [name] Treating Provider/Extender: Skipper Cliche in Treatment: 0 Debridement Performed for Wound #1 Left Gluteus Assessment: Performed By: Physician Tommie Sams., PA-C Debridement Type: Chemical/Enzymatic/Mechanical Agent Used: saline/gauze Level of Consciousness (Pre- Awake and Alert procedure): Pre-procedure Verification/Time Out Yes - 14:00 Taken: Start Time: 14:00 Instrument: Other : saline gauze Bleeding: None Response to Treatment: Procedure was tolerated well Level of Consciousness (Post- Awake and Alert procedure): Post Debridement Measurements of Total Wound Length: (cm) 3 Stage: Category/Stage III Width: (cm) 0.8 Depth: (cm) 0.2 Volume: (cm) 0.377 Character of Wound/Ulcer Post Debridement: Stable Post Procedure Diagnosis Same as Pre-procedure Electronic Signature(s) Signed: 03/31/2021 2:44:44 PM By:  Dolan Amen RN Signed: 03/31/2021 5:23:54 PM By: Worthy Keeler PA-C Entered By: Dolan Amen on 03/31/2021 14:44:44 Brianna Dodson ([id number]) -------------------------------------------------------------------------------- HPI Details Patient Name: Brianna Dodson Date of Service: 03/31/2021 12:45 PM Medical Record Number: [id number] Patient Account Number: [id number] Date of Birth/Sex: 1934/02/01 (87 y.o. Female) Treating RN: Primary Care Provider: [name] Other Clinician: Referring Provider: [name] Treating Provider/Extender: Skipper Cliche in Treatment: 0 History of Present Illness HPI Description: 03/31/2021 upon evaluation today patient presents for initial inspection here in our clinic concerning issues that she has been having unfortunately with a pressure ulcer in the left gluteal region that actually occurred as a result of her having been in the hospital due to an issue with her dialysis access in the left arm. This was following surgery. She is ended up with nerve damage unfortunately. With that being said she was sent after the hospital stay to a rehab facility and tells me that "the bed was really bad". With that being said she has been using Silvadene on this they have also been using a foam to cover overall it is fairly moist that is the main issue that I see. The good news is its not very deep. It is causing her a lot of pain however. Patient does have a history of diabetes mellitus type 2, end-stage renal disease with dependence on renal dialysis, and history of stroke fortunately there is no significant residual deficits from a movement standpoint though I think her speech is somewhat affected. She is on anticoagulant therapy due to this history of stroke. Electronic Signature(s) Signed: 03/31/2021 2:11:49 PM By: Worthy Keeler PA-C Entered By: Worthy Keeler on 03/31/2021 14:11:49 Brianna Dodson  ([id number]) -------------------------------------------------------------------------------- Physical Exam Details Patient Name: Brianna Dodson Date of Service: 03/31/2021 12:45 PM Medical Record Number: [id number] Patient Account Number: [id number] Date of Birth/Sex: 06-Nov-1933 (87 y.o. Female) Treating RN: Primary Care Provider: [name] Other Clinician: Referring Provider: [name] Treating Provider/Extender: Skipper Cliche in Treatment: 0 Constitutional patient is hypertensive.. pulse regular and within target range for patient.Marland Kitchen respirations  regular, non-labored and within target range for patient.Marland Kitchen temperature within target range for patient.. Well-nourished and well-hydrated in no acute distress. Eyes conjunctiva clear no eyelid edema noted. pupils equal round and reactive to light and accommodation. Ears, Nose, Mouth, and Throat no gross abnormality of ear auricles or external auditory canals. normal hearing noted during conversation. mucus membranes moist. Respiratory normal breathing without difficulty. Musculoskeletal shuffling gait. no significant deformity or arthritic changes, no loss or range of motion, no clubbing. Psychiatric this patient is able to make decisions and demonstrates good insight into disease process. Alert and Oriented x 3. pleasant and cooperative. Notes Upon inspection patient's wound bed actually showed signs of doing fairly well there was minimal slough noted and fortunately I was able to clean this away just with saline and gauze no sharp debridement was necessary today. Following the saline and gauze debridement the wound does appear to be doing quite well I think the main issue here is that staying very moist due to the location. Electronic Signature(s) Signed: 03/31/2021 2:12:26 PM By: Worthy Keeler PA-C Entered By: Worthy Keeler on 03/31/2021 14:12:26 Brianna Dodson  ([id number]) -------------------------------------------------------------------------------- Physician Orders Details Patient Name: Brianna Dodson Date of Service: 03/31/2021 12:45 PM Medical Record Number: [id number] Patient Account Number: [id number] Date of Birth/Sex: 09/28/1933 (87 y.o. Female) Treating RN: Dolan Amen Primary Care Provider: [name] Other Clinician: Referring Provider: [name] Treating Provider/Extender: Skipper Cliche in Treatment: 0 Verbal / Phone Orders: No Diagnosis Coding ICD-10 Coding Code Description [phone] Pressure ulcer of left buttock, stage 3 E11.622 Type 2 diabetes mellitus with other skin ulcer N18.6 End stage renal disease Z99.2 Dependence on renal dialysis Z79.01 Long term (current) use of anticoagulants Follow-up Appointments o Return Appointment in 1 week. [place] for wound care. May utilize formulary equivalent dressing for wound treatment orders unless otherwise specified. Home Health Nurse may visit [facility] to address patientos wound care needs. o Scheduled days for dressing changes to be completed; exception, patient has scheduled wound care visit that day. - Sister and patient instructed to change dressing in between home health visits to reduce moisture accumulation o **Please direct any NON-WOUND related issues/requests for orders to patient's Primary Care Physician. **If current dressing causes regression in wound condition, may D/C ordered dressing product/s and apply Normal Saline Moist Dressing daily until next [place] or Other MD appointment. **Notify [facility] of regression in wound condition at [phone]. Bathing/ Shower/ Hygiene o May shower; gently cleanse wound with antibacterial soap, rinse and pat dry prior to dressing wounds o No tub bath. Off-Loading o Turn and reposition every 2 hours Wound  Treatment Wound #1 - Gluteus Wound Laterality: Left Cleanser: Wound Cleanser 3 x Per Week/30 Days Discharge Instructions: Wash your hands with soap and water. Remove old dressing, discard into plastic bag and place into trash. Cleanse the wound with Wound Cleanser prior to applying a clean dressing using gauze sponges, not tissues or cotton balls. Do not scrub or use excessive force. Pat dry using gauze sponges, not tissue or cotton balls. Primary Dressing: Hydrofera Blue Ready Transfer Foam, 2.5x2.5 (in/in) (DME) (Generic) 3 x Per Week/30 Days Discharge Instructions: Apply Hydrofera Blue Ready to wound bed as directed Primary Dressing: Zetuvit Plus Silicone Border Dressing 4x4 (in/in) (DME) (Generic) 3 x Per Week/30 Days Discharge Instructions: Secure dressing/may use mepilex border flex dressing Electronic Signature(s) Signed: 03/31/2021 2:52:30 PM By: Dolan Amen RN Signed:  03/31/2021 5:23:54 PM By: Worthy Keeler PA-C Entered By: Dolan Amen on 03/31/2021 14:52:30 Brianna Dodson ([id number]) -------------------------------------------------------------------------------- Problem List Details Patient Name: Brianna Dodson Date of Service: 03/31/2021 12:45 PM Medical Record Number: [id number] Patient Account Number: [id number] Date of Birth/Sex: 03-Mar-1934 (87 y.o. Female) Treating RN: Primary Care Provider: [name] Other Clinician: Referring Provider: [name] Treating Provider/Extender: Skipper Cliche in Treatment: 0 Active Problems ICD-10 Encounter Code Description Active Date MDM Diagnosis L89.323 Pressure ulcer of left buttock, stage 3 03/31/2021 No Yes E11.622 Type 2 diabetes mellitus with other skin ulcer 03/31/2021 No Yes N18.6 End stage renal disease 03/31/2021 No Yes Z99.2 Dependence on renal dialysis 03/31/2021 No Yes Z79.01 Long term (current) use of anticoagulants 03/31/2021 No Yes Inactive Problems Resolved Problems Electronic Signature(s) Signed:  03/31/2021 2:04:38 PM By: Worthy Keeler PA-C Entered By: Worthy Keeler on 03/31/2021 14:04:37 Brianna Dodson ([id number]) -------------------------------------------------------------------------------- Progress Note Details Patient Name: Brianna Dodson Date of Service: 03/31/2021 12:45 PM Medical Record Number: [id number] Patient Account Number: [id number] Date of Birth/Sex: [birth year] (87 y.o. Female) Treating RN: Primary Care Provider: [name] Other Clinician: Referring Provider: [name] Treating Provider/Extender: Skipper Cliche in Treatment: 0 Subjective Chief Complaint Information obtained from Patient Pressure ulcer left gluteus History of Present Illness (HPI) 03/31/2021 upon evaluation today patient presents for initial inspection here in our clinic concerning issues that she has been having unfortunately with a pressure ulcer in the left gluteal region that actually occurred as a result of her having been in the hospital due to an issue with her dialysis access in the left arm. This was following surgery. She is ended up with nerve damage unfortunately. With that being said she was sent after the hospital stay to a rehab facility and tells me that "the bed was really bad". With that being said she has been using Silvadene on this they have also been using a foam to cover overall it is fairly moist that is the main issue that I see. The good news is its not very deep. It is causing her a lot of pain however. Patient does have a history of diabetes mellitus type 2, end-stage renal disease with dependence on renal dialysis, and history of stroke fortunately there is no significant residual deficits from a movement standpoint though I think her speech is somewhat affected. She is on anticoagulant therapy due to this history of stroke. Patient History Information obtained from Patient. Allergies No Known Drug Allergies Social History Never smoker. Medical  History Eyes Patient has history of Cataracts Endocrine Patient has history of Type II Diabetes Neurologic Denies history of Seizure Disorder Patient is treated with Oral Agents. Blood sugar is tested. Review of Systems (ROS) Constitutional Symptoms (General Health) Denies complaints or symptoms of Fatigue, Fever, Chills, Marked Weight Change. Eyes Denies complaints or symptoms of Dry Eyes, Vision Changes, Glasses / Contacts. Ear/Nose/Mouth/Throat Denies complaints or symptoms of Difficult clearing ears, Sinusitis. Hematologic/Lymphatic Denies complaints or symptoms of Bleeding / Clotting Disorders, Human Immunodeficiency Virus. Respiratory Denies complaints or symptoms of Chronic or frequent coughs, Shortness of Breath. Cardiovascular Denies complaints or symptoms of Chest pain, LE edema. Gastrointestinal Denies complaints or symptoms of Frequent diarrhea, Nausea, Vomiting. Endocrine Denies complaints or symptoms of Hepatitis, Thyroid disease, Polydypsia (Excessive Thirst). Genitourinary Complains or has symptoms of Kidney failure/ Dialysis. Immunological Denies complaints or symptoms of Hives, Itching. Integumentary (Skin) Complains or has symptoms of Wounds. Denies complaints or symptoms of Bleeding or bruising tendency, Breakdown, Swelling. Musculoskeletal Denies  complaints or symptoms of Muscle Pain, Muscle Weakness. Neurologic Complains or has symptoms of Focal/Weakness - Left arm. Brianna Dodson, Brianna Dodson ([id number]) Denies complaints or symptoms of Numbness/parasthesias, Stroke hx-September 2020 Psychiatric Denies complaints or symptoms of Anxiety, Claustrophobia. Objective Constitutional patient is hypertensive.. pulse regular and within target range for patient.Marland Kitchen respirations regular, non-labored and within target range for patient.Marland Kitchen temperature within target range for patient.. Well-nourished and well-hydrated in no acute distress. Vitals Time Taken: 1:17 PM, Height: 65  in, Source: Stated, Weight: 141 lbs, Source: Stated, BMI: 23.5, Temperature: 97.7 F, Pulse: 72 bpm, Respiratory Rate: 18 breaths/min, Blood Pressure: 99/63 mmHg. Eyes conjunctiva clear no eyelid edema noted. pupils equal round and reactive to light and accommodation. Ears, Nose, Mouth, and Throat no gross abnormality of ear auricles or external auditory canals. normal hearing noted during conversation. mucus membranes moist. Respiratory normal breathing without difficulty. Musculoskeletal shuffling gait. no significant deformity or arthritic changes, no loss or range of motion, no clubbing. Psychiatric this patient is able to make decisions and demonstrates good insight into disease process. Alert and Oriented x 3. pleasant and cooperative. General Notes: Upon inspection patient's wound bed actually showed signs of doing fairly well there was minimal slough noted and fortunately I was able to clean this away just with saline and gauze no sharp debridement was necessary today. Following the saline and gauze debridement the wound does appear to be doing quite well I think the main issue here is that staying very moist due to the location. Integumentary (Hair, Skin) Wound #1 status is Open. Original cause of wound was Pressure Injury. The date acquired was: 02/28/2021. The wound is located on the Left Gluteus. The wound measures 3cm length x 0.8cm width x 0.2cm depth; 1.885cm^2 area and 0.377cm^3 volume. There is Fat Layer (Subcutaneous Tissue) exposed. There is no tunneling or undermining noted. There is a medium amount of serosanguineous drainage noted. There is medium (34-66%) pink granulation within the wound bed. There is a medium (34-66%) amount of necrotic tissue within the wound bed including Adherent Slough. Assessment Active Problems ICD-10 Pressure ulcer of left buttock, stage 3 Type 2 diabetes mellitus with other skin ulcer End stage renal disease Dependence on renal  dialysis Long term (current) use of anticoagulants Plan 1. Would recommend currently based on what I am seeing that we go ahead and initiate treatment with a Hydrofera Blue dressing I think this is good to be the ideal thing at this point. 2. I am also can recommend that we use a Zetuvit border foam dressing to cover which I think will also be beneficial for her. Brianna Dodson, Brianna Dodson ([id number]) 3. I would also suggest that the patient continue to get up and move around as much as she can. Obviously at dialysis she has to sit but at times when she does not have to sit I do think movement and repositioning will be her best friend. We will see patient back for reevaluation in 1 week here in the clinic. If anything worsens or changes patient will contact our office for additional recommendations. Electronic Signature(s) Signed: 03/31/2021 2:13:23 PM By: Worthy Keeler PA-C Entered By: Worthy Keeler on 03/31/2021 14:13:23 Brianna Dodson ([id number]) -------------------------------------------------------------------------------- ROS/PFSH Details Patient Name: Brianna Dodson Date of Service: 03/31/2021 12:45 PM Medical Record Number: [id number] Patient Account Number: [id number] Date of Birth/Sex: [birth year] (87 y.o. Female) Treating RN: Dolan Amen Primary Care Provider: [name] Other Clinician: Referring Provider: [name] Treating Provider/Extender: Skipper Cliche in Treatment: 0 Information Obtained From  Patient Constitutional Symptoms (General Health) Complaints and Symptoms: Negative for: Fatigue; Fever; Chills; Marked Weight Change Eyes Complaints and Symptoms: Negative for: Dry Eyes; Vision Changes; Glasses / Contacts Medical History: Positive for: Cataracts Ear/Nose/Mouth/Throat Complaints and Symptoms: Negative for: Difficult clearing ears; Sinusitis Hematologic/Lymphatic Complaints and Symptoms: Negative for: Bleeding / Clotting Disorders; Human  Immunodeficiency Virus Respiratory Complaints and Symptoms: Negative for: Chronic or frequent coughs; Shortness of Breath Cardiovascular Complaints and Symptoms: Negative for: Chest pain; LE edema Gastrointestinal Complaints and Symptoms: Negative for: Frequent diarrhea; Nausea; Vomiting Endocrine Complaints and Symptoms: Negative for: Hepatitis; Thyroid disease; Polydypsia (Excessive Thirst) Medical History: Positive for: Type II Diabetes Time with diabetes: 20 years Treated with: Oral agents Blood sugar tested every day: Yes Tested : Genitourinary Complaints and Symptoms: Positive for: Kidney failure/ Dialysis Immunological Brianna Dodson, Brianna Dodson ([id number]) Complaints and Symptoms: Negative for: Hives; Itching Integumentary (Skin) Complaints and Symptoms: Positive for: Wounds Negative for: Bleeding or bruising tendency; Breakdown; Swelling Musculoskeletal Complaints and Symptoms: Negative for: Muscle Pain; Muscle Weakness Neurologic Complaints and Symptoms: Positive for: Focal/Weakness - Left arm Negative for: Numbness/parasthesias Review of System Notes: Stroke hx-September 2020 Medical History: Negative for: Seizure Disorder Psychiatric Complaints and Symptoms: Negative for: Anxiety; Claustrophobia Oncologic HBO Extended History Items Eyes: Cataracts Immunizations Pneumococcal Vaccine: Received Pneumococcal Vaccination: No Implantable Devices None Family and Social History 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 Female)  Treating RN: Primary Care Provider: [name] Other Clinician: Referring Provider: [name] Treating Provider/Extender: Skipper Cliche in Treatment: 0 Diagnosis Coding ICD-10 Codes Code Description [phone] Pressure ulcer of left buttock, stage 3 E11.622 Type 2 diabetes mellitus with other skin ulcer N18.6 End stage renal disease Z99.2 Dependence on renal dialysis Z79.01 Long term (current) use of anticoagulants Facility Procedures CPT4 Code: [id number] Description: 99214 - WOUND CARE VISIT-LEV 4 EST PT Modifier: Quantity: 1 Physician Procedures CPT4 Code: 2703500 Description: WC PHYS LEVEL 3 o NEW PT Modifier: Quantity: 1 CPT4 Code: Description: ICD-10 Diagnosis Description L89.323 Pressure ulcer of left buttock, stage 3 E11.622 Type 2 diabetes mellitus with other skin ulcer N18.6 End stage renal disease Z99.2 Dependence on renal dialysis Modifier: Quantity: Electronic Signature(s) Signed: 03/31/2021 2:53:14 PM By: Dolan Amen RN Signed: 03/31/2021 5:23:54 PM By: Worthy Keeler PA-C Previous Signature: 03/31/2021 2:13:47 PM Version By: Worthy Keeler PA-C Entered By: Dolan Amen on 03/31/2021 14:53:14

## 2021-03-31 NOTE — Progress Notes (Signed)
Brianna Dodson, Brianna Dodson ([id number]) Visit Report for 03/31/2021 Allergy List Details Patient Name: Brianna Dodson, Brianna Dodson Date of Service: 03/31/2021 12:45 PM Medical Record Number: [id number] Patient Account Number: [id number] Date of Birth/Sex: December 20, 1933 (87 y.o. Female) Treating RN: Dolan Amen Primary Care Shatima Zalar: [name] Other Clinician: Referring Cay Kath: [name] Treating Kaileena Obi/Extender: Jeri Cos Weeks in Treatment: 0 Allergies Active Allergies No Known Drug Allergies Allergy Notes Electronic Signature(s) Signed: 03/31/2021 4:16:02 PM By: Dolan Amen RN Entered By: Dolan Amen on 03/31/2021 13:18:45 Brianna Dodson ([id number]) -------------------------------------------------------------------------------- Arrival Information Details Patient Name: Brianna Dodson Date of Service: 03/31/2021 12:45 PM Medical Record Number: [id number] Patient Account Number: [id number] Date of Birth/Sex: [birth year] (87 y.o. Female) Treating RN: Dolan Amen Primary Care Noni Stonesifer: [name] Other Clinician: Referring Jaimarie Rapozo: [name] Treating Harlee Pursifull/Extender: Skipper Cliche in Treatment: 0 Visit Information Patient Arrived: Ambulatory Arrival Time: 13:16 Accompanied By: sister Transfer Assistance: None Patient Identification Verified: Yes Secondary Verification Process Completed: Yes Electronic Signature(s) Signed: 03/31/2021 4:16:02 PM By: Dolan Amen RN Entered By: Dolan Amen on 03/31/2021 13:17:02 Brianna Dodson ([id number]) -------------------------------------------------------------------------------- Clinic Level of Care Assessment Details Patient Name: Brianna Dodson Date of Service: 03/31/2021 12:45 PM Medical Record Number: [id number] Patient Account Number: [id number] Date of Birth/Sex: Dec 18, 1933 (87 y.o. Female) Treating RN: Dolan Amen Primary Care Bernadetta Roell: [name] Other Clinician: Referring Liev Brockbank: [name] Treating Tenesha Garza/Extender: Skipper Cliche in Treatment: 0 Clinic Level of Care Assessment Items TOOL 2 Quantity Score X - Use when only an EandM is performed on the INITIAL visit 1 0 ASSESSMENTS - Nursing Assessment / Reassessment X - General Physical Exam (combine w/ comprehensive assessment (listed just below) when performed on new 1 20 pt. evals) X- 1 25 Comprehensive Assessment (HX, ROS, Risk Assessments, Wounds Hx, etc.) ASSESSMENTS - Wound and Skin Assessment / Reassessment X - Simple Wound Assessment / Reassessment - one wound 1 5 []  - 0 Complex Wound Assessment / Reassessment - multiple wounds []  - 0 Dermatologic / Skin Assessment (not related to wound area) ASSESSMENTS - Ostomy and/or Continence Assessment and Care []  - Incontinence Assessment and Management 0 []  - 0 Ostomy Care Assessment and Management (repouching, etc.) PROCESS - Coordination of Care X - Simple Patient / Family Education for ongoing care 1 15 []  - 0 Complex (extensive) Patient / Family Education for ongoing care X- 1 10 Staff obtains Programmer, systems, Records, Test Results / Process Orders []  - 0 Staff telephones HHA, Nursing Homes / Clarify orders / etc []  - 0 Routine Transfer to another Facility (non-emergent condition) []  - 0 Routine Hospital Admission (non-emergent condition) X- 1 15 New Admissions / Biomedical engineer / Ordering NPWT, Apligraf, etc. []  - 0 Emergency Hospital Admission (emergent condition) X- 1 10 Simple Discharge Coordination []  - 0 Complex (extensive) Discharge Coordination PROCESS - Special Needs []  - Pediatric / Minor Patient Management 0 []  - 0 Isolation Patient Management []  - 0 Hearing / Language / Visual special needs []  - 0 Assessment of Community assistance (transportation, D/C planning, etc.) []  - 0 Additional assistance / Altered mentation []  - 0 Support Surface(s) Assessment (bed, cushion, seat, etc.) INTERVENTIONS - Wound Cleansing /  Measurement X - Wound Imaging (photographs - any number of wounds) 1 5 []  - 0 Wound Tracing (instead of photographs) X- 1 5 Simple Wound Measurement - one wound []  - 0 Complex Wound Measurement - multiple wounds Brianna Dodson, Brianna Dodson ([id number]) X- 1 5 Simple Wound Cleansing - one wound []  - 0 Complex Wound Cleansing -  multiple wounds INTERVENTIONS - Wound Dressings []  - Small Wound Dressing one or multiple wounds 0 X- 1 15 Medium Wound Dressing one or multiple wounds []  - 0 Large Wound Dressing one or multiple wounds []  - 0 Application of Medications - injection INTERVENTIONS - Miscellaneous []  - External ear exam 0 []  - 0 Specimen Collection (cultures, biopsies, blood, body fluids, etc.) []  - 0 Specimen(s) / Culture(s) sent or taken to Lab for analysis []  - 0 Patient Transfer (multiple staff / Harrel Lemon Lift / Similar devices) []  - 0 Simple Staple / Suture removal (25 or less) []  - 0 Complex Staple / Suture removal (26 or more) []  - 0 Hypo / Hyperglycemic Management (close monitor of Blood Glucose) []  - 0 Ankle / Brachial Index (ABI) - do not check if billed separately Has the patient been seen at the hospital within the last three years: Yes Total Score: 130 Level Of Care: New/Established - Level 4 Electronic Signature(s) Signed: 03/31/2021 4:16:02 PM By: Dolan Amen RN Entered By: Dolan Amen on 03/31/2021 14:53:03 Brianna Dodson ([id number]) -------------------------------------------------------------------------------- Encounter Discharge Information Details Patient Name: Brianna Dodson Date of Service: 03/31/2021 12:45 PM Medical Record Number: [id number] Patient Account Number: [id number] Date of Birth/Sex: 1934/04/06 (87 y.o. Female) Treating RN: Dolan Amen Primary Care Charlee Whitebread: [name] Other Clinician: Referring Amory Simonetti: [name] Treating Jesselyn Rask/Extender: Skipper Cliche in Treatment: 0 Encounter Discharge Information Items Post  Procedure Vitals Discharge Condition: Stable Temperature (F): 97.7 Ambulatory Status: Ambulatory Pulse (bpm): 72 Discharge Destination: Home Respiratory Rate (breaths/min): 18 Transportation: Private Auto Blood Pressure (mmHg): 99/63 Accompanied By: sister Schedule Follow-up Appointment: Yes Clinical Summary of Care: Electronic Signature(s) Signed: 03/31/2021 2:54:17 PM By: Dolan Amen RN Entered By: Dolan Amen on 03/31/2021 14:54:17 Brianna Dodson ([id number]) -------------------------------------------------------------------------------- Lower Extremity Assessment Details Patient Name: Brianna Dodson Date of Service: 03/31/2021 12:45 PM Medical Record Number: [id number] Patient Account Number: [id number] Date of Birth/Sex: 08-01-1933 (87 y.o. Female) Treating RN: Dolan Amen Primary Care Jonothan Heberle: [name] Other Clinician: Referring Anatole Apollo: [name] Treating Adalyna Godbee/Extender: Jeri Cos Weeks in Treatment: 0 Electronic Signature(s) Signed: 03/31/2021 4:16:02 PM By: Dolan Amen RN Entered By: Dolan Amen on 03/31/2021 14:06:22 Brianna Dodson ([id number]) -------------------------------------------------------------------------------- Multi Wound Chart Details Patient Name: Brianna Dodson Date of Service: 03/31/2021 12:45 PM Medical Record Number: [id number] Patient Account Number: [id number] Date of Birth/Sex: 07-21-1933 (87 y.o. Female) Treating RN: Dolan Amen Primary Care Quinten Allerton: [name] Other Clinician: Referring Shawneequa Baldridge: [name] Treating Dorotha Hirschi/Extender: Skipper Cliche in Treatment: 0 Vital Signs Height(in): 65 Pulse(bpm): 72 Weight(lbs): 141 Blood Pressure(mmHg): 99/63 Body Mass Index(BMI): 23 Temperature(F): 97.7 Respiratory Rate(breaths/min): 18 Photos: [N/A:N/A] Wound Location: Coccyx N/A N/A Wounding Event: Pressure Injury N/A N/A Primary Etiology: Pressure Ulcer N/A N/A Comorbid History:  Cataracts, Type II Diabetes N/A N/A Date Acquired: 02/28/2021 N/A N/A Weeks of Treatment: 0 N/A N/A Wound Status: Open N/A N/A Measurements L x W x D (cm) 3x0.8x0.2 N/A N/A Area (cm) : 1.885 N/A N/A Volume (cm) : 0.377 N/A N/A % Reduction in Area: 0.00% N/A N/A % Reduction in Volume: 0.00% N/A N/A Classification: Category/Stage III N/A N/A Exudate Amount: Medium N/A N/A Exudate Type: Serosanguineous N/A N/A Exudate Color: red, brown N/A N/A Granulation Amount: Medium (34-66%) N/A N/A Granulation Quality: Pink N/A N/A Necrotic Amount: Medium (34-66%) N/A N/A Exposed Structures: Fat Layer (Subcutaneous Tissue): N/A N/A Yes Fascia: No Tendon: No Muscle: No Joint: No Bone: No Epithelialization: Small (1-33%) N/A N/A Treatment Notes Electronic Signature(s) Signed:  03/31/2021 2:41:57 PM By: Dolan Amen RN Entered By: Dolan Amen on 03/31/2021 14:41:56 Brianna Dodson ([id number]) -------------------------------------------------------------------------------- Multi-Disciplinary Care Plan Details Patient Name: Brianna Dodson Date of Service: 03/31/2021 12:45 PM Medical Record Number: [id number] Patient Account Number: [id number] Date of Birth/Sex: [birth year] (87 y.o. Female) Treating RN: Dolan Amen Primary Care Yareliz Thorstenson: [name] Other Clinician: Referring Kerissa Coia: [name] Treating Pami Wool/Extender: Skipper Cliche in Treatment: 0 Active Inactive Orientation to the Wound Care Program Nursing Diagnoses: Knowledge deficit related to the wound healing center program Goals: Patient/caregiver will verbalize understanding of the [facility] Program Date Initiated: 03/31/2021 Target Resolution Date: 03/31/2021 Goal Status: Active Interventions: Provide education on orientation to the wound center Notes: Wound/Skin Impairment Nursing Diagnoses: Impaired tissue integrity Goals: Patient/caregiver will verbalize understanding of skin care  regimen Date Initiated: 03/31/2021 Target Resolution Date: 03/31/2021 Goal Status: Active Ulcer/skin breakdown will have a volume reduction of 30% by week 4 Date Initiated: 03/31/2021 Target Resolution Date: 04/30/2021 Goal Status: Active Ulcer/skin breakdown will have a volume reduction of 50% by week 8 Date Initiated: 03/31/2021 Target Resolution Date: 05/31/2021 Goal Status: Active Ulcer/skin breakdown will have a volume reduction of 80% by week 12 Date Initiated: 03/31/2021 Target Resolution Date: 06/30/2021 Goal Status: Active Ulcer/skin breakdown will heal within 14 weeks Date Initiated: 03/31/2021 Target Resolution Date: 07/31/2021 Goal Status: Active Interventions: Assess patient/caregiver ability to obtain necessary supplies Assess patient/caregiver ability to perform ulcer/skin care regimen upon admission and as needed Assess ulceration(s) every visit Provide education on ulcer and skin care Treatment Activities: Skin care regimen initiated : 03/31/2021 Notes: Electronic Signature(s) Signed: 03/31/2021 2:41:44 PM By: Dolan Amen RN Entered By: Dolan Amen on 03/31/2021 14:41:44 Brianna Dodson ([id number]) -------------------------------------------------------------------------------- Pain Assessment Details Patient Name: Brianna Dodson Date of Service: 03/31/2021 12:45 PM Medical Record Number: [id number] Patient Account Number: [id number] Date of Birth/Sex: Aug 10, 1933 (87 y.o. Female) Treating RN: Dolan Amen Primary Care Kambrey Hagger: [name] Other Clinician: Referring Trishelle Devora: [name] Treating Katrell Milhorn/Extender: Skipper Cliche in Treatment: 0 Active Problems Location of Pain Severity and Description of Pain Patient Has Paino Yes Site Locations Pain Location: Pain in Ulcers Rate the pain. Current Pain Level: 9 Pain Management and Medication Current Pain Management: Electronic Signature(s) Signed: 03/31/2021 4:16:02 PM By: Dolan Amen RN Entered By: Dolan Amen on 03/31/2021 13:17:11 Brianna Dodson ([id number]) -------------------------------------------------------------------------------- Patient/Caregiver Education Details Patient Name: Brianna Dodson Date of Service: 03/31/2021 12:45 PM Medical Record Number: [id number] Patient Account Number: [id number] Date of Birth/Gender: 1933-09-05 (87 y.o. Female) Treating RN: Dolan Amen Primary Care Physician: [name] Other Clinician: Referring Physician: [name] Treating Physician/Extender: Skipper Cliche in Treatment: 0 Education Assessment Education Provided To: Patient Education Topics Provided Welcome To The [facility]: Methods: Explain/Verbal Responses: State content correctly Wound/Skin Impairment: Methods: Explain/Verbal Responses: State content correctly Electronic Signature(s) Signed: 03/31/2021 4:16:02 PM By: Dolan Amen RN Entered By: Dolan Amen on 03/31/2021 14:53:24 Brianna Dodson ([id number]) -------------------------------------------------------------------------------- Wound Assessment Details Patient Name: Brianna Dodson Date of Service: 03/31/2021 12:45 PM Medical Record Number: [id number] Patient Account Number: [id number] Date of Birth/Sex: March 02, 1934 (87 y.o. Female) Treating RN: Dolan Amen Primary Care Joniece Smotherman: [name] Other Clinician: Referring Shannyn Jankowiak: [name] Treating Khalib Fendley/Extender: Jeri Cos Weeks in Treatment: 0 Wound Status Wound Number: 1 Primary Etiology: Pressure Ulcer Wound Location: Coccyx Wound Status: Open Wounding Event: Pressure Injury Comorbid History: Cataracts, Type II Diabetes Date Acquired: 02/28/2021 Weeks Of Treatment: 0 Clustered Wound: No Photos Wound Measurements Length: (cm)  3 Width: (cm) 0.8 Depth: (cm) 0.2 Area: (cm) 1.885 Volume: (cm) 0.377 % Reduction in Area: 0% % Reduction in Volume: 0% Epithelialization: Small  (1-33%) Tunneling: No Undermining: No Wound Description Classification: Category/Stage III Exudate Amount: Medium Exudate Type: Serosanguineous Exudate Color: red, brown Foul Odor After Cleansing: No Slough/Fibrino Yes Wound Bed Granulation Amount: Medium (34-66%) Exposed Structure Granulation Quality: Pink Fascia Exposed: No Necrotic Amount: Medium (34-66%) Fat Layer (Subcutaneous Tissue) Exposed: Yes Necrotic Quality: Adherent Slough Tendon Exposed: No Muscle Exposed: No Joint Exposed: No Bone Exposed: No Electronic Signature(s) Signed: 03/31/2021 4:16:02 PM By: Dolan Amen RN Entered By: Dolan Amen on 03/31/2021 13:32:07 Brianna Dodson ([id number]) -------------------------------------------------------------------------------- Vitals Details Patient Name: Brianna Dodson Date of Service: 03/31/2021 12:45 PM Medical Record Number: [id number] Patient Account Number: [id number] Date of Birth/Sex: 1933/07/12 (87 y.o. Female) Treating RN: Dolan Amen Primary Care Nevaeh Korte: [name] Other Clinician: Referring Tracie Dore: [name] Treating Burtis Imhoff/Extender: Skipper Cliche in Treatment: 0 Vital Signs Time Taken: 13:17 Temperature (F): 97.7 Height (in): 65 Pulse (bpm): 72 Source: Stated Respiratory Rate (breaths/min): 18 Weight (lbs): 141 Blood Pressure (mmHg): 99/63 Source: Stated Reference Range: 80 - 120 mg / dl Body Mass Index (BMI): 23.5 Electronic Signature(s) Signed: 03/31/2021 4:16:02 PM By: Dolan Amen RN Entered By: Dolan Amen on 03/31/2021 13:17:51

## 2021-03-31 NOTE — Progress Notes (Signed)
Brianna Dodson, Brianna Dodson ([id number]) Visit Report for 03/31/2021 Abuse/Suicide Risk Screen Details Patient Name: Brianna Dodson, Brianna Dodson Date of Service: 03/31/2021 12:45 PM Medical Record Number: [id number] Patient Account Number: [id number] Date of Birth/Sex: 05-28-1934 (87 y.o. Female) Treating RN: Dolan Amen Primary Care Ronasia Isola: [name] Other Clinician: Referring Kadien Lineman: [name] Treating Mulan Adan/Extender: Skipper Cliche in Treatment: 0 Abuse/Suicide Risk Screen Items Answer ABUSE RISK SCREEN: Has anyone close to you tried to hurt or harm you recentlyo No Do you feel uncomfortable with anyone in your familyo No Has anyone forced you do things that you didnot want to doo No Electronic Signature(s) Signed: 03/31/2021 4:16:02 PM By: Dolan Amen RN Entered By: Dolan Amen on 03/31/2021 13:24:30 Brianna Dodson ([id number]) -------------------------------------------------------------------------------- Activities of Daily Living Details Patient Name: Brianna Dodson Date of Service: 03/31/2021 12:45 PM Medical Record Number: [id number] Patient Account Number: [id number] Date of Birth/Sex: April 12, 1934 (87 y.o. Female) Treating RN: Dolan Amen Primary Care Patti Shorb: [name] Other Clinician: Referring Dabria Wadas: [name] Treating Ayaansh Smail/Extender: Skipper Cliche in Treatment: 0 Activities of Daily Living Items Answer Activities of Daily Living (Please select one for each item) Drive Automobile Not Able Take Medications Need Assistance Use Telephone Need Assistance Care for Appearance Need Assistance Use Toilet Need Assistance Bath / Shower Need Assistance Dress Self Need Assistance Feed Self Need Assistance Walk Need Assistance Get In / Out Bed Need Assistance Housework Need Assistance Prepare Meals Need Assistance Handle Money Need Assistance Shop for Self Need Assistance Electronic Signature(s) Signed: 03/31/2021 4:16:02 PM By: Dolan Amen RN Entered By: Dolan Amen on 03/31/2021 13:25:11 Brianna Dodson ([id number]) -------------------------------------------------------------------------------- Education Screening Details Patient Name: Brianna Dodson Date of Service: 03/31/2021 12:45 PM Medical Record Number: [id number] Patient Account Number: [id number] Date of Birth/Sex: [birth year] (87 y.o. Female) Treating RN: Dolan Amen Primary Care Zamantha Strebel: [name] Other Clinician: Referring Miles Leyda: [name] Treating Maudell Stanbrough/Extender: Skipper Cliche in Treatment: 0 Primary Learner Assessed: Patient Learning Preferences/Education Level/Primary Language Learning Preference: Explanation, Demonstration Preferred Language: English Cognitive Barrier Language Barrier: No Translator Needed: No Memory Deficit: No Emotional Barrier: No Cultural/Religious Beliefs Affecting Medical Care: No Physical Barrier Impaired Vision: No Impaired Hearing: No Decreased Hand dexterity: Yes Limitations: left arm paralyzed Knowledge/Comprehension Knowledge Level: Medium Comprehension Level: Medium Ability to understand written instructions: Medium Ability to understand verbal instructions: Medium Motivation Anxiety Level: Calm Cooperation: Cooperative Education Importance: Acknowledges Need Interest in Health Problems: Asks Questions Perception: Coherent Willingness to Engage in Self-Management Medium Activities: Readiness to Engage in Self-Management Medium Activities: Electronic Signature(s) Signed: 03/31/2021 4:16:02 PM By: Dolan Amen RN Entered By: Dolan Amen on 03/31/2021 13:25:47 Brianna Dodson ([id number]) -------------------------------------------------------------------------------- Fall Risk Assessment Details Patient Name: Brianna Dodson Date of Service: 03/31/2021 12:45 PM Medical Record Number: [id number] Patient Account Number: [id number] Date of Birth/Sex: [birth year] (87 y.o.  Female) Treating RN: Dolan Amen Primary Care Jodye Scali: [name] Other Clinician: Referring Renley Banwart: [name] Treating Franciscojavier Wronski/Extender: Skipper Cliche in Treatment: 0 Fall Risk Assessment Items Have you had 2 or more falls in the last 12 monthso 0 No Have you had any fall that resulted in injury in the last 12 monthso 0 No FALLS RISK SCREEN History of falling - immediate or within 3 months 0 No Secondary diagnosis (Do you have 2 or more medical diagnoseso) 15 Yes Ambulatory aid None/bed rest/wheelchair/nurse 0 Yes Crutches/cane/walker 0 No Furniture 0 No Intravenous therapy Access/Saline/Heparin Lock 0 No Gait/Transferring Normal/ bed rest/ wheelchair 0 Yes Weak (short steps with or without shuffle,  stooped but able to lift head while walking, may 0 No seek support from furniture) Impaired (short steps with shuffle, may have difficulty arising from chair, head down, impaired 0 No balance) Mental Status Oriented to own ability 0 Yes Electronic Signature(s) Signed: 03/31/2021 4:16:02 PM By: Dolan Amen RN Entered By: Dolan Amen on 03/31/2021 13:25:59 Brianna Dodson ([id number]) -------------------------------------------------------------------------------- Foot Assessment Details Patient Name: Brianna Dodson Date of Service: 03/31/2021 12:45 PM Medical Record Number: [id number] Patient Account Number: [id number] Date of Birth/Sex: 10/28/1933 (87 y.o. Female) Treating RN: Dolan Amen Primary Care Sigmond Patalano: [name] Other Clinician: Referring Rosealee Recinos: [name] Treating Marianita Botkin/Extender: Skipper Cliche in Treatment: 0 Foot Assessment Items Site Locations + = Sensation present, - = Sensation absent, C = Callus, U = Ulcer R = Redness, W = Warmth, M = Maceration, PU = Pre-ulcerative lesion F = Fissure, S = Swelling, D = Dryness Assessment Right: Left: Other Deformity: No No Prior Foot Ulcer: No No Prior Amputation: No  No Charcot Joint: No No Ambulatory Status: Ambulatory Without Help Gait: Steady Electronic Signature(s) Signed: 03/31/2021 4:16:02 PM By: Dolan Amen RN Entered By: Dolan Amen on 03/31/2021 13:26:20 Brianna Dodson ([id number]) -------------------------------------------------------------------------------- Nutrition Risk Screening Details Patient Name: Brianna Dodson Date of Service: 03/31/2021 12:45 PM Medical Record Number: [id number] Patient Account Number: [id number] Date of Birth/Sex: 01-10-1934 (87 y.o. Female) Treating RN: Dolan Amen Primary Care Lindsay Straka: [name] Other Clinician: Referring Jordie Schreur: [name] Treating Maika Kaczmarek/Extender: Skipper Cliche in Treatment: 0 Height (in): 65 Weight (lbs): 141 Body Mass Index (BMI): 23.5 Nutrition Risk Screening Items Score Screening NUTRITION RISK SCREEN: I have an illness or condition that made me change the kind and/or amount of food I eat 0 No I eat fewer than two meals per day 0 No I eat few fruits and vegetables, or milk products 0 No I have three or more drinks of beer, liquor or wine almost every day 0 No I have tooth or mouth problems that make it hard for me to eat 0 No I don't always have enough money to buy the food I need 0 No I eat alone most of the time 0 No I take three or more different prescribed or over-the-counter drugs a day 1 Yes Without wanting to, I have lost or gained 10 pounds in the last six months 0 No I am not always physically able to shop, cook and/or feed myself 0 No Nutrition Protocols Good Risk Protocol 0 No interventions needed Moderate Risk Protocol High Risk Proctocol Risk Level: Good Risk Score: 1 Electronic Signature(s) Signed: 03/31/2021 4:16:02 PM By: Dolan Amen RN Entered By: Dolan Amen on 03/31/2021 13:26:13

## 2021-04-01 DIAGNOSIS — Z23 Encounter for immunization: Secondary | ICD-10-CM | POA: Diagnosis not present

## 2021-04-01 DIAGNOSIS — N2581 Secondary hyperparathyroidism of renal origin: Secondary | ICD-10-CM | POA: Diagnosis not present

## 2021-04-01 DIAGNOSIS — N186 End stage renal disease: Secondary | ICD-10-CM | POA: Diagnosis not present

## 2021-04-01 DIAGNOSIS — D631 Anemia in chronic kidney disease: Secondary | ICD-10-CM | POA: Diagnosis not present

## 2021-04-01 DIAGNOSIS — Z992 Dependence on renal dialysis: Secondary | ICD-10-CM | POA: Diagnosis not present

## 2021-04-01 DIAGNOSIS — D509 Iron deficiency anemia, unspecified: Secondary | ICD-10-CM | POA: Diagnosis not present

## 2021-04-02 ENCOUNTER — Other Ambulatory Visit: Payer: Self-pay | Admitting: Family Medicine

## 2021-04-04 DIAGNOSIS — N2581 Secondary hyperparathyroidism of renal origin: Secondary | ICD-10-CM | POA: Diagnosis not present

## 2021-04-04 DIAGNOSIS — N186 End stage renal disease: Secondary | ICD-10-CM | POA: Diagnosis not present

## 2021-04-04 DIAGNOSIS — D509 Iron deficiency anemia, unspecified: Secondary | ICD-10-CM | POA: Diagnosis not present

## 2021-04-04 DIAGNOSIS — Z992 Dependence on renal dialysis: Secondary | ICD-10-CM | POA: Diagnosis not present

## 2021-04-04 DIAGNOSIS — D631 Anemia in chronic kidney disease: Secondary | ICD-10-CM | POA: Diagnosis not present

## 2021-04-05 DIAGNOSIS — G309 Alzheimer's disease, unspecified: Secondary | ICD-10-CM | POA: Diagnosis not present

## 2021-04-05 DIAGNOSIS — F0280 Dementia in other diseases classified elsewhere without behavioral disturbance: Secondary | ICD-10-CM | POA: Diagnosis not present

## 2021-04-05 DIAGNOSIS — I120 Hypertensive chronic kidney disease with stage 5 chronic kidney disease or end stage renal disease: Secondary | ICD-10-CM | POA: Diagnosis not present

## 2021-04-05 DIAGNOSIS — N186 End stage renal disease: Secondary | ICD-10-CM | POA: Diagnosis not present

## 2021-04-05 DIAGNOSIS — E1122 Type 2 diabetes mellitus with diabetic chronic kidney disease: Secondary | ICD-10-CM | POA: Diagnosis not present

## 2021-04-05 DIAGNOSIS — D631 Anemia in chronic kidney disease: Secondary | ICD-10-CM | POA: Diagnosis not present

## 2021-04-06 DIAGNOSIS — E559 Vitamin D deficiency, unspecified: Secondary | ICD-10-CM | POA: Diagnosis not present

## 2021-04-06 DIAGNOSIS — Z7901 Long term (current) use of anticoagulants: Secondary | ICD-10-CM | POA: Diagnosis not present

## 2021-04-06 DIAGNOSIS — G479 Sleep disorder, unspecified: Secondary | ICD-10-CM | POA: Diagnosis not present

## 2021-04-06 DIAGNOSIS — Z7984 Long term (current) use of oral hypoglycemic drugs: Secondary | ICD-10-CM | POA: Diagnosis not present

## 2021-04-06 DIAGNOSIS — N2581 Secondary hyperparathyroidism of renal origin: Secondary | ICD-10-CM | POA: Diagnosis not present

## 2021-04-06 DIAGNOSIS — Z9181 History of falling: Secondary | ICD-10-CM | POA: Diagnosis not present

## 2021-04-06 DIAGNOSIS — Z992 Dependence on renal dialysis: Secondary | ICD-10-CM | POA: Diagnosis not present

## 2021-04-06 DIAGNOSIS — Z741 Need for assistance with personal care: Secondary | ICD-10-CM | POA: Diagnosis not present

## 2021-04-06 DIAGNOSIS — I69318 Other symptoms and signs involving cognitive functions following cerebral infarction: Secondary | ICD-10-CM | POA: Diagnosis not present

## 2021-04-06 DIAGNOSIS — I4891 Unspecified atrial fibrillation: Secondary | ICD-10-CM | POA: Diagnosis not present

## 2021-04-06 DIAGNOSIS — F0280 Dementia in other diseases classified elsewhere without behavioral disturbance: Secondary | ICD-10-CM | POA: Diagnosis not present

## 2021-04-06 DIAGNOSIS — Z7902 Long term (current) use of antithrombotics/antiplatelets: Secondary | ICD-10-CM | POA: Diagnosis not present

## 2021-04-06 DIAGNOSIS — E1122 Type 2 diabetes mellitus with diabetic chronic kidney disease: Secondary | ICD-10-CM | POA: Diagnosis not present

## 2021-04-06 DIAGNOSIS — D631 Anemia in chronic kidney disease: Secondary | ICD-10-CM | POA: Diagnosis not present

## 2021-04-06 DIAGNOSIS — I6529 Occlusion and stenosis of unspecified carotid artery: Secondary | ICD-10-CM | POA: Diagnosis not present

## 2021-04-06 DIAGNOSIS — L89152 Pressure ulcer of sacral region, stage 2: Secondary | ICD-10-CM | POA: Diagnosis not present

## 2021-04-06 DIAGNOSIS — D509 Iron deficiency anemia, unspecified: Secondary | ICD-10-CM | POA: Diagnosis not present

## 2021-04-06 DIAGNOSIS — G309 Alzheimer's disease, unspecified: Secondary | ICD-10-CM | POA: Diagnosis not present

## 2021-04-06 DIAGNOSIS — S50322D Blister (nonthermal) of left elbow, subsequent encounter: Secondary | ICD-10-CM | POA: Diagnosis not present

## 2021-04-06 DIAGNOSIS — I120 Hypertensive chronic kidney disease with stage 5 chronic kidney disease or end stage renal disease: Secondary | ICD-10-CM | POA: Diagnosis not present

## 2021-04-06 DIAGNOSIS — E440 Moderate protein-calorie malnutrition: Secondary | ICD-10-CM | POA: Diagnosis not present

## 2021-04-06 DIAGNOSIS — E041 Nontoxic single thyroid nodule: Secondary | ICD-10-CM | POA: Diagnosis not present

## 2021-04-06 DIAGNOSIS — F0150 Vascular dementia without behavioral disturbance: Secondary | ICD-10-CM | POA: Diagnosis not present

## 2021-04-06 DIAGNOSIS — E785 Hyperlipidemia, unspecified: Secondary | ICD-10-CM | POA: Diagnosis not present

## 2021-04-06 DIAGNOSIS — N186 End stage renal disease: Secondary | ICD-10-CM | POA: Diagnosis not present

## 2021-04-06 DIAGNOSIS — D72819 Decreased white blood cell count, unspecified: Secondary | ICD-10-CM | POA: Diagnosis not present

## 2021-04-07 ENCOUNTER — Encounter: Payer: Medicare Other | Attending: Physician Assistant | Admitting: Physician Assistant

## 2021-04-07 ENCOUNTER — Other Ambulatory Visit: Payer: Self-pay

## 2021-04-07 DIAGNOSIS — Z8673 Personal history of transient ischemic attack (TIA), and cerebral infarction without residual deficits: Secondary | ICD-10-CM | POA: Diagnosis not present

## 2021-04-07 DIAGNOSIS — E1122 Type 2 diabetes mellitus with diabetic chronic kidney disease: Secondary | ICD-10-CM | POA: Diagnosis not present

## 2021-04-07 DIAGNOSIS — Z7901 Long term (current) use of anticoagulants: Secondary | ICD-10-CM | POA: Insufficient documentation

## 2021-04-07 DIAGNOSIS — N186 End stage renal disease: Secondary | ICD-10-CM | POA: Diagnosis not present

## 2021-04-07 DIAGNOSIS — L89323 Pressure ulcer of left buttock, stage 3: Secondary | ICD-10-CM | POA: Diagnosis not present

## 2021-04-07 DIAGNOSIS — Z992 Dependence on renal dialysis: Secondary | ICD-10-CM | POA: Insufficient documentation

## 2021-04-07 NOTE — Progress Notes (Addendum)
Brianna, Dodson ([id number]) Visit Report for 04/07/2021 Chief Complaint Document Details Patient Name: Brianna Dodson, Brianna Dodson Date of Service: 04/07/2021 2:30 PM Medical Record Number: [id number] Patient Account Number: [id number] Date of Birth/Sex: 12-Mar-1934 (87 y.o. F) Treating RN: Cornell Barman Primary Care Provider: [name] Other Clinician: Referring Provider: [name] Treating Provider/Extender: Skipper Cliche in Treatment: 1 Information Obtained from: Patient Chief Complaint Pressure ulcer left gluteus Electronic Signature(s) Signed: 04/07/2021 3:09:36 PM By: Worthy Keeler PA-C Entered By: Worthy Keeler on 04/07/2021 15:09:35 Brianna Dodson ([id number]) -------------------------------------------------------------------------------- HPI Details Patient Name: Brianna Dodson Date of Service: 04/07/2021 2:30 PM Medical Record Number: [id number] Patient Account Number: [id number] Date of Birth/Sex: 08-22-1933 (87 y.o. F) Treating RN: Cornell Barman Primary Care Provider: [name] Other Clinician: Referring Provider: [name] Treating Provider/Extender: Skipper Cliche in Treatment: 1 History of Present Illness HPI Description: 03/31/2021 upon evaluation today patient presents for initial inspection here in our clinic concerning issues that she has been having unfortunately with a pressure ulcer in the left gluteal region that actually occurred as a result of her having been in the hospital due to an issue with her dialysis access in the left arm. This was following surgery. She is ended up with nerve damage unfortunately. With that being said she was sent after the hospital stay to a rehab facility and tells me that "the bed was really bad". With that being said she has been using Silvadene on this they have also been using a foam to cover overall it is fairly moist that is the main issue that I see. The good news is its not very deep. It is causing her a lot of  pain however. Patient does have a history of diabetes mellitus type 2, end-stage renal disease with dependence on renal dialysis, and history of stroke fortunately there is no significant residual deficits from a movement standpoint though I think her speech is somewhat affected. She is on anticoagulant therapy due to this history of stroke. 04/07/2021 upon evaluation today patient appears to be doing better in regard to her sacral wound although she is not really been using the [facility] if she had I think this will be even better. Nonetheless I do not think she likes the pressure of the dressing in this area. Electronic Signature(s) Signed: 04/08/2021 8:12:49 AM By: Worthy Keeler PA-C Entered By: Worthy Keeler on 04/08/2021 08:12:49 Brianna Dodson ([id number]) -------------------------------------------------------------------------------- Physical Exam Details Patient Name: Brianna Dodson Date of Service: 04/07/2021 2:30 PM Medical Record Number: [id number] Patient Account Number: [id number] Date of Birth/Sex: [birth year] (87 y.o. F) Treating RN: Cornell Barman Primary Care Provider: [name] Other Clinician: Referring Provider: [name] Treating Provider/Extender: Skipper Cliche in Treatment: 1 Constitutional Well-nourished and well-hydrated in no acute distress. Respiratory normal breathing without difficulty. Neurological cranial nerves 2-12 intact. Patient has normal sensation in the feet bilaterally to light touch. Notes Upon inspection patient's wound bed in the sacral area actually appears to be doing awesome and in fact I think is close to complete clearing. I think derma cloud could be a good option here also I think zinc is actually a fairly good option as well. Electronic Signature(s) Signed: 04/08/2021 8:13:07 AM By: Worthy Keeler PA-C Entered By: Worthy Keeler on 04/08/2021 08:13:07 Brianna Dodson  ([id number]) -------------------------------------------------------------------------------- Physician Orders Details Patient Name: Brianna Dodson Date of Service: 04/07/2021 2:30 PM Medical Record Number: [id number] Patient Account Number: [id number] Date of Birth/Sex: 1934/06/19 (87 y.o. F) Treating RN: Cornell Barman  Primary Care Provider: [name] Other Clinician: Referring Provider: [name] Treating Provider/Extender: Skipper Cliche in Treatment: 1 Verbal / Phone Orders: No Diagnosis Coding ICD-10 Coding Code Description [id number] Pressure ulcer of left buttock, stage 3 E11.622 Type 2 diabetes mellitus with other skin ulcer N18.6 End stage renal disease Z99.2 Dependence on renal dialysis Z79.01 Long term (current) use of anticoagulants Follow-up Appointments o Return Appointment in 1 week. [place] for wound care. May utilize formulary equivalent dressing for wound treatment orders unless otherwise specified. Home Health Nurse may visit PRN to address patientos wound care needs. o Scheduled days for dressing changes to be completed; exception, patient has scheduled wound care visit that day. - Sister and patient instructed to change dressing in between home health visits to reduce moisture accumulation o **Please direct any NON-WOUND related issues/requests for orders to patient's Primary Care Physician. **If current dressing causes regression in wound condition, may D/C ordered dressing product/s and apply Normal Saline Moist Dressing daily until next [facility] or Other MD appointment. **Notify [facility] of regression in wound condition at [phone]. Bathing/ Shower/ Hygiene o May shower; gently cleanse wound with antibacterial soap, rinse and pat dry prior to dressing wounds o No tub bath. Off-Loading o Turn and reposition every 2 hours Wound Treatment Wound #1 - Gluteus  Wound Laterality: Left Cleanser: Wound Cleanser 3 x Per Week/30 Days Discharge Instructions: Wash your hands with soap and water. Remove old dressing, discard into plastic bag and place into trash. Cleanse the wound with Wound Cleanser prior to applying a clean dressing using gauze sponges, not tissues or cotton balls. Do not scrub or use excessive force. Pat dry using gauze sponges, not tissue or cotton balls. Peri-Wound Care: Desitin Maximum Strength Ointment 4 (oz) 3 x Per Week/30 Days Discharge Instructions: on and around wound for moisture control Electronic Signature(s) Signed: 04/07/2021 3:54:11 PM By: Gretta Cool, BSN, RN, CWS, Kim RN, BSN Signed: 04/08/2021 9:28:39 AM By: Worthy Keeler PA-C Entered By: Gretta Cool, BSN, RN, CWS, Kim on 04/07/2021 15:54:10 Brianna Dodson ([id number]) -------------------------------------------------------------------------------- Problem List Details Patient Name: Brianna Dodson Date of Service: 04/07/2021 2:30 PM Medical Record Number: [id number] Patient Account Number: [id number] Date of Birth/Sex: 1933/10/29 (87 y.o. F) Treating RN: Cornell Barman Primary Care Provider: [name] Other Clinician: Referring Provider: [name] Treating Provider/Extender: Skipper Cliche in Treatment: 1 Active Problems ICD-10 Encounter Code Description Active Date MDM Diagnosis [phone] Pressure ulcer of left buttock, stage 3 03/31/2021 No Yes E11.622 Type 2 diabetes mellitus with other skin ulcer 03/31/2021 No Yes N18.6 End stage renal disease 03/31/2021 No Yes Z99.2 Dependence on renal dialysis 03/31/2021 No Yes Z79.01 Long term (current) use of anticoagulants 03/31/2021 No Yes Inactive Problems Resolved Problems Electronic Signature(s) Signed: 04/07/2021 3:09:30 PM By: Worthy Keeler PA-C Entered By: Worthy Keeler on 04/07/2021 15:09:29 Brianna Dodson ([id number]) -------------------------------------------------------------------------------- Progress Note  Details Patient Name: Brianna Dodson Date of Service: 04/07/2021 2:30 PM Medical Record Number: [id number] Patient Account Number: [id number] Date of Birth/Sex: 05/07/1934 (87 y.o. F) Treating RN: Cornell Barman Primary Care Provider: [name] Other Clinician: Referring Provider: [name] Treating Provider/Extender: Skipper Cliche in Treatment: 1 Subjective Chief Complaint Information obtained from Patient Pressure ulcer left gluteus History of Present Illness (HPI) 03/31/2021 upon evaluation today patient presents for initial inspection here in our clinic concerning issues that she has been having unfortunately with a pressure ulcer in the left gluteal region that  actually occurred as a result of her having been in the hospital due to an issue with her dialysis access in the left arm. This was following surgery. She is ended up with nerve damage unfortunately. With that being said she was sent after the hospital stay to a rehab facility and tells me that "the bed was really bad". With that being said she has been using Silvadene on this they have also been using a foam to cover overall it is fairly moist that is the main issue that I see. The good news is its not very deep. It is causing her a lot of pain however. Patient does have a history of diabetes mellitus type 2, end-stage renal disease with dependence on renal dialysis, and history of stroke fortunately there is no significant residual deficits from a movement standpoint though I think her speech is somewhat affected. She is on anticoagulant therapy due to this history of stroke. 04/07/2021 upon evaluation today patient appears to be doing better in regard to her sacral wound although she is not really been using the [facility] if she had I think this will be even better. Nonetheless I do not think she likes the pressure of the dressing in this area. Objective Constitutional Well-nourished and well-hydrated in no  acute distress. Vitals Time Taken: 2:58 PM, Height: 65 in, Weight: 141 lbs, BMI: 23.5, Temperature: 98.3 F, Pulse: 72 bpm, Respiratory Rate: 18 breaths/min, Blood Pressure: 117/56 mmHg. Respiratory normal breathing without difficulty. Neurological cranial nerves 2-12 intact. Patient has normal sensation in the feet bilaterally to light touch. General Notes: Upon inspection patient's wound bed in the sacral area actually appears to be doing awesome and in fact I think is close to complete clearing. I think derma cloud could be a good option here also I think zinc is actually a fairly good option as well. Integumentary (Hair, Skin) Wound #1 status is Open. Original cause of wound was Pressure Injury. The date acquired was: 02/28/2021. The wound has been in treatment 1 weeks. The wound is located on the Left Gluteus. The wound measures 2.5cm length x 1.2cm width x 0.2cm depth; 2.356cm^2 area and 0.471cm^3 volume. There is Fat Layer (Subcutaneous Tissue) exposed. There is a medium amount of serosanguineous drainage noted. There is medium (34-66%) pink granulation within the wound bed. There is a medium (34-66%) amount of necrotic tissue within the wound bed including Adherent Slough. General Notes: Wound margins are macerated. Patient states she removed the dressing because it hurt and has been using SILVADENE, nutty butter salve and peroxide on the wound once daily. Assessment Active Problems SHELLIA, HARTL ([id number]) ICD-10 Pressure ulcer of left buttock, stage 3 Type 2 diabetes mellitus with other skin ulcer End stage renal disease Dependence on renal dialysis Long term (current) use of anticoagulants Plan Follow-up Appointments: Return Appointment in 1 week. Home Health: [place]: [facility] for wound care. May utilize formulary equivalent dressing for wound treatment orders unless otherwise specified. Home Health Nurse may visit PRN to address patient s wound  care needs. Scheduled days for dressing changes to be completed; exception, patient has scheduled wound care visit that day. - Sister and patient instructed to change dressing in between home health visits to reduce moisture accumulation **Please direct any NON-WOUND related issues/requests for orders to patient's Primary Care Physician. **If current dressing causes regression in wound condition, may D/C ordered dressing product/s and apply Normal Saline Moist Dressing daily until next [place] or Other MD  appointment. **Notify [facility] of regression in wound condition at [phone]. Bathing/ Shower/ Hygiene: May shower; gently cleanse wound with antibacterial soap, rinse and pat dry prior to dressing wounds No tub bath. Off-Loading: Turn and reposition every 2 hours WOUND #1: - Gluteus Wound Laterality: Left Cleanser: Wound Cleanser 3 x Per Week/30 Days Discharge Instructions: Wash your hands with soap and water. Remove old dressing, discard into plastic bag and place into trash. Cleanse the wound with Wound Cleanser prior to applying a clean dressing using gauze sponges, not tissues or cotton balls. Do not scrub or use excessive force. Pat dry using gauze sponges, not tissue or cotton balls. Peri-Wound Care: Desitin Maximum Strength Ointment 4 (oz) 3 x Per Week/30 Days Discharge Instructions: on and around wound for moisture control 1. Would recommend currently that we going continue with the wound care measures as before with regard to offloading I think that still of utmost importance I discussed that with him again today. 2. With regard to the sacral region I think zinc is probably can be our best option here and I did go ahead and recommend derma cloud as a better option if they can get their hands on that we did give him a print out in that regard as well. We will see patient back for reevaluation in 2 weeks here in the clinic. If anything worsens or changes  patient will contact our office for additional recommendations. Electronic Signature(s) Signed: 04/08/2021 8:13:40 AM By: Worthy Keeler PA-C Entered By: Worthy Keeler on 04/08/2021 08:13:40 Brianna Dodson ([id number]) -------------------------------------------------------------------------------- SuperBill Details Patient Name: Brianna Dodson Date of Service: 04/07/2021 Medical Record Number: [id number] Patient Account Number: [id number] Date of Birth/Sex: [birth year] (87 y.o. F) Treating RN: Cornell Barman Primary Care Provider: [name] Other Clinician: Referring Provider: [name] Treating Provider/Extender: Skipper Cliche in Treatment: 1 Diagnosis Coding ICD-10 Codes Code Description [id number] Pressure ulcer of left buttock, stage 3 E11.622 Type 2 diabetes mellitus with other skin ulcer N18.6 End stage renal disease Z99.2 Dependence on renal dialysis Z79.01 Long term (current) use of anticoagulants Facility Procedures CPT4 Code: [id number] Description: [phone] - WOUND CARE VISIT-LEV 2 EST PT Modifier: Quantity: 1 Physician Procedures CPT4 Code: [id number] Description: 55732 - WC PHYS LEVEL 3 - EST PT Modifier: Quantity: 1 CPT4 Code: Description: ICD-10 Diagnosis Description L89.323 Pressure ulcer of left buttock, stage 3 E11.622 Type 2 diabetes mellitus with other skin ulcer N18.6 End stage renal disease Z99.2 Dependence on renal dialysis Modifier: Quantity: Electronic Signature(s) Signed: 04/08/2021 8:13:55 AM By: Worthy Keeler PA-C Entered By: Worthy Keeler on 04/08/2021 08:13:55

## 2021-04-08 DIAGNOSIS — Z992 Dependence on renal dialysis: Secondary | ICD-10-CM | POA: Diagnosis not present

## 2021-04-08 DIAGNOSIS — D631 Anemia in chronic kidney disease: Secondary | ICD-10-CM | POA: Diagnosis not present

## 2021-04-08 DIAGNOSIS — N2581 Secondary hyperparathyroidism of renal origin: Secondary | ICD-10-CM | POA: Diagnosis not present

## 2021-04-08 DIAGNOSIS — N186 End stage renal disease: Secondary | ICD-10-CM | POA: Diagnosis not present

## 2021-04-08 DIAGNOSIS — D509 Iron deficiency anemia, unspecified: Secondary | ICD-10-CM | POA: Diagnosis not present

## 2021-04-08 NOTE — Progress Notes (Addendum)
Brianna Dodson, Brianna Dodson ([id number]) Visit Report for 04/07/2021 Arrival Information Details Patient Name: Brianna Dodson, Brianna Dodson Date of Service: 04/07/2021 2:30 PM Medical Record Number: [id number] Patient Account Number: [id number] Date of Birth/Sex: 12-14-1933 (87 y.o. F) Treating RN: Cornell Barman Primary Care Keshawna Dix: [name] Other Clinician: Referring Dhana Totton: [name] Treating Miroslava Santellan/Extender: Skipper Cliche in Treatment: 1 Visit Information History Since Last Visit Added or deleted any medications: No Patient Arrived: Cane Has Dressing in Place as Prescribed: Yes Arrival Time: 14:53 Pain Present Now: No Accompanied By: granddaughter Transfer Assistance: None Patient Identification Verified: Yes Secondary Verification Process Completed: Yes Electronic Signature(s) Signed: 04/08/2021 11:42:15 AM By: Gretta Cool, BSN, RN, CWS, Kim RN, BSN Entered By: Gretta Cool, BSN, RN, CWS, Kim on 04/07/2021 14:58:49 Brianna Dodson ([id number]) -------------------------------------------------------------------------------- Clinic Level of Care Assessment Details Patient Name: Brianna Dodson Date of Service: 04/07/2021 2:30 PM Medical Record Number: [id number] Patient Account Number: [id number] Date of Birth/Sex: 1934-01-17 (87 y.o. F) Treating RN: Cornell Barman Primary Care Gaylon Melchor: [name] Other Clinician: Referring Renard Caperton: [name] Treating Koraima Albertsen/Extender: Skipper Cliche in Treatment: 1 Clinic Level of Care Assessment Items TOOL 4 Quantity Score []  - Use when only an EandM is performed on FOLLOW-UP visit 0 ASSESSMENTS - Nursing Assessment / Reassessment X - Reassessment of Co-morbidities (includes updates in patient status) 1 10 X- 1 5 Reassessment of Adherence to Treatment Plan ASSESSMENTS - Wound and Skin Assessment / Reassessment X - Simple Wound Assessment / Reassessment - one wound 1 5 []  - 0 Complex Wound Assessment / Reassessment - multiple wounds []  -  0 Dermatologic / Skin Assessment (not related to wound area) ASSESSMENTS - Focused Assessment []  - Circumferential Edema Measurements - multi extremities 0 []  - 0 Nutritional Assessment / Counseling / Intervention []  - 0 Lower Extremity Assessment (monofilament, tuning fork, pulses) []  - 0 Peripheral Arterial Disease Assessment (using hand held doppler) ASSESSMENTS - Ostomy and/or Continence Assessment and Care []  - Incontinence Assessment and Management 0 []  - 0 Ostomy Care Assessment and Management (repouching, etc.) PROCESS - Coordination of Care X - Simple Patient / Family Education for ongoing care 1 15 []  - 0 Complex (extensive) Patient / Family Education for ongoing care X- 1 10 Staff obtains Programmer, systems, Records, Test Results / Process Orders []  - 0 Staff telephones HHA, Nursing Homes / Clarify orders / etc []  - 0 Routine Transfer to another Facility (non-emergent condition) []  - 0 Routine Hospital Admission (non-emergent condition) []  - 0 New Admissions / Biomedical engineer / Ordering NPWT, Apligraf, etc. []  - 0 Emergency Hospital Admission (emergent condition) X- 1 10 Simple Discharge Coordination []  - 0 Complex (extensive) Discharge Coordination PROCESS - Special Needs []  - Pediatric / Minor Patient Management 0 []  - 0 Isolation Patient Management []  - 0 Hearing / Language / Visual special needs []  - 0 Assessment of Community assistance (transportation, D/C planning, etc.) []  - 0 Additional assistance / Altered mentation []  - 0 Support Surface(s) Assessment (bed, cushion, seat, etc.) INTERVENTIONS - Wound Cleansing / Measurement [place], Zofia ([id number]) X- 1 5 Simple Wound Cleansing - one wound []  - 0 Complex Wound Cleansing - multiple wounds X- 1 5 Wound Imaging (photographs - any number of wounds) []  - 0 Wound Tracing (instead of photographs) X- 1 5 Simple Wound Measurement - one wound []  - 0 Complex Wound Measurement - multiple  wounds INTERVENTIONS - Wound Dressings []  - Small Wound Dressing one or multiple wounds 0 []  - 0 Medium Wound Dressing one or multiple wounds []  -  0 Large Wound Dressing one or multiple wounds []  - 0 Application of Medications - topical []  - 0 Application of Medications - injection INTERVENTIONS - Miscellaneous []  - External ear exam 0 []  - 0 Specimen Collection (cultures, biopsies, blood, body fluids, etc.) []  - 0 Specimen(s) / Culture(s) sent or taken to Lab for analysis []  - 0 Patient Transfer (multiple staff / Civil Service fast streamer / Similar devices) []  - 0 Simple Staple / Suture removal (25 or less) []  - 0 Complex Staple / Suture removal (26 or more) []  - 0 Hypo / Hyperglycemic Management (close monitor of Blood Glucose) []  - 0 Ankle / Brachial Index (ABI) - do not check if billed separately X- 1 5 Vital Signs Has the patient been seen at the hospital within the last three years: Yes Total Score: 75 Level Of Care: New/Established - Level 2 Electronic Signature(s) Signed: 04/08/2021 11:42:15 AM By: Gretta Cool, BSN, RN, CWS, Kim RN, BSN Entered By: Gretta Cool, BSN, RN, CWS, Kim on 04/07/2021 15:54:49 Brianna Dodson ([id number]) -------------------------------------------------------------------------------- Encounter Discharge Information Details Patient Name: Brianna Dodson Date of Service: 04/07/2021 2:30 PM Medical Record Number: [id number] Patient Account Number: [id number] Date of Birth/Sex: 1934-06-14 (87 y.o. F) Treating RN: Cornell Barman Primary Care Giannah Zavadil: [name] Other Clinician: Referring Laiza Veenstra: [name] Treating Maryhelen Lindler/Extender: Skipper Cliche in Treatment: 1 Encounter Discharge Information Items Discharge Condition: Stable Ambulatory Status: Ambulatory Discharge Destination: Home Transportation: Private Auto Accompanied By: granddaughter Schedule Follow-up Appointment: Yes Clinical Summary of Care: Electronic Signature(s) Signed: 04/07/2021  3:56:32 PM By: Gretta Cool, BSN, RN, CWS, Kim RN, BSN Entered By: Gretta Cool, BSN, RN, CWS, Kim on 04/07/2021 15:56:31 Brianna Dodson ([id number]) -------------------------------------------------------------------------------- Lower Extremity Assessment Details Patient Name: Brianna Dodson Date of Service: 04/07/2021 2:30 PM Medical Record Number: [id number] Patient Account Number: [id number] Date of Birth/Sex: 1933/10/29 (87 y.o. F) Treating RN: Cornell Barman Primary Care Lige Lakeman: [name] Other Clinician: Referring Dearl Rudden: [name] Treating Charlii Yost/Extender: Skipper Cliche in Treatment: 1 Electronic Signature(s) Signed: 04/08/2021 11:42:15 AM By: Gretta Cool, BSN, RN, CWS, Kim RN, BSN Entered By: Gretta Cool, BSN, RN, CWS, Kim on 04/07/2021 15:06:14 Brianna Dodson ([id number]) -------------------------------------------------------------------------------- Multi Wound Chart Details Patient Name: Brianna Dodson Date of Service: 04/07/2021 2:30 PM Medical Record Number: [id number] Patient Account Number: [id number] Date of Birth/Sex: April 18, 1934 (87 y.o. F) Treating RN: Cornell Barman Primary Care Haleem Hanner: [name] Other Clinician: Referring Tildon Silveria: [name] Treating Brailon Don/Extender: Skipper Cliche in Treatment: 1 Vital Signs Height(in): 84 Pulse(bpm): 53 Weight(lbs): 141 Blood Pressure(mmHg): 117/56 Body Mass Index(BMI): 23 Temperature(F): 98.3 Respiratory Rate(breaths/min): 18 Photos: [1:No Photos] [N/A:N/A] Wound Location: [1:Left Gluteus] [N/A:N/A] Wounding Event: [1:Pressure Injury] [N/A:N/A] Primary Etiology: [1:Pressure Ulcer] [N/A:N/A] Comorbid History: [1:Cataracts, Type II Diabetes] [N/A:N/A] Date Acquired: [1:02/28/2021] [N/A:N/A] Weeks of Treatment: [1:1] [N/A:N/A] Wound Status: [1:Open] [N/A:N/A] Measurements L x W x D (cm) [1:2.5x1.2x0.2] [N/A:N/A] Area (cm) : [1:2.356] [N/A:N/A] Volume (cm) : [1:0.471] [N/A:N/A] % Reduction in Area: [1:-25.00%]  [N/A:N/A] % Reduction in Volume: [1:-24.90%] [N/A:N/A] Classification: [1:Category/Stage III] [N/A:N/A] Exudate Amount: [1:Medium] [N/A:N/A] Exudate Type: [1:Serosanguineous] [N/A:N/A] Exudate Color: [1:red, brown] [N/A:N/A] Granulation Amount: [1:Medium (34-66%)] [N/A:N/A] Granulation Quality: [1:Pink] [N/A:N/A] Necrotic Amount: [1:Medium (34-66%)] [N/A:N/A] Exposed Structures: [1:Fat Layer (Subcutaneous Tissue): Yes Fascia: No Tendon: No Muscle: No Joint: No Bone: No] [N/A:N/A] Epithelialization: [1:Small (1-33%)] [N/A:N/A] Assessment Notes: [1:Wound margins are macerated. Patient states she removed the dressing because it hurt and has been using SILVADENE, nutty butter salve and peroxide on the wound once daily.] [N/A:N/A] Treatment Notes Electronic Signature(s) Signed: 04/08/2021 11:42:15 AM By:  Gretta Cool, BSN, RN, CWS, Kim RN, BSN Entered By: Gretta Cool, BSN, RN, CWS, Kim on 04/07/2021 15:22:50 Brianna Dodson ([id number]) -------------------------------------------------------------------------------- Multi-Disciplinary Care Plan Details Patient Name: Brianna Dodson Date of Service: 04/07/2021 2:30 PM Medical Record Number: [id number] Patient Account Number: [id number] Date of Birth/Sex: Jun 04, 1934 (87 y.o. F) Treating RN: Cornell Barman Primary Care Earlean Fidalgo: [name] Other Clinician: Referring Stelios Kirby: [name] Treating Ohana Birdwell/Extender: Skipper Cliche in Treatment: 1 Active Inactive Electronic Signature(s) Signed: 05/04/2021 12:11:56 PM By: Gretta Cool, BSN, RN, CWS, Kim RN, BSN Previous Signature: 04/08/2021 11:42:15 AM Version By: Gretta Cool, BSN, RN, CWS, Kim RN, BSN Entered By: Gretta Cool, BSN, RN, CWS, Kim on 05/04/2021 12:11:56 Brianna Dodson ([id number]) -------------------------------------------------------------------------------- Pain Assessment Details Patient Name: Brianna Dodson Date of Service: 04/07/2021 2:30 PM Medical Record Number: [id number] Patient Account Number:  [id number] Date of Birth/Sex: 09-03-1933 (87 y.o. F) Treating RN: Cornell Barman Primary Care Steffani Dionisio: [name] Other Clinician: Referring Arlene Genova: [name] Treating Lindley Hiney/Extender: Skipper Cliche in Treatment: 1 Active Problems Location of Pain Severity and Description of Pain Patient Has Paino No Site Locations Pain Management and Medication Current Pain Management: Notes Patient denies pain at this time. Electronic Signature(s) Signed: 04/08/2021 11:42:15 AM By: Gretta Cool, BSN, RN, CWS, Kim RN, BSN Entered By: Gretta Cool, BSN, RN, CWS, Kim on 04/07/2021 14:59:37 Brianna Dodson ([id number]) -------------------------------------------------------------------------------- Patient/Caregiver Education Details Patient Name: Brianna Dodson Date of Service: 04/07/2021 2:30 PM Medical Record Number: [id number] Patient Account Number: [id number] Date of Birth/Gender: 15-Jan-1934 (87 y.o. F) Treating RN: Cornell Barman Primary Care Physician: [name] Other Clinician: Referring Physician: [name] Treating Physician/Extender: Skipper Cliche in Treatment: 1 Education Assessment Education Provided To: Patient Education Topics Provided Wound/Skin Impairment: Handouts: Caring for Your Ulcer, Other: DO not use hydrogen peroxide, silvadene or Nutty Butter Cream on wound Methods: Explain/Verbal Responses: State content correctly Electronic Signature(s) Signed: 04/08/2021 11:42:15 AM By: Gretta Cool, BSN, RN, CWS, Kim RN, BSN Entered By: Gretta Cool, BSN, RN, CWS, Kim on 04/07/2021 15:55:50 Brianna Dodson ([id number]) -------------------------------------------------------------------------------- Wound Assessment Details Patient Name: Brianna Dodson Date of Service: 04/07/2021 2:30 PM Medical Record Number: [id number] Patient Account Number: [id number] Date of Birth/Sex: Aug 09, 1933 (87 y.o. F) Treating RN: Cornell Barman Primary Care Merisa Julio: [name] Other Clinician: Referring  Shown Dissinger: [name] Treating Eduard Penkala/Extender: Jeri Cos Weeks in Treatment: 1 Wound Status Wound Number: 1 Primary Etiology: Pressure Ulcer Wound Location: Left Gluteus Wound Status: Open Wounding Event: Pressure Injury Comorbid History: Cataracts, Type II Diabetes Date Acquired: 02/28/2021 Weeks Of Treatment: 1 Clustered Wound: No Wound Measurements Length: (cm) 2.5 Width: (cm) 1.2 Depth: (cm) 0.2 Area: (cm) 2.356 Volume: (cm) 0.471 % Reduction in Area: -25% % Reduction in Volume: -24.9% Epithelialization: Small (1-33%) Wound Description Classification: Category/Stage III Exudate Amount: Medium Exudate Type: Serosanguineous Exudate Color: red, brown Foul Odor After Cleansing: No Slough/Fibrino Yes Wound Bed Granulation Amount: Medium (34-66%) Exposed Structure Granulation Quality: Pink Fascia Exposed: No Necrotic Amount: Medium (34-66%) Fat Layer (Subcutaneous Tissue) Exposed: Yes Necrotic Quality: Adherent Slough Tendon Exposed: No Muscle Exposed: No Joint Exposed: No Bone Exposed: No Assessment Notes Wound margins are macerated. Patient states she removed the dressing because it hurt and has been using SILVADENE, nutty butter salve and peroxide on the wound once daily. Electronic Signature(s) Signed: 04/08/2021 11:42:15 AM By: Gretta Cool, BSN, RN, CWS, Kim RN, BSN Entered By: Gretta Cool, BSN, RN, CWS, Kim on 04/07/2021 15:06:04 Brianna Dodson ([id number]) -------------------------------------------------------------------------------- Vitals Details Patient Name: Brianna Dodson Date of Service: 04/07/2021 2:30 PM Medical Record Number: [id number] Patient Account Number: [id number] Date  of Birth/Sex: Feb 08, 1934 (87 y.o. F) Treating RN: Cornell Barman Primary Care Jillian Warth: [name] Other Clinician: Referring Zeeshan Korte: [name] Treating Makena Mcgrady/Extender: Skipper Cliche in Treatment: 1 Vital Signs Time Taken: 14:58 Temperature (F):  98.3 Height (in): 65 Pulse (bpm): 72 Weight (lbs): 141 Respiratory Rate (breaths/min): 18 Body Mass Index (BMI): 23.5 Blood Pressure (mmHg): 117/56 Reference Range: 80 - 120 mg / dl Electronic Signature(s) Signed: 04/08/2021 11:42:15 AM By: Gretta Cool, BSN, RN, CWS, Kim RN, BSN Entered By: Gretta Cool, BSN, RN, CWS, Kim on 04/07/2021 14:59:09

## 2021-04-11 DIAGNOSIS — N2581 Secondary hyperparathyroidism of renal origin: Secondary | ICD-10-CM | POA: Diagnosis not present

## 2021-04-11 DIAGNOSIS — D631 Anemia in chronic kidney disease: Secondary | ICD-10-CM | POA: Diagnosis not present

## 2021-04-11 DIAGNOSIS — N186 End stage renal disease: Secondary | ICD-10-CM | POA: Diagnosis not present

## 2021-04-11 DIAGNOSIS — Z992 Dependence on renal dialysis: Secondary | ICD-10-CM | POA: Diagnosis not present

## 2021-04-11 DIAGNOSIS — D509 Iron deficiency anemia, unspecified: Secondary | ICD-10-CM | POA: Diagnosis not present

## 2021-04-12 ENCOUNTER — Ambulatory Visit (INDEPENDENT_AMBULATORY_CARE_PROVIDER_SITE_OTHER): Payer: Medicare Other | Admitting: Family Medicine

## 2021-04-12 ENCOUNTER — Other Ambulatory Visit: Payer: Self-pay

## 2021-04-12 VITALS — BP 150/60 | HR 71 | Temp 98.8°F | Ht 65.0 in | Wt 145.2 lb

## 2021-04-12 DIAGNOSIS — L89152 Pressure ulcer of sacral region, stage 2: Secondary | ICD-10-CM | POA: Diagnosis not present

## 2021-04-12 DIAGNOSIS — I639 Cerebral infarction, unspecified: Secondary | ICD-10-CM | POA: Diagnosis not present

## 2021-04-12 DIAGNOSIS — G5601 Carpal tunnel syndrome, right upper limb: Secondary | ICD-10-CM | POA: Diagnosis not present

## 2021-04-12 DIAGNOSIS — S50322D Blister (nonthermal) of left elbow, subsequent encounter: Secondary | ICD-10-CM | POA: Diagnosis not present

## 2021-04-12 DIAGNOSIS — E041 Nontoxic single thyroid nodule: Secondary | ICD-10-CM | POA: Diagnosis not present

## 2021-04-12 DIAGNOSIS — M79602 Pain in left arm: Secondary | ICD-10-CM

## 2021-04-12 DIAGNOSIS — I120 Hypertensive chronic kidney disease with stage 5 chronic kidney disease or end stage renal disease: Secondary | ICD-10-CM | POA: Diagnosis not present

## 2021-04-12 DIAGNOSIS — N186 End stage renal disease: Secondary | ICD-10-CM | POA: Diagnosis not present

## 2021-04-12 DIAGNOSIS — E1122 Type 2 diabetes mellitus with diabetic chronic kidney disease: Secondary | ICD-10-CM | POA: Diagnosis not present

## 2021-04-12 DIAGNOSIS — Z992 Dependence on renal dialysis: Secondary | ICD-10-CM | POA: Diagnosis not present

## 2021-04-12 DIAGNOSIS — I10 Essential (primary) hypertension: Secondary | ICD-10-CM

## 2021-04-12 DIAGNOSIS — D631 Anemia in chronic kidney disease: Secondary | ICD-10-CM | POA: Diagnosis not present

## 2021-04-12 MED ORDER — PREGABALIN 25 MG PO CAPS: 25.0000 mg | ORAL_CAPSULE | ORAL | 1 refills | Status: DC

## 2021-04-12 NOTE — Assessment & Plan Note (Signed)
Generally stable.  They report good control at home.  She will continue her carvedilol, amlodipine, and hydralazine.  She had a recent BMP through nephrology.

## 2021-04-12 NOTE — Assessment & Plan Note (Signed)
Ultrasound ordered for follow-up on thyroid nodule.

## 2021-04-12 NOTE — Progress Notes (Signed)
Brianna Rumps, MD Phone: [phone]  Brianna Dodson is a 87 y.o. female who presents today for f/u.  HYPERTENSION Disease Monitoring Home BP Monitoring "good" Chest pain- no    Dyspnea- no Medications Compliance-  taking coreg, amlodipine, hydralazine.   Edema- no BMET    Component Value Date/Time   NA 138 03/28/2021 1620   NA 142 11/10/2013 0522   K 2.8 (L) 03/28/2021 1620   K 4.6 11/10/2013 0522   CL 97 (L) 03/28/2021 1620   CL 110 (H) 11/10/2013 0522   CO2 31 03/28/2021 1620   CO2 27 11/10/2013 0522   GLUCOSE 117 (H) 03/28/2021 1620   GLUCOSE 70 11/10/2013 0522   BUN 9 03/28/2021 1620   BUN 22 (H) 11/10/2013 0522   CREATININE 2.26 (H) 03/28/2021 1620   CREATININE 1.12 11/10/2013 0522   CALCIUM 8.4 (L) 03/28/2021 1620   CALCIUM 8.4 (L) 11/10/2013 0522   GFRNONAA 21 (L) 03/28/2021 1620   GFRNONAA 47 (L) 11/10/2013 0522   GFRAA 27 (L) 08/22/2019 0449   GFRAA 54 (L) 11/10/2013 0522   DIABETES Disease Monitoring: Blood Sugar ranges-130s-150s Polyuria/phagia/dipsia- no      Optho- UTD, requesting records Medications: Compliance- taking tradjenta Hypoglycemic symptoms- no  Left arm swelling and pain and left hand weakness: Patient notes the pain is not all the time at this point.  Mostly bothering her at night.  She had to go to the ED due to the pain and was given oxycodone.  She is on gabapentin 100 mg 3 times a week after dialysis.  This has not made a difference.  Her vascular surgeon tried to send in 300 mg capsules though the nephrologist advised no.  They note the swelling is somewhat better.  They note the neurologist advised them that the left arm weakness was not related to the prior stroke.  Right fingertip numbness: This is been going on for month or so.  It occurs intermittently.  Sometimes she wakes up with the numbness.  No numbness currently.  No numbness in her feet.  Thyroid nodule: Patient is due for follow-up imaging on this.  Social History    Tobacco Use  Smoking Status Never  Smokeless Tobacco Never    Current Outpatient Medications on File Prior to Visit  Medication Sig Dispense Refill   acetaminophen (TYLENOL) 325 MG tablet Take 2 tablets (650 mg total) by mouth every 6 (six) hours as needed for mild pain, fever or moderate pain. (Patient taking differently: Take 1,000 mg by mouth every 8 (eight) hours as needed for mild pain, fever or moderate pain.)     amLODipine (NORVASC) 5 MG tablet Take 1 tablet (5 mg total) by mouth daily. Hold if SBP less than 130 mmHg     ascorbic acid (VITAMIN C) 500 MG tablet Take 1 tablet (500 mg total) by mouth daily.     atorvastatin (LIPITOR) 80 MG tablet Take 1 tablet (80 mg total) by mouth daily. 90 tablet 3   carvedilol (COREG) 6.25 MG tablet TAKE 1 TABLET BY MOUTH 2 TIMES DAILY WITH A MEAL. 180 tablet 1   doxycycline (VIBRAMYCIN) 100 MG capsule Take 1 capsule (100 mg total) by mouth 2 (two) times daily. 28 capsule 0   ELIQUIS 2.5 MG TABS tablet Take 1 tablet (2.5 mg total) by mouth 2 (two) times daily. 56 tablet 0   ezetimibe (ZETIA) 10 MG tablet TAKE 1 TABLET BY MOUTH EVERY DAY 90 tablet 3   Ferrous Sulfate (IRON) 28 MG TABS  Take 1 tablet by mouth daily in the afternoon.     glucose blood (ACCU-CHEK GUIDE) test strip USE TO CHECK BLOOD SUGARS TWICE DAILY. E11.9 300 each 3   hydrALAZINE (APRESOLINE) 50 MG tablet Take 1 tablet (50 mg total) by mouth in the morning and at bedtime. Hold if SBP  less than 140 mmHg     Lancets (ONETOUCH ULTRASOFT) lancets Use as instructed. Use patient insurance preferred brand. 300 each 3   linagliptin (TRADJENTA) 5 MG TABS tablet Take 1 tablet (5 mg total) by mouth daily. 90 tablet 3   liver oil-zinc oxide (DESITIN) 40 % ointment Apply topically as needed for irritation. 56.7 g 0   mirtazapine (REMERON) 7.5 MG tablet Take 7.5 mg by mouth at bedtime.     multivitamin (RENA-VIT) TABS tablet Take 1 tablet by mouth at bedtime.  0   Nutritional Supplements  (FEEDING SUPPLEMENT, NEPRO CARB STEADY,) LIQD Take 237 mLs by mouth 2 (two) times daily between meals.  0   senna-docusate (SENOKOT-S) 8.6-50 MG tablet Take 1 tablet by mouth at bedtime as needed for mild constipation.     Vitamin D, Ergocalciferol, (DRISDOL) 50000 units CAPS capsule Take 50,000 Units by mouth every 30 (thirty) days.  1   No current facility-administered medications on file prior to visit.     ROS see history of present illness  Objective  Physical Exam Vitals:   04/12/21 1132  BP: (!) 150/60  Pulse: 71  Temp: 98.8 F (37.1 C)  SpO2: 99%    BP Readings from Last 3 Encounters:  04/12/21 (!) 150/60  03/29/21 (!) 121/58  03/28/21 (!) 155/70   Wt Readings from Last 3 Encounters:  04/12/21 145 lb 3.2 oz (65.9 kg)  03/29/21 141 lb (64 kg)  03/28/21 141 lb (64 kg)    Physical Exam Constitutional:      General: She is not in acute distress.    Appearance: She is not diaphoretic.  Cardiovascular:     Rate and Rhythm: Normal rate and regular rhythm.     Heart sounds: Normal heart sounds.  Pulmonary:     Effort: Pulmonary effort is normal.     Breath sounds: Normal breath sounds.  Musculoskeletal:     Comments: Left hand and forearm continue to be swollen though appears somewhat improved from previously, there is no tenderness over the left hand or distal forearm, 2+ left radial pulse, sensation is intact in bilateral hands, she has positive Tinel's at the right wrist  Skin:    General: Skin is warm and dry.  Neurological:     Mental Status: She is alert.     Assessment/Plan: Please see individual problem list.  Problem List Items Addressed This Visit     Carpal tunnel syndrome on right    This is the likely cause of her right fingertip numbness based on her exam.  She will buy a cock up splint and try to wear this at night.  If they are not able to find 1 or if this is not beneficial they will let us know.      Relevant Medications   pregabalin  (LYRICA) 25 MG capsule (Start on 04/13/2021)   Essential hypertension    Generally stable.  They report good control at home.  She will continue her carvedilol, amlodipine, and hydralazine.  She had a recent BMP through nephrology.      Left arm pain    Continues to have discomfort mostly at night.  Gabapentin was  not terribly beneficial and given her renal function it is difficult to titrate this up.  She will be given a trial of Lyrica 25 mg 3 times a week after dialysis.  She will monitor for drowsiness.  She will discontinue the gabapentin.      Thyroid nodule - Primary    Ultrasound ordered for follow-up on thyroid nodule.      Relevant Orders   US THYROID   Type II diabetes mellitus with renal manifestations ([place])    Very well controlled on most recent A1c.  She will continue Tradjenta.       Return in about 3 months (around 07/13/2021).  This visit occurred during the SARS-CoV-2 public health emergency.  Safety protocols were in place, including screening questions prior to the visit, additional usage of staff PPE, and extensive cleaning of exam room while observing appropriate contact time as indicated for disinfecting solutions.    Brianna Rumps, MD [place]

## 2021-04-12 NOTE — Assessment & Plan Note (Signed)
This is the likely cause of her right fingertip numbness based on her exam.  She will buy a cock up splint and try to wear this at night.  If they are not able to find 1 or if this is not beneficial they will let us know.

## 2021-04-12 NOTE — Patient Instructions (Addendum)
Nice to see you. Please try the Lyrica 3 times a week taken after dialysis.  Please monitor for drowsiness with starting this medication.  You will discontinue the gabapentin. Please continue to work with occupational therapy for your left hand. Somebody will call you to schedule the ultrasound of your thyroid.

## 2021-04-12 NOTE — Assessment & Plan Note (Signed)
Very well controlled on most recent A1c.  She will continue Tradjenta.

## 2021-04-12 NOTE — Assessment & Plan Note (Signed)
Continues to have discomfort mostly at night.  Gabapentin was not terribly beneficial and given her renal function it is difficult to titrate this up.  She will be given a trial of Lyrica 25 mg 3 times a week after dialysis.  She will monitor for drowsiness.  She will discontinue the gabapentin.

## 2021-04-13 DIAGNOSIS — N2581 Secondary hyperparathyroidism of renal origin: Secondary | ICD-10-CM | POA: Diagnosis not present

## 2021-04-13 DIAGNOSIS — N186 End stage renal disease: Secondary | ICD-10-CM | POA: Diagnosis not present

## 2021-04-13 DIAGNOSIS — D509 Iron deficiency anemia, unspecified: Secondary | ICD-10-CM | POA: Diagnosis not present

## 2021-04-13 DIAGNOSIS — Z992 Dependence on renal dialysis: Secondary | ICD-10-CM | POA: Diagnosis not present

## 2021-04-13 DIAGNOSIS — D631 Anemia in chronic kidney disease: Secondary | ICD-10-CM | POA: Diagnosis not present

## 2021-04-14 DIAGNOSIS — I120 Hypertensive chronic kidney disease with stage 5 chronic kidney disease or end stage renal disease: Secondary | ICD-10-CM | POA: Diagnosis not present

## 2021-04-14 DIAGNOSIS — L89152 Pressure ulcer of sacral region, stage 2: Secondary | ICD-10-CM | POA: Diagnosis not present

## 2021-04-14 DIAGNOSIS — E1122 Type 2 diabetes mellitus with diabetic chronic kidney disease: Secondary | ICD-10-CM | POA: Diagnosis not present

## 2021-04-14 DIAGNOSIS — N186 End stage renal disease: Secondary | ICD-10-CM | POA: Diagnosis not present

## 2021-04-14 DIAGNOSIS — D631 Anemia in chronic kidney disease: Secondary | ICD-10-CM | POA: Diagnosis not present

## 2021-04-14 DIAGNOSIS — S50322D Blister (nonthermal) of left elbow, subsequent encounter: Secondary | ICD-10-CM | POA: Diagnosis not present

## 2021-04-15 DIAGNOSIS — N2581 Secondary hyperparathyroidism of renal origin: Secondary | ICD-10-CM | POA: Diagnosis not present

## 2021-04-15 DIAGNOSIS — N186 End stage renal disease: Secondary | ICD-10-CM | POA: Diagnosis not present

## 2021-04-15 DIAGNOSIS — D509 Iron deficiency anemia, unspecified: Secondary | ICD-10-CM | POA: Diagnosis not present

## 2021-04-15 DIAGNOSIS — Z992 Dependence on renal dialysis: Secondary | ICD-10-CM | POA: Diagnosis not present

## 2021-04-15 DIAGNOSIS — D631 Anemia in chronic kidney disease: Secondary | ICD-10-CM | POA: Diagnosis not present

## 2021-04-18 DIAGNOSIS — N186 End stage renal disease: Secondary | ICD-10-CM | POA: Diagnosis not present

## 2021-04-18 DIAGNOSIS — D631 Anemia in chronic kidney disease: Secondary | ICD-10-CM | POA: Diagnosis not present

## 2021-04-18 DIAGNOSIS — N2581 Secondary hyperparathyroidism of renal origin: Secondary | ICD-10-CM | POA: Diagnosis not present

## 2021-04-18 DIAGNOSIS — D509 Iron deficiency anemia, unspecified: Secondary | ICD-10-CM | POA: Diagnosis not present

## 2021-04-18 DIAGNOSIS — Z992 Dependence on renal dialysis: Secondary | ICD-10-CM | POA: Diagnosis not present

## 2021-04-19 DIAGNOSIS — S50322D Blister (nonthermal) of left elbow, subsequent encounter: Secondary | ICD-10-CM | POA: Diagnosis not present

## 2021-04-19 DIAGNOSIS — N186 End stage renal disease: Secondary | ICD-10-CM | POA: Diagnosis not present

## 2021-04-19 DIAGNOSIS — I120 Hypertensive chronic kidney disease with stage 5 chronic kidney disease or end stage renal disease: Secondary | ICD-10-CM | POA: Diagnosis not present

## 2021-04-19 DIAGNOSIS — E1122 Type 2 diabetes mellitus with diabetic chronic kidney disease: Secondary | ICD-10-CM | POA: Diagnosis not present

## 2021-04-19 DIAGNOSIS — D631 Anemia in chronic kidney disease: Secondary | ICD-10-CM | POA: Diagnosis not present

## 2021-04-19 DIAGNOSIS — L89152 Pressure ulcer of sacral region, stage 2: Secondary | ICD-10-CM | POA: Diagnosis not present

## 2021-04-20 DIAGNOSIS — Z992 Dependence on renal dialysis: Secondary | ICD-10-CM | POA: Diagnosis not present

## 2021-04-20 DIAGNOSIS — N2581 Secondary hyperparathyroidism of renal origin: Secondary | ICD-10-CM | POA: Diagnosis not present

## 2021-04-20 DIAGNOSIS — N186 End stage renal disease: Secondary | ICD-10-CM | POA: Diagnosis not present

## 2021-04-20 DIAGNOSIS — D631 Anemia in chronic kidney disease: Secondary | ICD-10-CM | POA: Diagnosis not present

## 2021-04-20 DIAGNOSIS — D509 Iron deficiency anemia, unspecified: Secondary | ICD-10-CM | POA: Diagnosis not present

## 2021-04-21 DIAGNOSIS — D631 Anemia in chronic kidney disease: Secondary | ICD-10-CM | POA: Diagnosis not present

## 2021-04-21 DIAGNOSIS — I120 Hypertensive chronic kidney disease with stage 5 chronic kidney disease or end stage renal disease: Secondary | ICD-10-CM | POA: Diagnosis not present

## 2021-04-21 DIAGNOSIS — L89152 Pressure ulcer of sacral region, stage 2: Secondary | ICD-10-CM | POA: Diagnosis not present

## 2021-04-21 DIAGNOSIS — E1122 Type 2 diabetes mellitus with diabetic chronic kidney disease: Secondary | ICD-10-CM | POA: Diagnosis not present

## 2021-04-21 DIAGNOSIS — N186 End stage renal disease: Secondary | ICD-10-CM | POA: Diagnosis not present

## 2021-04-21 DIAGNOSIS — S50322D Blister (nonthermal) of left elbow, subsequent encounter: Secondary | ICD-10-CM | POA: Diagnosis not present

## 2021-04-22 DIAGNOSIS — D631 Anemia in chronic kidney disease: Secondary | ICD-10-CM | POA: Diagnosis not present

## 2021-04-22 DIAGNOSIS — Z992 Dependence on renal dialysis: Secondary | ICD-10-CM | POA: Diagnosis not present

## 2021-04-22 DIAGNOSIS — D509 Iron deficiency anemia, unspecified: Secondary | ICD-10-CM | POA: Diagnosis not present

## 2021-04-22 DIAGNOSIS — N2581 Secondary hyperparathyroidism of renal origin: Secondary | ICD-10-CM | POA: Diagnosis not present

## 2021-04-22 DIAGNOSIS — N186 End stage renal disease: Secondary | ICD-10-CM | POA: Diagnosis not present

## 2021-04-25 ENCOUNTER — Telehealth: Payer: Self-pay

## 2021-04-25 DIAGNOSIS — N2581 Secondary hyperparathyroidism of renal origin: Secondary | ICD-10-CM | POA: Diagnosis not present

## 2021-04-25 DIAGNOSIS — D631 Anemia in chronic kidney disease: Secondary | ICD-10-CM | POA: Diagnosis not present

## 2021-04-25 DIAGNOSIS — N186 End stage renal disease: Secondary | ICD-10-CM | POA: Diagnosis not present

## 2021-04-25 DIAGNOSIS — Z992 Dependence on renal dialysis: Secondary | ICD-10-CM | POA: Diagnosis not present

## 2021-04-25 DIAGNOSIS — D509 Iron deficiency anemia, unspecified: Secondary | ICD-10-CM | POA: Diagnosis not present

## 2021-04-25 NOTE — Chronic Care Management (AMB) (Signed)
  Care Management   Note  04/25/2021 Name: Brianna Dodson MRN: [id number] DOB: 11-Mar-1934  Lyncoln Maskell is a 87 y.o. year old female who is a primary care patient of [name], MD and is actively engaged with the care management team. I reached out to [name] by phone today to assist with re-scheduling a follow up visit with the Pharmacist  Follow up plan: Unsuccessful telephone outreach attempt made. A HIPAA compliant phone message was left for the patient providing contact information and requesting a return call.  The care management team will reach out to the patient again over the next 1 days.  If patient returns call to provider office, please advise to call [facility]  at [place], [address], [place], [place] [address] Direct Dial: [phone] [email or web address]: [email or web address]

## 2021-04-26 ENCOUNTER — Other Ambulatory Visit: Payer: Self-pay

## 2021-04-26 ENCOUNTER — Ambulatory Visit
Admission: RE | Admit: 2021-04-26 | Discharge: 2021-04-26 | Disposition: A | Discharge status: Home / Self Care | Payer: Medicare Other | Source: Ambulatory Visit | Attending: Family Medicine | Admitting: Family Medicine

## 2021-04-26 DIAGNOSIS — E042 Nontoxic multinodular goiter: Secondary | ICD-10-CM | POA: Diagnosis not present

## 2021-04-26 DIAGNOSIS — E041 Nontoxic single thyroid nodule: Secondary | ICD-10-CM | POA: Insufficient documentation

## 2021-04-27 DIAGNOSIS — N186 End stage renal disease: Secondary | ICD-10-CM | POA: Diagnosis not present

## 2021-04-27 DIAGNOSIS — D509 Iron deficiency anemia, unspecified: Secondary | ICD-10-CM | POA: Diagnosis not present

## 2021-04-27 DIAGNOSIS — N2581 Secondary hyperparathyroidism of renal origin: Secondary | ICD-10-CM | POA: Diagnosis not present

## 2021-04-27 DIAGNOSIS — D631 Anemia in chronic kidney disease: Secondary | ICD-10-CM | POA: Diagnosis not present

## 2021-04-27 DIAGNOSIS — Z992 Dependence on renal dialysis: Secondary | ICD-10-CM | POA: Diagnosis not present

## 2021-04-28 ENCOUNTER — Ambulatory Visit: Payer: Medicare Other | Admitting: Physician Assistant

## 2021-04-28 ENCOUNTER — Telehealth: Payer: Medicare Other

## 2021-04-28 DIAGNOSIS — N186 End stage renal disease: Secondary | ICD-10-CM | POA: Diagnosis not present

## 2021-04-28 DIAGNOSIS — S50322D Blister (nonthermal) of left elbow, subsequent encounter: Secondary | ICD-10-CM | POA: Diagnosis not present

## 2021-04-28 DIAGNOSIS — I120 Hypertensive chronic kidney disease with stage 5 chronic kidney disease or end stage renal disease: Secondary | ICD-10-CM | POA: Diagnosis not present

## 2021-04-28 DIAGNOSIS — E1122 Type 2 diabetes mellitus with diabetic chronic kidney disease: Secondary | ICD-10-CM | POA: Diagnosis not present

## 2021-04-28 DIAGNOSIS — D631 Anemia in chronic kidney disease: Secondary | ICD-10-CM | POA: Diagnosis not present

## 2021-04-28 DIAGNOSIS — L89152 Pressure ulcer of sacral region, stage 2: Secondary | ICD-10-CM | POA: Diagnosis not present

## 2021-04-29 DIAGNOSIS — D631 Anemia in chronic kidney disease: Secondary | ICD-10-CM | POA: Diagnosis not present

## 2021-04-29 DIAGNOSIS — Z992 Dependence on renal dialysis: Secondary | ICD-10-CM | POA: Diagnosis not present

## 2021-04-29 DIAGNOSIS — N2581 Secondary hyperparathyroidism of renal origin: Secondary | ICD-10-CM | POA: Diagnosis not present

## 2021-04-29 DIAGNOSIS — N186 End stage renal disease: Secondary | ICD-10-CM | POA: Diagnosis not present

## 2021-04-29 DIAGNOSIS — D509 Iron deficiency anemia, unspecified: Secondary | ICD-10-CM | POA: Diagnosis not present

## 2021-05-02 DIAGNOSIS — D509 Iron deficiency anemia, unspecified: Secondary | ICD-10-CM | POA: Diagnosis not present

## 2021-05-02 DIAGNOSIS — N186 End stage renal disease: Secondary | ICD-10-CM | POA: Diagnosis not present

## 2021-05-02 DIAGNOSIS — N2581 Secondary hyperparathyroidism of renal origin: Secondary | ICD-10-CM | POA: Diagnosis not present

## 2021-05-02 DIAGNOSIS — Z992 Dependence on renal dialysis: Secondary | ICD-10-CM | POA: Diagnosis not present

## 2021-05-02 DIAGNOSIS — D631 Anemia in chronic kidney disease: Secondary | ICD-10-CM | POA: Diagnosis not present

## 2021-05-03 DIAGNOSIS — S50322D Blister (nonthermal) of left elbow, subsequent encounter: Secondary | ICD-10-CM | POA: Diagnosis not present

## 2021-05-03 DIAGNOSIS — L89152 Pressure ulcer of sacral region, stage 2: Secondary | ICD-10-CM | POA: Diagnosis not present

## 2021-05-03 DIAGNOSIS — I120 Hypertensive chronic kidney disease with stage 5 chronic kidney disease or end stage renal disease: Secondary | ICD-10-CM | POA: Diagnosis not present

## 2021-05-03 DIAGNOSIS — E1122 Type 2 diabetes mellitus with diabetic chronic kidney disease: Secondary | ICD-10-CM | POA: Diagnosis not present

## 2021-05-03 DIAGNOSIS — D631 Anemia in chronic kidney disease: Secondary | ICD-10-CM | POA: Diagnosis not present

## 2021-05-03 DIAGNOSIS — N186 End stage renal disease: Secondary | ICD-10-CM | POA: Diagnosis not present

## 2021-05-04 DIAGNOSIS — D509 Iron deficiency anemia, unspecified: Secondary | ICD-10-CM | POA: Diagnosis not present

## 2021-05-04 DIAGNOSIS — N2581 Secondary hyperparathyroidism of renal origin: Secondary | ICD-10-CM | POA: Diagnosis not present

## 2021-05-04 DIAGNOSIS — N186 End stage renal disease: Secondary | ICD-10-CM | POA: Diagnosis not present

## 2021-05-04 DIAGNOSIS — Z992 Dependence on renal dialysis: Secondary | ICD-10-CM | POA: Diagnosis not present

## 2021-05-05 DIAGNOSIS — L89152 Pressure ulcer of sacral region, stage 2: Secondary | ICD-10-CM | POA: Diagnosis not present

## 2021-05-05 DIAGNOSIS — S50322D Blister (nonthermal) of left elbow, subsequent encounter: Secondary | ICD-10-CM | POA: Diagnosis not present

## 2021-05-05 DIAGNOSIS — I120 Hypertensive chronic kidney disease with stage 5 chronic kidney disease or end stage renal disease: Secondary | ICD-10-CM | POA: Diagnosis not present

## 2021-05-05 DIAGNOSIS — H04123 Dry eye syndrome of bilateral lacrimal glands: Secondary | ICD-10-CM | POA: Diagnosis not present

## 2021-05-05 DIAGNOSIS — N186 End stage renal disease: Secondary | ICD-10-CM | POA: Diagnosis not present

## 2021-05-05 DIAGNOSIS — D631 Anemia in chronic kidney disease: Secondary | ICD-10-CM | POA: Diagnosis not present

## 2021-05-05 DIAGNOSIS — E1122 Type 2 diabetes mellitus with diabetic chronic kidney disease: Secondary | ICD-10-CM | POA: Diagnosis not present

## 2021-05-06 DIAGNOSIS — Z992 Dependence on renal dialysis: Secondary | ICD-10-CM | POA: Diagnosis not present

## 2021-05-06 DIAGNOSIS — N186 End stage renal disease: Secondary | ICD-10-CM | POA: Diagnosis not present

## 2021-05-06 DIAGNOSIS — D509 Iron deficiency anemia, unspecified: Secondary | ICD-10-CM | POA: Diagnosis not present

## 2021-05-06 DIAGNOSIS — N2581 Secondary hyperparathyroidism of renal origin: Secondary | ICD-10-CM | POA: Diagnosis not present

## 2021-05-11 DIAGNOSIS — Z992 Dependence on renal dialysis: Secondary | ICD-10-CM | POA: Diagnosis not present

## 2021-05-11 DIAGNOSIS — N2581 Secondary hyperparathyroidism of renal origin: Secondary | ICD-10-CM | POA: Diagnosis not present

## 2021-05-11 DIAGNOSIS — D509 Iron deficiency anemia, unspecified: Secondary | ICD-10-CM | POA: Diagnosis not present

## 2021-05-11 DIAGNOSIS — N186 End stage renal disease: Secondary | ICD-10-CM | POA: Diagnosis not present

## 2021-05-13 DIAGNOSIS — N186 End stage renal disease: Secondary | ICD-10-CM | POA: Diagnosis not present

## 2021-05-13 DIAGNOSIS — N2581 Secondary hyperparathyroidism of renal origin: Secondary | ICD-10-CM | POA: Diagnosis not present

## 2021-05-13 DIAGNOSIS — Z992 Dependence on renal dialysis: Secondary | ICD-10-CM | POA: Diagnosis not present

## 2021-05-13 DIAGNOSIS — D509 Iron deficiency anemia, unspecified: Secondary | ICD-10-CM | POA: Diagnosis not present

## 2021-05-16 ENCOUNTER — Emergency Department: Payer: Medicare Other

## 2021-05-16 ENCOUNTER — Inpatient Hospital Stay
Admission: EM | Admit: 2021-05-16 | Discharge: 2021-06-05 | DRG: 091 | Disposition: A | Discharge status: Hospice | Payer: Medicare Other | Attending: Internal Medicine | Admitting: Internal Medicine

## 2021-05-16 DIAGNOSIS — I161 Hypertensive emergency: Secondary | ICD-10-CM | POA: Diagnosis present

## 2021-05-16 DIAGNOSIS — R7881 Bacteremia: Secondary | ICD-10-CM | POA: Diagnosis not present

## 2021-05-16 DIAGNOSIS — Z841 Family history of disorders of kidney and ureter: Secondary | ICD-10-CM

## 2021-05-16 DIAGNOSIS — Y848 Other medical procedures as the cause of abnormal reaction of the patient, or of later complication, without mention of misadventure at the time of the procedure: Secondary | ICD-10-CM | POA: Diagnosis not present

## 2021-05-16 DIAGNOSIS — B9561 Methicillin susceptible Staphylococcus aureus infection as the cause of diseases classified elsewhere: Secondary | ICD-10-CM | POA: Diagnosis not present

## 2021-05-16 DIAGNOSIS — N186 End stage renal disease: Secondary | ICD-10-CM | POA: Diagnosis not present

## 2021-05-16 DIAGNOSIS — F0280 Dementia in other diseases classified elsewhere without behavioral disturbance: Secondary | ICD-10-CM | POA: Diagnosis present

## 2021-05-16 DIAGNOSIS — I639 Cerebral infarction, unspecified: Secondary | ICD-10-CM

## 2021-05-16 DIAGNOSIS — D631 Anemia in chronic kidney disease: Secondary | ICD-10-CM | POA: Diagnosis present

## 2021-05-16 DIAGNOSIS — I69354 Hemiplegia and hemiparesis following cerebral infarction affecting left non-dominant side: Secondary | ICD-10-CM

## 2021-05-16 DIAGNOSIS — J939 Pneumothorax, unspecified: Secondary | ICD-10-CM

## 2021-05-16 DIAGNOSIS — R451 Restlessness and agitation: Secondary | ICD-10-CM | POA: Diagnosis not present

## 2021-05-16 DIAGNOSIS — G319 Degenerative disease of nervous system, unspecified: Secondary | ICD-10-CM | POA: Diagnosis not present

## 2021-05-16 DIAGNOSIS — F02818 Dementia in other diseases classified elsewhere, unspecified severity, with other behavioral disturbance: Secondary | ICD-10-CM | POA: Diagnosis present

## 2021-05-16 DIAGNOSIS — I69318 Other symptoms and signs involving cognitive functions following cerebral infarction: Secondary | ICD-10-CM

## 2021-05-16 DIAGNOSIS — N2581 Secondary hyperparathyroidism of renal origin: Secondary | ICD-10-CM | POA: Diagnosis not present

## 2021-05-16 DIAGNOSIS — D509 Iron deficiency anemia, unspecified: Secondary | ICD-10-CM | POA: Diagnosis not present

## 2021-05-16 DIAGNOSIS — L8990 Pressure ulcer of unspecified site, unspecified stage: Secondary | ICD-10-CM | POA: Diagnosis present

## 2021-05-16 DIAGNOSIS — I160 Hypertensive urgency: Secondary | ICD-10-CM

## 2021-05-16 DIAGNOSIS — R4182 Altered mental status, unspecified: Secondary | ICD-10-CM | POA: Diagnosis not present

## 2021-05-16 DIAGNOSIS — R000 Tachycardia, unspecified: Secondary | ICD-10-CM | POA: Diagnosis not present

## 2021-05-16 DIAGNOSIS — Z833 Family history of diabetes mellitus: Secondary | ICD-10-CM

## 2021-05-16 DIAGNOSIS — G4733 Obstructive sleep apnea (adult) (pediatric): Secondary | ICD-10-CM | POA: Diagnosis present

## 2021-05-16 DIAGNOSIS — G309 Alzheimer's disease, unspecified: Secondary | ICD-10-CM | POA: Diagnosis present

## 2021-05-16 DIAGNOSIS — B957 Other staphylococcus as the cause of diseases classified elsewhere: Secondary | ICD-10-CM | POA: Diagnosis present

## 2021-05-16 DIAGNOSIS — I63411 Cerebral infarction due to embolism of right middle cerebral artery: Secondary | ICD-10-CM | POA: Diagnosis not present

## 2021-05-16 DIAGNOSIS — Z515 Encounter for palliative care: Secondary | ICD-10-CM | POA: Diagnosis not present

## 2021-05-16 DIAGNOSIS — Z7901 Long term (current) use of anticoagulants: Secondary | ICD-10-CM

## 2021-05-16 DIAGNOSIS — R569 Unspecified convulsions: Secondary | ICD-10-CM

## 2021-05-16 DIAGNOSIS — I6782 Cerebral ischemia: Secondary | ICD-10-CM | POA: Diagnosis not present

## 2021-05-16 DIAGNOSIS — F29 Unspecified psychosis not due to a substance or known physiological condition: Secondary | ICD-10-CM | POA: Diagnosis not present

## 2021-05-16 DIAGNOSIS — Z992 Dependence on renal dialysis: Secondary | ICD-10-CM

## 2021-05-16 DIAGNOSIS — I959 Hypotension, unspecified: Secondary | ICD-10-CM | POA: Diagnosis not present

## 2021-05-16 DIAGNOSIS — Z886 Allergy status to analgesic agent status: Secondary | ICD-10-CM

## 2021-05-16 DIAGNOSIS — E440 Moderate protein-calorie malnutrition: Secondary | ICD-10-CM | POA: Diagnosis present

## 2021-05-16 DIAGNOSIS — R509 Fever, unspecified: Secondary | ICD-10-CM

## 2021-05-16 DIAGNOSIS — G928 Other toxic encephalopathy: Principal | ICD-10-CM | POA: Diagnosis present

## 2021-05-16 DIAGNOSIS — E785 Hyperlipidemia, unspecified: Secondary | ICD-10-CM | POA: Diagnosis present

## 2021-05-16 DIAGNOSIS — R410 Disorientation, unspecified: Secondary | ICD-10-CM

## 2021-05-16 DIAGNOSIS — I63311 Cerebral infarction due to thrombosis of right middle cerebral artery: Secondary | ICD-10-CM | POA: Diagnosis not present

## 2021-05-16 DIAGNOSIS — T80219A Unspecified infection due to central venous catheter, initial encounter: Secondary | ICD-10-CM | POA: Diagnosis not present

## 2021-05-16 DIAGNOSIS — I63431 Cerebral infarction due to embolism of right posterior cerebral artery: Secondary | ICD-10-CM | POA: Diagnosis not present

## 2021-05-16 DIAGNOSIS — Z6821 Body mass index (BMI) 21.0-21.9, adult: Secondary | ICD-10-CM

## 2021-05-16 DIAGNOSIS — I120 Hypertensive chronic kidney disease with stage 5 chronic kidney disease or end stage renal disease: Secondary | ICD-10-CM | POA: Diagnosis present

## 2021-05-16 DIAGNOSIS — F0150 Vascular dementia without behavioral disturbance: Secondary | ICD-10-CM | POA: Diagnosis present

## 2021-05-16 DIAGNOSIS — Z79899 Other long term (current) drug therapy: Secondary | ICD-10-CM

## 2021-05-16 DIAGNOSIS — I10 Essential (primary) hypertension: Secondary | ICD-10-CM | POA: Diagnosis present

## 2021-05-16 DIAGNOSIS — E1122 Type 2 diabetes mellitus with diabetic chronic kidney disease: Secondary | ICD-10-CM | POA: Diagnosis present

## 2021-05-16 DIAGNOSIS — I4891 Unspecified atrial fibrillation: Secondary | ICD-10-CM | POA: Diagnosis present

## 2021-05-16 DIAGNOSIS — F01518 Vascular dementia, unspecified severity, with other behavioral disturbance: Secondary | ICD-10-CM | POA: Diagnosis present

## 2021-05-16 DIAGNOSIS — Z20822 Contact with and (suspected) exposure to covid-19: Secondary | ICD-10-CM | POA: Diagnosis not present

## 2021-05-16 DIAGNOSIS — E1129 Type 2 diabetes mellitus with other diabetic kidney complication: Secondary | ICD-10-CM | POA: Diagnosis present

## 2021-05-16 DIAGNOSIS — Z66 Do not resuscitate: Secondary | ICD-10-CM | POA: Diagnosis present

## 2021-05-16 DIAGNOSIS — D696 Thrombocytopenia, unspecified: Secondary | ICD-10-CM | POA: Diagnosis present

## 2021-05-16 DIAGNOSIS — Z8249 Family history of ischemic heart disease and other diseases of the circulatory system: Secondary | ICD-10-CM

## 2021-05-16 DIAGNOSIS — F02811 Dementia in other diseases classified elsewhere, unspecified severity, with agitation: Secondary | ICD-10-CM | POA: Diagnosis present

## 2021-05-16 DIAGNOSIS — F05 Delirium due to known physiological condition: Secondary | ICD-10-CM | POA: Diagnosis present

## 2021-05-16 DIAGNOSIS — L89150 Pressure ulcer of sacral region, unstageable: Secondary | ICD-10-CM | POA: Diagnosis present

## 2021-05-16 DIAGNOSIS — I480 Paroxysmal atrial fibrillation: Secondary | ICD-10-CM | POA: Diagnosis present

## 2021-05-16 DIAGNOSIS — B182 Chronic viral hepatitis C: Secondary | ICD-10-CM | POA: Diagnosis present

## 2021-05-16 DIAGNOSIS — Z823 Family history of stroke: Secondary | ICD-10-CM

## 2021-05-16 DIAGNOSIS — L816 Other disorders of diminished melanin formation: Secondary | ICD-10-CM | POA: Diagnosis present

## 2021-05-16 LAB — URINALYSIS, COMPLETE (UACMP) WITH MICROSCOPIC
Bacteria, UA: NONE SEEN
Bilirubin Urine: NEGATIVE
Glucose, UA: NEGATIVE mg/dL
Hgb urine dipstick: NEGATIVE
Ketones, ur: NEGATIVE mg/dL
Nitrite: NEGATIVE
Protein, ur: 100 mg/dL — AB
Specific Gravity, Urine: 1.013 (ref 1.005–1.030)
pH: 8 (ref 5.0–8.0)

## 2021-05-16 LAB — COMPREHENSIVE METABOLIC PANEL
ALT: 22 U/L (ref 0–44)
AST: 45 U/L — ABNORMAL HIGH (ref 15–41)
Albumin: 3.8 g/dL (ref 3.5–5.0)
Alkaline Phosphatase: 66 U/L (ref 38–126)
Anion gap: 9 (ref 5–15)
BUN: 17 mg/dL (ref 8–23)
CO2: 32 mmol/L (ref 22–32)
Calcium: 8.3 mg/dL — ABNORMAL LOW (ref 8.9–10.3)
Chloride: 95 mmol/L — ABNORMAL LOW (ref 98–111)
Creatinine, Ser: 2.54 mg/dL — ABNORMAL HIGH (ref 0.44–1.00)
GFR, Estimated: 18 mL/min — ABNORMAL LOW (ref >60)
Glucose, Bld: 195 mg/dL — ABNORMAL HIGH (ref 70–99)
Potassium: 4.6 mmol/L (ref 3.5–5.1)
Sodium: 136 mmol/L (ref 135–145)
Total Bilirubin: 1.1 mg/dL (ref 0.3–1.2)
Total Protein: 7.9 g/dL (ref 6.5–8.1)

## 2021-05-16 LAB — BLOOD GAS, ARTERIAL
Acid-Base Excess: 11.2 mmol/L — ABNORMAL HIGH (ref 0.0–2.0)
Bicarbonate: 35.1 mmol/L — ABNORMAL HIGH (ref 20.0–28.0)
FIO2: 0.21
O2 Saturation: 97.5 %
Patient temperature: 37
pCO2 arterial: 42 mmHg (ref 32.0–48.0)
pH, Arterial: 7.53 — ABNORMAL HIGH (ref 7.350–7.450)
pO2, Arterial: 85 mmHg (ref 83.0–108.0)

## 2021-05-16 LAB — RESP PANEL BY RT-PCR (FLU A&B, COVID) ARPGX2
Influenza A by PCR: NEGATIVE
Influenza B by PCR: NEGATIVE
SARS Coronavirus 2 by RT PCR: NEGATIVE

## 2021-05-16 MED ORDER — LORAZEPAM 2 MG/ML IJ SOLN: 2.0000 mg | Freq: Once | INTRAMUSCULAR | Status: AC

## 2021-05-16 MED ORDER — HALOPERIDOL LACTATE 5 MG/ML IJ SOLN
2.5000 mg | Freq: Once | INTRAMUSCULAR | Status: AC
  Administered 2021-05-16: 2.5 mg via INTRAMUSCULAR
  Filled 2021-05-16: qty 1

## 2021-05-16 MED ORDER — MIDAZOLAM HCL 2 MG/2ML IJ SOLN
1.0000 mg | Freq: Once | INTRAMUSCULAR | Status: AC
  Administered 2021-05-16: 1 mg via INTRAVENOUS
  Filled 2021-05-16: qty 2

## 2021-05-16 MED ORDER — LORAZEPAM 2 MG/ML IJ SOLN
INTRAMUSCULAR | Status: AC
  Administered 2021-05-16: 2 mg via INTRAVENOUS
  Filled 2021-05-16: qty 1

## 2021-05-16 MED ORDER — DROPERIDOL 2.5 MG/ML IJ SOLN
2.5000 mg | Freq: Once | INTRAMUSCULAR | Status: AC
  Administered 2021-05-16: 2.5 mg via INTRAVENOUS
  Filled 2021-05-16: qty 2

## 2021-05-16 NOTE — ED Provider Notes (Signed)
-----------------------------------------   11:00 PM on 05/16/2021 -----------------------------------------  Assuming care from Dr. Vladimir Crofts.  In short, Brianna Dodson is a 87 y.o. female with a chief complaint of altered mental status and combativeness.  Refer to the original H&P for additional details.  The current plan of care is to follow-up on head CT and reassess blood pressure.  Concern for acute encephalopathy of unclear cause, possibly hypertensive encephalopathy, delirium in the setting of chronic dementia also possible.  No bacteria seen on urinalysis, sending urine culture but no indication for emergent antibiotics.  Patient's mental status is very acutely different tonight compared to before according to daughter who is at bedside and with whom I spoke.  Patient required haloperidol 5 mg IM and Ativan 2 mg IM previously, and she is once again becoming agitated, so Dr. Tamala Julian ordered droperidol 2.5 mg IV and Versed 1 mg IV as calming agents to allow for CT scan.   ----------------------------------------- 1:29 AM on 05/17/2021 -----------------------------------------  No acute findings on head CT.  Patient still somewhat agitated but not as combative.  Ordered labetalol 10 mg IV for persistently elevated blood pressure which could be the cause of her encephalopathy.  Consult the hospitalist service and Dr. Damita Dunnings will admit.   Hinda Kehr, MD 05/17/21 0130

## 2021-05-16 NOTE — ED Provider Notes (Signed)
[facility] Provider Note ____________________________________________   Event Date/Time   First MD Initiated Contact with Patient 05/16/21 2122     (approximate)  I have reviewed the triage vital signs and the nursing notes.  HISTORY  Chief Complaint Altered Mental Status (EMS AMS/)   HPI Brianna Dodson is a 87 y.o. femalewho presents to the ED for evaluation of altered mentation.   Chart review indicates ESRD on MWF iHD, CKD, paroxysmal A. fib on Eliquis, DM, HTN.  Stroke recurrently,.  She presents from home via EMS for evaluation of altered mentation, agitation and combativeness.  EMS reports that they have been called out a few times over the past 3 days, but family never wanted transport until tonight.  Patient required 2.5 mg of IM Haldol with EMS due to agitation, swinging at the medics and speaking gibberish.  Reportedly compliant with dialysis  Here in the ED, patient is unable to provide any relevant history due to her agitation and confusion.  Swinging at staff and nonsensical speech.  Sister reports worsening confusion at nighttime only.  She is fine during the day, sister dropped her off and picked her up from dialysis today and she was normal.  Increasing confusion at night for the past few weeks.  Past Medical History:  Diagnosis Date   A-fib ([place])    Alzheimer disease ([place])    Anemia    Carotid stenosis    Cerebral infarction ([place]) 04/12/2019   Multiple new foci of acute infarction affect the RIGHT hemisphere affecting the frontal, posterior frontal, posterior temporal, anterior parietal cortex and regional white matter.   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/2019   RIGHT MCA distal M1   Chronic anticoagulation    Apixaban   CKD (chronic kidney disease), stage 4(HCC)    Embolic stroke involving cerebral artery ([place]) 03/15/2019   RIGHT MCA/PCA territory    Encephalomalacia    RIGHT posterior MCA territory   HCV (hepatitis C virus)    History of blood transfusion    Hyperlipidemia    Hypertension    OSA (obstructive sleep apnea)    Status post placement of implantable loop recorder 07/30/2019   T2DM (type 2 diabetes mellitus) ([place])     Patient Active Problem List   Diagnosis Date Noted   Carpal tunnel syndrome on right 04/12/2021   Edema 03/29/2021   Pressure injury of skin 03/18/2021   Syncope and collapse 03/16/2021   Chronic anticoagulation 03/16/2021   Hypotension 03/16/2021   Recurrent syncope 03/16/2021   Pressure injury of sacral region, stage 2 ([place]) 03/15/2021   Leakage of vascular dialysis catheter ([place]) 03/03/2021   Renal dialysis device, implant, or graft complication    Left arm pain 02/25/2021   Thrombocytopenia ([place]) 02/25/2021   Other disorders of phosphorus metabolism 01/12/2021   Coagulation defect, unspecified ([place]) 10/12/2020   Diarrhea, unspecified 10/12/2020   Encounter for immunization 10/12/2020   End stage renal disease on dialysis ([place]) 10/12/2020   Fever, unspecified 10/12/2020   Headache, unspecified 10/12/2020   Iron deficiency anemia, unspecified 10/12/2020   Localized edema 10/12/2020   Nontoxic single thyroid nodule 10/12/2020   Occlusion and stenosis of unspecified carotid artery 10/12/2020   Other fluid overload 10/12/2020   Pain, unspecified 10/12/2020   Shortness of breath 10/12/2020   Vascular dementia without behavioral disturbance ([place]) 10/12/2020   Malnutrition of moderate degree 09/13/2020   Anemia of chronic disease 09/10/2020   Sleeping difficulty  07/08/2020   Fatigue 06/09/2020   Mixed Alzheimer's and vascular dementia ([place]) 06/09/2020   Coccydynia 01/26/2020   Leukopenia 12/08/2019   Muscle strain 12/08/2019   Pedal edema 11/26/2019   Dysarthria and anarthria 11/07/2019   Atrial fibrillation ([place]) 10/21/2019   Nocturia 10/21/2019   Seborrheic keratosis 10/21/2019   Carotid  stenosis 08/27/2019   Thyroid nodule 08/27/2019   Status post placement of implantable loop recorder 08/07/2019   Anemia in ESRD (end-stage renal disease) ([place]) 04/18/2019   Aphasia 04/12/2019   Benign hypertensive renal disease 04/07/2019   Hyperparathyroidism due to renal insufficiency ([place]) 04/07/2019   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, or unspecified 04/07/2019   Proteinuria 04/07/2019   History of stroke with current residual effects 03/15/2019   Hyperlipidemia associated with type 2 diabetes mellitus ([place]) 07/25/2018   Itching 10/23/2017   Nevus 04/17/2017   CKD (chronic kidney disease), stage V ([place]) 02/02/2015   Vitamin D deficiency 02/02/2015   Obesity (BMI 30-39.9) 08/03/2014   Type II diabetes mellitus with renal manifestations ([place]) 01/29/2014   Essential hypertension 01/13/2014   HLD (hyperlipidemia) 01/13/2014    Past Surgical History:  Procedure Laterality Date   ABDOMINAL HYSTERECTOMY  07/04/1983   AV FISTULA PLACEMENT Left 12/02/2020   Procedure: INSERTION OF ARTERIOVENOUS (AV)  FISTULA  ARM ( BRACHIO- CEPHALIC);  Surgeon: Algernon Huxley, MD;  Location: [facility] ORS;  Service: Vascular;  Laterality: Left;   AV FISTULA PLACEMENT Left 02/24/2021   Procedure: INSERTION OF ARTERIOVENOUS (AV) GORE-TEX GRAFT ARM;  Surgeon: Algernon Huxley, MD;  Location: [facility] OR[facility];  Service: Vascular;  Laterality: Left;  Left brachial Ax graft   CATARACT EXTRACTION, BILATERAL Bilateral    DIALYSIS/PERMA CATHETER INSERTION N/A 10/06/2020   Procedure: DIALYSIS/PERMA CATHETER INSERTION;  Surgeon: Algernon Huxley, MD;  Location: [facility];  Service: Cardiovascular;  Laterality: N/A;   LIGATION OF ARTERIOVENOUS  FISTULA Left 02/26/2021   Procedure: LIGATION OF ARTERIOVENOUS  FISTULA;  Surgeon: Bertram Savin, MD;  Location: [facility];  Service: Vascular;  Laterality: Left;   LOOP RECORDER INSERTION N/A 07/30/2019   Procedure: LOOP RECORDER  INSERTION;  Surgeon: Isaias Cowman, MD;  Location: [facility];  Service: Cardiovascular;  Laterality: N/A;   TEE WITHOUT CARDIOVERSION N/A 04/14/2019   Procedure: TRANSESOPHAGEAL ECHOCARDIOGRAM (TEE);  Surgeon: Teodoro Spray, MD;  Location: [facility];  Service: Cardiovascular;  Laterality: N/A;    Prior to Admission medications   Medication Sig Start Date End Date Taking? Authorizing Provider  acetaminophen (TYLENOL) 325 MG tablet Take 2 tablets (650 mg total) by mouth every 6 (six) hours as needed for mild pain, fever or moderate pain. Patient taking differently: Take 1,000 mg by mouth every 8 (eight) hours as needed for mild pain, fever or moderate pain. 03/03/21   Florencia Reasons, MD  amLODipine (NORVASC) 5 MG tablet Take 1 tablet (5 mg total) by mouth daily. Hold if SBP less than 130 mmHg 03/18/21   Val Riles, MD  ascorbic acid (VITAMIN C) 500 MG tablet Take 1 tablet (500 mg total) by mouth daily. 03/04/21   Florencia Reasons, MD  atorvastatin (LIPITOR) 80 MG tablet Take 1 tablet (80 mg total) by mouth daily. 10/14/20   [name], MD  carvedilol (COREG) 6.25 MG tablet TAKE 1 TABLET BY MOUTH 2 TIMES DAILY WITH A MEAL. 04/05/21   [name], MD  doxycycline (VIBRAMYCIN) 100 MG capsule Take 1 capsule (100 mg total)  by mouth 2 (two) times daily. 03/11/21   Kris Hartmann, NP  ELIQUIS 2.5 MG TABS tablet Take 1 tablet (2.5 mg total) by mouth 2 (two) times daily. 10/07/20   [name], MD  ezetimibe (ZETIA) 10 MG tablet TAKE 1 TABLET BY MOUTH EVERY DAY 12/06/20   [name], MD  Ferrous Sulfate (IRON) 28 MG TABS Take 1 tablet by mouth daily in the afternoon.    [provider]  glucose blood (ACCU-CHEK GUIDE) test strip USE TO CHECK BLOOD SUGARS TWICE DAILY. E11.9 12/27/20   [name], MD  hydrALAZINE (APRESOLINE) 50 MG tablet Take 1 tablet (50 mg total) by mouth in the morning and at bedtime. Hold if SBP  less than 140 mmHg 03/18/21   Val Riles, MD   Lancets [facility] ULTRASOFT) lancets Use as instructed. Use patient insurance preferred brand. 12/27/20   [name], MD  linagliptin (TRADJENTA) 5 MG TABS tablet Take 1 tablet (5 mg total) by mouth daily. 12/23/20   [name], MD  liver oil-zinc oxide (DESITIN) 40 % ointment Apply topically as needed for irritation. 03/03/21   Florencia Reasons, MD  mirtazapine (REMERON) 7.5 MG tablet Take 7.5 mg by mouth at bedtime. 01/10/21   [provider]  multivitamin (RENA-VIT) TABS tablet Take 1 tablet by mouth at bedtime. 03/03/21   Florencia Reasons, MD  Nutritional Supplements (FEEDING SUPPLEMENT, NEPRO CARB STEADY,) LIQD Take 237 mLs by mouth 2 (two) times daily between meals. 03/04/21   Florencia Reasons, MD  pregabalin (LYRICA) 25 MG capsule Take 1 capsule (25 mg total) by mouth 3 (three) times a week. Take after dialysis. 04/13/21   [name], MD  senna-docusate (SENOKOT-S) 8.6-50 MG tablet Take 1 tablet by mouth at bedtime as needed for mild constipation. 03/03/21   Florencia Reasons, MD  Vitamin D, Ergocalciferol, (DRISDOL) 50000 units CAPS capsule Take 50,000 Units by mouth every 30 (thirty) days. 10/05/17   [provider]    Allergies Nsaids  Family History  Problem Relation Age of Onset   Stroke Mother    Diabetes Mother    Heart disease Father    Kidney disease Sister    Diabetes Sister    Kidney disease Brother    Heart disease Sister    Diabetes Sister    Diabetes Sister    Diabetes Sister    Kidney disease Sister    Heart disease Sister    Kidney disease Brother        kidney transplant   Early death Brother 50       Truck Accident - died   Heart disease Brother     Social History Social History   Tobacco Use   Smoking status: Never   Smokeless tobacco: Never  Vaping Use   Vaping Use: Never used  Substance Use Topics   Alcohol use: No   Drug use: No    Review of Systems  Unable to be accurately  assessed ____________________________________________   PHYSICAL EXAM:  VITAL SIGNS: Vitals:   05/16/21 2230 05/16/21 2245  BP: (!) 176/143   Pulse:    Resp:  19  Temp:    SpO2:       Constitutional: Alert .  Agitated and combative.  Repeatedly saying no and is swinging at staff Eyes: Conjunctivae are normal. PERRL. EOMI. Head: Atraumatic. Nose: No congestion/rhinnorhea. Mouth/Throat: Mucous membranes are dry.  Oropharynx non-erythematous. Neck: No stridor. No cervical spine tenderness to palpation. Cardiovascular: Normal rate,  regular rhythm. Grossly normal heart sounds.  Good peripheral circulation. Respiratory: Normal respiratory effort.  No retractions. Lungs CTAB. Gastrointestinal: Soft , nondistended, nontender to palpation. No CVA tenderness. Musculoskeletal: No lower extremity tenderness nor edema.  No joint effusions. No signs of acute trauma.. Tunneled dialysis catheter extending from the right chest, clean insertion site. Neurologic:   Moving all 4 and agitation without apparent deficit. Skin:  Skin is warm, dry and intact. No rash noted. Psychiatric: Mood and affect are normal. Speech and behavior are normal.  ____________________________________________   LABS (all labs ordered are listed, but only abnormal results are displayed)  Labs Reviewed  COMPREHENSIVE METABOLIC PANEL - Abnormal; Notable for the following components:      Result Value   Chloride 95 (*)    Glucose, Bld 195 (*)    Creatinine, Ser 2.54 (*)    Calcium 8.3 (*)    AST 45 (*)    GFR, Estimated 18 (*)    All other components within normal limits  BLOOD GAS, ARTERIAL - Abnormal; Notable for the following components:   pH, Arterial 7.53 (*)    Bicarbonate 35.1 (*)    Acid-Base Excess 11.2 (*)    All other components within normal limits  URINALYSIS, COMPLETE (UACMP) WITH MICROSCOPIC - Abnormal; Notable for the following components:   Color, Urine YELLOW (*)    APPearance CLOUDY (*)     Protein, ur 100 (*)    Leukocytes,Ua MODERATE (*)    All other components within normal limits  RESP PANEL BY RT-PCR (FLU A&B, COVID) ARPGX2  CBC WITH DIFFERENTIAL/PLATELET  CBC WITH DIFFERENTIAL/PLATELET   ____________________________________________  12 Lead EKG  Sinus rhythm, rate of 71 bpm.  Leftward axis.  Normal intervals.  QTc 457.  No evidence of acute ischemia. ____________________________________________  RADIOLOGY  ED MD interpretation:  CXR reviewed by me without evidence of acute cardiopulmonary pathology.   Official radiology report(s): DG Chest Portable 1 View  Result Date: 05/16/2021 CLINICAL DATA:  Altered mental status.  Dialysis patient. EXAM: PORTABLE CHEST 1 VIEW COMPARISON:  03/16/2021 FINDINGS: Right-sided dialysis catheter tip at the atrial caval junction. Loop recorder in the left chest wall. Chronic elevation of right hemidiaphragm. Minor adjacent atelectasis/scarring. No acute airspace disease. Stable heart size and mediastinal contours. No pulmonary edema, large pleural effusion or pneumothorax. Stable osseous structures. IMPRESSION: 1. No acute chest finding. 2. Chronic elevation of right hemidiaphragm with adjacent atelectasis/scarring. Electronically Signed   By: Keith Rake M.D.   On: 05/16/2021 21:54    ____________________________________________   PROCEDURES and INTERVENTIONS  Procedure(s) performed (including Critical Care):  .1-3 Lead EKG Interpretation Performed by: Vladimir Crofts, MD Authorized by: Vladimir Crofts, MD     Interpretation: normal     ECG rate:  70   ECG rate assessment: normal     Rhythm: sinus rhythm     Ectopy: none     Conduction: normal    Medications  droperidol (INAPSINE) 2.5 MG/ML injection 2.5 mg (has no administration in time range)  midazolam (VERSED) injection 1 mg (has no administration in time range)  LORazepam (ATIVAN) injection 2 mg (2 mg Intravenous Given 05/16/21 2122)  haloperidol lactate (HALDOL)  injection 2.5 mg (2.5 mg Intramuscular Given 05/16/21 2138)    ____________________________________________   MDM / ED COURSE   87 year old female with history of dementia presents to the ED from home for evaluation of worsening agitation, confusion and combativeness concerning for sundowning.  Mild hypertension is noted but vitals otherwise normal.  Blood work is unremarkable.  ABG reassuring.  UA without infectious features.  CXR without infiltrate.  No evidence of metabolic encephalopathy at this point.  Could be hypertensive urgency versus intracranial pathology versus sundowning.  Patient signed out to oncoming provider to follow-up on CT head and clinical reassessment  Clinical Course as of 05/16/21 2312  Mon May 16, 2021  2213 Reassessed.  Patient coming down after the medications.  Sister is at the bedside and provides some supplemental history. [DS]  2248 Reassessed.  Calmed down quite a bit now.  Resting with sister at the bedside. [DS]    Clinical Course User Index [DS] Vladimir Crofts, MD    ____________________________________________   FINAL CLINICAL IMPRESSION(S) / ED DIAGNOSES  Final diagnoses:  Confusion  Agitation     ED Discharge Orders     None        Haik Mahoney Tamala Julian   Note:  This document was prepared using Dragon voice recognition software and may include unintentional dictation errors.    Vladimir Crofts, MD 05/16/21 [phone]
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
## 2021-05-17 ENCOUNTER — Inpatient Hospital Stay: Payer: Medicare Other

## 2021-05-17 ENCOUNTER — Encounter (INDEPENDENT_AMBULATORY_CARE_PROVIDER_SITE_OTHER): Payer: Medicare Other | Admitting: Ophthalmology

## 2021-05-17 ENCOUNTER — Telehealth: Payer: Self-pay | Admitting: Family Medicine

## 2021-05-17 DIAGNOSIS — G4733 Obstructive sleep apnea (adult) (pediatric): Secondary | ICD-10-CM | POA: Diagnosis not present

## 2021-05-17 DIAGNOSIS — N186 End stage renal disease: Secondary | ICD-10-CM | POA: Diagnosis present

## 2021-05-17 DIAGNOSIS — I4891 Unspecified atrial fibrillation: Secondary | ICD-10-CM | POA: Diagnosis not present

## 2021-05-17 DIAGNOSIS — B957 Other staphylococcus as the cause of diseases classified elsewhere: Secondary | ICD-10-CM | POA: Diagnosis not present

## 2021-05-17 DIAGNOSIS — R569 Unspecified convulsions: Secondary | ICD-10-CM | POA: Diagnosis not present

## 2021-05-17 DIAGNOSIS — T80219A Unspecified infection due to central venous catheter, initial encounter: Secondary | ICD-10-CM | POA: Diagnosis not present

## 2021-05-17 DIAGNOSIS — E440 Moderate protein-calorie malnutrition: Secondary | ICD-10-CM | POA: Diagnosis present

## 2021-05-17 DIAGNOSIS — I69354 Hemiplegia and hemiparesis following cerebral infarction affecting left non-dominant side: Secondary | ICD-10-CM | POA: Diagnosis not present

## 2021-05-17 DIAGNOSIS — L89159 Pressure ulcer of sacral region, unspecified stage: Secondary | ICD-10-CM | POA: Diagnosis not present

## 2021-05-17 DIAGNOSIS — Z515 Encounter for palliative care: Secondary | ICD-10-CM | POA: Diagnosis not present

## 2021-05-17 DIAGNOSIS — Z8673 Personal history of transient ischemic attack (TIA), and cerebral infarction without residual deficits: Secondary | ICD-10-CM | POA: Diagnosis not present

## 2021-05-17 DIAGNOSIS — I69318 Other symptoms and signs involving cognitive functions following cerebral infarction: Secondary | ICD-10-CM | POA: Diagnosis not present

## 2021-05-17 DIAGNOSIS — F01518 Vascular dementia, unspecified severity, with other behavioral disturbance: Secondary | ICD-10-CM | POA: Diagnosis present

## 2021-05-17 DIAGNOSIS — Z789 Other specified health status: Secondary | ICD-10-CM | POA: Diagnosis not present

## 2021-05-17 DIAGNOSIS — Z20822 Contact with and (suspected) exposure to covid-19: Secondary | ICD-10-CM | POA: Diagnosis present

## 2021-05-17 DIAGNOSIS — F0150 Vascular dementia without behavioral disturbance: Secondary | ICD-10-CM | POA: Diagnosis not present

## 2021-05-17 DIAGNOSIS — I700 Atherosclerosis of aorta: Secondary | ICD-10-CM | POA: Diagnosis not present

## 2021-05-17 DIAGNOSIS — R7881 Bacteremia: Secondary | ICD-10-CM | POA: Diagnosis not present

## 2021-05-17 DIAGNOSIS — F02811 Dementia in other diseases classified elsewhere, unspecified severity, with agitation: Secondary | ICD-10-CM | POA: Diagnosis present

## 2021-05-17 DIAGNOSIS — E1151 Type 2 diabetes mellitus with diabetic peripheral angiopathy without gangrene: Secondary | ICD-10-CM | POA: Diagnosis not present

## 2021-05-17 DIAGNOSIS — J9811 Atelectasis: Secondary | ICD-10-CM | POA: Diagnosis not present

## 2021-05-17 DIAGNOSIS — D631 Anemia in chronic kidney disease: Secondary | ICD-10-CM | POA: Diagnosis present

## 2021-05-17 DIAGNOSIS — G928 Other toxic encephalopathy: Secondary | ICD-10-CM | POA: Diagnosis present

## 2021-05-17 DIAGNOSIS — F05 Delirium due to known physiological condition: Secondary | ICD-10-CM | POA: Diagnosis present

## 2021-05-17 DIAGNOSIS — R509 Fever, unspecified: Secondary | ICD-10-CM | POA: Diagnosis not present

## 2021-05-17 DIAGNOSIS — R404 Transient alteration of awareness: Secondary | ICD-10-CM | POA: Diagnosis not present

## 2021-05-17 DIAGNOSIS — F02818 Dementia in other diseases classified elsewhere, unspecified severity, with other behavioral disturbance: Secondary | ICD-10-CM | POA: Diagnosis present

## 2021-05-17 DIAGNOSIS — D696 Thrombocytopenia, unspecified: Secondary | ICD-10-CM | POA: Diagnosis present

## 2021-05-17 DIAGNOSIS — R410 Disorientation, unspecified: Secondary | ICD-10-CM | POA: Diagnosis not present

## 2021-05-17 DIAGNOSIS — E1122 Type 2 diabetes mellitus with diabetic chronic kidney disease: Secondary | ICD-10-CM | POA: Diagnosis present

## 2021-05-17 DIAGNOSIS — Z66 Do not resuscitate: Secondary | ICD-10-CM | POA: Diagnosis present

## 2021-05-17 DIAGNOSIS — Z7401 Bed confinement status: Secondary | ICD-10-CM | POA: Diagnosis not present

## 2021-05-17 DIAGNOSIS — Z992 Dependence on renal dialysis: Secondary | ICD-10-CM | POA: Diagnosis not present

## 2021-05-17 DIAGNOSIS — N2581 Secondary hyperparathyroidism of renal origin: Secondary | ICD-10-CM | POA: Diagnosis present

## 2021-05-17 DIAGNOSIS — G9341 Metabolic encephalopathy: Secondary | ICD-10-CM | POA: Diagnosis not present

## 2021-05-17 DIAGNOSIS — B182 Chronic viral hepatitis C: Secondary | ICD-10-CM | POA: Diagnosis present

## 2021-05-17 DIAGNOSIS — F0280 Dementia in other diseases classified elsewhere without behavioral disturbance: Secondary | ICD-10-CM | POA: Diagnosis not present

## 2021-05-17 DIAGNOSIS — G9340 Encephalopathy, unspecified: Secondary | ICD-10-CM | POA: Diagnosis not present

## 2021-05-17 DIAGNOSIS — R451 Restlessness and agitation: Secondary | ICD-10-CM | POA: Diagnosis present

## 2021-05-17 DIAGNOSIS — R4182 Altered mental status, unspecified: Secondary | ICD-10-CM | POA: Diagnosis not present

## 2021-05-17 DIAGNOSIS — Z6821 Body mass index (BMI) 21.0-21.9, adult: Secondary | ICD-10-CM | POA: Diagnosis not present

## 2021-05-17 DIAGNOSIS — I959 Hypotension, unspecified: Secondary | ICD-10-CM | POA: Diagnosis not present

## 2021-05-17 DIAGNOSIS — L89150 Pressure ulcer of sacral region, unstageable: Secondary | ICD-10-CM | POA: Diagnosis present

## 2021-05-17 DIAGNOSIS — G309 Alzheimer's disease, unspecified: Secondary | ICD-10-CM | POA: Diagnosis present

## 2021-05-17 DIAGNOSIS — I639 Cerebral infarction, unspecified: Secondary | ICD-10-CM | POA: Diagnosis not present

## 2021-05-17 DIAGNOSIS — Y848 Other medical procedures as the cause of abnormal reaction of the patient, or of later complication, without mention of misadventure at the time of the procedure: Secondary | ICD-10-CM | POA: Diagnosis not present

## 2021-05-17 DIAGNOSIS — I160 Hypertensive urgency: Secondary | ICD-10-CM

## 2021-05-17 DIAGNOSIS — I120 Hypertensive chronic kidney disease with stage 5 chronic kidney disease or end stage renal disease: Secondary | ICD-10-CM | POA: Diagnosis present

## 2021-05-17 DIAGNOSIS — I161 Hypertensive emergency: Secondary | ICD-10-CM | POA: Diagnosis present

## 2021-05-17 DIAGNOSIS — I10 Essential (primary) hypertension: Secondary | ICD-10-CM | POA: Diagnosis not present

## 2021-05-17 LAB — CBC WITH DIFFERENTIAL/PLATELET
Abs Immature Granulocytes: 0 K/uL (ref 0.00–0.07)
Basophils Absolute: 0 K/uL (ref 0.0–0.1)
Basophils Relative: 1 %
Eosinophils Absolute: 0.2 K/uL (ref 0.0–0.5)
Eosinophils Relative: 6 %
HCT: 40.4 % (ref 36.0–46.0)
Hemoglobin: 13.7 g/dL (ref 12.0–15.0)
Immature Granulocytes: 0 %
Lymphocytes Relative: 28 %
Lymphs Abs: 1 K/uL (ref 0.7–4.0)
MCH: 29.7 pg (ref 26.0–34.0)
MCHC: 33.9 g/dL (ref 30.0–36.0)
MCV: 87.6 fL (ref 80.0–100.0)
Monocytes Absolute: 0.4 K/uL (ref 0.1–1.0)
Monocytes Relative: 13 %
Neutro Abs: 1.8 K/uL (ref 1.7–7.7)
Neutrophils Relative %: 52 %
Platelets: 87 K/uL — ABNORMAL LOW (ref 150–400)
RBC: 4.61 MIL/uL (ref 3.87–5.11)
RDW: 13 % (ref 11.5–15.5)
Smear Review: NORMAL
WBC: 3.5 K/uL — ABNORMAL LOW (ref 4.0–10.5)
nRBC: 0 % (ref 0.0–0.2)

## 2021-05-17 LAB — CBG MONITORING, ED
Glucose-Capillary: 167 mg/dL — ABNORMAL HIGH (ref 70–99)
Glucose-Capillary: 148 mg/dL — ABNORMAL HIGH (ref 70–99)
Glucose-Capillary: 141 mg/dL — ABNORMAL HIGH (ref 70–99)
Glucose-Capillary: 189 mg/dL — ABNORMAL HIGH (ref 70–99)

## 2021-05-17 LAB — HEMOGLOBIN A1C
Hgb A1c MFr Bld: 5.5 % (ref 4.8–5.6)
Mean Plasma Glucose: 111.15 mg/dL

## 2021-05-17 LAB — GLUCOSE, CAPILLARY: Glucose-Capillary: 153 mg/dL — ABNORMAL HIGH (ref 70–99)

## 2021-05-17 MED ORDER — PREGABALIN 25 MG PO CAPS: 25.0000 mg | ORAL_CAPSULE | ORAL | Status: DC

## 2021-05-17 MED ORDER — ONDANSETRON HCL 4 MG/2ML IJ SOLN: 4.0000 mg | Freq: Four times a day (QID) | INTRAMUSCULAR | Status: DC | PRN

## 2021-05-17 MED ORDER — ACETAMINOPHEN 325 MG PO TABS
650.0000 mg | ORAL_TABLET | Freq: Four times a day (QID) | ORAL | Status: DC | PRN
  Administered 2021-05-21 – 2021-06-02 (×6): 650 mg via ORAL
  Filled 2021-05-17 (×5): qty 2

## 2021-05-17 MED ORDER — ASCORBIC ACID 500 MG PO TABS
500.0000 mg | ORAL_TABLET | Freq: Every day | ORAL | Status: DC
  Administered 2021-05-21 – 2021-05-27 (×7): 500 mg via ORAL
  Filled 2021-05-17 (×7): qty 1

## 2021-05-17 MED ORDER — ONDANSETRON HCL 4 MG PO TABS
4.0000 mg | ORAL_TABLET | Freq: Four times a day (QID) | ORAL | Status: DC | PRN
  Administered 2021-05-25: 4 mg via ORAL
  Filled 2021-05-17: qty 1

## 2021-05-17 MED ORDER — EZETIMIBE 10 MG PO TABS
10.0000 mg | ORAL_TABLET | Freq: Every day | ORAL | Status: DC
  Administered 2021-05-21 – 2021-06-02 (×11): 10 mg via ORAL
  Filled 2021-05-17 (×18): qty 1

## 2021-05-17 MED ORDER — APIXABAN 2.5 MG PO TABS
2.5000 mg | ORAL_TABLET | Freq: Two times a day (BID) | ORAL | Status: DC
  Administered 2021-05-21 – 2021-06-02 (×23): 2.5 mg via ORAL
  Filled 2021-05-17 (×25): qty 1

## 2021-05-17 MED ORDER — DROPERIDOL 2.5 MG/ML IJ SOLN
2.5000 mg | Freq: Once | INTRAMUSCULAR | Status: AC
  Administered 2021-05-17: 2.5 mg via INTRAVENOUS
  Filled 2021-05-17: qty 2

## 2021-05-17 MED ORDER — CLONIDINE HCL 0.1 MG/24HR TD PTWK
0.1000 mg | MEDICATED_PATCH | TRANSDERMAL | Status: DC
  Filled 2021-05-17: qty 1

## 2021-05-17 MED ORDER — RENA-VITE PO TABS
1.0000 | ORAL_TABLET | Freq: Every day | ORAL | Status: DC
  Administered 2021-05-21 – 2021-06-02 (×13): 1 via ORAL
  Filled 2021-05-17 (×14): qty 1

## 2021-05-17 MED ORDER — CARVEDILOL 6.25 MG PO TABS
6.2500 mg | ORAL_TABLET | Freq: Two times a day (BID) | ORAL | Status: DC
  Administered 2021-05-21 – 2021-06-02 (×17): 6.25 mg via ORAL
  Filled 2021-05-17 (×18): qty 1

## 2021-05-17 MED ORDER — INSULIN ASPART 100 UNIT/ML IJ SOLN
0.0000 [IU] | Freq: Three times a day (TID) | INTRAMUSCULAR | Status: DC
  Administered 2021-05-17: 2 [IU] via SUBCUTANEOUS
  Administered 2021-05-18 (×2): 1 [IU] via SUBCUTANEOUS
  Filled 2021-05-17 (×3): qty 1

## 2021-05-17 MED ORDER — MIRTAZAPINE 15 MG PO TABS: 7.5000 mg | ORAL_TABLET | Freq: Every day | ORAL | Status: DC

## 2021-05-17 MED ORDER — LABETALOL HCL 5 MG/ML IV SOLN
5.0000 mg | INTRAVENOUS | Status: DC | PRN
  Administered 2021-05-17 (×2): 5 mg via INTRAVENOUS

## 2021-05-17 MED ORDER — LINAGLIPTIN 5 MG PO TABS
5.0000 mg | ORAL_TABLET | Freq: Every day | ORAL | Status: DC
  Filled 2021-05-17 (×5): qty 1

## 2021-05-17 MED ORDER — AMLODIPINE BESYLATE 5 MG PO TABS: 5.0000 mg | ORAL_TABLET | Freq: Every day | ORAL | Status: DC

## 2021-05-17 MED ORDER — SENNOSIDES-DOCUSATE SODIUM 8.6-50 MG PO TABS: 1.0000 | ORAL_TABLET | Freq: Every evening | ORAL | Status: DC | PRN

## 2021-05-17 MED ORDER — CHLORHEXIDINE GLUCONATE CLOTH 2 % EX PADS
6.0000 | MEDICATED_PAD | Freq: Every day | CUTANEOUS | Status: DC
  Administered 2021-05-20 – 2021-06-02 (×12): 6 via TOPICAL

## 2021-05-17 MED ORDER — ATORVASTATIN CALCIUM 20 MG PO TABS
80.0000 mg | ORAL_TABLET | Freq: Every day | ORAL | Status: DC
  Administered 2021-05-21 – 2021-06-02 (×11): 80 mg via ORAL
  Filled 2021-05-17 (×11): qty 4

## 2021-05-17 MED ORDER — SODIUM CHLORIDE 0.9 % IV SOLN: INTRAVENOUS | Status: DC

## 2021-05-17 MED ORDER — LOSARTAN POTASSIUM 50 MG PO TABS
100.0000 mg | ORAL_TABLET | Freq: Every day | ORAL | Status: DC
  Administered 2021-05-21 – 2021-05-22 (×2): 100 mg via ORAL
  Filled 2021-05-17 (×2): qty 2

## 2021-05-17 MED ORDER — FERROUS GLUCONATE 324 (38 FE) MG PO TABS
324.0000 mg | ORAL_TABLET | Freq: Every day | ORAL | Status: DC
  Administered 2021-05-21 – 2021-06-02 (×9): 324 mg via ORAL
  Filled 2021-05-17 (×17): qty 1

## 2021-05-17 MED ORDER — HYDRALAZINE HCL 50 MG PO TABS: 50.0000 mg | ORAL_TABLET | Freq: Two times a day (BID) | ORAL | Status: DC

## 2021-05-17 MED ORDER — ACETAMINOPHEN 650 MG RE SUPP
650.0000 mg | Freq: Four times a day (QID) | RECTAL | Status: DC | PRN
  Filled 2021-05-17: qty 1

## 2021-05-17 MED ORDER — LABETALOL HCL 5 MG/ML IV SOLN
10.0000 mg | Freq: Once | INTRAVENOUS | Status: AC
  Administered 2021-05-17: 10 mg via INTRAVENOUS
  Filled 2021-05-17: qty 4

## 2021-05-17 MED ORDER — INSULIN ASPART 100 UNIT/ML IJ SOLN: 0.0000 [IU] | Freq: Every day | INTRAMUSCULAR | Status: DC

## 2021-05-17 MED ORDER — LABETALOL HCL 5 MG/ML IV SOLN
10.0000 mg | INTRAVENOUS | Status: DC | PRN
  Administered 2021-05-17 – 2021-05-18 (×3): 10 mg via INTRAVENOUS
  Filled 2021-05-17 (×3): qty 4

## 2021-05-17 MED ORDER — INSULIN ASPART 100 UNIT/ML IJ SOLN
0.0000 [IU] | INTRAMUSCULAR | Status: DC
  Administered 2021-05-17: 1 [IU] via SUBCUTANEOUS
  Filled 2021-05-17: qty 1

## 2021-05-17 NOTE — Progress Notes (Signed)
Patient in room resting with eyes closed. Sitter at bedside. Vitals taken. Patient noted to have right chest dialysis cath partially out. Sutures noted loose on chest as well. 4x4 gauze coming halfway off. Placed 2 drain sponges and taped over with paper tape. Patient has mitts on but is noted to move both arms around significantly when awake. No skin issues noted other than dryness and flakiness. Unable to answer any admission questions at this time due to altered mental status. Patient is not following directions at this time.

## 2021-05-17 NOTE — Telephone Encounter (Signed)
Noted.  Plan to await discharge from hospital prior to determining appropriate treatment.

## 2021-05-17 NOTE — ED Notes (Signed)
CBG was checked by tech/sitter. CBG was 141.

## 2021-05-17 NOTE — ED Notes (Signed)
CBG 189. 

## 2021-05-17 NOTE — Telephone Encounter (Signed)
Pts sister called in regards to pt being admitted into [facility] due to confusion. Pt sister states this is the second time it has happened and she was unable to get her under control. Sister states pt had to be sedated when admitted into ED. Sister states it only happens at night so far. Sister is wanting to know if pt could be put on medicine. Sister was advised we will wait until Dr. Caryl Bis receives further diagnosis to determine what the next steps are.  Chalsey Leeth,cma

## 2021-05-17 NOTE — Telephone Encounter (Signed)
Pts sister called in regards to pt being admitted into [facility] due to confusion. Pt sister states this is the second time it has happened and she was unable to get her under control. Sister states pt had to be sedated when admitted into ED. Sister states it only happens at night so far. Sister is wanting to know if pt could be put on medicine. Sister was advised we will wait until Dr. Caryl Bis receives further diagnosis to determine what the next steps are.

## 2021-05-17 NOTE — Progress Notes (Signed)
OT Cancellation Note  Patient Details Name: Brianna Dodson MRN: [id number] DOB: 1934-01-09   Cancelled Treatment:    Reason Eval/Treat Not Completed: Other (comment). Consult received, chart reviewed. Per RN, pt is very altered, combative, and not following commands at this time. Will hold OT evaluation today and re-attempt OT evaluation next date as appropriate.   Ardeth Perfect., MPH, MS, OTR/L ascom [phone] 05/17/21, 1:54 PM

## 2021-05-17 NOTE — Plan of Care (Signed)
  Problem: Health Behavior/Discharge Planning: Goal: Ability to manage health-related needs will improve Outcome: Progressing   Problem: Clinical Measurements: Goal: Will remain free from infection Outcome: Progressing   Problem: Clinical Measurements: Goal: Diagnostic test results will improve Outcome: Progressing   

## 2021-05-17 NOTE — ED Notes (Signed)
Lab at bedside to collect morning labs.

## 2021-05-17 NOTE — H&P (Signed)
History and Physical    Brianna Dodson NUU:[id number] DOB: [birth year] DOA: 05/16/2021  PCP: [name], MD   Patient coming from: home  I have personally briefly reviewed patient's relevant medical records in [place]  Chief Complaint: altered mental status  HPI: Brianna Dodson is a 87 y.o. female with medical history significant for ESRD on HD MWF, anemia of CKD, paroxysmal A. fib on Eliquis, DM, Alzheimer's disease, OSA, HTN, DM, recurrent strokes, who was brought in by EMS for agitation and altered mental status.  EMS had been called out to the home 3 times in the past few days for altered mental status but family declined transport to the emergency room.  Patient went to dialysis earlier in the day but became agitated on her arrival back home and this time family agreed for her to be brought in.  She was reportedly very agitated, striking at both family and EMS.  History is limited due to dementia and also due to sedation from medication administered in the ED for agitation    ED course: In the ED BP was as elevated as 176/143 with otherwise normal vitals Blood work notable for thrombocytopenia of 87,000 (baseline 130-170) but otherwise unremarkable Urinalysis with moderate leukocyte esterase  EKG, personally viewed and interpreted: Sinus at 71 with no acute ST-T wave changes  Imaging: Chest x-ray nonacute Head CT with chronic right MCA infarct chronic atrophic and ischemic changes.  Patient required multiple sedating agents in the ED including Haldol and droperidol  Hospitalist consulted for admission    Review of Systems: Unable to obtain due to sedation from medication given in the ED  Past Medical History:  Diagnosis Date   A-fib ([place])    Alzheimer disease ([place])    Anemia    Carotid stenosis    Cerebral infarction ([place]) 04/12/2019   Multiple new foci of acute infarction affect the RIGHT hemisphere affecting the frontal, posterior frontal, posterior  temporal, anterior parietal cortex and regional white matter.   Cerebral infarction due to embolism of right middle cerebral artery ([place]) 08/20/2019   RIGHT MCA distal M1   Chronic anticoagulation    Apixaban   CKD (chronic kidney disease), stage 4(HCC)    Embolic stroke involving cerebral artery ([place]) 03/15/2019   RIGHT MCA/PCA territory   Encephalomalacia    RIGHT posterior MCA territory   HCV (hepatitis C virus)    History of blood transfusion    Hyperlipidemia    Hypertension    OSA (obstructive sleep apnea)    Status post placement of implantable loop recorder 07/30/2019   T2DM (type 2 diabetes mellitus) [facility])     Past Surgical History:  Procedure Laterality Date   ABDOMINAL HYSTERECTOMY  07/04/1983   AV FISTULA PLACEMENT Left 12/02/2020   Procedure: INSERTION OF ARTERIOVENOUS (AV)  FISTULA  ARM ( BRACHIO- CEPHALIC);  Surgeon: Algernon Huxley, MD;  Location: [facility] ORS;  Service: Vascular;  Laterality: Left;   AV FISTULA PLACEMENT Left 02/24/2021   Procedure: INSERTION OF ARTERIOVENOUS (AV) GORE-TEX GRAFT ARM;  Surgeon: Algernon Huxley, MD;  Location: [facility] ORS;  Service: Vascular;  Laterality: Left;  Left brachial Ax graft   CATARACT EXTRACTION, BILATERAL Bilateral    DIALYSIS/PERMA CATHETER INSERTION N/A 10/06/2020   Procedure: DIALYSIS/PERMA CATHETER INSERTION;  Surgeon: Algernon Huxley, MD;  Location: [place] [facility];  Service: Cardiovascular;  Laterality: N/A;   LIGATION OF ARTERIOVENOUS  FISTULA Left 02/26/2021   Procedure: LIGATION OF ARTERIOVENOUS  FISTULA;  Surgeon: Lowella Grip,  Broadus John, MD;  Location: [facility] ORS;  Service: Vascular;  Laterality: Left;   LOOP RECORDER INSERTION N/A 07/30/2019   Procedure: LOOP RECORDER INSERTION;  Surgeon: Isaias Cowman, MD;  Location:  [place] [facility];  Service: Cardiovascular;  Laterality: N/A;   TEE WITHOUT CARDIOVERSION N/A 04/14/2019   Procedure: TRANSESOPHAGEAL ECHOCARDIOGRAM (TEE);  Surgeon: Teodoro Spray, MD;  Location: [facility] ORS;   Service: Cardiovascular;  Laterality: N/A;     reports that she has never smoked. She has never used smokeless tobacco. She reports that she does not drink alcohol and does not use drugs.  Allergies  Allergen Reactions   Nsaids     CKD stage III - Avoid all nephrotoxic drugs    Family History  Problem Relation Age of Onset   Stroke Mother    Diabetes Mother    Heart disease Father    Kidney disease Sister    Diabetes Sister    Kidney disease Brother    Heart disease Sister    Diabetes Sister    Diabetes Sister    Diabetes Sister    Kidney disease Sister    Heart disease Sister    Kidney disease Brother        kidney transplant   Early death Brother 63       Truck Accident - died   Heart disease Brother       Prior to Admission medications   Medication Sig Start Date End Date Taking? Authorizing Provider  acetaminophen (TYLENOL) 325 MG tablet Take 2 tablets (650 mg total) by mouth every 6 (six) hours as needed for mild pain, fever or moderate pain. Patient taking differently: Take 1,000 mg by mouth every 8 (eight) hours as needed for mild pain, fever or moderate pain. 03/03/21   Florencia Reasons, MD  amLODipine (NORVASC) 5 MG tablet Take 1 tablet (5 mg total) by mouth daily. Hold if SBP less than 130 mmHg 03/18/21   Val Riles, MD  ascorbic acid (VITAMIN C) 500 MG tablet Take 1 tablet (500 mg total) by mouth daily. 03/04/21   Florencia Reasons, MD  atorvastatin (LIPITOR) 80 MG tablet Take 1 tablet (80 mg total) by mouth daily. 10/14/20   [name], MD  carvedilol (COREG) 6.25 MG tablet TAKE 1 TABLET BY MOUTH 2 TIMES DAILY WITH A MEAL. 04/05/21   [name], MD  doxycycline (VIBRAMYCIN) 100 MG capsule Take 1 capsule (100 mg total) by mouth 2 (two) times daily. 03/11/21   Kris Hartmann, NP  ELIQUIS 2.5 MG TABS tablet Take 1 tablet (2.5 mg total) by mouth 2 (two) times daily. 10/07/20   [name], MD  ezetimibe (ZETIA) 10 MG tablet TAKE 1 TABLET BY MOUTH EVERY DAY 12/06/20    [name], MD  Ferrous Sulfate (IRON) 28 MG TABS Take 1 tablet by mouth daily in the afternoon.    [provider]  glucose blood (ACCU-CHEK GUIDE) test strip USE TO CHECK BLOOD SUGARS TWICE DAILY. E11.9 12/27/20   [name], MD  hydrALAZINE (APRESOLINE) 50 MG tablet Take 1 tablet (50 mg total) by mouth in the morning and at bedtime. Hold if SBP  less than 140 mmHg 03/18/21   Val Riles, MD  Lancets [facility] ULTRASOFT) lancets Use as instructed. Use patient insurance preferred brand. 12/27/20   [name], MD  linagliptin (TRADJENTA) 5 MG TABS tablet Take 1 tablet (5 mg total) by mouth daily. 12/23/20   [name], MD  liver oil-zinc  oxide (DESITIN) 40 % ointment Apply topically as needed for irritation. 03/03/21   Florencia Reasons, MD  mirtazapine (REMERON) 7.5 MG tablet Take 7.5 mg by mouth at bedtime. 01/10/21   [provider]  multivitamin (RENA-VIT) TABS tablet Take 1 tablet by mouth at bedtime. 03/03/21   Florencia Reasons, MD  Nutritional Supplements (FEEDING SUPPLEMENT, NEPRO CARB STEADY,) LIQD Take 237 mLs by mouth 2 (two) times daily between meals. 03/04/21   Florencia Reasons, MD  pregabalin (LYRICA) 25 MG capsule Take 1 capsule (25 mg total) by mouth 3 (three) times a week. Take after dialysis. 04/13/21   [name], MD  senna-docusate (SENOKOT-S) 8.6-50 MG tablet Take 1 tablet by mouth at bedtime as needed for mild constipation. 03/03/21   Florencia Reasons, MD  Vitamin D, Ergocalciferol, (DRISDOL) 50000 units CAPS capsule Take 50,000 Units by mouth every 30 (thirty) days. 10/05/17   [provider]    Physical Exam: Vitals:   05/16/21 2145 05/16/21 2200 05/16/21 2230 05/16/21 2245  BP: (!) 155/82 (!) 163/101 (!) 176/143   Pulse:      Resp:    19  Temp:      TempSrc:      SpO2:       Constitutional: Sedated. Not in any apparent distress HEENT:      Head: Normocephalic and atraumatic.         Eyes: PERLA, EOMI, Conjunctivae are normal. Sclera is  non-icteric.       Mouth/Throat: Mucous membranes are moist.       Neck: Supple with no signs of meningismus. Cardiovascular: Regular rate and rhythm. No murmurs, gallops, or rubs. 2+ symmetrical distal pulses are present . No JVD. No  LE edema Respiratory: Respiratory effort normal .Lungs sounds clear bilaterally. No wheezes, crackles, or rhonchi.  Gastrointestinal: Soft, non tender, non distended. Positive bowel sounds.  Genitourinary: No CVA tenderness. Musculoskeletal: Nontender with normal range of motion in all extremities. No cyanosis, or erythema of extremities. Neurologic:  Face is symmetric. Moving all extremities. No gross focal neurologic deficits . Skin: Skin is warm, dry.  No rash or ulcer Psychiatric: Unable to assess due to sedation  Labs on Admission: I have personally reviewed following labs and imaging studies  CBC: Recent Labs  Lab 05/16/21 2300  WBC 3.5*  NEUTROABS 1.8  HGB 13.7  HCT 40.4  MCV 87.6  PLT 87*   Basic Metabolic Panel: Recent Labs  Lab 05/16/21 2207  NA 136  K 4.6  CL 95*  CO2 32  GLUCOSE 195*  BUN 17  CREATININE 2.54*  CALCIUM 8.3*   GFR: CrCl cannot be calculated (Unknown ideal weight.). Liver Function Tests: Recent Labs  Lab 05/16/21 2207  AST 45*  ALT 22  ALKPHOS 66  BILITOT 1.1  PROT 7.9  ALBUMIN 3.8   No results for input(s): LIPASE, AMYLASE in the last 168 hours. No results for input(s): AMMONIA in the last 168 hours. Coagulation Profile: No results for input(s): INR, PROTIME in the last 168 hours. Cardiac Enzymes: No results for input(s): CKTOTAL, CKMB, CKMBINDEX, TROPONINI in the last 168 hours. BNP (last 3 results) No results for input(s): PROBNP in the last 8760 hours. HbA1C: No results for input(s): HGBA1C in the last 72 hours. CBG: No results for input(s): GLUCAP in the last 168 hours. Lipid Profile: No results for input(s): CHOL, HDL, LDLCALC, TRIG, CHOLHDL, LDLDIRECT in the last 72 hours. Thyroid  Function Tests: No results for input(s): TSH, T4TOTAL, FREET4, T3FREE, THYROIDAB in  the last 72 hours. Anemia Panel: No results for input(s): VITAMINB12, FOLATE, FERRITIN, TIBC, IRON, RETICCTPCT in the last 72 hours. Urine analysis:    Component Value Date/Time   COLORURINE YELLOW (A) 05/16/2021 2214   APPEARANCEUR CLOUDY (A) 05/16/2021 2214   APPEARANCEUR Clear 11/09/2013 1902   LABSPEC 1.013 05/16/2021 2214   LABSPEC 1.011 11/09/2013 1902   PHURINE 8.0 05/16/2021 2214   GLUCOSEU NEGATIVE 05/16/2021 2214   GLUCOSEU >=1000 (A) 01/06/2016 0923   HGBUR NEGATIVE 05/16/2021 2214   BILIRUBINUR NEGATIVE 05/16/2021 2214   BILIRUBINUR neg 06/09/2020 1247   BILIRUBINUR Negative 11/09/2013 1902   KETONESUR NEGATIVE 05/16/2021 2214   PROTEINUR 100 (A) 05/16/2021 2214   UROBILINOGEN 0.2 06/09/2020 1247   UROBILINOGEN 0.2 01/06/2016 0923   NITRITE NEGATIVE 05/16/2021 2214   LEUKOCYTESUR MODERATE (A) 05/16/2021 2214   LEUKOCYTESUR Trace 11/09/2013 1902    Radiological Exams on Admission: CT HEAD WO CONTRAST (5MM)  Result Date: 05/16/2021 CLINICAL DATA:  Multi mental status, initial encounter EXAM: CT HEAD WITHOUT CONTRAST TECHNIQUE: Contiguous axial images were obtained from the base of the skull through the vertex without intravenous contrast. COMPARISON:  03/17/2021 FINDINGS: Brain: Changes are again seen consistent with prior right MCA infarct. No acute hemorrhage, acute infarction or space-occupying mass lesion is seen. Mild atrophic changes are noted commenced with the patient's age. Chronic white matter ischemic changes are noted as well. Vascular: No hyperdense vessel or unexpected calcification. Skull: Normal. Negative for fracture or focal lesion. Sinuses/Orbits: No acute finding. Other: None. IMPRESSION: Chronic right MCA infarct. Chronic atrophic and ischemic changes. No acute abnormality noted. Electronically Signed   By: Inez Catalina M.D.   On: 05/16/2021 23:40   DG Chest Portable 1  View  Result Date: 05/16/2021 CLINICAL DATA:  Altered mental status.  Dialysis patient. EXAM: PORTABLE CHEST 1 VIEW COMPARISON:  03/16/2021 FINDINGS: Right-sided dialysis catheter tip at the atrial caval junction. Loop recorder in the left chest wall. Chronic elevation of right hemidiaphragm. Minor adjacent atelectasis/scarring. No acute airspace disease. Stable heart size and mediastinal contours. No pulmonary edema, large pleural effusion or pneumothorax. Stable osseous structures. IMPRESSION: 1. No acute chest finding. 2. Chronic elevation of right hemidiaphragm with adjacent atelectasis/scarring. Electronically Signed   By: Keith Rake M.D.   On: 05/16/2021 21:54    Assessment/Plan    Agitation, acute   Possible PRES -Suspect multifactorial - Differential includes PRES, delirium.  Lower suspicion for acute infection or stroke - Haldol as needed agitation - Neurologic checks - Fall and aspiration precautions - Keep n.p.o. for now - Treat potential etiologies as outlined below    Hypertensive urgency/emergency - BP 170s over 140s - IV labetalol as needed to goal SBP under 140 while n.p.o. - Resume amlodipine, carvedilol, hydralazine when less sedated  Thrombocytopenia, acute on chronic - Platelet count 87,000 - SCDs for DVT prophylaxis and holding Eliquis  Multiple strokes - Currently on Eliquis - Holding orals overnight due to patient being sedated - Resume atorvastatin when appropriate    Mixed Alzheimer's and vascular dementia  -To resume Remeron when appropriate    Atrial fibrillation (HCC)   Chronic anticoagulation - Holding Eliquis for thrombocytopenia. - Holding carvedilol overnight while n.p.o.    Type II diabetes mellitus with renal manifestations (HCC) - Sliding scale insulin coverage    End stage renal disease on dialysis  - Nephrology consult for continuation of dialysis MWF    DVT prophylaxis: SCD Code Status: full code  Family Communication:  none  Disposition Plan: Back to previous home environment Consults called: Nephrology Status:At the time of admission, it appears that the appropriate admission status for this patient is INPATIENT. This is judged to be reasonable and necessary in order to provide the required intensity of service to ensure the patient's safety given the presenting symptoms, physical exam findings, and initial radiographic and laboratory data in the context of their  Comorbid conditions.   Patient requires inpatient status due to high intensity of service, high risk for further deterioration and high frequency of surveillance required.   I certify that at the point of admission it is my clinical judgment that the patient will require inpatient hospital care spanning beyond [place] MD [facility]   05/17/2021, 1:27 AM

## 2021-05-17 NOTE — Progress Notes (Signed)
PT Cancellation Note  Patient Details Name: Chelisa Hennen MRN: [id number] DOB: 19-Jan-1934   Cancelled Treatment:    Reason Eval/Treat Not Completed: Medical issues which prohibited therapy: Pt's most recent BP 234/46 falling outside guidelines for participation with PT services, will attempt to see pt at a future date/time as medically appropriate.     Linus Salmons PT, DPT 05/17/21, 1:55 PM

## 2021-05-17 NOTE — ED Notes (Signed)
Pt to MRI.  Messaged pharmacy about missing clonidine.

## 2021-05-17 NOTE — ED Notes (Signed)
Dialysis catheter in R chest appears to be partially out. Noted by sitter. Pt has not been able to pull at lines/tubes today with mitts on but continues to be combative when awake. Currently resting and sleeping. Dr Posey Pronto informed of appearance of dialysis catheter.

## 2021-05-17 NOTE — ED Notes (Signed)
Reina RN aware of assigned bed 

## 2021-05-17 NOTE — Progress Notes (Signed)
[place] [facility] [place] NAME: Solene Hereford    MR#:  [id number]  DATE OF BIRTH:  Sep 13, 1933  SUBJECTIVE:   brought in with altered mental status and delirium with agitation. No family at bedside. Had a chance to speak with granddaughter on the phone. Per granddaughter patient was trying to remove clothes and became agitated after coming back from dialysis yesterday. She has dementia and has good days and not so good days. Brought by EMS. Currently lethargic unable to give any history review of systems. REVIEW OF SYSTEMS:   Review of Systems  Unable to perform ROS: Mental status change  Tolerating Diet: Tolerating PT:   DRUG ALLERGIES:   Allergies  Allergen Reactions   Nsaids     CKD stage III - Avoid all nephrotoxic drugs    VITALS:  Blood pressure (!) 165/69, pulse 71, temperature (!) 97.5 F (36.4 C), temperature source Axillary, resp. rate 15, height 5\' 5"  (1.651 m), weight 66 kg, SpO2 93 %.  PHYSICAL EXAMINATION:   Physical Examlimited due to AMS  GENERAL:  87 y.o.-year-old patient lying in the bed with no acute distress.  HEENT: Head atraumatic, normocephalic. Oropharynx and nasopharynx clear.  LUNGS: Normal breath sounds bilaterally, no wheezing, rales, rhonchi. No use of accessory muscles of respiration.  CARDIOVASCULAR: S1, S2 normal. No murmurs, rubs, or gallops.  ABDOMEN: Soft, nontender, nondistended.  EXTREMITIES: No cyanosis, clubbing or edema b/l.    NEUROLOGIC: nonfocal PSYCHIATRIC:  patient is lethargic SKIN: No obvious rash, lesion, or ulcer.   LABORATORY PANEL:  CBC Recent Labs  Lab 05/16/21 2300  WBC 3.5*  HGB 13.7  HCT 40.4  PLT 87*    Chemistries  Recent Labs  Lab 05/16/21 2207  NA 136  K 4.6  CL 95*  CO2 32  GLUCOSE 195*  BUN 17  CREATININE 2.54*  CALCIUM 8.3*  AST 45*  ALT 22  ALKPHOS 66  BILITOT 1.1   Cardiac Enzymes No results for input(s): TROPONINI in the last 168  hours. RADIOLOGY:  CT HEAD WO CONTRAST (5MM)  Result Date: 05/16/2021 CLINICAL DATA:  Multi mental status, initial encounter EXAM: CT HEAD WITHOUT CONTRAST TECHNIQUE: Contiguous axial images were obtained from the base of the skull through the vertex without intravenous contrast. COMPARISON:  03/17/2021 FINDINGS: Brain: Changes are again seen consistent with prior right MCA infarct. No acute hemorrhage, acute infarction or space-occupying mass lesion is seen. Mild atrophic changes are noted commenced with the patient's age. Chronic white matter ischemic changes are noted as well. Vascular: No hyperdense vessel or unexpected calcification. Skull: Normal. Negative for fracture or focal lesion. Sinuses/Orbits: No acute finding. Other: None. IMPRESSION: Chronic right MCA infarct. Chronic atrophic and ischemic changes. No acute abnormality noted. Electronically Signed   By: Inez Catalina M.D.   On: 05/16/2021 23:40   DG Chest Portable 1 View  Result Date: 05/16/2021 CLINICAL DATA:  Altered mental status.  Dialysis patient. EXAM: PORTABLE CHEST 1 VIEW COMPARISON:  03/16/2021 FINDINGS: Right-sided dialysis catheter tip at the atrial caval junction. Loop recorder in the left chest wall. Chronic elevation of right hemidiaphragm. Minor adjacent atelectasis/scarring. No acute airspace disease. Stable heart size and mediastinal contours. No pulmonary edema, large pleural effusion or pneumothorax. Stable osseous structures. IMPRESSION: 1. No acute chest finding. 2. Chronic elevation of right hemidiaphragm with adjacent atelectasis/scarring. Electronically Signed   By: Keith Rake M.D.   On: 05/16/2021 21:54   ASSESSMENT AND PLAN:   Vickii Chafe  Lysne is a 87 y.o. female with medical history significant for ESRD on HD MWF, anemia of CKD, paroxysmal A. fib on Eliquis, DM, Alzheimer's disease, OSA, HTN, DM, recurrent strokes, who was brought in by EMS for agitation and altered mental status.  EMS had been called out to  the home 3 times in the past few days for altered mental status but family declined transport to the emergency room.  Patient went to dialysis earlier in the day but became agitated on her arrival back home and this time family agreed for her to be brought in.    Chest x-ray nonacute Head CT with chronic right MCA infarct chronic atrophic and ischemic changes.  Acute encephalopathy suspected metabolic with malignant hypertension Alzheimer's dementia along with vascular dementia acute delirium-- unclear etiology -- IV PRN labetalol -- clonidine patch -- patient not awake enough to take her multiple PO blood pressure meds -- per family patient started getting agitated/delirious after she came back from dialysis yesterday -- in the ER patient was lethargic. Unable to hold any conversation. -- CT head negative for acute abnormality. Shows chronic stroke and chronic small vessel ischemic changes. -- Will try get MRI of the brain-- discussed with granddaughter on the phone -- PRN Haldol   end-stage renal disease on hemodialysis Monday Wednesday Friday -- nephrology consultation  type II diabetes with renal failure -- continue sliding scale insulin until patient is awake and PO intake improves -- A1c 5.5  history of CVA with vascular dementia -- will try obtain MRI of the brain  Hyperlipidemia -- resume statins once able to tolerate PO's   Procedures: Family communication :grand-dter Rica Mast Consults : nephrology CODE STATUS: full DVT Prophylaxis : heparin Level of care: Med-Surg Status is: Inpatient  Remains inpatient appropriate because: workup for metabolic encephalopathy        TOTAL TIME TAKING CARE OF THIS PATIENT: 30 minutes.  >50% time spent on counselling and coordination of care  Note: This dictation was prepared with Dragon dictation along with smaller phrase technology. Any transcriptional errors that result from this process are unintentional.  Fritzi Mandes  M.D    [facility]   CC: Primary care physician; [name], MD Patient ID: [name], female   DOB: [birth year], 87 y.o.   MRN: [id number]

## 2021-05-17 NOTE — Progress Notes (Signed)
[facility]  ROUNDING NOTE   Subjective:   [name] was admitted to [facility] on 05/16/2021 for Agitation [R45.1]  Last hemodialysis treatment was yesterday.   Patient was having intermittent episodes of confusion. Brought by EMS. IM haloperidol given due to violent agitation.   Blood pressures are extremely elevated.    Objective:  Vital signs in last 24 hours:  Temp:  [97.5 F (36.4 C)] 97.5 F (36.4 C) (11/14 2134) Pulse Rate:  [71-98] 72 (11/15 1130) Resp:  [10-27] 14 (11/15 1130) BP: (155-226)/(49-143) 225/59 (11/15 1130) SpO2:  [91 %-96 %] 91 % (11/15 1130)  Weight change:  There were no vitals filed for this visit.  Intake/Output: No intake/output data recorded.   Intake/Output this shift:  No intake/output data recorded.  Physical Exam: General: NAD, laying on bed  Head: Normocephalic, atraumatic. Moist oral mucosal membranes  Eyes: Anicteric, PERRL  Neck: Supple, trachea midline  Lungs:  Clear to auscultation  Heart: Regular rate and rhythm  Abdomen:  Soft, nontender,   Extremities:  No peripheral edema.  Neurologic: sedated  Skin: No lesions  Access: Right IJ permcath    Basic Metabolic Panel: Recent Labs  Lab 05/16/21 2207  NA 136  K 4.6  CL 95*  CO2 32  GLUCOSE 195*  BUN 17  CREATININE 2.54*  CALCIUM 8.3*    Liver Function Tests: Recent Labs  Lab 05/16/21 2207  AST 45*  ALT 22  ALKPHOS 66  BILITOT 1.1  PROT 7.9  ALBUMIN 3.8   No results for input(s): LIPASE, AMYLASE in the last 168 hours. No results for input(s): AMMONIA in the last 168 hours.  CBC: Recent Labs  Lab 05/16/21 2300  WBC 3.5*  NEUTROABS 1.8  HGB 13.7  HCT 40.4  MCV 87.6  PLT 87*    Cardiac Enzymes: No results for input(s): CKTOTAL, CKMB, CKMBINDEX, TROPONINI in the last 168 hours.  BNP: Invalid input(s): POCBNP  CBG: Recent Labs  Lab 05/17/21 0330 05/17/21 0750  GLUCAP 167* 148*    Microbiology: Results for orders placed  or performed during the hospital encounter of 05/16/21  Resp Panel by RT-PCR (Flu A&B, Covid) Nasopharyngeal Swab     Status: None   Collection Time: 05/16/21 10:07 PM   Specimen: Nasopharyngeal Swab; Nasopharyngeal(NP) swabs in vial transport medium  Result Value Ref Range Status   SARS Coronavirus 2 by RT PCR NEGATIVE NEGATIVE Final    Comment: (NOTE) SARS-CoV-2 target nucleic acids are NOT DETECTED.  The SARS-CoV-2 RNA is generally detectable in upper respiratory specimens during the acute phase of infection. The lowest concentration of SARS-CoV-2 viral copies this assay can detect is 138 copies/mL. A negative result does not preclude SARS-Cov-2 infection and should not be used as the sole basis for treatment or other patient management decisions. A negative result may occur with  improper specimen collection/handling, submission of specimen other than nasopharyngeal swab, presence of viral mutation(s) within the areas targeted by this assay, and inadequate number of viral copies(<138 copies/mL). A negative result must be combined with clinical observations, patient history, and epidemiological information. The expected result is Negative.  Fact Sheet for Patients:  EntrepreneurPulse.com.au  Fact Sheet for Healthcare Providers:  IncredibleEmployment.be  This test is no t yet approved or cleared by the [place] FDA and  has been authorized for detection and/or diagnosis of SARS-CoV-2 by FDA under an Emergency Use Authorization (EUA). This EUA will remain  in effect (meaning this test can be used) for the  duration of the COVID-19 declaration under Section 564(b)(1) of the Act, 21 U.S.C.section 360bbb-3(b)(1), unless the authorization is terminated  or revoked sooner.       Influenza A by PCR NEGATIVE NEGATIVE Final   Influenza B by PCR NEGATIVE NEGATIVE Final    Comment: (NOTE) The Xpert Xpress SARS-CoV-2/FLU/RSV plus assay is intended  as an aid in the diagnosis of influenza from Nasopharyngeal swab specimens and should not be used as a sole basis for treatment. Nasal washings and aspirates are unacceptable for Xpert Xpress SARS-CoV-2/FLU/RSV testing.  Fact Sheet for Patients: EntrepreneurPulse.com.au  Fact Sheet for Healthcare Providers: IncredibleEmployment.be  This test is not yet approved or cleared by the [place] FDA and has been authorized for detection and/or diagnosis of SARS-CoV-2 by FDA under an Emergency Use Authorization (EUA). This EUA will remain in effect (meaning this test can be used) for the duration of the COVID-19 declaration under Section 564(b)(1) of the Act, 21 U.S.C. section 360bbb-3(b)(1), unless the authorization is terminated or revoked.  Performed at [facility], [address] [place], [place] [address]     Coagulation Studies: No results for input(s): LABPROT, INR in the last 72 hours.  Urinalysis: Recent Labs    05/16/21 2214  [place] 1.013  PHURINE 8.0  GLUCOSEU NEGATIVE  HGBUR NEGATIVE  BILIRUBINUR NEGATIVE  KETONESUR NEGATIVE  PROTEINUR 100*  NITRITE NEGATIVE  LEUKOCYTESUR MODERATE*      Imaging: CT HEAD WO CONTRAST (5MM)  Result Date: 05/16/2021 CLINICAL DATA:  Multi mental status, initial encounter EXAM: CT HEAD WITHOUT CONTRAST TECHNIQUE: Contiguous axial images were obtained from the base of the skull through the vertex without intravenous contrast. COMPARISON:  03/17/2021 FINDINGS: Brain: Changes are again seen consistent with prior right MCA infarct. No acute hemorrhage, acute infarction or space-occupying mass lesion is seen. Mild atrophic changes are noted commenced with the patient's age. Chronic white matter ischemic changes are noted as well. Vascular: No hyperdense vessel or unexpected calcification. Skull: Normal. Negative for fracture or focal lesion. Sinuses/Orbits: No acute  finding. Other: None. IMPRESSION: Chronic right MCA infarct. Chronic atrophic and ischemic changes. No acute abnormality noted. Electronically Signed   By: Inez Catalina M.D.   On: 05/16/2021 23:40   DG Chest Portable 1 View  Result Date: 05/16/2021 CLINICAL DATA:  Altered mental status.  Dialysis patient. EXAM: PORTABLE CHEST 1 VIEW COMPARISON:  03/16/2021 FINDINGS: Right-sided dialysis catheter tip at the atrial caval junction. Loop recorder in the left chest wall. Chronic elevation of right hemidiaphragm. Minor adjacent atelectasis/scarring. No acute airspace disease. Stable heart size and mediastinal contours. No pulmonary edema, large pleural effusion or pneumothorax. Stable osseous structures. IMPRESSION: 1. No acute chest finding. 2. Chronic elevation of right hemidiaphragm with adjacent atelectasis/scarring. Electronically Signed   By: Keith Rake M.D.   On: 05/16/2021 21:54     Medications:     amLODipine  5 mg Oral Daily   apixaban  2.5 mg Oral BID   ascorbic acid  500 mg Oral Daily   atorvastatin  80 mg Oral Daily   carvedilol  6.25 mg Oral BID WC   ezetimibe  10 mg Oral Daily   hydrALAZINE  50 mg Oral BID   insulin aspart  0-5 Units Subcutaneous QHS   insulin aspart  0-9 Units Subcutaneous TID WC   Iron  1 tablet Oral Q1500   linagliptin  5 mg Oral Daily   losartan  100 mg Oral Daily   mirtazapine  7.5  mg Oral QHS   multivitamin  1 tablet Oral QHS   [START ON 05/18/2021] pregabalin  25 mg Oral Once per day on Mon Wed Fri   acetaminophen **OR** acetaminophen, labetalol, ondansetron **OR** ondansetron (ZOFRAN) IV, senna-docusate  Assessment/ Plan:  [name] is a 87 y.o. black female with end stage renal disease on hemodialysis, hypertension, CVA, vascular dementia, hyperlipidemia, diabetes mellitus type II, atrial fibrillation who is admitted to [facility] on 05/16/2021 for Agitation [R45.1]   [facility] MWF [facility] RIJ permcath 65.5kg   End Stage Renal  Disease: completed hemodialysis treatment yesterday. Plan on dialysis tomorrow and to continue MWF schedule.    Altered mental status: with concerns of PRES, ischemia or medication related.  - discontinue pregablin.    Hypertension: with hypertension urgency. Home regimen includes losartan, amlodipine, carvedilol, hydralazine.  - restarted on home regimen - IV labatelol PRN    Anemia of chronic kidney disease: hemoglobin 13.7. Normocytic. Mircera as outpatient. Outpatient iron levels at goal on 9/7.     Secondary Hyperparathyroidism: PTH, calcium and phosphorus at goal on 11/2. Not currently on binders.    LOS: 0 Janie Capp 11/15/202212:35 PM

## 2021-05-17 NOTE — ED Notes (Addendum)
Pt brought back from MRI. MRI was not able to get scan due to Pt being agitated. Unable to obtain VS at this time will get them at a later time RN aware. Pt is now resting with no needs.

## 2021-05-18 ENCOUNTER — Inpatient Hospital Stay: Payer: Medicare Other

## 2021-05-18 DIAGNOSIS — R451 Restlessness and agitation: Secondary | ICD-10-CM | POA: Diagnosis not present

## 2021-05-18 LAB — RENAL FUNCTION PANEL
Albumin: 3.4 g/dL — ABNORMAL LOW (ref 3.5–5.0)
Anion gap: 9 (ref 5–15)
BUN: 35 mg/dL — ABNORMAL HIGH (ref 8–23)
CO2: 30 mmol/L (ref 22–32)
Calcium: 9.3 mg/dL (ref 8.9–10.3)
Chloride: 101 mmol/L (ref 98–111)
Creatinine, Ser: 3.82 mg/dL — ABNORMAL HIGH (ref 0.44–1.00)
GFR, Estimated: 11 mL/min — ABNORMAL LOW (ref >60)
Glucose, Bld: 133 mg/dL — ABNORMAL HIGH (ref 70–99)
Phosphorus: 5.4 mg/dL — ABNORMAL HIGH (ref 2.5–4.6)
Potassium: 4.7 mmol/L (ref 3.5–5.1)
Sodium: 140 mmol/L (ref 135–145)

## 2021-05-18 LAB — GLUCOSE, CAPILLARY
Glucose-Capillary: 178 mg/dL — ABNORMAL HIGH (ref 70–99)
Glucose-Capillary: 138 mg/dL — ABNORMAL HIGH (ref 70–99)
Glucose-Capillary: 111 mg/dL — ABNORMAL HIGH (ref 70–99)

## 2021-05-18 LAB — CBC
HCT: 40.2 % (ref 36.0–46.0)
Hemoglobin: 13.6 g/dL (ref 12.0–15.0)
MCH: 29.2 pg (ref 26.0–34.0)
MCHC: 33.8 g/dL (ref 30.0–36.0)
MCV: 86.3 fL (ref 80.0–100.0)
Platelets: 99 10*3/uL — ABNORMAL LOW (ref 150–400)
RBC: 4.66 MIL/uL (ref 3.87–5.11)
RDW: 13.1 % (ref 11.5–15.5)
WBC: 5.7 10*3/uL (ref 4.0–10.5)
nRBC: 0 % (ref 0.0–0.2)

## 2021-05-18 LAB — HEPATITIS B SURFACE ANTIBODY,QUALITATIVE: Hep B S Ab: REACTIVE — AB

## 2021-05-18 LAB — HEPATITIS B SURFACE ANTIGEN: Hepatitis B Surface Ag: NONREACTIVE

## 2021-05-18 MED ORDER — LABETALOL HCL 5 MG/ML IV SOLN
INTRAVENOUS | Status: AC
  Administered 2021-05-18: 20 mg via INTRAVENOUS
  Filled 2021-05-18: qty 4

## 2021-05-18 MED ORDER — HEPARIN SODIUM (PORCINE) 1000 UNIT/ML IJ SOLN
INTRAMUSCULAR | Status: AC
  Filled 2021-05-18: qty 1

## 2021-05-18 MED ORDER — LORAZEPAM 2 MG/ML IJ SOLN
1.0000 mg | Freq: Once | INTRAMUSCULAR | Status: AC
  Administered 2021-05-18: 1 mg via INTRAVENOUS
  Filled 2021-05-18: qty 1

## 2021-05-18 MED ORDER — CHLORHEXIDINE GLUCONATE CLOTH 2 % EX PADS
6.0000 | MEDICATED_PAD | Freq: Every day | CUTANEOUS | Status: DC
  Administered 2021-05-19 – 2021-06-03 (×13): 6 via TOPICAL

## 2021-05-18 MED ORDER — LORAZEPAM 2 MG/ML IJ SOLN: 2.0000 mg | Freq: Once | INTRAMUSCULAR | Status: DC

## 2021-05-18 NOTE — Progress Notes (Signed)
[facility]  ROUNDING NOTE   Subjective:   [name] was admitted to [facility] on 05/16/2021 for Confusion [R41.0] Agitation [R45.1]  Sitter was bedside.   Holding dialysis.   Not taking PO medications   Objective:  Vital signs in last 24 hours:  Temp:  [98.5 F (36.9 C)-99.5 F (37.5 C)] 99.3 F (37.4 C) (11/16 0748) Pulse Rate:  [50-88] 50 (11/16 0748) Resp:  [9-21] 16 (11/16 0748) BP: (120-189)/(60-127) 189/65 (11/16 0748) SpO2:  [89 %-100 %] 100 % (11/16 0748)  Weight change:  Filed Weights   05/17/21 1251  Weight: 66 kg    Intake/Output: No intake/output data recorded.   Intake/Output this shift:  No intake/output data recorded.  Physical Exam: General: Laying in bed  Head: Normocephalic, atraumatic. Moist oral mucosal membranes  Eyes: Anicteric, PERRL  Neck: Supple, trachea midline  Lungs:  Clear to auscultation  Heart: Regular rate and rhythm  Abdomen:  Soft, nontender,   Extremities:  No peripheral edema.  Neurologic: Somnolent.   Skin: No lesions  Access: Right IJ permcath    Basic Metabolic Panel: Recent Labs  Lab 05/16/21 2207  NA 136  K 4.6  CL 95*  CO2 32  GLUCOSE 195*  BUN 17  CREATININE 2.54*  CALCIUM 8.3*     Liver Function Tests: Recent Labs  Lab 05/16/21 2207  AST 45*  ALT 22  ALKPHOS 66  BILITOT 1.1  PROT 7.9  ALBUMIN 3.8    No results for input(s): LIPASE, AMYLASE in the last 168 hours. No results for input(s): AMMONIA in the last 168 hours.  CBC: Recent Labs  Lab 05/16/21 2300  WBC 3.5*  NEUTROABS 1.8  HGB 13.7  HCT 40.4  MCV 87.6  PLT 87*     Cardiac Enzymes: No results for input(s): CKTOTAL, CKMB, CKMBINDEX, TROPONINI in the last 168 hours.  BNP: Invalid input(s): POCBNP  CBG: Recent Labs  Lab 05/17/21 1225 05/17/21 1642 05/17/21 2248 05/18/21 0835 05/18/21 1140  GLUCAP 141* 189* 153* 178* 138*     Microbiology: Results for orders placed or performed during the  hospital encounter of 05/16/21  Resp Panel by RT-PCR (Flu A&B, Covid) Nasopharyngeal Swab     Status: None   Collection Time: 05/16/21 10:07 PM   Specimen: Nasopharyngeal Swab; Nasopharyngeal(NP) swabs in vial transport medium  Result Value Ref Range Status   SARS Coronavirus 2 by RT PCR NEGATIVE NEGATIVE Final    Comment: (NOTE) SARS-CoV-2 target nucleic acids are NOT DETECTED.  The SARS-CoV-2 RNA is generally detectable in upper respiratory specimens during the acute phase of infection. The lowest concentration of SARS-CoV-2 viral copies this assay can detect is 138 copies/mL. A negative result does not preclude SARS-Cov-2 infection and should not be used as the sole basis for treatment or other patient management decisions. A negative result may occur with  improper specimen collection/handling, submission of specimen other than nasopharyngeal swab, presence of viral mutation(s) within the areas targeted by this assay, and inadequate number of viral copies(<138 copies/mL). A negative result must be combined with clinical observations, patient history, and epidemiological information. The expected result is Negative.  Fact Sheet for Patients:  EntrepreneurPulse.com.au  Fact Sheet for Healthcare Providers:  IncredibleEmployment.be  This test is no t yet approved or cleared by the [place] FDA and  has been authorized for detection and/or diagnosis of SARS-CoV-2 by FDA under an Emergency Use Authorization (EUA). This EUA will remain  in effect (meaning this test can be  used) for the duration of the COVID-19 declaration under Section 564(b)(1) of the Act, 21 U.S.C.section 360bbb-3(b)(1), unless the authorization is terminated  or revoked sooner.       Influenza A by PCR NEGATIVE NEGATIVE Final   Influenza B by PCR NEGATIVE NEGATIVE Final    Comment: (NOTE) The Xpert Xpress SARS-CoV-2/FLU/RSV plus assay is intended as an aid in the  diagnosis of influenza from Nasopharyngeal swab specimens and should not be used as a sole basis for treatment. Nasal washings and aspirates are unacceptable for Xpert Xpress SARS-CoV-2/FLU/RSV testing.  Fact Sheet for Patients: EntrepreneurPulse.com.au  Fact Sheet for Healthcare Providers: IncredibleEmployment.be  This test is not yet approved or cleared by the [place] FDA and has been authorized for detection and/or diagnosis of SARS-CoV-2 by FDA under an Emergency Use Authorization (EUA). This EUA will remain in effect (meaning this test can be used) for the duration of the COVID-19 declaration under Section 564(b)(1) of the Act, 21 U.S.C. section 360bbb-3(b)(1), unless the authorization is terminated or revoked.  Performed at [facility], [address] [place], [place] [address]   Urine Culture     Status: None (Preliminary result)   Collection Time: 05/16/21 10:07 PM   Specimen: In/Out Cath Urine  Result Value Ref Range Status   Specimen Description   Final    IN/OUT CATH URINE Performed at [facility], [address], [place], [place] [address]    Special Requests   Final    NONE Performed at [facility], [address], [place], [place] [address]    Culture   Final    CULTURE REINCUBATED FOR BETTER GROWTH Performed at [facility], [place] [address] [place], [place] [address]    Report Status PENDING  Incomplete    Coagulation Studies: No results for input(s): LABPROT, INR in the last 72 hours.  Urinalysis: Recent Labs    05/16/21 2214  [place] 1.013  PHURINE 8.0  GLUCOSEU NEGATIVE  HGBUR NEGATIVE  BILIRUBINUR NEGATIVE  KETONESUR NEGATIVE  PROTEINUR 100*  NITRITE NEGATIVE  LEUKOCYTESUR MODERATE*       Imaging: CT HEAD WO CONTRAST (5MM)  Result Date: 05/16/2021 CLINICAL DATA:  Multi mental status, initial encounter EXAM: CT HEAD WITHOUT CONTRAST  TECHNIQUE: Contiguous axial images were obtained from the base of the skull through the vertex without intravenous contrast. COMPARISON:  03/17/2021 FINDINGS: Brain: Changes are again seen consistent with prior right MCA infarct. No acute hemorrhage, acute infarction or space-occupying mass lesion is seen. Mild atrophic changes are noted commenced with the patient's age. Chronic white matter ischemic changes are noted as well. Vascular: No hyperdense vessel or unexpected calcification. Skull: Normal. Negative for fracture or focal lesion. Sinuses/Orbits: No acute finding. Other: None. IMPRESSION: Chronic right MCA infarct. Chronic atrophic and ischemic changes. No acute abnormality noted. Electronically Signed   By: Inez Catalina M.D.   On: 05/16/2021 23:40   MR BRAIN WO CONTRAST  Result Date: 05/18/2021 CLINICAL DATA:  Delirium EXAM: MRI HEAD WITHOUT CONTRAST TECHNIQUE: Multiplanar, multiecho pulse sequences of the brain and surrounding structures were obtained without intravenous contrast. COMPARISON:  03/17/2021 FINDINGS: Brain: There is no acute infarction or intracranial hemorrhage. There is no intracranial mass, mass effect, or edema. There is no hydrocephalus or extra-axial fluid collection. Large chronic right MCA territory infarction with chronic blood products primarily involving frontoparietal lobes. Small chronic right cerebellar infarct. Other patchy and confluent areas of T2 hyperintensity in the supratentorial and pontine  white matter nonspecific but probably reflect stable chronic microvascular ischemic changes. Scattered foci of susceptibility primarily involving deep gray nuclei likely reflect chronic microhemorrhages secondary to hypertension. Prominence of the ventricles and sulci reflects stable parenchymal volume loss. Vascular: Major vessel flow voids at the skull base are preserved. Skull and upper cervical spine: Normal marrow signal is preserved. Sinuses/Orbits: Minor mucosal  thickening. Bilateral lens replacement. Other: Sella is unremarkable. Minimal patchy mastoid fluid opacification. IMPRESSION: No acute infarction, hemorrhage, or mass. Chronic/nonemergent findings detailed above are unchanged from prior MRI. Electronically Signed   By: Macy Mis M.D.   On: 05/18/2021 11:03   DG Chest Portable 1 View  Result Date: 05/16/2021 CLINICAL DATA:  Altered mental status.  Dialysis patient. EXAM: PORTABLE CHEST 1 VIEW COMPARISON:  03/16/2021 FINDINGS: Right-sided dialysis catheter tip at the atrial caval junction. Loop recorder in the left chest wall. Chronic elevation of right hemidiaphragm. Minor adjacent atelectasis/scarring. No acute airspace disease. Stable heart size and mediastinal contours. No pulmonary edema, large pleural effusion or pneumothorax. Stable osseous structures. IMPRESSION: 1. No acute chest finding. 2. Chronic elevation of right hemidiaphragm with adjacent atelectasis/scarring. Electronically Signed   By: Keith Rake M.D.   On: 05/16/2021 21:54     Medications:     amLODipine  5 mg Oral Daily   apixaban  2.5 mg Oral BID   ascorbic acid  500 mg Oral Daily   atorvastatin  80 mg Oral Daily   carvedilol  6.25 mg Oral BID WC   Chlorhexidine Gluconate Cloth  6 each Topical Daily   cloNIDine  0.1 mg Transdermal Weekly   ezetimibe  10 mg Oral Daily   ferrous gluconate  324 mg Oral Q1500   hydrALAZINE  50 mg Oral BID   insulin aspart  0-5 Units Subcutaneous QHS   insulin aspart  0-9 Units Subcutaneous TID WC   linagliptin  5 mg Oral Daily   losartan  100 mg Oral Daily   mirtazapine  7.5 mg Oral QHS   multivitamin  1 tablet Oral QHS   acetaminophen **OR** acetaminophen, labetalol, ondansetron **OR** ondansetron (ZOFRAN) IV, senna-docusate  Assessment/ Plan:  [name] is a 87 y.o. black female with end stage renal disease on hemodialysis, hypertension, CVA, vascular dementia, hyperlipidemia, diabetes mellitus type II, atrial  fibrillation who is admitted to [facility] on 05/16/2021 for Confusion [R41.0] Agitation [R45.1]   [facility] MWF [facility] RIJ permcath 65.5kg   End Stage Renal Disease:  - schedule dialysis for today.    Altered mental status: with concerns of PRES, ischemia or medication related.  - discontinue pregablin.  - will see if dialysis will help with her mental status.    Hypertension: with hypertension urgency. Home regimen includes losartan, amlodipine, carvedilol, hydralazine.  - IV labatelol PRN    Anemia of chronic kidney disease: hemoglobin 13.7. Normocytic. Mircera as outpatient. Outpatient iron levels at goal on 9/7.     Secondary Hyperparathyroidism: PTH, calcium and phosphorus at goal on 11/2. Not currently on binders.    LOS: 1 Brianna Dodson 11/16/20222:15 PM

## 2021-05-18 NOTE — Progress Notes (Addendum)
Patient awakens throwing her legs over the side rails. Currently has a Air cabin crew at bedside. Patient being combative. Patient adult brief changed x 2 assist due to patient hitting out. Secure chat sent to NP on call.   26- New order noted.  0155- Patient given one dose of ativan 1mg , patient calmed down immediately after administration.   42- Remains in bed with eyes closed. Resp even and unlabored. Remains with Air cabin crew.  0630-  placed order for IV team to come and assess subclavian HD cath to patient's right chest. Currently has 2 drain sponges placed on top to keep in place. Sutures not intact.   0657- MRI called for update. Says to pass on to day shift that they will come to get her at 1030 and see if we can have an order for (possibly ativan) as was given at 0145 and was very effective for patient. - to give the medicine when transport is here to transfer her to MRI.

## 2021-05-18 NOTE — TOC Initial Note (Signed)
Transition of Care [facility]) - Initial/Assessment Note    Patient Details  Name: Brianna Dodson MRN: [id number] Date of Birth: [birth year]  Transition of Care [facility]) CM/SW Contact:    Candie Chroman, LCSW Phone Number: 05/18/2021, 10:14 AM  Clinical Narrative: Readmission prevention screen complete. Patient is not oriented. Called patient's sister, introduced role, and explained that discharge planning would be discussed. Patient lives home with her husband and granddaughter. Sister assists with her care. PCP is [name], MD. Patient uses transport [facility]ianne Lei to get to HD but then sister has to pick her up because transport not available to transport for [facility] back to [facility] where she lives that late. [facility] [place]. No issues obtaining medications. Patient was previously active with [facility] but has since been discharged. Spoke with the their representative who stated they are able to accept her back at discharge. Sister is aware and agreeable. Patient typically uses a walking stick to get around at home. She also has a shower chair and bedside commode. No further concerns. CSW encouraged patient's sister to contact CSW as needed. CSW will continue to follow patient for support and facilitate return home when stable.               Expected Discharge Plan: [place] Barriers to Discharge: Continued Medical Work up   Patient Goals and CMS Choice     Choice offered to / list presented to : NA  Expected Discharge Plan and Services Expected Discharge Plan: [place] Acute Care Choice: Resumption of Svcs/PTA Provider Living arrangements for the past 2 months: Single Family Home                                      Prior Living Arrangements/Services Living arrangements for the past 2 months: Single Family Home Lives with:: Spouse, Relatives Patient language and need for interpreter reviewed:: Yes Do  you feel safe going back to the place where you live?: Yes      Need for Family Participation in Patient Care: Yes (Comment) Care giver support system in place?: Yes (comment) Current home services: DME Criminal Activity/Legal Involvement Pertinent to Current Situation/Hospitalization: No - Comment as needed  Activities of Daily Living      Permission Sought/Granted Permission sought to share information with : Facility Sport and exercise psychologist, Family Supports    Share Information with NAME: Augustin Schooling  Permission granted to share info w AGENCY: [place] granted to share info w Relationship: Sister  Permission granted to share info w Contact Information: [phone]  Emotional Assessment Appearance:: Appears stated age Attitude/Demeanor/Rapport: Unable to Assess Affect (typically observed): Unable to Assess   Alcohol / Substance Use: Not Applicable Psych Involvement: No (comment)  Admission diagnosis:  Confusion [R41.0] Agitation [R45.1] Patient Active Problem List   Diagnosis Date Noted   Agitation 05/17/2021   Hypertensive urgency 05/17/2021   Carpal tunnel syndrome on right 04/12/2021   Edema 03/29/2021   Pressure injury of skin 03/18/2021   Syncope and collapse 03/16/2021   Chronic anticoagulation 03/16/2021   Hypotension 03/16/2021   Recurrent syncope 03/16/2021   Pressure injury of sacral region, stage 2 ([place]) 03/15/2021   Leakage of vascular dialysis catheter ([place]) 03/03/2021   Renal dialysis device, implant, or graft complication    Recurrent strokes ([place]) 02/25/2021  Left arm pain 02/25/2021   Thrombocytopenia ([place]) 02/25/2021   Other disorders of phosphorus metabolism 01/12/2021   Coagulation defect, unspecified ([place]) 10/12/2020   Diarrhea, unspecified 10/12/2020   Encounter for immunization 10/12/2020   End stage renal disease on dialysis ([place]) 10/12/2020   Fever, unspecified 10/12/2020   Headache, unspecified 10/12/2020    Iron deficiency anemia, unspecified 10/12/2020   Localized edema 10/12/2020   Nontoxic single thyroid nodule 10/12/2020   Occlusion and stenosis of unspecified carotid artery 10/12/2020   Other fluid overload 10/12/2020   Pain, unspecified 10/12/2020   Shortness of breath 10/12/2020   Vascular dementia without behavioral disturbance ([place]) 10/12/2020   Malnutrition of moderate degree 09/13/2020   Anemia of chronic disease 09/10/2020   Sleeping difficulty 07/08/2020   Fatigue 06/09/2020   Mixed Alzheimer's and vascular dementia ([place]) 06/09/2020   Coccydynia 01/26/2020   Leukopenia 12/08/2019   Muscle strain 12/08/2019   Pedal edema 11/26/2019   Dysarthria and anarthria 11/07/2019   Atrial fibrillation ([place]) 10/21/2019   Nocturia 10/21/2019   Seborrheic keratosis 10/21/2019   Carotid stenosis 08/27/2019   Thyroid nodule 08/27/2019   Status post placement of implantable loop recorder 08/07/2019   Anemia in ESRD (end-stage renal disease) ([place]) 04/18/2019   Aphasia 04/12/2019   Benign hypertensive renal disease 04/07/2019   Hyperparathyroidism due to renal insufficiency ([place]) 04/07/2019   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, or unspecified 04/07/2019   Proteinuria 04/07/2019   History of stroke with current residual effects 03/15/2019   Hyperlipidemia associated with type 2 diabetes mellitus ([place]) 07/25/2018   Itching 10/23/2017   Nevus 04/17/2017   CKD (chronic kidney disease), stage V ([place]) 02/02/2015   Vitamin D deficiency 02/02/2015   Obesity (BMI 30-39.9) 08/03/2014   Type II diabetes mellitus with renal manifestations ([place]) 01/29/2014   Essential hypertension 01/13/2014   HLD (hyperlipidemia) 01/13/2014   PCP:  [name], MD [id number] [place], [place] [address] Phone: [phone] Fax: [phone]     Social Determinants of Health  (SDOH) Interventions    Readmission Risk Interventions Readmission Risk Prevention Plan 05/18/2021 03/03/2021 03/16/2019  Transportation Screening Complete Complete Complete  PCP or Specialist Appt within 5-7 Days - - Complete  PCP or Specialist Appt within 3-5 Days Complete Complete -  Home Care Screening - - Complete  Medication Review (RN CM) - - Complete  HRI or Home Care Consult Complete Complete -  Social Work Consult for [place] Planning/Counseling Complete Not Complete -  SW consult not completed comments - RNCM assigned to patient -  Palliative Care Screening Not Applicable Not Applicable -  Medication Review (RN Care Manager) Complete Complete -  Some recent data might be hidden

## 2021-05-18 NOTE — Progress Notes (Signed)
PT Cancellation Note  Patient Details Name: Brianna Dodson MRN: [id number] DOB: Mar 07, 1934   Cancelled Treatment:    Reason Eval/Treat Not Completed: Other (comment): Per nursing patient is not appropriate for rehab services at this time secondary to agitation and inability to follow commands.  Will attempt to see pt at a future date/time as medically appropriate.     Linus Salmons PT, DPT 05/18/21, 9:02 AM

## 2021-05-18 NOTE — Progress Notes (Signed)
OT Cancellation Note  Patient Details Name: Brianna Dodson MRN: [id number] DOB: [birth year]   Cancelled Treatment:    Reason Eval/Treat Not Completed: Other (comment) (per RN pt continues to be agitated and is not following directions. At this time pt is not appropriate for OT evaluation, OT will re-attempt as able.Shanon Payor, OTD OTR/L  05/18/21, 11:42 AM

## 2021-05-18 NOTE — Progress Notes (Signed)
Brianna Dodson at [place] NAME: Brianna Dodson    MR#:  [id number]  DATE OF BIRTH:  25-Jul-1933  SUBJECTIVE:   brought in with altered mental status and delirium with agitation. No family at bedside. Had a chance to speak with granddaughter on the phone. Sitter in the room patient sleeping received Ativan earlier in order to get the MRI of the brain done.  REVIEW OF SYSTEMS:   Review of Systems  Unable to perform ROS: Mental status change  Tolerating Diet: Tolerating PT:   DRUG ALLERGIES:   Allergies  Allergen Reactions   Nsaids     CKD stage III - Avoid all nephrotoxic drugs    VITALS:  Blood pressure (!) 189/65, pulse (!) 50, temperature 99.3 F (37.4 C), temperature source Oral, resp. rate 16, height 5\' 5"  (1.651 m), weight 66 kg, SpO2 100 %.  PHYSICAL EXAMINATION:   Physical Examlimited due to AMS  GENERAL:  87 y.o.-year-old patient lying in the bed with no acute distress.  HEENT: Head atraumatic, normocephalic. Oropharynx and nasopharynx clear.  LUNGS: Normal breath sounds bilaterally, no wheezing, rales, rhonchi. No use of accessory muscles of respiration.  CARDIOVASCULAR: S1, S2 normal. No murmurs, rubs, or gallops.  ABDOMEN: Soft, nontender, nondistended.  EXTREMITIES: No cyanosis, clubbing or edema b/l.    NEUROLOGIC: nonfocal PSYCHIATRIC:  patient is lethargic SKIN: No obvious rash, lesion, or ulcer.   LABORATORY PANEL:  CBC Recent Labs  Lab 05/16/21 2300  WBC 3.5*  HGB 13.7  HCT 40.4  PLT 87*     Chemistries  Recent Labs  Lab 05/16/21 2207  NA 136  K 4.6  CL 95*  CO2 32  GLUCOSE 195*  BUN 17  CREATININE 2.54*  CALCIUM 8.3*  AST 45*  ALT 22  ALKPHOS 66  BILITOT 1.1    Cardiac Enzymes No results for input(s): TROPONINI in the last 168 hours. RADIOLOGY:  CT HEAD WO CONTRAST (5MM)  Result Date: 05/16/2021 CLINICAL DATA:  Multi mental status, initial encounter EXAM: CT HEAD WITHOUT CONTRAST  TECHNIQUE: Contiguous axial images were obtained from the base of the skull through the vertex without intravenous contrast. COMPARISON:  03/17/2021 FINDINGS: Brain: Changes are again seen consistent with prior right MCA infarct. No acute hemorrhage, acute infarction or space-occupying mass lesion is seen. Mild atrophic changes are noted commenced with the patient's age. Chronic white matter ischemic changes are noted as well. Vascular: No hyperdense vessel or unexpected calcification. Skull: Normal. Negative for fracture or focal lesion. Sinuses/Orbits: No acute finding. Other: None. IMPRESSION: Chronic right MCA infarct. Chronic atrophic and ischemic changes. No acute abnormality noted. Electronically Signed   By: Inez Catalina M.D.   On: 05/16/2021 23:40   MR BRAIN WO CONTRAST  Result Date: 05/18/2021 CLINICAL DATA:  Delirium EXAM: MRI HEAD WITHOUT CONTRAST TECHNIQUE: Multiplanar, multiecho pulse sequences of the brain and surrounding structures were obtained without intravenous contrast. COMPARISON:  03/17/2021 FINDINGS: Brain: There is no acute infarction or intracranial hemorrhage. There is no intracranial mass, mass effect, or edema. There is no hydrocephalus or extra-axial fluid collection. Large chronic right MCA territory infarction with chronic blood products primarily involving frontoparietal lobes. Small chronic right cerebellar infarct. Other patchy and confluent areas of T2 hyperintensity in the supratentorial and pontine white matter nonspecific but probably reflect stable chronic microvascular ischemic changes. Scattered foci of susceptibility primarily involving deep gray nuclei likely reflect chronic microhemorrhages secondary to hypertension. Prominence of the ventricles and sulci reflects  stable parenchymal volume loss. Vascular: Major vessel flow voids at the skull base are preserved. Skull and upper cervical spine: Normal marrow signal is preserved. Sinuses/Orbits: Minor mucosal  thickening. Bilateral lens replacement. Other: Sella is unremarkable. Minimal patchy mastoid fluid opacification. IMPRESSION: No acute infarction, hemorrhage, or mass. Chronic/nonemergent findings detailed above are unchanged from prior MRI. Electronically Signed   By: Macy Mis M.D.   On: 05/18/2021 11:03   DG Chest Portable 1 View  Result Date: 05/16/2021 CLINICAL DATA:  Altered mental status.  Dialysis patient. EXAM: PORTABLE CHEST 1 VIEW COMPARISON:  03/16/2021 FINDINGS: Right-sided dialysis catheter tip at the atrial caval junction. Loop recorder in the left chest wall. Chronic elevation of right hemidiaphragm. Minor adjacent atelectasis/scarring. No acute airspace disease. Stable heart size and mediastinal contours. No pulmonary edema, large pleural effusion or pneumothorax. Stable osseous structures. IMPRESSION: 1. No acute chest finding. 2. Chronic elevation of right hemidiaphragm with adjacent atelectasis/scarring. Electronically Signed   By: Keith Rake M.D.   On: 05/16/2021 21:54   ASSESSMENT AND PLAN:   Brianna Dodson is a 87 y.o. female with medical history significant for ESRD on HD MWF, anemia of CKD, paroxysmal A. fib on Eliquis, DM, Alzheimer's disease, OSA, HTN, DM, recurrent strokes, who was brought in by EMS for agitation and altered mental status.  EMS had been called out to the home 3 times in the past few days for altered mental status but family declined transport to the emergency room.  Patient went to dialysis earlier in the day but became agitated on her arrival back home and this time family agreed for her to be brought in.    Chest x-ray nonacute Head CT with chronic right MCA infarct chronic atrophic and ischemic changes.  Acute encephalopathy suspected metabolic with malignant hypertension Alzheimer's dementia along with vascular dementia acute delirium-- unclear etiology -- IV PRN labetalol -- clonidine patch -- patient not awake enough to take her multiple  PO blood pressure meds -- per family patient started getting agitated/delirious after she came back from dialysis yesterday -- in the ER patient was lethargic. Unable to hold any conversation. -- CT head negative for acute abnormality. Shows chronic stroke and chronic small vessel ischemic changes. -- Will try get MRI of the brain-- discussed with granddaughter on the phone -- PRN Haldol --11/16--received ativan earlier for MRI brain--Neg for acute CVA  End-stage renal disease on hemodialysis Monday Wednesday Friday -- nephrology consultation with Dr. Juleen China  type II diabetes with renal failure -- continue sliding scale insulin until patient is awake and PO intake improves -- A1c 5.5  history of CVA with vascular dementia -- MRI of the brain--no acute CVA, Chronic CVA's  Hyperlipidemia -- resume statins once able to tolerate PO's   Procedures: Family communication :grand-dter Rica Mast Consults : nephrology CODE STATUS: full DVT Prophylaxis : heparin Level of care: Med-Surg Status is: Inpatient  Remains inpatient appropriate because: workup for metabolic encephalopathy   TOC for discharge planning. Granddaughter tells me she wants to take patient home at discharge.     TOTAL TIME TAKING CARE OF THIS PATIENT:25 minutes.  >50% time spent on counselling and coordination of care  Note: This dictation was prepared with Dragon dictation along with smaller phrase technology. Any transcriptional errors that result from this process are unintentional.  Fritzi Mandes M.D    [facility]   CC: Primary care physician; [name], MD Patient ID: [name], female   DOB: 09/07/1933, 87 y.o.   MRN:  [id number]  

## 2021-05-19 DIAGNOSIS — I639 Cerebral infarction, unspecified: Secondary | ICD-10-CM | POA: Diagnosis not present

## 2021-05-19 DIAGNOSIS — I10 Essential (primary) hypertension: Secondary | ICD-10-CM

## 2021-05-19 DIAGNOSIS — F0280 Dementia in other diseases classified elsewhere without behavioral disturbance: Secondary | ICD-10-CM | POA: Diagnosis not present

## 2021-05-19 DIAGNOSIS — G309 Alzheimer's disease, unspecified: Secondary | ICD-10-CM | POA: Diagnosis not present

## 2021-05-19 DIAGNOSIS — G9341 Metabolic encephalopathy: Secondary | ICD-10-CM

## 2021-05-19 DIAGNOSIS — D696 Thrombocytopenia, unspecified: Secondary | ICD-10-CM

## 2021-05-19 DIAGNOSIS — F0150 Vascular dementia without behavioral disturbance: Secondary | ICD-10-CM | POA: Diagnosis not present

## 2021-05-19 DIAGNOSIS — R410 Disorientation, unspecified: Secondary | ICD-10-CM | POA: Diagnosis not present

## 2021-05-19 DIAGNOSIS — N186 End stage renal disease: Secondary | ICD-10-CM | POA: Diagnosis not present

## 2021-05-19 LAB — URINE CULTURE: Culture: >=100000 — AB

## 2021-05-19 LAB — GLUCOSE, CAPILLARY
Glucose-Capillary: 92 mg/dL (ref 70–99)
Glucose-Capillary: 119 mg/dL — ABNORMAL HIGH (ref 70–99)
Glucose-Capillary: 93 mg/dL (ref 70–99)
Glucose-Capillary: 95 mg/dL (ref 70–99)

## 2021-05-19 NOTE — Procedures (Addendum)
Patient Name: Brianna Dodson  MRN: [id number]  Epilepsy Attending: Lora Havens  Referring Physician/Provider: Dr Fritzi Mandes Date: 05/19/2021 Duration: 25.15 mins  Patient history: 87yo M with ams. EEG to evaluate for seizure  Level of alertness: Awake  AEDs during EEG study: None  Technical aspects: This EEG study was done with scalp electrodes positioned according to the 10-20 International system of electrode placement. Electrical activity was acquired at a sampling rate of 500Hz  and reviewed with a high frequency filter of 70Hz  and a low frequency filter of 1Hz . EEG data were recorded continuously and digitally stored.   Description: No clear posterior dominant rhythm was seen. EEG showed continuous generalized 5-7hz  theta slowing. There is also 2-3hz  delta slowing in left frontotemporal region. Physiologic photic driving was not seen during photic stimulation.  Hyperventilation was not performed.     ABNORMALITY - Continuous slow, generalized  and maximal left frontotemporal region  IMPRESSION: This study is suggestive of cortical dysfunction arising from  left frontotemporal region, nonspecific etiology but could be secondary to underlying structural abnormality, post-ictal state. Additionally there is moderate diffuse encephalopathy, nonspecific etiology. No seizures or epileptiform discharges were seen throughout the recording.  [name] [name]

## 2021-05-19 NOTE — Progress Notes (Signed)
[place] at [place] NAME: Brianna Dodson    MR#:  [id number]  DATE OF BIRTH:  [birth year]  SUBJECTIVE:   Bedside sitter reports pt has been calm, non verbal, tried to feed her--does not eat. Per RN not able to take any po meds  Pt looking up in the ceiling. Non verbal. No fever  REVIEW OF SYSTEMS:   Review of Systems  Unable to perform ROS: Mental status change  Tolerating Diet: Tolerating PT:   DRUG ALLERGIES:   Allergies  Allergen Reactions   Nsaids     CKD stage III - Avoid all nephrotoxic drugs    VITALS:  Blood pressure (!) 166/75, pulse 69, temperature 98.4 F (36.9 C), temperature source Oral, resp. rate 17, height 5\' 5"  (1.651 m), weight 60.4 kg, SpO2 100 %.  PHYSICAL EXAMINATION:   Physical Examlimited due to AMS  GENERAL:  87 y.o.-year-old patient lying in the bed with no acute distress.  HEENT: Head atraumatic, normocephalic. Oropharynx and nasopharynx clear.  LUNGS: Normal breath sounds bilaterally, no wheezing, rales, rhonchi. No use of accessory muscles of respiration.  CARDIOVASCULAR: S1, S2 normal. No murmurs, rubs, or gallops.  ABDOMEN: Soft, nontender, nondistended.  EXTREMITIES: No cyanosis, clubbing or edema b/l.    NEUROLOGIC: nonfocal--non verbal PSYCHIATRIC:  patient is awake but non communicative SKIN: per RN  LABORATORY PANEL:  CBC Recent Labs  Lab 05/18/21 1545  WBC 5.7  HGB 13.6  HCT 40.2  PLT 99*     Chemistries  Recent Labs  Lab 05/16/21 2207 05/18/21 1545  NA 136 140  K 4.6 4.7  CL 95* 101  CO2 32 30  GLUCOSE 195* 133*  BUN 17 35*  CREATININE 2.54* 3.82*  CALCIUM 8.3* 9.3  AST 45*  --   ALT 22  --   ALKPHOS 66  --   BILITOT 1.1  --     Cardiac Enzymes No results for input(s): TROPONINI in the last 168 hours. RADIOLOGY:  MR BRAIN WO CONTRAST  Result Date: 05/18/2021 CLINICAL DATA:  Delirium EXAM: MRI HEAD WITHOUT CONTRAST TECHNIQUE: Multiplanar, multiecho pulse  sequences of the brain and surrounding structures were obtained without intravenous contrast. COMPARISON:  03/17/2021 FINDINGS: Brain: There is no acute infarction or intracranial hemorrhage. There is no intracranial mass, mass effect, or edema. There is no hydrocephalus or extra-axial fluid collection. Large chronic right MCA territory infarction with chronic blood products primarily involving frontoparietal lobes. Small chronic right cerebellar infarct. Other patchy and confluent areas of T2 hyperintensity in the supratentorial and pontine white matter nonspecific but probably reflect stable chronic microvascular ischemic changes. Scattered foci of susceptibility primarily involving deep gray nuclei likely reflect chronic microhemorrhages secondary to hypertension. Prominence of the ventricles and sulci reflects stable parenchymal volume loss. Vascular: Major vessel flow voids at the skull base are preserved. Skull and upper cervical spine: Normal marrow signal is preserved. Sinuses/Orbits: Minor mucosal thickening. Bilateral lens replacement. Other: Sella is unremarkable. Minimal patchy mastoid fluid opacification. IMPRESSION: No acute infarction, hemorrhage, or mass. Chronic/nonemergent findings detailed above are unchanged from prior MRI. Electronically Signed   By: Macy Mis M.D.   On: 05/18/2021 11:03   ASSESSMENT AND PLAN:   Brianna Dodson is a 87 y.o. female with medical history significant for ESRD on HD MWF, anemia of CKD, paroxysmal A. fib on Eliquis, DM, Alzheimer's disease, OSA, HTN, DM, recurrent strokes, who was brought in by EMS for agitation and altered mental status.  EMS  had been called out to the home 3 times in the past few days for altered mental status but family declined transport to the emergency room.  Patient went to dialysis earlier in the day but became agitated on her arrival back home and this time family agreed for her to be brought in.    Chest x-ray nonacute Head CT with  chronic right MCA infarct chronic atrophic and ischemic changes.  Acute encephalopathy suspected metabolic with malignant hypertension Alzheimer's dementia along with vascular dementia acute delirium-- unclear etiology -- IV PRN labetalol -- clonidine patch -- patient not awake enough to take her multiple PO blood pressure meds -- per family patient started getting agitated/delirious after she came back from dialysis yesterday -- in the ER patient was lethargic. Unable to hold any conversation. -- CT head negative for acute abnormality. Shows chronic stroke and chronic small vessel ischemic changes. -- Will try get MRI of the brain-- discussed with granddaughter on the phone -- PRN Haldol --11/16--received ativan earlier for MRI brain--Neg for acute CVA --11/17--pt awake but non verbal. Remains calm.not eating. RN unable to give po meds --not on any sedating meds. --overall declining in the last 2-3 days. Palliative care consult placed.  End-stage renal disease on hemodialysis Monday Wednesday Friday -- nephrology consultation with Dr. Juleen China  type II diabetes with renal failure -- d/c SSI --cont sugar checks 5 times a day -- A1c 5.5  history of CVA with vascular dementia -- 11/16--MRI of the brain--no acute CVA, Chronic CVA's  Hyperlipidemia --not taking any po meds  HTN --not taking any po meds --cont clonidine patch and prn IV labetalol  Overall poor prognosis   Procedures: Family communication :dr Juleen China plans to speak withpt's sister and grand-dter today (11/17) Consults : nephrology CODE STATUS: full DVT Prophylaxis : heparin Level of care: Med-Surg Status is: Inpatient  Remains inpatient appropriate because: workup for metabolic encephalopathy Overall declining. Palliative care to see pt   TOTAL TIME TAKING CARE OF THIS PATIENT:25 minutes.  >50% time spent on counselling and coordination of care  Note: This dictation was prepared with Dragon dictation  along with smaller phrase technology. Any transcriptional errors that result from this process are unintentional.  Brianna Dodson M.D    [facility]   CC: Primary care physician; [name], MD [name], female   DOB: [birth year], 87 y.o.   MRN: [id number]

## 2021-05-19 NOTE — Evaluation (Signed)
Physical Therapy Evaluation Patient Details Name: Brianna Dodson MRN: [id number] DOB: 1933-08-18 Today's Date: 05/19/2021  History of Present Illness  Pt is an 87 y/o F with PMH: multiple strokes most recent right MCA stroke in March 2022 with residual left-sided weakness, with worsening left-sided weakness after left upper extremity AV fistula surgery on 8/25 (Dr. [name]). Pt presented to ED d/t L arm pain/throbbing starting the night after the procedure. MRI reviewed by neurology and assessment as follows: "although formal radiology read is concerning for acute on chronic infarction it looks like cortical laminar necrosis and not like a new stroke.  She does have left hand grip weakness and left wrist drop which is new from before, but I do not suspect that this is from a new stroke." Pt now s/p ligation of L UE HD cath site to improve perfusion on 02/26/21.  Clinical Impression  Pt received sitting EOB as OT is in room; PT joining for co-eval. Pt was unable to follow commands in session; she was non-verbal however did make some moaning noises during STS. Pt performed STS x2 reps with MAX A of 2 to lift, block and steady. Pt did participate minimally, possibly due to automatic response to functional activity. Sitting balance EOB ranged from CGA to MAX A. With PT/OT guarding, pt demo increased trunk sway in all directions with righting response to correct ~50% of the time. Sitting balance did improve after first bout of standing. Overall, session limited due to pt current cognitive status and inability to follow directions. Would benefit from skilled PT to address above deficits and promote optimal return to PLOF.         Recommendations for follow up therapy are one component of a multi-disciplinary discharge planning process, led by the attending physician.  Recommendations may be updated based on patient status, additional functional criteria and insurance authorization.  Follow Up Recommendations  Skilled nursing-short term rehab (<3 hours/day)    Assistance Recommended at Discharge Frequent or constant Supervision/Assistance  Functional Status Assessment Patient has had a recent decline in their functional status and demonstrates the ability to make significant improvements in function in a reasonable and predictable amount of time.  Equipment Recommendations  Other (comment) (TBD at next venue of care)    Recommendations for Other Services       Precautions / Restrictions Precautions Precautions: Fall Restrictions Weight Bearing Restrictions: No      Mobility  Bed Mobility Overal bed mobility: Needs Assistance Bed Mobility: Supine to Sit;Sit to Supine     Supine to sit: Total assist;+2 for physical assistance Sit to supine: +2 for physical assistance;Total assist   General bed mobility comments: pt lethargic and does not attempt to participate    Transfers Overall transfer level: Needs assistance Equipment used: 2 person hand held assist Transfers: Sit to/from Stand Sit to Stand: Max assist;+2 physical assistance;From elevated surface           General transfer comment: 2 reps - pt does participate in stand due to automatic response.    Ambulation/Gait               General Gait Details: deferred - pt lethargic and not following commands  Stairs            Wheelchair Mobility    Modified Rankin (Stroke Patients Only)       Balance Overall balance assessment: Needs assistance Sitting-balance support: Feet supported;No upper extremity supported Sitting balance-Leahy Scale: Poor Sitting balance - Comments: Ranged between CGA  to MAX A. Pt with strong right lateral lean during static sitting - improved righting response after standing requirng CGA intermittently. Postural control: Right lateral lean Standing balance support: Bilateral upper extremity supported Standing balance-Leahy Scale: Poor Standing balance comment: MAX A to stand and  maintain standing                             Pertinent Vitals/Pain Pain Assessment: Faces Faces Pain Scale: No hurt    Home Living Family/patient expects to be discharged to:: Private residence Living Arrangements: Other relatives;Spouse/significant other (granddaughter/husband per chart) Available Help at Discharge: Family;Available 24 hours/day Type of Home: House Home Access: Stairs to enter       Home Layout: Two level;Bed/bath upstairs Home Equipment: Kasandra Knudsen - single point;Shower seat;BSC/3in1 Additional Comments: home setup per chart from 01/2021 admission    Prior Function Prior Level of Function : Patient poor historian/Family not available             Mobility Comments: assist for household mobility - all PLOF information taken from previous admissions or chart review as pt was non-verbal through session. ADLs Comments: assist for ADLs per 01/2021 eval     Hand Dominance        Extremity/Trunk Assessment   Upper Extremity Assessment Upper Extremity Assessment: Difficult to assess due to impaired cognition RUE Deficits / Details: unable to grip/assess ROM on command. Achieves 3/5 grip and bicep 2/5. Elbow AROM ~90*    Lower Extremity Assessment Lower Extremity Assessment: Difficult to assess due to impaired cognition (unable to assess)    Cervical / Trunk Assessment Cervical / Trunk Assessment: Kyphotic  Communication   Communication: Other (comment) (pt nonverbal during session, vocalizes with moaning during mobility)  Cognition Arousal/Alertness: Lethargic Behavior During Therapy: Flat affect Overall Cognitive Status: No family/caregiver present to determine baseline cognitive functioning                                 General Comments: non-verbal during eval        General Comments      Exercises Other Exercises Other Exercises: Pt educ re: importance of mobility for functional strengthening Other Exercises: Sup<>sit,  sit<>stand x2, attempted eating, attempted grooming   Assessment/Plan    PT Assessment Patient needs continued PT services  PT Problem List Decreased strength;Decreased mobility;Decreased safety awareness;Decreased range of motion;Decreased coordination;Decreased activity tolerance;Decreased cognition;Decreased balance       PT Treatment Interventions DME instruction;Therapeutic activities;Gait training;Therapeutic exercise;Patient/family education;Stair training;Balance training;Wheelchair mobility training;Functional mobility training;Neuromuscular re-education    PT Goals (Current goals can be found in the Care Plan section)  Acute Rehab PT Goals PT Goal Formulation: Patient unable to participate in goal setting    Frequency Min 2X/week   Barriers to discharge        Co-evaluation   Reason for Co-Treatment: Complexity of the patient's impairments (multi-system involvement);Necessary to address cognition/behavior during functional activity;For patient/therapist safety;To address functional/ADL transfers PT goals addressed during session: Mobility/safety with mobility;Balance OT goals addressed during session: ADL's and self-care       AM-PAC PT "6 Clicks" Mobility  Outcome Measure Help needed turning from your back to your side while in a flat bed without using bedrails?: Total Help needed moving from lying on your back to sitting on the side of a flat bed without using bedrails?: Total Help needed moving to and from a bed  to a chair (including a wheelchair)?: Total Help needed standing up from a chair using your arms (e.g., wheelchair or bedside chair)?: Total Help needed to walk in hospital room?: Total Help needed climbing 3-5 steps with a railing? : Total 6 Click Score: 6    End of Session Equipment Utilized During Treatment: Gait belt Activity Tolerance: Patient limited by lethargy Patient left: in bed;with call bell/phone within reach;with bed alarm set;with  nursing/sitter in room Nurse Communication: Mobility status PT Visit Diagnosis: Unsteadiness on feet (R26.81);Muscle weakness (generalized) (M62.81);History of falling (Z91.81);Difficulty in walking, not elsewhere classified (R26.2);Hemiplegia and hemiparesis Hemiplegia - Right/Left: Left    Time: 1771-1657 PT Time Calculation (min) (ACUTE ONLY): 13 min   Charges:   PT Evaluation $PT Eval Moderate Complexity: 1 Mod         Iden Stripling PT, DPT 05/19/21 1:22 PM [phone]

## 2021-05-19 NOTE — Progress Notes (Signed)
Eeg done 

## 2021-05-19 NOTE — Plan of Care (Addendum)
Pt is lethargic, responds to voice. Unable to take her scheduled medicine. V/S stable. Safety mittens kept in place to prevent her from pulling her IV line.

## 2021-05-19 NOTE — Progress Notes (Signed)
   05/19/21 1520  Clinical Encounter Type  Visited With Family  Visit Type Initial;Spiritual support;Social support  Referral From Other (Comment) (rounding)  Spiritual Encounters  Spiritual Needs Emotional  Chaplain Deloria Lair was present in [place] when noticed Pt's sister. Chaplain offered emotional support and reflective listening. Engaged sister (Mrs. Richardson Landry) and nephew. Learned Pt's location and offered to f/u for continued support. Chaplain offered silent prayer for family.

## 2021-05-19 NOTE — Plan of Care (Signed)
  Problem: Safety: Goal: Ability to remain free from injury will improve Outcome: Progressing   Problem: Skin Integrity: Goal: Risk for impaired skin integrity will decrease Outcome: Progressing   

## 2021-05-19 NOTE — Progress Notes (Signed)
[facility]  ROUNDING NOTE   Subjective:   Hemodialysis yesterday. Sitter at bedside.   Patient is not waking up. Eyes are open but only responding to painful stimuli.   Sister and granddaughter are at bedside.    Objective:  Vital signs in last 24 hours:  Temp:  [98.1 F (36.7 C)-99 F (37.2 C)] 98.4 F (36.9 C) (11/17 0749) Pulse Rate:  [26-84] 69 (11/17 0749) Resp:  [12-21] 17 (11/17 0407) BP: (116-249)/(49-189) 166/75 (11/17 0749) SpO2:  [98 %-100 %] 100 % (11/17 0749) Weight:  [60.4 kg] 60.4 kg (11/16 1545)  Weight change: -5.6 kg Filed Weights   05/17/21 1251 05/18/21 1545  Weight: 66 kg 60.4 kg    Intake/Output: I/O last 3 completed shifts: In: 0  Out: 500 [Other:500]   Intake/Output this shift:  No intake/output data recorded.  Physical Exam: General: Laying in bed  Head: Normocephalic, atraumatic. Moist oral mucosal membranes  Eyes: Anicteric, PERRL  Neck: Supple, trachea midline  Lungs:  Clear to auscultation  Heart: Regular rate and rhythm  Abdomen:  Soft, nontender,   Extremities:  No peripheral edema.  Neurologic: Somnolent. Responds to painful stimuli  Skin: No lesions  Access: Right IJ permcath    Basic Metabolic Panel: Recent Labs  Lab 05/16/21 2207 05/18/21 1545  NA 136 140  K 4.6 4.7  CL 95* 101  CO2 32 30  GLUCOSE 195* 133*  BUN 17 35*  CREATININE 2.54* 3.82*  CALCIUM 8.3* 9.3  PHOS  --  5.4*     Liver Function Tests: Recent Labs  Lab 05/16/21 2207 05/18/21 1545  AST 45*  --   ALT 22  --   ALKPHOS 66  --   BILITOT 1.1  --   PROT 7.9  --   ALBUMIN 3.8 3.4*    No results for input(s): LIPASE, AMYLASE in the last 168 hours. No results for input(s): AMMONIA in the last 168 hours.  CBC: Recent Labs  Lab 05/16/21 2300 05/18/21 1545  WBC 3.5* 5.7  NEUTROABS 1.8  --   HGB 13.7 13.6  HCT 40.4 40.2  MCV 87.6 86.3  PLT 87* 99*     Cardiac Enzymes: No results for input(s): CKTOTAL, CKMB,  CKMBINDEX, TROPONINI in the last 168 hours.  BNP: Invalid input(s): POCBNP  CBG: Recent Labs  Lab 05/18/21 0835 05/18/21 1140 05/18/21 2118 05/19/21 0748 05/19/21 1140  GLUCAP 178* 138* 111* 92 119*     Microbiology: Results for orders placed or performed during the hospital encounter of 05/16/21  Resp Panel by RT-PCR (Flu A&B, Covid) Nasopharyngeal Swab     Status: None   Collection Time: 05/16/21 10:07 PM   Specimen: Nasopharyngeal Swab; Nasopharyngeal(NP) swabs in vial transport medium  Result Value Ref Range Status   SARS Coronavirus 2 by RT PCR NEGATIVE NEGATIVE Final    Comment: (NOTE) SARS-CoV-2 target nucleic acids are NOT DETECTED.  The SARS-CoV-2 RNA is generally detectable in upper respiratory specimens during the acute phase of infection. The lowest concentration of SARS-CoV-2 viral copies this assay can detect is 138 copies/mL. A negative result does not preclude SARS-Cov-2 infection and should not be used as the sole basis for treatment or other patient management decisions. A negative result may occur with  improper specimen collection/handling, submission of specimen other than nasopharyngeal swab, presence of viral mutation(s) within the areas targeted by this assay, and inadequate number of viral copies(<138 copies/mL). A negative result must be combined with clinical observations, patient history,  and epidemiological information. The expected result is Negative.  Fact Sheet for Patients:  EntrepreneurPulse.com.au  Fact Sheet for Healthcare Providers:  IncredibleEmployment.be  This test is no t yet approved or cleared by the [place] FDA and  has been authorized for detection and/or diagnosis of SARS-CoV-2 by FDA under an Emergency Use Authorization (EUA). This EUA will remain  in effect (meaning this test can be used) for the duration of the COVID-19 declaration under Section 564(b)(1) of the Act,  21 U.S.C.section 360bbb-3(b)(1), unless the authorization is terminated  or revoked sooner.       Influenza A by PCR NEGATIVE NEGATIVE Final   Influenza B by PCR NEGATIVE NEGATIVE Final    Comment: (NOTE) The Xpert Xpress SARS-CoV-2/FLU/RSV plus assay is intended as an aid in the diagnosis of influenza from Nasopharyngeal swab specimens and should not be used as a sole basis for treatment. Nasal washings and aspirates are unacceptable for Xpert Xpress SARS-CoV-2/FLU/RSV testing.  Fact Sheet for Patients: EntrepreneurPulse.com.au  Fact Sheet for Healthcare Providers: IncredibleEmployment.be  This test is not yet approved or cleared by the [place] FDA and has been authorized for detection and/or diagnosis of SARS-CoV-2 by FDA under an Emergency Use Authorization (EUA). This EUA will remain in effect (meaning this test can be used) for the duration of the COVID-19 declaration under Section 564(b)(1) of the Act, 21 U.S.C. section 360bbb-3(b)(1), unless the authorization is terminated or revoked.  Performed at [facility], [address] [place], [place] [address]   Urine Culture     Status: Abnormal   Collection Time: 05/16/21 10:07 PM   Specimen: In/Out Cath Urine  Result Value Ref Range Status   Specimen Description   Final    IN/OUT CATH URINE Performed at [facility], [address], [place], [place] [address]    Special Requests   Final    NONE Performed at [facility], [address] [place], [place] [address]    Culture (A)  Final    >=100,000 COLONIES/mL GARDNERELLA VAGINALIS Standardized susceptibility testing for this organism is not available. 80,000 COLONIES/mL DIPHTHEROIDS(CORYNEBACTERIUM SPECIES)    Report Status 05/19/2021 FINAL  Final    Coagulation Studies: No results for input(s): LABPROT, INR in the last 72 hours.  Urinalysis: Recent Labs    05/16/21 2214  COLORURINE  YELLOW*  LABSPEC 1.013  PHURINE 8.0  GLUCOSEU NEGATIVE  HGBUR NEGATIVE  BILIRUBINUR NEGATIVE  KETONESUR NEGATIVE  PROTEINUR 100*  NITRITE NEGATIVE  LEUKOCYTESUR MODERATE*       Imaging: MR BRAIN WO CONTRAST  Result Date: 05/18/2021 CLINICAL DATA:  Delirium EXAM: MRI HEAD WITHOUT CONTRAST TECHNIQUE: Multiplanar, multiecho pulse sequences of the brain and surrounding structures were obtained without intravenous contrast. COMPARISON:  03/17/2021 FINDINGS: Brain: There is no acute infarction or intracranial hemorrhage. There is no intracranial mass, mass effect, or edema. There is no hydrocephalus or extra-axial fluid collection. Large chronic right MCA territory infarction with chronic blood products primarily involving frontoparietal lobes. Small chronic right cerebellar infarct. Other patchy and confluent areas of T2 hyperintensity in the supratentorial and pontine white matter nonspecific but probably reflect stable chronic microvascular ischemic changes. Scattered foci of susceptibility primarily involving deep gray nuclei likely reflect chronic microhemorrhages secondary to hypertension. Prominence of the ventricles and sulci reflects stable parenchymal volume loss. Vascular: Major vessel flow voids at the skull base are preserved. Skull and upper cervical spine: Normal marrow signal is preserved. Sinuses/Orbits: Minor mucosal thickening. Bilateral lens replacement. Other: Sella is  unremarkable. Minimal patchy mastoid fluid opacification. IMPRESSION: No acute infarction, hemorrhage, or mass. Chronic/nonemergent findings detailed above are unchanged from prior MRI. Electronically Signed   By: Macy Mis M.D.   On: 05/18/2021 11:03     Medications:     amLODipine  5 mg Oral Daily   apixaban  2.5 mg Oral BID   ascorbic acid  500 mg Oral Daily   atorvastatin  80 mg Oral Daily   carvedilol  6.25 mg Oral BID WC   Chlorhexidine Gluconate Cloth  6 each Topical Daily   Chlorhexidine  Gluconate Cloth  6 each Topical Q0600   cloNIDine  0.1 mg Transdermal Weekly   ezetimibe  10 mg Oral Daily   ferrous gluconate  324 mg Oral Q1500   hydrALAZINE  50 mg Oral BID   linagliptin  5 mg Oral Daily   losartan  100 mg Oral Daily   multivitamin  1 tablet Oral QHS   acetaminophen **OR** acetaminophen, labetalol, ondansetron **OR** ondansetron (ZOFRAN) IV, senna-docusate  Assessment/ Plan:  Ms. Nealie Mchatton is a 87 y.o. black female with end stage renal disease on hemodialysis, hypertension, CVA, vascular dementia, hyperlipidemia, diabetes mellitus type II, atrial fibrillation who is admitted to [facility] on 05/16/2021 for Confusion [R41.0] Agitation [R45.1]   [facility] MWF [facility] [address] RIJ permcath 65.5kg   End Stage Renal Disease:  - tentatively placed on dialysis for today    Altered mental status:  - discontinue pregablin.  - No improvement after hemodialysis treatment - EEG ordered.    Hypertension: with hypertension urgency. Home regimen includes losartan, amlodipine, carvedilol, hydralazine. Blood pressure this morning of 166/75 - IV labatelol PRN    Anemia of chronic kidney disease: hemoglobin 13.6. Normocytic. Mircera as outpatient. Outpatient iron levels at goal on 9/7.     Secondary Hyperparathyroidism: PTH, calcium and phosphorus at goal on 11/2. Not currently on binders.   Overall prognosis is poor. Palliative care has been consulted. Patient's family understands.    LOS: 2 Lattie Cervi 11/17/20222:12 PM

## 2021-05-19 NOTE — Progress Notes (Signed)
Occupational Therapy Evaluation Patient Details Name: Brianna Dodson MRN: [id number] DOB: 09/01/1933 Today's Date: 05/19/2021   History of Present Illness Pt is an 87 y/o F with PMH: multiple strokes most recent right MCA stroke in March 2022 with residual left-sided weakness, with worsening left-sided weakness after left upper extremity AV fistula surgery on 8/25 (Dr. [name]). Pt presented to ED d/t L arm pain/throbbing starting the night after the procedure. MRI reviewed by neurology and assessment as follows: "although formal radiology read is concerning for acute on chronic infarction it looks like cortical laminar necrosis and not like a new stroke.  She does have left hand grip weakness and left wrist drop which is new from before, but I do not suspect that this is from a new stroke." Pt now s/p ligation of L UE HD cath site to improve perfusion on 02/26/21.   Clinical Impression   Brianna Dodson was seen for OT/PT co-evaluation this date. Prior to hospital admission, per chart review pt was able to ambulate in the home. Pt lives with family. Currently pt demonstrates impairments as described below (See OT problem list) which functionally limit her ability to perform ADL/self-care tasks. Pt lethargic and displaying limited responses to stimuli. Pt CGA- MAX A static sitting, EOB ~ 20 min. Attempted to assist with feeding, pt drooling but does not withdraw/engage with food. MAX A face washing seated EOB - pt responds to cold wash cloth by raising hand ~1/2 way towards face. Pt with strong right lateral lean static sitting - improved righting response after standing requiring CGA intermittently. 2/2 pt condition, will trial OT to address noted impairments and functional limitations (see below for any additional details) in order to maximize safety and independence while minimizing falls risk and caregiver burden. Upon hospital discharge, recommend STR to maximize pt safety and return to PLOF.        Recommendations for follow up therapy are one component of a multi-disciplinary discharge planning process, led by the attending physician.  Recommendations may be updated based on patient status, additional functional criteria and insurance authorization.   Follow Up Recommendations  Skilled nursing-short term rehab (<3 hours/day)    Assistance Recommended at Discharge Frequent or constant Supervision/Assistance  Functional Status Assessment  Patient has had a recent decline in their functional status and/or demonstrates limited ability to make significant improvements in function in a reasonable and predictable amount of time  Equipment Recommendations  Other (comment) (defer to next venue of care)    Recommendations for Other Services       Precautions / Restrictions Precautions Precautions: Fall Restrictions Weight Bearing Restrictions: No      Mobility Bed Mobility Overal bed mobility: Needs Assistance Bed Mobility: Supine to Sit;Sit to Supine     Supine to sit: Total assist;+2 for physical assistance Sit to supine: +2 for physical assistance;Total assist        Transfers Overall transfer level: Needs assistance Equipment used: 2 person hand held assist Transfers: Sit to/from Stand Sit to Stand: Max assist;+2 physical assistance;From elevated surface                  Balance Overall balance assessment: Needs assistance Sitting-balance support: Feet supported;No upper extremity supported Sitting balance-Leahy Scale: Poor Sitting balance - Comments: Pt with strong right lateral lean static sitting - improved righting response after standing requirng CGA intermittently   Standing balance support: Bilateral upper extremity supported Standing balance-Leahy Scale: Poor  ADL either performed or assessed with clinical judgement   ADL Overall ADL's : Needs assistance/impaired                                        General ADL Comments: Pt lethargic and displaying limited responses to stimuli. Pt CGA- MAX A static sitting, EOB ~ 20 min. Attempted to assist with feeding, pt drooling but does not withdraw/engage with food. MAX A face washing seated EOB - pt responds to cold wash cloth by raising hand ~1/2 way towards face.      Pertinent Vitals/Pain Pain Assessment: Faces Faces Pain Scale: No hurt     Hand Dominance     Extremity/Trunk Assessment Upper Extremity Assessment Upper Extremity Assessment: Difficult to assess due to impaired cognition;RUE deficits/detail RUE Deficits / Details: unable to grip/assess ROM on command. Achieves 3/5 grip and bicep 2/5. Elbow AROM ~90*   Lower Extremity Assessment Lower Extremity Assessment: Difficult to assess due to impaired cognition       Communication Communication Communication: Other (comment) (pt nonverbal during session, vocalizes with mobility)   Cognition Arousal/Alertness: Lethargic Behavior During Therapy: Flat affect Overall Cognitive Status: No family/caregiver present to determine baseline cognitive functioning                                       General Comments       Exercises Exercises: Other exercises Other Exercises Other Exercises: Pt educ re: importance of mobility for functional strengthening Other Exercises: Sup<>sit, sit<>stand x2, attempted eating, attempted grooming   Shoulder Instructions      Home Living Family/patient expects to be discharged to:: Private residence Living Arrangements: Other relatives;Spouse/significant other (granddaughter/husband per chart) Available Help at Discharge: Family;Available 24 hours/day Type of Home: House Home Access: Stairs to enter     Home Layout: Two level;Bed/bath upstairs     Bathroom Shower/Tub: Teacher, early years/pre: Standard     Home Equipment: Cane - single point;Shower seat;BSC/3in1   Additional Comments: home setup  per chart from 01/2021 admission      Prior Functioning/Environment Prior Level of Function : Patient poor historian/Family not available             Mobility Comments: assist for household mobility ADLs Comments: assist for ADLs per 01/2021 eval        OT Problem List: Decreased strength;Decreased range of motion;Decreased activity tolerance;Impaired balance (sitting and/or standing);Decreased coordination;Decreased cognition;Decreased safety awareness;Decreased knowledge of use of DME or AE;Decreased knowledge of precautions      OT Treatment/Interventions: Self-care/ADL training;Therapeutic exercise;Therapeutic activities    OT Goals(Current goals can be found in the care plan section) Acute Rehab OT Goals Patient Stated Goal: none stated OT Goal Formulation: Patient unable to participate in goal setting Time For Goal Achievement: 06/02/21 Potential to Achieve Goals: Fair ADL Goals Pt Will Perform Grooming: with min assist;sitting Pt Will Perform Lower Body Dressing: with mod assist;sitting/lateral leans Pt Will Transfer to Toilet: bedside commode;with mod assist;ambulating  OT Frequency: Min 1X/week   Barriers to D/C: Inaccessible home environment          Co-evaluation PT/OT/SLP Co-Evaluation/Treatment: Yes Reason for Co-Treatment: Complexity of the patient's impairments (multi-system involvement);Necessary to address cognition/behavior during functional activity;For patient/therapist safety;To address functional/ADL transfers PT goals addressed during session: Mobility/safety with mobility OT goals  addressed during session: ADL's and self-care      AM-PAC OT "6 Clicks" Daily Activity     Outcome Measure Help from another person eating meals?: A Lot Help from another person taking care of personal grooming?: A Lot Help from another person toileting, which includes using toliet, bedpan, or urinal?: Total Help from another person bathing (including washing, rinsing,  drying)?: A Lot Help from another person to put on and taking off regular upper body clothing?: A Lot Help from another person to put on and taking off regular lower body clothing?: Total 6 Click Score: 10   End of Session Equipment Utilized During Treatment: Gait belt;Oxygen Nurse Communication: Other (comment) (notified sitter of progress during session)  Activity Tolerance: Patient limited by lethargy Patient left: in bed;with nursing/sitter in room  OT Visit Diagnosis: Unsteadiness on feet (R26.81);Other abnormalities of gait and mobility (R26.89);Feeding difficulties (R63.3)                Time: 9767-3419 OT Time Calculation (min): 26 min Charges:  OT General Charges $OT Visit: 1 Visit OT Evaluation $OT Eval Low Complexity: 1 Low  Lars Pinks 05/19/2021, 12:58 PM

## 2021-05-20 DIAGNOSIS — R451 Restlessness and agitation: Secondary | ICD-10-CM

## 2021-05-20 DIAGNOSIS — Z789 Other specified health status: Secondary | ICD-10-CM

## 2021-05-20 DIAGNOSIS — G309 Alzheimer's disease, unspecified: Secondary | ICD-10-CM

## 2021-05-20 DIAGNOSIS — G928 Other toxic encephalopathy: Secondary | ICD-10-CM | POA: Diagnosis not present

## 2021-05-20 DIAGNOSIS — F0150 Vascular dementia without behavioral disturbance: Secondary | ICD-10-CM

## 2021-05-20 DIAGNOSIS — Z515 Encounter for palliative care: Secondary | ICD-10-CM

## 2021-05-20 DIAGNOSIS — I639 Cerebral infarction, unspecified: Secondary | ICD-10-CM | POA: Diagnosis not present

## 2021-05-20 DIAGNOSIS — N186 End stage renal disease: Secondary | ICD-10-CM | POA: Diagnosis not present

## 2021-05-20 DIAGNOSIS — I4891 Unspecified atrial fibrillation: Secondary | ICD-10-CM

## 2021-05-20 DIAGNOSIS — Z992 Dependence on renal dialysis: Secondary | ICD-10-CM

## 2021-05-20 DIAGNOSIS — R410 Disorientation, unspecified: Secondary | ICD-10-CM | POA: Diagnosis not present

## 2021-05-20 DIAGNOSIS — D696 Thrombocytopenia, unspecified: Secondary | ICD-10-CM | POA: Diagnosis not present

## 2021-05-20 DIAGNOSIS — G9341 Metabolic encephalopathy: Secondary | ICD-10-CM | POA: Diagnosis not present

## 2021-05-20 DIAGNOSIS — F0280 Dementia in other diseases classified elsewhere without behavioral disturbance: Secondary | ICD-10-CM

## 2021-05-20 LAB — GLUCOSE, CAPILLARY
Glucose-Capillary: 102 mg/dL — ABNORMAL HIGH (ref 70–99)
Glucose-Capillary: 109 mg/dL — ABNORMAL HIGH (ref 70–99)
Glucose-Capillary: 110 mg/dL — ABNORMAL HIGH (ref 70–99)
Glucose-Capillary: 99 mg/dL (ref 70–99)
Glucose-Capillary: 89 mg/dL (ref 70–99)

## 2021-05-20 LAB — TSH: TSH: 2.066 u[IU]/mL (ref 0.350–4.500)

## 2021-05-20 LAB — VITAMIN B12: Vitamin B-12: 523 pg/mL (ref 180–914)

## 2021-05-20 LAB — AMMONIA: Ammonia: 13 umol/L (ref 9–35)

## 2021-05-20 MED ORDER — LEVETIRACETAM IN NACL 1000 MG/100ML IV SOLN
1000.0000 mg | INTRAVENOUS | Status: AC
  Administered 2021-05-20: 1000 mg via INTRAVENOUS
  Filled 2021-05-20 (×2): qty 100

## 2021-05-20 MED ORDER — LEVETIRACETAM IN NACL 500 MG/100ML IV SOLN
500.0000 mg | Freq: Two times a day (BID) | INTRAVENOUS | Status: DC
  Administered 2021-05-21: 500 mg via INTRAVENOUS
  Filled 2021-05-20 (×2): qty 100

## 2021-05-20 MED ORDER — ORAL CARE MOUTH RINSE
15.0000 mL | Freq: Two times a day (BID) | OROMUCOSAL | Status: DC
  Administered 2021-05-20 – 2021-06-05 (×32): 15 mL via OROMUCOSAL

## 2021-05-20 MED ORDER — HEPARIN SODIUM (PORCINE) 5000 UNIT/ML IJ SOLN: 5000.0000 [IU] | Freq: Three times a day (TID) | INTRAMUSCULAR | Status: DC

## 2021-05-20 MED ORDER — HEPARIN SODIUM (PORCINE) 1000 UNIT/ML IJ SOLN
INTRAMUSCULAR | Status: AC
  Filled 2021-05-20: qty 1

## 2021-05-20 NOTE — Consult Note (Addendum)
Consultation Note Date: 05/20/2021   Patient Name: Brianna Dodson  DOB: 10-Apr-1934  MRN: [id number]  Age / Sex: 87 y.o., female  PCP: [name], MD Referring Physician: Fritzi Mandes, MD  Reason for Consultation: Establishing goals of care  HPI/Patient Profile: 87 y.o. female  with past medical history of A. fib, Alzheimer's and vascular dementia, multiple prior strokes with 1 large right MCA with residual left hemiparesis, ESRD, hep C, hyperlipidemia, diabetes type 2, HTN, and HLD admitted on 05/16/2021 with concerns of 2 to 3 days of increasing agitation and altered mental status.  Palliative medicine team was consulted to discuss goals of care.   Clinical Assessment and Goals of Care: I have reviewed medical records including EPIC notes, labs and imaging, received report from Dr. Posey Pronto, assessed the patient and then spoke with the patient's granddaughter over the telephone to discuss diagnosis prognosis, [place], EOL wishes, disposition and options.  I introduced Palliative Medicine as specialized medical care for people living with serious illness. It focuses on providing relief from the symptoms and stress of a serious illness. The goal is to improve quality of life for both the patient and the family.  We discussed a brief life review of the patient.  Granddaughter shares that she has been the patient's primary caregiver for many years now.  She shares there challenging family dynamics where the patient's husband and sister are intermittently involved in the patient's care.  As far as functional and nutritional status prior to admission granddaughter endorses the patient was able to feed herself, cook some meals, walk and participate in ADLs with minimal assistance.  Granddaughter ensured patient's safety at home and encourage as much independence as possible.  We discussed patient's current illness and  what it means in the larger context of patient's on-going co-morbidities.  Natural disease trajectory and expectations at EOL were discussed.  Granddaughter has an understanding the patient has dementia which is a progressive and nonreversible disease.  However, granddaughter shared she does not want to give up on her grandmother.  I attempted to elicit values and goals of care important to the patient.  Granddaughter shares that at some point when the patient's mentation was clear and she was fully oriented she and her granddaughter discussed life support.  Granddaughter shares that the patient was very clear in stating that she did not want to be resuscitated and live on a ventilator.  The difference between aggressive medical intervention and comfort care was considered in light of the patient's goals of care. Advance directives, concepts specific to code status, artificial feeding and hydration, and rehospitalization were considered and discussed.  Granddaughter shares there is no medical healthcare power of attorney.  I shared that we will need to speak with the patient's husband who is technically the legal decision maker for the patient at this point.  Should he refuse to make decisions then we would move onto next of kin.  I assured her that we would listen and take her suggestions and concerns into consideration as  we move forward in taking care of the patient.  I spoke with the patient's husband over the telephone today.  He reported he would be at the hospital between 1 and 2 PM.  I shared that I would have the granddaughter join [place] during this time to discuss goals of care for the patient.  Discussed with patient/family the importance of continued conversation with family and the medical providers regarding overall plan of care and treatment options, ensuring decisions are within the context of the patient's values and GOCs.    Questions and concerns were addressed. The family was encouraged to  call with questions or concerns.   Afternoon update:  During my afternoon rounds, I found the patient's husband Brianna Dodson was at bedside.  He shared that he did not really understand what was going on with her or had not received updates about her medical condition.  Discussed brief overview of patient's current medical condition including vascular and Alzheimer's dementia, prior strokes, renal failure, and recent decline over the past several days.  Also introduced palliative medicine as stated above.  Brianna Dodson shares that the granddaughter involved in the patient's care is not the patient's biological granddaughter.  This granddaughter is Brianna Dodson.  I reviewed CODE STATUS, artificial hydration and nutrition, and end-of-life.  Risks and benefits and poor prognostic factors discussed in regards to PEG tube placement.   I highlighted that as the patient's legal spouse that he responsibility for making her medical decisions lives with him.  He shares that while they do live in the same home they do not communicate and have not spoken over 3 years.  He reports they have been married for over 30 years and that she has not forgiven him for something that happened 20 years ago.  He shares he hates seeing her like this and wants her to get better.  I shared that I am concerned that she will not improve since dementia is a progressive and chronic illness.  He asked about what we could do to improve her kidneys and I shared that her dialysis treatments could continue but that would not reverse the underlying issues of her mental decline.  Human mortality was discussed.  He shared that everything is in God's hands.  He wants [place] to do everything possible to keep her alive.  I shared that perhaps this would be intervening with God's plans and not in the best interest of the patient given that she has multiple comorbidities and her overall prognosis is poor.  He again stated he just wants  to keep her alive.  CODE STATUS is to remain full code.  Brianna Dodson was appreciative of all of this information.  He suggested that the medical team speak with the patient's sister who he is on good terms with.  He offered no further questions or concerns.  I I shared with him that I am off service after today and will return on Monday.  Palliative medicine will continue to monitor the patient and support her and her family throughout this hospitalization.  Primary Decision Maker NEXT OF KIN  Code Status/Advance Care Planning: Full code  Prognosis:   Unable to determine  Discharge Planning: To Be Determined  Primary Diagnoses: Present on Admission:  Agitation  Atrial fibrillation ([place])  Essential hypertension  Mixed Alzheimer's and vascular dementia ([place])  Type II diabetes mellitus with renal manifestations ([place])  Thrombocytopenia ([place])   Physical Exam Vitals and nursing note reviewed.  Constitutional:  Appearance: Normal appearance.  HENT:     Head: Normocephalic and atraumatic.     Mouth/Throat:     Mouth: Mucous membranes are moist.  Cardiovascular:     Rate and Rhythm: Normal rate.  Pulmonary:     Effort: Pulmonary effort is normal.  Abdominal:     Palpations: Abdomen is soft.  Musculoskeletal:     Comments: Generalized weakness, bedbound  Skin:    General: Skin is warm and dry.  Neurological:     Comments: Nonverbal, pt stares at ceiling    Vital Signs: BP 133/65 (BP Location: Right Arm)   Pulse 78   Temp 97.7 F (36.5 C)   Resp 16   Ht 5\' 5"  (1.651 m)   Wt 60.4 kg   SpO2 100%   BMI 22.16 kg/m  Pain Scale: Faces   Pain Score: 0-No pain SpO2: SpO2: 100 % O2 Device:SpO2: 100 % O2 Flow Rate: .O2 Flow Rate (L/min): 1 L/min  Palliative Assessment/Data: 40%     I discussed this patient's plan of care with Dr. Posey Pronto, Dr. Abigail Butts, Dr. Rory Percy, patient's grand daughter, patient's husband.  Thank you for this consult. Palliative medicine will  continue to follow and assist holistically.   Time Total: 70 minutes Greater than 50%  of this time was spent counseling and coordinating care related to the above assessment and plan.  Signed by: Brianna Hawks, DNP, FNP-BC Palliative Medicine    Please contact Palliative Medicine Team phone at [phone] for questions and concerns.  For individual provider: See Shea Evans

## 2021-05-20 NOTE — Progress Notes (Signed)
[facility]  ROUNDING NOTE   Subjective:   Seen and examined on hemodialysis treatment.   Not responding.    Objective:  Vital signs in last 24 hours:  Temp:  [98.2 F (36.8 C)-98.9 F (37.2 C)] 98.5 F (36.9 C) (11/18 0925) Pulse Rate:  [73-79] 79 (11/18 0825) Resp:  [13-19] 16 (11/18 1100) BP: (100-153)/(64-77) 100/65 (11/18 1100) SpO2:  [97 %-100 %] 97 % (11/18 0825)  Weight change:  Filed Weights   05/17/21 1251 05/18/21 1545  Weight: 66 kg 60.4 kg    Intake/Output: No intake/output data recorded.   Intake/Output this shift:  No intake/output data recorded.  Physical Exam: General: Laying in bed  Head: Normocephalic, atraumatic. Moist oral mucosal membranes  Eyes: Anicteric, PERRL  Neck: Supple, trachea midline  Lungs:  Clear to auscultation  Heart: Regular rate and rhythm  Abdomen:  Soft, nontender,   Extremities:  No peripheral edema.  Neurologic: Somnolent. Responds to painful stimuli  Skin: No lesions  Access: Right IJ permcath    Basic Metabolic Panel: Recent Labs  Lab 05/16/21 2207 05/18/21 1545  NA 136 140  K 4.6 4.7  CL 95* 101  CO2 32 30  GLUCOSE 195* 133*  BUN 17 35*  CREATININE 2.54* 3.82*  CALCIUM 8.3* 9.3  PHOS  --  5.4*     Liver Function Tests: Recent Labs  Lab 05/16/21 2207 05/18/21 1545  AST 45*  --   ALT 22  --   ALKPHOS 66  --   BILITOT 1.1  --   PROT 7.9  --   ALBUMIN 3.8 3.4*    No results for input(s): LIPASE, AMYLASE in the last 168 hours. No results for input(s): AMMONIA in the last 168 hours.  CBC: Recent Labs  Lab 05/16/21 2300 05/18/21 1545  WBC 3.5* 5.7  NEUTROABS 1.8  --   HGB 13.7 13.6  HCT 40.4 40.2  MCV 87.6 86.3  PLT 87* 99*     Cardiac Enzymes: No results for input(s): CKTOTAL, CKMB, CKMBINDEX, TROPONINI in the last 168 hours.  BNP: Invalid input(s): POCBNP  CBG: Recent Labs  Lab 05/19/21 1140 05/19/21 1630 05/19/21 2054 05/20/21 0305 05/20/21 0809  GLUCAP  119* 93 95 102* 109*     Microbiology: Results for orders placed or performed during the hospital encounter of 05/16/21  Resp Panel by RT-PCR (Flu A&B, Covid) Nasopharyngeal Swab     Status: None   Collection Time: 05/16/21 10:07 PM   Specimen: Nasopharyngeal Swab; Nasopharyngeal(NP) swabs in vial transport medium  Result Value Ref Range Status   SARS Coronavirus 2 by RT PCR NEGATIVE NEGATIVE Final    Comment: (NOTE) SARS-CoV-2 target nucleic acids are NOT DETECTED.  The SARS-CoV-2 RNA is generally detectable in upper respiratory specimens during the acute phase of infection. The lowest concentration of SARS-CoV-2 viral copies this assay can detect is 138 copies/mL. A negative result does not preclude SARS-Cov-2 infection and should not be used as the sole basis for treatment or other patient management decisions. A negative result may occur with  improper specimen collection/handling, submission of specimen other than nasopharyngeal swab, presence of viral mutation(s) within the areas targeted by this assay, and inadequate number of viral copies(<138 copies/mL). A negative result must be combined with clinical observations, patient history, and epidemiological information. The expected result is Negative.  Fact Sheet for Patients:  EntrepreneurPulse.com.au  Fact Sheet for Healthcare Providers:  IncredibleEmployment.be  This test is no t yet approved or cleared by the  [place] Architectural technologist and  has been authorized for detection and/or diagnosis of SARS-CoV-2 by FDA under an Print production planner (EUA). This EUA will remain  in effect (meaning this test can be used) for the duration of the COVID-19 declaration under Section 564(b)(1) of the Act, 21 U.S.C.section 360bbb-3(b)(1), unless the authorization is terminated  or revoked sooner.       Influenza A by PCR NEGATIVE NEGATIVE Final   Influenza B by PCR NEGATIVE NEGATIVE Final     Comment: (NOTE) The Xpert Xpress SARS-CoV-2/FLU/RSV plus assay is intended as an aid in the diagnosis of influenza from Nasopharyngeal swab specimens and should not be used as a sole basis for treatment. Nasal washings and aspirates are unacceptable for Xpert Xpress SARS-CoV-2/FLU/RSV testing.  Fact Sheet for Patients: EntrepreneurPulse.com.au  Fact Sheet for Healthcare Providers: IncredibleEmployment.be  This test is not yet approved or cleared by the [place] FDA and has been authorized for detection and/or diagnosis of SARS-CoV-2 by FDA under an Emergency Use Authorization (EUA). This EUA will remain in effect (meaning this test can be used) for the duration of the COVID-19 declaration under Section 564(b)(1) of the Act, 21 U.S.C. section 360bbb-3(b)(1), unless the authorization is terminated or revoked.  Performed at [facility], [address] [place], [place] [address]   Urine Culture     Status: Abnormal   Collection Time: 05/16/21 10:07 PM   Specimen: In/Out Cath Urine  Result Value Ref Range Status   Specimen Description   Final    IN/OUT CATH URINE Performed at [facility], [address], [place], [place] [address]    Special Requests   Final    NONE Performed at [facility], [address] [place], [place] [address]    Culture (A)  Final    >=100,000 COLONIES/mL GARDNERELLA VAGINALIS Standardized susceptibility testing for this organism is not available. 80,000 COLONIES/mL DIPHTHEROIDS(CORYNEBACTERIUM SPECIES)    Report Status 05/19/2021 FINAL  Final    Coagulation Studies: No results for input(s): LABPROT, INR in the last 72 hours.  Urinalysis: No results for input(s): COLORURINE, LABSPEC, PHURINE, GLUCOSEU, HGBUR, BILIRUBINUR, KETONESUR, PROTEINUR, UROBILINOGEN, NITRITE, LEUKOCYTESUR in the last 72 hours.  Invalid input(s): APPERANCEUR     Imaging: EEG adult  Result Date:  05/19/2021 Lora Havens, MD     05/19/2021  3:59 PM Patient Name: Brianna Dodson MRN: [id number] Epilepsy Attending: Lora Havens Referring Physician/Provider: Dr Fritzi Mandes Date: 05/19/2021 Duration: 25.15 mins Patient history: 87yo M with ams. EEG to evaluate for seizure Level of alertness: Awake AEDs during EEG study: None Technical aspects: This EEG study was done with scalp electrodes positioned according to the 10-20 International system of electrode placement. Electrical activity was acquired at a sampling rate of 500Hz  and reviewed with a high frequency filter of 70Hz  and a low frequency filter of 1Hz . EEG data were recorded continuously and digitally stored. Description: No clear posterior dominant rhythm was seen. EEG showed continuous generalized 5-7hz  theta slowing. There is also 2-3hz  delta slowing in left frontotemporal region. Physiologic photic driving was not seen during photic stimulation.  Hyperventilation was not performed.   ABNORMALITY - Continuous slow, generalized  and maximal left frontotemporal region IMPRESSION: This study is suggestive of cortical dysfunction arising from  left frontotemporal region, nonspecific etiology but could be secondary to underlying structural abnormality, post-ictal state. Additionally there is moderate diffuse encephalopathy, nonspecific etiology. No seizures or epileptiform discharges were seen throughout the recording. Priyanka [name]  Medications:    levETIRAcetam     [START ON 05/21/2021] levETIRAcetam      amLODipine  5 mg Oral Daily   apixaban  2.5 mg Oral BID   ascorbic acid  500 mg Oral Daily   atorvastatin  80 mg Oral Daily   carvedilol  6.25 mg Oral BID WC   Chlorhexidine Gluconate Cloth  6 each Topical Daily   Chlorhexidine Gluconate Cloth  6 each Topical Q0600   cloNIDine  0.1 mg Transdermal Weekly   ezetimibe  10 mg Oral Daily   ferrous gluconate  324 mg Oral Q1500   hydrALAZINE  50 mg Oral BID   linagliptin  5 mg Oral  Daily   losartan  100 mg Oral Daily   mouth rinse  15 mL Mouth Rinse BID   multivitamin  1 tablet Oral QHS   acetaminophen **OR** acetaminophen, labetalol, ondansetron **OR** ondansetron (ZOFRAN) IV, senna-docusate  Assessment/ Plan:  Brianna Dodson is a 87 y.o. black female with end stage renal disease on hemodialysis, hypertension, CVA, vascular dementia, hyperlipidemia, diabetes mellitus type II, atrial fibrillation who is admitted to [facility] on 05/16/2021 for Confusion [R41.0] Agitation [R45.1]   [facility] MWF [facility] [address] RIJ permcath 65.5kg   End Stage Renal Disease:  - hemodialysis treatment today.    Altered mental status:  - discontinue pregablin.  - No improvement after hemodialysis treatment - Appreciate     Hypertension: with hypertension urgency. Home regimen includes losartan, amlodipine, carvedilol, hydralazine. Blood pressure 113/68 - IV labatelol PRN    Anemia of chronic kidney disease: hemoglobin 13.6. Normocytic. Mircera as outpatient. Outpatient iron levels at goal on 9/7.     Secondary Hyperparathyroidism: PTH, calcium and phosphorus at goal on 11/2. Not currently on binders.   Overall prognosis is poor. Palliative care has been consulted. Will need family meeting for goals of care.    LOS: 3 Cisco Kindt 11/18/202211:23 AM

## 2021-05-20 NOTE — Consult Note (Addendum)
Neurology Consultation  Reason for Consult: AMS Referring Physician: Dr Fritzi Mandes, Hospitalist  CC: Altered mental status  History is obtained from: Chart review  HPI: Brianna Dodson is a 87 y.o. female past medical history of atrial fibrillation on anticoagulation, Alzheimer's and vascular dementia, multiple prior strokes with large one being in the right MCA territory with residual left hemiparesis, ESRD, hepatitis C, hyperlipidemia, diabetes, hypertension, hyperlipidemia admitted due to multiple preceding days of declining mentation and agitation.  She was evaluated in the emergency room admitted to the hospital service for further work-up.  She has been getting her dialysis but her mentation continues to be poor.  Initially in the emergency room she was agitated and required Ativan and Haldol but over the past couple of days, she has been more somnolent in spite of being regularly dialyzed.  An MRI of the brain was obtained which does not show any new strokes or bleeds.  An EEG was done yesterday that shows left-sided slowing due to underlying structural lesion versus postictal state-neurological consultation was obtained for further evaluation. Patient is unable to provide history at this time. She has been seen in the past by me and my team, most recently by my colleague Dr. Quinn Axe on 03/17/2021 for syncope versus TIA.   LKW: Multiple days ago tpa given?: n not in the window Premorbid modified Rankin scale (mRS): At least a 3  ROS: Unable to obtain due to altered mental status.   Past Medical History:  Diagnosis Date   A-fib ([place])    Alzheimer disease ([place])    Anemia    Carotid stenosis    Cerebral infarction ([place]) 04/12/2019   Multiple new foci of acute infarction affect the RIGHT hemisphere affecting the frontal, posterior frontal, posterior temporal, anterior parietal cortex and regional white matter.   Cerebral infarction due to embolism of right middle cerebral artery ([place])  08/20/2019   RIGHT MCA distal M1   Chronic anticoagulation    Apixaban   CKD (chronic kidney disease), stage 4(HCC)    Embolic stroke involving cerebral artery ([place]) 03/15/2019   RIGHT MCA/PCA territory   Encephalomalacia    RIGHT posterior MCA territory   HCV (hepatitis C virus)    History of blood transfusion    Hyperlipidemia    Hypertension    OSA (obstructive sleep apnea)    Status post placement of implantable loop recorder 07/30/2019   T2DM (type 2 diabetes mellitus) ([place])     Family History  Problem Relation Age of Onset   Stroke Mother    Diabetes Mother    Heart disease Father    Kidney disease Sister    Diabetes Sister    Kidney disease Brother    Heart disease Sister    Diabetes Sister    Diabetes Sister    Diabetes Sister    Kidney disease Sister    Heart disease Sister    Kidney disease Brother        kidney transplant   Early death Brother 4       Truck Accident - died   Heart disease Brother      Social History:   reports that she has never smoked. She has never used smokeless tobacco. She reports that she does not drink alcohol and does not use drugs.  Medications  Current Facility-Administered Medications:    acetaminophen (TYLENOL) tablet 650 mg, 650 mg, Oral, Q6H PRN **OR** acetaminophen (TYLENOL) suppository 650 mg, 650 mg, Rectal, Q6H PRN, Athena Masse, MD  amLODipine (NORVASC) tablet 5 mg, 5 mg, Oral, Daily, Fritzi Mandes, MD   apixaban (ELIQUIS) tablet 2.5 mg, 2.5 mg, Oral, BID, Fritzi Mandes, MD   ascorbic acid (VITAMIN C) tablet 500 mg, 500 mg, Oral, Daily, Fritzi Mandes, MD   atorvastatin (LIPITOR) tablet 80 mg, 80 mg, Oral, Daily, Fritzi Mandes, MD   carvedilol (COREG) tablet 6.25 mg, 6.25 mg, Oral, BID WC, Fritzi Mandes, MD   Chlorhexidine Gluconate Cloth 2 % PADS 6 each, 6 each, Topical, Daily, Athena Masse, MD   Chlorhexidine Gluconate Cloth 2 % PADS 6 each, 6 each, Topical, Q0600, Lavonia Dana, MD, 6 each at 05/20/21 0502    cloNIDine (CATAPRES - Dosed in mg/24 hr) patch 0.1 mg, 0.1 mg, Transdermal, Weekly, Fritzi Mandes, MD   ezetimibe (ZETIA) tablet 10 mg, 10 mg, Oral, Daily, Fritzi Mandes, MD   ferrous gluconate (FERGON) tablet 324 mg, 324 mg, Oral, Q1500, Fritzi Mandes, MD   hydrALAZINE (APRESOLINE) tablet 50 mg, 50 mg, Oral, BID, Fritzi Mandes, MD   labetalol (NORMODYNE) injection 10 mg, 10 mg, Intravenous, Q2H PRN, Fritzi Mandes, MD, 10 mg at 05/18/21 2563   linagliptin (TRADJENTA) tablet 5 mg, 5 mg, Oral, Daily, Fritzi Mandes, MD   losartan (COZAAR) tablet 100 mg, 100 mg, Oral, Daily, Fritzi Mandes, MD   MEDLINE mouth rinse, 15 mL, Mouth Rinse, BID, Fritzi Mandes, MD, 15 mL at 05/20/21 0119   multivitamin (RENA-VIT) tablet 1 tablet, 1 tablet, Oral, QHS, Fritzi Mandes, MD   ondansetron (ZOFRAN) tablet 4 mg, 4 mg, Oral, Q6H PRN **OR** ondansetron (ZOFRAN) injection 4 mg, 4 mg, Intravenous, Q6H PRN, Athena Masse, MD   senna-docusate (Senokot-S) tablet 1 tablet, 1 tablet, Oral, QHS PRN, Fritzi Mandes, MD  Exam: Current vital signs: BP (!) 144/73   Pulse 79   Temp 98.5 F (36.9 C) (Oral)   Resp 13   Ht 5' 5"  (1.651 m)   Wt 60.4 kg   SpO2 97%   BMI 22.16 kg/m  Vital signs in last 24 hours: Temp:  [98.2 F (36.8 C)-98.9 F (37.2 C)] 98.5 F (36.9 C) (11/18 0925) Pulse Rate:  [73-79] 79 (11/18 0825) Resp:  [13-18] 13 (11/18 0848) BP: (144-153)/(65-77) 144/73 (11/18 0930) SpO2:  [97 %-100 %] 97 % (11/18 0825) General: Extremely lethargic, no acute distress, seen in hemodialysis. HNT: Normocephalic atraumatic Lungs: Clear Cardiovascular: Regular rhythm Abdomen nondistended nontender Neurological exam Extremity lethargic, opens eyes to noxious simulation Does not follow commands Moans to noxious simulation Cranial nerves: Pupils are equal round reactive to light, seems to have a somewhat of a rightward gaze preference but blinks to threat inconsistently from both sides.  Edentulous-difficult ascertain facial  symmetry. Motor examination-to noxious stimulation, there is strong withdrawal and localization with right upper extremity.  Strong withdrawal of the right lower extremity to noxious simulation.  Weak withdrawal to noxious stimulation in the left upper and lower extremity with increased tone in the left upper and lower extremity. Sensory exam: As above Gait and coordination cannot be tested due to her mentation  Labs I have reviewed labs in epic and the results pertinent to this consultation are:  CBC    Component Value Date/Time   WBC 5.7 05/18/2021 1545   RBC 4.66 05/18/2021 1545   HGB 13.6 05/18/2021 1545   HGB 12.1 11/09/2013 1540   HCT 40.2 05/18/2021 1545   HCT 36.3 11/09/2013 1540   PLT 99 (L) 05/18/2021 1545   PLT 183 11/09/2013 1540   MCV 86.3 05/18/2021  1545   MCV 87 11/09/2013 1540   MCH 29.2 05/18/2021 1545   MCHC 33.8 05/18/2021 1545   RDW 13.1 05/18/2021 1545   RDW 14.0 11/09/2013 1540   LYMPHSABS 1.0 05/16/2021 2300   MONOABS 0.4 05/16/2021 2300   EOSABS 0.2 05/16/2021 2300   BASOSABS 0.0 05/16/2021 2300    CMP     Component Value Date/Time   NA 140 05/18/2021 1545   NA 142 11/10/2013 0522   K 4.7 05/18/2021 1545   K 4.6 11/10/2013 0522   CL 101 05/18/2021 1545   CL 110 (H) 11/10/2013 0522   CO2 30 05/18/2021 1545   CO2 27 11/10/2013 0522   GLUCOSE 133 (H) 05/18/2021 1545   GLUCOSE 70 11/10/2013 0522   BUN 35 (H) 05/18/2021 1545   BUN 22 (H) 11/10/2013 0522   CREATININE 3.82 (H) 05/18/2021 1545   CREATININE 1.12 11/10/2013 0522   CALCIUM 9.3 05/18/2021 1545   CALCIUM 8.4 (L) 11/10/2013 0522   PROT 7.9 05/16/2021 2207   PROT 9.0 (H) 11/09/2013 1540   ALBUMIN 3.4 (L) 05/18/2021 1545   ALBUMIN 3.8 11/09/2013 1540   AST 45 (H) 05/16/2021 2207   AST 29 11/09/2013 1540   ALT 22 05/16/2021 2207   ALT 27 11/09/2013 1540   ALKPHOS 66 05/16/2021 2207   ALKPHOS 59 11/09/2013 1540   BILITOT 1.1 05/16/2021 2207   BILITOT 0.2 11/09/2013 1540   GFRNONAA  11 (L) 05/18/2021 1545   GFRNONAA 47 (L) 11/10/2013 0522   GFRAA 27 (L) 08/22/2019 0449   GFRAA 54 (L) 11/10/2013 0522    Lipid Panel     Component Value Date/Time   CHOL 110 02/26/2021 0502   TRIG 68 02/26/2021 0502   HDL 47 02/26/2021 0502   CHOLHDL 2.3 02/26/2021 0502   VLDL 14 02/26/2021 0502   LDLCALC 49 02/26/2021 0502   LDLDIRECT 98.0 01/24/2018 0912  Urinalysis with moderate leuk esterase.  Culture with diphtheroids-presuming they are contaminants.   Imaging I have reviewed the images obtained:  MRI brain-large chronic right MCA infarct with chronic blood products primarily involving the right frontoparietal lobes.  Small chronic right cerebellar infarct.  Other patchy and confluent areas of T2 hyperintensity in the supratentorial and pontine white matter nonspecific but likely reflective of stable chronic microvascular ischemic changes.  Scattered foci of susceptibility primarily involving the deep gray matter nuclei likely reflect chronic microhemorrhages secondary to hypertension.  Prominence of ventricles and sulci reflects volume loss which is stable compared to the prior scan.  Assessment:  87 year old with above past medical history admitted for altered mental status including difficulty taking care of herself as well as depressed level of consciousness and gradual general health decline as well as has been seen by neurology for possible acute on chronic strokes as well as TIA versus syncopal episodes in the past. Her current exam is reflective of moderate to severe encephalopathy. MRI of the brain does not show any acute stroke but shows large chronic right MCA stroke with chronic blood products, sequela of brain damage due to chronic hypertension and chronic microvascular ischemic changes as well as atrophy likely reflecting poor brain reserve. The EEG shows some left-sided slowing concerning for possible underlying structural lesion versus postictal state. There is no  report of seizure activity but it might be worthwhile to give her a trial of antiepileptics to see if that improves her mentation. In addition, continuing management of toxic metabolic derangements per primary team and nephrology would also be  helpful. That said, I suspect that she has been gradually worsening over the past few months to years and advanced age, multiple comorbidities including ESRD, prior big strokes, vascular plus Alzheimer's dementia amongst other things are eventually going to be catching up and making her neurological status worse than what it was a few months ago to a few years ago.    Impression: Multifactorial toxic metabolic encephalopathy Evaluate for seizures Cognitive decline due to progression of underlying mixed dementia in the setting of poor brain reserve.  Recommendations: Load with Keppra 1 g and start Keppra 500 twice daily given deranged renal function. Continue with correction of toxic metabolic derangements as you already are. Medical management per primary team as you are I do not suspect this to be a CNS infection hence I am not inclined to perform a spinal tap. I will check B12, TSH, ammonia levels. I agree with the nephrology assessment from Dr. Juleen China that her overall prognosis given the myriad of comorbidities appears to be poor.  We appreciate palliative medicine consultation in her care. I will reevaluate her after she has received a couple of doses of the antiepileptic. I have discussed my plan with Dr. Posey Pronto and Dr. Juleen China as well as K. Strup, NP from [facility]. Will follow.  -- Amie Portland, MD Neurologist [facility] Pager: [phone]

## 2021-05-20 NOTE — Plan of Care (Signed)
  Problem: Clinical Measurements: Goal: Ability to maintain clinical measurements within normal limits will improve Outcome: Progressing Goal: Will remain free from infection Outcome: Progressing Goal: Diagnostic test results will improve Outcome: Progressing Goal: Respiratory complications will improve Outcome: Progressing Goal: Cardiovascular complication will be avoided Outcome: Progressing   Pt is awake, calm but does not follow commands. Pt does speak but incomprehensibly. V/S stable. Kept on oxygen sat at 100% on RA.

## 2021-05-20 NOTE — Progress Notes (Signed)
[facility] at [place] NAME: Brianna Dodson    MR#:  [id number]  DATE OF BIRTH:  [birth year]  SUBJECTIVE:    Distant family relative at bedside  Pt looking up in the ceiling. Non verbal.tolerated HD today Not eating  REVIEW OF SYSTEMS:   Review of Systems  Unable to perform ROS: Mental status change  Tolerating Diet:no Tolerating PT:   DRUG ALLERGIES:   Allergies  Allergen Reactions   Nsaids     CKD stage III - Avoid all nephrotoxic drugs    VITALS:  Blood pressure 119/66, pulse 74, temperature 98.2 F (36.8 C), resp. rate 16, height 5\' 5"  (1.651 m), weight 60.4 kg, SpO2 98 %.  PHYSICAL EXAMINATION:   Physical Examlimited due to AMS  GENERAL:  88 y.o.-year-old patient lying in the bed with no acute distress.  HEENT: Head atraumatic, normocephalic. Oropharynx and nasopharynx clear.  LUNGS: Normal breath sounds bilaterally, no wheezing, rales, rhonchi. No use of accessory muscles of respiration.  CARDIOVASCULAR: S1, S2 normal. No murmurs, rubs, or gallops.  ABDOMEN: Soft, nontender, nondistended.  EXTREMITIES: No cyanosis, clubbing or edema b/l.    NEUROLOGIC: nonfocal--non verbal PSYCHIATRIC:  patient is awake but non communicative SKIN: per RN  LABORATORY PANEL:  CBC Recent Labs  Lab 05/18/21 1545  WBC 5.7  HGB 13.6  HCT 40.2  PLT 99*     Chemistries  Recent Labs  Lab 05/16/21 2207 05/18/21 1545  NA 136 140  K 4.6 4.7  CL 95* 101  CO2 32 30  GLUCOSE 195* 133*  BUN 17 35*  CREATININE 2.54* 3.82*  CALCIUM 8.3* 9.3  AST 45*  --   ALT 22  --   ALKPHOS 66  --   BILITOT 1.1  --     Cardiac Enzymes No results for input(s): TROPONINI in the last 168 hours. RADIOLOGY:  EEG adult  Result Date: 05/19/2021 Brianna Havens, MD     05/19/2021  3:59 PM Patient Name: Brianna Dodson MRN: [id number] Epilepsy Attending: [name] Referring Physician/Provider: Dr Fritzi Mandes Date: 05/19/2021 Duration: 25.15  mins Patient history: 87yo M with ams. EEG to evaluate for seizure Level of alertness: Awake AEDs during EEG study: None Technical aspects: This EEG study was done with scalp electrodes positioned according to the 10-20 International system of electrode placement. Electrical activity was acquired at a sampling rate of 500Hz  and reviewed with a high frequency filter of 70Hz  and a low frequency filter of 1Hz . EEG data were recorded continuously and digitally stored. Description: No clear posterior dominant rhythm was seen. EEG showed continuous generalized 5-7hz  theta slowing. There is also 2-3hz  delta slowing in left frontotemporal region. Physiologic photic driving was not seen during photic stimulation.  Hyperventilation was not performed.   ABNORMALITY - Continuous slow, generalized  and maximal left frontotemporal region IMPRESSION: This study is suggestive of cortical dysfunction arising from  left frontotemporal region, nonspecific etiology but could be secondary to underlying structural abnormality, post-ictal state. Additionally there is moderate diffuse encephalopathy, nonspecific etiology. No seizures or epileptiform discharges were seen throughout the recording. Brianna Dodson   ASSESSMENT AND PLAN:   Brianna Dodson is a 87 y.o. female with medical history significant for ESRD on HD MWF, anemia of CKD, paroxysmal A. fib on Eliquis, DM, Alzheimer's disease, OSA, HTN, DM, recurrent strokes, who was brought in by EMS for agitation and altered mental status.  EMS had been called out to the home 3  times in the past few days for altered mental status but family declined transport to the emergency room.  Patient went to dialysis earlier in the day but became agitated on her arrival back home and this time family agreed for her to be brought in.    Chest x-ray nonacute Head CT with chronic right MCA infarct chronic atrophic and ischemic changes.  Acute encephalopathy suspected metabolic with malignant  hypertension Alzheimer's dementia along with vascular dementia acute delirium-- unclear etiology -- IV PRN labetalol -- clonidine patch -- patient not awake enough to take her multiple PO blood pressure meds -- per family patient started getting agitated/delirious after she came back from dialysis yesterday -- in the ER patient was lethargic. Unable to hold any conversation. -- CT head negative for acute abnormality. Shows chronic stroke and chronic small vessel ischemic changes. -- Will try get MRI of the brain-- discussed with granddaughter on the phone -- PRN Haldol --11/16--received ativan earlier for MRI brain--Neg for acute CVA --11/17--pt awake but non verbal. Remains calm.not eating. RN unable to give po meds --not on any sedating meds. --overall declining in the last 2-3 days. Palliative care consult placed. --11/18-- no improvement in mental status -- EEG suggestive of cortical dysfunction arising from  left frontotemporal region, nonspecific etiology but could be secondary to underlying structural abnormality, post-ictal state. Additionally there is moderate diffuse encephalopathy, nonspecific etiology. No seizures or epileptiform discharges were seen throughout the recording --Dr Rory Percy recommends keppra trail to see if there is any improvement in MS. --Palliative care to speak/meet with family today.  End-stage renal disease on hemodialysis Monday Wednesday Friday -- nephrology consultation with Dr. Juleen China --tolerating HD  type II diabetes with renal failure -- d/c SSI --cont sugar checks 5 times a day -- A1c 5.5  history of CVA with vascular dementia -- 11/16--MRI of the brain--no acute CVA, Chronic CVA's  Hyperlipidemia --not taking any po meds  HTN --not taking any po meds --cont clonidine patch and prn IV labetalol  Overall poor prognosis   Procedures: Family communication :dr Juleen China spoke with pt's sister 11/18 Consults : nephrology, Neurology CODE  STATUS: full DVT Prophylaxis : heparin Level of care: Med-Surg Status is: Inpatient  Remains inpatient appropriate because: workup for metabolic encephalopathy Overall declining. Palliative care to see pt   TOTAL TIME TAKING CARE OF THIS PATIENT:25 minutes.  >50% time spent on counselling and coordination of care  Note: This dictation was prepared with Dragon dictation along with smaller phrase technology. Any transcriptional errors that result from this process are unintentional.  Fritzi Mandes M.D    [facility]   CC: Primary care physician; [name], MD [name], female   DOB: [birth year], 87 y.o.   MRN: [id number]

## 2021-05-21 DIAGNOSIS — G9341 Metabolic encephalopathy: Secondary | ICD-10-CM | POA: Diagnosis not present

## 2021-05-21 DIAGNOSIS — R451 Restlessness and agitation: Secondary | ICD-10-CM | POA: Diagnosis not present

## 2021-05-21 DIAGNOSIS — N186 End stage renal disease: Secondary | ICD-10-CM | POA: Diagnosis not present

## 2021-05-21 DIAGNOSIS — R569 Unspecified convulsions: Secondary | ICD-10-CM

## 2021-05-21 DIAGNOSIS — I639 Cerebral infarction, unspecified: Secondary | ICD-10-CM | POA: Diagnosis not present

## 2021-05-21 DIAGNOSIS — I10 Essential (primary) hypertension: Secondary | ICD-10-CM | POA: Diagnosis not present

## 2021-05-21 LAB — GLUCOSE, CAPILLARY
Glucose-Capillary: 88 mg/dL (ref 70–99)
Glucose-Capillary: 93 mg/dL (ref 70–99)
Glucose-Capillary: 134 mg/dL — ABNORMAL HIGH (ref 70–99)
Glucose-Capillary: 122 mg/dL — ABNORMAL HIGH (ref 70–99)
Glucose-Capillary: 145 mg/dL — ABNORMAL HIGH (ref 70–99)

## 2021-05-21 MED ORDER — LEVETIRACETAM 500 MG PO TABS
500.0000 mg | ORAL_TABLET | Freq: Two times a day (BID) | ORAL | Status: DC
  Administered 2021-05-21 – 2021-06-02 (×22): 500 mg via ORAL
  Filled 2021-05-21 (×23): qty 1

## 2021-05-21 MED ORDER — LEVETIRACETAM 500 MG PO TABS: 500.0000 mg | ORAL_TABLET | Freq: Two times a day (BID) | ORAL | Status: DC

## 2021-05-21 NOTE — Progress Notes (Addendum)
Neurology Progress Note   S:// Patient seen and examined. Started on [place] yesterday More awake today. Was able to participate somewhat in exam today. Was able to tell me her name.    O:// Current vital signs: BP (!) 138/58 (BP Location: Right Arm)   Pulse 70   Temp 98.1 F (36.7 C) (Oral)   Resp 16   Ht _0  (1.651 m)   Wt 60.4 kg   SpO2 99%   BMI 22.16 kg/m  Vital signs in last 24 hours: Temp:  [97.5 F (36.4 C)-98.2 F (36.8 C)] 98.1 F (36.7 C) (11/19 0820) Pulse Rate:  [70-78] 70 (11/19 0820) Resp:  [14-23] 16 (11/19 0820) BP: (86-143)/(58-80) 138/58 (11/19 0820) SpO2:  [98 %-100 %] 99 % (11/19 0820) General: Awake alert in no distress HEENT: Normocephalic/atraumatic, edentulous Lungs: Clear Cardiovascular: Regular rhythm Abdomen nondistended nontender Neurological exam Awake, alert, wanted me to hand her a towel from the table in front of her. Moderately dysarthric Able to tell me her name Was able to name simple objects such as glasses, watch, pen.  Was able to repeat simple sentences-today is a sunny day. Was able to follow simple commands such as raising her arm, closing her eyes, opening her eyes. Cranial nerve examination: Pupils are equal round react light, extraocular movements appear intact-initially appeared she has a left gaze preference but upon having her track my finger she was able to look both sides without any difficulty.  Not sure if her blink to threat from left side is consistent but it was difficult to examine her due to her poor attention concentration.  Subtle left lower facial weakness but her edentulousness makes that also hard to ascertain. Motor examination with left upper extremity that is barely antigravity.  Left lower extremity 3/5.  Right side is full strength. Sensation: Grossly intact Coordination: Difficult to assess  Medications  Current Facility-Administered Medications:    acetaminophen (TYLENOL) tablet 650 mg, 650 mg,  Oral, Q6H PRN **OR** acetaminophen (TYLENOL) suppository 650 mg, 650 mg, Rectal, Q6H PRN, Athena Masse, MD   apixaban Arne [place]) tablet 2.5 mg, 2.5 mg, Oral, BID, Fritzi Mandes, MD, 2.5 mg at 05/21/21 1023   ascorbic acid (VITAMIN C) tablet 500 mg, 500 mg, Oral, Daily, Fritzi Mandes, MD, 500 mg at 05/21/21 1028   atorvastatin (LIPITOR) tablet 80 mg, 80 mg, Oral, Daily, Fritzi Mandes, MD, 80 mg at 05/21/21 1028   carvedilol (COREG) tablet 6.25 mg, 6.25 mg, Oral, BID WC, Fritzi Mandes, MD, 6.25 mg at 05/21/21 1023   Chlorhexidine Gluconate Cloth 2 % PADS 6 each, 6 each, Topical, Daily, Athena Masse, MD, 6 each at 05/21/21 1031   Chlorhexidine Gluconate Cloth 2 % PADS 6 each, 6 each, Topical, Q0600, Kolluru, Sarath, MD, 6 each at 05/21/21 0612   ezetimibe (ZETIA) tablet 10 mg, 10 mg, Oral, Daily, Fritzi Mandes, MD, 10 mg at 05/21/21 1029   ferrous gluconate (FERGON) tablet 324 mg, 324 mg, Oral, Q1500, Fritzi Mandes, MD   labetalol (NORMODYNE) injection 10 mg, 10 mg, Intravenous, Q2H PRN, Fritzi Mandes, MD, 10 mg at 05/18/21 0350   levETIRAcetam (KEPPRA) IVPB 500 mg/100 mL premix, 500 mg, Intravenous, Q12H, Amie Portland, MD, Stopped at 05/21/21 0135   losartan (COZAAR) tablet 100 mg, 100 mg, Oral, Daily, Fritzi Mandes, MD, 100 mg at 05/21/21 1023   MEDLINE mouth rinse, 15 mL, Mouth Rinse, BID, Fritzi Mandes, MD, 15 mL at 05/21/21 1031   multivitamin (RENA-VIT) tablet 1 tablet, 1 tablet, Oral,  Johnna Acosta, MD   ondansetron [facility]) tablet 4 mg, 4 mg, Oral, Q6H PRN **OR** ondansetron (ZOFRAN) injection 4 mg, 4 mg, Intravenous, Q6H PRN, Athena Masse, MD   senna-docusate (Senokot-S) tablet 1 tablet, 1 tablet, Oral, QHS PRN, Fritzi Mandes, MD Labs CBC    Component Value Date/Time   WBC 5.7 05/18/2021 1545   RBC 4.66 05/18/2021 1545   HGB 13.6 05/18/2021 1545   HGB 12.1 11/09/2013 1540   HCT 40.2 05/18/2021 1545   HCT 36.3 11/09/2013 1540   PLT 99 (L) 05/18/2021 1545   PLT 183 11/09/2013 1540   MCV 86.3  05/18/2021 1545   MCV 87 11/09/2013 1540   MCH 29.2 05/18/2021 1545   MCHC 33.8 05/18/2021 1545   RDW 13.1 05/18/2021 1545   RDW 14.0 11/09/2013 1540   LYMPHSABS 1.0 05/16/2021 2300   MONOABS 0.4 05/16/2021 2300   EOSABS 0.2 05/16/2021 2300   BASOSABS 0.0 05/16/2021 2300    CMP     Component Value Date/Time   NA 140 05/18/2021 1545   NA 142 11/10/2013 0522   K 4.7 05/18/2021 1545   K 4.6 11/10/2013 0522   CL 101 05/18/2021 1545   CL 110 (H) 11/10/2013 0522   CO2 30 05/18/2021 1545   CO2 27 11/10/2013 0522   GLUCOSE 133 (H) 05/18/2021 1545   GLUCOSE 70 11/10/2013 0522   BUN 35 (H) 05/18/2021 1545   BUN 22 (H) 11/10/2013 0522   CREATININE 3.82 (H) 05/18/2021 1545   CREATININE 1.12 11/10/2013 0522   CALCIUM 9.3 05/18/2021 1545   CALCIUM 8.4 (L) 11/10/2013 0522   PROT 7.9 05/16/2021 2207   PROT 9.0 (H) 11/09/2013 1540   ALBUMIN 3.4 (L) 05/18/2021 1545   ALBUMIN 3.8 11/09/2013 1540   AST 45 (H) 05/16/2021 2207   AST 29 11/09/2013 1540   ALT 22 05/16/2021 2207   ALT 27 11/09/2013 1540   ALKPHOS 66 05/16/2021 2207   ALKPHOS 59 11/09/2013 1540   BILITOT 1.1 05/16/2021 2207   BILITOT 0.2 11/09/2013 1540   GFRNONAA 11 (L) 05/18/2021 1545   GFRNONAA 47 (L) 11/10/2013 0522   GFRAA 27 (L) 08/22/2019 0449   GFRAA 54 (L) 11/10/2013 0522   B12 523 A1c 5.5 TSH 2.066 Ammonia 13  Imaging I have reviewed images in epic and the results pertinent to this consultation are: No new images today. MRI brain from this admission-large chronic right MCA infarct with chronic blood products primarily involving the right frontoparietal lobes.  Small chronic right cerebellar infarct.  Other patchy and confluent areas of T2 hyperintensity in the supratentorial and pontine white matter nonspecific but likely reflective of stable chronic microvascular ischemic changes.  Scattered foci of susceptibility primarily involving the deep gray matter nuclei likely reflect chronic microhemorrhages  secondary to hypertension.  Prominence of ventricles and sulci reflects volume loss which is stable compared to the prior scan.  Assessment: 87 year old with past medical history of atrial fibrillation on anticoagulation, Alzheimer's and vascular dementia, multiple prior strokes with large right MCA infarct with residual left hemiparesis, ESRD, otitis C, hyperlipidemia, diabetes, hypertension admitted for multiple days of declining mentation and admitted to medicine service for further evaluation.  Mentation continued to be poor despite receiving dialysis.  Had episodes of agitation requiring Ativan and Haldol. EEG was done that showed left hemispheric dysfunction concerning for structural abnormality versus postictal state. Given poor exam in terms of her level of consciousness and ability to follow commands yesterday, Keppra was added to  her list of medications and today she appears to be following commands, has left hemiparesis but is much more awake than what she was yesterday. Cannot totally rule out an underlying seizure with prolonged postictal state. Given her multiple comorbidities, extensive atrophy in the brain, encephalomalacia from prior strokes, I would suspect that she has baseline cognitive deficits and with other system comorbidities, her neurological status is much worse than what is to be expected for her age. Given large areas of encephalomalacia, and vascular dementia/mixed dementia, seizures are common in this group of patients and that is probably something that might of happened to make an acute change in her condition.  Impression: -Multifactorial toxic metabolic encephalopathy -Possible seizures - new onset in the setting of old strokes, dementia -Ongoing progressive cognitive deficits and decline in the setting of underlying mixed Alzheimer's/vascular dementia.  Recommendations: -I would continue Keppra 500 twice daily with additional 250 mg dose after dialysis on dialysis  days. -Maintain seizure precautions -Continue managing toxic metabolic derangements as you are. -Suspicion for CNS infection remains very low-no need for LP. -Blood work and labs do not show any reversible causes of dementia that need management at this time -Outpatient follow-up with neurology in 8 to 12 weeks after discharge  -As I had said before in my consultative note, I agree with nephrology assessment that her overall prognosis given her myriad of comorbidities is not exceptionally good and goals of care conversations should be clarified to prevent unnecessary difficulties to the patient and the family in the future as I do anticipate her to continue to deteriorate in terms of her cognition given her clinical history, imaging findings and examination  Plan discussed with primary hospitalist Dr. Posey Pronto and nephrologist Dr. Juleen China  Inpatient neurology will be available as needed.  Please call with questions.  -- Amie Portland, MD Neurologist [facility] Pager: [phone]

## 2021-05-21 NOTE — Progress Notes (Addendum)
[place] at [place] NAME: Brianna Dodson    MR#:  [id number]  DATE OF BIRTH:  1933-09-24  SUBJECTIVE:    No family at bedside  patient more awake and ate some breakfast earlier per RN. Patient took her meds. 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 REVIEW OF SYSTEMS:   Review of Systems  Unable to perform ROS: Mental status change  Tolerating Diet: yes Tolerating PT:   DRUG ALLERGIES:   Allergies  Allergen Reactions   Nsaids     CKD stage III - Avoid all nephrotoxic drugs    VITALS:  Blood pressure (!) 138/58, pulse 70, temperature 98.1 F (36.7 C), temperature source Oral, resp. rate 16, height 5\' 5"  (1.651 m), weight 60.4 kg, SpO2 99 %.  PHYSICAL EXAMINATION:   Physical Examlimited due to AMS  GENERAL:  87 y.o.-year-old patient lying in the bed with no acute distress.  HEENT: Head atraumatic, normocephalic. Oropharynx and nasopharynx clear.  LUNGS: Normal breath sounds bilaterally, no wheezing, rales, rhonchi. No use of accessory muscles of respiration.  CARDIOVASCULAR: S1, S2 normal. No murmurs, rubs, or gallops.  ABDOMEN: Soft, nontender, nondistended.  EXTREMITIES: No cyanosis, clubbing or edema b/l.    NEUROLOGIC: nonfocal- PSYCHIATRIC:  patient is awake and  communicative some SKIN: per RN  LABORATORY PANEL:  CBC Recent Labs  Lab 05/18/21 1545  WBC 5.7  HGB 13.6  HCT 40.2  PLT 99*     Chemistries  Recent Labs  Lab 05/16/21 2207 05/18/21 1545  NA 136 140  K 4.6 4.7  CL 95* 101  CO2 32 30  GLUCOSE 195* 133*  BUN 17 35*  CREATININE 2.54* 3.82*  CALCIUM 8.3* 9.3  AST 45*  --   ALT 22  --   ALKPHOS 66  --   BILITOT 1.1  --     Cardiac Enzymes No results for input(s): TROPONINI in the last 168 hours. RADIOLOGY:  EEG adult  Result Date: 05/19/2021 Lora Havens, MD     05/19/2021  3:59 PM Patient Name: Brianna Dodson MRN: [id number] Epilepsy Attending: Lora Havens Referring Physician/Provider: Dr Fritzi Mandes Date: 05/19/2021 Duration: 25.15 mins Patient history: 87yo M with ams. EEG to evaluate for seizure Level of alertness: Awake AEDs during EEG study: None Technical aspects: This EEG study was done with scalp electrodes positioned according to the 10-20 International system of electrode placement. Electrical activity was acquired at a sampling rate of 500Hz  and reviewed with a high frequency filter of 70Hz  and a low frequency filter of 1Hz . EEG data were recorded continuously and digitally stored. Description: No clear posterior dominant rhythm was seen. EEG showed continuous generalized 5-7hz  theta slowing. There is also 2-3hz  delta slowing in left frontotemporal region. Physiologic photic driving was not seen during photic stimulation.  Hyperventilation was not performed.   ABNORMALITY - Continuous slow, generalized  and maximal left frontotemporal region IMPRESSION: This study is suggestive of cortical dysfunction arising from  left frontotemporal region, nonspecific etiology but could be secondary to underlying structural abnormality, post-ictal state. Additionally there is moderate diffuse encephalopathy, nonspecific etiology. No seizures or epileptiform discharges were seen throughout the recording. Brianna Dodson   ASSESSMENT AND PLAN:   Brianna Dodson is a 87 y.o. female with medical history significant for ESRD on HD MWF, anemia of CKD, paroxysmal A. fib on Eliquis, DM, Alzheimer's disease, OSA, HTN, DM, recurrent strokes, who was brought in by EMS for agitation and altered mental  status.  EMS had been called out to the home 3 times in the past few days for altered mental status but family declined transport to the emergency room.  Patient went to dialysis earlier in the day but became agitated on her arrival back home and this time family agreed for her to be brought in.    Chest x-ray nonacute Head CT with chronic right MCA infarct chronic atrophic and ischemic changes.  Acute encephalopathy  suspected metabolic with malignant hypertension Alzheimer's dementia along with vascular dementia acute delirium-- unclear etiology -- IV PRN labetalol -- clonidine patch -- patient not awake enough to take her multiple PO blood pressure meds -- per family patient started getting agitated/delirious after she came back from dialysis yesterday -- in the ER patient was lethargic. Unable to hold any conversation. -- CT head negative for acute abnormality. Shows chronic stroke and chronic small vessel ischemic changes. -- Will try get MRI of the brain-- discussed with granddaughter on the phone -- PRN Haldol --11/16--received ativan earlier for MRI brain--Neg for acute CVA --11/17--pt awake but non verbal. Remains calm.not eating. RN unable to give po meds --not on any sedating meds. --overall declining in the last 2-3 days. Palliative care consult placed. --11/18-- no improvement in mental status -- EEG suggestive of cortical dysfunction arising from  left frontotemporal region, nonspecific etiology but could be secondary to underlying structural abnormality, post-ictal state. Additionally there is moderate diffuse encephalopathy, nonspecific etiology. No seizures or epileptiform discharges were seen throughout the recording --Dr Rory Percy recommends keppra trial to see if there is any improvement in MS. --Palliative care to speak/meet with family today. --11/19-- patient more awake. Ate breakfast. Taking meds. Per RN lost her IV.  End-stage renal disease on hemodialysis Monday Wednesday Friday -- nephrology consultation with Dr. Juleen China --tolerating HD  type II diabetes with renal failure -- d/c SSI --cont sugar checks 5 times a day -- A1c 5.5  history of CVA with vascular dementia -- 11/16--MRI of the brain--no acute CVA, Chronic CVA's  Hyperlipidemia --not taking any po meds  HTN --not taking any po meds --cont clonidine patch and prn IV labetalol  Overall poor  prognosis 11/18-palliative care spoke with Ermalene Searing patient's husband wants everything done. Patient is a full code  Procedures: Family communication :dr Juleen China spoke with pt's sister 11/18 Consults : nephrology, Neurology CODE STATUS: full DVT Prophylaxis : heparin Level of care: Med-Surg Status is: Inpatient  Remains inpatient appropriate because: workup for metabolic encephalopathy    TOTAL TIME TAKING CARE OF THIS PATIENT:25 minutes.  >50% time spent on counselling and coordination of care  Note: This dictation was prepared with Dragon dictation along with smaller phrase technology. Any transcriptional errors that result from this process are unintentional.  Fritzi Mandes M.D    [facility]   CC: Primary care physician; [name], MD [name], female   DOB: 02-Apr-1934, 87 y.o.   MRN: [id number]

## 2021-05-21 NOTE — Progress Notes (Signed)
IV line removed by patient. IV team consult placed. IV nurse assessed and attempted to get an IV with no success. MD Posey Pronto made aware and new orders for kepra PO were placed. Per MD ok to leave iv out for now. Patient was able to take pills today.

## 2021-05-21 NOTE — Progress Notes (Signed)
[facility]  ROUNDING NOTE   Subjective:   Hemodialysis treatment yesterday. Tolerated treatment. UF of 528mL.  More awake this morning. Eating her breakfast on her own.   Keppra initiated today.   Patient is oriented to self. Asks to go home.    Objective:  Vital signs in last 24 hours:  Temp:  [97.5 F (36.4 C)-98.2 F (36.8 C)] 98.1 F (36.7 C) (11/19 0820) Pulse Rate:  [70-78] 70 (11/19 0820) Resp:  [14-23] 16 (11/19 0820) BP: (86-143)/(58-80) 138/58 (11/19 0820) SpO2:  [98 %-100 %] 99 % (11/19 0820)  Weight change:  Filed Weights   05/17/21 1251 05/18/21 1545  Weight: 66 kg 60.4 kg    Intake/Output: I/O last 3 completed shifts: In: 197 [IV Piggyback:197] Out: 500 [Other:500]   Intake/Output this shift:  Total I/O In: 180 [P.O.:180] Out: -   Physical Exam: General: Laying in bed  Head: Normocephalic, atraumatic. Moist oral mucosal membranes  Eyes: Anicteric, PERRL  Neck: Supple, trachea midline  Lungs:  Clear to auscultation  Heart: Regular rate and rhythm  Abdomen:  Soft, nontender,   Extremities:  No peripheral edema.  Neurologic: Alert to self  Skin: No lesions  Access: Right IJ permcath    Basic Metabolic Panel: Recent Labs  Lab 05/16/21 2207 05/18/21 1545  NA 136 140  K 4.6 4.7  CL 95* 101  CO2 32 30  GLUCOSE 195* 133*  BUN 17 35*  CREATININE 2.54* 3.82*  CALCIUM 8.3* 9.3  PHOS  --  5.4*     Liver Function Tests: Recent Labs  Lab 05/16/21 2207 05/18/21 1545  AST 45*  --   ALT 22  --   ALKPHOS 66  --   BILITOT 1.1  --   PROT 7.9  --   ALBUMIN 3.8 3.4*    No results for input(s): LIPASE, AMYLASE in the last 168 hours. Recent Labs  Lab 05/20/21 1128  AMMONIA 13    CBC: Recent Labs  Lab 05/16/21 2300 05/18/21 1545  WBC 3.5* 5.7  NEUTROABS 1.8  --   HGB 13.7 13.6  HCT 40.4 40.2  MCV 87.6 86.3  PLT 87* 99*     Cardiac Enzymes: No results for input(s): CKTOTAL, CKMB, CKMBINDEX, TROPONINI in the  last 168 hours.  BNP: Invalid input(s): POCBNP  CBG: Recent Labs  Lab 05/20/21 1309 05/20/21 1702 05/20/21 2055 05/21/21 0250 05/21/21 0813  GLUCAP 110* 99 89 88 93     Microbiology: Results for orders placed or performed during the hospital encounter of 05/16/21  Resp Panel by RT-PCR (Flu A&B, Covid) Nasopharyngeal Swab     Status: None   Collection Time: 05/16/21 10:07 PM   Specimen: Nasopharyngeal Swab; Nasopharyngeal(NP) swabs in vial transport medium  Result Value Ref Range Status   SARS Coronavirus 2 by RT PCR NEGATIVE NEGATIVE Final    Comment: (NOTE) SARS-CoV-2 target nucleic acids are NOT DETECTED.  The SARS-CoV-2 RNA is generally detectable in upper respiratory specimens during the acute phase of infection. The lowest concentration of SARS-CoV-2 viral copies this assay can detect is 138 copies/mL. A negative result does not preclude SARS-Cov-2 infection and should not be used as the sole basis for treatment or other patient management decisions. A negative result may occur with  improper specimen collection/handling, submission of specimen other than nasopharyngeal swab, presence of viral mutation(s) within the areas targeted by this assay, and inadequate number of viral copies(<138 copies/mL). A negative result must be combined with clinical observations, patient  history, and epidemiological information. The expected result is Negative.  Fact Sheet for Patients:  EntrepreneurPulse.com.au  Fact Sheet for Healthcare Providers:  IncredibleEmployment.be  This test is no t yet approved or cleared by the [place] FDA and  has been authorized for detection and/or diagnosis of SARS-CoV-2 by FDA under an Emergency Use Authorization (EUA). This EUA will remain  in effect (meaning this test can be used) for the duration of the COVID-19 declaration under Section 564(b)(1) of the Act, 21 U.S.C.section 360bbb-3(b)(1), unless the  authorization is terminated  or revoked sooner.       Influenza A by PCR NEGATIVE NEGATIVE Final   Influenza B by PCR NEGATIVE NEGATIVE Final    Comment: (NOTE) The Xpert Xpress SARS-CoV-2/FLU/RSV plus assay is intended as an aid in the diagnosis of influenza from Nasopharyngeal swab specimens and should not be used as a sole basis for treatment. Nasal washings and aspirates are unacceptable for Xpert Xpress SARS-CoV-2/FLU/RSV testing.  Fact Sheet for Patients: EntrepreneurPulse.com.au  Fact Sheet for Healthcare Providers: IncredibleEmployment.be  This test is not yet approved or cleared by the [place] FDA and has been authorized for detection and/or diagnosis of SARS-CoV-2 by FDA under an Emergency Use Authorization (EUA). This EUA will remain in effect (meaning this test can be used) for the duration of the COVID-19 declaration under Section 564(b)(1) of the Act, 21 U.S.C. section 360bbb-3(b)(1), unless the authorization is terminated or revoked.  Performed at [facility], [address] [place], [place] [address]   Urine Culture     Status: Abnormal   Collection Time: 05/16/21 10:07 PM   Specimen: In/Out Cath Urine  Result Value Ref Range Status   Specimen Description   Final    IN/OUT CATH URINE Performed at [facility], [address] [place], [place] [address]    Special Requests   Final    NONE Performed at [facility], [address] [place], [place] [address]    Culture (A)  Final    >=100,000 COLONIES/mL GARDNERELLA VAGINALIS Standardized susceptibility testing for this organism is not available. 80,000 COLONIES/mL DIPHTHEROIDS(CORYNEBACTERIUM SPECIES)    Report Status 05/19/2021 FINAL  Final    Coagulation Studies: No results for input(s): LABPROT, INR in the last 72 hours.  Urinalysis: No results for input(s): COLORURINE, LABSPEC, PHURINE, GLUCOSEU, HGBUR, BILIRUBINUR,  KETONESUR, PROTEINUR, UROBILINOGEN, NITRITE, LEUKOCYTESUR in the last 72 hours.  Invalid input(s): APPERANCEUR     Imaging: EEG adult  Result Date: 05/19/2021 Lora Havens, MD     05/19/2021  3:59 PM Patient Name: Brianna Dodson MRN: [id number] Epilepsy Attending: Lora Havens Referring Physician/Provider: Dr Fritzi Mandes Date: 05/19/2021 Duration: 25.15 mins Patient history: 87yo M with ams. EEG to evaluate for seizure Level of alertness: Awake AEDs during EEG study: None Technical aspects: This EEG study was done with scalp electrodes positioned according to the 10-20 International system of electrode placement. Electrical activity was acquired at a sampling rate of 500Hz  and reviewed with a high frequency filter of 70Hz  and a low frequency filter of 1Hz . EEG data were recorded continuously and digitally stored. Description: No clear posterior dominant rhythm was seen. EEG showed continuous generalized 5-7hz  theta slowing. There is also 2-3hz  delta slowing in left frontotemporal region. Physiologic photic driving was not seen during photic stimulation.  Hyperventilation was not performed.   ABNORMALITY - Continuous slow, generalized  and maximal left frontotemporal region IMPRESSION: This study is suggestive of cortical dysfunction arising from  left frontotemporal  region, nonspecific etiology but could be secondary to underlying structural abnormality, post-ictal state. Additionally there is moderate diffuse encephalopathy, nonspecific etiology. No seizures or epileptiform discharges were seen throughout the recording. Priyanka [name]     Medications:    levETIRAcetam Stopped (05/21/21 0135)    apixaban  2.5 mg Oral BID   ascorbic acid  500 mg Oral Daily   atorvastatin  80 mg Oral Daily   carvedilol  6.25 mg Oral BID WC   Chlorhexidine Gluconate Cloth  6 each Topical Daily   Chlorhexidine Gluconate Cloth  6 each Topical Q0600   ezetimibe  10 mg Oral Daily   ferrous gluconate  324 mg  Oral Q1500   losartan  100 mg Oral Daily   mouth rinse  15 mL Mouth Rinse BID   multivitamin  1 tablet Oral QHS   acetaminophen **OR** acetaminophen, labetalol, ondansetron **OR** ondansetron (ZOFRAN) IV, senna-docusate  Assessment/ Plan:  [name] is a 87 y.o. black female with end stage renal disease on hemodialysis, hypertension, CVA, vascular dementia, hyperlipidemia, diabetes mellitus type II, atrial fibrillation who is admitted to [facility] on 05/16/2021 for Confusion [R41.0] Agitation [R45.1]   [facility] MWF [facility] RIJ permcath 65.5kg   End Stage Renal Disease:  - Continue MWF schedule   Altered mental status:  - discontinued pregablin.  - Appreciate neurology input. Differential now includes seizures and postictal state. Continue levetiracetam   Hypertension: with hypertension urgency on admission. 138/58. Home regimen includes losartan, amlodipine, carvedilol, hydralazine. Blood pressure 113/68 - IV labatelol PRN  - started on carvedilol and losartan   Anemia of chronic kidney disease: hemoglobin 13.6. Normocytic. Mircera as outpatient. Outpatient iron levels at goal on 9/7.     Secondary Hyperparathyroidism: PTH, calcium and phosphorus at goal on 11/2. Not currently on binders.   Overall prognosis is poor. Palliative care has been consulted. Will need family meeting for goals of care.    LOS: 4 Brianna Dodson 11/19/202211:34 AM

## 2021-05-22 DIAGNOSIS — D696 Thrombocytopenia, unspecified: Secondary | ICD-10-CM | POA: Diagnosis not present

## 2021-05-22 DIAGNOSIS — I639 Cerebral infarction, unspecified: Secondary | ICD-10-CM | POA: Diagnosis not present

## 2021-05-22 DIAGNOSIS — R569 Unspecified convulsions: Secondary | ICD-10-CM | POA: Diagnosis not present

## 2021-05-22 DIAGNOSIS — G9341 Metabolic encephalopathy: Secondary | ICD-10-CM | POA: Diagnosis not present

## 2021-05-22 LAB — GLUCOSE, CAPILLARY
Glucose-Capillary: 145 mg/dL — ABNORMAL HIGH (ref 70–99)
Glucose-Capillary: 197 mg/dL — ABNORMAL HIGH (ref 70–99)
Glucose-Capillary: 136 mg/dL — ABNORMAL HIGH (ref 70–99)

## 2021-05-22 NOTE — TOC Progression Note (Signed)
Transition of Care [facility]) - Progression Note    Patient Details  Name: Milana Salay MRN: [id number] Date of Birth: December 03, 1933  [facility]) CM/SW Contact  Zigmund Daniel Dorian Pod, RN Phone Number:[phone] 05/22/2021, 3:48 PM  Clinical Narrative:    Family and attending are not requesting SNF placement due to pt's deconditioning and limited mobility. Spoke with pt's primary caregiver [name] once again on this request for placement. Caregiver has agreed and prefer to go to [facility] (placed in Sept) however will continue other choices for placement.   RN has obtained PASRR and started bed search for available facilities. Also reached out via HUB to [facility] with the family's request for placement. Awaiting bed offer.  TOC will continue to follow up with any additional needs.   Expected Discharge Plan: [place] Barriers to Discharge: Continued Medical Work up  Expected Discharge Plan and Services Expected Discharge Plan: [place] Acute Care Choice: Resumption of Svcs/PTA Provider Living arrangements for the past 2 months: Single Family Home                                       Social Determinants of Health (SDOH) Interventions    Readmission Risk Interventions Readmission Risk Prevention Plan 05/18/2021 03/03/2021 03/16/2019  Transportation Screening Complete Complete Complete  PCP or Specialist Appt within 5-7 Days - - Complete  PCP or Specialist Appt within 3-5 Days Complete Complete -  Home Care Screening - - Complete  Medication Review (RN CM) - - Complete  HRI or Home Care Consult Complete Complete -  Social Work Consult for [place] Planning/Counseling Complete Not Complete -  SW consult not completed comments - RNCM assigned to patient -  Palliative Care Screening Not Applicable Not Applicable -  Medication Review (RN Care Manager) Complete Complete -  Some recent data might be hidden

## 2021-05-22 NOTE — TOC Progression Note (Signed)
Transition of Care [facility]) - Progression Note    Patient Details  Name: Brianna Dodson MRN: [id number] Date of Birth: [birth year]  Transition of Care [facility]) CM/SW Contact  Zigmund Daniel Dorian Pod, RN Phone Number:[phone] 05/22/2021, 9:47 AM  Clinical Narrative:    RN spoke with pt's sister [name]) who indicates she is the person to call concerning discharge planning needs at this time. States she is the primary caregiver and wants to take the pt home with the involvement of [facility]. States she will be the person transporting pt home, obtaining all medications and transporting pt to all her ongoing medical appointments. Aware the spouse has had a conversation with the Palliative care team concerning the available options of care and pt's current level of care. Encouraged sister to speak with spouse on the discussion concerning the Palliative care consult.   TOC will continue to follow for ongoing discharge needs.   Expected Discharge Plan: [place] Barriers to Discharge: Continued Medical Work up  Expected Discharge Plan and Services Expected Discharge Plan: [place] Acute Care Choice: Resumption of Svcs/PTA Provider Living arrangements for the past 2 months: Single Family Home                                       Social Determinants of Health (SDOH) Interventions    Readmission Risk Interventions Readmission Risk Prevention Plan 05/18/2021 03/03/2021 03/16/2019  Transportation Screening Complete Complete Complete  PCP or Specialist Appt within 5-7 Days - - Complete  PCP or Specialist Appt within 3-5 Days Complete Complete -  Home Care Screening - - Complete  Medication Review (RN CM) - - Complete  HRI or Home Care Consult Complete Complete -  Social Work Consult for [place] Planning/Counseling Complete Not Complete -  SW consult not completed comments - RNCM assigned to patient -  Palliative Care Screening  Not Applicable Not Applicable -  Medication Review (RN Care Manager) Complete Complete -  Some recent data might be hidden

## 2021-05-22 NOTE — Progress Notes (Signed)
[facility]  ROUNDING NOTE   Subjective:   Alert and oriented today. Answering questions slowly. She states she wants to go home.    Objective:  Vital signs in last 24 hours:  Temp:  [97.5 F (36.4 C)-97.7 F (36.5 C)] 97.5 F (36.4 C) (11/20 0745) Pulse Rate:  [55-66] 55 (11/20 0745) Resp:  [15-20] 20 (11/20 0745) BP: (111-144)/(61-68) 140/61 (11/20 0745) SpO2:  [97 %-100 %] 100 % (11/20 0745)  Weight change:  Filed Weights   05/17/21 1251 05/18/21 1545  Weight: 66 kg 60.4 kg    Intake/Output: I/O last 3 completed shifts: In: 68 [P.O.:360; IV Piggyback:100] Out: 150 [Urine:150]   Intake/Output this shift:  Total I/O In: 240 [P.O.:240] Out: -   Physical Exam: General: Laying in bed  Head: Normocephalic, atraumatic. Moist oral mucosal membranes  Eyes: Anicteric, PERRL  Neck: Supple, trachea midline  Lungs:  Clear to auscultation  Heart: Regular rate and rhythm  Abdomen:  Soft, nontender,   Extremities:  No peripheral edema.  Neurologic: Alert to self, place and time. Not to situation. Left sided weakness.   Skin: No lesions  Access: Right IJ permcath    Basic Metabolic Panel: Recent Labs  Lab 05/16/21 2207 05/18/21 1545  NA 136 140  K 4.6 4.7  CL 95* 101  CO2 32 30  GLUCOSE 195* 133*  BUN 17 35*  CREATININE 2.54* 3.82*  CALCIUM 8.3* 9.3  PHOS  --  5.4*     Liver Function Tests: Recent Labs  Lab 05/16/21 2207 05/18/21 1545  AST 45*  --   ALT 22  --   ALKPHOS 66  --   BILITOT 1.1  --   PROT 7.9  --   ALBUMIN 3.8 3.4*    No results for input(s): LIPASE, AMYLASE in the last 168 hours. Recent Labs  Lab 05/20/21 1128  AMMONIA 13     CBC: Recent Labs  Lab 05/16/21 2300 05/18/21 1545  WBC 3.5* 5.7  NEUTROABS 1.8  --   HGB 13.7 13.6  HCT 40.4 40.2  MCV 87.6 86.3  PLT 87* 99*     Cardiac Enzymes: No results for input(s): CKTOTAL, CKMB, CKMBINDEX, TROPONINI in the last 168 hours.  BNP: Invalid input(s):  POCBNP  CBG: Recent Labs  Lab 05/21/21 0813 05/21/21 1209 05/21/21 1802 05/21/21 2101 05/22/21 1133  GLUCAP 93 134* 122* 145* 145*     Microbiology: Results for orders placed or performed during the hospital encounter of 05/16/21  Resp Panel by RT-PCR (Flu A&B, Covid) Nasopharyngeal Swab     Status: None   Collection Time: 05/16/21 10:07 PM   Specimen: Nasopharyngeal Swab; Nasopharyngeal(NP) swabs in vial transport medium  Result Value Ref Range Status   SARS Coronavirus 2 by RT PCR NEGATIVE NEGATIVE Final    Comment: (NOTE) SARS-CoV-2 target nucleic acids are NOT DETECTED.  The SARS-CoV-2 RNA is generally detectable in upper respiratory specimens during the acute phase of infection. The lowest concentration of SARS-CoV-2 viral copies this assay can detect is 138 copies/mL. A negative result does not preclude SARS-Cov-2 infection and should not be used as the sole basis for treatment or other patient management decisions. A negative result may occur with  improper specimen collection/handling, submission of specimen other than nasopharyngeal swab, presence of viral mutation(s) within the areas targeted by this assay, and inadequate number of viral copies(<138 copies/mL). A negative result must be combined with clinical observations, patient history, and epidemiological information. The expected result is Negative.  Fact Sheet for Patients:  EntrepreneurPulse.com.au  Fact Sheet for Healthcare Providers:  IncredibleEmployment.be  This test is no t yet approved or cleared by the [place] FDA and  has been authorized for detection and/or diagnosis of SARS-CoV-2 by FDA under an Emergency Use Authorization (EUA). This EUA will remain  in effect (meaning this test can be used) for the duration of the COVID-19 declaration under Section 564(b)(1) of the Act, 21 U.S.C.section 360bbb-3(b)(1), unless the authorization is terminated  or  revoked sooner.       Influenza A by PCR NEGATIVE NEGATIVE Final   Influenza B by PCR NEGATIVE NEGATIVE Final    Comment: (NOTE) The Xpert Xpress SARS-CoV-2/FLU/RSV plus assay is intended as an aid in the diagnosis of influenza from Nasopharyngeal swab specimens and should not be used as a sole basis for treatment. Nasal washings and aspirates are unacceptable for Xpert Xpress SARS-CoV-2/FLU/RSV testing.  Fact Sheet for Patients: EntrepreneurPulse.com.au  Fact Sheet for Healthcare Providers: IncredibleEmployment.be  This test is not yet approved or cleared by the [place] FDA and has been authorized for detection and/or diagnosis of SARS-CoV-2 by FDA under an Emergency Use Authorization (EUA). This EUA will remain in effect (meaning this test can be used) for the duration of the COVID-19 declaration under Section 564(b)(1) of the Act, 21 U.S.C. section 360bbb-3(b)(1), unless the authorization is terminated or revoked.  Performed at [facility], [address], [place], [place] [address]   Urine Culture     Status: Abnormal   Collection Time: 05/16/21 10:07 PM   Specimen: In/Out Cath Urine  Result Value Ref Range Status   Specimen Description   Final    IN/OUT CATH URINE Performed at [facility], [address], [place], [place] [address]    Special Requests   Final    NONE Performed at [facility], [address] [place], [place] [address]    Culture (A)  Final    >=100,000 COLONIES/mL GARDNERELLA VAGINALIS Standardized susceptibility testing for this organism is not available. 80,000 COLONIES/mL DIPHTHEROIDS(CORYNEBACTERIUM SPECIES)    Report Status 05/19/2021 FINAL  Final    Coagulation Studies: No results for input(s): LABPROT, INR in the last 72 hours.  Urinalysis: No results for input(s): COLORURINE, LABSPEC, PHURINE, GLUCOSEU, HGBUR, BILIRUBINUR, KETONESUR, PROTEINUR, UROBILINOGEN,  NITRITE, LEUKOCYTESUR in the last 72 hours.  Invalid input(s): APPERANCEUR     Imaging: No results found.   Medications:      apixaban  2.5 mg Oral BID   ascorbic acid  500 mg Oral Daily   atorvastatin  80 mg Oral Daily   carvedilol  6.25 mg Oral BID WC   Chlorhexidine Gluconate Cloth  6 each Topical Daily   Chlorhexidine Gluconate Cloth  6 each Topical Q0600   ezetimibe  10 mg Oral Daily   ferrous gluconate  324 mg Oral Q1500   levETIRAcetam  500 mg Oral BID   losartan  100 mg Oral Daily   mouth rinse  15 mL Mouth Rinse BID   multivitamin  1 tablet Oral QHS   acetaminophen **OR** acetaminophen, labetalol, ondansetron **OR** ondansetron (ZOFRAN) IV, senna-docusate  Assessment/ Plan:  [name] is a 87 y.o. black female with end stage renal disease on hemodialysis, hypertension, CVA, vascular dementia, hyperlipidemia, diabetes mellitus type II, atrial fibrillation who is admitted to [facility] on 05/16/2021 for Confusion [R41.0] Agitation [R45.1]   [facility] MWF [facility] RIJ permcath 65.5kg   End Stage Renal Disease:  - Continue  MWF schedule. Next treatment for tomorrow. Orders prepared.    Altered mental status:  - discontinued pregablin.  - Appreciate neurology input. Differential now includes seizures and postictal state.  - Continue levetiracetam   Hypertension: with hypertension urgency on admission. 138/65. Home regimen includes losartan, amlodipine, carvedilol, hydralazine.   - IV labatelol PRN  - started on carvedilol and losartan. Holding amlodipine.    Anemia of chronic kidney disease: hemoglobin 13.6. Normocytic. Mircera as outpatient. Outpatient iron levels at goal on 9/7.     Secondary Hyperparathyroidism: PTH, calcium and phosphorus at goal on 11/2. Not currently on binders.   Overall prognosis is poor. Palliative care has been consulted. Will need family meeting for goals of care.    LOS: 5 Brianna Dodson 11/20/20221:25 PM

## 2021-05-22 NOTE — Progress Notes (Signed)
[facility] NAME: Brianna Dodson    MR#:  [id number]  DATE OF BIRTH:  [birth year]  SUBJECTIVE:    No family at bedside  patient more awake and ate some breakfast earlier per RN. Patient took her meds.  Confused at baseline REVIEW OF SYSTEMS:   Review of Systems  Unable to perform ROS: Mental status change  Tolerating Diet: yes Tolerating PT:   DRUG ALLERGIES:   Allergies  Allergen Reactions   Nsaids     CKD stage III - Avoid all nephrotoxic drugs    VITALS:  Blood pressure 140/61, pulse (!) 55, temperature (!) 97.5 F (36.4 C), resp. rate 20, height 5\' 5"  (1.651 m), weight 60.4 kg, SpO2 100 %.  PHYSICAL EXAMINATION:   Physical Examlimited due to AMS  GENERAL:  87 y.o.-year-old patient lying in the bed with no acute distress.  HEENT: Head atraumatic, normocephalic. Oropharynx and nasopharynx clear.  LUNGS: Normal breath sounds bilaterally, no wheezing, rales, rhonchi. No use of accessory muscles of respiration.  CARDIOVASCULAR: S1, S2 normal. No murmurs, rubs, or gallops.  ABDOMEN: Soft, nontender, nondistended.  EXTREMITIES: No edema b/l.    NEUROLOGIC: non-focal PSYCHIATRIC:  patient is awake and  communicative some, confused at baseline SKIN: per RN  LABORATORY PANEL:  CBC Recent Labs  Lab 05/18/21 1545  WBC 5.7  HGB 13.6  HCT 40.2  PLT 99*     Chemistries  Recent Labs  Lab 05/16/21 2207 05/18/21 1545  NA 136 140  K 4.6 4.7  CL 95* 101  CO2 32 30  GLUCOSE 195* 133*  BUN 17 35*  CREATININE 2.54* 3.82*  CALCIUM 8.3* 9.3  AST 45*  --   ALT 22  --   ALKPHOS 66  --   BILITOT 1.1  --     Cardiac Enzymes No results for input(s): TROPONINI in the last 168 hours. RADIOLOGY:  No results found. ASSESSMENT AND PLAN:   Brianna Dodson is a 87 y.o. female with medical history significant for ESRD on HD MWF, anemia of CKD, paroxysmal A. fib on Eliquis, DM, Alzheimer's disease, OSA, HTN, DM, recurrent  strokes, who was brought in by EMS for agitation and altered mental status.  EMS had been called out to the home 3 times in the past few days for altered mental status but family declined transport to the emergency room.  Patient went to dialysis earlier in the day but became agitated on her arrival back home and this time family agreed for her to be brought in.    Chest x-ray nonacute Head CT with chronic right MCA infarct chronic atrophic and ischemic changes.  Acute encephalopathy suspected metabolic with malignant hypertension Alzheimer's dementia along with vascular dementia acute delirium-- unclear etiology -- IV PRN labetalol -- clonidine patch -- patient not awake enough to take her multiple PO blood pressure meds -- per family patient started getting agitated/delirious after she came back from dialysis yesterday -- in the ER patient was lethargic. Unable to hold any conversation. -- CT head negative for acute abnormality. Shows chronic stroke and chronic small vessel ischemic changes. -- Will try get MRI of the brain-- discussed with granddaughter on the phone -- PRN Haldol --11/16--received ativan earlier for MRI brain--Neg for acute CVA --11/17--pt awake but non verbal. Remains calm.not eating. RN unable to give po meds --not on any sedating meds. --overall declining in the last 2-3 days. Palliative care consult placed. --11/18-- no improvement in  mental status -- EEG suggestive of cortical dysfunction arising from  left frontotemporal region, nonspecific etiology but could be secondary to underlying structural abnormality, post-ictal state. Additionally there is moderate diffuse encephalopathy, nonspecific etiology. No seizures or epileptiform discharges were seen throughout the recording --Dr Rory Percy recommends keppra trial to see if there is any improvement in MS. --Palliative care to speak/meet with family today. --11/19-- patient more awake. Ate breakfast. Taking meds. Per RN  lost her IV. --11/20--no new issues. PT to see pt again  End-stage renal disease on hemodialysis Monday Wednesday Friday -- nephrology consultation with Dr. Juleen China --tolerating HD  type II diabetes with renal failure -- d/c SSI --cont sugar checks 5 times a day -- A1c 5.5  history of CVA with vascular dementia -- 11/16--MRI of the brain--no acute CVA, Chronic CVA's  Hyperlipidemia --not taking any po meds  HTN --not taking any po meds --cont clonidine patch and prn IV labetalol  Overall poor prognosis 11/18-palliative care spoke with [name] patient's husband wants everything done. Patient is a full code  Procedures: Family communication :Spoke with [name] on the phone Consults : nephrology, Neurology CODE STATUS: full DVT Prophylaxis : heparin Level of care: Med-Surg Status is: Inpatient  Remains inpatient appropriate because: d/c planning    TOTAL TIME TAKING CARE OF THIS PATIENT:25 minutes.  >50% time spent on counselling and coordination of care  Note: This dictation was prepared with Dragon dictation along with smaller phrase technology. Any transcriptional errors that result from this process are unintentional.  Fritzi Mandes M.D    [facility]   CC: Primary care physician; [name], MD [name], female   DOB: 01/25/1934, 87 y.o.   MRN: [id number]

## 2021-05-22 NOTE — NC FL2 (Signed)
[place] LEVEL OF CARE SCREENING TOOL     IDENTIFICATION  Patient Name: Brianna Dodson Birthdate: [birth year] Sex: female Admission Date (Current Location): 05/16/2021  [facility] and [place] Number:  Engineering geologist and Address:  [facility], [address] [place], [place] [address]      Provider Number: [id number]  Attending Physician Name and Address:  Fritzi Mandes, MD  Relative Name and Phone Number:  [name] [phone]    Current Level of Care: Hospital Recommended Level of Care: [place] Prior Approval Number:    Date Approved/Denied: 02/28/21 PASRR Number: [id number]  Discharge Plan: SNF    Current Diagnoses: Patient Active Problem List   Diagnosis Date Noted   Post-ictal state ([place])    Agitation 05/17/2021   Hypertensive urgency 05/17/2021   Carpal tunnel syndrome on right 04/12/2021   Edema 03/29/2021   Pressure injury of skin 03/18/2021   Syncope and collapse 03/16/2021   Chronic anticoagulation 03/16/2021   Hypotension 03/16/2021   Recurrent syncope 03/16/2021   Pressure injury of sacral region, stage 2 ([place]) 03/15/2021   Leakage of vascular dialysis catheter ([place]) 03/03/2021   Renal dialysis device, implant, or graft complication    Recurrent strokes ([place]) 02/25/2021   Left arm pain 02/25/2021   Thrombocytopenia ([place]) 02/25/2021   Other disorders of phosphorus metabolism 01/12/2021   Coagulation defect, unspecified ([place]) 10/12/2020   Diarrhea, unspecified 10/12/2020   Encounter for immunization 10/12/2020   End stage renal disease on dialysis ([place]) 10/12/2020   Fever, unspecified 10/12/2020   Headache, unspecified 10/12/2020   Iron deficiency anemia, unspecified 10/12/2020   Localized edema 10/12/2020   Nontoxic single thyroid nodule 10/12/2020   Occlusion and stenosis of unspecified carotid artery 10/12/2020   Other fluid overload 10/12/2020   Pain, unspecified 10/12/2020    Shortness of breath 10/12/2020   Vascular dementia without behavioral disturbance ([place]) 10/12/2020   Malnutrition of moderate degree 09/13/2020   Anemia of chronic disease 09/10/2020   Sleeping difficulty 07/08/2020   Fatigue 06/09/2020   Mixed Alzheimer's and vascular dementia ([place]) 06/09/2020   Coccydynia 01/26/2020   Leukopenia 12/08/2019   Muscle strain 12/08/2019   Pedal edema 11/26/2019   Dysarthria and anarthria 11/07/2019   Atrial fibrillation ([place]) 10/21/2019   Nocturia 10/21/2019   Seborrheic keratosis 10/21/2019   Carotid stenosis 08/27/2019   Thyroid nodule 08/27/2019   Status post placement of implantable loop recorder 08/07/2019   Anemia in ESRD (end-stage renal disease) ([place]) 04/18/2019   Aphasia 04/12/2019   Benign hypertensive renal disease 04/07/2019   Hyperparathyroidism due to renal insufficiency ([place]) 04/07/2019   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, or unspecified 04/07/2019   Proteinuria 04/07/2019   History of stroke with current residual effects 03/15/2019   Hyperlipidemia associated with type 2 diabetes mellitus ([place]) 07/25/2018   Itching 10/23/2017   Nevus 04/17/2017   CKD (chronic kidney disease), stage V ([place]) 02/02/2015   Vitamin D deficiency 02/02/2015   Obesity (BMI 30-39.9) 08/03/2014   Type II diabetes mellitus with renal manifestations ([place]) 01/29/2014   Essential hypertension 01/13/2014   HLD (hyperlipidemia) 01/13/2014    Orientation RESPIRATION BLADDER Height & Weight     Self  Normal  (HD) Weight: 60.4 kg Height:  5\' 5"  (165.1 cm)  BEHAVIORAL SYMPTOMS/MOOD NEUROLOGICAL BOWEL NUTRITION STATUS      Incontinent Diet  AMBULATORY STATUS COMMUNICATION OF NEEDS Skin   Limited Assist (PT-"Frequent and constant supervision") Verbally  Normal                       Personal Care Assistance Level of Assistance  Bathing, Dressing Bathing Assistance: Limited assistance   Dressing  Assistance: Limited assistance     Functional Limitations Info             SPECIAL CARE FACTORS FREQUENCY  PT (By licensed PT), OT (By licensed OT)     PT Frequency: 5 X weekly OT Frequency: 5 X weekly            Contractures      Additional Factors Info                  Current Medications (05/22/2021):  This is the current hospital active medication list Current Facility-Administered Medications  Medication Dose Route Frequency Provider Last Rate Last Admin   acetaminophen (TYLENOL) tablet 650 mg  650 mg Oral Q6H PRN Athena Masse, MD   650 mg at 05/21/21 2228   Or   acetaminophen (TYLENOL) suppository 650 mg  650 mg Rectal Q6H PRN Athena Masse, MD       apixaban Arne [place]) tablet 2.5 mg  2.5 mg Oral BID Fritzi Mandes, MD   2.5 mg at 05/22/21 6962   ascorbic acid (VITAMIN C) tablet 500 mg  500 mg Oral Daily Fritzi Mandes, MD   500 mg at 05/22/21 0914   atorvastatin (LIPITOR) tablet 80 mg  80 mg Oral Daily Fritzi Mandes, MD   80 mg at 05/22/21 0913   carvedilol (COREG) tablet 6.25 mg  6.25 mg Oral BID WC Fritzi Mandes, MD   6.25 mg at 05/22/21 9528   Chlorhexidine Gluconate Cloth 2 % PADS 6 each  6 each Topical Daily Athena Masse, MD   6 each at 05/21/21 1031   Chlorhexidine Gluconate Cloth 2 % PADS 6 each  6 each Topical Q0600 Lavonia Dana, MD   6 each at 05/22/21 0914   ezetimibe (ZETIA) tablet 10 mg  10 mg Oral Daily Fritzi Mandes, MD   10 mg at 05/22/21 0913   ferrous gluconate (FERGON) tablet 324 mg  324 mg Oral Q1500 Fritzi Mandes, MD   324 mg at 05/21/21 1600   labetalol (NORMODYNE) injection 10 mg  10 mg Intravenous Q2H PRN Fritzi Mandes, MD   10 mg at 05/18/21 0921   levETIRAcetam (KEPPRA) tablet 500 mg  500 mg Oral BID Fritzi Mandes, MD   500 mg at 05/22/21 0914   losartan (COZAAR) tablet 100 mg  100 mg Oral Daily Fritzi Mandes, MD   100 mg at 05/22/21 4132   MEDLINE mouth rinse  15 mL Mouth Rinse BID Fritzi Mandes, MD   15 mL at 05/22/21 0914   multivitamin  (RENA-VIT) tablet 1 tablet  1 tablet Oral QHS Fritzi Mandes, MD   1 tablet at 05/21/21 2229   ondansetron (ZOFRAN) tablet 4 mg  4 mg Oral Q6H PRN Athena Masse, MD       Or   ondansetron [facility]) injection 4 mg  4 mg Intravenous Q6H PRN Athena Masse, MD       senna-docusate (Senokot-S) tablet 1 tablet  1 tablet Oral QHS PRN Fritzi Mandes, MD         Discharge Medications: Please see discharge summary for a list of discharge medications.  Relevant Imaging Results:  Relevant Lab Results:   Additional Information SS# [id number]  Harriet Masson, RN

## 2021-05-22 NOTE — Progress Notes (Signed)
Physical Therapy Treatment Patient Details Name: Brianna Dodson MRN: [id number] DOB: 11/04/1933 Today's Date: 05/22/2021   History of Present Illness Pt is an 87 y/o F with PMH: multiple strokes most recent right MCA stroke in March 2022 with residual left-sided weakness, with worsening left-sided weakness after left upper extremity AV fistula surgery on 8/25 (Dr. [name]). Pt presented to ED d/t L arm pain/throbbing starting the night after the procedure. MRI reviewed by neurology and assessment as follows: "although formal radiology read is concerning for acute on chronic infarction it looks like cortical laminar necrosis and not like a new stroke.  She does have left hand grip weakness and left wrist drop which is new from before, but I do not suspect that this is from a new stroke." Pt now s/p ligation of L UE HD cath site to improve perfusion on 02/26/21.    PT Comments    Pt is making progress towards goals and agreeable to work with therapist. Family in room and pt appears to be more compliant with family encouragement. +2 HHA for pt to ambulate short distance in room. High falls risk. Becomes easily distracted. If family decides to return home with patient, she will need 24/7 supervision due to confusion. Currently recommend SNF as she is participating and able to make progress towards goals. Will continue to progress.   Recommendations for follow up therapy are one component of a multi-disciplinary discharge planning process, led by the attending physician.  Recommendations may be updated based on patient status, additional functional criteria and insurance authorization.  Follow Up Recommendations  Skilled nursing-short term rehab (<3 hours/day)     Assistance Recommended at Discharge Frequent or constant Supervision/Assistance  Equipment Recommendations   (TBD)    Recommendations for Other Services       Precautions / Restrictions Precautions Precautions: Fall Restrictions Weight  Bearing Restrictions: No     Mobility  Bed Mobility Overal bed mobility: Needs Assistance Bed Mobility: Supine to Sit;Sit to Supine     Supine to sit: Mod assist;Max assist     General bed mobility comments: needs heavy cues for sequencing and command following. ONce seated, easily loses balance and increased sway noted. Needs constant mod assist to maintain upright posture    Transfers Overall transfer level: Needs assistance Equipment used: Rolling walker (2 wheels) Transfers: Sit to/from Stand Sit to Stand: Max assist           General transfer comment: Needs heavy assist on L side due to chronic weakness. Once standing, able to maintain upright posture. Unable to hold onto RW with L hand. Further attempts performed with +2 HHA assist with mod assist.    Ambulation/Gait Ambulation/Gait assistance: Mod assist;+2 physical assistance Gait Distance (Feet): 10 Feet Assistive device: 2 person hand held assist Gait Pattern/deviations: Step-to pattern       General Gait Details: Initially able to take steps up towards [place]. For mobility away from bed, +2 required. very short BOS with scissoring gait noted. +2 HHA required for safety. Easily loses balance with ambulation.   Stairs             Wheelchair Mobility    Modified Rankin (Stroke Patients Only)       Balance Overall balance assessment: Needs assistance Sitting-balance support: Feet supported;Single extremity supported Sitting balance-Leahy Scale: Poor     Standing balance support: Bilateral upper extremity supported;Reliant on assistive device for balance Standing balance-Leahy Scale: Poor  Cognition Arousal/Alertness: Awake/alert Behavior During Therapy: Impulsive Overall Cognitive Status: History of cognitive impairments - at baseline                                 General Comments: confused, at baseline per family in room. She was able to  recognize son in room        Exercises Other Exercises Other Exercises: attempted seated ther-ex performed on B LE including LAQ and alt marching. 10 reps performed with pt easily distracted and needs frequent cues for attention to task    General Comments        Pertinent Vitals/Pain Pain Assessment: No/denies pain    Home Living                          Prior Function            PT Goals (current goals can now be found in the care plan section) Acute Rehab PT Goals Patient Stated Goal: to go to the bathroom PT Goal Formulation: With family Progress towards PT goals: Progressing toward goals    Frequency    Min 2X/week      PT Plan Current plan remains appropriate    Co-evaluation              AM-PAC PT "6 Clicks" Mobility   Outcome Measure  Help needed turning from your back to your side while in a flat bed without using bedrails?: A Little Help needed moving from lying on your back to sitting on the side of a flat bed without using bedrails?: A Little Help needed moving to and from a bed to a chair (including a wheelchair)?: A Lot Help needed standing up from a chair using your arms (e.g., wheelchair or bedside chair)?: A Lot Help needed to walk in hospital room?: A Lot Help needed climbing 3-5 steps with a railing? : Total 6 Click Score: 13    End of Session   Activity Tolerance: Patient tolerated treatment well Patient left: in chair;with chair alarm set;with family/visitor present Nurse Communication: Mobility status PT Visit Diagnosis: Unsteadiness on feet (R26.81);Muscle weakness (generalized) (M62.81);History of falling (Z91.81);Difficulty in walking, not elsewhere classified (R26.2);Hemiplegia and hemiparesis Hemiplegia - Right/Left: Left     Time: 7616-0737 PT Time Calculation (min) (ACUTE ONLY): 25 min  Charges:  $Gait Training: 8-22 mins $Therapeutic Exercise: 8-22 mins                     Greggory Stallion, PT,  DPT [phone]    Londen Bok 05/22/2021, 12:51 PM

## 2021-05-23 DIAGNOSIS — I639 Cerebral infarction, unspecified: Secondary | ICD-10-CM | POA: Diagnosis not present

## 2021-05-23 DIAGNOSIS — R569 Unspecified convulsions: Secondary | ICD-10-CM | POA: Diagnosis not present

## 2021-05-23 DIAGNOSIS — G9341 Metabolic encephalopathy: Secondary | ICD-10-CM | POA: Diagnosis not present

## 2021-05-23 DIAGNOSIS — D696 Thrombocytopenia, unspecified: Secondary | ICD-10-CM | POA: Diagnosis not present

## 2021-05-23 LAB — GLUCOSE, CAPILLARY
Glucose-Capillary: 107 mg/dL — ABNORMAL HIGH (ref 70–99)
Glucose-Capillary: 138 mg/dL — ABNORMAL HIGH (ref 70–99)
Glucose-Capillary: 129 mg/dL — ABNORMAL HIGH (ref 70–99)
Glucose-Capillary: 189 mg/dL — ABNORMAL HIGH (ref 70–99)

## 2021-05-23 MED ORDER — LEVETIRACETAM 250 MG PO TABS
250.0000 mg | ORAL_TABLET | ORAL | Status: DC
  Administered 2021-05-23 – 2021-06-01 (×5): 250 mg via ORAL
  Filled 2021-05-23 (×6): qty 1

## 2021-05-23 MED ORDER — HALOPERIDOL LACTATE 5 MG/ML IJ SOLN: 2.0000 mg | Freq: Four times a day (QID) | INTRAMUSCULAR | Status: DC | PRN

## 2021-05-23 MED ORDER — LOSARTAN POTASSIUM 50 MG PO TABS
100.0000 mg | ORAL_TABLET | Freq: Every day | ORAL | Status: DC
  Administered 2021-05-24 – 2021-05-29 (×5): 100 mg via ORAL
  Filled 2021-05-23 (×7): qty 2

## 2021-05-23 MED ORDER — HEPARIN SODIUM (PORCINE) 1000 UNIT/ML IJ SOLN
INTRAMUSCULAR | Status: AC
  Filled 2021-05-23: qty 1

## 2021-05-23 MED ORDER — HALOPERIDOL LACTATE 5 MG/ML IJ SOLN
1.0000 mg | Freq: Four times a day (QID) | INTRAMUSCULAR | Status: DC | PRN
  Administered 2021-05-23: 1 mg via INTRAMUSCULAR
  Filled 2021-05-23: qty 1

## 2021-05-23 MED ORDER — HALOPERIDOL LACTATE 5 MG/ML IJ SOLN
2.0000 mg | Freq: Four times a day (QID) | INTRAMUSCULAR | Status: DC | PRN
  Administered 2021-05-24 (×2): 2 mg via INTRAMUSCULAR
  Filled 2021-05-23 (×2): qty 1

## 2021-05-23 NOTE — TOC Progression Note (Signed)
Transition of Care [facility]) - Progression Note    Patient Details  Name: Brianna Dodson MRN: [id number] Date of Birth: [birth year]  [facility] CM/SW Contact  Beverly Sessions, RN Phone Number: 05/23/2021, 2:22 PM  Clinical Narrative:     [facility] and [facility] unable to offer bed Reached out to [facility] and [facility] to request review  Sister is aware that we do not have an offer at this time in [place].  Agreeable to extend bed search  Bed search extended  [facility] HD liaison updated   Expected Discharge Plan: [place] Barriers to Discharge: Continued Medical Work up  Expected Discharge Plan and Services Expected Discharge Plan: [place] Acute Care Choice: Resumption of Svcs/PTA Provider Living arrangements for the past 2 months: Single Family Home                                       Social Determinants of Health (SDOH) Interventions    Readmission Risk Interventions Readmission Risk Prevention Plan 05/18/2021 03/03/2021 03/16/2019  Transportation Screening Complete Complete Complete  PCP or Specialist Appt within 5-7 Days - - Complete  PCP or Specialist Appt within 3-5 Days Complete Complete -  Home Care Screening - - Complete  Medication Review (RN CM) - - Complete  HRI or Home Care Consult Complete Complete -  Social Work Consult for [place] Planning/Counseling Complete Not Complete -  SW consult not completed comments - RNCM assigned to patient -  Palliative Care Screening Not Applicable Not Applicable -  Medication Review (RN Care Manager) Complete Complete -  Some recent data might be hidden

## 2021-05-23 NOTE — Progress Notes (Addendum)
The patient is agitated, screaming and trying to get out of bed multiple times. Notified Dr. Sidney Ace and received a new order for Haldol as indicated in [facility]. Will administer and continue to monitor.

## 2021-05-23 NOTE — Progress Notes (Signed)
Physical Therapy Treatment Patient Details Name: Brianna Dodson MRN: [id number] DOB: [birth year] Today's Date: 05/23/2021   History of Present Illness Pt is an 87 y/o F with PMH: multiple strokes most recent right MCA stroke in March 2022 with residual left-sided weakness, with worsening left-sided weakness after left upper extremity AV fistula surgery on 8/25 (Dr. [name]). Pt presented to ED d/t L arm pain/throbbing starting the night after the procedure. MRI reviewed by neurology and assessment as follows: "although formal radiology read is concerning for acute on chronic infarction it looks like cortical laminar necrosis and not like a new stroke.  She does have left hand grip weakness and left wrist drop which is new from before, but I do not suspect that this is from a new stroke." Pt now s/p ligation of L UE HD cath site to improve perfusion on 02/26/21.    PT Comments    Patient requesting to use the bathroom on arrival to the room. She needs assistance for bed mobility but initiated movements. Patient performed multiple transfers this session including pivot transfer to bed side commode and 5 sit to stand transfers from the bed. Minimal assistance needed with transfers for safety. Patient is impulsive with activity and needs constant cues for participation, attention to task, and redirection. She continues to have limited activity tolerance in standing and generalized weakness. Recommend to continue PT to maximize independence with SNF recommended at discharge.     Recommendations for follow up therapy are one component of a multi-disciplinary discharge planning process, led by the attending physician.  Recommendations may be updated based on patient status, additional functional criteria and insurance authorization.  Follow Up Recommendations  Skilled nursing-short term rehab (<3 hours/day)     Assistance Recommended at Discharge Frequent or constant Supervision/Assistance  Equipment  Recommendations   (to be determined at next level of care)    Recommendations for Other Services       Precautions / Restrictions Precautions Precautions: Fall Restrictions Weight Bearing Restrictions: No     Mobility  Bed Mobility Overal bed mobility: Needs Assistance Bed Mobility: Supine to Sit;Sit to Supine     Supine to sit: Mod assist Sit to supine: Mod assist   General bed mobility comments: patient initiated sitting upright as she was requesting to get out of bed to the bathroom. assistance for LE and trunk support. multi-modal cues required for return to bed    Transfers Overall transfer level: Needs assistance Equipment used: None Transfers: Sit to/from Stand;Bed to chair/wheelchair/BSC Sit to Stand: Min assist Stand pivot transfers: Min assist         General transfer comment: patient transferred from bed to bed side commode (unsuccessful at using the bathroom). verbal cues for hand placement and technique. patient performed multiple other transfers, including pivot transfer back to the bed and sit to stand transfer x 5 bouts from the bed with lifting assistance    Ambulation/Gait               General Gait Details: not attempted as patient has difficulty following commands with standing, has generalized weakness   Stairs             Wheelchair Mobility    Modified Rankin (Stroke Patients Only)       Balance   Sitting-balance support: Feet supported;Single extremity supported Sitting balance-Leahy Scale: Fair     Standing balance support: Single extremity supported Standing balance-Leahy Scale: Poor Standing balance comment: at least minimal assistance required to maintain standing  balance                            Cognition Arousal/Alertness: Awake/alert Behavior During Therapy: Impulsive;Restless Overall Cognitive Status: History of cognitive impairments - at baseline                                  General Comments: patient yelling out at times during session. she needs constant cues for attention to task and for participation. she is confused throughout the session. no family in the room.        Exercises      General Comments        Pertinent Vitals/Pain Pain Assessment: Faces Faces Pain Scale: No hurt    Home Living                          Prior Function            PT Goals (current goals can now be found in the care plan section) Acute Rehab PT Goals Patient Stated Goal: to go to the bathroom PT Goal Formulation: With family Progress towards PT goals: Progressing toward goals    Frequency    Min 2X/week      PT Plan Current plan remains appropriate    Co-evaluation              AM-PAC PT "6 Clicks" Mobility   Outcome Measure  Help needed turning from your back to your side while in a flat bed without using bedrails?: A Little Help needed moving from lying on your back to sitting on the side of a flat bed without using bedrails?: A Little Help needed moving to and from a bed to a chair (including a wheelchair)?: A Lot Help needed standing up from a chair using your arms (e.g., wheelchair or bedside chair)?: A Lot Help needed to walk in hospital room?: A Lot Help needed climbing 3-5 steps with a railing? : Total 6 Click Score: 13    End of Session   Activity Tolerance: Patient tolerated treatment well Patient left: in bed;with call bell/phone within reach;with bed alarm set Nurse Communication: Mobility status PT Visit Diagnosis: Unsteadiness on feet (R26.81);Muscle weakness (generalized) (M62.81);History of falling (Z91.81);Difficulty in walking, not elsewhere classified (R26.2);Hemiplegia and hemiparesis Hemiplegia - Right/Left: Left     Time: 9242-6834 PT Time Calculation (min) (ACUTE ONLY): 21 min  Charges:  $Therapeutic Activity: 8-22 mins                     Minna Merritts, PT, MPT    Percell Locus 05/23/2021,  4:04 PM

## 2021-05-23 NOTE — Progress Notes (Signed)
[facility]  ROUNDING NOTE   Subjective:    Patient seen during dialysis Tolerating well    HEMODIALYSIS FLOWSHEET:  Blood Flow Rate (mL/min): 400 mL/min Arterial Pressure (mmHg): -180 mmHg Venous Pressure (mmHg): 140 mmHg Transmembrane Pressure (mmHg): 60 mmHg Ultrafiltration Rate (mL/min): 330 mL/min Dialysate Flow Rate (mL/min): 500 ml/min Conductivity: Machine : 13.8 Conductivity: Machine : 13.8   Reports that she is eating very little    Objective:  Vital signs in last 24 hours:  Temp:  [97.5 F (36.4 C)-97.7 F (36.5 C)] 97.7 F (36.5 C) (11/21 0756) Pulse Rate:  [57-74] 58 (11/21 1045) Resp:  [11-19] 11 (11/21 1045) BP: (118-158)/(57-72) 132/57 (11/21 1045) SpO2:  [98 %-100 %] 98 % (11/21 1045)  Weight change:  Filed Weights   05/17/21 1251 05/18/21 1545  Weight: 66 kg 60.4 kg    Intake/Output: I/O last 3 completed shifts: In: 480 [P.O.:480] Out: 0    Intake/Output this shift:  No intake/output data recorded.  Physical Exam: General: Laying in bed  Head: Normocephalic, atraumatic. Moist oral mucosal membranes  Neck: Supple,    Lungs:  Clear to auscultation  Heart: Regular rate and rhythm  Abdomen:  Soft, nontender,   Extremities:  No peripheral edema.  Neurologic: Alert to self,  Left sided weakness.   Skin: No lesions  Access: Right IJ permcath    Basic Metabolic Panel: Recent Labs  Lab 05/16/21 2207 05/18/21 1545  NA 136 140  K 4.6 4.7  CL 95* 101  CO2 32 30  GLUCOSE 195* 133*  BUN 17 35*  CREATININE 2.54* 3.82*  CALCIUM 8.3* 9.3  PHOS  --  5.4*     Liver Function Tests: Recent Labs  Lab 05/16/21 2207 05/18/21 1545  AST 45*  --   ALT 22  --   ALKPHOS 66  --   BILITOT 1.1  --   PROT 7.9  --   ALBUMIN 3.8 3.4*    No results for input(s): LIPASE, AMYLASE in the last 168 hours. Recent Labs  Lab 05/20/21 1128  AMMONIA 13     CBC: Recent Labs  Lab 05/16/21 2300 05/18/21 1545  WBC 3.5* 5.7   NEUTROABS 1.8  --   HGB 13.7 13.6  HCT 40.4 40.2  MCV 87.6 86.3  PLT 87* 99*     Cardiac Enzymes: No results for input(s): CKTOTAL, CKMB, CKMBINDEX, TROPONINI in the last 168 hours.  BNP: Invalid input(s): POCBNP  CBG: Recent Labs  Lab 05/22/21 0757 05/22/21 1133 05/22/21 1559 05/22/21 2048 05/23/21 0755  GLUCAP 107* 145* 197* 136* 138*     Microbiology: Results for orders placed or performed during the hospital encounter of 05/16/21  Resp Panel by RT-PCR (Flu A&B, Covid) Nasopharyngeal Swab     Status: None   Collection Time: 05/16/21 10:07 PM   Specimen: Nasopharyngeal Swab; Nasopharyngeal(NP) swabs in vial transport medium  Result Value Ref Range Status   SARS Coronavirus 2 by RT PCR NEGATIVE NEGATIVE Final    Comment: (NOTE) SARS-CoV-2 target nucleic acids are NOT DETECTED.  The SARS-CoV-2 RNA is generally detectable in upper respiratory specimens during the acute phase of infection. The lowest concentration of SARS-CoV-2 viral copies this assay can detect is 138 copies/mL. A negative result does not preclude SARS-Cov-2 infection and should not be used as the sole basis for treatment or other patient management decisions. A negative result may occur with  improper specimen collection/handling, submission of specimen other than nasopharyngeal swab, presence of viral mutation(s)  within the areas targeted by this assay, and inadequate number of viral copies(<138 copies/mL). A negative result must be combined with clinical observations, patient history, and epidemiological information. The expected result is Negative.  Fact Sheet for Patients:  EntrepreneurPulse.com.au  Fact Sheet for Healthcare Providers:  IncredibleEmployment.be  This test is no t yet approved or cleared by the [place] FDA and  has been authorized for detection and/or diagnosis of SARS-CoV-2 by FDA under an Emergency Use Authorization (EUA). This  EUA will remain  in effect (meaning this test can be used) for the duration of the COVID-19 declaration under Section 564(b)(1) of the Act, 21 U.S.C.section 360bbb-3(b)(1), unless the authorization is terminated  or revoked sooner.       Influenza A by PCR NEGATIVE NEGATIVE Final   Influenza B by PCR NEGATIVE NEGATIVE Final    Comment: (NOTE) The Xpert Xpress SARS-CoV-2/FLU/RSV plus assay is intended as an aid in the diagnosis of influenza from Nasopharyngeal swab specimens and should not be used as a sole basis for treatment. Nasal washings and aspirates are unacceptable for Xpert Xpress SARS-CoV-2/FLU/RSV testing.  Fact Sheet for Patients: EntrepreneurPulse.com.au  Fact Sheet for Healthcare Providers: IncredibleEmployment.be  This test is not yet approved or cleared by the [place] FDA and has been authorized for detection and/or diagnosis of SARS-CoV-2 by FDA under an Emergency Use Authorization (EUA). This EUA will remain in effect (meaning this test can be used) for the duration of the COVID-19 declaration under Section 564(b)(1) of the Act, 21 U.S.C. section 360bbb-3(b)(1), unless the authorization is terminated or revoked.  Performed at [facility], [address], [place], [place] [address]   Urine Culture     Status: Abnormal   Collection Time: 05/16/21 10:07 PM   Specimen: In/Out Cath Urine  Result Value Ref Range Status   Specimen Description   Final    IN/OUT CATH URINE Performed at [facility], [address] [place], [place] [address]    Special Requests   Final    NONE Performed at [facility], [address] [place], [place] [address]    Culture (A)  Final    >=100,000 COLONIES/mL GARDNERELLA VAGINALIS Standardized susceptibility testing for this organism is not available. 80,000 COLONIES/mL DIPHTHEROIDS(CORYNEBACTERIUM SPECIES)    Report Status 05/19/2021 FINAL  Final     Coagulation Studies: No results for input(s): LABPROT, INR in the last 72 hours.  Urinalysis: No results for input(s): COLORURINE, LABSPEC, PHURINE, GLUCOSEU, HGBUR, BILIRUBINUR, KETONESUR, PROTEINUR, UROBILINOGEN, NITRITE, LEUKOCYTESUR in the last 72 hours.  Invalid input(s): APPERANCEUR     Imaging: No results found.   Medications:      apixaban  2.5 mg Oral BID   ascorbic acid  500 mg Oral Daily   atorvastatin  80 mg Oral Daily   carvedilol  6.25 mg Oral BID WC   Chlorhexidine Gluconate Cloth  6 each Topical Daily   Chlorhexidine Gluconate Cloth  6 each Topical Q0600   ezetimibe  10 mg Oral Daily   ferrous gluconate  324 mg Oral Q1500   levETIRAcetam  500 mg Oral BID   losartan  100 mg Oral Daily   mouth rinse  15 mL Mouth Rinse BID   multivitamin  1 tablet Oral QHS   acetaminophen **OR** acetaminophen, haloperidol lactate, labetalol, ondansetron **OR** ondansetron (ZOFRAN) IV, senna-docusate  Assessment/ Plan:  [name] is a 87 y.o. black female with end stage renal disease on hemodialysis, hypertension, CVA, vascular dementia, hyperlipidemia, diabetes  mellitus type II, atrial fibrillation who is admitted to [facility] on 05/16/2021 for Confusion [R41.0] Agitation [R45.1]   [facility] MWF [facility] [address] RIJ permcath 65.5kg   End Stage Renal Disease:  - Continue MWF schedule. Patient seen during dialysis Tolerating well    Altered mental status:  - Continue levetiracetam.  Neurology recommendations are 500 twice a day with additional 250 mg dose after dialysis.   Hypertension: with hypertension urgency on admission. Home regimen includes losartan, amlodipine, carvedilol, hydralazine.   - IV labatelol PRN  - started on carvedilol and losartan. Holding amlodipine.  -Blood pressure low during dialysis.  Change losartan to be administered at night.   Anemia of chronic kidney disease: hemoglobin 13.6. Normocytic. Mircera as outpatient. Outpatient iron levels  at goal on 9/7.     Secondary Hyperparathyroidism: PTH, calcium and phosphorus at goal on 11/2. Not currently on binders.   Overall prognosis is poor. Palliative care has been consulted.   LOS: 6 Willys Salvino 11/21/202211:20 AM

## 2021-05-23 NOTE — Progress Notes (Signed)
OT Cancellation Note  Patient Details Name: Brianna Dodson MRN: [id number] DOB: September 19, 1933   Cancelled Treatment:    Reason Eval/Treat Not Completed: Patient at procedure or test/ unavailable. Pt noted to be off the floor for hd, unavailable at this time. Will continue to follow POC at later date/time as pt available.   Dessie Coma, M.S. OTR/L  05/23/21, 12:59 PM  ascom [phone]

## 2021-05-23 NOTE — Progress Notes (Signed)
Brianna Dodson NAME: Brianna Dodson    MR#:  [id number]  DATE OF BIRTH:  08-30-1933  SUBJECTIVE:  patient seen at hemodialysis. She is more awake and answered some questions appropriately.   REVIEW OF SYSTEMS:   Review of Systems  Unable to perform ROS: Mental status change  Tolerating Diet: yes Tolerating PT: rehab  DRUG ALLERGIES:   Allergies  Allergen Reactions   Nsaids     CKD stage III - Avoid all nephrotoxic drugs    VITALS:  Blood pressure (!) 87/58, pulse 67, temperature 97.7 F (36.5 C), resp. rate 15, height 5\' 5"  (1.651 m), weight 60.4 kg, SpO2 100 %.  PHYSICAL EXAMINATION:   Physical Examlimited due to AMS  GENERAL:  87 y.o.-year-old patient lying in the bed with no acute distress.  HEENT: Head atraumatic, normocephalic. Oropharynx and nasopharynx clear.  LUNGS: Normal breath sounds bilaterally, no wheezing, rales, rhonchi. No use of accessory muscles of respiration.  CARDIOVASCULAR: S1, S2 normal. No murmurs, rubs, or gallops.  ABDOMEN: Soft, nontender, nondistended.  EXTREMITIES: No edema b/l.   HD access NEUROLOGIC: non-focal PSYCHIATRIC:  patient is awake and  communicative some, confused at baseline SKIN: per RN  LABORATORY PANEL:  CBC Recent Labs  Lab 05/18/21 1545  WBC 5.7  HGB 13.6  HCT 40.2  PLT 99*     Chemistries  Recent Labs  Lab 05/16/21 2207 05/18/21 1545  NA 136 140  K 4.6 4.7  CL 95* 101  CO2 32 30  GLUCOSE 195* 133*  BUN 17 35*  CREATININE 2.54* 3.82*  CALCIUM 8.3* 9.3  AST 45*  --   ALT 22  --   ALKPHOS 66  --   BILITOT 1.1  --     Cardiac Enzymes No results for input(s): TROPONINI in the last 168 hours. RADIOLOGY:  No results found. ASSESSMENT AND PLAN:   Brianna Dodson is a 87 y.o. female with medical history significant for ESRD on HD MWF, anemia of CKD, paroxysmal A. fib on Eliquis, DM, Alzheimer's disease, OSA, HTN, DM, recurrent strokes, who was brought in  by EMS for agitation and altered mental status.  EMS had been called out to the home 3 times in the past few days for altered mental status but family declined transport to the emergency room.  Patient went to dialysis earlier in the day but became agitated on her arrival back home and this time family agreed for her to be brought in.    Chest x-ray nonacute Head CT with chronic right MCA infarct chronic atrophic and ischemic changes.  Acute encephalopathy suspected metabolic with malignant hypertension Alzheimer's dementia along with vascular dementia acute delirium-- unclear etiology -- IV PRN labetalol -- clonidine patch -- patient not awake enough to take her multiple PO blood pressure meds -- per family patient started getting agitated/delirious after she came back from dialysis yesterday -- in the ER patient was lethargic. Unable to hold any conversation. -- CT head negative for acute abnormality. Shows chronic stroke and chronic small vessel ischemic changes. -- Will try get MRI of the brain-- discussed with granddaughter on the phone -- PRN Haldol --11/16--received ativan earlier for MRI brain--Neg for acute CVA --11/17--pt awake but non verbal. Remains calm.not eating. RN unable to give po meds --not on any sedating meds. --overall declining in the last 2-3 days. Palliative care consult placed. --11/18-- no improvement in mental status -- EEG suggestive of cortical dysfunction arising from  left frontotemporal region, nonspecific etiology but could be secondary to underlying structural abnormality, post-ictal state. Additionally there is moderate diffuse encephalopathy, nonspecific etiology. No seizures or epileptiform discharges were seen throughout the recording --Dr Brianna Dodson recommends keppra trial to see if there is any improvement in MS. --Palliative care to speak/meet with family today. --11/19-- patient more awake. Ate breakfast. Taking meds. Per RN lost her IV. --11/20--no new  issues. PT to see pt again --11/21--more alert, seen at HD  End-stage renal disease on hemodialysis Monday Wednesday Friday -- nephrology consultation with Dr. Juleen Dodson --tolerating HD  type II diabetes with renal failure -- d/c SSI -- A1c 5.5  history of CVA with vascular dementia -- 11/16--MRI of the brain--no acute CVA, Chronic CVA's  Hyperlipidemia -- on statins and Zetia  HTN -- carvedilol, losartan  Overall poor prognosis 11/18-palliative care spoke with Brianna Dodson patient's husband wants everything done. Patient is a full code  Procedures: Family communication :Spoke with Brianna Dodson on the phone  Consults : nephrology, Neurology CODE STATUS: full DVT Prophylaxis : heparin Level of care: Med-Surg Status is: Inpatient  Remains inpatient appropriate because: d/c planning spoke with [name] on the phone. TOC for discharge planning to rehab.   TOTAL TIME TAKING CARE OF THIS PATIENT:25 minutes.  >50% time spent on counselling and coordination of care  Note: This dictation was prepared with Dragon dictation along with smaller phrase technology. Any transcriptional errors that result from this process are unintentional.  Brianna Dodson M.D    [facility]   CC: Primary care physician; Brianna Haven, MD [name], female   DOB: 04-19-1934, 87 y.o.   MRN: [id number]

## 2021-05-24 DIAGNOSIS — G9341 Metabolic encephalopathy: Secondary | ICD-10-CM | POA: Diagnosis not present

## 2021-05-24 DIAGNOSIS — D696 Thrombocytopenia, unspecified: Secondary | ICD-10-CM | POA: Diagnosis not present

## 2021-05-24 DIAGNOSIS — I639 Cerebral infarction, unspecified: Secondary | ICD-10-CM | POA: Diagnosis not present

## 2021-05-24 DIAGNOSIS — R569 Unspecified convulsions: Secondary | ICD-10-CM | POA: Diagnosis not present

## 2021-05-24 LAB — GLUCOSE, CAPILLARY
Glucose-Capillary: 142 mg/dL — ABNORMAL HIGH (ref 70–99)
Glucose-Capillary: 161 mg/dL — ABNORMAL HIGH (ref 70–99)
Glucose-Capillary: 178 mg/dL — ABNORMAL HIGH (ref 70–99)
Glucose-Capillary: 218 mg/dL — ABNORMAL HIGH (ref 70–99)

## 2021-05-24 LAB — HEPATITIS B CORE ANTIBODY, TOTAL: Hep B Core Total Ab: NONREACTIVE

## 2021-05-24 MED ORDER — HALOPERIDOL LACTATE 5 MG/ML IJ SOLN: 1.0000 mg | Freq: Three times a day (TID) | INTRAMUSCULAR | Status: DC | PRN

## 2021-05-24 MED ORDER — MIRTAZAPINE 15 MG PO TBDP
7.5000 mg | ORAL_TABLET | Freq: Every day | ORAL | Status: DC
  Administered 2021-05-24 – 2021-06-02 (×10): 7.5 mg via ORAL
  Filled 2021-05-24 (×13): qty 0.5

## 2021-05-24 MED ORDER — RISPERIDONE 1 MG PO TBDP
0.5000 mg | ORAL_TABLET | Freq: Four times a day (QID) | ORAL | Status: DC | PRN
  Administered 2021-05-25 – 2021-05-26 (×2): 0.5 mg via ORAL
  Filled 2021-05-24 (×5): qty 0.5

## 2021-05-24 MED ORDER — HALOPERIDOL LACTATE 5 MG/ML IJ SOLN: 1.0000 mg | Freq: Every day | INTRAMUSCULAR | Status: DC | PRN

## 2021-05-24 MED ORDER — RISPERIDONE 1 MG PO TBDP
0.5000 mg | ORAL_TABLET | Freq: Every day | ORAL | Status: DC
  Administered 2021-05-24 – 2021-06-01 (×9): 0.5 mg via ORAL
  Filled 2021-05-24 (×13): qty 0.5

## 2021-05-24 NOTE — TOC Progression Note (Signed)
Transition of Care [facility]) - Progression Note    Patient Details  Name: Brianna Dodson MRN: [id number] Date of Birth: Jan 31, 1934  Transition of [facility]) CM/SW Contact  Beverly Sessions, RN Phone Number: 05/24/2021, 12:21 PM  Clinical Narrative:     HD coordinator confirms that [facility] at [place] confirms they can transport to that center  Bed offer presented to sister [name].  She request that I call her back at 115 pm after she talks with her family   Expected Discharge Plan: [place] Barriers to Discharge: Continued Medical Work up  Expected Discharge Plan and Services Expected Discharge Plan: [place] Acute Care Choice: Resumption of Svcs/PTA Provider Living arrangements for the past 2 months: Single Family Home                                       Social Determinants of Health (SDOH) Interventions    Readmission Risk Interventions Readmission Risk Prevention Plan 05/18/2021 03/03/2021 03/16/2019  Transportation Screening Complete Complete Complete  PCP or Specialist Appt within 5-7 Days - - Complete  PCP or Specialist Appt within 3-5 Days Complete Complete -  Home Care Screening - - Complete  Medication Review (RN CM) - - Complete  HRI or Home Care Consult Complete Complete -  Social Work Consult for [place] Planning/Counseling Complete Not Complete -  SW consult not completed comments - RNCM assigned to patient -  Palliative Care Screening Not Applicable Not Applicable -  Medication Review (RN Care Manager) Complete Complete -  Some recent data might be hidden

## 2021-05-24 NOTE — Progress Notes (Signed)
[facility] [place] NAME: Brianna Dodson    MR#:  [id number]  DATE OF BIRTH:  [birth year]  SUBJECTIVE:  per RN this morning she was restless and gave Haldol.   REVIEW OF SYSTEMS:   Review of Systems  Unable to perform ROS: Mental status change  Tolerating Diet: yes Tolerating PT: rehab  DRUG ALLERGIES:   Allergies  Allergen Reactions   Nsaids     CKD stage III - Avoid all nephrotoxic drugs    VITALS:  Blood pressure (!) 127/58, pulse 62, temperature 97.6 F (36.4 C), resp. rate 18, height 5\' 5"  (1.651 m), weight 60.4 kg, SpO2 99 %.  PHYSICAL EXAMINATION:   Physical Examlimited due to AMS  GENERAL:  87 y.o.-year-old patient lying in the bed with no acute distress.  HEENT: Head atraumatic, normocephalic. Oropharynx and nasopharynx clear.  LUNGS: Normal breath sounds bilaterally, no wheezing, rales, rhonchi. No use of accessory muscles of respiration.  CARDIOVASCULAR: S1, S2 normal. No murmurs, rubs, or gallops.  ABDOMEN: Soft, nontender, nondistended.  EXTREMITIES: No edema b/l.   HD access NEUROLOGIC: non-focal PSYCHIATRIC:  patient is sedated from Haldol given earlier  SKIN:  Pressure Injury 03/17/21 Coccyx Posterior Stage 2 -  Partial thickness loss of dermis presenting as a shallow open injury with a red, pink wound bed without slough. (Active)  03/17/21 1743  Location: Coccyx  Location Orientation: Posterior  Staging: Stage 2 -  Partial thickness loss of dermis presenting as a shallow open injury with a red, pink wound bed without slough.  Wound Description (Comments):   Present on Admission: Yes     Pressure Injury 05/24/21 Sacrum Mid Stage 2 -  Partial thickness loss of dermis presenting as a shallow open injury with a red, pink wound bed without slough. (Active)  05/24/21 0630  Location: Sacrum  Location Orientation: Mid  Staging: Stage 2 -  Partial thickness loss of dermis presenting as a shallow open injury with a  red, pink wound bed without slough.  Wound Description (Comments):   Present on Admission: -- (unsure)        LABORATORY PANEL:  CBC Recent Labs  Lab 05/18/21 1545  WBC 5.7  HGB 13.6  HCT 40.2  PLT 99*     Chemistries  Recent Labs  Lab 05/18/21 1545  NA 140  K 4.7  CL 101  CO2 30  GLUCOSE 133*  BUN 35*  CREATININE 3.82*  CALCIUM 9.3    Cardiac Enzymes No results for input(s): TROPONINI in the last 168 hours. RADIOLOGY:  No results found. ASSESSMENT AND PLAN:   Brianna Dodson is a 87 y.o. female with medical history significant for ESRD on HD MWF, anemia of CKD, paroxysmal A. fib on Eliquis, DM, Alzheimer's disease, OSA, HTN, DM, recurrent strokes, who was brought in by EMS for agitation and altered mental status.  EMS had been called out to the home 3 times in the past few days for altered mental status but family declined transport to the emergency room.  Patient went to dialysis earlier in the day but became agitated on her arrival back home and this time family agreed for her to be brought in.    Chest x-ray nonacute Head CT with chronic right MCA infarct chronic atrophic and ischemic changes.  Acute encephalopathy suspected metabolic with malignant hypertension Alzheimer's dementia along with vascular dementia acute delirium-- unclear etiology -- IV PRN labetalol -- clonidine patch -- patient not awake enough to  take her multiple PO blood pressure meds -- per family patient started getting agitated/delirious after she came back from dialysis yesterday -- in the ER patient was lethargic. Unable to hold any conversation. -- CT head negative for acute abnormality. Shows chronic stroke and chronic small vessel ischemic changes. -- Will try get MRI of the brain-- discussed with granddaughter on the phone -- PRN Haldol --11/16--received ativan earlier for MRI brain--Neg for acute CVA --11/17--pt awake but non verbal. Remains calm.not eating. RN unable to give po  meds --not on any sedating meds. --overall declining in the last 2-3 days. Palliative care consult placed. --11/18-- no improvement in mental status -- EEG suggestive of cortical dysfunction arising from  left frontotemporal region, nonspecific etiology but could be secondary to underlying structural abnormality, post-ictal state. Additionally there is moderate diffuse encephalopathy, nonspecific etiology. No seizures or epileptiform discharges were seen throughout the recording --Dr Rory Percy recommends keppra trial to see if there is any improvement in MS. --Palliative care to speak/meet with family today. --11/19-- patient more awake. Ate breakfast. Taking meds. Per RN lost her IV. --11/20--no new issues. PT to see pt again --11/21--more alert, seen at HD --11/22--got haldol this am. Resuming remeron (home meds) --given behavioral issues I have asked Dr Weber Cooks to see pt  End-stage renal disease on hemodialysis Monday Wednesday Friday -- nephrology consultation with Dr. Juleen China --tolerating HD  type II diabetes with renal failure -- d/c SSI -- A1c 5.5  history of CVA with vascular dementia -- 11/16--MRI of the brain--no acute CVA, Chronic CVA's  Hyperlipidemia -- on statins and Zetia  HTN -- carvedilol, losartan  Overall poor prognosis 11/18-palliative care spoke with Brianna Dodson patient's husband wants everything done. Patient is a full code  Procedures: Family communication :Spoke with Brianna Dodson on the phone 11/22 Consults : nephrology, Neurology CODE STATUS: full DVT Prophylaxis : heparin Level of care: Med-Surg Status is: Inpatient  Remains inpatient appropriate because: d/c planning spoke with [name] on the phone. TOC for discharge planning to rehab.  D/c planning -- per TOC only one bed offer in [place] PATIENT:25 minutes.  >50% time spent on counselling and coordination of care  Note: This dictation was  prepared with Dragon dictation along with smaller phrase technology. Any transcriptional errors that result from this process are unintentional.  Brianna Dodson M.D    [facility]   CC: Primary care physician; Brianna Haven, MD [name], female   DOB: 11-08-1933, 87 y.o.   MRN: [id number]

## 2021-05-24 NOTE — Consult Note (Signed)
[facility] Face-to-Face Psychiatry Consult   Reason for Consult: Consult for this 87 year old woman with severe neurologic injury suffering for some agitation Referring Physician: Posey Pronto Patient Identification: Brianna Dodson MRN:  [id number] Principal Diagnosis: Agitation Diagnosis:  Principal Problem:   Agitation Active Problems:   Essential hypertension   Type II diabetes mellitus with renal manifestations (Brianna Dodson)   Atrial fibrillation (Brianna Dodson)   Mixed Alzheimer's and vascular dementia (Brianna Dodson)   End stage renal disease on dialysis ([place])   Recurrent strokes (Brianna Dodson)   Thrombocytopenia ([place])   Chronic anticoagulation   Pressure injury of skin   Hypertensive urgency   Post-ictal state (Brianna Dodson)   Total Time spent with patient: 1 hour  Subjective:   Brianna Dodson is a 87 y.o. female patient admitted with "I want to go home".  HPI: Patient seen chart reviewed.  87 year old woman with a history of major strokes is in the hospital with agitation.  Patient continues to have some intermittent agitation during the day.  Nursing tells me it is worse at night but that even in the morning she will sometimes get agitated and throw things.  On interview the patient has no memory of this or will not discuss it.  She only wants to tell me that she wants to go home.  Denies being depressed.  Denies hallucinations.  She does say she has pain on her backside but otherwise no specific complaints.  Denies hallucinations.  Does not appear to be obviously psychotic  Past Psychiatric History: Past history of dementia probably largely vascular although at her age certainly can be contribution from Alzheimer's as well.  Patient multiple medical problems on dialysis.  No other psychiatric history.  Risk to Self:   Risk to Others:   Prior Inpatient Therapy:   Prior Outpatient Therapy:    Past Medical History:  Past Medical History:  Diagnosis Date   A-fib ([place])    Alzheimer disease (Brianna Dodson)    Anemia    Carotid stenosis     Cerebral infarction ([place]) 04/12/2019   Multiple new foci of acute infarction affect the RIGHT hemisphere affecting the frontal, posterior frontal, posterior temporal, anterior parietal cortex and regional white matter.   Cerebral infarction due to embolism of right middle cerebral artery (Brianna Dodson) 08/20/2019   RIGHT MCA distal M1   Chronic anticoagulation    Apixaban   CKD (chronic kidney disease), stage 4(Brianna Dodson)    Embolic stroke involving cerebral artery (Brianna Dodson) 03/15/2019   RIGHT MCA/PCA territory   Encephalomalacia    RIGHT posterior MCA territory   HCV (hepatitis C virus)    History of blood transfusion    Hyperlipidemia    Hypertension    OSA (obstructive sleep apnea)    Status post placement of implantable loop recorder 07/30/2019   T2DM (type 2 diabetes mellitus) [facility])     Past Surgical History:  Procedure Laterality Date   ABDOMINAL HYSTERECTOMY  07/04/1983   AV FISTULA PLACEMENT Left 12/02/2020   Procedure: INSERTION OF ARTERIOVENOUS (AV)  FISTULA  ARM ( BRACHIO- CEPHALIC);  Surgeon: Algernon Huxley, MD;  Location: [facility] ORS;  Service: Vascular;  Laterality: Left;   AV FISTULA PLACEMENT Left 02/24/2021   Procedure: INSERTION OF ARTERIOVENOUS (AV) GORE-TEX GRAFT ARM;  Surgeon: Algernon Huxley, MD;  Location: [facility] ORS;  Service: Vascular;  Laterality: Left;  Left brachial Ax graft   CATARACT EXTRACTION, BILATERAL Bilateral    DIALYSIS/PERMA CATHETER INSERTION N/A 10/06/2020   Procedure: DIALYSIS/PERMA CATHETER INSERTION;  Surgeon: Algernon Huxley, MD;  Location:  [place] [facility];  Service: Cardiovascular;  Laterality: N/A;   LIGATION OF ARTERIOVENOUS  FISTULA Left 02/26/2021   Procedure: LIGATION OF ARTERIOVENOUS  FISTULA;  Surgeon: Bertram Savin, MD;  Location: [facility] ORS;  Service: Vascular;  Laterality: Left;   LOOP RECORDER INSERTION N/A 07/30/2019   Procedure: LOOP RECORDER INSERTION;  Surgeon: Isaias Cowman, MD;  Location: [place] [facility];  Service: Cardiovascular;   Laterality: N/A;   TEE WITHOUT CARDIOVERSION N/A 04/14/2019   Procedure: TRANSESOPHAGEAL ECHOCARDIOGRAM (TEE);  Surgeon: Teodoro Spray, MD;  Location: [facility] ORS;  Service: Cardiovascular;  Laterality: N/A;   Family History:  Family History  Problem Relation Age of Onset   Stroke Mother    Diabetes Mother    Heart disease Father    Kidney disease Sister    Diabetes Sister    Kidney disease Brother    Heart disease Sister    Diabetes Sister    Diabetes Sister    Diabetes Sister    Kidney disease Sister    Heart disease Sister    Kidney disease Brother        kidney transplant   Early death Brother 85       Truck Accident - died   Heart disease Brother    Family Psychiatric  History: See previous Social History:  Social History   Substance and Sexual Activity  Alcohol Use No     Social History   Substance and Sexual Activity  Drug Use No    Social History   Socioeconomic History   Marital status: Married    Spouse name: Hubbard Robinson   Number of children: 2   Years of education: Not on file   Highest education level: Not on file  Occupational History   Occupation: [employer]    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 Retired  Tobacco Use   Smoking status: Never   Smokeless tobacco: Never  Vaping Use   Vaping Use: Never used  Substance and Sexual Activity   Alcohol use: No   Drug use: No   Sexual activity: Not Currently  Other Topics Concern   Not on file  Social History Narrative   She is married and has two adult children (adopted).    Hobbies: Decorating   Exercise: None   Caffeine: 4 cups a week   Social Determinants of Radio broadcast assistant Strain: Low Risk    Difficulty of Paying Living Expenses: Not very hard  Food Insecurity: Not on file  Transportation Needs: Not on file  Physical Activity: Not on file  Stress: Not on file  Social Connections: Not on file   Additional Social History:    Allergies:   Allergies  Allergen Reactions    Nsaids     CKD stage III - Avoid all nephrotoxic drugs    Labs:  Results for orders placed or performed during the hospital encounter of 05/16/21 (from the past 48 hour(s))  Glucose, capillary     Status: Abnormal   Collection Time: 05/22/21  8:48 PM  Result Value Ref Range   Glucose-Capillary 136 (H) 70 - 99 mg/dL    Comment: Glucose reference range applies only to samples taken after fasting for at least 8 hours.  Glucose, capillary     Status: Abnormal   Collection Time: 05/23/21  7:55 AM  Result Value Ref Range   Glucose-Capillary 138 (H) 70 - 99 mg/dL    Comment: Glucose reference range applies only to samples taken after fasting for at  least 8 hours.  Glucose, capillary     Status: Abnormal   Collection Time: 05/23/21  4:50 PM  Result Value Ref Range   Glucose-Capillary 129 (H) 70 - 99 mg/dL    Comment: Glucose reference range applies only to samples taken after fasting for at least 8 hours.  Glucose, capillary     Status: Abnormal   Collection Time: 05/23/21  8:49 PM  Result Value Ref Range   Glucose-Capillary 189 (H) 70 - 99 mg/dL    Comment: Glucose reference range applies only to samples taken after fasting for at least 8 hours.  Glucose, capillary     Status: Abnormal   Collection Time: 05/24/21  7:31 AM  Result Value Ref Range   Glucose-Capillary 142 (H) 70 - 99 mg/dL    Comment: Glucose reference range applies only to samples taken after fasting for at least 8 hours.  Glucose, capillary     Status: Abnormal   Collection Time: 05/24/21 11:28 AM  Result Value Ref Range   Glucose-Capillary 161 (H) 70 - 99 mg/dL    Comment: Glucose reference range applies only to samples taken after fasting for at least 8 hours.    Current Facility-Administered Medications  Medication Dose Route Frequency Provider Last Rate Last Admin   acetaminophen (TYLENOL) tablet 650 mg  650 mg Oral Q6H PRN Athena Masse, MD   650 mg at 05/23/21 1454   Or   acetaminophen (TYLENOL) suppository  650 mg  650 mg Rectal Q6H PRN Athena Masse, MD       apixaban Arne [place]) tablet 2.5 mg  2.5 mg Oral BID Fritzi Mandes, MD   2.5 mg at 05/24/21 2831   ascorbic acid (VITAMIN C) tablet 500 mg  500 mg Oral Daily Fritzi Mandes, MD   500 mg at 05/24/21 0809   atorvastatin (LIPITOR) tablet 80 mg  80 mg Oral Daily Fritzi Mandes, MD   80 mg at 05/24/21 0809   carvedilol (COREG) tablet 6.25 mg  6.25 mg Oral BID WC Fritzi Mandes, MD   6.25 mg at 05/24/21 1608   Chlorhexidine Gluconate Cloth 2 % PADS 6 each  6 each Topical Daily Athena Masse, MD   6 each at 05/24/21 0810   Chlorhexidine Gluconate Cloth 2 % PADS 6 each  6 each Topical Q0600 Lavonia Dana, MD   6 each at 05/24/21 0641   ezetimibe (ZETIA) tablet 10 mg  10 mg Oral Daily Fritzi Mandes, MD   10 mg at 05/24/21 0809   ferrous gluconate (FERGON) tablet 324 mg  324 mg Oral Q1500 Fritzi Mandes, MD   324 mg at 05/24/21 1608   haloperidol lactate (HALDOL) injection 1 mg  1 mg Intramuscular Daily PRN Fritzi Mandes, MD       labetalol (NORMODYNE) injection 10 mg  10 mg Intravenous Q2H PRN Fritzi Mandes, MD   10 mg at 05/18/21 5176   levETIRAcetam (KEPPRA) tablet 250 mg  250 mg Oral Once per day on Mon Wed Fri Murlean Iba, MD   250 mg at 05/23/21 2058   levETIRAcetam (KEPPRA) tablet 500 mg  500 mg Oral BID Fritzi Mandes, MD   500 mg at 05/24/21 0809   losartan (COZAAR) tablet 100 mg  100 mg Oral QHS Murlean Iba, MD       MEDLINE mouth rinse  15 mL Mouth Rinse BID Fritzi Mandes, MD   15 mL at 05/24/21 0810   mirtazapine (REMERON SOL-TAB) disintegrating tablet 7.5 mg  7.5 mg  Oral QHS Fritzi Mandes, MD       multivitamin (RENA-VIT) tablet 1 tablet  1 tablet Oral QHS Fritzi Mandes, MD   1 tablet at 05/23/21 2056   ondansetron [facility]) tablet 4 mg  4 mg Oral Q6H PRN Athena Masse, MD       Or   ondansetron [facility]) injection 4 mg  4 mg Intravenous Q6H PRN Athena Masse, MD       risperiDONE (RISPERDAL M-TABS) disintegrating tablet 0.5 mg  0.5 mg Oral QHS Jaylnn Ullery  T, MD       risperiDONE (RISPERDAL M-TABS) disintegrating tablet 0.5 mg  0.5 mg Oral Q6H PRN Yulisa Chirico, Madie Reno, MD       senna-docusate (Senokot-S) tablet 1 tablet  1 tablet Oral QHS PRN Fritzi Mandes, MD        Musculoskeletal: Strength & Muscle Tone: within normal limits Gait & Station: unable to stand Patient leans: N/A            Psychiatric Specialty Exam:  Presentation  General Appearance: No data recorded Eye Contact:No data recorded Speech:No data recorded Speech Volume:No data recorded Handedness:No data recorded  Mood and Affect  Mood:No data recorded Affect:No data recorded  Thought Process  Thought Processes:No data recorded Descriptions of Associations:No data recorded Orientation:No data recorded Thought Content:No data recorded History of Schizophrenia/Schizoaffective disorder:No data recorded Duration of Psychotic Symptoms:No data recorded Hallucinations:No data recorded Ideas of Reference:No data recorded Suicidal Thoughts:No data recorded Homicidal Thoughts:No data recorded  Sensorium  Memory:No data recorded Judgment:No data recorded Insight:No data recorded  Executive Functions  Concentration:No data recorded Attention Span:No data recorded Recall:No data recorded Fund of Knowledge:No data recorded Language:No data recorded  Psychomotor Activity  Psychomotor Activity:No data recorded  Assets  Assets:No data recorded  Sleep  Sleep:No data recorded  Physical Exam: Physical Exam Vitals and nursing note reviewed.  Constitutional:      Appearance: Normal appearance.  HENT:     Head: Normocephalic and atraumatic.     Mouth/Throat:     Pharynx: Oropharynx is clear.  Eyes:     Pupils: Pupils are equal, round, and reactive to light.  Cardiovascular:     Rate and Rhythm: Normal rate and regular rhythm.  Pulmonary:     Effort: Pulmonary effort is normal.     Breath sounds: Normal breath sounds.  Abdominal:     General: Abdomen is  flat.     Palpations: Abdomen is soft.  Musculoskeletal:        General: Normal range of motion.  Skin:    General: Skin is warm and dry.  Neurological:     Mental Status: She is alert. Mental status is at baseline.  Psychiatric:        Attention and Perception: She is inattentive.        Mood and Affect: Mood normal. Affect is blunt.        Speech: Speech is delayed.        Behavior: Behavior is slowed.        Thought Content: Thought content normal.        Cognition and Memory: Cognition is impaired.   Review of Systems  Constitutional: Negative.   HENT: Negative.    Eyes: Negative.   Respiratory: Negative.    Cardiovascular: Negative.   Gastrointestinal: Negative.   Musculoskeletal:  Positive for back pain.  Skin: Negative.   Neurological: Negative.   Psychiatric/Behavioral: Negative.    Blood pressure (!) 122/58, pulse 66, temperature 97.6  F (36.4 C), resp. rate 18, height 5\' 5"  (1.651 m), weight 60.4 kg, SpO2 98 %. Body mass index is 22.16 kg/m.  Treatment Plan Summary: Medication management and Plan treatment team is seeking a way to try to control her agitation without the use of IM or IV medications so that she can be discharged to a living facility.  Orders placed for Risperdal dissolving tablets 0.5 mg at night and also 0.5 every 6 hours as needed during the day for agitation.  Probably better choice overall than benzodiazepines.  Continue medicines for possible seizures.  Disposition: Patient does not meet criteria for psychiatric inpatient admission. Supportive therapy provided about ongoing stressors.  Alethia Berthold, MD 05/24/2021 5:14 PM

## 2021-05-24 NOTE — TOC Progression Note (Signed)
Transition of Care [facility]) - Progression Note    Patient Details  Name: Mali Eppard MRN: [id number] Date of Birth: 10-28-1933  [facility] CM/SW Contact  Beverly Sessions, RN Phone Number: 05/24/2021, 3:27 PM  Clinical Narrative:     3 way call completed with sister [name] and daughter Blanch Media.  They have accepted bed at [facility], accepted bed in [place], notified debbie at [facility] HD coordinator to change HD center Patient to attempt to sit in chair for HD tomorrow   Expected Discharge Plan: [place] Barriers to Discharge: Continued Medical Work up  Expected Discharge Plan and Services Expected Discharge Plan: [place] Acute Care Choice: Resumption of Svcs/PTA Provider Living arrangements for the past 2 months: Single Family Home                                       Social Determinants of Health (SDOH) Interventions    Readmission Risk Interventions Readmission Risk Prevention Plan 05/18/2021 03/03/2021 03/16/2019  Transportation Screening Complete Complete Complete  PCP or Specialist Appt within 5-7 Days - - Complete  PCP or Specialist Appt within 3-5 Days Complete Complete -  Home Care Screening - - Complete  Medication Review (RN CM) - - Complete  HRI or Home Care Consult Complete Complete -  Social Work Consult for [place] Planning/Counseling Complete Not Complete -  SW consult not completed comments - RNCM assigned to patient -  Palliative Care Screening Not Applicable Not Applicable -  Medication Review (RN Care Manager) Complete Complete -  Some recent data might be hidden

## 2021-05-24 NOTE — Progress Notes (Signed)
[facility]  ROUNDING NOTE   Subjective:   Patient seen resting in bed, alert with some confusion Denies pain or discomfort Denies shortness of breath Repeatedly requesting to call sister Patient seen later in morning sitting up in chair Alert and more oriented   Objective:  Vital signs in last 24 hours:  Temp:  [97.4 F (36.3 C)-98.1 F (36.7 C)] 97.6 F (36.4 C) (11/22 0731) Pulse Rate:  [57-76] 62 (11/22 0731) Resp:  [13-21] 18 (11/22 0624) BP: (113-152)/(53-79) 127/58 (11/22 0731) SpO2:  [98 %-100 %] 99 % (11/22 0731)  Weight change:  Filed Weights   05/17/21 1251 05/18/21 1545  Weight: 66 kg 60.4 kg    Intake/Output: I/O last 3 completed shifts: In: 240 [P.O.:240] Out: -    Intake/Output this shift:  No intake/output data recorded.  Physical Exam: General: Sitting in chair  Head: Normocephalic, atraumatic. Moist oral mucosal membranes  Lungs:  Clear to auscultation, normal effort  Heart: Regular rate and rhythm  Abdomen:  Soft, nontender, nondistended  Extremities:  No peripheral edema.  Neurologic: Alert to self and place,  Left sided weakness.   Skin: No lesions  Access: Right IJ permcath    Basic Metabolic Panel: Recent Labs  Lab 05/18/21 1545  NA 140  K 4.7  CL 101  CO2 30  GLUCOSE 133*  BUN 35*  CREATININE 3.82*  CALCIUM 9.3  PHOS 5.4*     Liver Function Tests: Recent Labs  Lab 05/18/21 1545  ALBUMIN 3.4*    No results for input(s): LIPASE, AMYLASE in the last 168 hours. Recent Labs  Lab 05/20/21 1128  AMMONIA 13     CBC: Recent Labs  Lab 05/18/21 1545  WBC 5.7  HGB 13.6  HCT 40.2  MCV 86.3  PLT 99*     Cardiac Enzymes: No results for input(s): CKTOTAL, CKMB, CKMBINDEX, TROPONINI in the last 168 hours.  BNP: Invalid input(s): POCBNP  CBG: Recent Labs  Lab 05/23/21 0755 05/23/21 1650 05/23/21 2049 05/24/21 0731 05/24/21 1128  GLUCAP 138* 129* 189* 142* 161*     Microbiology: Results  for orders placed or performed during the hospital encounter of 05/16/21  Resp Panel by RT-PCR (Flu A&B, Covid) Nasopharyngeal Swab     Status: None   Collection Time: 05/16/21 10:07 PM   Specimen: Nasopharyngeal Swab; Nasopharyngeal(NP) swabs in vial transport medium  Result Value Ref Range Status   SARS Coronavirus 2 by RT PCR NEGATIVE NEGATIVE Final    Comment: (NOTE) SARS-CoV-2 target nucleic acids are NOT DETECTED.  The SARS-CoV-2 RNA is generally detectable in upper respiratory specimens during the acute phase of infection. The lowest concentration of SARS-CoV-2 viral copies this assay can detect is 138 copies/mL. A negative result does not preclude SARS-Cov-2 infection and should not be used as the sole basis for treatment or other patient management decisions. A negative result may occur with  improper specimen collection/handling, submission of specimen other than nasopharyngeal swab, presence of viral mutation(s) within the areas targeted by this assay, and inadequate number of viral copies(<138 copies/mL). A negative result must be combined with clinical observations, patient history, and epidemiological information. The expected result is Negative.  Fact Sheet for Patients:  EntrepreneurPulse.com.au  Fact Sheet for Healthcare Providers:  IncredibleEmployment.be  This test is no t yet approved or cleared by the [place] FDA and  has been authorized for detection and/or diagnosis of SARS-CoV-2 by FDA under an Emergency Use Authorization (EUA). This EUA will remain  in  effect (meaning this test can be used) for the duration of the COVID-19 declaration under Section 564(b)(1) of the Act, 21 U.S.C.section 360bbb-3(b)(1), unless the authorization is terminated  or revoked sooner.       Influenza A by PCR NEGATIVE NEGATIVE Final   Influenza B by PCR NEGATIVE NEGATIVE Final    Comment: (NOTE) The Xpert Xpress SARS-CoV-2/FLU/RSV plus  assay is intended as an aid in the diagnosis of influenza from Nasopharyngeal swab specimens and should not be used as a sole basis for treatment. Nasal washings and aspirates are unacceptable for Xpert Xpress SARS-CoV-2/FLU/RSV testing.  Fact Sheet for Patients: EntrepreneurPulse.com.au  Fact Sheet for Healthcare Providers: IncredibleEmployment.be  This test is not yet approved or cleared by the [place] FDA and has been authorized for detection and/or diagnosis of SARS-CoV-2 by FDA under an Emergency Use Authorization (EUA). This EUA will remain in effect (meaning this test can be used) for the duration of the COVID-19 declaration under Section 564(b)(1) of the Act, 21 U.S.C. section 360bbb-3(b)(1), unless the authorization is terminated or revoked.  Performed at [facility], [address], [place], [place] [address]   Urine Culture     Status: Abnormal   Collection Time: 05/16/21 10:07 PM   Specimen: In/Out Cath Urine  Result Value Ref Range Status   Specimen Description   Final    IN/OUT CATH URINE Performed at [facility], [address], [place], [place] [address]    Special Requests   Final    NONE Performed at [facility], [address] [place], [place] [address]    Culture (A)  Final    >=100,000 COLONIES/mL GARDNERELLA VAGINALIS Standardized susceptibility testing for this organism is not available. 80,000 COLONIES/mL DIPHTHEROIDS(CORYNEBACTERIUM SPECIES)    Report Status 05/19/2021 FINAL  Final    Coagulation Studies: No results for input(s): LABPROT, INR in the last 72 hours.  Urinalysis: No results for input(s): COLORURINE, LABSPEC, PHURINE, GLUCOSEU, HGBUR, BILIRUBINUR, KETONESUR, PROTEINUR, UROBILINOGEN, NITRITE, LEUKOCYTESUR in the last 72 hours.  Invalid input(s): APPERANCEUR     Imaging: No results found.   Medications:      apixaban  2.5 mg Oral BID   ascorbic  acid  500 mg Oral Daily   atorvastatin  80 mg Oral Daily   carvedilol  6.25 mg Oral BID WC   Chlorhexidine Gluconate Cloth  6 each Topical Daily   Chlorhexidine Gluconate Cloth  6 each Topical Q0600   ezetimibe  10 mg Oral Daily   ferrous gluconate  324 mg Oral Q1500   levETIRAcetam  250 mg Oral Once per day on Mon Wed Fri   levETIRAcetam  500 mg Oral BID   losartan  100 mg Oral QHS   mouth rinse  15 mL Mouth Rinse BID   mirtazapine  7.5 mg Oral QHS   multivitamin  1 tablet Oral QHS   acetaminophen **OR** acetaminophen, haloperidol lactate, labetalol, ondansetron **OR** ondansetron (ZOFRAN) IV, senna-docusate  Assessment/ Plan:  Ms. Gracianna Vink is a 87 y.o. black female with end stage renal disease on hemodialysis, hypertension, CVA, vascular dementia, hyperlipidemia, diabetes mellitus type II, atrial fibrillation who is admitted to [facility] on 05/16/2021 for Confusion [R41.0] Agitation [R45.1]   [facility] MWF [facility] [address] RIJ permcath 65.5kg   End Stage Renal Disease:  -Received dialysis yesterday, tolerated treatment well.  We will schedule dialysis for tomorrow to maintain outpatient schedule.   Altered mental status:  - Continue levetiracetam at neurology recommendations of 500mg  twice  a day with additional 250 mg dose after dialysis.   Hypertension: with hypertension urgency on admission. Home regimen includes losartan, amlodipine, carvedilol, hydralazine.   - IV labatelol PRN  - started on carvedilol and losartan.  Currently amlodipine.  -Losartan to be administered at night.  Will monitor blood pressure during next session of dialysis   Anemia of chronic kidney disease: Normocytic. Mircera as outpatient. Outpatient iron levels at goal on 9/7.     Secondary Hyperparathyroidism: PTH, calcium and phosphorus at goal on 11/2. Not currently on binders. Will obtain updated labs in am    LOS: 7   11/22/202212:55 PM

## 2021-05-24 NOTE — Progress Notes (Signed)
Occupational Therapy Treatment Patient Details Name: Brianna Dodson MRN: [id number] DOB: 01/30/1934 Today's Date: 05/24/2021   History of present illness Pt is an 87 y/o F with PMH: multiple strokes most recent right MCA stroke in March 2022 with residual left-sided weakness, with worsening left-sided weakness after left upper extremity AV fistula surgery on 8/25 (Dr. [name]). Pt presented to ED d/t L arm pain/throbbing starting the night after the procedure. MRI reviewed by neurology and assessment as follows: "although formal radiology read is concerning for acute on chronic infarction it looks like cortical laminar necrosis and not like a new stroke.  She does have left hand grip weakness and left wrist drop which is new from before, but I do not suspect that this is from a new stroke." Pt now s/p ligation of L UE HD cath site to improve perfusion on 02/26/21.   OT comments  Ms. Greening seen for OT/ PT co-treatment on this date. Upon arrival to room pt lying in bed. Pt agreeable to tx. MOD A x 2 + MAX cues for functional mobility decreasing to MAX A +2 2/2 pt early sitting at chair(x2) and BSC(x1). Pt MAX A for dressing, uses R hand (not using L at baseline). Pt demonstrating confusion during tx, conversation sometimes unrelated to context. Pt making good progress toward goals. Pt continues to benefit from skilled OT services to maximize return to PLOF and minimize risk of future falls, injury, caregiver burden, and readmission. Will continue to follow POC. Discharge recommendation remains appropriate.     Recommendations for follow up therapy are one component of a multi-disciplinary discharge planning process, led by the attending physician.  Recommendations may be updated based on patient status, additional functional criteria and insurance authorization.    Follow Up Recommendations  Skilled nursing-short term rehab (<3 hours/day)    Assistance Recommended at Discharge Frequent or constant  Supervision/Assistance  Equipment Recommendations  Other (comment) (defer to next venue of care)    Recommendations for Other Services      Precautions / Restrictions Precautions Precautions: Fall Restrictions Weight Bearing Restrictions: No       Mobility Bed Mobility Overal bed mobility: Needs Assistance Bed Mobility: Supine to Sit     Supine to sit: Mod assist;+2 for physical assistance        Transfers Overall transfer level: Needs assistance Equipment used: 2 person hand held assist Transfers: Sit to/from Stand Sit to Stand: Mod assist;+2 physical assistance           General transfer comment: +2 for stand with heavy lean towards L side     Balance Overall balance assessment: Needs assistance Sitting-balance support: Feet supported;No upper extremity supported Sitting balance-Leahy Scale: Fair     Standing balance support: Bilateral upper extremity supported Standing balance-Leahy Scale: Poor                             ADL either performed or assessed with clinical judgement   ADL Overall ADL's : Needs assistance/impaired                                       General ADL Comments: MOD A x2 + MAX cues for functional mobility decreasing to MAX A +2 for early sit at chair x2 and BSC x1. Pt reports urge to urinate, but unable to produce on BSC. PT MAX A for  dressing, uses R hand (not using L at baseline).    Extremity/Trunk Assessment              Cognition Arousal/Alertness: Awake/alert Behavior During Therapy: Impulsive;Restless Overall Cognitive Status: History of cognitive impairments - at baseline                                 General Comments: pt confused during session, asking to go to living room and asking for phone          Exercises Exercises: Other exercises Other Exercises Other Exercises: Pt educ re: lap belt instruction provided, pt returned demonstrated ability to remove lap belt,  falls prevention Other Exercises: Sup>sit, sit<>stand, ambulate to door and back to chair, chair<>BSC, educ on seat belt   Shoulder Instructions       General Comments      Pertinent Vitals/ Pain       Pain Assessment: No/denies pain  Home Living                                          Prior Functioning/Environment              Frequency  Min 1X/week        Progress Toward Goals  OT Goals(current goals can now be found in the care plan section)  Progress towards OT goals: Progressing toward goals  Acute Rehab OT Goals Patient Stated Goal: to go to the living room OT Goal Formulation: With patient Time For Goal Achievement: 06/02/21 Potential to Achieve Goals: Fair ADL Goals Pt Will Perform Grooming: with min assist;sitting Pt Will Perform Lower Body Dressing: with mod assist;sitting/lateral leans Pt Will Transfer to Toilet: bedside commode;with mod assist;ambulating  Plan Discharge plan remains appropriate;Frequency remains appropriate    Co-evaluation      Reason for Co-Treatment: Complexity of the patient's impairments (multi-system involvement);Necessary to address cognition/behavior during functional activity;For patient/therapist safety;To address functional/ADL transfers PT goals addressed during session: Mobility/safety with mobility OT goals addressed during session: ADL's and self-care      AM-PAC OT "6 Clicks" Daily Activity     Outcome Measure   Help from another person eating meals?: A Lot Help from another person taking care of personal grooming?: A Lot Help from another person toileting, which includes using toliet, bedpan, or urinal?: A Lot Help from another person bathing (including washing, rinsing, drying)?: A Lot Help from another person to put on and taking off regular upper body clothing?: A Lot Help from another person to put on and taking off regular lower body clothing?: A Lot 6 Click Score: 12    End of  Session Equipment Utilized During Treatment: Gait belt  OT Visit Diagnosis: Unsteadiness on feet (R26.81);Other abnormalities of gait and mobility (R26.89);Feeding difficulties (R63.3)   Activity Tolerance Patient tolerated treatment well   Patient Left in chair;with call bell/phone within reach;with chair alarm set;Other (comment) (Lap belt in place)   Nurse Communication          Time: [phone] OT Time Calculation (min): 32 min  Charges: OT General Charges $OT Visit: 1 Visit OT Treatments $Self Care/Home Management : 8-22 mins  Nino Glow, Markus Daft 05/24/2021, 1:27 PM

## 2021-05-24 NOTE — Progress Notes (Signed)
Physical Therapy Treatment Patient Details Name: Brianna Dodson MRN: [id number] DOB: [birth year] Today's Date: 05/24/2021   History of Present Illness Pt is an 87 y/o F with PMH: multiple strokes most recent right MCA stroke in March 2022 with residual left-sided weakness, with worsening left-sided weakness after left upper extremity AV fistula surgery on 8/25 (Dr. [name]). Pt presented to ED d/t L arm pain/throbbing starting the night after the procedure. MRI reviewed by neurology and assessment as follows: "although formal radiology read is concerning for acute on chronic infarction it looks like cortical laminar necrosis and not like a new stroke.  She does have left hand grip weakness and left wrist drop which is new from before, but I do not suspect that this is from a new stroke." Pt now s/p ligation of L UE HD cath site to improve perfusion on 02/26/21.    PT Comments    Pt is making gradual progress towards goals however remains confused (baseline). CO-treat with OT today. Requires +2 for all mobility and needs frequent redirection. Able to sit in recliner and left with OT at end of session.  Recommendations for follow up therapy are one component of a multi-disciplinary discharge planning process, led by the attending physician.  Recommendations may be updated based on patient status, additional functional criteria and insurance authorization.  Follow Up Recommendations  Skilled nursing-short term rehab (<3 hours/day)     Assistance Recommended at Discharge Frequent or constant Supervision/Assistance  Equipment Recommendations   (TBD)    Recommendations for Other Services       Precautions / Restrictions Precautions Precautions: Fall Restrictions Weight Bearing Restrictions: No     Mobility  Bed Mobility Overal bed mobility: Needs Assistance Bed Mobility: Supine to Sit;Sit to Supine     Supine to sit: Mod assist     General bed mobility comments: able to initiate mobility,  however still demonstrates L lateral lean with mod/max assist for support. Able to follow commands    Transfers Overall transfer level: Needs assistance Equipment used: 2 person hand held assist Transfers: Sit to/from Stand Sit to Stand: Mod assist;+2 physical assistance           General transfer comment: +2 for stand with heavy lean towards L side    Ambulation/Gait Ambulation/Gait assistance: Mod assist;+2 physical assistance Gait Distance (Feet): 40 Feet Assistive device: 2 person hand held assist Gait Pattern/deviations: Step-to pattern       General Gait Details: scissoring gait pattern with narrow BOS. +2 HHA for support. Easily loses balance and needs heavy cues for redirection.   Stairs             Wheelchair Mobility    Modified Rankin (Stroke Patients Only)       Balance Overall balance assessment: Needs assistance Sitting-balance support: Feet supported;Single extremity supported Sitting balance-Leahy Scale: Fair     Standing balance support: Bilateral upper extremity supported Standing balance-Leahy Scale: Poor                              Cognition Arousal/Alertness: Awake/alert Behavior During Therapy: Impulsive;Restless Overall Cognitive Status: History of cognitive impairments - at baseline                                 General Comments: very confused at baseline and needs heavy cues for redirection. She thinks she currently at home and wants  to go to the living room        Exercises Other Exercises Other Exercises: attempted seated ther-ex, however easily distracted. LAQ x 10 with cues for attention to L side Other Exercises: hand over hand for practicing with seat belt alarm    General Comments        Pertinent Vitals/Pain Pain Assessment: No/denies pain    Home Living                          Prior Function            PT Goals (current goals can now be found in the care plan  section) Acute Rehab PT Goals Patient Stated Goal: get out of bed PT Goal Formulation: With family Progress towards PT goals: Progressing toward goals    Frequency    Min 2X/week      PT Plan Current plan remains appropriate    Co-evaluation PT/OT/SLP Co-Evaluation/Treatment: Yes Reason for Co-Treatment: Complexity of the patient's impairments (multi-system involvement);Necessary to address cognition/behavior during functional activity;For patient/therapist safety;To address functional/ADL transfers PT goals addressed during session: Mobility/safety with mobility;Balance        AM-PAC PT "6 Clicks" Mobility   Outcome Measure  Help needed turning from your back to your side while in a flat bed without using bedrails?: A Little Help needed moving from lying on your back to sitting on the side of a flat bed without using bedrails?: A Little Help needed moving to and from a bed to a chair (including a wheelchair)?: A Lot Help needed standing up from a chair using your arms (e.g., wheelchair or bedside chair)?: A Lot Help needed to walk in hospital room?: A Lot Help needed climbing 3-5 steps with a railing? : Total 6 Click Score: 13    End of Session Equipment Utilized During Treatment: Gait belt Activity Tolerance: Patient tolerated treatment well Patient left: in chair (left with OT in room) Nurse Communication: Mobility status PT Visit Diagnosis: Unsteadiness on feet (R26.81);Muscle weakness (generalized) (M62.81);History of falling (Z91.81);Difficulty in walking, not elsewhere classified (R26.2);Hemiplegia and hemiparesis Hemiplegia - Right/Left: Left     Time: 6294-7654 PT Time Calculation (min) (ACUTE ONLY): 23 min  Charges:  $Gait Training: 8-22 mins                     Greggory Stallion, [place], DPT [phone]    [name] 05/24/2021, 12:27 PM

## 2021-05-24 NOTE — TOC Progression Note (Signed)
Transition of Care [facility]) - Progression Note    Patient Details  Name: Brianna Dodson MRN: [id number] Date of Birth: 11-Sep-1933  Transition of [facility]) CM/SW Contact  Beverly Sessions, RN Phone Number: 05/24/2021, 10:42 AM  Clinical Narrative:    All facilities in [place] have declined to offer a bed Only bed offer [facility].  Before discussing with family I am awaiting return call from HD coordinator to determine if it would be possible for patients HD center to be switched to [facility]    Expected Discharge Plan: [place] Barriers to Discharge: Continued Medical Work up  Expected Discharge Plan and Services Expected Discharge Plan: [place] Acute Care Choice: Resumption of Svcs/PTA Provider Living arrangements for the past 2 months: Single Family Home                                       Social Determinants of Health (SDOH) Interventions    Readmission Risk Interventions Readmission Risk Prevention Plan 05/18/2021 03/03/2021 03/16/2019  Transportation Screening Complete Complete Complete  PCP or Specialist Appt within 5-7 Days - - Complete  PCP or Specialist Appt within 3-5 Days Complete Complete -  Home Care Screening - - Complete  Medication Review (RN CM) - - Complete  HRI or Home Care Consult Complete Complete -  Social Work Consult for [place] Planning/Counseling Complete Not Complete -  SW consult not completed comments - RNCM assigned to patient -  Palliative Care Screening Not Applicable Not Applicable -  Medication Review (RN Care Manager) Complete Complete -  Some recent data might be hidden

## 2021-05-25 DIAGNOSIS — D696 Thrombocytopenia, unspecified: Secondary | ICD-10-CM | POA: Diagnosis not present

## 2021-05-25 DIAGNOSIS — G309 Alzheimer's disease, unspecified: Secondary | ICD-10-CM | POA: Diagnosis not present

## 2021-05-25 DIAGNOSIS — N186 End stage renal disease: Secondary | ICD-10-CM | POA: Diagnosis not present

## 2021-05-25 DIAGNOSIS — Z992 Dependence on renal dialysis: Secondary | ICD-10-CM | POA: Diagnosis not present

## 2021-05-25 LAB — CBC
HCT: 40.2 % (ref 36.0–46.0)
Hemoglobin: 14 g/dL (ref 12.0–15.0)
MCH: 29.9 pg (ref 26.0–34.0)
MCHC: 34.8 g/dL (ref 30.0–36.0)
MCV: 85.7 fL (ref 80.0–100.0)
Platelets: 100 10*3/uL — ABNORMAL LOW (ref 150–400)
RBC: 4.69 MIL/uL (ref 3.87–5.11)
RDW: 12.7 % (ref 11.5–15.5)
WBC: 4.9 10*3/uL (ref 4.0–10.5)
nRBC: 0 % (ref 0.0–0.2)

## 2021-05-25 LAB — RENAL FUNCTION PANEL
Albumin: 3.4 g/dL — ABNORMAL LOW (ref 3.5–5.0)
Anion gap: 12 (ref 5–15)
BUN: 57 mg/dL — ABNORMAL HIGH (ref 8–23)
CO2: 25 mmol/L (ref 22–32)
Calcium: 8.5 mg/dL — ABNORMAL LOW (ref 8.9–10.3)
Chloride: 96 mmol/L — ABNORMAL LOW (ref 98–111)
Creatinine, Ser: 5.08 mg/dL — ABNORMAL HIGH (ref 0.44–1.00)
GFR, Estimated: 8 mL/min — ABNORMAL LOW (ref >60)
Glucose, Bld: 146 mg/dL — ABNORMAL HIGH (ref 70–99)
Phosphorus: 4.3 mg/dL (ref 2.5–4.6)
Potassium: 3.9 mmol/L (ref 3.5–5.1)
Sodium: 133 mmol/L — ABNORMAL LOW (ref 135–145)

## 2021-05-25 LAB — GLUCOSE, CAPILLARY
Glucose-Capillary: 152 mg/dL — ABNORMAL HIGH (ref 70–99)
Glucose-Capillary: 146 mg/dL — ABNORMAL HIGH (ref 70–99)
Glucose-Capillary: 216 mg/dL — ABNORMAL HIGH (ref 70–99)
Glucose-Capillary: 227 mg/dL — ABNORMAL HIGH (ref 70–99)
Glucose-Capillary: 216 mg/dL — ABNORMAL HIGH (ref 70–99)

## 2021-05-25 LAB — HEPATITIS B SURFACE ANTIBODY, QUANTITATIVE: Hep B S AB Quant (Post): 613.2 m[IU]/mL (ref >9.9)

## 2021-05-25 MED ORDER — HEPARIN SODIUM (PORCINE) 1000 UNIT/ML IJ SOLN
INTRAMUSCULAR | Status: AC
  Filled 2021-05-25: qty 1

## 2021-05-25 NOTE — Progress Notes (Addendum)
Responded to a code alarm from the patient's room.  Patient's family pushed the code button by mistake.  Upon entrance to the patient's room, found the patient slumped in the commode, with food in her mouth.  Patient was minimally responsive.  Rapid response called, charge RN at bedside.  Lifted back in the bed, suctioned food out of mouth.  Patient started responding and following commands, respiration regular and unlabored, lungs are diminished but clear, no distress.  VSS, BG 227.  Dr. Jimmye Norman made aware.  Patient's sister was beside the patient during this event, stating that she and the patient's niece helped the patient up in the commode.  They did not call for help.  She further stated that the patient wants to get back in the bed and asked "Is she sleeping?".  This RN educated the patient's sister and patient's niece about asking the staff for help for patient's needs.  Education given on feeding the patient, that patient tends to pocket food hence making sure that patient's mouth is clear of food before any activity is a must.  Patient's sister and patient's niece both verbalized understanding and agreement.

## 2021-05-25 NOTE — Progress Notes (Signed)
[facility]  ROUNDING NOTE   Subjective:   Patient seen and evaluated during dialysis   HEMODIALYSIS FLOWSHEET:  Blood Flow Rate (mL/min): 400 mL/min Arterial Pressure (mmHg): -200 mmHg Venous Pressure (mmHg): 150 mmHg Transmembrane Pressure (mmHg): 40 mmHg Ultrafiltration Rate (mL/min): 0 mL/min Dialysate Flow Rate (mL/min): 500 ml/min Conductivity: Machine : 13.9 Conductivity: Machine : 13.9 Dialysis Fluid Bolus: Normal Saline Bolus Amount (mL): 250 mL  Currently sitting in chair for treatment No complaints at this time Confusion remains, yelling out during treatment   Objective:  Vital signs in last 24 hours:  Temp:  [97.1 F (36.2 C)-98.7 F (37.1 C)] 98.7 F (37.1 C) (11/23 1230) Pulse Rate:  [45-68] 63 (11/23 1230) Resp:  [8-21] 19 (11/23 1230) BP: (89-155)/(51-76) 131/76 (11/23 1230) SpO2:  [98 %-100 %] 100 % (11/23 1230)  Weight change:  Filed Weights   05/17/21 1251 05/18/21 1545  Weight: 66 kg 60.4 kg    Intake/Output: No intake/output data recorded.   Intake/Output this shift:  Total I/O In: 120 [P.O.:120] Out: 231 [Other:231]  Physical Exam: General: Sitting in chair  Head: Normocephalic, atraumatic. Moist oral mucosal membranes  Lungs:  Clear to auscultation, normal effort  Heart: Regular rate and rhythm  Abdomen:  Soft, nontender, nondistended  Extremities:  No peripheral edema.  Neurologic: Alert to self and place,  Left sided weakness.   Skin: No lesions  Access: Right IJ permcath    Basic Metabolic Panel: Recent Labs  Lab 05/18/21 1545 05/25/21 0328  NA 140 133*  K 4.7 3.9  CL 101 96*  CO2 30 25  GLUCOSE 133* 146*  BUN 35* 57*  CREATININE 3.82* 5.08*  CALCIUM 9.3 8.5*  PHOS 5.4* 4.3     Liver Function Tests: Recent Labs  Lab 05/18/21 1545 05/25/21 0328  ALBUMIN 3.4* 3.4*    No results for input(s): LIPASE, AMYLASE in the last 168 hours. Recent Labs  Lab 05/20/21 1128  AMMONIA 13      CBC: Recent Labs  Lab 05/18/21 1545 05/25/21 0328  WBC 5.7 4.9  HGB 13.6 14.0  HCT 40.2 40.2  MCV 86.3 85.7  PLT 99* 100*     Cardiac Enzymes: No results for input(s): CKTOTAL, CKMB, CKMBINDEX, TROPONINI in the last 168 hours.  BNP: Invalid input(s): POCBNP  CBG: Recent Labs  Lab 05/24/21 1128 05/24/21 1600 05/24/21 2120 05/25/21 0757 05/25/21 1249  GLUCAP 161* 178* 218* 152* 146*     Microbiology: Results for orders placed or performed during the hospital encounter of 05/16/21  Resp Panel by RT-PCR (Flu A&B, Covid) Nasopharyngeal Swab     Status: None   Collection Time: 05/16/21 10:07 PM   Specimen: Nasopharyngeal Swab; Nasopharyngeal(NP) swabs in vial transport medium  Result Value Ref Range Status   SARS Coronavirus 2 by RT PCR NEGATIVE NEGATIVE Final    Comment: (NOTE) SARS-CoV-2 target nucleic acids are NOT DETECTED.  The SARS-CoV-2 RNA is generally detectable in upper respiratory specimens during the acute phase of infection. The lowest concentration of SARS-CoV-2 viral copies this assay can detect is 138 copies/mL. A negative result does not preclude SARS-Cov-2 infection and should not be used as the sole basis for treatment or other patient management decisions. A negative result may occur with  improper specimen collection/handling, submission of specimen other than nasopharyngeal swab, presence of viral mutation(s) within the areas targeted by this assay, and inadequate number of viral copies(<138 copies/mL). A negative result must be combined with clinical observations, patient history, and  epidemiological information. The expected result is Negative.  Fact Sheet for Patients:  EntrepreneurPulse.com.au  Fact Sheet for Healthcare Providers:  IncredibleEmployment.be  This test is no t yet approved or cleared by the [place] FDA and  has been authorized for detection and/or diagnosis of SARS-CoV-2  by FDA under an Emergency Use Authorization (EUA). This EUA will remain  in effect (meaning this test can be used) for the duration of the COVID-19 declaration under Section 564(b)(1) of the Act, 21 U.S.C.section 360bbb-3(b)(1), unless the authorization is terminated  or revoked sooner.       Influenza A by PCR NEGATIVE NEGATIVE Final   Influenza B by PCR NEGATIVE NEGATIVE Final    Comment: (NOTE) The Xpert Xpress SARS-CoV-2/FLU/RSV plus assay is intended as an aid in the diagnosis of influenza from Nasopharyngeal swab specimens and should not be used as a sole basis for treatment. Nasal washings and aspirates are unacceptable for Xpert Xpress SARS-CoV-2/FLU/RSV testing.  Fact Sheet for Patients: EntrepreneurPulse.com.au  Fact Sheet for Healthcare Providers: IncredibleEmployment.be  This test is not yet approved or cleared by the [place] FDA and has been authorized for detection and/or diagnosis of SARS-CoV-2 by FDA under an Emergency Use Authorization (EUA). This EUA will remain in effect (meaning this test can be used) for the duration of the COVID-19 declaration under Section 564(b)(1) of the Act, 21 U.S.C. section 360bbb-3(b)(1), unless the authorization is terminated or revoked.  Performed at [facility], [address], [place], [place] [address]   Urine Culture     Status: Abnormal   Collection Time: 05/16/21 10:07 PM   Specimen: In/Out Cath Urine  Result Value Ref Range Status   Specimen Description   Final    IN/OUT CATH URINE Performed at [facility], [address] [place], [place] [address]    Special Requests   Final    NONE Performed at [facility], [address] [place], [place] [address]    Culture (A)  Final    >=100,000 COLONIES/mL GARDNERELLA VAGINALIS Standardized susceptibility testing for this organism is not available. 80,000 COLONIES/mL DIPHTHEROIDS(CORYNEBACTERIUM  SPECIES)    Report Status 05/19/2021 FINAL  Final    Coagulation Studies: No results for input(s): LABPROT, INR in the last 72 hours.  Urinalysis: No results for input(s): COLORURINE, LABSPEC, PHURINE, GLUCOSEU, HGBUR, BILIRUBINUR, KETONESUR, PROTEINUR, UROBILINOGEN, NITRITE, LEUKOCYTESUR in the last 72 hours.  Invalid input(s): APPERANCEUR     Imaging: No results found.   Medications:      apixaban  2.5 mg Oral BID   ascorbic acid  500 mg Oral Daily   atorvastatin  80 mg Oral Daily   carvedilol  6.25 mg Oral BID WC   Chlorhexidine Gluconate Cloth  6 each Topical Daily   Chlorhexidine Gluconate Cloth  6 each Topical Q0600   ezetimibe  10 mg Oral Daily   ferrous gluconate  324 mg Oral Q1500   heparin sodium (porcine)       levETIRAcetam  250 mg Oral Once per day on Mon Wed Fri   levETIRAcetam  500 mg Oral BID   losartan  100 mg Oral QHS   mouth rinse  15 mL Mouth Rinse BID   mirtazapine  7.5 mg Oral QHS   multivitamin  1 tablet Oral QHS   risperiDONE  0.5 mg Oral QHS   acetaminophen **OR** acetaminophen, haloperidol lactate, labetalol, ondansetron **OR** ondansetron (ZOFRAN) IV, risperiDONE, senna-docusate  Assessment/ Plan:  Brianna Dodson is a 87 y.o. black  female with end stage renal disease on hemodialysis, hypertension, CVA, vascular dementia, hyperlipidemia, diabetes mellitus type II, atrial fibrillation who is admitted to [facility] on 05/16/2021 for Confusion [R41.0] Agitation [R45.1]   [facility] MWF [facility] [address] RIJ permcath 65.5kg   End Stage Renal Disease:  -Receiving dialysis seated in chair today, tolerating well.  UF goal 500 mL.  Next treatment scheduled for Friday.   Altered mental status:  - Levetiracetam at neurology recommendations of 500mg  twice a day with additional 250 mg dose after dialysis.   Hypertension: with hypertension urgency on admission. Home regimen includes losartan, amlodipine, carvedilol, hydralazine.   - IV labatelol PRN  -  started on carvedilol and losartan.  Currently amlodipine.  -Losartan to be administered at night.   -BP well managed during dialysis, 1 hypotensive event reported.  Patient remained responsive during episode   Anemia of chronic kidney disease: Normocytic. Mircera as outpatient. Outpatient iron levels at goal on 9/7.  Current hemoglobin 14, at goal.   Secondary Hyperparathyroidism: PTH, calcium and phosphorus at goal on 11/2. Not currently on binders.  Phosphorus at goal, and calcium slightly decreased.   LOS: 8   11/23/20222:23 PM

## 2021-05-25 NOTE — Progress Notes (Addendum)
   05/25/21 1800  Clinical Encounter Type  Visited With Family;Patient;Health care provider  Visit Type Code  Referral From Nurse  Spiritual Encounters  Spiritual Needs Prayer;Emotional  Stress Factors  Patient Stress Factors Health changes  Family Stress Factors Health changes;Lack of knowledge   This chaplain responded to a Rapid Response page for the patient. Upon my arrival, the patient was receiving care from the medical team. Her sister, Mrs. Richardson Landry was standing in the hallway talking with staff. After the conclusion of their conversation, this Probation officer talked with the patient's sister and provided active, empathetic, and reflective listening. Mrs. Richardson Landry shared concern about the quick change in her sister's status.  This Probation officer asked to come bedside to offer a prayer for the patient and her family. Prayer offered for the patient's health and other shared concerns. Chaplains remain available for support.  Gennaro Africa, Chaplain

## 2021-05-25 NOTE — TOC Progression Note (Signed)
Transition of Care [facility]) - Progression Note    Patient Details  Name: Brianna Dodson MRN: [id number] Date of Birth: [birth year]  Transition of Care [facility] CM/SW Contact  Beverly Sessions, RN Phone Number: 05/25/2021, 10:24 AM  Clinical Narrative:    Patient to sitting for HD today   Expected Discharge Plan: [place] Barriers to Discharge: Continued Medical Work up  Expected Discharge Plan and Services Expected Discharge Plan: [place] Acute Care Choice: Resumption of Svcs/PTA Provider Living arrangements for the past 2 months: Single Family Home                                       Social Determinants of Health (SDOH) Interventions    Readmission Risk Interventions Readmission Risk Prevention Plan 05/18/2021 03/03/2021 03/16/2019  Transportation Screening Complete Complete Complete  PCP or Specialist Appt within 5-7 Days - - Complete  PCP or Specialist Appt within 3-5 Days Complete Complete -  Home Care Screening - - Complete  Medication Review (RN CM) - - Complete  HRI or Home Care Consult Complete Complete -  Social Work Consult for [place] Planning/Counseling Complete Not Complete -  SW consult not completed comments - RNCM assigned to patient -  Palliative Care Screening Not Applicable Not Applicable -  Medication Review (RN Care Manager) Complete Complete -  Some recent data might be hidden

## 2021-05-25 NOTE — TOC Progression Note (Signed)
Transition of Care [facility]) - Progression Note    Patient Details  Name: Leanah Kolander MRN: [id number] Date of Birth: Oct 26, 1933  Transition of Care [facility]) CM/SW Contact  Beverly Sessions, RN Phone Number: 05/25/2021, 3:28 PM  Clinical Narrative:     Patient can start at [facility] on Monday This means if she tolerates HD on Friday she could DC Friday to [facility] and start outpatient on Monday. 4 way call completed with HD coordinator, sister and daughter. Sister to go to the new HD center on Friday to complete paper work in order for patient to start on Monday. If she does not complete the paper work they will not dialyze her [place] with [place], [facility] supervisor, and MD updated   Expected Discharge Plan: [place] Barriers to Discharge: Continued Medical Work up  Expected Discharge Plan and Services Expected Discharge Plan: [place] Acute Care Choice: Resumption of Svcs/PTA Provider Living arrangements for the past 2 months: Single Family Home                                       Social Determinants of Health (SDOH) Interventions    Readmission Risk Interventions Readmission Risk Prevention Plan 05/18/2021 03/03/2021 03/16/2019  Transportation Screening Complete Complete Complete  PCP or Specialist Appt within 5-7 Days - - Complete  PCP or Specialist Appt within 3-5 Days Complete Complete -  Home Care Screening - - Complete  Medication Review (RN CM) - - Complete  HRI or Home Care Consult Complete Complete -  Social Work Consult for [place] Planning/Counseling Complete Not Complete -  SW consult not completed comments - RNCM assigned to patient -  Palliative Care Screening Not Applicable Not Applicable -  Medication Review (RN Care Manager) Complete Complete -  Some recent data might be hidden

## 2021-05-25 NOTE — Progress Notes (Signed)
PROGRESS NOTE    Brianna Dodson  NLZ:[id number] DOB: 1933/12/22 DOA: 05/16/2021 PCP: [name], MD    Assessment & Plan:   Principal Problem:   Agitation Active Problems:   Essential hypertension   Type II diabetes mellitus with renal manifestations ([place])   Atrial fibrillation (HCC)   Mixed Alzheimer's and vascular dementia ([place])   End stage renal disease on dialysis ([place])   Recurrent strokes (HCC)   Thrombocytopenia ([place])   Chronic anticoagulation   Pressure injury of skin   Hypertensive urgency   Post-ictal state ([place])  Acute encephalopathy:  suspected metabolic with malignant hypertension, Alzheimer's & vascular dementia. CT head shows no acute intracranial abnormality but chronic right MCA infarct & chronic atrophy. EEG suggestive of cortical dysfunction arising from left frontotemporal region, nonspecific etiology but could be secondary to underlying structural abnormality, post-ictal state.  No seizures or epileptiform discharges were seen throughout the recording.  Continue on keppra as per neuro. Continue on remeron & risperdal. Haldol prn. Neuro and psych recs are apprec   ESRD: on HD MWF. HD as per nephro. Nephro recs apprec   Thrombocytopenia: etiology unclear, labile. Will continue to monitor    DM2: well controlled, HbA1c 5.5. Diet controlled   Hx of CVA:  with vascular dementia. MRI of the brain shows no acute CVA, chronic CVAs   HLD: continue on statin, zetia   Malignant HTN: resolved but still w/ HTN. Continue on coreg, losartan   DVT prophylaxis: eliquis  Code Status: full  Family Communication: called pt's sister, [name], but no answer and unable to leave a voicemail  Disposition Plan: likely d/c to SNF   Level of care: Med-Surg  Status is: Inpatient  Remains inpatient appropriate because: waiting SNF placement       Consultants:    Procedures:   Antimicrobials:   Subjective: Pt c/o buttocks pain   Objective: Vitals:    05/24/21 0731 05/24/21 1456 05/24/21 1943 05/25/21 0505  BP: (!) 127/58 (!) 122/58 (!) 131/53 (!) 155/55  Pulse: 62 66 63 67  Resp:    16  Temp: 97.6 F (36.4 C) 97.6 F (36.4 C) 97.7 F (36.5 C) 98.1 F (36.7 C)  TempSrc:   Oral Oral  SpO2: 99% 98% 100% 98%  Weight:      Height:       No intake or output data in the 24 hours ending 05/25/21 0757 Filed Weights   05/17/21 1251 05/18/21 1545  Weight: 66 kg 60.4 kg    Examination:  General exam: Appears calm but uncomfortable  Respiratory system: Clear to auscultation. Respiratory effort normal. Cardiovascular system: S1 & S2 +. No rubs, gallops or clicks.  Gastrointestinal system: Abdomen is nondistended, soft and nontender. Hypoactive bowel sounds heard. Central nervous system: Alert and awake. Moves all extremities Psychiatry: Judgement and insight appear abnormal. Flat mood and affect     Data Reviewed: I have personally reviewed following labs and imaging studies  CBC: Recent Labs  Lab 05/18/21 1545 05/25/21 0328  WBC 5.7 4.9  HGB 13.6 14.0  HCT 40.2 40.2  MCV 86.3 85.7  PLT 99* 902*   Basic Metabolic Panel: Recent Labs  Lab 05/18/21 1545 05/25/21 0328  NA 140 133*  K 4.7 3.9  CL 101 96*  CO2 30 25  GLUCOSE 133* 146*  BUN 35* 57*  CREATININE 3.82* 5.08*  CALCIUM 9.3 8.5*  PHOS 5.4* 4.3   GFR: Estimated Creatinine Clearance: 7.3 mL/min (A) (by C-G formula based on SCr  of 5.08 mg/dL (H)). Liver Function Tests: Recent Labs  Lab 05/18/21 1545 05/25/21 0328  ALBUMIN 3.4* 3.4*   No results for input(s): LIPASE, AMYLASE in the last 168 hours. Recent Labs  Lab 05/20/21 1128  AMMONIA 13   Coagulation Profile: No results for input(s): INR, PROTIME in the last 168 hours. Cardiac Enzymes: No results for input(s): CKTOTAL, CKMB, CKMBINDEX, TROPONINI in the last 168 hours. BNP (last 3 results) No results for input(s): PROBNP in the last 8760 hours. HbA1C: No results for input(s): HGBA1C in the last  72 hours. CBG: Recent Labs  Lab 05/23/21 2049 05/24/21 0731 05/24/21 1128 05/24/21 1600 05/24/21 2120  GLUCAP 189* 142* 161* 178* 218*   Lipid Profile: No results for input(s): CHOL, HDL, LDLCALC, TRIG, CHOLHDL, LDLDIRECT in the last 72 hours. Thyroid Function Tests: No results for input(s): TSH, T4TOTAL, FREET4, T3FREE, THYROIDAB in the last 72 hours. Anemia Panel: No results for input(s): VITAMINB12, FOLATE, FERRITIN, TIBC, IRON, RETICCTPCT in the last 72 hours. Sepsis Labs: No results for input(s): PROCALCITON, LATICACIDVEN in the last 168 hours.  Recent Results (from the past 240 hour(s))  Resp Panel by RT-PCR (Flu A&B, Covid) Nasopharyngeal Swab     Status: None   Collection Time: 05/16/21 10:07 PM   Specimen: Nasopharyngeal Swab; Nasopharyngeal(NP) swabs in vial transport medium  Result Value Ref Range Status   SARS Coronavirus 2 by RT PCR NEGATIVE NEGATIVE Final    Comment: (NOTE) SARS-CoV-2 target nucleic acids are NOT DETECTED.  The SARS-CoV-2 RNA is generally detectable in upper respiratory specimens during the acute phase of infection. The lowest concentration of SARS-CoV-2 viral copies this assay can detect is 138 copies/mL. A negative result does not preclude SARS-Cov-2 infection and should not be used as the sole basis for treatment or other patient management decisions. A negative result may occur with  improper specimen collection/handling, submission of specimen other than nasopharyngeal swab, presence of viral mutation(s) within the areas targeted by this assay, and inadequate number of viral copies(<138 copies/mL). A negative result must be combined with clinical observations, patient history, and epidemiological information. The expected result is Negative.  Fact Sheet for Patients:  EntrepreneurPulse.com.au  Fact Sheet for Healthcare Providers:  IncredibleEmployment.be  This test is no t yet approved or cleared by  the [place] FDA and  has been authorized for detection and/or diagnosis of SARS-CoV-2 by FDA under an Emergency Use Authorization (EUA). This EUA will remain  in effect (meaning this test can be used) for the duration of the COVID-19 declaration under Section 564(b)(1) of the Act, 21 U.S.C.section 360bbb-3(b)(1), unless the authorization is terminated  or revoked sooner.       Influenza A by PCR NEGATIVE NEGATIVE Final   Influenza B by PCR NEGATIVE NEGATIVE Final    Comment: (NOTE) The Xpert Xpress SARS-CoV-2/FLU/RSV plus assay is intended as an aid in the diagnosis of influenza from Nasopharyngeal swab specimens and should not be used as a sole basis for treatment. Nasal washings and aspirates are unacceptable for Xpert Xpress SARS-CoV-2/FLU/RSV testing.  Fact Sheet for Patients: EntrepreneurPulse.com.au  Fact Sheet for Healthcare Providers: IncredibleEmployment.be  This test is not yet approved or cleared by the [place] FDA and has been authorized for detection and/or diagnosis of SARS-CoV-2 by FDA under an Emergency Use Authorization (EUA). This EUA will remain in effect (meaning this test can be used) for the duration of the COVID-19 declaration under Section 564(b)(1) of the Act, 21 U.S.C. section 360bbb-3(b)(1), unless the authorization is  terminated or revoked.  Performed at [facility], [address] [place], [place] [address]   Urine Culture     Status: Abnormal   Collection Time: 05/16/21 10:07 PM   Specimen: In/Out Cath Urine  Result Value Ref Range Status   Specimen Description   Final    IN/OUT CATH URINE Performed at [facility], [address], [place], [place] [address]    Special Requests   Final    NONE Performed at [facility], [address] [place], [place] [address]    Culture (A)  Final    >=100,000 COLONIES/mL GARDNERELLA VAGINALIS Standardized susceptibility  testing for this organism is not available. 80,000 COLONIES/mL DIPHTHEROIDS(CORYNEBACTERIUM SPECIES)    Report Status 05/19/2021 FINAL  Final         Radiology Studies: No results found.      Scheduled Meds:  apixaban  2.5 mg Oral BID   ascorbic acid  500 mg Oral Daily   atorvastatin  80 mg Oral Daily   carvedilol  6.25 mg Oral BID WC   Chlorhexidine Gluconate Cloth  6 each Topical Daily   Chlorhexidine Gluconate Cloth  6 each Topical Q0600   ezetimibe  10 mg Oral Daily   ferrous gluconate  324 mg Oral Q1500   levETIRAcetam  250 mg Oral Once per day on Mon Wed Fri   levETIRAcetam  500 mg Oral BID   losartan  100 mg Oral QHS   mouth rinse  15 mL Mouth Rinse BID   mirtazapine  7.5 mg Oral QHS   multivitamin  1 tablet Oral QHS   risperiDONE  0.5 mg Oral QHS   Continuous Infusions:   LOS: 8 days    Time spent: 25 mins     Wyvonnia Dusky, MD [facility] Pager [phone]  If 7PM-7AM, please contact night-coverage 05/25/2021, 7:57 AM

## 2021-05-25 NOTE — Progress Notes (Signed)
Rapid Response Event Note   Reason for Call : syncopal episode   Initial Focused Assessment: On my arrival to room, pt is in bed and alert. VSS. Pt's primary RN states that patient had been assisted to bathroom by family member and had a brief moment of unresponsiveness. Once pt was assisted back to bed pt became alert and upon suctioning mouth, food particles were removed.   Interventions: Pt was assessed and is now alert and back to baseline. VSS.   Plan of Care: Pt will remain on 2C at this time. Will follow up with night shift nurse later on.    Event Summary:   MD Notified: Dr. Jimmye Norman Call Time: 1806 Arrival Time: 7681 End Time: 1815  Trellis Paganini, RN

## 2021-05-25 NOTE — Progress Notes (Signed)
Patient dialyzed for 3hours, sitting in dialysis chair. She tolerated sitting in chair well. UF goal was decreased and turned off during treatment due to blood pressure dropping in 80s, nephrology team was made aware.CVC dressing changed. Net UF=218ml.

## 2021-05-25 NOTE — Progress Notes (Signed)
Waiting on MD acceptance at [place] Control and instrumentation engineer) stated that she felt confident that the MD will approve today. Patient will be MWF 2nd shift. Penny asked for [facility] to call once patient is admitted to schedule a start time for Monday 11/28. If patient cannot stand and pivot, please inform [facility] to place hoyer pad under patient for safe transfer.

## 2021-05-25 NOTE — Progress Notes (Signed)
PT Cancellation Note  Patient Details Name: Brianna Dodson MRN: [id number] DOB: 02-21-1934   Cancelled Treatment:    Reason Eval/Treat Not Completed: Patient at procedure or test/unavailable. Patient not available, at HD. Will re-attempt at later time/date.    Terris Bodin 05/25/2021, 9:54 AM

## 2021-05-25 NOTE — Care Management Important Message (Signed)
Important Message  Patient Details  Name: Brianna Dodson MRN: [id number] Date of Birth: 1933/12/02   Medicare Important Message Given:  Yes  Patient out of room upon time of visit.  Copy of Medicare IM left on windowsill for reference.     Dannette Barbara 05/25/2021, 10:49 AM

## 2021-05-26 DIAGNOSIS — G309 Alzheimer's disease, unspecified: Secondary | ICD-10-CM | POA: Diagnosis not present

## 2021-05-26 DIAGNOSIS — N186 End stage renal disease: Secondary | ICD-10-CM | POA: Diagnosis not present

## 2021-05-26 DIAGNOSIS — Z992 Dependence on renal dialysis: Secondary | ICD-10-CM | POA: Diagnosis not present

## 2021-05-26 DIAGNOSIS — D696 Thrombocytopenia, unspecified: Secondary | ICD-10-CM | POA: Diagnosis not present

## 2021-05-26 LAB — CBC
HCT: 35.9 % — ABNORMAL LOW (ref 36.0–46.0)
Hemoglobin: 12.6 g/dL (ref 12.0–15.0)
MCH: 29.9 pg (ref 26.0–34.0)
MCHC: 35.1 g/dL (ref 30.0–36.0)
MCV: 85.3 fL (ref 80.0–100.0)
Platelets: 117 10*3/uL — ABNORMAL LOW (ref 150–400)
RBC: 4.21 MIL/uL (ref 3.87–5.11)
RDW: 12.8 % (ref 11.5–15.5)
WBC: 4.7 10*3/uL (ref 4.0–10.5)
nRBC: 0 % (ref 0.0–0.2)

## 2021-05-26 LAB — BASIC METABOLIC PANEL
Anion gap: 9 (ref 5–15)
BUN: 43 mg/dL — ABNORMAL HIGH (ref 8–23)
CO2: 26 mmol/L (ref 22–32)
Calcium: 8.2 mg/dL — ABNORMAL LOW (ref 8.9–10.3)
Chloride: 98 mmol/L (ref 98–111)
Creatinine, Ser: 4.12 mg/dL — ABNORMAL HIGH (ref 0.44–1.00)
GFR, Estimated: 10 mL/min — ABNORMAL LOW (ref >60)
Glucose, Bld: 146 mg/dL — ABNORMAL HIGH (ref 70–99)
Potassium: 3.8 mmol/L (ref 3.5–5.1)
Sodium: 133 mmol/L — ABNORMAL LOW (ref 135–145)

## 2021-05-26 LAB — GLUCOSE, CAPILLARY
Glucose-Capillary: 145 mg/dL — ABNORMAL HIGH (ref 70–99)
Glucose-Capillary: 190 mg/dL — ABNORMAL HIGH (ref 70–99)
Glucose-Capillary: 310 mg/dL — ABNORMAL HIGH (ref 70–99)
Glucose-Capillary: 167 mg/dL — ABNORMAL HIGH (ref 70–99)

## 2021-05-26 MED ORDER — COLLAGENASE 250 UNIT/GM EX OINT
TOPICAL_OINTMENT | Freq: Every day | CUTANEOUS | Status: DC
  Filled 2021-05-26: qty 30

## 2021-05-26 MED ORDER — INSULIN ASPART 100 UNIT/ML IJ SOLN
0.0000 [IU] | Freq: Three times a day (TID) | INTRAMUSCULAR | Status: DC
  Administered 2021-05-26: 7 [IU] via SUBCUTANEOUS
  Administered 2021-05-27 (×2): 2 [IU] via SUBCUTANEOUS
  Administered 2021-05-28: 1 [IU] via SUBCUTANEOUS
  Administered 2021-05-28 – 2021-05-29 (×2): 2 [IU] via SUBCUTANEOUS
  Administered 2021-05-30 – 2021-05-31 (×2): 1 [IU] via SUBCUTANEOUS
  Administered 2021-05-31: 2 [IU] via SUBCUTANEOUS
  Administered 2021-05-31: 3 [IU] via SUBCUTANEOUS
  Administered 2021-06-01: 2 [IU] via SUBCUTANEOUS
  Administered 2021-06-01 – 2021-06-02 (×3): 3 [IU] via SUBCUTANEOUS
  Administered 2021-06-02: 1 [IU] via SUBCUTANEOUS
  Administered 2021-06-03: 2 [IU] via SUBCUTANEOUS
  Filled 2021-05-26 (×17): qty 1

## 2021-05-26 NOTE — Progress Notes (Signed)
[facility]  ROUNDING NOTE   Subjective:   Patient seen in the room. Denies any acute complaints Reports left hip pain today Ate some of her breakfast  Objective:  Vital signs in last 24 hours:  Temp:  [97.6 F (36.4 C)-98.7 F (37.1 C)] 98.6 F (37 C) (11/24 0719) Pulse Rate:  [45-68] 61 (11/24 0719) Resp:  [10-19] 18 (11/24 0719) BP: (89-148)/(51-76) 116/57 (11/24 0719) SpO2:  [98 %-100 %] 99 % (11/24 0719)  Weight change:  Filed Weights   05/17/21 1251 05/18/21 1545  Weight: 66 kg 60.4 kg    Intake/Output: I/O last 3 completed shifts: In: 340 [P.O.:340] Out: 231 [Other:231]   Intake/Output this shift:  Total I/O In: 240 [P.O.:240] Out: -   Physical Exam: General: Laying in the bed, no acute distress  Head: Normocephalic, atraumatic. Moist oral mucosal membranes  Lungs:  Clear to auscultation, normal effort  Heart: No rub or gallop  Abdomen:  Soft, nontender, nondistended  Extremities:  No peripheral edema.  Neurologic: Alert to self and place,  Left sided weakness.   Skin: No lesions  Access: Right IJ permcath    Basic Metabolic Panel: Recent Labs  Lab 05/25/21 0328 05/26/21 0405  NA 133* 133*  K 3.9 3.8  CL 96* 98  CO2 25 26  GLUCOSE 146* 146*  BUN 57* 43*  CREATININE 5.08* 4.12*  CALCIUM 8.5* 8.2*  PHOS 4.3  --      Liver Function Tests: Recent Labs  Lab 05/25/21 0328  ALBUMIN 3.4*    No results for input(s): LIPASE, AMYLASE in the last 168 hours. Recent Labs  Lab 05/20/21 1128  AMMONIA 13     CBC: Recent Labs  Lab 05/25/21 0328 05/26/21 0405  WBC 4.9 4.7  HGB 14.0 12.6  HCT 40.2 35.9*  MCV 85.7 85.3  PLT 100* 117*     Cardiac Enzymes: No results for input(s): CKTOTAL, CKMB, CKMBINDEX, TROPONINI in the last 168 hours.  BNP: Invalid input(s): POCBNP  CBG: Recent Labs  Lab 05/25/21 1249 05/25/21 1639 05/25/21 1808 05/25/21 2156 05/26/21 0719  GLUCAP 146* 216* 227* 216* 145*      Microbiology: Results for orders placed or performed during the hospital encounter of 05/16/21  Resp Panel by RT-PCR (Flu A&B, Covid) Nasopharyngeal Swab     Status: None   Collection Time: 05/16/21 10:07 PM   Specimen: Nasopharyngeal Swab; Nasopharyngeal(NP) swabs in vial transport medium  Result Value Ref Range Status   SARS Coronavirus 2 by RT PCR NEGATIVE NEGATIVE Final    Comment: (NOTE) SARS-CoV-2 target nucleic acids are NOT DETECTED.  The SARS-CoV-2 RNA is generally detectable in upper respiratory specimens during the acute phase of infection. The lowest concentration of SARS-CoV-2 viral copies this assay can detect is 138 copies/mL. A negative result does not preclude SARS-Cov-2 infection and should not be used as the sole basis for treatment or other patient management decisions. A negative result may occur with  improper specimen collection/handling, submission of specimen other than nasopharyngeal swab, presence of viral mutation(s) within the areas targeted by this assay, and inadequate number of viral copies(<138 copies/mL). A negative result must be combined with clinical observations, patient history, and epidemiological information. The expected result is Negative.  Fact Sheet for Patients:  EntrepreneurPulse.com.au  Fact Sheet for Healthcare Providers:  IncredibleEmployment.be  This test is no t yet approved or cleared by the [place] FDA and  has been authorized for detection and/or diagnosis of SARS-CoV-2 by FDA under  an Emergency Use Authorization (EUA). This EUA will remain  in effect (meaning this test can be used) for the duration of the COVID-19 declaration under Section 564(b)(1) of the Act, 21 U.S.C.section 360bbb-3(b)(1), unless the authorization is terminated  or revoked sooner.       Influenza A by PCR NEGATIVE NEGATIVE Final   Influenza B by PCR NEGATIVE NEGATIVE Final    Comment: (NOTE) The Xpert  Xpress SARS-CoV-2/FLU/RSV plus assay is intended as an aid in the diagnosis of influenza from Nasopharyngeal swab specimens and should not be used as a sole basis for treatment. Nasal washings and aspirates are unacceptable for Xpert Xpress SARS-CoV-2/FLU/RSV testing.  Fact Sheet for Patients: EntrepreneurPulse.com.au  Fact Sheet for Healthcare Providers: IncredibleEmployment.be  This test is not yet approved or cleared by the [place] FDA and has been authorized for detection and/or diagnosis of SARS-CoV-2 by FDA under an Emergency Use Authorization (EUA). This EUA will remain in effect (meaning this test can be used) for the duration of the COVID-19 declaration under Section 564(b)(1) of the Act, 21 U.S.C. section 360bbb-3(b)(1), unless the authorization is terminated or revoked.  Performed at [facility], [address], [place], [place] [address]   Urine Culture     Status: Abnormal   Collection Time: 05/16/21 10:07 PM   Specimen: In/Out Cath Urine  Result Value Ref Range Status   Specimen Description   Final    IN/OUT CATH URINE Performed at [facility], [address] [place], [place] [address]    Special Requests   Final    NONE Performed at [facility] [address] [place], [place] [address]    Culture (A)  Final    >=100,000 COLONIES/mL GARDNERELLA VAGINALIS Standardized susceptibility testing for this organism is not available. 80,000 COLONIES/mL DIPHTHEROIDS(CORYNEBACTERIUM SPECIES)    Report Status 05/19/2021 FINAL  Final    Coagulation Studies: No results for input(s): LABPROT, INR in the last 72 hours.  Urinalysis: No results for input(s): COLORURINE, LABSPEC, PHURINE, GLUCOSEU, HGBUR, BILIRUBINUR, KETONESUR, PROTEINUR, UROBILINOGEN, NITRITE, LEUKOCYTESUR in the last 72 hours.  Invalid input(s): APPERANCEUR     Imaging: No results found.   Medications:      apixaban   2.5 mg Oral BID   ascorbic acid  500 mg Oral Daily   atorvastatin  80 mg Oral Daily   carvedilol  6.25 mg Oral BID WC   Chlorhexidine Gluconate Cloth  6 each Topical Daily   Chlorhexidine Gluconate Cloth  6 each Topical Q0600   ezetimibe  10 mg Oral Daily   ferrous gluconate  324 mg Oral Q1500   levETIRAcetam  250 mg Oral Once per day on Mon Wed Fri   levETIRAcetam  500 mg Oral BID   losartan  100 mg Oral QHS   mouth rinse  15 mL Mouth Rinse BID   mirtazapine  7.5 mg Oral QHS   multivitamin  1 tablet Oral QHS   risperiDONE  0.5 mg Oral QHS   acetaminophen **OR** acetaminophen, haloperidol lactate, labetalol, ondansetron **OR** ondansetron (ZOFRAN) IV, risperiDONE, senna-docusate  Assessment/ Plan:  Brianna Dodson is a 87 y.o. black female with end stage renal disease on hemodialysis, hypertension, CVA, vascular dementia, hyperlipidemia, diabetes mellitus type II, atrial fibrillation who is admitted to [facility] on 05/16/2021 for Confusion [R41.0] Agitation [R45.1]   [facility] MWF [facility] [address] RIJ permcath 65.5kg   End Stage Renal Disease:  - Next treatment scheduled for Friday.   Altered mental status:  -  Levetiracetam at neurology recommendations of 500mg  twice a day with additional 250 mg dose after dialysis. Patient with history of chronic right MCA stroke and chronic atrophy.  Abnormal EEG.   Hypertension: with hypertension urgency on admission. Home regimen includes losartan, amlodipine, carvedilol, hydralazine.   - IV labatelol PRN  -Managed with losartan, carvedilol   Anemia of chronic kidney disease:  Lab Results  Component Value Date   HGB 12.6 05/26/2021  No indication for Epogen   Secondary Hyperparathyroidism:  Lab Results  Component Value Date   PTH 83 (H) 01/06/2016   CALCIUM 8.2 (L) 05/26/2021   CAION 1.04 (L) 02/24/2021   PHOS 4.3 05/25/2021      LOS: 9 Ayden Hardwick 11/24/202210:31 AM

## 2021-05-26 NOTE — Progress Notes (Signed)
   05/26/21 0800  Clinical Encounter Type  Visited With Patient not available  Visit Type Follow-up  Referral From Chaplain;Family   This chaplain attempted to follow up with the patient and her loved ones in the wake of last evening's Rapid Response event. The patient was receiving assistance with personal care and unavailable. Will request follow-up from on-coming chaplain.  Gennaro Africa, Chaplain

## 2021-05-26 NOTE — Progress Notes (Signed)
PROGRESS NOTE    Brianna Dodson  JKK:[id number] DOB: June 10, 1934 DOA: 05/16/2021 PCP: Brianna Haven, MD    Assessment & Plan:   Principal Problem:   Agitation Active Problems:   Essential hypertension   Type II diabetes mellitus with renal manifestations ([place])   Atrial fibrillation (HCC)   Mixed Alzheimer's and vascular dementia ([place])   End stage renal disease on dialysis ([place])   Recurrent strokes (HCC)   Thrombocytopenia ([place])   Chronic anticoagulation   Pressure injury of skin   Hypertensive urgency   Post-ictal state ([place])  Acute encephalopathy:  suspected metabolic with malignant hypertension, Alzheimer's & vascular dementia. CT head shows no acute intracranial abnormality but chronic right MCA infarct & chronic atrophy. EEG suggestive of cortical dysfunction arising from left frontotemporal region, nonspecific etiology but could be secondary to underlying structural abnormality, post-ictal state.  No seizures or epileptiform discharges were seen throughout the recording. Continue on keppra, remeron, & risperdal. Haldol prn. Neuro & psych recs are apprec   ESRD: on HD MWF. HD as per nephro. Nephro recs apprec   Thrombocytopenia: etiology unclear. Labile. Will continue to monitor    DM2: HbA1c 5.5, well controlled. Diet controlled    Hx of CVA:  with vascular dementia. MRI of the brain shows no acute CVA, chronic CVAs   HLD: continue on statin, zetia    Malignant HTN: resolved but still w/ HTN. Continue on losartan, coreg    DVT prophylaxis: eliquis  Code Status: full  Family Communication: called pt's sister, Brianna Dodson, and answered her questions  Disposition Plan: d/c to SNF tomorrow likely    Level of care: Med-Surg  Status is: Inpatient  Remains inpatient appropriate because: likely d/c to SNF tomorrow but pt's sister, Brianna Dodson, that she needs to fill out this paperwork for the pt to receive HD at another center.    Consultants:  Nephro   Procedures:    Antimicrobials:   Subjective: Pt c/o fatigue   Objective: Vitals:   05/25/21 1810 05/25/21 2004 05/26/21 0525 05/26/21 0719  BP: (!) 148/75 (!) 136/57 129/60 (!) 116/57  Pulse: (!) 59 68 64 61  Resp: 16 16 17 18   Temp: 98.1 F (36.7 C) 97.7 F (36.5 C) 98.1 F (36.7 C) 98.6 F (37 C)  TempSrc: Oral     SpO2: 100% 100% 100% 99%  Weight:      Height:        Intake/Output Summary (Last 24 hours) at 05/26/2021 0726 Last data filed at 05/25/2021 1626 Gross per 24 hour  Intake 340 ml  Output 231 ml  Net 109 ml   Filed Weights   05/17/21 1251 05/18/21 1545  Weight: 66 kg 60.4 kg    Examination:  General exam: Appears comfortable  Respiratory system: clear breath sounds b/l  Cardiovascular system: S1/S2+. No rubs or clicks  Gastrointestinal system: Abd is soft, NT, ND & hypoactive bowel sounds  Central nervous system: Alert and awake. Moves all extremities  Psychiatry: Judgement and insight appears poor. Flat mood and affect     Data Reviewed: I have personally reviewed following labs and imaging studies  CBC: Recent Labs  Lab 05/25/21 0328 05/26/21 0405  WBC 4.9 4.7  HGB 14.0 12.6  HCT 40.2 35.9*  MCV 85.7 85.3  PLT 100* 716*   Basic Metabolic Panel: Recent Labs  Lab 05/25/21 0328 05/26/21 0405  NA 133* 133*  K 3.9 3.8  CL 96* 98  CO2 25 26  GLUCOSE 146* 146*  BUN  57* 43*  CREATININE 5.08* 4.12*  CALCIUM 8.5* 8.2*  PHOS 4.3  --    GFR: Estimated Creatinine Clearance: 9 mL/min (A) (by C-G formula based on SCr of 4.12 mg/dL (H)). Liver Function Tests: Recent Labs  Lab 05/25/21 0328  ALBUMIN 3.4*   No results for input(s): LIPASE, AMYLASE in the last 168 hours. Recent Labs  Lab 05/20/21 1128  AMMONIA 13   Coagulation Profile: No results for input(s): INR, PROTIME in the last 168 hours. Cardiac Enzymes: No results for input(s): CKTOTAL, CKMB, CKMBINDEX, TROPONINI in the last 168 hours. BNP (last 3 results) No results for input(s):  PROBNP in the last 8760 hours. HbA1C: No results for input(s): HGBA1C in the last 72 hours. CBG: Recent Labs  Lab 05/25/21 0757 05/25/21 1249 05/25/21 1639 05/25/21 1808 05/25/21 2156  GLUCAP 152* 146* 216* 227* 216*   Lipid Profile: No results for input(s): CHOL, HDL, LDLCALC, TRIG, CHOLHDL, LDLDIRECT in the last 72 hours. Thyroid Function Tests: No results for input(s): TSH, T4TOTAL, FREET4, T3FREE, THYROIDAB in the last 72 hours. Anemia Panel: No results for input(s): VITAMINB12, FOLATE, FERRITIN, TIBC, IRON, RETICCTPCT in the last 72 hours. Sepsis Labs: No results for input(s): PROCALCITON, LATICACIDVEN in the last 168 hours.  Recent Results (from the past 240 hour(s))  Resp Panel by RT-PCR (Flu A&B, Covid) Nasopharyngeal Swab     Status: None   Collection Time: 05/16/21 10:07 PM   Specimen: Nasopharyngeal Swab; Nasopharyngeal(NP) swabs in vial transport medium  Result Value Ref Range Status   SARS Coronavirus 2 by RT PCR NEGATIVE NEGATIVE Final    Comment: (NOTE) SARS-CoV-2 target nucleic acids are NOT DETECTED.  The SARS-CoV-2 RNA is generally detectable in upper respiratory specimens during the acute phase of infection. The lowest concentration of SARS-CoV-2 viral copies this assay can detect is 138 copies/mL. A negative result does not preclude SARS-Cov-2 infection and should not be used as the sole basis for treatment or other patient management decisions. A negative result may occur with  improper specimen collection/handling, submission of specimen other than nasopharyngeal swab, presence of viral mutation(s) within the areas targeted by this assay, and inadequate number of viral copies(<138 copies/mL). A negative result must be combined with clinical observations, patient history, and epidemiological information. The expected result is Negative.  Fact Sheet for Patients:  EntrepreneurPulse.com.au  Fact Sheet for Healthcare Providers:   IncredibleEmployment.be  This test is no t yet approved or cleared by the [place] FDA and  has been authorized for detection and/or diagnosis of SARS-CoV-2 by FDA under an Emergency Use Authorization (EUA). This EUA will remain  in effect (meaning this test can be used) for the duration of the COVID-19 declaration under Section 564(b)(1) of the Act, 21 U.S.C.section 360bbb-3(b)(1), unless the authorization is terminated  or revoked sooner.       Influenza A by PCR NEGATIVE NEGATIVE Final   Influenza B by PCR NEGATIVE NEGATIVE Final    Comment: (NOTE) The Xpert Xpress SARS-CoV-2/FLU/RSV plus assay is intended as an aid in the diagnosis of influenza from Nasopharyngeal swab specimens and should not be used as a sole basis for treatment. Nasal washings and aspirates are unacceptable for Xpert Xpress SARS-CoV-2/FLU/RSV testing.  Fact Sheet for Patients: EntrepreneurPulse.com.au  Fact Sheet for Healthcare Providers: IncredibleEmployment.be  This test is not yet approved or cleared by the [place] FDA and has been authorized for detection and/or diagnosis of SARS-CoV-2 by FDA under an Emergency Use Authorization (EUA). This EUA will remain in  effect (meaning this test can be used) for the duration of the COVID-19 declaration under Section 564(b)(1) of the Act, 21 U.S.C. section 360bbb-3(b)(1), unless the authorization is terminated or revoked.  Performed at [facility], [address], [place], [place] [address]   Urine Culture     Status: Abnormal   Collection Time: 05/16/21 10:07 PM   Specimen: In/Out Cath Urine  Result Value Ref Range Status   Specimen Description   Final    IN/OUT CATH URINE Performed at [facility], [address], [place], [place] [address]    Special Requests   Final    NONE Performed at [facility], [address] [place], [place] [address]     Culture (A)  Final    >=100,000 COLONIES/mL GARDNERELLA VAGINALIS Standardized susceptibility testing for this organism is not available. 80,000 COLONIES/mL DIPHTHEROIDS(CORYNEBACTERIUM SPECIES)    Report Status 05/19/2021 FINAL  Final         Radiology Studies: No results found.      Scheduled Meds:  apixaban  2.5 mg Oral BID   ascorbic acid  500 mg Oral Daily   atorvastatin  80 mg Oral Daily   carvedilol  6.25 mg Oral BID WC   Chlorhexidine Gluconate Cloth  6 each Topical Daily   Chlorhexidine Gluconate Cloth  6 each Topical Q0600   ezetimibe  10 mg Oral Daily   ferrous gluconate  324 mg Oral Q1500   levETIRAcetam  250 mg Oral Once per day on Mon Wed Fri   levETIRAcetam  500 mg Oral BID   losartan  100 mg Oral QHS   mouth rinse  15 mL Mouth Rinse BID   mirtazapine  7.5 mg Oral QHS   multivitamin  1 tablet Oral QHS   risperiDONE  0.5 mg Oral QHS   Continuous Infusions:   LOS: 9 days    Time spent: 15 mins     Wyvonnia Dusky, MD [facility] Pager [phone]  If 7PM-7AM, please contact night-coverage 05/26/2021, 7:26 AM

## 2021-05-26 NOTE — Consult Note (Signed)
[place] Nurse wound consult note Consultation was completed by review of records, images and assistance from the bedside nurse/clinical staff.   Reason for Consult: non healing pressure injury Wound type: Unstageable Pressure Injury Pressure Injury POA: Ye Measurement:4cm x 3cm x 0.5cm  Wound bed: see nursing flow sheet Drainage (amount, consistency, odor) moderate, brown, with odor Periwound: intact  Dressing procedure/placement/frequency:  Clean wound with saline, pat dry. Apply 1/4"layer of Santyl over necrotic tissue, top with saline moist gauze and dry dressing. Change daily  Add air mattress  Add RD for wound healing.  [place] Nurse team will follow with you and see patient within 10 days for wound assessments.  Please notify [place] nurses of any acute changes in the wounds or any new areas of concern [place] MSN, [place], [place], [place]

## 2021-05-27 DIAGNOSIS — Z992 Dependence on renal dialysis: Secondary | ICD-10-CM | POA: Diagnosis not present

## 2021-05-27 DIAGNOSIS — N186 End stage renal disease: Secondary | ICD-10-CM | POA: Diagnosis not present

## 2021-05-27 DIAGNOSIS — D696 Thrombocytopenia, unspecified: Secondary | ICD-10-CM | POA: Diagnosis not present

## 2021-05-27 DIAGNOSIS — G309 Alzheimer's disease, unspecified: Secondary | ICD-10-CM | POA: Diagnosis not present

## 2021-05-27 LAB — GLUCOSE, CAPILLARY
Glucose-Capillary: 154 mg/dL — ABNORMAL HIGH (ref 70–99)
Glucose-Capillary: 174 mg/dL — ABNORMAL HIGH (ref 70–99)
Glucose-Capillary: 109 mg/dL — ABNORMAL HIGH (ref 70–99)
Glucose-Capillary: 138 mg/dL — ABNORMAL HIGH (ref 70–99)

## 2021-05-27 LAB — CBC
HCT: 35.7 % — ABNORMAL LOW (ref 36.0–46.0)
Hemoglobin: 12.6 g/dL (ref 12.0–15.0)
MCH: 29.9 pg (ref 26.0–34.0)
MCHC: 35.3 g/dL (ref 30.0–36.0)
MCV: 84.8 fL (ref 80.0–100.0)
Platelets: 127 10*3/uL — ABNORMAL LOW (ref 150–400)
RBC: 4.21 MIL/uL (ref 3.87–5.11)
RDW: 12.7 % (ref 11.5–15.5)
WBC: 4.6 10*3/uL (ref 4.0–10.5)
nRBC: 0 % (ref 0.0–0.2)

## 2021-05-27 LAB — BASIC METABOLIC PANEL
Anion gap: 11 (ref 5–15)
BUN: 63 mg/dL — ABNORMAL HIGH (ref 8–23)
CO2: 23 mmol/L (ref 22–32)
Calcium: 8.6 mg/dL — ABNORMAL LOW (ref 8.9–10.3)
Chloride: 99 mmol/L (ref 98–111)
Creatinine, Ser: 6.12 mg/dL — ABNORMAL HIGH (ref 0.44–1.00)
GFR, Estimated: 6 mL/min — ABNORMAL LOW (ref >60)
Glucose, Bld: 169 mg/dL — ABNORMAL HIGH (ref 70–99)
Potassium: 3.9 mmol/L (ref 3.5–5.1)
Sodium: 133 mmol/L — ABNORMAL LOW (ref 135–145)

## 2021-05-27 LAB — RESP PANEL BY RT-PCR (FLU A&B, COVID) ARPGX2
Influenza A by PCR: NEGATIVE
Influenza B by PCR: NEGATIVE
SARS Coronavirus 2 by RT PCR: NEGATIVE

## 2021-05-27 MED ORDER — HEPARIN SODIUM (PORCINE) 1000 UNIT/ML IJ SOLN
INTRAMUSCULAR | Status: AC
  Filled 2021-05-27: qty 10

## 2021-05-27 MED ORDER — NEPRO/CARBSTEADY PO LIQD
237.0000 mL | Freq: Two times a day (BID) | ORAL | Status: DC
  Administered 2021-05-28 – 2021-05-31 (×4): 237 mL via ORAL

## 2021-05-27 MED ORDER — ASCORBIC ACID 500 MG PO TABS
500.0000 mg | ORAL_TABLET | Freq: Two times a day (BID) | ORAL | Status: DC
  Administered 2021-05-27 – 2021-06-02 (×11): 500 mg via ORAL
  Filled 2021-05-27 (×12): qty 1

## 2021-05-27 NOTE — Progress Notes (Signed)
PT Cancellation Note  Patient Details Name: Brianna Dodson MRN: [id number] DOB: 10/12/1933   Cancelled Treatment:    Reason Eval/Treat Not Completed: Patient at procedure or test/unavailable (Patient currently off unit for dialysis. Will re-attempt at later time/date as medically appropriate and available.)   Quamel Fitzmaurice H. Owens Shark, PT, DPT, NCS 05/27/21, 1:00 PM [phone]

## 2021-05-27 NOTE — TOC Progression Note (Signed)
Transition of Care [facility]) - Progression Note    Patient Details  Name: Brianna Dodson MRN: [id number] Date of Birth: 1934-02-02  Transition of [facility] CM/SW Contact  Kerin Salen, RN Phone Number: 05/27/2021, 4:44 PM  Clinical Narrative: Patient's sister, Brianna Dodson and Daughter Brianna Dodson both decided for patient not to discharge to [facility]. Patient to discharge home, sister Brianna Dodson says she will make arrangements for coverage for patient to be taken care of at home. Hospital bed, 3in1 ordered with [employer].     Expected Discharge Plan: [place] Barriers to Discharge: Continued Medical Work up  Expected Discharge Plan and Services Expected Discharge Plan: [place] Acute Care Choice: Resumption of Svcs/PTA Provider Living arrangements for the past 2 months: Single Family Home                                       Social Determinants of Health (SDOH) Interventions    Readmission Risk Interventions Readmission Risk Prevention Plan 05/18/2021 03/03/2021 03/16/2019  Transportation Screening Complete Complete Complete  PCP or Specialist Appt within 5-7 Days - - Complete  PCP or Specialist Appt within 3-5 Days Complete Complete -  Home Care Screening - - Complete  Medication Review (RN CM) - - Complete  HRI or Home Care Consult Complete Complete -  Social Work Consult for Justice Planning/Counseling Complete Not Complete -  SW consult not completed comments - RNCM assigned to patient -  Palliative Care Screening Not Applicable Not Applicable -  Medication Review (RN Care Manager) Complete Complete -  Some recent data might be hidden

## 2021-05-27 NOTE — Plan of Care (Signed)

## 2021-05-27 NOTE — Progress Notes (Signed)
PROGRESS NOTE    Brianna Dodson  URK:[id number] DOB: 18-Jun-1934 DOA: 05/16/2021 PCP: [name], MD    Assessment & Plan:   Principal Problem:   Agitation Active Problems:   Essential hypertension   Type II diabetes mellitus with renal manifestations ([place])   Atrial fibrillation (HCC)   Mixed Alzheimer's and vascular dementia ([place])   End stage renal disease on dialysis ([place])   Recurrent strokes (HCC)   Thrombocytopenia ([place])   Chronic anticoagulation   Pressure injury of skin   Hypertensive urgency   Post-ictal state ([place])  Acute encephalopathy:  suspected metabolic with malignant hypertension, Alzheimer's & vascular dementia. CT head shows no acute intracranial abnormality but chronic right MCA infarct & chronic atrophy. EEG suggestive of cortical dysfunction arising from left frontotemporal region, nonspecific etiology but could be secondary to underlying structural abnormality, post-ictal state.  No seizures or epileptiform discharges were seen throughout the recording. Continue on keppra, remeron, & risperdal. Haldol prn. Neuro & psych recs are apprec   ESRD: on HD MWF. Management as per nephro   Thrombocytopenia: etiology unclear. Labile    DM2: well controlled, HbA1c 5.5. Diet controlled   Hx of CVA:  with vascular dementia. MRI of the brain shows no acute CVA, chronic CVAs   HLD: continue on zetia, statin    Malignant HTN: resolved but still w/ HTN. Continue on coreg, losartan   DVT prophylaxis: eliquis  Code Status: full  Family Communication: called pt's sister, [name], and answered her questions. Margaret refused for pt to go to [facility] request the pt come home and also request a hospital bed. HH, hospital bed & 3N1 orders placed Disposition Plan: d/c to home w/ family care but equipment needs to be delivered to pt's home prior to d/c  Level of care: Med-Surg  Status is: Inpatient  Remains inpatient appropriate because:  [name] refused for pt to go  to [place] & request the pt come home and also request a hospital bed. HH, hospital bed & 3N1 orders placed   Consultants:  Nephro   Procedures:   Antimicrobials:   Subjective: Pt c/o fatigue   Objective: Vitals:   05/26/21 0719 05/26/21 1515 05/26/21 2038 05/27/21 0255  BP: (!) 116/57 (!) 104/47 110/74 (!) 153/86  Pulse: 61 66 66 69  Resp: 18 18 18 16   Temp: 98.6 F (37 C) 98.6 F (37 C) 98 F (36.7 C) 97.6 F (36.4 C)  TempSrc:   Oral Oral  SpO2: 99% 99% 93% 100%  Weight:      Height:        Intake/Output Summary (Last 24 hours) at 05/27/2021 0729 Last data filed at 05/26/2021 1847 Gross per 24 hour  Intake 720 ml  Output --  Net 720 ml   Filed Weights   05/17/21 1251 05/18/21 1545  Weight: 66 kg 60.4 kg    Examination:  General exam: Appears comfortable  Respiratory system: clear breath sounds b/l  Cardiovascular system: S1 & S2+. No rubs or clicks  Gastrointestinal system: Abd is soft, NT, ND & hypoactive bowel sounds  Central nervous system: Alert and awake. Moves all extremities  Psychiatry: Judgement and insight appear poor. Flat mood and affect    Data Reviewed: I have personally reviewed following labs and imaging studies  CBC: Recent Labs  Lab 05/25/21 0328 05/26/21 0405 05/27/21 0524  WBC 4.9 4.7 4.6  HGB 14.0 12.6 12.6  HCT 40.2 35.9* 35.7*  MCV 85.7 85.3 84.8  PLT 100* 117* 127*  Basic Metabolic Panel: Recent Labs  Lab 05/25/21 0328 05/26/21 0405 05/27/21 0524  NA 133* 133* 133*  K 3.9 3.8 3.9  CL 96* 98 99  CO2 25 26 23   GLUCOSE 146* 146* 169*  BUN 57* 43* 63*  CREATININE 5.08* 4.12* 6.12*  CALCIUM 8.5* 8.2* 8.6*  PHOS 4.3  --   --    GFR: Estimated Creatinine Clearance: 6 mL/min (A) (by C-G formula based on SCr of 6.12 mg/dL (H)). Liver Function Tests: Recent Labs  Lab 05/25/21 0328  ALBUMIN 3.4*   No results for input(s): LIPASE, AMYLASE in the last 168 hours. Recent Labs  Lab 05/20/21 1128  AMMONIA 13    Coagulation Profile: No results for input(s): INR, PROTIME in the last 168 hours. Cardiac Enzymes: No results for input(s): CKTOTAL, CKMB, CKMBINDEX, TROPONINI in the last 168 hours. BNP (last 3 results) No results for input(s): PROBNP in the last 8760 hours. HbA1C: No results for input(s): HGBA1C in the last 72 hours. CBG: Recent Labs  Lab 05/25/21 2156 05/26/21 0719 05/26/21 1140 05/26/21 1639 05/26/21 2209  GLUCAP 216* 145* 190* 310* 167*   Lipid Profile: No results for input(s): CHOL, HDL, LDLCALC, TRIG, CHOLHDL, LDLDIRECT in the last 72 hours. Thyroid Function Tests: No results for input(s): TSH, T4TOTAL, FREET4, T3FREE, THYROIDAB in the last 72 hours. Anemia Panel: No results for input(s): VITAMINB12, FOLATE, FERRITIN, TIBC, IRON, RETICCTPCT in the last 72 hours. Sepsis Labs: No results for input(s): PROCALCITON, LATICACIDVEN in the last 168 hours.  No results found for this or any previous visit (from the past 240 hour(s)).        Radiology Studies: No results found.      Scheduled Meds:  apixaban  2.5 mg Oral BID   ascorbic acid  500 mg Oral Daily   atorvastatin  80 mg Oral Daily   carvedilol  6.25 mg Oral BID WC   Chlorhexidine Gluconate Cloth  6 each Topical Daily   Chlorhexidine Gluconate Cloth  6 each Topical Q0600   collagenase   Topical Daily   ezetimibe  10 mg Oral Daily   ferrous gluconate  324 mg Oral Q1500   insulin aspart  0-9 Units Subcutaneous TID WC   levETIRAcetam  250 mg Oral Once per day on Mon Wed Fri   levETIRAcetam  500 mg Oral BID   losartan  100 mg Oral QHS   mouth rinse  15 mL Mouth Rinse BID   mirtazapine  7.5 mg Oral QHS   multivitamin  1 tablet Oral QHS   risperiDONE  0.5 mg Oral QHS   Continuous Infusions:   LOS: 10 days    Time spent: 15 mins     Wyvonnia Dusky, MD [facility] Pager [phone]  If 7PM-7AM, please contact night-coverage 05/27/2021, 7:29 AM

## 2021-05-27 NOTE — Care Management Important Message (Signed)
Important Message  Patient Details  Name: Brianna Dodson MRN: [id number] Date of Birth: Sep 30, 1933   Medicare Important Message Given:  Other (see comment)  Patient out of room upon time of visit, no family.  Copy of Medicare IM left on counter in patient's room.    Dannette Barbara 05/27/2021, 2:53 PM

## 2021-05-27 NOTE — Progress Notes (Signed)
Initial Nutrition Assessment  DOCUMENTATION CODES:   Not applicable  INTERVENTION:   Nepro Shake po BID, each supplement provides 425 kcal and 19 grams protein  Rena-vit po daily   Vitamin C 500mg  po BID  NUTRITION DIAGNOSIS:   Increased nutrient needs related to wound healing as evidenced by estimated needs.  GOAL:   Patient will meet greater than or equal to 90% of their needs  MONITOR:   PO intake, Supplement acceptance, Labs, Weight trends, Skin, I & O's  REASON FOR ASSESSMENT:   Consult Assessment of nutrition requirement/status  ASSESSMENT:   87 y.o. black female with end stage renal disease on hemodialysis, hypertension, CVA, vascular dementia, hyperlipidemia, diabetes mellitus type II and atrial fibrillation who is admitted to [facility] on 05/16/2021 for confusion  Unable to see pt today as pt in HD at time of RD visit. Pt documented to be eating 50-90% of meals in hospital. Per chart, there are some concerns that pt may be aspirating; SLP evaluation pending. RD will add supplements and vitamins to help pt meet her estimated needs and to support wound healing. Per chart, pt appears weight stable at baseline; suspect pt with good oral intake at baseline. RD will obtain nutrition related history and exam at follow-up.   Medications reviewed and include: vitamin C, ferrous sulfate, insulin, mirtazapine, rena-vit  Labs reviewed: Na 133(L), BUN 63(H), creat 6.12(H) Cbgs- 174, 154 x 24 hrs AIC 5.5- 11/15  NUTRITION - FOCUSED PHYSICAL EXAM: Unable to perform at this time   Diet Order:   Diet Order             Diet renal/carb modified with fluid restriction Diet-HS Snack? Nothing; Fluid restriction: 1200 mL Fluid; Room service appropriate? Yes; Fluid consistency: Thin  Diet effective now                  EDUCATION NEEDS:   Not appropriate for education at this time  Skin:  Skin Assessment: Reviewed RN Assessment (UPI sacrum 4cm x 3cm x 0.5cm)  Last BM:   11/24  Height:   Ht Readings from Last 1 Encounters:  05/17/21 5\' 5"  (1.651 m)    Weight:   Wt Readings from Last 1 Encounters:  04/12/21 65.9 kg    Ideal Body Weight:  56.8 kg  BMI:  Body mass index is 22.16 kg/m.  Estimated Nutritional Needs:   Kcal:  1500-1700kcal/day  Protein:  75-85g/day  Fluid:  UOP +1L  Koleen Distance MS, RD, LDN Please refer to [facility] for RD and/or RD on-call/weekend/after hours pager

## 2021-05-27 NOTE — Progress Notes (Signed)
Occupational Therapy Treatment Patient Details Name: Brianna Dodson MRN: [id number] DOB: 1933-07-14 Today's Date: 05/27/2021   History of present illness Pt is an 87 y/o F with PMH: Alzheimer's dementia with vascular dementia and multiple strokes including R MCA March 2022 with residual L side weakness and reportedly worsened L side weakess after AV fistual sx on 02/24/21(Dr. Dew), then underwent ligation of the L UE HD site on 8/27 to improve perfusion. Pt presents this admission d/t agitation and altered mental status x3 days with family ultimately bringing pt to ED when pt became agitated at HD tx. Pt adm and tx for Acute encephalopathy (suspected metabolic) with malignant hypertension and acute delerium of unclear etiology.   OT comments  Pt seen for OT Tx this date to f/u re: safety with ADLs/ADL mobility. Pt requires MOD A for arm in arm transfers including STS and SPS to/from [facility]. Pt is able to follow most simple commands, but continues to be somewhat impulsive and have instance of garbled speech and agitation. She is able to easily be re-directed. Pt unable to void on commode and nephro practitioner notified. Pt back to bed with MOD A. She requires MAX A for repositioning and rolling to apply salve to her bottom at her request. Pt placed in chair position with support on her weaker L side for improved positioning during meal time. Pt was noted by RN to be pocketing eggs, but appears to be more successful with thickened items like pudding. (RN aware and had removed breakfast tray and request SLP f/u). Will continue to follow acutely. Alarm set and all needs in reach at end of session. Continue to anticipate pt will require STR f/u.    Recommendations for follow up therapy are one component of a multi-disciplinary discharge planning process, led by the attending physician.  Recommendations may be updated based on patient status, additional functional criteria and insurance authorization.    Follow Up  Recommendations  Skilled nursing-short term rehab (<3 hours/day)    Assistance Recommended at Discharge Frequent or constant Supervision/Assistance  Equipment Recommendations  Other (comment) (defer)    Recommendations for Other Services      Precautions / Restrictions Precautions Precautions: Fall Restrictions Weight Bearing Restrictions: No       Mobility Bed Mobility Overal bed mobility: Needs Assistance Bed Mobility: Supine to Sit;Sit to Supine     Supine to sit: Max assist;HOB elevated Sit to supine: Mod assist;HOB elevated   General bed mobility comments: increased time    Transfers Overall transfer level: Needs assistance Equipment used: 1 person hand held assist Transfers: Sit to/from Stand;Bed to chair/wheelchair/BSC Sit to Stand: Mod assist;From elevated surface Stand pivot transfers: Mod assist;From elevated surface         General transfer comment: MOD A arm in arm to STS and SPS from bed to/from [facility]     Balance Overall balance assessment: Needs assistance Sitting-balance support: Feet supported;Single extremity supported Sitting balance-Leahy Scale: Fair Sitting balance - Comments: Some bouts of G sattic sitting, but primarily at least requires R UE support or external support of MIN A for safety. Poor righting response with significant anterior or R lateral lean when fatiguing Postural control: Right lateral lean;Other (comment) (anterior) Standing balance support: Bilateral upper extremity supported Standing balance-Leahy Scale: Poor Standing balance comment: arm in arm, MOD A to sustain static stand and complete transfers  ADL either performed or assessed with clinical judgement   ADL                                              Extremity/Trunk Assessment              Vision       Perception     Praxis      Cognition Arousal/Alertness: Awake/alert Behavior During Therapy:  Agitated;WFL for tasks assessed/performed (alternating) Overall Cognitive Status: History of cognitive impairments - at baseline                                 General Comments: Pt able to follow most simple one step commands with increased processing time, she can say a few words and phrases clearly, but is otherwise diffiult to understand/garbled. And sometimes becomes agitated when asked to repeat herself. She is oriented to self only. Poor safety awareness.          Exercises Other Exercises Other Exercises: OT ed re: safety with meal time, pt noted to be pocketing foods and requires cues to clear her mouth. Ultimately, scrambled eggs taken from pt by RN d/t not safe and SLP asked to come re-assess. OT only allows pt to finish breakfast with thickened items such as pudding or jello.   Shoulder Instructions       General Comments      Pertinent Vitals/ Pain       Pain Assessment: No/denies pain Breathing: normal Negative Vocalization: none Facial Expression: smiling or inexpressive Body Language: relaxed Consolability: no need to console PAINAD Score: 0  Home Living                                          Prior Functioning/Environment              Frequency  Min 1X/week        Progress Toward Goals  OT Goals(current goals can now be found in the care plan section)  Progress towards OT goals: Progressing toward goals  Acute Rehab OT Goals Patient Stated Goal: to eat breakfast OT Goal Formulation: With patient Time For Goal Achievement: 06/02/21 Potential to Achieve Goals: [place] Discharge plan remains appropriate;Frequency remains appropriate    Co-evaluation                 AM-PAC OT "6 Clicks" Daily Activity     Outcome Measure   Help from another person eating meals?: A Lot Help from another person taking care of personal grooming?: A Lot Help from another person toileting, which includes using toliet,  bedpan, or urinal?: A Lot Help from another person bathing (including washing, rinsing, drying)?: A Lot Help from another person to put on and taking off regular upper body clothing?: A Lot Help from another person to put on and taking off regular lower body clothing?: A Lot 6 Click Score: 12    End of Session Equipment Utilized During Treatment: Gait belt  OT Visit Diagnosis: Unsteadiness on feet (R26.81);Other abnormalities of gait and mobility (R26.89);Feeding difficulties (R63.3)   Activity Tolerance Patient tolerated treatment well   Patient Left with call bell/phone within reach;in bed;with bed alarm set  Nurse Communication Other (comment) (notified of concerns with meals and RN is aware and has requested that SLP re-assess)        Time: 0933-1000 OT Time Calculation (min): 27 min  Charges: OT General Charges $OT Visit: 1 Visit OT Treatments $Self Care/Home Management : 8-22 mins $Therapeutic Activity: 8-22 mins  Gerrianne Scale, [place], OTR/L ascom [phone] 05/27/21, 12:18 PM

## 2021-05-27 NOTE — Progress Notes (Signed)
Dressing changed as ordered. Pt tolerated well. Will continue to monitor.

## 2021-05-27 NOTE — Plan of Care (Signed)
  Problem: Education: Goal: Knowledge of General Education information will improve Description: Including pain rating scale, medication(s)/side effects and non-pharmacologic comfort measures Outcome: Progressing   Problem: Health Behavior/Discharge Planning: Goal: Ability to manage health-related needs will improve Outcome: Progressing   Problem: Clinical Measurements: Goal: Ability to maintain clinical measurements within normal limits will improve Outcome: Progressing Goal: Will remain free from infection Outcome: Progressing Goal: Diagnostic test results will improve Outcome: Progressing Goal: Respiratory complications will improve Outcome: Progressing Goal: Cardiovascular complication will be avoided Outcome: Progressing   Problem: Nutrition: Goal: Adequate nutrition will be maintained Outcome: Progressing   Problem: Pain Managment: Goal: General experience of comfort will improve Outcome: Progressing   Problem: Safety: Goal: Ability to remain free from injury will improve Outcome: Progressing   Problem: Skin Integrity: Goal: Risk for impaired skin integrity will decrease Outcome: Progressing   

## 2021-05-27 NOTE — Progress Notes (Signed)
[facility]  ROUNDING NOTE   Subjective:   Patient laying in bed during physical therapy Per therapy, pt pocketing and coughing with breakfast and nursing removed tray to request swallow eval.  Pt argumentative that she eats fine and has to urinate.  Therapy assisted patient to [facility] with no results  Objective:  Vital signs in last 24 hours:  Temp:  [97.6 F (36.4 C)-98.6 F (37 C)] 98 F (36.7 C) (11/25 0744) Pulse Rate:  [66-69] 67 (11/25 0744) Resp:  [16-18] 17 (11/25 0744) BP: (104-153)/(47-86) 139/62 (11/25 0744) SpO2:  [93 %-100 %] 98 % (11/25 0744)  Weight change:  Filed Weights   05/17/21 1251 05/18/21 1545  Weight: 66 kg 60.4 kg    Intake/Output: I/O last 3 completed shifts: In: 720 [P.O.:720] Out: -    Intake/Output this shift:  No intake/output data recorded.  Physical Exam: General: Laying in the bed, no acute distress  Head: Normocephalic, atraumatic. Moist oral mucosal membranes  Lungs:  Clear to auscultation, normal effort  Heart: No rub or gallop  Abdomen:  Soft, nontender, nondistended  Extremities:  No peripheral edema.  Neurologic: Alert to self and place,  Left sided weakness.   Skin: No lesions  Access: Right IJ permcath    Basic Metabolic Panel: Recent Labs  Lab 05/25/21 0328 05/26/21 0405 05/27/21 0524  NA 133* 133* 133*  K 3.9 3.8 3.9  CL 96* 98 99  CO2 25 26 23   GLUCOSE 146* 146* 169*  BUN 57* 43* 63*  CREATININE 5.08* 4.12* 6.12*  CALCIUM 8.5* 8.2* 8.6*  PHOS 4.3  --   --      Liver Function Tests: Recent Labs  Lab 05/25/21 0328  ALBUMIN 3.4*    No results for input(s): LIPASE, AMYLASE in the last 168 hours. No results for input(s): AMMONIA in the last 168 hours.   CBC: Recent Labs  Lab 05/25/21 0328 05/26/21 0405 05/27/21 0524  WBC 4.9 4.7 4.6  HGB 14.0 12.6 12.6  HCT 40.2 35.9* 35.7*  MCV 85.7 85.3 84.8  PLT 100* 117* 127*     Cardiac Enzymes: No results for input(s): CKTOTAL, CKMB,  CKMBINDEX, TROPONINI in the last 168 hours.  BNP: Invalid input(s): POCBNP  CBG: Recent Labs  Lab 05/26/21 1140 05/26/21 1639 05/26/21 2209 05/27/21 0746 05/27/21 1142  GLUCAP 190* 310* 167* 154* 174*     Microbiology: Results for orders placed or performed during the hospital encounter of 05/16/21  Resp Panel by RT-PCR (Flu A&B, Covid) Nasopharyngeal Swab     Status: None   Collection Time: 05/16/21 10:07 PM   Specimen: Nasopharyngeal Swab; Nasopharyngeal(NP) swabs in vial transport medium  Result Value Ref Range Status   SARS Coronavirus 2 by RT PCR NEGATIVE NEGATIVE Final    Comment: (NOTE) SARS-CoV-2 target nucleic acids are NOT DETECTED.  The SARS-CoV-2 RNA is generally detectable in upper respiratory specimens during the acute phase of infection. The lowest concentration of SARS-CoV-2 viral copies this assay can detect is 138 copies/mL. A negative result does not preclude SARS-Cov-2 infection and should not be used as the sole basis for treatment or other patient management decisions. A negative result may occur with  improper specimen collection/handling, submission of specimen other than nasopharyngeal swab, presence of viral mutation(s) within the areas targeted by this assay, and inadequate number of viral copies(<138 copies/mL). A negative result must be combined with clinical observations, patient history, and epidemiological information. The expected result is Negative.  Fact Sheet for Patients:  EntrepreneurPulse.com.au  Fact Sheet for Healthcare Providers:  IncredibleEmployment.be  This test is no t yet approved or cleared by the [place] FDA and  has been authorized for detection and/or diagnosis of SARS-CoV-2 by FDA under an Emergency Use Authorization (EUA). This EUA will remain  in effect (meaning this test can be used) for the duration of the COVID-19 declaration under Section 564(b)(1) of the Act,  21 U.S.C.section 360bbb-3(b)(1), unless the authorization is terminated  or revoked sooner.       Influenza A by PCR NEGATIVE NEGATIVE Final   Influenza B by PCR NEGATIVE NEGATIVE Final    Comment: (NOTE) The Xpert Xpress SARS-CoV-2/FLU/RSV plus assay is intended as an aid in the diagnosis of influenza from Nasopharyngeal swab specimens and should not be used as a sole basis for treatment. Nasal washings and aspirates are unacceptable for Xpert Xpress SARS-CoV-2/FLU/RSV testing.  Fact Sheet for Patients: EntrepreneurPulse.com.au  Fact Sheet for Healthcare Providers: IncredibleEmployment.be  This test is not yet approved or cleared by the [place] FDA and has been authorized for detection and/or diagnosis of SARS-CoV-2 by FDA under an Emergency Use Authorization (EUA). This EUA will remain in effect (meaning this test can be used) for the duration of the COVID-19 declaration under Section 564(b)(1) of the Act, 21 U.S.C. section 360bbb-3(b)(1), unless the authorization is terminated or revoked.  Performed at [facility], [address], [place], [place] [address]   Urine Culture     Status: Abnormal   Collection Time: 05/16/21 10:07 PM   Specimen: In/Out Cath Urine  Result Value Ref Range Status   Specimen Description   Final    IN/OUT CATH URINE Performed at [facility], [address], [place], [place] [address]    Special Requests   Final    NONE Performed at [facility], [address] [place], [place] [address]    Culture (A)  Final    >=100,000 COLONIES/mL GARDNERELLA VAGINALIS Standardized susceptibility testing for this organism is not available. 80,000 COLONIES/mL DIPHTHEROIDS(CORYNEBACTERIUM SPECIES)    Report Status 05/19/2021 FINAL  Final    Coagulation Studies: No results for input(s): LABPROT, INR in the last 72 hours.  Urinalysis: No results for input(s): COLORURINE, LABSPEC,  PHURINE, GLUCOSEU, HGBUR, BILIRUBINUR, KETONESUR, PROTEINUR, UROBILINOGEN, NITRITE, LEUKOCYTESUR in the last 72 hours.  Invalid input(s): APPERANCEUR     Imaging: No results found.   Medications:      apixaban  2.5 mg Oral BID   ascorbic acid  500 mg Oral Daily   atorvastatin  80 mg Oral Daily   carvedilol  6.25 mg Oral BID WC   Chlorhexidine Gluconate Cloth  6 each Topical Daily   Chlorhexidine Gluconate Cloth  6 each Topical Q0600   collagenase   Topical Daily   ezetimibe  10 mg Oral Daily   ferrous gluconate  324 mg Oral Q1500   insulin aspart  0-9 Units Subcutaneous TID WC   levETIRAcetam  250 mg Oral Once per day on Mon Wed Fri   levETIRAcetam  500 mg Oral BID   losartan  100 mg Oral QHS   mouth rinse  15 mL Mouth Rinse BID   mirtazapine  7.5 mg Oral QHS   multivitamin  1 tablet Oral QHS   risperiDONE  0.5 mg Oral QHS   acetaminophen **OR** acetaminophen, haloperidol lactate, labetalol, ondansetron **OR** ondansetron (ZOFRAN) IV, risperiDONE, senna-docusate  Assessment/ Plan:  [name] is a 87 y.o. black female with end stage renal  disease on hemodialysis, hypertension, CVA, vascular dementia, hyperlipidemia, diabetes mellitus type II, atrial fibrillation who is admitted to [facility] on 05/16/2021 for Confusion [R41.0] Agitation [R45.1]   [facility] MWF [facility] [address] RIJ permcath 65.5kg   End Stage Renal Disease:  - Scheduled for dialysis later today seated in chair. UF goal 0.5L - Recommend I&O cath to evaluate urgency    Altered mental status:  - Levetiracetam at neurology recommendations of 500mg  twice a day with additional 250 mg dose after dialysis. Patient with history of chronic right MCA stroke and chronic atrophy.  Abnormal EEG.   Hypertension: with hypertension urgency on admission. Home regimen includes losartan, amlodipine, carvedilol, hydralazine.   - IV labatelol PRN  -Managed with losartan, carvedilol - BP stable 139/62   Anemia of chronic  kidney disease:  Lab Results  Component Value Date   HGB 12.6 05/27/2021   No indication for Epogen   Secondary Hyperparathyroidism:  Lab Results  Component Value Date   PTH 83 (H) 01/06/2016   CALCIUM 8.6 (L) 05/27/2021   CAION 1.04 (L) 02/24/2021   PHOS 4.3 05/25/2021     - Will continue to monitor bone minerals during this admission   LOS: [place] 11/25/202211:44 AM

## 2021-05-28 DIAGNOSIS — D696 Thrombocytopenia, unspecified: Secondary | ICD-10-CM | POA: Diagnosis not present

## 2021-05-28 DIAGNOSIS — N186 End stage renal disease: Secondary | ICD-10-CM | POA: Diagnosis not present

## 2021-05-28 DIAGNOSIS — Z992 Dependence on renal dialysis: Secondary | ICD-10-CM | POA: Diagnosis not present

## 2021-05-28 DIAGNOSIS — G309 Alzheimer's disease, unspecified: Secondary | ICD-10-CM | POA: Diagnosis not present

## 2021-05-28 LAB — BASIC METABOLIC PANEL
Anion gap: 10 (ref 5–15)
BUN: 41 mg/dL — ABNORMAL HIGH (ref 8–23)
CO2: 24 mmol/L (ref 22–32)
Calcium: 8.7 mg/dL — ABNORMAL LOW (ref 8.9–10.3)
Chloride: 101 mmol/L (ref 98–111)
Creatinine, Ser: 4.16 mg/dL — ABNORMAL HIGH (ref 0.44–1.00)
GFR, Estimated: 10 mL/min — ABNORMAL LOW (ref >60)
Glucose, Bld: 207 mg/dL — ABNORMAL HIGH (ref 70–99)
Potassium: 4.2 mmol/L (ref 3.5–5.1)
Sodium: 135 mmol/L (ref 135–145)

## 2021-05-28 LAB — CBC
HCT: 35 % — ABNORMAL LOW (ref 36.0–46.0)
Hemoglobin: 12.3 g/dL (ref 12.0–15.0)
MCH: 29.9 pg (ref 26.0–34.0)
MCHC: 35.1 g/dL (ref 30.0–36.0)
MCV: 85 fL (ref 80.0–100.0)
Platelets: 122 10*3/uL — ABNORMAL LOW (ref 150–400)
RBC: 4.12 MIL/uL (ref 3.87–5.11)
RDW: 13 % (ref 11.5–15.5)
WBC: 13.8 10*3/uL — ABNORMAL HIGH (ref 4.0–10.5)
nRBC: 0 % (ref 0.0–0.2)

## 2021-05-28 LAB — GLUCOSE, CAPILLARY
Glucose-Capillary: 198 mg/dL — ABNORMAL HIGH (ref 70–99)
Glucose-Capillary: 130 mg/dL — ABNORMAL HIGH (ref 70–99)
Glucose-Capillary: 174 mg/dL — ABNORMAL HIGH (ref 70–99)
Glucose-Capillary: 224 mg/dL — ABNORMAL HIGH (ref 70–99)

## 2021-05-28 NOTE — Plan of Care (Signed)

## 2021-05-28 NOTE — Progress Notes (Signed)
PROGRESS NOTE    Brianna Dodson  VOH:[id number] DOB: September 18, 1933 DOA: 05/16/2021 PCP: [name], MD    Assessment & Plan:   Principal Problem:   Agitation Active Problems:   Essential hypertension   Type II diabetes mellitus with renal manifestations ([place])   Atrial fibrillation (HCC)   Mixed Alzheimer's and vascular dementia ([place])   End stage renal disease on dialysis ([place])   Recurrent strokes (HCC)   Thrombocytopenia ([place])   Chronic anticoagulation   Pressure injury of skin   Hypertensive urgency   Post-ictal state ([place])  Acute encephalopathy:  suspected metabolic with malignant hypertension, Alzheimer's & vascular dementia. CT head shows no acute intracranial abnormality but chronic right MCA infarct & chronic atrophy. EEG suggestive of cortical dysfunction arising from left frontotemporal region, nonspecific etiology but could be secondary to underlying structural abnormality, post-ictal state.  No seizures or epileptiform discharges were seen throughout the recording. Continue on keppra, remeron, & risperdal. Haldol prn. Neuro & psych recs are apprec   ESRD: on HD MWF. Management as per nephro   Thrombocytopenia: labile. Etiology unclear     DM2: HbA1c 5.5, well controlled. Diet controlled    Hx of CVA:  with vascular dementia. MRI of the brain shows no acute CVA, chronic CVAs   HLD: continue on zetia, statin    Malignant HTN: resolved but still w/ HTN. Continue on losartan, coreg    DVT prophylaxis: eliquis  Code Status: full  Family Communication: called pt's sister, [name], and answered her questions. Margaret refused for pt to go to [facility] request the pt come home and also request a hospital bed. HH, hospital bed & 3N1 orders placed Disposition Plan: d/c to home w/ family care but equipment needs to be delivered to pt's home prior to d/c  Level of care: Med-Surg  Status is: Inpatient  Remains inpatient appropriate because:  waiting on DME equipment  to be delivered to pt's house    Consultants:  Nephro   Procedures:   Antimicrobials:   Subjective: Pt c/o malaise   Objective: Vitals:   05/27/21 1609 05/27/21 1628 05/27/21 1705 05/28/21 0615  BP: (!) 149/81  (!) 151/74 140/74  Pulse: 70  75 75  Resp: 19  18 18   Temp: 97.6 F (36.4 C)  97.9 F (36.6 C) 97.9 F (36.6 C)  TempSrc: Oral  Oral   SpO2: 100%  100% 100%  Weight:  57.4 kg    Height:        Intake/Output Summary (Last 24 hours) at 05/28/2021 0730 Last data filed at 05/27/2021 1800 Gross per 24 hour  Intake 720 ml  Output 426 ml  Net 294 ml   Filed Weights   05/18/21 1545 05/27/21 1628  Weight: 60.4 kg 57.4 kg    Examination:  General exam: Appears calm & comfortable  Respiratory system: clear breath sounds b/l  Cardiovascular system: S1/S2+. No rubs or clicks  Gastrointestinal system: Abd is soft, NT, ND & normal bowel sounds  Central nervous system: Alert and awake. Moves all extremities   Psychiatry: judgement and insight appear poor. Flat mood and affect    Data Reviewed: I have personally reviewed following labs and imaging studies  CBC: Recent Labs  Lab 05/25/21 0328 05/26/21 0405 05/27/21 0524 05/28/21 0551  WBC 4.9 4.7 4.6 13.8*  HGB 14.0 12.6 12.6 12.3  HCT 40.2 35.9* 35.7* 35.0*  MCV 85.7 85.3 84.8 85.0  PLT 100* 117* 127* 694*   Basic Metabolic Panel: Recent Labs  Lab 05/25/21 0328 05/26/21 0405 05/27/21 0524 05/28/21 0551  NA 133* 133* 133* 135  K 3.9 3.8 3.9 4.2  CL 96* 98 99 101  CO2 25 26 23 24   GLUCOSE 146* 146* 169* 207*  BUN 57* 43* 63* 41*  CREATININE 5.08* 4.12* 6.12* 4.16*  CALCIUM 8.5* 8.2* 8.6* 8.7*  PHOS 4.3  --   --   --    GFR: Estimated Creatinine Clearance: 8.9 mL/min (A) (by C-G formula based on SCr of 4.16 mg/dL (H)). Liver Function Tests: Recent Labs  Lab 05/25/21 0328  ALBUMIN 3.4*   No results for input(s): LIPASE, AMYLASE in the last 168 hours. No results for input(s): AMMONIA in  the last 168 hours.  Coagulation Profile: No results for input(s): INR, PROTIME in the last 168 hours. Cardiac Enzymes: No results for input(s): CKTOTAL, CKMB, CKMBINDEX, TROPONINI in the last 168 hours. BNP (last 3 results) No results for input(s): PROBNP in the last 8760 hours. HbA1C: No results for input(s): HGBA1C in the last 72 hours. CBG: Recent Labs  Lab 05/27/21 0746 05/27/21 1142 05/27/21 1706 05/27/21 2117 05/28/21 0720  GLUCAP 154* 174* 109* 138* 198*   Lipid Profile: No results for input(s): CHOL, HDL, LDLCALC, TRIG, CHOLHDL, LDLDIRECT in the last 72 hours. Thyroid Function Tests: No results for input(s): TSH, T4TOTAL, FREET4, T3FREE, THYROIDAB in the last 72 hours. Anemia Panel: No results for input(s): VITAMINB12, FOLATE, FERRITIN, TIBC, IRON, RETICCTPCT in the last 72 hours. Sepsis Labs: No results for input(s): PROCALCITON, LATICACIDVEN in the last 168 hours.  Recent Results (from the past 240 hour(s))  Resp Panel by RT-PCR (Flu A&B, Covid) Nasopharyngeal Swab     Status: None   Collection Time: 05/27/21 12:40 PM   Specimen: Nasopharyngeal Swab; Nasopharyngeal(NP) swabs in vial transport medium  Result Value Ref Range Status   SARS Coronavirus 2 by RT PCR NEGATIVE NEGATIVE Final    Comment: (NOTE) SARS-CoV-2 target nucleic acids are NOT DETECTED.  The SARS-CoV-2 RNA is generally detectable in upper respiratory specimens during the acute phase of infection. The lowest concentration of SARS-CoV-2 viral copies this assay can detect is 138 copies/mL. A negative result does not preclude SARS-Cov-2 infection and should not be used as the sole basis for treatment or other patient management decisions. A negative result may occur with  improper specimen collection/handling, submission of specimen other than nasopharyngeal swab, presence of viral mutation(s) within the areas targeted by this assay, and inadequate number of viral copies(<138 copies/mL). A negative  result must be combined with clinical observations, patient history, and epidemiological information. The expected result is Negative.  Fact Sheet for Patients:  EntrepreneurPulse.com.au  Fact Sheet for Healthcare Providers:  IncredibleEmployment.be  This test is no t yet approved or cleared by the [place] FDA and  has been authorized for detection and/or diagnosis of SARS-CoV-2 by FDA under an Emergency Use Authorization (EUA). This EUA will remain  in effect (meaning this test can be used) for the duration of the COVID-19 declaration under Section 564(b)(1) of the Act, 21 U.S.C.section 360bbb-3(b)(1), unless the authorization is terminated  or revoked sooner.       Influenza A by PCR NEGATIVE NEGATIVE Final   Influenza B by PCR NEGATIVE NEGATIVE Final    Comment: (NOTE) The Xpert Xpress SARS-CoV-2/FLU/RSV plus assay is intended as an aid in the diagnosis of influenza from Nasopharyngeal swab specimens and should not be used as a sole basis for treatment. Nasal washings and aspirates are unacceptable for Xpert  Xpress SARS-CoV-2/FLU/RSV testing.  Fact Sheet for Patients: EntrepreneurPulse.com.au  Fact Sheet for Healthcare Providers: IncredibleEmployment.be  This test is not yet approved or cleared by the [place] FDA and has been authorized for detection and/or diagnosis of SARS-CoV-2 by FDA under an Emergency Use Authorization (EUA). This EUA will remain in effect (meaning this test can be used) for the duration of the COVID-19 declaration under Section 564(b)(1) of the Act, 21 U.S.C. section 360bbb-3(b)(1), unless the authorization is terminated or revoked.  Performed at [facility], [address], [place], [place] [address]           Radiology Studies: No results found.      Scheduled Meds:  apixaban  2.5 mg Oral BID   ascorbic acid  500 mg Oral BID    atorvastatin  80 mg Oral Daily   carvedilol  6.25 mg Oral BID WC   Chlorhexidine Gluconate Cloth  6 each Topical Daily   Chlorhexidine Gluconate Cloth  6 each Topical Q0600   collagenase   Topical Daily   ezetimibe  10 mg Oral Daily   feeding supplement (NEPRO CARB STEADY)  237 mL Oral BID BM   ferrous gluconate  324 mg Oral Q1500   insulin aspart  0-9 Units Subcutaneous TID WC   levETIRAcetam  250 mg Oral Once per day on Mon Wed Fri   levETIRAcetam  500 mg Oral BID   losartan  100 mg Oral QHS   mouth rinse  15 mL Mouth Rinse BID   mirtazapine  7.5 mg Oral QHS   multivitamin  1 tablet Oral QHS   risperiDONE  0.5 mg Oral QHS   Continuous Infusions:   LOS: 11 days    Time spent: 15 mins     Wyvonnia Dusky, MD [facility] Pager [phone]  If 7PM-7AM, please contact night-coverage 05/28/2021, 7:30 AM

## 2021-05-28 NOTE — TOC Progression Note (Signed)
Transition of Care [facility]) - Progression Note    Patient Details  Name: Brianna Dodson MRN: [id number] Date of Birth: Feb 06, 1934  Transition of [facility]) CM/SW Contact  Izola Price, RN Phone Number: 05/28/2021, 12:37 PM  Clinical Narrative:  Contacted sister Augustin Schooling at [phone] to determine if hospital bed ordered 11/25 to be delivered today, 05/28/21, had been delivered. Ms. Richardson Landry indicated that it had not yet been delivered and she was at the house waiting on delivery. This CM asked if she would notify CM around 2 pm today if she had not heard anything or it had not yet been delivered. DC today is pending hospital bed delivery/set up. Will need [facility] transport to home. Barbie Nowell Sites RN CM    315 pm. Hospital Bed still not delivered per sister. Contacted [employer] who will contact logistics as note indicated was to be delivered early in the day. Simmie Davies RN CM      Expected Discharge Plan: [place] Barriers to Discharge: Continued Medical Work up  Expected Discharge Plan and Services Expected Discharge Plan: [place] Acute Care Choice: Resumption of Svcs/PTA Provider Living arrangements for the past 2 months: Single Family Home                                       Social Determinants of Health (SDOH) Interventions    Readmission Risk Interventions Readmission Risk Prevention Plan 05/18/2021 03/03/2021 03/16/2019  Transportation Screening Complete Complete Complete  PCP or Specialist Appt within 5-7 Days - - Complete  PCP or Specialist Appt within 3-5 Days Complete Complete -  Home Care Screening - - Complete  Medication Review (RN CM) - - Complete  HRI or Home Care Consult Complete Complete -  Social Work Consult for [place] Planning/Counseling Complete Not Complete -  SW consult not completed comments - RNCM assigned to patient -  Palliative Care Screening Not Applicable Not Applicable -   Medication Review (RN Care Manager) Complete Complete -  Some recent data might be hidden

## 2021-05-28 NOTE — Progress Notes (Signed)
Physical Therapy Treatment Patient Details Name: Brianna Dodson MRN: [id number] DOB: [birth year] Today's Date: 05/28/2021   History of Present Illness Pt is an 87 y/o F with PMH: Alzheimer's dementia with vascular dementia and multiple strokes including R MCA March 2022 with residual L side weakness and reportedly worsened L side weakess after AV fistual sx on 02/24/21(Dr. Dew), then underwent ligation of the L UE HD site on 8/27 to improve perfusion. Pt presents this admission d/t agitation and altered mental status x3 days with family ultimately bringing pt to ED when pt became agitated at HD tx. Pt adm and tx for Acute encephalopathy (suspected metabolic) with malignant hypertension and acute delerium of unclear etiology.    PT Comments    Pt was long sitting in bed with RN tech in room. She agrees to PT session with encouragement. Pt is alert and conversational however author questions overall cognition throughout. She was able to exit L side of bed with max assist + increased time. Stood 2 x with LUE HHA prior to standing 3 more times to RW. Vcs and assistance throughout. Author elected not to ambulate away from EOB due to pt safety concerns. Pt occasional yells out for different things during session. At conclusion of session, author assisted pt with lunch tray set up. She was independently feeding herself when author left room. Pt would greatly benefit from SNF at DC however per chart review, looks like family may take pt home. Acute PT will continue to follow and progress as able per current POC.   Recommendations for follow up therapy are one component of a multi-disciplinary discharge planning process, led by the attending physician.  Recommendations may be updated based on patient status, additional functional criteria and insurance authorization.  Follow Up Recommendations  Skilled nursing-short term rehab (<3 hours/day)     Assistance Recommended at Discharge Frequent or constant  Supervision/Assistance  Equipment Recommendations  Other (comment);Hospital bed;Wheelchair (measurements PT);Wheelchair cushion (measurements PT);BSC/3in1;Cane (if pt DCs home from acute hospital will need (listed here) + gait belt)       Precautions / Restrictions Precautions Precautions: Fall Restrictions Weight Bearing Restrictions: No     Mobility  Bed Mobility Overal bed mobility: Needs Assistance Bed Mobility: Supine to Sit;Sit to Supine     Supine to sit: Max assist;HOB elevated Sit to supine: Mod assist;HOB elevated   General bed mobility comments: Increased time + max vcs to ecit bed. vcs for step by step sequencing. Pt rolled L to short sit with max assist of one    Transfers Overall transfer level: Needs assistance Equipment used: 1 person hand held assist;Rolling walker (2 wheels) Transfers: Sit to/from Stand Sit to Stand: Min assist;Mod assist;From elevated surface           General transfer comment: pt stood 2 x EOB with LUE HHA and 3 x from EOB to RW. vcs throughout for improved technique. pt unable to progress to taking steps. Was repositioned for lunch at conclusion of session with all containers opened and tray placed in front of her.she was feeding herself when author left room.    Ambulation/Gait      General Gait Details: poor standing at EOB. author elected not to leave EOB for pt safety      Balance Overall balance assessment: Needs assistance Sitting-balance support: Feet supported;Single extremity supported Sitting balance-Leahy Scale: Fair     Standing balance support: Single extremity supported;Bilateral upper extremity supported;Reliant on assistive device for balance;During functional activity Standing balance-Leahy Scale: Poor  Cognition Arousal/Alertness: Awake/alert Behavior During Therapy: WFL for tasks assessed/performed;Impulsive Overall Cognitive Status: History of cognitive impairments - at baseline      General  Comments: Pt is alert and able to communicate her wants however when asked orientation questions, she struggles. oriented x 2. She was agreeable to OOB activity.               Pertinent Vitals/Pain Pain Assessment: No/denies pain     PT Goals (current goals can now be found in the care plan section) Acute Rehab PT Goals Patient Stated Goal: get out of bed Progress towards PT goals: Progressing toward goals    Frequency    Min 2X/week      PT Plan Current plan remains appropriate    Co-evaluation     PT goals addressed during session: Mobility/safety with mobility;Balance;Proper use of DME;Strengthening/ROM        AM-PAC PT "6 Clicks" Mobility   Outcome Measure  Help needed turning from your back to your side while in a flat bed without using bedrails?: A Little Help needed moving from lying on your back to sitting on the side of a flat bed without using bedrails?: A Lot Help needed moving to and from a bed to a chair (including a wheelchair)?: A Lot Help needed standing up from a chair using your arms (e.g., wheelchair or bedside chair)?: A Lot Help needed to walk in hospital room?: Total Help needed climbing 3-5 steps with a railing? : Total 6 Click Score: 11    End of Session Equipment Utilized During Treatment: Gait belt Activity Tolerance: Patient tolerated treatment well Patient left: in bed;with call bell/phone within reach;with bed alarm set;with SCD's reapplied Nurse Communication: Mobility status PT Visit Diagnosis: Unsteadiness on feet (R26.81);Muscle weakness (generalized) (M62.81);History of falling (Z91.81);Difficulty in walking, not elsewhere classified (R26.2);Hemiplegia and hemiparesis Hemiplegia - Right/Left: Left Hemiplegia - dominant/non-dominant: Non-dominant     Time: 0277-4128 PT Time Calculation (min) (ACUTE ONLY): 23 min  Charges:  $Therapeutic Activity: 23-37 mins                    Julaine Fusi PTA 05/28/21, 12:43 PM

## 2021-05-28 NOTE — Progress Notes (Signed)
[facility]  ROUNDING NOTE   Subjective:   Patient seen laying in bed Alert but drowsy Breakfast tray at bedside Received dialysis yesterday, seated in chair Tolerated well   Objective:  Vital signs in last 24 hours:  Temp:  [97.6 F (36.4 C)-98.2 F (36.8 C)] 98.2 F (36.8 C) (11/26 0700) Pulse Rate:  [66-75] 68 (11/26 0700) Resp:  [15-24] 16 (11/26 0700) BP: (133-159)/(65-81) 136/72 (11/26 0700) SpO2:  [99 %-100 %] 100 % (11/26 0700) Weight:  [57.4 kg] 57.4 kg (11/25 1628)  Weight change:  Filed Weights   05/18/21 1545 05/27/21 1628  Weight: 60.4 kg 57.4 kg    Intake/Output: I/O last 3 completed shifts: In: 720 [P.O.:720] Out: 426 [Other:426]   Intake/Output this shift:  No intake/output data recorded.  Physical Exam: General: Laying in the bed, no acute distress  Head: Normocephalic, atraumatic. Moist oral mucosal membranes  Lungs:  Clear to auscultation, normal effort  Heart: No rub or gallop  Abdomen:  Soft, nontender, nondistended  Extremities:  No peripheral edema.  Neurologic: Alert to self and place,  Left sided weakness.   Skin: No lesions  Access: Right IJ permcath    Basic Metabolic Panel: Recent Labs  Lab 05/25/21 0328 05/26/21 0405 05/27/21 0524 05/28/21 0551  NA 133* 133* 133* 135  K 3.9 3.8 3.9 4.2  CL 96* 98 99 101  CO2 25 26 23 24   GLUCOSE 146* 146* 169* 207*  BUN 57* 43* 63* 41*  CREATININE 5.08* 4.12* 6.12* 4.16*  CALCIUM 8.5* 8.2* 8.6* 8.7*  PHOS 4.3  --   --   --      Liver Function Tests: Recent Labs  Lab 05/25/21 0328  ALBUMIN 3.4*    No results for input(s): LIPASE, AMYLASE in the last 168 hours. No results for input(s): AMMONIA in the last 168 hours.   CBC: Recent Labs  Lab 05/25/21 0328 05/26/21 0405 05/27/21 0524 05/28/21 0551  WBC 4.9 4.7 4.6 13.8*  HGB 14.0 12.6 12.6 12.3  HCT 40.2 35.9* 35.7* 35.0*  MCV 85.7 85.3 84.8 85.0  PLT 100* 117* 127* 122*     Cardiac Enzymes: No  results for input(s): CKTOTAL, CKMB, CKMBINDEX, TROPONINI in the last 168 hours.  BNP: Invalid input(s): POCBNP  CBG: Recent Labs  Lab 05/27/21 0746 05/27/21 1142 05/27/21 1706 05/27/21 2117 05/28/21 0720  GLUCAP 154* 174* 109* 138* 198*     Microbiology: Results for orders placed or performed during the hospital encounter of 05/16/21  Resp Panel by RT-PCR (Flu A&B, Covid) Nasopharyngeal Swab     Status: None   Collection Time: 05/16/21 10:07 PM   Specimen: Nasopharyngeal Swab; Nasopharyngeal(NP) swabs in vial transport medium  Result Value Ref Range Status   SARS Coronavirus 2 by RT PCR NEGATIVE NEGATIVE Final    Comment: (NOTE) SARS-CoV-2 target nucleic acids are NOT DETECTED.  The SARS-CoV-2 RNA is generally detectable in upper respiratory specimens during the acute phase of infection. The lowest concentration of SARS-CoV-2 viral copies this assay can detect is 138 copies/mL. A negative result does not preclude SARS-Cov-2 infection and should not be used as the sole basis for treatment or other patient management decisions. A negative result may occur with  improper specimen collection/handling, submission of specimen other than nasopharyngeal swab, presence of viral mutation(s) within the areas targeted by this assay, and inadequate number of viral copies(<138 copies/mL). A negative result must be combined with clinical observations, patient history, and epidemiological information. The expected result is  Negative.  Fact Sheet for Patients:  EntrepreneurPulse.com.au  Fact Sheet for Healthcare Providers:  IncredibleEmployment.be  This test is no t yet approved or cleared by the [place] FDA and  has been authorized for detection and/or diagnosis of SARS-CoV-2 by FDA under an Emergency Use Authorization (EUA). This EUA will remain  in effect (meaning this test can be used) for the duration of the COVID-19 declaration under  Section 564(b)(1) of the Act, 21 U.S.C.section 360bbb-3(b)(1), unless the authorization is terminated  or revoked sooner.       Influenza A by PCR NEGATIVE NEGATIVE Final   Influenza B by PCR NEGATIVE NEGATIVE Final    Comment: (NOTE) The Xpert Xpress SARS-CoV-2/FLU/RSV plus assay is intended as an aid in the diagnosis of influenza from Nasopharyngeal swab specimens and should not be used as a sole basis for treatment. Nasal washings and aspirates are unacceptable for Xpert Xpress SARS-CoV-2/FLU/RSV testing.  Fact Sheet for Patients: EntrepreneurPulse.com.au  Fact Sheet for Healthcare Providers: IncredibleEmployment.be  This test is not yet approved or cleared by the [place] FDA and has been authorized for detection and/or diagnosis of SARS-CoV-2 by FDA under an Emergency Use Authorization (EUA). This EUA will remain in effect (meaning this test can be used) for the duration of the COVID-19 declaration under Section 564(b)(1) of the Act, 21 U.S.C. section 360bbb-3(b)(1), unless the authorization is terminated or revoked.  Performed at [facility], [address] [place], [place] [address]   Urine Culture     Status: Abnormal   Collection Time: 05/16/21 10:07 PM   Specimen: In/Out Cath Urine  Result Value Ref Range Status   Specimen Description   Final    IN/OUT CATH URINE Performed at [facility], [address] [place], [place] [address]    Special Requests   Final    NONE Performed at [facility], [address] [place], [place] [address]    Culture (A)  Final    >=100,000 COLONIES/mL GARDNERELLA VAGINALIS Standardized susceptibility testing for this organism is not available. 80,000 COLONIES/mL DIPHTHEROIDS(CORYNEBACTERIUM SPECIES)    Report Status 05/19/2021 FINAL  Final  Resp Panel by RT-PCR (Flu A&B, Covid) Nasopharyngeal Swab     Status: None   Collection Time: 05/27/21 12:40 PM    Specimen: Nasopharyngeal Swab; Nasopharyngeal(NP) swabs in vial transport medium  Result Value Ref Range Status   SARS Coronavirus 2 by RT PCR NEGATIVE NEGATIVE Final    Comment: (NOTE) SARS-CoV-2 target nucleic acids are NOT DETECTED.  The SARS-CoV-2 RNA is generally detectable in upper respiratory specimens during the acute phase of infection. The lowest concentration of SARS-CoV-2 viral copies this assay can detect is 138 copies/mL. A negative result does not preclude SARS-Cov-2 infection and should not be used as the sole basis for treatment or other patient management decisions. A negative result may occur with  improper specimen collection/handling, submission of specimen other than nasopharyngeal swab, presence of viral mutation(s) within the areas targeted by this assay, and inadequate number of viral copies(<138 copies/mL). A negative result must be combined with clinical observations, patient history, and epidemiological information. The expected result is Negative.  Fact Sheet for Patients:  EntrepreneurPulse.com.au  Fact Sheet for Healthcare Providers:  IncredibleEmployment.be  This test is no t yet approved or cleared by the [place] FDA and  has been authorized for detection and/or diagnosis of SARS-CoV-2 by FDA under an Emergency Use Authorization (EUA). This EUA will remain  in effect (meaning this test can be  used) for the duration of the COVID-19 declaration under Section 564(b)(1) of the Act, 21 U.S.C.section 360bbb-3(b)(1), unless the authorization is terminated  or revoked sooner.       Influenza A by PCR NEGATIVE NEGATIVE Final   Influenza B by PCR NEGATIVE NEGATIVE Final    Comment: (NOTE) The Xpert Xpress SARS-CoV-2/FLU/RSV plus assay is intended as an aid in the diagnosis of influenza from Nasopharyngeal swab specimens and should not be used as a sole basis for treatment. Nasal washings and aspirates are  unacceptable for Xpert Xpress SARS-CoV-2/FLU/RSV testing.  Fact Sheet for Patients: EntrepreneurPulse.com.au  Fact Sheet for Healthcare Providers: IncredibleEmployment.be  This test is not yet approved or cleared by the [place] FDA and has been authorized for detection and/or diagnosis of SARS-CoV-2 by FDA under an Emergency Use Authorization (EUA). This EUA will remain in effect (meaning this test can be used) for the duration of the COVID-19 declaration under Section 564(b)(1) of the Act, 21 U.S.C. section 360bbb-3(b)(1), unless the authorization is terminated or revoked.  Performed at [facility], [address] [place],  [address]     Coagulation Studies: No results for input(s): LABPROT, INR in the last 72 hours.  Urinalysis: No results for input(s): COLORURINE, LABSPEC, PHURINE, GLUCOSEU, HGBUR, BILIRUBINUR, KETONESUR, PROTEINUR, UROBILINOGEN, NITRITE, LEUKOCYTESUR in the last 72 hours.  Invalid input(s): APPERANCEUR     Imaging: No results found.   Medications:      apixaban  2.5 mg Oral BID   ascorbic acid  500 mg Oral BID   atorvastatin  80 mg Oral Daily   carvedilol  6.25 mg Oral BID WC   Chlorhexidine Gluconate Cloth  6 each Topical Daily   Chlorhexidine Gluconate Cloth  6 each Topical Q0600   collagenase   Topical Daily   ezetimibe  10 mg Oral Daily   feeding supplement (NEPRO CARB STEADY)  237 mL Oral BID BM   ferrous gluconate  324 mg Oral Q1500   insulin aspart  0-9 Units Subcutaneous TID WC   levETIRAcetam  250 mg Oral Once per day on Mon Wed Fri   levETIRAcetam  500 mg Oral BID   losartan  100 mg Oral QHS   mouth rinse  15 mL Mouth Rinse BID   mirtazapine  7.5 mg Oral QHS   multivitamin  1 tablet Oral QHS   risperiDONE  0.5 mg Oral QHS   acetaminophen **OR** acetaminophen, haloperidol lactate, labetalol, ondansetron **OR** ondansetron (ZOFRAN) IV, risperiDONE,  senna-docusate  Assessment/ Plan:  Brianna Dodson is a 87 y.o. black female with end stage renal disease on hemodialysis, hypertension, CVA, vascular dementia, hyperlipidemia, diabetes mellitus type II, atrial fibrillation who is admitted to [facility] on 05/16/2021 for Confusion [R41.0] Agitation [R45.1]   [facility] M[facility] [address] RIJ permcath 65.5kg   End Stage Renal Disease requiring hemodialysis:  -Received dialysis yesterday seated in chair.  Tolerated well.  UF goal 0.5 L achieved. -Next treatment scheduled for Monday   Altered mental status: Patient with history of chronic right MCA stroke and chronic atrophy.  Abnormal EEG. - Levetiracetam at neurology recommendations of 500mg  twice daily with additional 250 mg dose after dialysis. -Mentation continues to fluctuate, presents pleasantly today   Hypertension: with hypertension urgency on admission. Home regimen includes losartan, amlodipine, carvedilol, hydralazine.   - IV labatelol PRN  -Managed with losartan, carvedilol - BP stable for this patient   Anemia of chronic kidney disease:  Lab Results  Component Value Date  HGB 12.3 05/28/2021  Hemoglobin at target, will continue to monitor   Secondary Hyperparathyroidism:  Lab Results  Component Value Date   PTH 83 (H) 01/06/2016   CALCIUM 8.7 (L) 05/28/2021   CAION 1.04 (L) 02/24/2021   PHOS 4.3 05/25/2021     -Phosphorus at goal at this time.  Calcium slightly decreased   LOS: 11   11/26/202211:00 AM

## 2021-05-29 ENCOUNTER — Encounter: Payer: Self-pay | Admitting: Internal Medicine

## 2021-05-29 ENCOUNTER — Other Ambulatory Visit: Payer: Self-pay

## 2021-05-29 DIAGNOSIS — D696 Thrombocytopenia, unspecified: Secondary | ICD-10-CM | POA: Diagnosis not present

## 2021-05-29 DIAGNOSIS — N186 End stage renal disease: Secondary | ICD-10-CM | POA: Diagnosis not present

## 2021-05-29 DIAGNOSIS — G309 Alzheimer's disease, unspecified: Secondary | ICD-10-CM | POA: Diagnosis not present

## 2021-05-29 DIAGNOSIS — Z992 Dependence on renal dialysis: Secondary | ICD-10-CM | POA: Diagnosis not present

## 2021-05-29 LAB — CBC
HCT: 34.3 % — ABNORMAL LOW (ref 36.0–46.0)
Hemoglobin: 11.8 g/dL — ABNORMAL LOW (ref 12.0–15.0)
MCH: 29 pg (ref 26.0–34.0)
MCHC: 34.4 g/dL (ref 30.0–36.0)
MCV: 84.3 fL (ref 80.0–100.0)
Platelets: 123 10*3/uL — ABNORMAL LOW (ref 150–400)
RBC: 4.07 MIL/uL (ref 3.87–5.11)
RDW: 13.1 % (ref 11.5–15.5)
WBC: 9.8 10*3/uL (ref 4.0–10.5)
nRBC: 0 % (ref 0.0–0.2)

## 2021-05-29 LAB — BASIC METABOLIC PANEL
Anion gap: 11 (ref 5–15)
BUN: 60 mg/dL — ABNORMAL HIGH (ref 8–23)
CO2: 23 mmol/L (ref 22–32)
Calcium: 8.5 mg/dL — ABNORMAL LOW (ref 8.9–10.3)
Chloride: 99 mmol/L (ref 98–111)
Creatinine, Ser: 5.6 mg/dL — ABNORMAL HIGH (ref 0.44–1.00)
GFR, Estimated: 7 mL/min — ABNORMAL LOW (ref >60)
Glucose, Bld: 192 mg/dL — ABNORMAL HIGH (ref 70–99)
Potassium: 4.1 mmol/L (ref 3.5–5.1)
Sodium: 133 mmol/L — ABNORMAL LOW (ref 135–145)

## 2021-05-29 LAB — GLUCOSE, CAPILLARY
Glucose-Capillary: 172 mg/dL — ABNORMAL HIGH (ref 70–99)
Glucose-Capillary: 156 mg/dL — ABNORMAL HIGH (ref 70–99)
Glucose-Capillary: 189 mg/dL — ABNORMAL HIGH (ref 70–99)
Glucose-Capillary: 222 mg/dL — ABNORMAL HIGH (ref 70–99)

## 2021-05-29 NOTE — Progress Notes (Signed)
[facility]  ROUNDING NOTE   Subjective:   Patient seen resting in bed, breakfast tray at bedside States she is not hungry Denies nausea, vomiting, shortness of breath  Awaiting discharge planning   Objective:  Vital signs in last 24 hours:  Temp:  [97.6 F (36.4 C)-98.7 F (37.1 C)] 98.7 F (37.1 C) (11/27 0729) Pulse Rate:  [63-78] 71 (11/27 0729) Resp:  [16-18] 18 (11/27 0729) BP: (125-160)/(55-70) 149/63 (11/27 0729) SpO2:  [99 %-100 %] 100 % (11/27 0729)  Weight change:  Filed Weights   05/18/21 1545 05/27/21 1628  Weight: 60.4 kg 57.4 kg    Intake/Output: I/O last 3 completed shifts: In: 360 [P.O.:360] Out: -    Intake/Output this shift:  Total I/O In: 120 [P.O.:120] Out: -   Physical Exam: General: Laying in the bed, no acute distress  Head: Normocephalic, atraumatic. Moist oral mucosal membranes  Lungs:  Clear to auscultation, normal effort  Heart: No rub or gallop  Abdomen:  Soft, nontender, nondistended  Extremities:  No peripheral edema.  Neurologic: Alert to self and place,  Left sided weakness.   Skin: No lesions  Access: Right IJ permcath    Basic Metabolic Panel: Recent Labs  Lab 05/25/21 0328 05/26/21 0405 05/27/21 0524 05/28/21 0551 05/29/21 0623  NA 133* 133* 133* 135 133*  K 3.9 3.8 3.9 4.2 4.1  CL 96* 98 99 101 99  CO2 25 26 23 24 23   GLUCOSE 146* 146* 169* 207* 192*  BUN 57* 43* 63* 41* 60*  CREATININE 5.08* 4.12* 6.12* 4.16* 5.60*  CALCIUM 8.5* 8.2* 8.6* 8.7* 8.5*  PHOS 4.3  --   --   --   --      Liver Function Tests: Recent Labs  Lab 05/25/21 0328  ALBUMIN 3.4*    No results for input(s): LIPASE, AMYLASE in the last 168 hours. No results for input(s): AMMONIA in the last 168 hours.   CBC: Recent Labs  Lab 05/25/21 0328 05/26/21 0405 05/27/21 0524 05/28/21 0551 05/29/21 0623  WBC 4.9 4.7 4.6 13.8* 9.8  HGB 14.0 12.6 12.6 12.3 11.8*  HCT 40.2 35.9* 35.7* 35.0* 34.3*  MCV 85.7 85.3 84.8  85.0 84.3  PLT 100* 117* 127* 122* 123*     Cardiac Enzymes: No results for input(s): CKTOTAL, CKMB, CKMBINDEX, TROPONINI in the last 168 hours.  BNP: Invalid input(s): POCBNP  CBG: Recent Labs  Lab 05/28/21 1204 05/28/21 1558 05/28/21 2142 05/29/21 0845 05/29/21 1214  GLUCAP 130* 174* 224* 172* 156*     Microbiology: Results for orders placed or performed during the hospital encounter of 05/16/21  Resp Panel by RT-PCR (Flu A&B, Covid) Nasopharyngeal Swab     Status: None   Collection Time: 05/16/21 10:07 PM   Specimen: Nasopharyngeal Swab; Nasopharyngeal(NP) swabs in vial transport medium  Result Value Ref Range Status   SARS Coronavirus 2 by RT PCR NEGATIVE NEGATIVE Final    Comment: (NOTE) SARS-CoV-2 target nucleic acids are NOT DETECTED.  The SARS-CoV-2 RNA is generally detectable in upper respiratory specimens during the acute phase of infection. The lowest concentration of SARS-CoV-2 viral copies this assay can detect is 138 copies/mL. A negative result does not preclude SARS-Cov-2 infection and should not be used as the sole basis for treatment or other patient management decisions. A negative result may occur with  improper specimen collection/handling, submission of specimen other than nasopharyngeal swab, presence of viral mutation(s) within the areas targeted by this assay, and inadequate number of viral  copies(<138 copies/mL). A negative result must be combined with clinical observations, patient history, and epidemiological information. The expected result is Negative.  Fact Sheet for Patients:  EntrepreneurPulse.com.au  Fact Sheet for Healthcare Providers:  IncredibleEmployment.be  This test is no t yet approved or cleared by the [place] FDA and  has been authorized for detection and/or diagnosis of SARS-CoV-2 by FDA under an Emergency Use Authorization (EUA). This EUA will remain  in effect (meaning this  test can be used) for the duration of the COVID-19 declaration under Section 564(b)(1) of the Act, 21 U.S.C.section 360bbb-3(b)(1), unless the authorization is terminated  or revoked sooner.       Influenza A by PCR NEGATIVE NEGATIVE Final   Influenza B by PCR NEGATIVE NEGATIVE Final    Comment: (NOTE) The Xpert Xpress SARS-CoV-2/FLU/RSV plus assay is intended as an aid in the diagnosis of influenza from Nasopharyngeal swab specimens and should not be used as a sole basis for treatment. Nasal washings and aspirates are unacceptable for Xpert Xpress SARS-CoV-2/FLU/RSV testing.  Fact Sheet for Patients: EntrepreneurPulse.com.au  Fact Sheet for Healthcare Providers: IncredibleEmployment.be  This test is not yet approved or cleared by the [place] FDA and has been authorized for detection and/or diagnosis of SARS-CoV-2 by FDA under an Emergency Use Authorization (EUA). This EUA will remain in effect (meaning this test can be used) for the duration of the COVID-19 declaration under Section 564(b)(1) of the Act, 21 U.S.C. section 360bbb-3(b)(1), unless the authorization is terminated or revoked.  Performed at [facility], [address], [place], [place] [address]   Urine Culture     Status: Abnormal   Collection Time: 05/16/21 10:07 PM   Specimen: In/Out Cath Urine  Result Value Ref Range Status   Specimen Description   Final    IN/OUT CATH URINE Performed at [facility], [address], [place], [place] [address]    Special Requests   Final    NONE Performed at [facility], [address] [place], [place] [address]    Culture (A)  Final    >=100,000 COLONIES/mL GARDNERELLA VAGINALIS Standardized susceptibility testing for this organism is not available. 80,000 COLONIES/mL DIPHTHEROIDS(CORYNEBACTERIUM SPECIES)    Report Status 05/19/2021 FINAL  Final  Resp Panel by RT-PCR (Flu A&B, Covid)  Nasopharyngeal Swab     Status: None   Collection Time: 05/27/21 12:40 PM   Specimen: Nasopharyngeal Swab; Nasopharyngeal(NP) swabs in vial transport medium  Result Value Ref Range Status   SARS Coronavirus 2 by RT PCR NEGATIVE NEGATIVE Final    Comment: (NOTE) SARS-CoV-2 target nucleic acids are NOT DETECTED.  The SARS-CoV-2 RNA is generally detectable in upper respiratory specimens during the acute phase of infection. The lowest concentration of SARS-CoV-2 viral copies this assay can detect is 138 copies/mL. A negative result does not preclude SARS-Cov-2 infection and should not be used as the sole basis for treatment or other patient management decisions. A negative result may occur with  improper specimen collection/handling, submission of specimen other than nasopharyngeal swab, presence of viral mutation(s) within the areas targeted by this assay, and inadequate number of viral copies(<138 copies/mL). A negative result must be combined with clinical observations, patient history, and epidemiological information. The expected result is Negative.  Fact Sheet for Patients:  EntrepreneurPulse.com.au  Fact Sheet for Healthcare Providers:  IncredibleEmployment.be  This test is no t yet approved or cleared by the [place] FDA and  has been authorized for detection and/or diagnosis of SARS-CoV-2  by FDA under an Emergency Use Authorization (EUA). This EUA will remain  in effect (meaning this test can be used) for the duration of the COVID-19 declaration under Section 564(b)(1) of the Act, 21 U.S.C.section 360bbb-3(b)(1), unless the authorization is terminated  or revoked sooner.       Influenza A by PCR NEGATIVE NEGATIVE Final   Influenza B by PCR NEGATIVE NEGATIVE Final    Comment: (NOTE) The Xpert Xpress SARS-CoV-2/FLU/RSV plus assay is intended as an aid in the diagnosis of influenza from Nasopharyngeal swab specimens and should not be  used as a sole basis for treatment. Nasal washings and aspirates are unacceptable for Xpert Xpress SARS-CoV-2/FLU/RSV testing.  Fact Sheet for Patients: EntrepreneurPulse.com.au  Fact Sheet for Healthcare Providers: IncredibleEmployment.be  This test is not yet approved or cleared by the [place] FDA and has been authorized for detection and/or diagnosis of SARS-CoV-2 by FDA under an Emergency Use Authorization (EUA). This EUA will remain in effect (meaning this test can be used) for the duration of the COVID-19 declaration under Section 564(b)(1) of the Act, 21 U.S.C. section 360bbb-3(b)(1), unless the authorization is terminated or revoked.  Performed at [facility], [address] [place], [place] [address]     Coagulation Studies: No results for input(s): LABPROT, INR in the last 72 hours.  Urinalysis: No results for input(s): COLORURINE, LABSPEC, PHURINE, GLUCOSEU, HGBUR, BILIRUBINUR, KETONESUR, PROTEINUR, UROBILINOGEN, NITRITE, LEUKOCYTESUR in the last 72 hours.  Invalid input(s): APPERANCEUR     Imaging: No results found.   Medications:      apixaban  2.5 mg Oral BID   ascorbic acid  500 mg Oral BID   atorvastatin  80 mg Oral Daily   carvedilol  6.25 mg Oral BID WC   Chlorhexidine Gluconate Cloth  6 each Topical Daily   Chlorhexidine Gluconate Cloth  6 each Topical Q0600   collagenase   Topical Daily   ezetimibe  10 mg Oral Daily   feeding supplement (NEPRO CARB STEADY)  237 mL Oral BID BM   ferrous gluconate  324 mg Oral Q1500   insulin aspart  0-9 Units Subcutaneous TID WC   levETIRAcetam  250 mg Oral Once per day on Mon Wed Fri   levETIRAcetam  500 mg Oral BID   losartan  100 mg Oral QHS   mouth rinse  15 mL Mouth Rinse BID   mirtazapine  7.5 mg Oral QHS   multivitamin  1 tablet Oral QHS   risperiDONE  0.5 mg Oral QHS   acetaminophen **OR** acetaminophen, haloperidol lactate, labetalol,  ondansetron **OR** ondansetron (ZOFRAN) IV, risperiDONE, senna-docusate  Assessment/ Plan:  [name] is a 87 y.o. black female with end stage renal disease on hemodialysis, hypertension, CVA, vascular dementia, hyperlipidemia, diabetes mellitus type II, atrial fibrillation who is admitted to [facility] on 05/16/2021 for Confusion [R41.0] Agitation [R45.1]   [facility] MWF [facility] [address] RIJ permcath 65.5kg   End Stage Renal Disease requiring hemodialysis:  -Next treatment scheduled for Monday   Altered mental status: Patient with history of chronic right MCA stroke and chronic atrophy.  Abnormal EEG. Levetiracetam at neurology recommendations of 500mg  twice daily with additional 250 mg dose after dialysis.    Hypertension: with hypertension urgency on admission. Home regimen includes losartan, amlodipine, carvedilol, hydralazine.   - IV labatelol PRN  -Managed with losartan, carvedilol    Anemia of chronic kidney disease:  Lab Results  Component Value Date   HGB 11.8 (L) 05/29/2021   Will  continue to monitor   Secondary Hyperparathyroidism:  Lab Results  Component Value Date   PTH 83 (H) 01/06/2016   CALCIUM 8.5 (L) 05/29/2021   CAION 1.04 (L) 02/24/2021   PHOS 4.3 05/25/2021     -Will monitor bone minerals during this admission   LOS: [place] 11/27/20222:02 PM

## 2021-05-29 NOTE — TOC Progression Note (Signed)
Transition of Care [facility]) - Progression Note    Patient Details  Name: Brianna Dodson MRN: [id number] Date of Birth: 1933/12/10  Transition of Care [facility]) CM/SW Contact  Izola Price, RN Phone Number: 05/29/2021, 3:00 PM  Clinical Narrative: 11/27: CM contacted Sister Augustin Schooling and bed was delivered late yesterday. Ms. Virgia Land was concerned that patient would have to miss HD Monday if came home today as she was told to call back Monday to arrange transportation due to [place]. Ms Virgia Land discussed that patient lives alone, sister lives close by but not in same residence. Did decline [facility] for [facility].  She is also meeting with [facility] home care or additional private help outside of [facility] services ordered on discharge.  Patient will need EMS transport to home. [address] Pt. Was in service with [facility] and will accept patient back on discharge for PT, OT, RN.  Provider updated via secure chat.   Sister and [name] representative expressed some concerns about patient being in residence alone. Sister hopeful Stacie Glaze can provide gap coverage. TOC to follow up Monday.   Simmie Davies RN CM     Expected Discharge Plan: [place] Barriers to Discharge: Equipment Delay [facility] bed was not delived on 11/26 as arranged on 05/27/21. [facility] was contacted twice on 11/26 and it was to be prioritized as patient's needed EMS transport to home.)  Expected Discharge Plan and Services Expected Discharge Plan: [place] Acute Care Choice: Resumption of Svcs/PTA Provider Living arrangements for the past 2 months: Single Family Home                                       Social Determinants of Health (SDOH) Interventions    Readmission Risk Interventions Readmission Risk Prevention Plan 05/18/2021 03/03/2021 03/16/2019  Transportation Screening Complete Complete Complete  PCP or Specialist Appt within 5-7 Days - - Complete  PCP or Specialist Appt  within 3-5 Days Complete Complete -  Home Care Screening - - Complete  Medication Review (RN CM) - - Complete  HRI or Home Care Consult Complete Complete -  Social Work Consult for [place] Planning/Counseling Complete Not Complete -  SW consult not completed comments - RNCM assigned to patient -  Palliative Care Screening Not Applicable Not Applicable -  Medication Review (RN Care Manager) Complete Complete -  Some recent data might be hidden

## 2021-05-29 NOTE — TOC Progression Note (Signed)
Transition of Care [facility]) - Progression Note    Patient Details  Name: Brianna Dodson MRN: [id number] Date of Birth: 1934-06-10  Transition of Care [facility]) CM/SW Contact  Izola Price, RN Phone Number: 05/29/2021, 10:36 AM  Clinical Narrative: Patient remains in hospital this am due to hospital bed not being delivered yesterday evening. Will follow up with [facility] again this am. Simmie Davies RN CM       Expected Discharge Plan: [place] Barriers to Discharge: Continued Medical Work up  Expected Discharge Plan and Services Expected Discharge Plan: [place] Acute Care Choice: Resumption of Svcs/PTA Provider Living arrangements for the past 2 months: Single Family Home                                       Social Determinants of Health (SDOH) Interventions    Readmission Risk Interventions Readmission Risk Prevention Plan 05/18/2021 03/03/2021 03/16/2019  Transportation Screening Complete Complete Complete  PCP or Specialist Appt within 5-7 Days - - Complete  PCP or Specialist Appt within 3-5 Days Complete Complete -  Home Care Screening - - Complete  Medication Review (RN CM) - - Complete  HRI or Home Care Consult Complete Complete -  Social Work Consult for [place] Planning/Counseling Complete Not Complete -  SW consult not completed comments - RNCM assigned to patient -  Palliative Care Screening Not Applicable Not Applicable -  Medication Review (RN Care Manager) Complete Complete -  Some recent data might be hidden

## 2021-05-29 NOTE — Progress Notes (Signed)
PROGRESS NOTE    Brianna Dodson  HGD:[id number] DOB: 1933/08/28 DOA: 05/16/2021 PCP: [name], MD    Assessment & Plan:   Principal Problem:   Agitation Active Problems:   Essential hypertension   Type II diabetes mellitus with renal manifestations ([place])   Atrial fibrillation (HCC)   Mixed Alzheimer's and vascular dementia ([place])   End stage renal disease on dialysis ([place])   Recurrent strokes (HCC)   Thrombocytopenia ([place])   Chronic anticoagulation   Pressure injury of skin   Hypertensive urgency   Post-ictal state ([place])  Acute encephalopathy:  suspected metabolic with malignant hypertension, Alzheimer's & vascular dementia. CT head shows no acute intracranial abnormality but chronic right MCA infarct & chronic atrophy. EEG suggestive of cortical dysfunction arising from left frontotemporal region, nonspecific etiology but could be secondary to underlying structural abnormality, post-ictal state.  No seizures or epileptiform discharges were seen throughout the recording. Continue on keppra, remeron, & risperdal. Haldol prn. Neuro & psych recs are apprec   ESRD: on HD MWF. Management as per nephro   Thrombocytopenia: etiology unclear, labile    DM2: well controlled, HbA1c 5.5. Diet controlled    Hx of CVA:  with vascular dementia. MRI of the brain shows no acute CVA, chronic CVAs   HLD:  continue on statin, zetia    Malignant HTN: resolved but still w/ HTN. Continue on coreg, losartan    DVT prophylaxis: eliquis  Code Status: full  Family Communication:   Disposition Plan: medically stable d/c to home w/ family care but equipment needs to be delivered to pt's home prior to d/c.  Level of care: Med-Surg  Status is: Inpatient  Remains inpatient appropriate because: Medically stable. Still waiting on DME equipment to be delivered to pt's house    Consultants:  Nephro   Procedures:   Antimicrobials:   Subjective: Pt c/o fatigue   Objective: Vitals:    05/28/21 1700 05/28/21 1933 05/29/21 0522 05/29/21 0729  BP: 132/70 (!) 125/55 (!) 160/69 (!) 149/63  Pulse: 78 63 76 71  Resp: 16 16 16 18   Temp: 97.6 F (36.4 C) 97.6 F (36.4 C) 97.7 F (36.5 C) 98.7 F (37.1 C)  TempSrc: Oral Oral Oral   SpO2: 100% 100% 99% 100%  Weight:      Height:        Intake/Output Summary (Last 24 hours) at 05/29/2021 0753 Last data filed at 05/28/2021 1800 Gross per 24 hour  Intake 360 ml  Output --  Net 360 ml   Filed Weights   05/18/21 1545 05/27/21 1628  Weight: 60.4 kg 57.4 kg    Examination:  General exam: Appears agitated & frustrated  Respiratory system: clear breath sounds b/l  Cardiovascular system: S1 & S2+. No rubs or gallops  Gastrointestinal system: Abd is soft, NT, ND & hypoactive bowel sounds Central nervous system: Alert and awake. Moves all extremities  Psychiatry: judgement and insight appears poor. Agitated and frustrated     Data Reviewed: I have personally reviewed following labs and imaging studies  CBC: Recent Labs  Lab 05/25/21 0328 05/26/21 0405 05/27/21 0524 05/28/21 0551 05/29/21 0623  WBC 4.9 4.7 4.6 13.8* 9.8  HGB 14.0 12.6 12.6 12.3 11.8*  HCT 40.2 35.9* 35.7* 35.0* 34.3*  MCV 85.7 85.3 84.8 85.0 84.3  PLT 100* 117* 127* 122* 962*   Basic Metabolic Panel: Recent Labs  Lab 05/25/21 0328 05/26/21 0405 05/27/21 0524 05/28/21 0551 05/29/21 0623  NA 133* 133* 133* 135 133*  K 3.9 3.8 3.9 4.2 4.1  CL 96* 98 99 101 99  CO2 25 26 23 24 23   GLUCOSE 146* 146* 169* 207* 192*  BUN 57* 43* 63* 41* 60*  CREATININE 5.08* 4.12* 6.12* 4.16* 5.60*  CALCIUM 8.5* 8.2* 8.6* 8.7* 8.5*  PHOS 4.3  --   --   --   --    GFR: Estimated Creatinine Clearance: 6.6 mL/min (A) (by C-G formula based on SCr of 5.6 mg/dL (H)). Liver Function Tests: Recent Labs  Lab 05/25/21 0328  ALBUMIN 3.4*   No results for input(s): LIPASE, AMYLASE in the last 168 hours. No results for input(s): AMMONIA in the last 168  hours.  Coagulation Profile: No results for input(s): INR, PROTIME in the last 168 hours. Cardiac Enzymes: No results for input(s): CKTOTAL, CKMB, CKMBINDEX, TROPONINI in the last 168 hours. BNP (last 3 results) No results for input(s): PROBNP in the last 8760 hours. HbA1C: No results for input(s): HGBA1C in the last 72 hours. CBG: Recent Labs  Lab 05/27/21 2117 05/28/21 0720 05/28/21 1204 05/28/21 1558 05/28/21 2142  GLUCAP 138* 198* 130* 174* 224*   Lipid Profile: No results for input(s): CHOL, HDL, LDLCALC, TRIG, CHOLHDL, LDLDIRECT in the last 72 hours. Thyroid Function Tests: No results for input(s): TSH, T4TOTAL, FREET4, T3FREE, THYROIDAB in the last 72 hours. Anemia Panel: No results for input(s): VITAMINB12, FOLATE, FERRITIN, TIBC, IRON, RETICCTPCT in the last 72 hours. Sepsis Labs: No results for input(s): PROCALCITON, LATICACIDVEN in the last 168 hours.  Recent Results (from the past 240 hour(s))  Resp Panel by RT-PCR (Flu A&B, Covid) Nasopharyngeal Swab     Status: None   Collection Time: 05/27/21 12:40 PM   Specimen: Nasopharyngeal Swab; Nasopharyngeal(NP) swabs in vial transport medium  Result Value Ref Range Status   SARS Coronavirus 2 by RT PCR NEGATIVE NEGATIVE Final    Comment: (NOTE) SARS-CoV-2 target nucleic acids are NOT DETECTED.  The SARS-CoV-2 RNA is generally detectable in upper respiratory specimens during the acute phase of infection. The lowest concentration of SARS-CoV-2 viral copies this assay can detect is 138 copies/mL. A negative result does not preclude SARS-Cov-2 infection and should not be used as the sole basis for treatment or other patient management decisions. A negative result may occur with  improper specimen collection/handling, submission of specimen other than nasopharyngeal swab, presence of viral mutation(s) within the areas targeted by this assay, and inadequate number of viral copies(<138 copies/mL). A negative result must  be combined with clinical observations, patient history, and epidemiological information. The expected result is Negative.  Fact Sheet for Patients:  EntrepreneurPulse.com.au  Fact Sheet for Healthcare Providers:  IncredibleEmployment.be  This test is no t yet approved or cleared by the [place] FDA and  has been authorized for detection and/or diagnosis of SARS-CoV-2 by FDA under an Emergency Use Authorization (EUA). This EUA will remain  in effect (meaning this test can be used) for the duration of the COVID-19 declaration under Section 564(b)(1) of the Act, 21 U.S.C.section 360bbb-3(b)(1), unless the authorization is terminated  or revoked sooner.       Influenza A by PCR NEGATIVE NEGATIVE Final   Influenza B by PCR NEGATIVE NEGATIVE Final    Comment: (NOTE) The Xpert Xpress SARS-CoV-2/FLU/RSV plus assay is intended as an aid in the diagnosis of influenza from Nasopharyngeal swab specimens and should not be used as a sole basis for treatment. Nasal washings and aspirates are unacceptable for Xpert Xpress SARS-CoV-2/FLU/RSV testing.  Fact Sheet  for Patients: EntrepreneurPulse.com.au  Fact Sheet for Healthcare Providers: IncredibleEmployment.be  This test is not yet approved or cleared by the [place] FDA and has been authorized for detection and/or diagnosis of SARS-CoV-2 by FDA under an Emergency Use Authorization (EUA). This EUA will remain in effect (meaning this test can be used) for the duration of the COVID-19 declaration under Section 564(b)(1) of the Act, 21 U.S.C. section 360bbb-3(b)(1), unless the authorization is terminated or revoked.  Performed at [facility], [address] [place],  [address]           Radiology Studies: No results found.      Scheduled Meds:  apixaban  2.5 mg Oral BID   ascorbic acid  500 mg Oral BID   atorvastatin  80 mg  Oral Daily   carvedilol  6.25 mg Oral BID WC   Chlorhexidine Gluconate Cloth  6 each Topical Daily   Chlorhexidine Gluconate Cloth  6 each Topical Q0600   collagenase   Topical Daily   ezetimibe  10 mg Oral Daily   feeding supplement (NEPRO CARB STEADY)  237 mL Oral BID BM   ferrous gluconate  324 mg Oral Q1500   insulin aspart  0-9 Units Subcutaneous TID WC   levETIRAcetam  250 mg Oral Once per day on Mon Wed Fri   levETIRAcetam  500 mg Oral BID   losartan  100 mg Oral QHS   mouth rinse  15 mL Mouth Rinse BID   mirtazapine  7.5 mg Oral QHS   multivitamin  1 tablet Oral QHS   risperiDONE  0.5 mg Oral QHS   Continuous Infusions:   LOS: 12 days    Time spent: 15 mins     Wyvonnia Dusky, MD [facility] Pager [phone]  If 7PM-7AM, please contact night-coverage 05/29/2021, 7:53 AM

## 2021-05-29 NOTE — Plan of Care (Signed)
  Problem: Education: Goal: Knowledge of General Education information will improve Description: Including pain rating scale, medication(s)/side effects and non-pharmacologic comfort measures Outcome: Progressing   Problem: Health Behavior/Discharge Planning: Goal: Ability to manage health-related needs will improve Outcome: Progressing   Problem: Clinical Measurements: Goal: Ability to maintain clinical measurements within normal limits will improve Outcome: Progressing Goal: Will remain free from infection Outcome: Progressing Goal: Diagnostic test results will improve Outcome: Progressing Goal: Respiratory complications will improve Outcome: Progressing Goal: Cardiovascular complication will be avoided Outcome: Progressing   Problem: Nutrition: Goal: Adequate nutrition will be maintained Outcome: Progressing   Problem: Pain Managment: Goal: General experience of comfort will improve Outcome: Progressing   Problem: Safety: Goal: Ability to remain free from injury will improve Outcome: Progressing   Problem: Skin Integrity: Goal: Risk for impaired skin integrity will decrease Outcome: Progressing   

## 2021-05-30 ENCOUNTER — Inpatient Hospital Stay: Payer: Medicare Other

## 2021-05-30 DIAGNOSIS — G309 Alzheimer's disease, unspecified: Secondary | ICD-10-CM | POA: Diagnosis not present

## 2021-05-30 DIAGNOSIS — Z992 Dependence on renal dialysis: Secondary | ICD-10-CM | POA: Diagnosis not present

## 2021-05-30 DIAGNOSIS — D696 Thrombocytopenia, unspecified: Secondary | ICD-10-CM | POA: Diagnosis not present

## 2021-05-30 DIAGNOSIS — I959 Hypotension, unspecified: Secondary | ICD-10-CM

## 2021-05-30 DIAGNOSIS — N186 End stage renal disease: Secondary | ICD-10-CM | POA: Diagnosis not present

## 2021-05-30 LAB — GLUCOSE, CAPILLARY
Glucose-Capillary: 182 mg/dL — ABNORMAL HIGH (ref 70–99)
Glucose-Capillary: 124 mg/dL — ABNORMAL HIGH (ref 70–99)
Glucose-Capillary: 140 mg/dL — ABNORMAL HIGH (ref 70–99)
Glucose-Capillary: 128 mg/dL — ABNORMAL HIGH (ref 70–99)
Glucose-Capillary: 142 mg/dL — ABNORMAL HIGH (ref 70–99)

## 2021-05-30 LAB — CBC
HCT: 35.7 % — ABNORMAL LOW (ref 36.0–46.0)
Hemoglobin: 12.3 g/dL (ref 12.0–15.0)
MCH: 28.8 pg (ref 26.0–34.0)
MCHC: 34.5 g/dL (ref 30.0–36.0)
MCV: 83.6 fL (ref 80.0–100.0)
Platelets: 121 10*3/uL — ABNORMAL LOW (ref 150–400)
RBC: 4.27 MIL/uL (ref 3.87–5.11)
RDW: 13.1 % (ref 11.5–15.5)
WBC: 12.6 10*3/uL — ABNORMAL HIGH (ref 4.0–10.5)
nRBC: 0 % (ref 0.0–0.2)

## 2021-05-30 LAB — BASIC METABOLIC PANEL
Anion gap: 13 (ref 5–15)
BUN: 78 mg/dL — ABNORMAL HIGH (ref 8–23)
CO2: 21 mmol/L — ABNORMAL LOW (ref 22–32)
Calcium: 8.7 mg/dL — ABNORMAL LOW (ref 8.9–10.3)
Chloride: 99 mmol/L (ref 98–111)
Creatinine, Ser: 6.71 mg/dL — ABNORMAL HIGH (ref 0.44–1.00)
GFR, Estimated: 6 mL/min — ABNORMAL LOW (ref >60)
Glucose, Bld: 186 mg/dL — ABNORMAL HIGH (ref 70–99)
Potassium: 4.7 mmol/L (ref 3.5–5.1)
Sodium: 133 mmol/L — ABNORMAL LOW (ref 135–145)

## 2021-05-30 LAB — PROCALCITONIN: Procalcitonin: 96.84 ng/mL

## 2021-05-30 MED ORDER — HEPARIN SODIUM (PORCINE) 1000 UNIT/ML IJ SOLN
INTRAMUSCULAR | Status: AC
  Filled 2021-05-30: qty 10

## 2021-05-30 MED ORDER — SODIUM CHLORIDE 0.9 % IV BOLUS: 1000.0000 mL | Freq: Once | INTRAVENOUS | Status: AC

## 2021-05-30 NOTE — Progress Notes (Signed)
[facility]  ROUNDING NOTE   Subjective:   Patient seen and evaluated during dialysis   HEMODIALYSIS FLOWSHEET:  Blood Flow Rate (mL/min): 400 mL/min Arterial Pressure (mmHg): -170 mmHg Venous Pressure (mmHg): 130 mmHg Transmembrane Pressure (mmHg): 50 mmHg Ultrafiltration Rate (mL/min): 0 mL/min Dialysate Flow Rate (mL/min): 500 ml/min Conductivity: Machine : 13.6 Conductivity: Machine : 13.6 Dialysis Fluid Bolus: Normal Saline Bolus Amount (mL): 250 mL  Patient seated in recliner, tolerating treatment well   Objective:  Vital signs in last 24 hours:  Temp:  [97.6 F (36.4 C)-100.4 F (38 C)] 97.6 F (36.4 C) (11/28 1215) Pulse Rate:  [67-85] 85 (11/28 1245) Resp:  [15-22] 20 (11/28 1245) BP: (109-166)/(42-98) 140/42 (11/28 1245) SpO2:  [98 %-100 %] 100 % (11/28 1245)  Weight change:  Filed Weights   05/18/21 1545 05/27/21 1628  Weight: 60.4 kg 57.4 kg    Intake/Output: I/O last 3 completed shifts: In: 120 [P.O.:120] Out: -    Intake/Output this shift:  Total I/O In: -  Out: 307 [Other:307]  Physical Exam: General: no acute distress, seated in recliner  Head: Normocephalic, atraumatic. Moist oral mucosal membranes  Lungs:  Clear to auscultation, normal effort  Heart: No rub or gallop  Abdomen:  Soft, nontender, nondistended  Extremities:  No peripheral edema.  Neurologic: Alert to self and place,  Left sided weakness.   Skin: No lesions  Access: Right IJ permcath    Basic Metabolic Panel: Recent Labs  Lab 05/25/21 0328 05/26/21 0405 05/27/21 0524 05/28/21 0551 05/29/21 0623 05/30/21 0654  NA 133* 133* 133* 135 133* 133*  K 3.9 3.8 3.9 4.2 4.1 4.7  CL 96* 98 99 101 99 99  CO2 25 26 23 24 23  21*  GLUCOSE 146* 146* 169* 207* 192* 186*  BUN 57* 43* 63* 41* 60* 78*  CREATININE 5.08* 4.12* 6.12* 4.16* 5.60* 6.71*  CALCIUM 8.5* 8.2* 8.6* 8.7* 8.5* 8.7*  PHOS 4.3  --   --   --   --   --      Liver Function Tests: Recent Labs   Lab 05/25/21 0328  ALBUMIN 3.4*    No results for input(s): LIPASE, AMYLASE in the last 168 hours. No results for input(s): AMMONIA in the last 168 hours.   CBC: Recent Labs  Lab 05/26/21 0405 05/27/21 0524 05/28/21 0551 05/29/21 0623 05/30/21 0654  WBC 4.7 4.6 13.8* 9.8 12.6*  HGB 12.6 12.6 12.3 11.8* 12.3  HCT 35.9* 35.7* 35.0* 34.3* 35.7*  MCV 85.3 84.8 85.0 84.3 83.6  PLT 117* 127* 122* 123* 121*     Cardiac Enzymes: No results for input(s): CKTOTAL, CKMB, CKMBINDEX, TROPONINI in the last 168 hours.  BNP: Invalid input(s): POCBNP  CBG: Recent Labs  Lab 05/29/21 0845 05/29/21 1214 05/29/21 1650 05/29/21 2132 05/30/21 0735  GLUCAP 172* 156* 189* 222* 182*     Microbiology: Results for orders placed or performed during the hospital encounter of 05/16/21  Resp Panel by RT-PCR (Flu A&B, Covid) Nasopharyngeal Swab     Status: None   Collection Time: 05/16/21 10:07 PM   Specimen: Nasopharyngeal Swab; Nasopharyngeal(NP) swabs in vial transport medium  Result Value Ref Range Status   SARS Coronavirus 2 by RT PCR NEGATIVE NEGATIVE Final    Comment: (NOTE) SARS-CoV-2 target nucleic acids are NOT DETECTED.  The SARS-CoV-2 RNA is generally detectable in upper respiratory specimens during the acute phase of infection. The lowest concentration of SARS-CoV-2 viral copies this assay can detect is 138 copies/mL.  A negative result does not preclude SARS-Cov-2 infection and should not be used as the sole basis for treatment or other patient management decisions. A negative result may occur with  improper specimen collection/handling, submission of specimen other than nasopharyngeal swab, presence of viral mutation(s) within the areas targeted by this assay, and inadequate number of viral copies(<138 copies/mL). A negative result must be combined with clinical observations, patient history, and epidemiological information. The expected result is Negative.  Fact  Sheet for Patients:  EntrepreneurPulse.com.au  Fact Sheet for Healthcare Providers:  IncredibleEmployment.be  This test is no t yet approved or cleared by the [place] FDA and  has been authorized for detection and/or diagnosis of SARS-CoV-2 by FDA under an Emergency Use Authorization (EUA). This EUA will remain  in effect (meaning this test can be used) for the duration of the COVID-19 declaration under Section 564(b)(1) of the Act, 21 U.S.C.section 360bbb-3(b)(1), unless the authorization is terminated  or revoked sooner.       Influenza A by PCR NEGATIVE NEGATIVE Final   Influenza B by PCR NEGATIVE NEGATIVE Final    Comment: (NOTE) The Xpert Xpress SARS-CoV-2/FLU/RSV plus assay is intended as an aid in the diagnosis of influenza from Nasopharyngeal swab specimens and should not be used as a sole basis for treatment. Nasal washings and aspirates are unacceptable for Xpert Xpress SARS-CoV-2/FLU/RSV testing.  Fact Sheet for Patients: EntrepreneurPulse.com.au  Fact Sheet for Healthcare Providers: IncredibleEmployment.be  This test is not yet approved or cleared by the [place] FDA and has been authorized for detection and/or diagnosis of SARS-CoV-2 by FDA under an Emergency Use Authorization (EUA). This EUA will remain in effect (meaning this test can be used) for the duration of the COVID-19 declaration under Section 564(b)(1) of the Act, 21 U.S.C. section 360bbb-3(b)(1), unless the authorization is terminated or revoked.  Performed at [facility], [address] [place], [place] [address]   Urine Culture     Status: Abnormal   Collection Time: 05/16/21 10:07 PM   Specimen: In/Out Cath Urine  Result Value Ref Range Status   Specimen Description   Final    IN/OUT CATH URINE Performed at [facility], [address], [place], [place] [address]    Special Requests    Final    NONE Performed at [facility], [address] [place], [place] [address]    Culture (A)  Final    >=100,000 COLONIES/mL GARDNERELLA VAGINALIS Standardized susceptibility testing for this organism is not available. 80,000 COLONIES/mL DIPHTHEROIDS(CORYNEBACTERIUM SPECIES)    Report Status 05/19/2021 FINAL  Final  Resp Panel by RT-PCR (Flu A&B, Covid) Nasopharyngeal Swab     Status: None   Collection Time: 05/27/21 12:40 PM   Specimen: Nasopharyngeal Swab; Nasopharyngeal(NP) swabs in vial transport medium  Result Value Ref Range Status   SARS Coronavirus 2 by RT PCR NEGATIVE NEGATIVE Final    Comment: (NOTE) SARS-CoV-2 target nucleic acids are NOT DETECTED.  The SARS-CoV-2 RNA is generally detectable in upper respiratory specimens during the acute phase of infection. The lowest concentration of SARS-CoV-2 viral copies this assay can detect is 138 copies/mL. A negative result does not preclude SARS-Cov-2 infection and should not be used as the sole basis for treatment or other patient management decisions. A negative result may occur with  improper specimen collection/handling, submission of specimen other than nasopharyngeal swab, presence of viral mutation(s) within the areas targeted by this assay, and inadequate number of viral copies(<138 copies/mL). A negative result  must be combined with clinical observations, patient history, and epidemiological information. The expected result is Negative.  Fact Sheet for Patients:  EntrepreneurPulse.com.au  Fact Sheet for Healthcare Providers:  IncredibleEmployment.be  This test is no t yet approved or cleared by the [place] FDA and  has been authorized for detection and/or diagnosis of SARS-CoV-2 by FDA under an Emergency Use Authorization (EUA). This EUA will remain  in effect (meaning this test can be used) for the duration of the COVID-19 declaration under Section  564(b)(1) of the Act, 21 U.S.C.section 360bbb-3(b)(1), unless the authorization is terminated  or revoked sooner.       Influenza A by PCR NEGATIVE NEGATIVE Final   Influenza B by PCR NEGATIVE NEGATIVE Final    Comment: (NOTE) The Xpert Xpress SARS-CoV-2/FLU/RSV plus assay is intended as an aid in the diagnosis of influenza from Nasopharyngeal swab specimens and should not be used as a sole basis for treatment. Nasal washings and aspirates are unacceptable for Xpert Xpress SARS-CoV-2/FLU/RSV testing.  Fact Sheet for Patients: EntrepreneurPulse.com.au  Fact Sheet for Healthcare Providers: IncredibleEmployment.be  This test is not yet approved or cleared by the [place] FDA and has been authorized for detection and/or diagnosis of SARS-CoV-2 by FDA under an Emergency Use Authorization (EUA). This EUA will remain in effect (meaning this test can be used) for the duration of the COVID-19 declaration under Section 564(b)(1) of the Act, 21 U.S.C. section 360bbb-3(b)(1), unless the authorization is terminated or revoked.  Performed at [facility], [address] [place], [place] [address]     Coagulation Studies: No results for input(s): LABPROT, INR in the last 72 hours.  Urinalysis: No results for input(s): COLORURINE, LABSPEC, PHURINE, GLUCOSEU, HGBUR, BILIRUBINUR, KETONESUR, PROTEINUR, UROBILINOGEN, NITRITE, LEUKOCYTESUR in the last 72 hours.  Invalid input(s): APPERANCEUR     Imaging: No results found.   Medications:      apixaban  2.5 mg Oral BID   ascorbic acid  500 mg Oral BID   atorvastatin  80 mg Oral Daily   carvedilol  6.25 mg Oral BID WC   Chlorhexidine Gluconate Cloth  6 each Topical Daily   Chlorhexidine Gluconate Cloth  6 each Topical Q0600   collagenase   Topical Daily   ezetimibe  10 mg Oral Daily   feeding supplement (NEPRO CARB STEADY)  237 mL Oral BID BM   ferrous gluconate  324 mg Oral  Q1500   heparin sodium (porcine)       insulin aspart  0-9 Units Subcutaneous TID WC   levETIRAcetam  250 mg Oral Once per day on Mon Wed Fri   levETIRAcetam  500 mg Oral BID   losartan  100 mg Oral QHS   mouth rinse  15 mL Mouth Rinse BID   mirtazapine  7.5 mg Oral QHS   multivitamin  1 tablet Oral QHS   risperiDONE  0.5 mg Oral QHS   acetaminophen **OR** acetaminophen, haloperidol lactate, labetalol, ondansetron **OR** ondansetron (ZOFRAN) IV, risperiDONE, senna-docusate  Assessment/ Plan:  Brianna Dodson is a 87 y.o. black female with end stage renal disease on hemodialysis, hypertension, CVA, vascular dementia, hyperlipidemia, diabetes mellitus type II, atrial fibrillation who is admitted to [facility] on 05/16/2021 for Confusion [R41.0] Agitation [R45.1]   [facility] MWF [facility] [address] RIJ permcath 65.5kg   End Stage Renal Disease requiring hemodialysis:  -Currently receiving dialysis, tolerating well.  UF goal of 1 L as tolerated.  Next treatment scheduled for Wednesday, if remains inpatient.  Dialysis  coordinator aware of patient and awaiting discharge planning.   Altered mental status: Patient with history of chronic right MCA stroke and chronic atrophy.  Abnormal EEG. Levetiracetam at neurology recommendations of 500mg  twice daily with additional 250 mg dose after dialysis.  Mentation continues to fluctuate daily.    Hypertension: with hypertension urgency on admission. Home regimen includes losartan, amlodipine, carvedilol, hydralazine.   - IV labatelol PRN  -Managed with losartan, carvedilol -BP stable    Anemia of chronic kidney disease:  Lab Results  Component Value Date   HGB 12.3 05/30/2021    Hemoglobin at acceptable range  Secondary Hyperparathyroidism:  Lab Results  Component Value Date   PTH 83 (H) 01/06/2016   CALCIUM 8.7 (L) 05/30/2021   CAION 1.04 (L) 02/24/2021   PHOS 4.3 05/25/2021     Calcium and phosphorus are within acceptable range.   LOS:  [place] 11/28/202212:56 PM

## 2021-05-30 NOTE — Progress Notes (Signed)
At 1944, patient's MEWS was yellow due to her temp being 101.4 and an elevated HR. PRN tylenol was given with no effectiveness as temp continued to rise. MEWS guidelines are followed and the patient's follow up vitals scored red which resulted in the on call provider, Amy Cox, D.O. initiating rapid response. Dr. Judd Gaudier comes to the bedside to assess the patient. Dr. Damita Dunnings gives verbal orders to bolus the patient with 1 liter of  normal saline and continue to monitor the temp. Cxr is abnormal with a suspected pneumothorax to the right lobe. Both providers are notified of the imaging and Dr. Tobie Poet orders a repeat chest xray that is pending at this time. Patient vitals are now stable as interventions are effective. Patient is in bed, alert with equal rise and fall of the chest noted. No acute distress is warranted at this time.

## 2021-05-30 NOTE — Progress Notes (Signed)
   05/30/21 1944  Assess: MEWS Score  Temp (!) 101.1 F (38.4 C)  BP (!) 101/50  Pulse Rate 84  Resp (!) 1  SpO2 97 %  O2 Device Room Air  Assess: MEWS Score  MEWS Temp 1  MEWS Systolic 0  MEWS Pulse 0  MEWS RR 2  MEWS LOC 0  MEWS Score 3  MEWS Score Color Yellow  Assess: if the MEWS score is Yellow or Red  Were vital signs taken at a resting state? Yes  Focused Assessment Change from prior assessment (see assessment flowsheet)  Does the patient meet 2 or more of the SIRS criteria? Yes  Does the patient have a confirmed or suspected source of infection? No  MEWS guidelines implemented *See Row Information* Yes  Treat  MEWS Interventions Administered prn meds/treatments  Pain Scale PAINAD  Breathing 0  Negative Vocalization 0  Facial Expression 0  Body Language 0  Consolability 0  PAINAD Score 0  Take Vital Signs  Increase Vital Sign Frequency  Yellow: Q 2hr X 2 then Q 4hr X 2, if remains yellow, continue Q 4hrs  Escalate  MEWS: Escalate Yellow: discuss with charge nurse/RN and consider discussing with provider and RRT  Notify: Charge Nurse/RN  Name of Charge Nurse/RN Notified [place]  Date Charge Nurse/RN Notified 05/30/21  Time Charge Nurse/RN Notified 2013  Document  Patient Outcome Other (Comment)  Assess: SIRS CRITERIA  SIRS Temperature  1  SIRS Pulse 0  SIRS Respirations  0  SIRS WBC 0  SIRS Score Sum  1

## 2021-05-30 NOTE — Progress Notes (Signed)
PROGRESS NOTE    Brianna Dodson  EXB:[id number] DOB: [birth year] DOA: 05/16/2021 PCP: [name], MD    Assessment & Plan:   Principal Problem:   Agitation Active Problems:   Essential hypertension   Type II diabetes mellitus with renal manifestations ([place])   Atrial fibrillation (HCC)   Mixed Alzheimer's and vascular dementia ([place])   End stage renal disease on dialysis ([place])   Recurrent strokes (HCC)   Thrombocytopenia ([place])   Chronic anticoagulation   Pressure injury of skin   Hypertensive urgency   Post-ictal state ([place])  Acute encephalopathy:  suspected metabolic with malignant hypertension, Alzheimer's & vascular dementia. CT head shows no acute intracranial abnormality but chronic right MCA infarct & chronic atrophy. EEG suggestive of cortical dysfunction arising from left frontotemporal region, nonspecific etiology but could be secondary to underlying structural abnormality, post-ictal state.  No seizures or epileptiform discharges were seen throughout the recording. Continue on remeron, risperdal, & keppra. Haldol prn. Neuro & psych recs are apprec   ESRD: on HD MWF. Management per nephro   Thrombocytopenia: etiology unclear, labile.   DM2: HbA1c 5.5, well controlled. Diet controlled    Hx of CVA:  with vascular dementia. MRI of the brain shows no acute CVA, chronic CVAs   HLD: continue on statin, zetia   Malignant HTN: resolved but still w/ HTN. Continue on coreg, losartan   DVT prophylaxis: eliquis  Code Status: full  Family Communication:   Disposition Plan: medically stable d/c to home w/ family care but pt evidently does not have transportation to HD and CM is working on this. All equipment has been delivered   Level of care: Med-Surg  Status is: Inpatient  Remains inpatient appropriate because: Medically stable.  Pt evidently does not have transportation to HD and CM is working on this. All equipment has been delivered   Consultants:  Nephro    Procedures:   Antimicrobials:   Subjective: Pt denies any complaints   Objective: Vitals:   05/29/21 1509 05/29/21 1923 05/30/21 0424 05/30/21 0733  BP: (!) 135/56 (!) 142/58 138/60 (!) 166/62  Pulse: 70 67 71 83  Resp: 18 16 18 18   Temp: 99.2 F (37.3 C) 99.1 F (37.3 C) 99 F (37.2 C) 99.6 F (37.6 C)  TempSrc:  Oral    SpO2: 100% 100% 100% 100%  Weight:      Height:        Intake/Output Summary (Last 24 hours) at 05/30/2021 0736 Last data filed at 05/29/2021 0900 Gross per 24 hour  Intake 120 ml  Output --  Net 120 ml   Filed Weights   05/18/21 1545 05/27/21 1628  Weight: 60.4 kg 57.4 kg    Examination:  General exam: Appears calm & comfortable  Respiratory system: clear breath sounds b/l  Cardiovascular system: S1/S2+. No rubs or clicks  Gastrointestinal system: Abd is soft, NT, ND & hypoactive bowel sounds  Central nervous system: Alert and awake. Moves all extremities  Psychiatry: judgement and insight appears poor. Flat mood and affect     Data Reviewed: I have personally reviewed following labs and imaging studies  CBC: Recent Labs  Lab 05/26/21 0405 05/27/21 0524 05/28/21 0551 05/29/21 0623 05/30/21 0654  WBC 4.7 4.6 13.8* 9.8 12.6*  HGB 12.6 12.6 12.3 11.8* 12.3  HCT 35.9* 35.7* 35.0* 34.3* 35.7*  MCV 85.3 84.8 85.0 84.3 83.6  PLT 117* 127* 122* 123* 102*   Basic Metabolic Panel: Recent Labs  Lab 05/25/21 0328 05/26/21 0405 05/27/21  5188 05/28/21 0551 05/29/21 0623  NA 133* 133* 133* 135 133*  K 3.9 3.8 3.9 4.2 4.1  CL 96* 98 99 101 99  CO2 25 26 23 24 23   GLUCOSE 146* 146* 169* 207* 192*  BUN 57* 43* 63* 41* 60*  CREATININE 5.08* 4.12* 6.12* 4.16* 5.60*  CALCIUM 8.5* 8.2* 8.6* 8.7* 8.5*  PHOS 4.3  --   --   --   --    GFR: Estimated Creatinine Clearance: 6.6 mL/min (A) (by C-G formula based on SCr of 5.6 mg/dL (H)). Liver Function Tests: Recent Labs  Lab 05/25/21 0328  ALBUMIN 3.4*   No results for input(s):  LIPASE, AMYLASE in the last 168 hours. No results for input(s): AMMONIA in the last 168 hours.  Coagulation Profile: No results for input(s): INR, PROTIME in the last 168 hours. Cardiac Enzymes: No results for input(s): CKTOTAL, CKMB, CKMBINDEX, TROPONINI in the last 168 hours. BNP (last 3 results) No results for input(s): PROBNP in the last 8760 hours. HbA1C: No results for input(s): HGBA1C in the last 72 hours. CBG: Recent Labs  Lab 05/28/21 2142 05/29/21 0845 05/29/21 1214 05/29/21 1650 05/29/21 2132  GLUCAP 224* 172* 156* 189* 222*   Lipid Profile: No results for input(s): CHOL, HDL, LDLCALC, TRIG, CHOLHDL, LDLDIRECT in the last 72 hours. Thyroid Function Tests: No results for input(s): TSH, T4TOTAL, FREET4, T3FREE, THYROIDAB in the last 72 hours. Anemia Panel: No results for input(s): VITAMINB12, FOLATE, FERRITIN, TIBC, IRON, RETICCTPCT in the last 72 hours. Sepsis Labs: No results for input(s): PROCALCITON, LATICACIDVEN in the last 168 hours.  Recent Results (from the past 240 hour(s))  Resp Panel by RT-PCR (Flu A&B, Covid) Nasopharyngeal Swab     Status: None   Collection Time: 05/27/21 12:40 PM   Specimen: Nasopharyngeal Swab; Nasopharyngeal(NP) swabs in vial transport medium  Result Value Ref Range Status   SARS Coronavirus 2 by RT PCR NEGATIVE NEGATIVE Final    Comment: (NOTE) SARS-CoV-2 target nucleic acids are NOT DETECTED.  The SARS-CoV-2 RNA is generally detectable in upper respiratory specimens during the acute phase of infection. The lowest concentration of SARS-CoV-2 viral copies this assay can detect is 138 copies/mL. A negative result does not preclude SARS-Cov-2 infection and should not be used as the sole basis for treatment or other patient management decisions. A negative result may occur with  improper specimen collection/handling, submission of specimen other than nasopharyngeal swab, presence of viral mutation(s) within the areas targeted by  this assay, and inadequate number of viral copies(<138 copies/mL). A negative result must be combined with clinical observations, patient history, and epidemiological information. The expected result is Negative.  Fact Sheet for Patients:  EntrepreneurPulse.com.au  Fact Sheet for Healthcare Providers:  IncredibleEmployment.be  This test is no t yet approved or cleared by the [place] FDA and  has been authorized for detection and/or diagnosis of SARS-CoV-2 by FDA under an Emergency Use Authorization (EUA). This EUA will remain  in effect (meaning this test can be used) for the duration of the COVID-19 declaration under Section 564(b)(1) of the Act, 21 U.S.C.section 360bbb-3(b)(1), unless the authorization is terminated  or revoked sooner.       Influenza A by PCR NEGATIVE NEGATIVE Final   Influenza B by PCR NEGATIVE NEGATIVE Final    Comment: (NOTE) The Xpert Xpress SARS-CoV-2/FLU/RSV plus assay is intended as an aid in the diagnosis of influenza from Nasopharyngeal swab specimens and should not be used as a sole basis for treatment. Nasal  washings and aspirates are unacceptable for Xpert Xpress SARS-CoV-2/FLU/RSV testing.  Fact Sheet for Patients: EntrepreneurPulse.com.au  Fact Sheet for Healthcare Providers: IncredibleEmployment.be  This test is not yet approved or cleared by the [place] FDA and has been authorized for detection and/or diagnosis of SARS-CoV-2 by FDA under an Emergency Use Authorization (EUA). This EUA will remain in effect (meaning this test can be used) for the duration of the COVID-19 declaration under Section 564(b)(1) of the Act, 21 U.S.C. section 360bbb-3(b)(1), unless the authorization is terminated or revoked.  Performed at [facility], [address], [place], [place] [address]           Radiology Studies: No results found.      Scheduled  Meds:  apixaban  2.5 mg Oral BID   ascorbic acid  500 mg Oral BID   atorvastatin  80 mg Oral Daily   carvedilol  6.25 mg Oral BID WC   Chlorhexidine Gluconate Cloth  6 each Topical Daily   Chlorhexidine Gluconate Cloth  6 each Topical Q0600   collagenase   Topical Daily   ezetimibe  10 mg Oral Daily   feeding supplement (NEPRO CARB STEADY)  237 mL Oral BID BM   ferrous gluconate  324 mg Oral Q1500   insulin aspart  0-9 Units Subcutaneous TID WC   levETIRAcetam  250 mg Oral Once per day on Mon Wed Fri   levETIRAcetam  500 mg Oral BID   losartan  100 mg Oral QHS   mouth rinse  15 mL Mouth Rinse BID   mirtazapine  7.5 mg Oral QHS   multivitamin  1 tablet Oral QHS   risperiDONE  0.5 mg Oral QHS   Continuous Infusions:   LOS: 13 days    Time spent: 15 mins     Wyvonnia Dusky, MD [facility] Pager [phone]  If 7PM-7AM, please contact night-coverage 05/30/2021, 7:36 AM

## 2021-05-30 NOTE — Progress Notes (Shared)
°   05/30/21 2102  Vitals  Temp (!) 102.5 F (39.2 C)  Temp Source Oral  BP (!) 84/38  MAP (mmHg) (!) 51  BP Method Automatic  Pulse Rate 81  Pulse Rate Source Monitor  MEWS COLOR  MEWS Score Color Red  Oxygen Therapy  SpO2 98 %  MEWS Score  MEWS Temp 2  MEWS Systolic 1  MEWS Pulse 0  MEWS RR 2  MEWS LOC 0  MEWS Score 5

## 2021-05-30 NOTE — Progress Notes (Addendum)
bp dropped to 75/55- UF off, flushed 150cc nss then rpt bp-118/59. Pt is alert and asymptomatic  UF goal decreased from 1.5 to 1 L. Dr. Holley Raring And Colon Flattery NP was made aware.

## 2021-05-30 NOTE — Significant Event (Addendum)
Cross Coverage Note   09:08 PM: Received message from nursing that patient has a red mews score.   Her abnormal parameters are temperature of 102. Per RN, patient received tylenol with no affect.   Her MAP was low during dialysis and it was stopped at 1 L fluid removal.   Repeat vitals: Temp 102.5 (oral), BP 84/38, MAP 51, HR 81, RR 21  Brianna Dodson is a 87 year old female with ESRD on HD, moderate-advance dementia who presented on 05/16/21 for chief concerns of metabolic encephalopathy who has developed hypotension after hemodialysis and fever.   Plan:  Stat CXR, EKG, repeat blood glucose Call Rapid response Communciated with in-house hospitalist who states she will see the patient ICU was consulted and discussed ICU on call, Toribio Harbour, who states she will see the patient   Approximately 10:05 PM - Received RN message stating that radiology called floor with emergent results: suspected small pneumothorax on left.   - Repeat CXR ordered for midnight. - Procalcitonin ordered - Leukocytosis on AM labs noted  Dr. Tobie Poet [facility]

## 2021-05-30 NOTE — Progress Notes (Addendum)
Dr. Enriqueta Shutter from Radiologist suspects pneumothorax to right side. On call provider notified.

## 2021-05-30 NOTE — TOC Progression Note (Signed)
Transition of Care [facility]) - Progression Note    Patient Details  Name: Brianna Dodson MRN: [id number] Date of Birth: 1934-01-27  Transition of Care [facility]) CM/SW [place], LCSW Phone Number: 05/30/2021, 2:37 PM  Clinical Narrative: Sister confirmed hospital bed was delivered. 3-in-1 was delivered to the room so someone will have to come pick it up. Sister concerned she cannot transport patient home from HD and said no one is helping her. [facility] is able to transport her with wheelchair Lucianne Lei but there would be out-of-pocket cost depending on distance. Emailed referral to them. They need 48 hours notice before they can transport. Included sister's phone number on information sent. HD coordinator said patient is reclining in HD chair. Concerned about her being able to sit up in wheelchair without sliding out.    Expected Discharge Plan: [place] Barriers to Discharge: Equipment Delay [facility] bed was not delived on 11/26 as arranged on 05/27/21. [employer] was contacted twice on 11/26 and it was to be prioritized as patient's needed EMS transport to home.)  Expected Discharge Plan and Services Expected Discharge Plan: [place] Acute Care Choice: Resumption of Svcs/PTA Provider Living arrangements for the past 2 months: Single Family Home                                       Social Determinants of Health (SDOH) Interventions    Readmission Risk Interventions Readmission Risk Prevention Plan 05/18/2021 03/03/2021 03/16/2019  Transportation Screening Complete Complete Complete  PCP or Specialist Appt within 5-7 Days - - Complete  PCP or Specialist Appt within 3-5 Days Complete Complete -  Home Care Screening - - Complete  Medication Review (RN CM) - - Complete  HRI or Home Care Consult Complete Complete -  Social Work Consult for [place] Planning/Counseling Complete Not Complete -  SW consult not completed  comments - RNCM assigned to patient -  Palliative Care Screening Not Applicable Not Applicable -  Medication Review (RN Care Manager) Complete Complete -  Some recent data might be hidden

## 2021-05-30 NOTE — Progress Notes (Signed)
Inpatient Diabetes Program Recommendations  AACE/ADA: New Consensus Statement on Inpatient Glycemic Control (2015)  Target Ranges:  Prepandial:   less than 140 mg/dL      Peak postprandial:   less than 180 mg/dL (1-2 hours)      Critically ill patients:  140 - 180 mg/dL   Lab Results  Component Value Date   GLUCAP 182 (H) 05/30/2021   HGBA1C 5.5 05/17/2021    Review of Glycemic Control  Latest Reference Range & Units 05/29/21 08:45 05/29/21 12:14 05/29/21 16:50 05/29/21 21:32 05/30/21 07:35  Glucose-Capillary 70 - 99 mg/dL 172 (H) 156 (H) 189 (H) 222 (H) 182 (H)   Diabetes history: DM 2 Outpatient Diabetes medications: Tradjenta 5 mg Daily Current orders for Inpatient glycemic control:  Novolog 0-9 units tid  Nepro bid between meals  Inpatient Diabetes Program Recommendations:    Glucose trends elevated -  add tradjenta 5 mg Daily (fecally cleared)  Thanks,  Tama Headings RN, MSN, BC-ADM Inpatient Diabetes Coordinator Team Pager [phone] (8a-5p)

## 2021-05-30 NOTE — Progress Notes (Signed)
Rapid Response Event Note   Reason for Call : elevated temp, hypotensive   Initial Focused Assessment: Pt confused and moaning. BP 84/31 on my arrival. No IV access at this time. Temp 102.5. Pt is warm to touch. All other VSS. Primary RN states BP has been low since HD earlier in the day.   Interventions: Peripheral IV started. Dr. Damita Dunnings and Domingo Pulse, ICU NP at bedside. Latest BP 78/33. 1L NS bolus ordered and started. PO Tylenol was given earlier and temp is still high. Will place ice packs per Dr. Damita Dunnings.   Plan of Care: Pt will remain on 2C for now. 1L NS bolus being administered and will see if pt's BP responds to the fluid.    Event Summary:   MD Notified: Dr. Damita Dunnings, Dr. Tobie Poet Call [phone] Arrival [phone] End [id number]  Trellis Paganini, RN

## 2021-05-30 NOTE — Care Management Important Message (Signed)
Important Message  Patient Details  Name: Brianna Dodson MRN: [id number] Date of Birth: Mar 08, 1934   Medicare Important Message Given:  Other (see comment)  Patient not in room upon time of visit, no family.  Confirmed copy of Medicare IM still available on windowsill to reference.    Dannette Barbara 05/30/2021, 11:04 AM

## 2021-05-30 NOTE — Progress Notes (Signed)
PT Cancellation Note  Patient Details Name: Brianna Dodson MRN: [id number] DOB: 05/16/1934   Cancelled Treatment:    Reason Eval/Treat Not Completed: Other (comment).  Pt currently off unit at dialysis.  Will re-attempt PT treatment session at a later date/time.  Leitha Bleak, PT 05/30/21, 10:25 AM

## 2021-05-31 ENCOUNTER — Inpatient Hospital Stay: Payer: Medicare Other

## 2021-05-31 DIAGNOSIS — Z992 Dependence on renal dialysis: Secondary | ICD-10-CM | POA: Diagnosis not present

## 2021-05-31 DIAGNOSIS — D696 Thrombocytopenia, unspecified: Secondary | ICD-10-CM | POA: Diagnosis not present

## 2021-05-31 DIAGNOSIS — N186 End stage renal disease: Secondary | ICD-10-CM | POA: Diagnosis not present

## 2021-05-31 DIAGNOSIS — R451 Restlessness and agitation: Secondary | ICD-10-CM | POA: Diagnosis not present

## 2021-05-31 DIAGNOSIS — G309 Alzheimer's disease, unspecified: Secondary | ICD-10-CM | POA: Diagnosis not present

## 2021-05-31 DIAGNOSIS — Z515 Encounter for palliative care: Secondary | ICD-10-CM | POA: Diagnosis not present

## 2021-05-31 LAB — BASIC METABOLIC PANEL
Anion gap: 12 (ref 5–15)
BUN: 43 mg/dL — ABNORMAL HIGH (ref 8–23)
CO2: 24 mmol/L (ref 22–32)
Calcium: 8.2 mg/dL — ABNORMAL LOW (ref 8.9–10.3)
Chloride: 99 mmol/L (ref 98–111)
Creatinine, Ser: 4.84 mg/dL — ABNORMAL HIGH (ref 0.44–1.00)
GFR, Estimated: 8 mL/min — ABNORMAL LOW (ref >60)
Glucose, Bld: 150 mg/dL — ABNORMAL HIGH (ref 70–99)
Potassium: 3.8 mmol/L (ref 3.5–5.1)
Sodium: 135 mmol/L (ref 135–145)

## 2021-05-31 LAB — CBC
HCT: 31.6 % — ABNORMAL LOW (ref 36.0–46.0)
Hemoglobin: 11.2 g/dL — ABNORMAL LOW (ref 12.0–15.0)
MCH: 29.6 pg (ref 26.0–34.0)
MCHC: 35.4 g/dL (ref 30.0–36.0)
MCV: 83.6 fL (ref 80.0–100.0)
Platelets: 115 10*3/uL — ABNORMAL LOW (ref 150–400)
RBC: 3.78 MIL/uL — ABNORMAL LOW (ref 3.87–5.11)
RDW: 13.5 % (ref 11.5–15.5)
WBC: 14.7 10*3/uL — ABNORMAL HIGH (ref 4.0–10.5)
nRBC: 0 % (ref 0.0–0.2)

## 2021-05-31 LAB — GLUCOSE, CAPILLARY
Glucose-Capillary: 136 mg/dL — ABNORMAL HIGH (ref 70–99)
Glucose-Capillary: 198 mg/dL — ABNORMAL HIGH (ref 70–99)
Glucose-Capillary: 236 mg/dL — ABNORMAL HIGH (ref 70–99)
Glucose-Capillary: 216 mg/dL — ABNORMAL HIGH (ref 70–99)

## 2021-05-31 MED ORDER — MIDODRINE HCL 5 MG PO TABS
10.0000 mg | ORAL_TABLET | Freq: Once | ORAL | Status: AC
  Administered 2021-05-31: 10 mg via ORAL

## 2021-05-31 MED ORDER — DOCUSATE SODIUM 100 MG PO CAPS
200.0000 mg | ORAL_CAPSULE | Freq: Two times a day (BID) | ORAL | Status: DC
  Administered 2021-05-31 – 2021-06-02 (×4): 200 mg via ORAL
  Filled 2021-05-31 (×4): qty 2

## 2021-05-31 MED ORDER — POLYETHYLENE GLYCOL 3350 17 G PO PACK
17.0000 g | PACK | Freq: Every day | ORAL | Status: DC
  Administered 2021-05-31 – 2021-06-02 (×2): 17 g via ORAL
  Filled 2021-05-31: qty 1

## 2021-05-31 NOTE — Progress Notes (Signed)
Palliative: Brianna Dodson, Brianna Dodson is lying quietly in bed.  She appears acutely/chronically ill and quite frail.  She is alert, oriented to person, place, and month.  I believe that she is able to make her basic needs known.  There is no family at bedside at this time.  During my interaction, attending, Dr. Jimmye Norman arrives.  Brianna Dodson is able to take a drink of water without overt signs of aspiration.  Fever noted, blood cultures obtained. Brianna Dodson must be 24 hours without fever prior to discharge.   Conference with attending, TOC team related to patient condition, needs, GOC, disposition.   Plan: At this point continue full scope/full code.   Return home with [facility].  TOC is working with family for equipment and transportation needs.   25 minutes  Brianna Axe, NP Palliative medicine team Team phone [phone] Greater than 50% of this time was spent counseling and coordinating care related to the above assessment and plan

## 2021-05-31 NOTE — Consult Note (Addendum)
[place] Nurse wound follow up Refer to previous [place] progress notes on 11/24.  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 Pt is on a low airloss mattress to reduce pressure and has multiple systemic factors which can impair healing. Dressing procedure/placement/frequency: Topical treatment orders have been provided for bedside nurses to perform to assist with enzymatic debridement of nonviable tissue: continue plan of care as follows; Apply Santyl to coccyx wound Q day, then cover with moist gauze and foam dressing.  (Change foam dressings Q 3 days or PRN soiling.) WOC team will reassess the location weekly to determine if an adjustment in the plan of care is indicated at that time. Thank-you,  Julien Girt MSN, [place], [place], [place], [place]

## 2021-05-31 NOTE — Progress Notes (Addendum)
PROGRESS NOTE   HPI was taken from Dr. Damita Dunnings: Brianna Dodson is a 87 y.o. female with medical history significant for ESRD on HD MWF, anemia of CKD, paroxysmal A. fib on Eliquis, DM, Alzheimer's disease, OSA, HTN, DM, recurrent strokes, who was brought in by EMS for agitation and altered mental status.  EMS had been called out to the home 3 times in the past few days for altered mental status but family declined transport to the emergency room.  Patient went to dialysis earlier in the day but became agitated on her arrival back home and this time family agreed for her to be brought in.  She was reportedly very agitated, striking at both family and EMS.  History is limited due to dementia and also due to sedation from medication administered in the ED for agitation    ED course: In the ED BP was as elevated as 176/143 with otherwise normal vitals Blood work notable for thrombocytopenia of 87,000 (baseline 130-170) but otherwise unremarkable Urinalysis with moderate leukocyte esterase   EKG, personally viewed and interpreted: Sinus at 71 with no acute ST-T wave changes   Imaging: Chest x-ray nonacute Head CT with chronic right MCA infarct chronic atrophic and ischemic changes.   Patient required multiple sedating agents in the ED including Haldol and droperidol   Hospitalist consulted for admission    As per Dr. Fritzi Mandes:  Brianna Dodson is a 87 y.o. female with medical history significant for ESRD on HD MWF, anemia of CKD, paroxysmal A. fib on Eliquis, DM, Alzheimer's disease, OSA, HTN, DM, recurrent strokes, who was brought in by EMS for agitation and altered mental status.  EMS had been called out to the home 3 times in the past few days for altered mental status but family declined transport to the emergency room.  Patient went to dialysis earlier in the day but became agitated on her arrival back home and this time family agreed for her to be brought in. (Please see note from  05/24/21)  As per Dr. Jimmye Norman 11/23-11/29/22: Pt's mental status has waxed and wane. Pt was set to go to SNF on 05/27/21 but pt's sister refused and requested the pt come home w/ home health. Hospital bed and other equipment was delivered to pt's home as per request of the sitter. CM also had to set up transport for the pt to HD for which pt and/or family will have to pay for. Pt's sister was made aware. Overnight, pt had yellow MEWS w/ tachycardia and fever. Pt has had another fever today of unknown etiology. Repeat CXR shows no acute findings, blood cxs are pending. Pt denies any GU symptoms currently. Pt needs be afebrile for at least 24 hours prior to d/c.      Brianna Dodson  IWP:[id number] DOB: 23-Oct-1933 DOA: 05/16/2021 PCP: [name], MD    Assessment & Plan:   Principal Problem:   Agitation Active Problems:   Essential hypertension   Type II diabetes mellitus with renal manifestations ([place])   Atrial fibrillation (HCC)   Mixed Alzheimer's and vascular dementia ([place])   End stage renal disease on dialysis ([place])   Recurrent strokes ([place])   Thrombocytopenia ([place])   Chronic anticoagulation   Pressure injury of skin   Hypertensive urgency   Post-ictal state ([place])  Acute encephalopathy:  suspected metabolic with malignant hypertension, Alzheimer's & vascular dementia. CT head shows no acute intracranial abnormality but chronic right MCA infarct & chronic atrophy. EEG suggestive of cortical dysfunction  arising from left frontotemporal region, nonspecific etiology but could be secondary to underlying structural abnormality, post-ictal state.  No seizures or epileptiform discharges were seen throughout the recording. Continue on remeron, risperdal, & keppra. Haldol prn. Neuro & psych recs are apprec   Fevers: of unknown etiology. Repeat CXR shows no acute findings. Blood cxs are pending. Pt denies any GU symptoms currently.   ESRD: on HD MWF. Management as per nephro     Thrombocytopenia: etiology unclear, labile    DM2: well controlled, HbA1c 5.5. Diet controlled    Hx of CVA:  with vascular dementia. MRI of the brain shows no acute CVA, chronic CVAs   HLD: continue on zetia, statin    Malignant HTN: resolved but still w/ HTN. Continue on coreg, losartan    Unstageable mid coccyx wound: present on admission. Continue w/ wound care    DVT prophylaxis: eliquis  Code Status: full  Family Communication:   Disposition Plan: fever overnight and today of unknown etiology, needs to be afebrile 24 hrs prior to d/c   Level of care: Med-Surg  Status is: Inpatient  Remains inpatient appropriate because:  fever overnight and today of unknown etiology, needs to be afebrile 24 hrs prior to d/c   Consultants:  Nephro   Procedures:   Antimicrobials:   Subjective: Pt c/o fatigue   Objective: Vitals:   05/30/21 2315 05/31/21 0103 05/31/21 0300 05/31/21 0706  BP: 124/61 (!) 143/59 (!) 148/52 (!) 106/51  Pulse: 78 84 88 82  Resp:      Temp: 98.6 F (37 C) 99.9 F (37.7 C) 99.4 F (37.4 C) 100.3 F (37.9 C)  TempSrc: Oral  Axillary Oral  SpO2: 100% 97%  97%  Weight:      Height:        Intake/Output Summary (Last 24 hours) at 05/31/2021 0754 Last data filed at 05/30/2021 1215 Gross per 24 hour  Intake --  Output 307 ml  Net -307 ml   Filed Weights   05/18/21 1545 05/27/21 1628  Weight: 60.4 kg 57.4 kg    Examination:  General exam: Appears comfortable  Respiratory system: clear breath sounds b/l. No rales  Cardiovascular system: S1 & S2+. No rubs or clicks  Gastrointestinal system: Abd is soft, NT, ND & hypoactive bowel sounds  Central nervous system: alert and awake. Moves all extremities  Psychiatry: judgement and insight appears poor. Flat mood and affect    Data Reviewed: I have personally reviewed following labs and imaging studies  CBC: Recent Labs  Lab 05/26/21 0405 05/27/21 0524 05/28/21 0551 05/29/21 0623  05/30/21 0654  WBC 4.7 4.6 13.8* 9.8 12.6*  HGB 12.6 12.6 12.3 11.8* 12.3  HCT 35.9* 35.7* 35.0* 34.3* 35.7*  MCV 85.3 84.8 85.0 84.3 83.6  PLT 117* 127* 122* 123* 322*   Basic Metabolic Panel: Recent Labs  Lab 05/25/21 0328 05/26/21 0405 05/27/21 0524 05/28/21 0551 05/29/21 0623 05/30/21 0654 05/31/21 0657  NA 133*   < > 133* 135 133* 133* 135  K 3.9   < > 3.9 4.2 4.1 4.7 3.8  CL 96*   < > 99 101 99 99 99  CO2 25   < > 23 24 23  21* 24  GLUCOSE 146*   < > 169* 207* 192* 186* 150*  BUN 57*   < > 63* 41* 60* 78* 43*  CREATININE 5.08*   < > 6.12* 4.16* 5.60* 6.71* 4.84*  CALCIUM 8.5*   < > 8.6* 8.7* 8.5* 8.7* 8.2*  PHOS 4.3  --   --   --   --   --   --    < > = values in this interval not displayed.   GFR: Estimated Creatinine Clearance: 7.6 mL/min (A) (by C-G formula based on SCr of 4.84 mg/dL (H)). Liver Function Tests: Recent Labs  Lab 05/25/21 0328  ALBUMIN 3.4*   No results for input(s): LIPASE, AMYLASE in the last 168 hours. No results for input(s): AMMONIA in the last 168 hours.  Coagulation Profile: No results for input(s): INR, PROTIME in the last 168 hours. Cardiac Enzymes: No results for input(s): CKTOTAL, CKMB, CKMBINDEX, TROPONINI in the last 168 hours. BNP (last 3 results) No results for input(s): PROBNP in the last 8760 hours. HbA1C: No results for input(s): HGBA1C in the last 72 hours. CBG: Recent Labs  Lab 05/30/21 1320 05/30/21 1627 05/30/21 1947 05/30/21 2159 05/31/21 0742  GLUCAP 124* 140* 128* 142* 136*   Lipid Profile: No results for input(s): CHOL, HDL, LDLCALC, TRIG, CHOLHDL, LDLDIRECT in the last 72 hours. Thyroid Function Tests: No results for input(s): TSH, T4TOTAL, FREET4, T3FREE, THYROIDAB in the last 72 hours. Anemia Panel: No results for input(s): VITAMINB12, FOLATE, FERRITIN, TIBC, IRON, RETICCTPCT in the last 72 hours. Sepsis Labs: Recent Labs  Lab 05/30/21 2236  PROCALCITON 96.84    Recent Results (from the past 240  hour(s))  Resp Panel by RT-PCR (Flu A&B, Covid) Nasopharyngeal Swab     Status: None   Collection Time: 05/27/21 12:40 PM   Specimen: Nasopharyngeal Swab; Nasopharyngeal(NP) swabs in vial transport medium  Result Value Ref Range Status   SARS Coronavirus 2 by RT PCR NEGATIVE NEGATIVE Final    Comment: (NOTE) SARS-CoV-2 target nucleic acids are NOT DETECTED.  The SARS-CoV-2 RNA is generally detectable in upper respiratory specimens during the acute phase of infection. The lowest concentration of SARS-CoV-2 viral copies this assay can detect is 138 copies/mL. A negative result does not preclude SARS-Cov-2 infection and should not be used as the sole basis for treatment or other patient management decisions. A negative result may occur with  improper specimen collection/handling, submission of specimen other than nasopharyngeal swab, presence of viral mutation(s) within the areas targeted by this assay, and inadequate number of viral copies(<138 copies/mL). A negative result must be combined with clinical observations, patient history, and epidemiological information. The expected result is Negative.  Fact Sheet for Patients:  EntrepreneurPulse.com.au  Fact Sheet for Healthcare Providers:  IncredibleEmployment.be  This test is no t yet approved or cleared by the [place] FDA and  has been authorized for detection and/or diagnosis of SARS-CoV-2 by FDA under an Emergency Use Authorization (EUA). This EUA will remain  in effect (meaning this test can be used) for the duration of the COVID-19 declaration under Section 564(b)(1) of the Act, 21 U.S.C.section 360bbb-3(b)(1), unless the authorization is terminated  or revoked sooner.       Influenza A by PCR NEGATIVE NEGATIVE Final   Influenza B by PCR NEGATIVE NEGATIVE Final    Comment: (NOTE) The Xpert Xpress SARS-CoV-2/FLU/RSV plus assay is intended as an aid in the diagnosis of influenza from  Nasopharyngeal swab specimens and should not be used as a sole basis for treatment. Nasal washings and aspirates are unacceptable for Xpert Xpress SARS-CoV-2/FLU/RSV testing.  Fact Sheet for Patients: EntrepreneurPulse.com.au  Fact Sheet for Healthcare Providers: IncredibleEmployment.be  This test is not yet approved or cleared by the [place] FDA and has been authorized for detection and/or diagnosis  of SARS-CoV-2 by FDA under an Emergency Use Authorization (EUA). This EUA will remain in effect (meaning this test can be used) for the duration of the COVID-19 declaration under Section 564(b)(1) of the Act, 21 U.S.C. section 360bbb-3(b)(1), unless the authorization is terminated or revoked.  Performed at [facility], [address] [place], [place] [address]           Radiology Studies: DG Chest Independence 1 View  Result Date: 05/31/2021 CLINICAL DATA:  Follow-up left pneumothorax EXAM: PORTABLE CHEST 1 VIEW COMPARISON:  05/30/2021 FINDINGS: Heart size is normal. Minimal linear atelectasis at the left base. No left pneumothorax. Findings ester day related to a skin fold. Mild chronic elevation of the right hemidiaphragm with chronic volume loss at the right lung base. Loop recorder in place. Right IJ central line tips at the SVC RA junction and the SVC. IMPRESSION: No pneumothorax. Findings yesterday were artifactual. Minimal linear atelectasis at the bases. Electronically Signed   By: Nelson Chimes M.D.   On: 05/31/2021 07:24   DG Chest Port 1 View  Result Date: 05/30/2021 CLINICAL DATA:  Altered mental status EXAM: PORTABLE CHEST 1 VIEW COMPARISON:  Chest x-rays dated 05/16/2021 and 03/16/2021. FINDINGS: Suspected small pneumothorax on the LEFT, most suspicious appearance at the LEFT lung base. Chronic scarring/atelectasis at the RIGHT lung base. Heart size and mediastinal contours appear stable. RIGHT-sided dialysis catheter is  stable in position. Loop recorder overlies the LEFT lung base. Osseous structures about the chest are unremarkable. IMPRESSION: 1. Suspected small pneumothorax on the LEFT. Alternatively, this could represent a prominent skin fold (artifact). Consider short-term follow-up chest x-ray with less oblique patient positioning to confirm pneumothorax and/or to exclude enlarging pneumothorax. 2. No evidence of pneumonia. Critical Value/emergent results and recommendations were called by telephone at the time of interpretation on 05/30/2021 at 10:03 pm to patient's nurse, Bernadette Hoit, who verbally acknowledged these results. Electronically Signed   By: Franki Cabot M.D.   On: 05/30/2021 22:04        Scheduled Meds:  apixaban  2.5 mg Oral BID   ascorbic acid  500 mg Oral BID   atorvastatin  80 mg Oral Daily   carvedilol  6.25 mg Oral BID WC   Chlorhexidine Gluconate Cloth  6 each Topical Daily   Chlorhexidine Gluconate Cloth  6 each Topical Q0600   collagenase   Topical Daily   ezetimibe  10 mg Oral Daily   feeding supplement (NEPRO CARB STEADY)  237 mL Oral BID BM   ferrous gluconate  324 mg Oral Q1500   insulin aspart  0-9 Units Subcutaneous TID WC   levETIRAcetam  250 mg Oral Once per day on Mon Wed Fri   levETIRAcetam  500 mg Oral BID   losartan  100 mg Oral QHS   mouth rinse  15 mL Mouth Rinse BID   midodrine  10 mg Oral Once   mirtazapine  7.5 mg Oral QHS   multivitamin  1 tablet Oral QHS   risperiDONE  0.5 mg Oral QHS   Continuous Infusions:   LOS: 14 days    Time spent: 15 mins     Wyvonnia Dusky, MD [facility] Pager [phone]  If 7PM-7AM, please contact night-coverage 05/31/2021, 7:54 AM

## 2021-05-31 NOTE — Progress Notes (Signed)
[facility]  ROUNDING NOTE   Subjective:   Patient seen resting in bed, alert Breakfast tray at bedside Continues to be pleasantly confused  Received dialysis yesterday, tolerated well seated in chair   Objective:  Vital signs in last 24 hours:  Temp:  [97.6 F (36.4 C)-102.5 F (39.2 C)] 100.3 F (37.9 C) (11/29 0706) Pulse Rate:  [75-88] 82 (11/29 0706) Resp:  [15-28] 20 (11/28 2213) BP: (78-149)/(33-96) 106/51 (11/29 0706) SpO2:  [97 %-100 %] 97 % (11/29 0706)  Weight change:  Filed Weights   05/18/21 1545 05/27/21 1628  Weight: 60.4 kg 57.4 kg    Intake/Output: I/O last 3 completed shifts: In: -  Out: 307 [Other:307]   Intake/Output this shift:  No intake/output data recorded.  Physical Exam: General: no acute distress, laying in bed  Head: Normocephalic, atraumatic. Moist oral mucosal membranes  Lungs:  Clear to auscultation, normal effort  Heart: No rub or gallop  Abdomen:  Soft, nontender, nondistended  Extremities:  No peripheral edema.  Neurologic: Alert to self,  Left sided weakness.   Skin: No lesions  Access: Right IJ permcath    Basic Metabolic Panel: Recent Labs  Lab 05/25/21 0328 05/26/21 0405 05/27/21 0524 05/28/21 0551 05/29/21 0623 05/30/21 0654 05/31/21 0657  NA 133*   < > 133* 135 133* 133* 135  K 3.9   < > 3.9 4.2 4.1 4.7 3.8  CL 96*   < > 99 101 99 99 99  CO2 25   < > 23 24 23  21* 24  GLUCOSE 146*   < > 169* 207* 192* 186* 150*  BUN 57*   < > 63* 41* 60* 78* 43*  CREATININE 5.08*   < > 6.12* 4.16* 5.60* 6.71* 4.84*  CALCIUM 8.5*   < > 8.6* 8.7* 8.5* 8.7* 8.2*  PHOS 4.3  --   --   --   --   --   --    < > = values in this interval not displayed.     Liver Function Tests: Recent Labs  Lab 05/25/21 0328  ALBUMIN 3.4*    No results for input(s): LIPASE, AMYLASE in the last 168 hours. No results for input(s): AMMONIA in the last 168 hours.   CBC: Recent Labs  Lab 05/27/21 0524 05/28/21 0551  05/29/21 0623 05/30/21 0654 05/31/21 0657  WBC 4.6 13.8* 9.8 12.6* 14.7*  HGB 12.6 12.3 11.8* 12.3 11.2*  HCT 35.7* 35.0* 34.3* 35.7* 31.6*  MCV 84.8 85.0 84.3 83.6 83.6  PLT 127* 122* 123* 121* 115*     Cardiac Enzymes: No results for input(s): CKTOTAL, CKMB, CKMBINDEX, TROPONINI in the last 168 hours.  BNP: Invalid input(s): POCBNP  CBG: Recent Labs  Lab 05/30/21 1320 05/30/21 1627 05/30/21 1947 05/30/21 2159 05/31/21 0742  GLUCAP 124* 140* 128* 142* 136*     Microbiology: Results for orders placed or performed during the hospital encounter of 05/16/21  Resp Panel by RT-PCR (Flu A&B, Covid) Nasopharyngeal Swab     Status: None   Collection Time: 05/16/21 10:07 PM   Specimen: Nasopharyngeal Swab; Nasopharyngeal(NP) swabs in vial transport medium  Result Value Ref Range Status   SARS Coronavirus 2 by RT PCR NEGATIVE NEGATIVE Final    Comment: (NOTE) SARS-CoV-2 target nucleic acids are NOT DETECTED.  The SARS-CoV-2 RNA is generally detectable in upper respiratory specimens during the acute phase of infection. The lowest concentration of SARS-CoV-2 viral copies this assay can detect is 138 copies/mL. A negative result  does not preclude SARS-Cov-2 infection and should not be used as the sole basis for treatment or other patient management decisions. A negative result may occur with  improper specimen collection/handling, submission of specimen other than nasopharyngeal swab, presence of viral mutation(s) within the areas targeted by this assay, and inadequate number of viral copies(<138 copies/mL). A negative result must be combined with clinical observations, patient history, and epidemiological information. The expected result is Negative.  Fact Sheet for Patients:  EntrepreneurPulse.com.au  Fact Sheet for Healthcare Providers:  IncredibleEmployment.be  This test is no t yet approved or cleared by the [place] FDA and   has been authorized for detection and/or diagnosis of SARS-CoV-2 by FDA under an Emergency Use Authorization (EUA). This EUA will remain  in effect (meaning this test can be used) for the duration of the COVID-19 declaration under Section 564(b)(1) of the Act, 21 U.S.C.section 360bbb-3(b)(1), unless the authorization is terminated  or revoked sooner.       Influenza A by PCR NEGATIVE NEGATIVE Final   Influenza B by PCR NEGATIVE NEGATIVE Final    Comment: (NOTE) The Xpert Xpress SARS-CoV-2/FLU/RSV plus assay is intended as an aid in the diagnosis of influenza from Nasopharyngeal swab specimens and should not be used as a sole basis for treatment. Nasal washings and aspirates are unacceptable for Xpert Xpress SARS-CoV-2/FLU/RSV testing.  Fact Sheet for Patients: EntrepreneurPulse.com.au  Fact Sheet for Healthcare Providers: IncredibleEmployment.be  This test is not yet approved or cleared by the [place] FDA and has been authorized for detection and/or diagnosis of SARS-CoV-2 by FDA under an Emergency Use Authorization (EUA). This EUA will remain in effect (meaning this test can be used) for the duration of the COVID-19 declaration under Section 564(b)(1) of the Act, 21 U.S.C. section 360bbb-3(b)(1), unless the authorization is terminated or revoked.  Performed at [facility], [address], [place], [place] [address]   Urine Culture     Status: Abnormal   Collection Time: 05/16/21 10:07 PM   Specimen: In/Out Cath Urine  Result Value Ref Range Status   Specimen Description   Final    IN/OUT CATH URINE Performed at [facility], [address], [place], [place] [address]    Special Requests   Final    NONE Performed at [facility], [address] [place], [place] [address]    Culture (A)  Final    >=100,000 COLONIES/mL GARDNERELLA VAGINALIS Standardized susceptibility testing for this organism is  not available. 80,000 COLONIES/mL DIPHTHEROIDS(CORYNEBACTERIUM SPECIES)    Report Status 05/19/2021 FINAL  Final  Resp Panel by RT-PCR (Flu A&B, Covid) Nasopharyngeal Swab     Status: None   Collection Time: 05/27/21 12:40 PM   Specimen: Nasopharyngeal Swab; Nasopharyngeal(NP) swabs in vial transport medium  Result Value Ref Range Status   SARS Coronavirus 2 by RT PCR NEGATIVE NEGATIVE Final    Comment: (NOTE) SARS-CoV-2 target nucleic acids are NOT DETECTED.  The SARS-CoV-2 RNA is generally detectable in upper respiratory specimens during the acute phase of infection. The lowest concentration of SARS-CoV-2 viral copies this assay can detect is 138 copies/mL. A negative result does not preclude SARS-Cov-2 infection and should not be used as the sole basis for treatment or other patient management decisions. A negative result may occur with  improper specimen collection/handling, submission of specimen other than nasopharyngeal swab, presence of viral mutation(s) within the areas targeted by this assay, and inadequate number of viral copies(<138 copies/mL). A negative result must be combined  with clinical observations, patient history, and epidemiological information. The expected result is Negative.  Fact Sheet for Patients:  EntrepreneurPulse.com.au  Fact Sheet for Healthcare Providers:  IncredibleEmployment.be  This test is no t yet approved or cleared by the [place] FDA and  has been authorized for detection and/or diagnosis of SARS-CoV-2 by FDA under an Emergency Use Authorization (EUA). This EUA will remain  in effect (meaning this test can be used) for the duration of the COVID-19 declaration under Section 564(b)(1) of the Act, 21 U.S.C.section 360bbb-3(b)(1), unless the authorization is terminated  or revoked sooner.       Influenza A by PCR NEGATIVE NEGATIVE Final   Influenza B by PCR NEGATIVE NEGATIVE Final    Comment:  (NOTE) The Xpert Xpress SARS-CoV-2/FLU/RSV plus assay is intended as an aid in the diagnosis of influenza from Nasopharyngeal swab specimens and should not be used as a sole basis for treatment. Nasal washings and aspirates are unacceptable for Xpert Xpress SARS-CoV-2/FLU/RSV testing.  Fact Sheet for Patients: EntrepreneurPulse.com.au  Fact Sheet for Healthcare Providers: IncredibleEmployment.be  This test is not yet approved or cleared by the [place] FDA and has been authorized for detection and/or diagnosis of SARS-CoV-2 by FDA under an Emergency Use Authorization (EUA). This EUA will remain in effect (meaning this test can be used) for the duration of the COVID-19 declaration under Section 564(b)(1) of the Act, 21 U.S.C. section 360bbb-3(b)(1), unless the authorization is terminated or revoked.  Performed at [facility], [address] [place], [place] [address]     Coagulation Studies: No results for input(s): LABPROT, INR in the last 72 hours.  Urinalysis: No results for input(s): COLORURINE, LABSPEC, PHURINE, GLUCOSEU, HGBUR, BILIRUBINUR, KETONESUR, PROTEINUR, UROBILINOGEN, NITRITE, LEUKOCYTESUR in the last 72 hours.  Invalid input(s): APPERANCEUR     Imaging: DG Chest Port 1 View  Result Date: 05/31/2021 CLINICAL DATA:  Follow-up left pneumothorax EXAM: PORTABLE CHEST 1 VIEW COMPARISON:  05/30/2021 FINDINGS: Heart size is normal. Minimal linear atelectasis at the left base. No left pneumothorax. Findings ester day related to a skin fold. Mild chronic elevation of the right hemidiaphragm with chronic volume loss at the right lung base. Loop recorder in place. Right IJ central line tips at the SVC RA junction and the SVC. IMPRESSION: No pneumothorax. Findings yesterday were artifactual. Minimal linear atelectasis at the bases. Electronically Signed   By: Nelson Chimes M.D.   On: 05/31/2021 07:24   DG Chest Port 1  View  Result Date: 05/30/2021 CLINICAL DATA:  Altered mental status EXAM: PORTABLE CHEST 1 VIEW COMPARISON:  Chest x-rays dated 05/16/2021 and 03/16/2021. FINDINGS: Suspected small pneumothorax on the LEFT, most suspicious appearance at the LEFT lung base. Chronic scarring/atelectasis at the RIGHT lung base. Heart size and mediastinal contours appear stable. RIGHT-sided dialysis catheter is stable in position. Loop recorder overlies the LEFT lung base. Osseous structures about the chest are unremarkable. IMPRESSION: 1. Suspected small pneumothorax on the LEFT. Alternatively, this could represent a prominent skin fold (artifact). Consider short-term follow-up chest x-ray with less oblique patient positioning to confirm pneumothorax and/or to exclude enlarging pneumothorax. 2. No evidence of pneumonia. Critical Value/emergent results and recommendations were called by telephone at the time of interpretation on 05/30/2021 at 10:03 pm to patient's nurse, Bernadette Hoit, who verbally acknowledged these results. Electronically Signed   By: Franki Cabot M.D.   On: 05/30/2021 22:04     Medications:      apixaban  2.5 mg Oral BID   ascorbic  acid  500 mg Oral BID   atorvastatin  80 mg Oral Daily   carvedilol  6.25 mg Oral BID WC   Chlorhexidine Gluconate Cloth  6 each Topical Daily   Chlorhexidine Gluconate Cloth  6 each Topical Q0600   collagenase   Topical Daily   ezetimibe  10 mg Oral Daily   feeding supplement (NEPRO CARB STEADY)  237 mL Oral BID BM   ferrous gluconate  324 mg Oral Q1500   insulin aspart  0-9 Units Subcutaneous TID WC   levETIRAcetam  250 mg Oral Once per day on Mon Wed Fri   levETIRAcetam  500 mg Oral BID   losartan  100 mg Oral QHS   mouth rinse  15 mL Mouth Rinse BID   mirtazapine  7.5 mg Oral QHS   multivitamin  1 tablet Oral QHS   risperiDONE  0.5 mg Oral QHS   acetaminophen **OR** acetaminophen, haloperidol lactate, labetalol, ondansetron **OR** ondansetron (ZOFRAN) IV,  risperiDONE, senna-docusate  Assessment/ Plan:  [name] is a 87 y.o. black female with end stage renal disease on hemodialysis, hypertension, CVA, vascular dementia, hyperlipidemia, diabetes mellitus type II, atrial fibrillation who is admitted to [facility] on 05/16/2021 for Confusion [R41.0] Agitation [R45.1]   [facility] MWF [facility] [address] RIJ permcath 65.5kg   End Stage Renal Disease requiring hemodialysis:  -Received dialysis yesterday, seated in chair.  UF goal reduced to 300 mL due to low blood pressure.  Plan to dialyze tomorrow to maintain schedule.  Dialysis coordinator working on outpatient center transfer to [facility] in [place].   Altered mental status: Patient with history of chronic right MCA stroke and chronic atrophy.  Abnormal EEG. Levetiracetam at neurology recommendations of 500mg  twice daily with additional 250 mg dose after dialysis.  Pleasantly confused today    Hypertension: with hypertension urgency on admission. Home regimen includes losartan, amlodipine, carvedilol, hydralazine.   - IV labatelol PRN  -Managed with losartan, carvedilol    Anemia of chronic kidney disease:  Lab Results  Component Value Date   HGB 11.2 (L) 05/31/2021    We will continue monitoring hemoglobin  Secondary Hyperparathyroidism:  Lab Results  Component Value Date   PTH 83 (H) 01/06/2016   CALCIUM 8.2 (L) 05/31/2021   CAION 1.04 (L) 02/24/2021   PHOS 4.3 05/25/2021     Monitoring bone minerals during this admission   LOS: [place] 11/29/20229:46 AM

## 2021-05-31 NOTE — Progress Notes (Signed)
Physical Therapy Treatment Patient Details Name: Brianna Dodson MRN: [id number] DOB: 1933-07-30 Today's Date: 05/31/2021   History of Present Illness Pt is an 87 y/o F with PMH: Alzheimer's dementia with vascular dementia and multiple strokes including R MCA March 2022 with residual L side weakness and reportedly worsened L side weakess after AV fistual sx on 02/24/21(Dr. Dew), then underwent ligation of the L UE HD site on 8/27 to improve perfusion. Pt presents this admission d/t agitation and altered mental status x3 days with family ultimately bringing pt to ED when pt became agitated at HD tx. Pt adm and tx for Acute encephalopathy (suspected metabolic) with malignant hypertension and acute delerium of unclear etiology.    PT Comments    Pt finishing with OT session upon PT arrival.  Mod to max assist with bed mobility and pt briefly SBA static sitting midline before leaning towards R onto R elbow (unable to encourage pt to maintain static sitting balance d/t pt appearing to prefer to lean onto R elbow).  Pt appearing impulsive and although pt wanting to walk to bathroom therapist unable to position pt (and have pt initiate to assist) to perform mobility.  Pt fatigued sitting edge of bed and requested back to bed so therapist then assisted pt back to bed.  Will continue to progress pt with functional mobility as able.   Recommendations for follow up therapy are one component of a multi-disciplinary discharge planning process, led by the attending physician.  Recommendations may be updated based on patient status, additional functional criteria and insurance authorization.  Follow Up Recommendations  Skilled nursing-short term rehab (<3 hours/day)     Assistance Recommended at Discharge Frequent or constant Supervision/Assistance  Equipment Recommendations  Other (comment);Hospital bed;Wheelchair (measurements PT);Wheelchair cushion (measurements PT);BSC/3in1 (gait belt)    Recommendations for  Other Services       Precautions / Restrictions Precautions Precautions: Fall Restrictions Weight Bearing Restrictions: No     Mobility  Bed Mobility Overal bed mobility: Needs Assistance Bed Mobility: Supine to Sit;Sit to Supine     Supine to sit: Mod assist;Max assist;HOB elevated Sit to supine: Mod assist;Max assist;HOB elevated   General bed mobility comments: assist for trunk and B LE's supine to/from sitting; pt able to bridge (with bed in trendelenburg position and feet held) to scoot towards HOB end of session    Transfers           General transfer comment: pt kept leaning towards R side in sitting and unable to position pt to attempt to stand (although pt reporting wanting to stand to walk to bathroom)    Ambulation/Gait               General Gait Details: unable to stand to attempt   Stairs             Wheelchair Mobility    Modified Rankin (Stroke Patients Only)       Balance Overall balance assessment: Needs assistance Sitting-balance support: Bilateral upper extremity supported;Feet supported Sitting balance-Leahy Scale: Poor Sitting balance - Comments: pt kept leaning onto R elbow in sitting (briefly able to perform static sitting in neutral position) Postural control: Right lateral lean                                Cognition Arousal/Alertness: Awake/alert Behavior During Therapy: Impulsive Overall Cognitive Status: History of cognitive impairments - at baseline  General Comments: A&O x2; decreased awareness of deficits        Exercises      General Comments  Nursing cleared pt for participation in physical therapy.  Pt agreeable to PT session.       Pertinent Vitals/Pain Pain Assessment: Faces Pain Score: 4  Faces Pain Scale: Hurts whole lot Pain Location: general discomfort Pain Descriptors / Indicators: Discomfort Pain Intervention(s): Limited activity  within patient's tolerance;Monitored during session;Repositioned    Home Living                          Prior Function            PT Goals (current goals can now be found in the care plan section) Acute Rehab PT Goals Patient Stated Goal: to walk to bathroom PT Goal Formulation: With patient Progress towards PT goals: Progressing toward goals    Frequency    Min 2X/week      PT Plan Current plan remains appropriate    Co-evaluation              AM-PAC PT "6 Clicks" Mobility   Outcome Measure  Help needed turning from your back to your side while in a flat bed without using bedrails?: A Little Help needed moving from lying on your back to sitting on the side of a flat bed without using bedrails?: A Lot Help needed moving to and from a bed to a chair (including a wheelchair)?: A Lot Help needed standing up from a chair using your arms (e.g., wheelchair or bedside chair)?: A Lot Help needed to walk in hospital room?: Total Help needed climbing 3-5 steps with a railing? : Total 6 Click Score: 11    End of Session   Activity Tolerance: Patient tolerated treatment well Patient left: in bed;with call bell/phone within reach;with bed alarm set;Other (comment) (fall mat in place) Nurse Communication: Mobility status;Precautions PT Visit Diagnosis: Unsteadiness on feet (R26.81);Muscle weakness (generalized) (M62.81);History of falling (Z91.81);Difficulty in walking, not elsewhere classified (R26.2);Hemiplegia and hemiparesis Hemiplegia - Right/Left: Left Hemiplegia - dominant/non-dominant: Non-dominant     Time: 8916-9450 PT Time Calculation (min) (ACUTE ONLY): 17 min  Charges:  $Therapeutic Activity: 8-22 mins                    Leitha Bleak, PT 05/31/21, 5:29 PM

## 2021-05-31 NOTE — TOC Progression Note (Signed)
Transition of Care [facility]) - Progression Note    Patient Details  Name: Brianna Dodson MRN: [id number] Date of Birth: 1933-08-13  [facility] CM/SW Contact  Beverly Sessions, RN Phone Number: 05/31/2021, 12:31 PM  Clinical Narrative:    Not medically ready for discharge Febrile over night Blood cultures pending    Expected Discharge Plan: [place] Barriers to Discharge: Equipment Delay [facility] bed was not delived on 11/26 as arranged on 05/27/21. [employer] was contacted twice on 11/26 and it was to be prioritized as patient's needed EMS transport to home.)  Expected Discharge Plan and Services Expected Discharge Plan: [place] Acute Care Choice: Resumption of Svcs/PTA Provider Living arrangements for the past 2 months: Single Family Home                                       Social Determinants of Health (SDOH) Interventions    Readmission Risk Interventions Readmission Risk Prevention Plan 05/18/2021 03/03/2021 03/16/2019  Transportation Screening Complete Complete Complete  PCP or Specialist Appt within 5-7 Days - - Complete  PCP or Specialist Appt within 3-5 Days Complete Complete -  Home Care Screening - - Complete  Medication Review (RN CM) - - Complete  HRI or Home Care Consult Complete Complete -  Social Work Consult for [place] Planning/Counseling Complete Not Complete -  SW consult not completed comments - RNCM assigned to patient -  Palliative Care Screening Not Applicable Not Applicable -  Medication Review (RN Care Manager) Complete Complete -  Some recent data might be hidden

## 2021-05-31 NOTE — Evaluation (Signed)
Occupational Therapy Re-evaluation Patient Details Name: Brianna Dodson MRN: [id number] DOB: 1934-05-11 Today's Date: 05/31/2021   History of Present Illness Pt is an 87 y/o F with PMH: Alzheimer's dementia with vascular dementia and multiple strokes including R MCA March 2022 with residual L side weakness and reportedly worsened L side weakess after AV fistual sx on 02/24/21(Dr. Dew), then underwent ligation of the L UE HD site on 8/27 to improve perfusion. Pt presents this admission d/t agitation and altered mental status x3 days with family ultimately bringing pt to ED when pt became agitated at HD tx. Pt adm and tx for Acute encephalopathy (suspected metabolic) with malignant hypertension and acute delerium of unclear etiology.   Clinical Impression   Pt seen for OT re-evaluation on this date. Upon arrival to room, pt asleep in bed however easily awoken and agreeable OT tx. Pt A&Ox2 and reporting stomach pain and pain with any b/l LE movement. Pt currently presents with decreased cognition, decreased strength, decreased balance, and decreased activity tolerance. Due to these functional impairments, pt requires MOD A for bed-level grooming tasks, MIN A for bringing cup to mouth, MAX A for bed mobility, and MOD A for static sitting balance at EOB. After sitting EOB for 4 mins, pt requested to use bathroom. Following MAX A of 1-person, pt unable to clear hip from bed and pt reporting immediate urge for BM. Pt returned to supine and bedpan placed (pt unable to produce; RN informed). Pt left in bed, in no acute distress with PT. Pt continues to benefit from skilled OT services to maximize return to PLOF and minimize risk of future falls, injury, caregiver burden, and readmission. Goals and discharge plan remain appropriate.   Recommendations for follow up therapy are one component of a multi-disciplinary discharge planning process, led by the attending physician.  Recommendations may be updated based on  patient status, additional functional criteria and insurance authorization.   Follow Up Recommendations  Skilled nursing-short term rehab (<3 hours/day)    Assistance Recommended at Discharge Frequent or constant Supervision/Assistance  Functional Status Assessment  Patient has had a recent decline in their functional status and/or demonstrates limited ability to make significant improvements in function in a reasonable and predictable amount of time  Equipment Recommendations  Other (comment) (defer to next venue of care)       Precautions / Restrictions Precautions Precautions: Fall Restrictions Weight Bearing Restrictions: No      Mobility Bed Mobility Overal bed mobility: Needs Assistance Bed Mobility: Supine to Sit;Sit to Supine     Supine to sit: Max assist;HOB elevated Sit to supine: Max assist   General bed mobility comments: Requires MAX A for managing trunk and LE. Following sit>supine, pt able to bridge (with bed placed in trendelenburg position and feet held) to bring head toward HOB.    Transfers Overall transfer level: Needs assistance Equipment used: Rolling walker (2 wheels) Transfers: Sit to/from Stand Sit to Stand: Max assist           General transfer comment: Pt unable to clear hips from bed following MAX A of 1-person      Balance Overall balance assessment: Needs assistance Sitting-balance support: Feet supported;Single extremity supported Sitting balance-Leahy Scale: Poor Sitting balance - Comments: Requires MIN-MOD A throughout for static sitting balance with R UE supported     Standing balance-Leahy Scale: Zero  ADL either performed or assessed with clinical judgement   ADL Overall ADL's : Needs assistance/impaired Eating/Feeding: Minimal assistance;Bed level Eating/Feeding Details (indicate cue type and reason): MIN A to place cup in hand, with pt able to bring to mouh with increased  time/effort Grooming: Wash/dry hands;Moderate assistance;Bed level Grooming Details (indicate cue type and reason): Pt able to hold washcloth with RUE to make attempts to wash LUE, however requires physical assist to wash RUE and to ensure thoroughness                     Toileting- Clothing Manipulation and Hygiene: Maximal assistance;Bed level Toileting - Clothing Manipulation Details (indicate cue type and reason): Pt reports urge for BM, but unable to produce on bedpin. Recievied Max A for posterior peri-care             Vision   Vision Assessment?: Vision impaired- to be further tested in functional context Additional Comments: no noticeable tracking            Pertinent Vitals/Pain Pain Assessment: Faces Faces Pain Scale: Hurts whole lot Breathing: normal Negative Vocalization: none Facial Expression: smiling or inexpressive Body Language: relaxed Consolability: no need to console PAINAD Score: 0 Pain Location: Stomach, b/l LE with any movement Pain Descriptors / Indicators: Grimacing;Crying;Discomfort Pain Intervention(s): Limited activity within patient's tolerance;Monitored during session;Repositioned              Cognition Arousal/Alertness: Awake/alert Behavior During Therapy: WFL for tasks assessed/performed;Impulsive Overall Cognitive Status: History of cognitive impairments - at baseline                                 General Comments: A&Ox2. Requires verbal/tactile cues for attention. Demonstrates decreased awareness of deficits           OT Problem List: Decreased strength;Decreased range of motion;Decreased activity tolerance;Impaired balance (sitting and/or standing);Decreased coordination;Decreased cognition;Decreased safety awareness;Decreased knowledge of use of DME or AE;Decreased knowledge of precautions      OT Treatment/Interventions: Self-care/ADL training;Therapeutic exercise;Therapeutic activities    OT  Goals(Current goals can be found in the care plan section) Acute Rehab OT Goals Patient Stated Goal: to go home OT Goal Formulation: With patient Time For Goal Achievement: 06/14/21 Potential to Achieve Goals: Fair  OT Frequency: Min 1X/week    AM-PAC OT "6 Clicks" Daily Activity     Outcome Measure Help from another person eating meals?: A Lot Help from another person taking care of personal grooming?: A Lot Help from another person toileting, which includes using toliet, bedpan, or urinal?: A Lot Help from another person bathing (including washing, rinsing, drying)?: A Lot Help from another person to put on and taking off regular upper body clothing?: A Lot Help from another person to put on and taking off regular lower body clothing?: A Lot 6 Click Score: 12   End of Session Equipment Utilized During Treatment: Gait belt;Rolling walker (2 wheels) Nurse Communication: Mobility status  Activity Tolerance: Patient tolerated treatment well Patient left: in bed;with call bell/phone within reach;with bed alarm set  OT Visit Diagnosis: Unsteadiness on feet (R26.81);Other abnormalities of gait and mobility (R26.89);Feeding difficulties (R63.3)                Time: 5573-2202 OT Time Calculation (min): 34 min Charges:  OT General Charges $OT Visit: 1 Visit OT Evaluation $OT Re-eval: 1 Re-eval OT Treatments $Self Care/Home Management : 23-37 mins  Dionicia Cerritos D  [place], [place]

## 2021-06-01 DIAGNOSIS — B957 Other staphylococcus as the cause of diseases classified elsewhere: Secondary | ICD-10-CM

## 2021-06-01 DIAGNOSIS — R451 Restlessness and agitation: Secondary | ICD-10-CM | POA: Diagnosis not present

## 2021-06-01 DIAGNOSIS — R7881 Bacteremia: Secondary | ICD-10-CM

## 2021-06-01 LAB — BASIC METABOLIC PANEL
Anion gap: 12 (ref 5–15)
BUN: 67 mg/dL — ABNORMAL HIGH (ref 8–23)
CO2: 23 mmol/L (ref 22–32)
Calcium: 8.3 mg/dL — ABNORMAL LOW (ref 8.9–10.3)
Chloride: 97 mmol/L — ABNORMAL LOW (ref 98–111)
Creatinine, Ser: 6.28 mg/dL — ABNORMAL HIGH (ref 0.44–1.00)
GFR, Estimated: 6 mL/min — ABNORMAL LOW (ref >60)
Glucose, Bld: 148 mg/dL — ABNORMAL HIGH (ref 70–99)
Potassium: 4 mmol/L (ref 3.5–5.1)
Sodium: 132 mmol/L — ABNORMAL LOW (ref 135–145)

## 2021-06-01 LAB — BLOOD CULTURE ID PANEL (REFLEXED) - BCID2

## 2021-06-01 LAB — CBC
HCT: 31.5 % — ABNORMAL LOW (ref 36.0–46.0)
Hemoglobin: 11 g/dL — ABNORMAL LOW (ref 12.0–15.0)
MCH: 29.3 pg (ref 26.0–34.0)
MCHC: 34.9 g/dL (ref 30.0–36.0)
MCV: 84 fL (ref 80.0–100.0)
Platelets: 117 10*3/uL — ABNORMAL LOW (ref 150–400)
RBC: 3.75 MIL/uL — ABNORMAL LOW (ref 3.87–5.11)
RDW: 13.6 % (ref 11.5–15.5)
WBC: 15.1 10*3/uL — ABNORMAL HIGH (ref 4.0–10.5)
nRBC: 0 % (ref 0.0–0.2)

## 2021-06-01 LAB — GLUCOSE, CAPILLARY
Glucose-Capillary: 205 mg/dL — ABNORMAL HIGH (ref 70–99)
Glucose-Capillary: 158 mg/dL — ABNORMAL HIGH (ref 70–99)
Glucose-Capillary: 115 mg/dL — ABNORMAL HIGH (ref 70–99)
Glucose-Capillary: 170 mg/dL — ABNORMAL HIGH (ref 70–99)

## 2021-06-01 MED ORDER — CEFAZOLIN SODIUM-DEXTROSE 2-4 GM/100ML-% IV SOLN
2.0000 g | Freq: Once | INTRAVENOUS | Status: AC
  Administered 2021-06-01: 2 g via INTRAVENOUS
  Filled 2021-06-01: qty 100

## 2021-06-01 MED ORDER — HEPARIN SODIUM (PORCINE) 1000 UNIT/ML IJ SOLN
INTRAMUSCULAR | Status: AC
  Filled 2021-06-01: qty 10

## 2021-06-01 MED ORDER — ACETAMINOPHEN 325 MG PO TABS
ORAL_TABLET | ORAL | Status: AC
  Filled 2021-06-01: qty 2

## 2021-06-01 MED ORDER — ENSURE ENLIVE PO LIQD
237.0000 mL | Freq: Three times a day (TID) | ORAL | Status: DC
Start: 2021-06-01 — End: 2021-06-05
  Administered 2021-06-01 – 2021-06-02 (×4): 237 mL via ORAL

## 2021-06-01 MED ORDER — CEFAZOLIN SODIUM-DEXTROSE 1-4 GM/50ML-% IV SOLN
1.0000 g | Freq: Every day | INTRAVENOUS | Status: DC
  Administered 2021-06-02: 1 g via INTRAVENOUS
  Filled 2021-06-01 (×2): qty 50

## 2021-06-01 NOTE — Progress Notes (Signed)
[facility]  ROUNDING NOTE   Subjective:   Patient seen sitting up in bed Receiving assistance with breakfast No complaints at this time  Dialysis scheduled for later today   Objective:  Vital signs in last 24 hours:  Temp:  [97.8 F (36.6 C)-98.6 F (37 C)] 97.8 F (36.6 C) (11/30 0747) Pulse Rate:  [73-95] 73 (11/30 0747) Resp:  [18-20] 20 (11/30 0747) BP: (87-135)/(49-61) 127/55 (11/30 0747) SpO2:  [96 %-100 %] 100 % (11/30 0747)  Weight change:  Filed Weights   05/18/21 1545 05/27/21 1628  Weight: 60.4 kg 57.4 kg    Intake/Output: I/O last 3 completed shifts: In: 80 [P.O.:80] Out: 1 [Urine:1]   Intake/Output this shift:  No intake/output data recorded.  Physical Exam: General: no acute distress  Head: Normocephalic, atraumatic. Moist oral mucosal membranes  Lungs:  Clear to auscultation, normal effort  Heart: No rub or gallop  Abdomen:  Soft, nontender, nondistended  Extremities:  No peripheral edema.  Neurologic: Alert to self,  Left sided weakness.   Skin: No lesions  Access: Right IJ permcath    Basic Metabolic Panel: Recent Labs  Lab 05/27/21 0524 05/28/21 0551 05/29/21 0623 05/30/21 0654 05/31/21 0657  NA 133* 135 133* 133* 135  K 3.9 4.2 4.1 4.7 3.8  CL 99 101 99 99 99  CO2 23 24 23  21* 24  GLUCOSE 169* 207* 192* 186* 150*  BUN 63* 41* 60* 78* 43*  CREATININE 6.12* 4.16* 5.60* 6.71* 4.84*  CALCIUM 8.6* 8.7* 8.5* 8.7* 8.2*     Liver Function Tests: No results for input(s): AST, ALT, ALKPHOS, BILITOT, PROT, ALBUMIN in the last 168 hours.  No results for input(s): LIPASE, AMYLASE in the last 168 hours. No results for input(s): AMMONIA in the last 168 hours.   CBC: Recent Labs  Lab 05/27/21 0524 05/28/21 0551 05/29/21 0623 05/30/21 0654 05/31/21 0657  WBC 4.6 13.8* 9.8 12.6* 14.7*  HGB 12.6 12.3 11.8* 12.3 11.2*  HCT 35.7* 35.0* 34.3* 35.7* 31.6*  MCV 84.8 85.0 84.3 83.6 83.6  PLT 127* 122* 123* 121* 115*      Cardiac Enzymes: No results for input(s): CKTOTAL, CKMB, CKMBINDEX, TROPONINI in the last 168 hours.  BNP: Invalid input(s): POCBNP  CBG: Recent Labs  Lab 05/31/21 0742 05/31/21 1141 05/31/21 1619 05/31/21 2143 06/01/21 0743  GLUCAP 136* 198* 236* 216* 205*     Microbiology: Results for orders placed or performed during the hospital encounter of 05/16/21  Resp Panel by RT-PCR (Flu A&B, Covid) Nasopharyngeal Swab     Status: None   Collection Time: 05/16/21 10:07 PM   Specimen: Nasopharyngeal Swab; Nasopharyngeal(NP) swabs in vial transport medium  Result Value Ref Range Status   SARS Coronavirus 2 by RT PCR NEGATIVE NEGATIVE Final    Comment: (NOTE) SARS-CoV-2 target nucleic acids are NOT DETECTED.  The SARS-CoV-2 RNA is generally detectable in upper respiratory specimens during the acute phase of infection. The lowest concentration of SARS-CoV-2 viral copies this assay can detect is 138 copies/mL. A negative result does not preclude SARS-Cov-2 infection and should not be used as the sole basis for treatment or other patient management decisions. A negative result may occur with  improper specimen collection/handling, submission of specimen other than nasopharyngeal swab, presence of viral mutation(s) within the areas targeted by this assay, and inadequate number of viral copies(<138 copies/mL). A negative result must be combined with clinical observations, patient history, and epidemiological information. The expected result is Negative.  Fact Sheet  for Patients:  EntrepreneurPulse.com.au  Fact Sheet for Healthcare Providers:  IncredibleEmployment.be  This test is no t yet approved or cleared by the [place] FDA and  has been authorized for detection and/or diagnosis of SARS-CoV-2 by FDA under an Emergency Use Authorization (EUA). This EUA will remain  in effect (meaning this test can be used) for the duration of  the COVID-19 declaration under Section 564(b)(1) of the Act, 21 U.S.C.section 360bbb-3(b)(1), unless the authorization is terminated  or revoked sooner.       Influenza A by PCR NEGATIVE NEGATIVE Final   Influenza B by PCR NEGATIVE NEGATIVE Final    Comment: (NOTE) The Xpert Xpress SARS-CoV-2/FLU/RSV plus assay is intended as an aid in the diagnosis of influenza from Nasopharyngeal swab specimens and should not be used as a sole basis for treatment. Nasal washings and aspirates are unacceptable for Xpert Xpress SARS-CoV-2/FLU/RSV testing.  Fact Sheet for Patients: EntrepreneurPulse.com.au  Fact Sheet for Healthcare Providers: IncredibleEmployment.be  This test is not yet approved or cleared by the [place] FDA and has been authorized for detection and/or diagnosis of SARS-CoV-2 by FDA under an Emergency Use Authorization (EUA). This EUA will remain in effect (meaning this test can be used) for the duration of the COVID-19 declaration under Section 564(b)(1) of the Act, 21 U.S.C. section 360bbb-3(b)(1), unless the authorization is terminated or revoked.  Performed at [facility], [address] [place], [place] [address]   Urine Culture     Status: Abnormal   Collection Time: 05/16/21 10:07 PM   Specimen: In/Out Cath Urine  Result Value Ref Range Status   Specimen Description   Final    IN/OUT CATH URINE Performed at [facility], [address], [place], [place] [address]    Special Requests   Final    NONE Performed at [facility], [address] [place], [place] [address]    Culture (A)  Final    >=100,000 COLONIES/mL GARDNERELLA VAGINALIS Standardized susceptibility testing for this organism is not available. 80,000 COLONIES/mL DIPHTHEROIDS(CORYNEBACTERIUM SPECIES)    Report Status 05/19/2021 FINAL  Final  Resp Panel by RT-PCR (Flu A&B, Covid) Nasopharyngeal Swab     Status: None    Collection Time: 05/27/21 12:40 PM   Specimen: Nasopharyngeal Swab; Nasopharyngeal(NP) swabs in vial transport medium  Result Value Ref Range Status   SARS Coronavirus 2 by RT PCR NEGATIVE NEGATIVE Final    Comment: (NOTE) SARS-CoV-2 target nucleic acids are NOT DETECTED.  The SARS-CoV-2 RNA is generally detectable in upper respiratory specimens during the acute phase of infection. The lowest concentration of SARS-CoV-2 viral copies this assay can detect is 138 copies/mL. A negative result does not preclude SARS-Cov-2 infection and should not be used as the sole basis for treatment or other patient management decisions. A negative result may occur with  improper specimen collection/handling, submission of specimen other than nasopharyngeal swab, presence of viral mutation(s) within the areas targeted by this assay, and inadequate number of viral copies(<138 copies/mL). A negative result must be combined with clinical observations, patient history, and epidemiological information. The expected result is Negative.  Fact Sheet for Patients:  EntrepreneurPulse.com.au  Fact Sheet for Healthcare Providers:  IncredibleEmployment.be  This test is no t yet approved or cleared by the [place] FDA and  has been authorized for detection and/or diagnosis of SARS-CoV-2 by FDA under an Emergency Use Authorization (EUA). This EUA will remain  in effect (meaning this test can be used) for the duration  of the COVID-19 declaration under Section 564(b)(1) of the Act, 21 U.S.C.section 360bbb-3(b)(1), unless the authorization is terminated  or revoked sooner.       Influenza A by PCR NEGATIVE NEGATIVE Final   Influenza B by PCR NEGATIVE NEGATIVE Final    Comment: (NOTE) The Xpert Xpress SARS-CoV-2/FLU/RSV plus assay is intended as an aid in the diagnosis of influenza from Nasopharyngeal swab specimens and should not be used as a sole basis for treatment.  Nasal washings and aspirates are unacceptable for Xpert Xpress SARS-CoV-2/FLU/RSV testing.  Fact Sheet for Patients: EntrepreneurPulse.com.au  Fact Sheet for Healthcare Providers: IncredibleEmployment.be  This test is not yet approved or cleared by the [place] FDA and has been authorized for detection and/or diagnosis of SARS-CoV-2 by FDA under an Emergency Use Authorization (EUA). This EUA will remain in effect (meaning this test can be used) for the duration of the COVID-19 declaration under Section 564(b)(1) of the Act, 21 U.S.C. section 360bbb-3(b)(1), unless the authorization is terminated or revoked.  Performed at [facility], [address] [place], [place] [address]   CULTURE, BLOOD (ROUTINE X 2) w Reflex to ID Panel     Status: None (Preliminary result)   Collection Time: 05/31/21 10:21 AM   Specimen: BLOOD  Result Value Ref Range Status   Specimen Description BLOOD BLOOD LEFT HAND  Final   Special Requests   Final    BOTTLES DRAWN AEROBIC AND ANAEROBIC Blood Culture adequate volume   Culture   Final    AEROBIC BOTTLE ONLY GRAM POSITIVE COCCI CRITICAL RESULT CALLED TO, READ BACK BY AND VERIFIED WITH: [facility] MITCHELL 06/01/21 [id number] KLW Performed at [facility], [address] [place], [place] [address]    Report Status PENDING  Incomplete  CULTURE, BLOOD (ROUTINE X 2) w Reflex to ID Panel     Status: None (Preliminary result)   Collection Time: 05/31/21 11:34 AM   Specimen: BLOOD  Result Value Ref Range Status   Specimen Description BLOOD BLOOD LEFT HAND  Final   Special Requests   Final    BOTTLES DRAWN AEROBIC AND ANAEROBIC Blood Culture adequate volume   Culture  Setup Time   Final    GRAM POSITIVE COCCI AEROBIC BOTTLE ONLY Organism ID to follow CRITICAL RESULT CALLED TO, READ BACK BY AND VERIFIED WITH: DEVON MITCHELL 06/01/21 2641 KLW Performed at [facility], [address]  [place],  [address]    Culture GRAM POSITIVE COCCI  Final   Report Status PENDING  Incomplete  Blood Culture ID Panel (Reflexed)     Status: Abnormal   Collection Time: 05/31/21 11:34 AM  Result Value Ref Range Status   Enterococcus faecalis NOT DETECTED NOT DETECTED Final   Enterococcus Faecium NOT DETECTED NOT DETECTED Final   Listeria monocytogenes NOT DETECTED NOT DETECTED Final   Staphylococcus species DETECTED (A) NOT DETECTED Final    Comment: CRITICAL RESULT CALLED TO, READ BACK BY AND VERIFIED WITH: DEVAN MITCHELL 06/01/21 0928 KLW    Staphylococcus aureus (BCID) NOT DETECTED NOT DETECTED Final   Staphylococcus epidermidis DETECTED (A) NOT DETECTED Final    Comment: CRITICAL RESULT CALLED TO, READ BACK BY AND VERIFIED WITH: DEVAN MITCHELL 06/01/21 0928 KLW    Staphylococcus lugdunensis NOT DETECTED NOT DETECTED Final   Streptococcus species NOT DETECTED NOT DETECTED Final   Streptococcus agalactiae NOT DETECTED NOT DETECTED Final   Streptococcus pneumoniae NOT DETECTED NOT DETECTED Final   Streptococcus pyogenes NOT DETECTED NOT DETECTED Final  A.calcoaceticus-baumannii NOT DETECTED NOT DETECTED Final   Bacteroides fragilis NOT DETECTED NOT DETECTED Final   Enterobacterales NOT DETECTED NOT DETECTED Final   Enterobacter cloacae complex NOT DETECTED NOT DETECTED Final   Escherichia coli NOT DETECTED NOT DETECTED Final   Klebsiella aerogenes NOT DETECTED NOT DETECTED Final   Klebsiella oxytoca NOT DETECTED NOT DETECTED Final   Klebsiella pneumoniae NOT DETECTED NOT DETECTED Final   Proteus species NOT DETECTED NOT DETECTED Final   Salmonella species NOT DETECTED NOT DETECTED Final   Serratia marcescens NOT DETECTED NOT DETECTED Final   Haemophilus influenzae NOT DETECTED NOT DETECTED Final   Neisseria meningitidis NOT DETECTED NOT DETECTED Final   Pseudomonas aeruginosa NOT DETECTED NOT DETECTED Final   Stenotrophomonas maltophilia NOT DETECTED NOT DETECTED Final    Candida albicans NOT DETECTED NOT DETECTED Final   Candida auris NOT DETECTED NOT DETECTED Final   Candida glabrata NOT DETECTED NOT DETECTED Final   Candida krusei NOT DETECTED NOT DETECTED Final   Candida parapsilosis NOT DETECTED NOT DETECTED Final   Candida tropicalis NOT DETECTED NOT DETECTED Final   Cryptococcus neoformans/gattii NOT DETECTED NOT DETECTED Final   Methicillin resistance mecA/C NOT DETECTED NOT DETECTED Final    Comment: Performed at [facility], [address] [place], [place] [address]    Coagulation Studies: No results for input(s): LABPROT, INR in the last 72 hours.  Urinalysis: No results for input(s): COLORURINE, LABSPEC, PHURINE, GLUCOSEU, HGBUR, BILIRUBINUR, KETONESUR, PROTEINUR, UROBILINOGEN, NITRITE, LEUKOCYTESUR in the last 72 hours.  Invalid input(s): APPERANCEUR     Imaging: DG Chest Port 1 View  Result Date: 05/31/2021 CLINICAL DATA:  Follow-up left pneumothorax EXAM: PORTABLE CHEST 1 VIEW COMPARISON:  05/30/2021 FINDINGS: Heart size is normal. Minimal linear atelectasis at the left base. No left pneumothorax. Findings ester day related to a skin fold. Mild chronic elevation of the right hemidiaphragm with chronic volume loss at the right lung base. Loop recorder in place. Right IJ central line tips at the SVC RA junction and the SVC. IMPRESSION: No pneumothorax. Findings yesterday were artifactual. Minimal linear atelectasis at the bases. Electronically Signed   By: Nelson Chimes M.D.   On: 05/31/2021 07:24   DG Chest Port 1 View  Result Date: 05/30/2021 CLINICAL DATA:  Altered mental status EXAM: PORTABLE CHEST 1 VIEW COMPARISON:  Chest x-rays dated 05/16/2021 and 03/16/2021. FINDINGS: Suspected small pneumothorax on the LEFT, most suspicious appearance at the LEFT lung base. Chronic scarring/atelectasis at the RIGHT lung base. Heart size and mediastinal contours appear stable. RIGHT-sided dialysis catheter is stable in position. Loop  recorder overlies the LEFT lung base. Osseous structures about the chest are unremarkable. IMPRESSION: 1. Suspected small pneumothorax on the LEFT. Alternatively, this could represent a prominent skin fold (artifact). Consider short-term follow-up chest x-ray with less oblique patient positioning to confirm pneumothorax and/or to exclude enlarging pneumothorax. 2. No evidence of pneumonia. Critical Value/emergent results and recommendations were called by telephone at the time of interpretation on 05/30/2021 at 10:03 pm to patient's nurse, Bernadette Hoit, who verbally acknowledged these results. Electronically Signed   By: Franki Cabot M.D.   On: 05/30/2021 22:04     Medications:      apixaban  2.5 mg Oral BID   ascorbic acid  500 mg Oral BID   atorvastatin  80 mg Oral Daily   carvedilol  6.25 mg Oral BID WC   Chlorhexidine Gluconate Cloth  6 each Topical Daily   Chlorhexidine Gluconate Cloth  6 each Topical Q0600  collagenase   Topical Daily   docusate sodium  200 mg Oral BID   ezetimibe  10 mg Oral Daily   feeding supplement (NEPRO CARB STEADY)  237 mL Oral BID BM   ferrous gluconate  324 mg Oral Q1500   insulin aspart  0-9 Units Subcutaneous TID WC   levETIRAcetam  250 mg Oral Once per day on Mon Wed Fri   levETIRAcetam  500 mg Oral BID   losartan  100 mg Oral QHS   mouth rinse  15 mL Mouth Rinse BID   mirtazapine  7.5 mg Oral QHS   multivitamin  1 tablet Oral QHS   polyethylene glycol  17 g Oral Daily   risperiDONE  0.5 mg Oral QHS   acetaminophen **OR** acetaminophen, haloperidol lactate, labetalol, ondansetron **OR** ondansetron (ZOFRAN) IV, risperiDONE, senna-docusate  Assessment/ Plan:  [name] is a 87 y.o. black female with end stage renal disease on hemodialysis, hypertension, CVA, vascular dementia, hyperlipidemia, diabetes mellitus type II, atrial fibrillation who is admitted to [facility] on 05/16/2021 for Confusion [R41.0] Agitation [R45.1]   [facility] MWF [facility] [address]  RIJ permcath 65.5kg   End Stage Renal Disease requiring hemodialysis:  -Scheduled for dialysis today. Dialysis coordinator working on outpatient center transfer to [facility] in [place].   Altered mental status: Patient with history of chronic right MCA stroke and chronic atrophy.  Abnormal EEG. Levetiracetam at neurology recommendations of 500mg  twice daily with additional 250 mg dose after dialysis.     Hypertension: with hypertension urgency on admission. Home regimen includes losartan, amlodipine, carvedilol, hydralazine.   - IV labatelol PRN  -Managed with losartan, carvedilol  - BP stable   Anemia of chronic kidney disease:  Lab Results  Component Value Date   HGB 11.2 (L) 05/31/2021    Hgb stable  Secondary Hyperparathyroidism:  Lab Results  Component Value Date   PTH 83 (H) 01/06/2016   CALCIUM 8.2 (L) 05/31/2021   CAION 1.04 (L) 02/24/2021   PHOS 4.3 05/25/2021     Monitoring bone minerals during this admission   LOS: [place] 11/30/202210:07 AM

## 2021-06-01 NOTE — Plan of Care (Signed)

## 2021-06-01 NOTE — Progress Notes (Signed)
Initial Nutrition Assessment  DOCUMENTATION CODES:   Non-severe (moderate) malnutrition in context of chronic illness  INTERVENTION:   Ensure Enlive po TID, each supplement provides 350 kcal and 20 grams of protein  Magic cup TID with meals, each supplement provides 290 kcal and 9 grams of protein  Rena-vit po daily   Vitamin C $RemoveB'500mg'hLAbVxSt$  po BID  Dysphagia 3 diet   Pt at moderate refeed risk; recommend monitor potassium, magnesium and phosphorus labs daily until stable  NUTRITION DIAGNOSIS:   Moderate Malnutrition related to chronic illness (ESRD on HD, dementia, CVA) as evidenced by mild fat depletion, moderate fat depletion, moderate muscle depletion.  GOAL:   Patient will meet greater than or equal to 90% of their needs  MONITOR:   PO intake, Supplement acceptance, Labs, Weight trends, Skin, I & O's  REASON FOR ASSESSMENT:   Consult Assessment of nutrition requirement/status  ASSESSMENT:   87 y.o. black female with end stage renal disease on hemodialysis, hypertension, CVA, vascular dementia, hyperlipidemia, diabetes mellitus type II and atrial fibrillation who is admitted to [facility] on 05/16/2021 for confusion  Met with pt in room today. Pt unable to provide much nutrition related history r/t dementia. Pt is able to report that she does not feel well today. Pt does report that she is hungry but is only documented to have eaten sips/bites of breakfast and she did not touch her lunch tray. Pt reports that she does not like Nepro; pt reports that she likes chocolate Ensure. RD will add supplements and vitamins to help pt meet her estimated needs and to support wound healing. Per chart, pt is down 19lbs(13%) over the past month; RD unsure if bed weights are correct.    Medications reviewed and include: vitamin C, colace, ferrous gluconate, insulin, remeron, rena-vit, miralax, cefazolin  Labs reviewed: Na 132(L), K 4.0 wnl, BUN 67(H), creat 6.28(H) Wbc- 15.1(H) Cbgs- 158, 205 x  24 hrs AIC 5.5- 11/15  NUTRITION - FOCUSED PHYSICAL EXAM:  Flowsheet Row Most Recent Value  Orbital Region No depletion  Upper Arm Region Moderate depletion  Thoracic and Lumbar Region No depletion  Buccal Region Mild depletion  Temple Region Moderate depletion  Clavicle Bone Region No depletion  Clavicle and Acromion Bone Region No depletion  Scapular Bone Region No depletion  Dorsal Hand Mild depletion  Patellar Region Moderate depletion  Anterior Thigh Region Moderate depletion  Posterior Calf Region Moderate depletion  Edema (RD Assessment) None  Hair Reviewed  Eyes Reviewed  Mouth Reviewed  Skin Reviewed  Nails Reviewed   Diet Order:   Diet Order             DIET DYS 3 Room service appropriate? No; Fluid consistency: Thin  Diet effective now                  EDUCATION NEEDS:   No education needs have been identified at this time  Skin:  Skin Assessment: Reviewed RN Assessment (UPI sacrum 4cm x 3cm x 0.5cm)  Last BM:  11/29  Height:   Ht Readings from Last 1 Encounters:  05/17/21 $RemoveB'5\' 5"'DSZueLhO$  (1.651 m)    Weight:   Wt Readings from Last 1 Encounters:  05/27/21 57.4 kg    Ideal Body Weight:  56.8 kg  BMI:  Body mass index is 21.06 kg/m.  Estimated Nutritional Needs:   Kcal:  1500-1700kcal/day  Protein:  75-85g/day  Fluid:  UOP +1L  Koleen Distance MS, RD, LDN Please refer to [facility] for RD and/or  RD on-call/weekend/after hours pager

## 2021-06-01 NOTE — Progress Notes (Signed)
Physical Therapy Treatment Patient Details Name: Brianna Dodson MRN: [id number] DOB: Jun 26, 1934 Today's Date: 06/01/2021   History of Present Illness Pt is an 87 y/o F with PMH: Alzheimer's dementia with vascular dementia and multiple strokes including R MCA March 2022 with residual L side weakness and reportedly worsened L side weakess after AV fistual sx on 02/24/21(Dr. Dew), then underwent ligation of the L UE HD site on 8/27 to improve perfusion. Pt presents this admission d/t agitation and altered mental status x3 days with family ultimately bringing pt to ED when pt became agitated at HD tx. Pt adm and tx for Acute encephalopathy (suspected metabolic) with malignant hypertension and acute delerium of unclear etiology.    PT Comments    Pt is making gradual progress towards goals and remains confused at this time. Assisted nurse tech with hygiene due to incontinent BM while in bed. Pt then able to ambulate short distance in room to dialysis chair with +2 and HHA. Anticipated pt could of ambulated further distance, however transporter waiting for transport for HD. Will continue to progress as able.  Recommendations for follow up therapy are one component of a multi-disciplinary discharge planning process, led by the attending physician.  Recommendations may be updated based on patient status, additional functional criteria and insurance authorization.  Follow Up Recommendations  Skilled nursing-short term rehab (<3 hours/day)     Assistance Recommended at Discharge Frequent or [facility] bed;Wheelchair (measurements PT);BSC/3in1    Recommendations for Other Services       Precautions / Restrictions Precautions Precautions: Fall Restrictions Weight Bearing Restrictions: No     Mobility  Bed Mobility Overal bed mobility: Needs Assistance Bed Mobility: Rolling;Supine to Sit Rolling: Mod assist   Supine to sit: Mod  assist;Max assist     General bed mobility comments: needs assist for rolling in bed. Max assist for transitioning to EOB. Heavy lateral leaning towards L side.    Transfers Overall transfer level: Needs assistance Equipment used: 2 person hand held assist Transfers: Sit to/from Stand Sit to Stand: Mod assist;+2 physical assistance           General transfer comment: Once standing, mod leaning towards R side. +2 and HHA for standing    Ambulation/Gait Ambulation/Gait assistance: Mod assist;+2 physical assistance Gait Distance (Feet): 10 Feet Assistive device: 2 person hand held assist Gait Pattern/deviations: Step-to pattern;Shuffle       General Gait Details: ambulated short distance to dialysis chair pending transfer to dialysis. Short shuffle gait performed   Stairs             Wheelchair Mobility    Modified Rankin (Stroke Patients Only)       Balance Overall balance assessment: Needs assistance Sitting-balance support: Bilateral upper extremity supported;Feet supported Sitting balance-Leahy Scale: Poor     Standing balance support: Bilateral upper extremity supported Standing balance-Leahy Scale: Zero                              Cognition Arousal/Alertness: Awake/alert Behavior During Therapy: Agitated Overall Cognitive Status: History of cognitive impairments - at baseline                                 General Comments: alert and impulsive        Exercises Other Exercises Other Exercises: rolling towards L side due to incontinence for  BM. +2 for rolling. Blood noted on bottom.    General Comments        Pertinent Vitals/Pain Pain Assessment: Faces Faces Pain Scale: Hurts a little bit Pain Location: buttocks Pain Descriptors / Indicators: Discomfort Pain Intervention(s): Limited activity within patient's tolerance    Home Living                          Prior Function            PT  Goals (current goals can now be found in the care plan section) Acute Rehab PT Goals Patient Stated Goal: to walk to bathroom PT Goal Formulation: With patient Progress towards PT goals: Progressing toward goals    Frequency    Min 2X/week      PT Plan Current plan remains appropriate    Co-evaluation              AM-PAC PT "6 Clicks" Mobility   Outcome Measure  Help needed turning from your back to your side while in a flat bed without using bedrails?: A Lot Help needed moving from lying on your back to sitting on the side of a flat bed without using bedrails?: A Lot Help needed moving to and from a bed to a chair (including a wheelchair)?: A Lot Help needed standing up from a chair using your arms (e.g., wheelchair or bedside chair)?: A Lot Help needed to walk in hospital room?: A Lot Help needed climbing 3-5 steps with a railing? : Total 6 Click Score: 11    End of Session   Activity Tolerance: Patient tolerated treatment well Patient left:  (left in dialysis chair with transporter) Nurse Communication: Mobility status;Precautions PT Visit Diagnosis: Unsteadiness on feet (R26.81);Muscle weakness (generalized) (M62.81);History of falling (Z91.81);Difficulty in walking, not elsewhere classified (R26.2);Hemiplegia and hemiparesis Hemiplegia - Right/Left: Left Hemiplegia - dominant/non-dominant: Non-dominant     Time: 7782-4235 PT Time Calculation (min) (ACUTE ONLY): 13 min  Charges:  $Gait Training: 8-22 mins                     Greggory Stallion, PT, DPT [phone]    Brianna Dodson 06/01/2021, 12:50 PM

## 2021-06-01 NOTE — Progress Notes (Signed)
bp Low 68/43, pt asymptomatic, pt is alert and awake. UF off, goal decreased to 1050ml. flushed 200ccnss, NP shantelle made aware with orderr to gve additional 200cc nss. BP increased to 93/40

## 2021-06-01 NOTE — Progress Notes (Signed)
Bp keeps on dropping to 80s. Pt is alert and asymptomatic. NP Shantelle was made aware with order to stop treatment. When blood was returned, BP increased to 103/41

## 2021-06-01 NOTE — Progress Notes (Signed)
PROGRESS NOTE  Brianna Dodson  DOB: 01-13-1934  PCP: [name], MD WNI:[id number]  DOA: 05/16/2021  LOS: 41 days  Hospital Day: 58  Chief Complaint  Patient presents with   Altered Mental Status    EMS AMS    Brief narrative: Brianna Dodson is a 87 y.o. female with PMH significant for Alzheimer's dementia, multiple strokes in the past, A. fib on chronic anticoagulation, ESRD HD MWF, DM2, HTN, HLD, OSA, carotid stenosis, chronic anemia, chronic hepatitis C,  Patient was brought to the ED from home on 11/14 for agitation, altered mental status.  In the ED, patient was significantly agitated, restless and required multiple sedating agents.   CT head showed chronic right MCA infarct, chronic atrophic and ischemic changes. Admitted to hospitalist service.  During this hospitalization, patient's mental status has waxed and waned.  She was set to go to SNF on 05/27/2021 but patient's sister refused and requested the patient come home with home health.  Hospital bed and other equipment were delivered to pt's home as per request of the sister.  During this process, patient had fever spikes and hence discharge was held.   See below for details.   Subjective: Patient was seen and examined this morning.  Elderly African-American female.  Lying down in bed.  Opens eyes on verbal command, unable to have a conversation.  Becomes restless and agitated even on gentle touch.  Assessment/Plan: Acute delirium on dementia with behavioral disturbances -Multifactorial etiology: Progressive dementia, malignant hypertension, history of recurrent stroke. -CT head did not show any acute intracranial abnormality and showed chronic right MCA infarct, chronic atrophic and ischemic changes. -Neurology and psychiatry consultations were obtained. -EEG showed cortical dysfunction arising from left frontotemporal region. -Continue remeron, risperdal, keppra, Haldol prn.  Fever spikes Staph epidermidis  bacteremia -Continue to have low-grade fever, less than 100.  -Blood cultures sent on 11/29 is growing Staphylococcus epidermidis.  True infection versus contamination. -Started on IV Ancef.  ID consulted.  ESRD HD MWF -Management per nephrology  Essential hypertension -Blood pressure was significantly elevated on arrival over 200.  Currently better with Coreg and losartan.  Continue the same.  Type 2 diabetes mellitus Hyperglycemia -A1c 5.5 -Currently on sliding scale insulin with Accu-Cheks. Recent Labs  Lab 05/31/21 1141 05/31/21 1619 05/31/21 2143 06/01/21 0743 06/01/21 1223  GLUCAP 198* 236* 216* 205* 158*   Hx of CVA Hyperlipidemia -MRI of the brain showed chronic CVAs -continue Eliquis, zetia, statin    Unstageable mid coccyx wound - POA -Continue w/ wound care   Mobility: Limited mobility because of dementia Living condition: Was living at home Goals of care:   Code Status: Full Code  Nutritional status: Body mass index is 21.06 kg/m. Nutrition Problem: Increased nutrient needs Etiology: wound healing Signs/Symptoms: estimated needs Diet:  Diet Order             Diet renal/carb modified with fluid restriction Diet-HS Snack? Nothing; Fluid restriction: 1200 mL Fluid; Room service appropriate? Yes; Fluid consistency: Thin  Diet effective now                  DVT prophylaxis:  Place and maintain sequential compression device Start: 05/20/21 1738 apixaban (ELIQUIS) tablet 2.5 mg Start: 05/17/21 1230 SCDs Start: 05/17/21 0144 apixaban (ELIQUIS) tablet 2.5 mg   Antimicrobials: IV Ancef Fluid: None Consultants: ID, nephrology Family Communication: None at bedside  Status is: Inpatient  Remains inpatient appropriate because: Ongoing work-up for fever and bacteremia   Dispo: The patient  is from: Home              Anticipated d/c is to: SNF versus home with home health              Patient currently is not medically stable to d/c.   Difficult to  place patient No     Infusions:   [START ON 06/02/2021]  ceFAZolin (ANCEF) IV      Scheduled Meds:  apixaban  2.5 mg Oral BID   ascorbic acid  500 mg Oral BID   atorvastatin  80 mg Oral Daily   carvedilol  6.25 mg Oral BID WC   Chlorhexidine Gluconate Cloth  6 each Topical Daily   Chlorhexidine Gluconate Cloth  6 each Topical Q0600   collagenase   Topical Daily   docusate sodium  200 mg Oral BID   ezetimibe  10 mg Oral Daily   feeding supplement (NEPRO CARB STEADY)  237 mL Oral BID BM   ferrous gluconate  324 mg Oral Q1500   insulin aspart  0-9 Units Subcutaneous TID WC   levETIRAcetam  250 mg Oral Once per day on Mon Wed Fri   levETIRAcetam  500 mg Oral BID   losartan  100 mg Oral QHS   mouth rinse  15 mL Mouth Rinse BID   mirtazapine  7.5 mg Oral QHS   multivitamin  1 tablet Oral QHS   polyethylene glycol  17 g Oral Daily   risperiDONE  0.5 mg Oral QHS    PRN meds: acetaminophen **OR** acetaminophen, haloperidol lactate, labetalol, ondansetron **OR** ondansetron (ZOFRAN) IV, risperiDONE, senna-docusate   Antimicrobials: Anti-infectives (From admission, onward)    Start     Dose/Rate Route Frequency Ordered Stop   06/02/21 1800  ceFAZolin (ANCEF) IVPB 1 g/50 mL premix        1 g 100 mL/hr over 30 Minutes Intravenous Daily-1800 06/01/21 1018     06/01/21 1200  ceFAZolin (ANCEF) IVPB 2g/100 mL premix        2 g 200 mL/hr over 30 Minutes Intravenous  Once 06/01/21 1018 06/01/21 1330       Objective: Vitals:   06/01/21 0444 06/01/21 0747  BP: (!) 115/54 (!) 127/55  Pulse: 73 73  Resp: 18 20  Temp: 98 F (36.7 C) 97.8 F (36.6 C)  SpO2: 96% 100%    Intake/Output Summary (Last 24 hours) at 06/01/2021 1402 Last data filed at 06/01/2021 0122 Gross per 24 hour  Intake 80 ml  Output 1 ml  Net 79 ml   Filed Weights   05/18/21 1545 05/27/21 1628  Weight: 60.4 kg 57.4 kg   Weight change:  Body mass index is 21.06 kg/m.   Physical Exam: General exam:  Elderly African-American female.  Not in physical distress at rest Skin: No rashes, lesions or ulcers. HEENT: Atraumatic, normocephalic, no obvious bleeding Lungs: Clear to auscultation bilaterally CVS: Regular rate and rhythm, no murmur GI/Abd soft, nondistended, bowel sound present CNS: Alert, awake, agitated on touch does not follow commands, demented at baseline Psychiatry: Sad affect Extremities: No pedal edema, no calf tenderness  Data Review: I have personally reviewed the laboratory data and studies available.  F/u labs ordered Unresulted Labs (From admission, onward)     Start     Ordered   06/01/21 1115  CULTURE, BLOOD (ROUTINE X 2) w Reflex to ID Panel  BLOOD CULTURE X 2,   TIMED      06/01/21 1201   05/16/21 2125  CBC  with Differential/Platelet  ONCE - STAT,   STAT        05/16/21 2124            Signed, Terrilee Croak, MD [facility] 06/01/2021

## 2021-06-01 NOTE — TOC Progression Note (Signed)
Transition of Care [facility]) - Progression Note    Patient Details  Name: Brianna Dodson MRN: [id number] Date of Birth: 1933-12-29  Transition of Care [facility] CM/SW [place], LCSW Phone Number: 06/01/2021, 3:38 PM  Clinical Narrative:  Representative from [facility] agency came by to meet with patient but she was in HD. Discussed level of care needed at this time.   Expected Discharge Plan: [place] Barriers to Discharge: Equipment Del[facility] bed was not delived on 11/26 as arranged on 05/27/21. [employer] was contacted twice on 11/26 and it was to be prioritized as patient's needed EMS transport to home.)  Expected Discharge Plan and Services Expected Discharge Plan: [place] Acute Care Choice: Resumption of Svcs/PTA Provider Living arrangements for the past 2 months: Single Family Home                                       Social Determinants of Health (SDOH) Interventions    Readmission Risk Interventions Readmission Risk Prevention Plan 05/18/2021 03/03/2021 03/16/2019  Transportation Screening Complete Complete Complete  PCP or Specialist Appt within 5-7 Days - - Complete  PCP or Specialist Appt within 3-5 Days Complete Complete -  Home Care Screening - - Complete  Medication Review (RN CM) - - Complete  HRI or Home Care Consult Complete Complete -  Social Work Consult for [place] Planning/Counseling Complete Not Complete -  SW consult not completed comments - RNCM assigned to patient -  Palliative Care Screening Not Applicable Not Applicable -  Medication Review (RN Care Manager) Complete Complete -  Some recent data might be hidden

## 2021-06-01 NOTE — Progress Notes (Signed)
   05/30/21 2044  Assess: MEWS Score  Temp (!) 101.1 F (38.4 C)  BP (!) 81/42  Pulse Rate 84  Resp (!) 28  SpO2 98 %  O2 Device Room Air  Assess: MEWS Score  MEWS Temp 1  MEWS Systolic 1  MEWS Pulse 0  MEWS RR 2  MEWS LOC 0  MEWS Score 4  MEWS Score Color Red  Assess: if the MEWS score is Yellow or Red  Were vital signs taken at a resting state? Yes  Focused Assessment Change from prior assessment (see assessment flowsheet)  Does the patient meet 2 or more of the SIRS criteria? Yes  Does the patient have a confirmed or suspected source of infection? No  Treat  MEWS Interventions Administered scheduled meds/treatments  Take Vital Signs  Increase Vital Sign Frequency  Red: Q 1hr X 4 then Q 4hr X 4, if remains red, continue Q 4hrs  Escalate  MEWS: Escalate Red: discuss with charge nurse/RN and provider, consider discussing with RRT  Notify: Charge Nurse/RN  Name of Charge Nurse/RN Notified Edwards  Date Charge Nurse/RN Notified 05/31/21  Time Charge Nurse/RN Notified 2050  Notify: Provider  Provider Name/Title Dr. Tobie Poet  Date Provider Notified 05/31/21  Time Provider Notified 2050  Notification Type Page  Notification Reason Change in status  Provider response See new orders  Notify: Rapid Response  Name of Rapid Response RN Notified Lu Duffel  Date Rapid Response Notified 05/31/21  Time Rapid Response Notified 2050  Document  Patient Outcome Stabilized after interventions  Assess: SIRS CRITERIA  SIRS Temperature  1  SIRS Pulse 0  SIRS Respirations  1  SIRS WBC 0  SIRS Score Sum  2

## 2021-06-01 NOTE — Progress Notes (Signed)
PHARMACY - PHYSICIAN COMMUNICATION CRITICAL VALUE ALERT - BLOOD CULTURE IDENTIFICATION (BCID)  Brianna Dodson is an 87 y.o. female who presented to [facility] on 05/16/2021 with a chief complaint of confusion  Assessment:  Patient developed fevers 11/28 pm and morning of 11/29, blood cx sent and are growing GPC in both sents, BCID = [id number]  Name of physician (or Provider) Contacted: Dr Pietro Cassis (and Dr Delaine Lame)  Current antibiotics: none  Changes to prescribed antibiotics recommended:  Recommendations accepted by provider - start cefazolin   Results for orders placed or performed during the hospital encounter of 05/16/21  Blood Culture ID Panel (Reflexed) (Collected: 05/31/2021 11:34 AM)  Result Value Ref Range   Enterococcus faecalis NOT DETECTED NOT DETECTED   Enterococcus Faecium NOT DETECTED NOT DETECTED   Listeria monocytogenes NOT DETECTED NOT DETECTED   Staphylococcus species DETECTED (A) NOT DETECTED   Staphylococcus aureus (BCID) NOT DETECTED NOT DETECTED   Staphylococcus epidermidis DETECTED (A) NOT DETECTED   Staphylococcus lugdunensis NOT DETECTED NOT DETECTED   Streptococcus species NOT DETECTED NOT DETECTED   Streptococcus agalactiae NOT DETECTED NOT DETECTED   Streptococcus pneumoniae NOT DETECTED NOT DETECTED   Streptococcus pyogenes NOT DETECTED NOT DETECTED   A.calcoaceticus-baumannii NOT DETECTED NOT DETECTED   Bacteroides fragilis NOT DETECTED NOT DETECTED   Enterobacterales NOT DETECTED NOT DETECTED   Enterobacter cloacae complex NOT DETECTED NOT DETECTED   Escherichia coli NOT DETECTED NOT DETECTED   Klebsiella aerogenes NOT DETECTED NOT DETECTED   Klebsiella oxytoca NOT DETECTED NOT DETECTED   Klebsiella pneumoniae NOT DETECTED NOT DETECTED   Proteus species NOT DETECTED NOT DETECTED   Salmonella species NOT DETECTED NOT DETECTED   Serratia marcescens NOT DETECTED NOT DETECTED   Haemophilus influenzae NOT DETECTED NOT DETECTED   Neisseria meningitidis NOT  DETECTED NOT DETECTED   Pseudomonas aeruginosa NOT DETECTED NOT DETECTED   Stenotrophomonas maltophilia NOT DETECTED NOT DETECTED   Candida albicans NOT DETECTED NOT DETECTED   Candida auris NOT DETECTED NOT DETECTED   Candida glabrata NOT DETECTED NOT DETECTED   Candida krusei NOT DETECTED NOT DETECTED   Candida parapsilosis NOT DETECTED NOT DETECTED   Candida tropicalis NOT DETECTED NOT DETECTED   Cryptococcus neoformans/gattii NOT DETECTED NOT DETECTED   Methicillin resistance mecA/C NOT DETECTED NOT DETECTED    Doreene Eland, PharmD, BCPS, BCIDP Work Cell: [phone] 06/01/2021 11:12 AM

## 2021-06-01 NOTE — Consult Note (Addendum)
NAME: Brianna Dodson  DOB: June 08, 1934  MRN: [id number]  Date/Time: 06/01/2021 10:00 AM  REQUESTING PROVIDER: Dr. Pietro Cassis Subjective:  REASON FOR CONSULT: staphylococcus epidermidis bacteremia ?No history from patient, chart reviewed Isys Brianna Brianna a 87 y.o. female with a history of Dodson, end-stage renal disease on dialysis paroxysmal A. fib on Eliquis, diabetes mellitus, hypertension, CVA presented to the ED on 05/16/2021 with altered mental status agitation and combativeness.  As per the note EMS had been called out a few times over the past 3 days for the same reason but family did not want transport until tonight.  She required 2.5 mg of Haldol by EMS due to agitation. Apparently her sister reported she Brianna confused at nighttime only. In the ED on presentation her temp was 97.5, pulse rate 56, sats 89% which later improved to 95%, BP initially was 155/82 but increased to 176/143, Labs revealed glucose 195, creatinine 2.54, CT head was negative for acute abnormality.  It showed chronic stroke and chronic small vessel ischemic changes.  She was diagnosed with possible PRES.   hypertensive urgency was treated with as needed IV labetalol and also with amlodipine, carvedilol and hydralazine.MRI could not be done because of her agitation.  On 05/20/2021 an EEG was done which was suggestive of cortical dysfunction arising from the left frontotemporal region.  It was thought to be secondary to underlying structural abnormality and possible postictal state.  Additionally there was moderate diffuse encephalopathy.  So Keppra was added.  On 05/21/2021 patient was more awake.  She was also seen by psychiatrist on 05/24/2021. She was pretty much stable after that.  On 05/22/2021 at 9:20 PM patient had a temperature of 102.5 and BP of 84/38.  Stat chest x-ray, EKG, blood culture was sent. Chest x-ray revealed possible small pneumothorax on the left.  Patient has a history of ischemic steal patient Brianna being  dialyzed via tunneled catheter.  She previously had a brachiocephalic fistula that failed to mature and subsequently underwent placement of a left brachial axillary arteriovenous graft on 02/24/2021.  But she presented to the emergency department complaining of pain and weakness of her arm and was noted to have markedly abnormal neurological examination of the upper extremity on 02/25/2021.  She underwent work-up including a CT of her arm carotid duplex as well as MRI of her brain.  While she did have some evidence of acute or chronic stroke affecting the right hemisphere her examination was consistent with significant ischemic steal with a nonpalpable and no notable radial artery pulse when the graft was patent.  A graft compression resulted in return of perfusion to the hand.  She was taken to the OR on 02/26/2021 for ligation of the brachial artery graft for ischemic steal syndrome.  She was discharged on 03/03/2021.  She continued to have some swelling of that arm and was being followed by vascular as outpatient. On 03/16/2021 she returned to the ED with syncopal episode and found to have hypotension.  It was thought secondary to the dialysis.  She was discharged on 03/18/2021.  She came back to the ED on 03/28/2021 with left arm pain when they try to access her dialysis graft.  It was also noted to be swollen.  She was asked to follow-up with Dr. [name] vascular surgeon as outpatient and saw him on 03/29/2021. Meanwhile patient also developed a stage II sacral decubitus and has been followed at the wound clinic.  She first went to see them on 03/31/2021 Past Medical History:  Diagnosis Date   A-fib [facility])    Alzheimer disease ([place])    Anemia    Carotid stenosis    Cerebral infarction ([place]) 04/12/2019   Multiple new foci of acute infarction affect the RIGHT hemisphere affecting the frontal, posterior frontal, posterior temporal, anterior parietal cortex and regional white matter.   Cerebral infarction due to  embolism of right middle cerebral artery ([place]) 08/20/2019   RIGHT MCA distal M1   Chronic anticoagulation    Apixaban   CKD (chronic kidney disease), stage 4(HCC)    Embolic stroke involving cerebral artery ([place]) 03/15/2019   RIGHT MCA/PCA territory   Encephalomalacia    RIGHT posterior MCA territory   HCV (hepatitis C virus)    History of blood transfusion    Hyperlipidemia    Hypertension    OSA (obstructive sleep apnea)    Status post placement of implantable loop recorder 07/30/2019   T2DM (type 2 diabetes mellitus) [facility])     Past Surgical History:  Procedure Laterality Date   ABDOMINAL HYSTERECTOMY  07/04/1983   AV FISTULA PLACEMENT Left 12/02/2020   Procedure: INSERTION OF ARTERIOVENOUS (AV)  FISTULA  ARM ( BRACHIO- CEPHALIC);  Surgeon: Algernon Huxley, MD;  Location: [facility] ORS;  Service: Vascular;  Laterality: Left;   AV FISTULA PLACEMENT Left 02/24/2021   Procedure: INSERTION OF ARTERIOVENOUS (AV) GORE-TEX GRAFT ARM;  Surgeon: Algernon Huxley, MD;  Location: [facility] ORS;  Service: Vascular;  Laterality: Left;  Left brachial Ax graft   CATARACT EXTRACTION, BILATERAL Bilateral    DIALYSIS/PERMA CATHETER INSERTION N/A 10/06/2020   Procedure: DIALYSIS/PERMA CATHETER INSERTION;  Surgeon: Algernon Huxley, MD;  Location: [place] [facility] LAB;  Service: Cardiovascular;  Laterality: N/A;   LIGATION OF ARTERIOVENOUS  FISTULA Left 02/26/2021   Procedure: LIGATION OF ARTERIOVENOUS  FISTULA;  Surgeon: Bertram Savin, MD;  Location: [facility] ORS;  Service: Vascular;  Laterality: Left;   LOOP RECORDER INSERTION N/A 07/30/2019   Procedure: LOOP RECORDER INSERTION;  Surgeon: Isaias Cowman, MD;  Location: [place] [facility] LAB;  Service: Cardiovascular;  Laterality: N/A;   TEE WITHOUT CARDIOVERSION N/A 04/14/2019   Procedure: TRANSESOPHAGEAL ECHOCARDIOGRAM (TEE);  Surgeon: Teodoro Spray, MD;  Location: [facility] ORS;  Service: Cardiovascular;  Laterality: N/A;    Social History   Socioeconomic History    Marital status: Married    Spouse name: Hubbard Robinson   Number of children: 2   Years of education: Not on file   Highest education level: Not on file  Occupational History   Occupation: [employer]    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 Retired  Tobacco Use   Smoking status: Never   Smokeless tobacco: Never  Vaping Use   Vaping Use: Never used  Substance and Sexual Activity   Alcohol use: No   Drug use: No   Sexual activity: Not Currently  Other Topics Concern   Not on file  Social History Narrative   She Brianna married and has two adult children (adopted).    Hobbies: Decorating   Exercise: None   Caffeine: 4 cups a week   Social Determinants of Radio broadcast assistant Strain: Low Risk    Difficulty of Paying Living Expenses: Not very hard  Food Insecurity: Not on file  Transportation Needs: Not on file  Physical Activity: Not on file  Stress: Not on file  Social Connections: Not on file  Intimate Partner Violence: Not on file    Family History  Problem Relation Age of Onset   Stroke  Mother    Diabetes Mother    Heart disease Father    Kidney disease Sister    Diabetes Sister    Kidney disease Brother    Heart disease Sister    Diabetes Sister    Diabetes Sister    Diabetes Sister    Kidney disease Sister    Heart disease Sister    Kidney disease Brother        kidney transplant   Early death Brother 35       Truck Accident - died   Heart disease Brother    Allergies  Allergen Reactions   Nsaids     CKD stage III - Avoid all nephrotoxic drugs   I? Current Facility-Administered Medications  Medication Dose Route Frequency Provider Last Rate Last Admin   acetaminophen (TYLENOL) tablet 650 mg  650 mg Oral Q6H PRN Athena Masse, MD   650 mg at 05/30/21 1956   Or   acetaminophen (TYLENOL) suppository 650 mg  650 mg Rectal Q6H PRN Athena Masse, MD       apixaban Arne Cleveland) tablet 2.5 mg  2.5 mg Oral BID Fritzi Mandes, MD   2.5 mg at 05/31/21 2308   ascorbic  acid (VITAMIN C) tablet 500 mg  500 mg Oral BID Wyvonnia Dusky, MD   500 mg at 05/31/21 2308   atorvastatin (LIPITOR) tablet 80 mg  80 mg Oral Daily Fritzi Mandes, MD   80 mg at 05/31/21 0824   carvedilol (COREG) tablet 6.25 mg  6.25 mg Oral BID WC Fritzi Mandes, MD   6.25 mg at 05/29/21 1654   Chlorhexidine Gluconate Cloth 2 % PADS 6 each  6 each Topical Daily Athena Masse, MD   6 each at 05/31/21 0825   Chlorhexidine Gluconate Cloth 2 % PADS 6 each  6 each Topical Q0600 Lavonia Dana, MD   6 each at 05/31/21 0657   collagenase (SANTYL) ointment   Topical Daily Wyvonnia Dusky, MD   Given at 05/31/21 [phone]   docusate sodium (COLACE) capsule 200 mg  200 mg Oral BID Wyvonnia Dusky, MD   200 mg at 05/31/21 2308   ezetimibe (ZETIA) tablet 10 mg  10 mg Oral Daily Fritzi Mandes, MD   10 mg at 05/31/21 0833   feeding supplement (NEPRO CARB STEADY) liquid 237 mL  237 mL Oral BID BM Wyvonnia Dusky, MD 0 mL/hr at 05/29/21 2227 237 mL at 05/31/21 0833   ferrous gluconate (FERGON) tablet 324 mg  324 mg Oral Q1500 Fritzi Mandes, MD   324 mg at 05/29/21 1557   haloperidol lactate (HALDOL) injection 1 mg  1 mg Intramuscular Daily PRN Fritzi Mandes, MD       insulin aspart (novoLOG) injection 0-9 Units  0-9 Units Subcutaneous TID WC Wyvonnia Dusky, MD   3 Units at 06/01/21 9604   labetalol (NORMODYNE) injection 10 mg  10 mg Intravenous Q2H PRN Fritzi Mandes, MD   10 mg at 05/18/21 5409   levETIRAcetam (KEPPRA) tablet 250 mg  250 mg Oral Once per day on Mon Wed Fri Murlean Iba, MD   250 mg at 05/30/21 1959   levETIRAcetam (KEPPRA) tablet 500 mg  500 mg Oral BID Fritzi Mandes, MD   500 mg at 05/31/21 2308   losartan (COZAAR) tablet 100 mg  100 mg Oral QHS Murlean Iba, MD   100 mg at 05/29/21 2124   MEDLINE mouth rinse  15 mL Mouth Rinse BID Fritzi Mandes,  MD   15 mL at 05/31/21 2310   mirtazapine (REMERON SOL-TAB) disintegrating tablet 7.5 mg  7.5 mg Oral QHS Fritzi Mandes, MD   7.5 mg at 05/31/21  2310   multivitamin (RENA-VIT) tablet 1 tablet  1 tablet Oral QHS Fritzi Mandes, MD   1 tablet at 05/31/21 2308   ondansetron (ZOFRAN) tablet 4 mg  4 mg Oral Q6H PRN Athena Masse, MD   4 mg at 05/25/21 0122   Or   ondansetron (ZOFRAN) injection 4 mg  4 mg Intravenous Q6H PRN Athena Masse, MD       polyethylene glycol (MIRALAX / GLYCOLAX) packet 17 g  17 g Oral Daily Wyvonnia Dusky, MD   17 g at 05/31/21 1630   risperiDONE (RISPERDAL M-TABS) disintegrating tablet 0.5 mg  0.5 mg Oral QHS Clapacs, John T, MD   0.5 mg at 05/31/21 2307   risperiDONE (RISPERDAL M-TABS) disintegrating tablet 0.5 mg  0.5 mg Oral Q6H PRN Clapacs, Madie Reno, MD   0.5 mg at 05/26/21 0850   senna-docusate (Senokot-S) tablet 1 tablet  1 tablet Oral QHS PRN Fritzi Mandes, MD         Abtx:  Anti-infectives (From admission, onward)    None       REVIEW OF SYSTEMS:  NA Pt Brianna agitated and saying she hurts everywhere  Objective:  VITALS:  BP (!) 127/55 (BP Location: Left Arm)   Pulse 73   Temp 97.8 F (36.6 C) (Oral)   Resp 20   Ht 5\' 5"  (1.651 m)   Wt 57.4 kg   SpO2 100%   BMI 21.06 kg/m  PHYSICAL EXAM:  General: Awake, agitated, shouting,seems to understand what I am saying but does not comply with any requests Head: Normocephalic, without obvious abnormality, atraumatic. Eyes: Conjunctivae clear, anicteric sclerae. Pupils are equal ENT Nares normal. No drainage or sinus tenderness. Lips, mucosa, and tongue normal. No Thrush Neck:  symmetrical, no adenopathy, thyroid: non tender no carotid bruit and no JVD. Back: did not examine as she was in dialysis Rt subclavian line Lungs: b/l air entry Heart: s1s2  Abdomen: Soft, non-tender,not distended. Bowel sounds normal. No masses Extremities: left forearm scar Skin: No rashes or lesions. Or bruising Lymph: Cervical, supraclavicular normal. Neurologic: cannot assess Pertinent Labs Lab Results CBC    Component Value Date/Time   WBC 14.7 (H)  05/31/2021 0657   RBC 3.78 (L) 05/31/2021 0657   HGB 11.2 (L) 05/31/2021 0657   HGB 12.1 11/09/2013 1540   HCT 31.6 (L) 05/31/2021 0657   HCT 36.3 11/09/2013 1540   PLT 115 (L) 05/31/2021 0657   PLT 183 11/09/2013 1540   MCV 83.6 05/31/2021 0657   MCV 87 11/09/2013 1540   MCH 29.6 05/31/2021 0657   MCHC 35.4 05/31/2021 0657   RDW 13.5 05/31/2021 0657   RDW 14.0 11/09/2013 1540   LYMPHSABS 1.0 05/16/2021 2300   MONOABS 0.4 05/16/2021 2300   EOSABS 0.2 05/16/2021 2300   BASOSABS 0.0 05/16/2021 2300    CMP Latest Ref Rng & Units 05/31/2021 05/30/2021 05/29/2021  Glucose 70 - 99 mg/dL 150(H) 186(H) 192(H)  BUN 8 - 23 mg/dL 43(H) 78(H) 60(H)  Creatinine 0.44 - 1.00 mg/dL 4.84(H) 6.71(H) 5.60(H)  Sodium 135 - 145 mmol/L 135 133(L) 133(L)  Potassium 3.5 - 5.1 mmol/L 3.8 4.7 4.1  Chloride 98 - 111 mmol/L 99 99 99  CO2 22 - 32 mmol/L 24 21(L) 23  Calcium 8.9 - 10.3 mg/dL 8.2(L) 8.7(L)  8.5(L)  Total Protein 6.5 - 8.1 g/dL - - -  Total Bilirubin 0.3 - 1.2 mg/dL - - -  Alkaline Phos 38 - 126 U/L - - -  AST 15 - 41 U/L - - -  ALT 0 - 44 U/L - - -      Microbiology: Recent Results (from the past 240 hour(s))  Resp Panel by RT-PCR (Flu A&B, Covid) Nasopharyngeal Swab     Status: None   Collection Time: 05/27/21 12:40 PM   Specimen: Nasopharyngeal Swab; Nasopharyngeal(NP) swabs in vial transport medium  Result Value Ref Range Status   SARS Coronavirus 2 by RT PCR NEGATIVE NEGATIVE Final    Comment: (NOTE) SARS-CoV-2 target nucleic acids are NOT DETECTED.  The SARS-CoV-2 RNA Brianna generally detectable in upper respiratory specimens during the acute phase of infection. The lowest concentration of SARS-CoV-2 viral copies this assay can detect Brianna 138 copies/mL. A negative result does not preclude SARS-Cov-2 infection and should not be used as the sole basis for treatment or other patient management decisions. A negative result may occur with  improper specimen collection/handling,  submission of specimen other than nasopharyngeal swab, presence of viral mutation(s) within the areas targeted by this assay, and inadequate number of viral copies(<138 copies/mL). A negative result must be combined with clinical observations, patient history, and epidemiological information. The expected result Brianna Negative.  Fact Sheet for Patients:  EntrepreneurPulse.com.au  Fact Sheet for Healthcare Providers:  IncredibleEmployment.be  This test Brianna no t yet approved or cleared by the [place] FDA and  has been authorized for detection and/or diagnosis of SARS-CoV-2 by FDA under an Emergency Use Authorization (EUA). This EUA will remain  in effect (meaning this test can be used) for the duration of the COVID-19 declaration under Section 564(b)(1) of the Act, 21 U.S.C.section 360bbb-3(b)(1), unless the authorization Brianna terminated  or revoked sooner.       Influenza A by PCR NEGATIVE NEGATIVE Final   Influenza B by PCR NEGATIVE NEGATIVE Final    Comment: (NOTE) The Xpert Xpress SARS-CoV-2/FLU/RSV plus assay Brianna intended as an aid in the diagnosis of influenza from Nasopharyngeal swab specimens and should not be used as a sole basis for treatment. Nasal washings and aspirates are unacceptable for Xpert Xpress SARS-CoV-2/FLU/RSV testing.  Fact Sheet for Patients: EntrepreneurPulse.com.au  Fact Sheet for Healthcare Providers: IncredibleEmployment.be  This test Brianna not yet approved or cleared by the [place] FDA and has been authorized for detection and/or diagnosis of SARS-CoV-2 by FDA under an Emergency Use Authorization (EUA). This EUA will remain in effect (meaning this test can be used) for the duration of the COVID-19 declaration under Section 564(b)(1) of the Act, 21 U.S.C. section 360bbb-3(b)(1), unless the authorization Brianna terminated or revoked.  Performed at [facility], [address] [place], [place] [address]   CULTURE, BLOOD (ROUTINE X 2) w Reflex to ID Panel     Status: None (Preliminary result)   Collection Time: 05/31/21 10:21 AM   Specimen: BLOOD  Result Value Ref Range Status   Specimen Description BLOOD BLOOD LEFT HAND  Final   Special Requests   Final    BOTTLES DRAWN AEROBIC AND ANAEROBIC Blood Culture adequate volume   Culture   Final    AEROBIC BOTTLE ONLY GRAM POSITIVE COCCI CRITICAL RESULT CALLED TO, READ BACK BY AND VERIFIED WITH: [facility] MITCHELL 06/01/21 [place] Performed at [facility], [address], [place], [place] [address]    Report Status PENDING  Incomplete  CULTURE, BLOOD (ROUTINE X 2) w Reflex to ID Panel     Status: None (Preliminary result)   Collection Time: 05/31/21 11:34 AM   Specimen: BLOOD  Result Value Ref Range Status   Specimen Description BLOOD BLOOD LEFT HAND  Final   Special Requests   Final    BOTTLES DRAWN AEROBIC AND ANAEROBIC Blood Culture adequate volume   Culture  Setup Time   Final    GRAM POSITIVE COCCI AEROBIC BOTTLE ONLY Organism ID to follow CRITICAL RESULT CALLED TO, READ BACK BY AND VERIFIED WITH: DEVON MITCHELL 06/01/21 [id number] KLW Performed at [facility], [address] [place], [place] [address]    Culture GRAM POSITIVE COCCI  Final   Report Status PENDING  Incomplete  Blood Culture ID Panel (Reflexed)     Status: Abnormal   Collection Time: 05/31/21 11:34 AM  Result Value Ref Range Status   Enterococcus faecalis NOT DETECTED NOT DETECTED Final   Enterococcus Faecium NOT DETECTED NOT DETECTED Final   Listeria monocytogenes NOT DETECTED NOT DETECTED Final   Staphylococcus species DETECTED (A) NOT DETECTED Final    Comment: CRITICAL RESULT CALLED TO, READ BACK BY AND VERIFIED WITH: DEVAN MITCHELL 06/01/21 0928 KLW    Staphylococcus aureus (BCID) NOT DETECTED NOT DETECTED Final   Staphylococcus epidermidis DETECTED (A) NOT DETECTED Final    Comment: CRITICAL RESULT  CALLED TO, READ BACK BY AND VERIFIED WITH: DEVAN MITCHELL 06/01/21 0928 KLW    Staphylococcus lugdunensis NOT DETECTED NOT DETECTED Final   Streptococcus species NOT DETECTED NOT DETECTED Final   Streptococcus agalactiae NOT DETECTED NOT DETECTED Final   Streptococcus pneumoniae NOT DETECTED NOT DETECTED Final   Streptococcus pyogenes NOT DETECTED NOT DETECTED Final   A.calcoaceticus-baumannii NOT DETECTED NOT DETECTED Final   Bacteroides fragilis NOT DETECTED NOT DETECTED Final   Enterobacterales NOT DETECTED NOT DETECTED Final   Enterobacter cloacae complex NOT DETECTED NOT DETECTED Final   Escherichia coli NOT DETECTED NOT DETECTED Final   Klebsiella aerogenes NOT DETECTED NOT DETECTED Final   Klebsiella oxytoca NOT DETECTED NOT DETECTED Final   Klebsiella pneumoniae NOT DETECTED NOT DETECTED Final   Proteus species NOT DETECTED NOT DETECTED Final   Salmonella species NOT DETECTED NOT DETECTED Final   Serratia marcescens NOT DETECTED NOT DETECTED Final   Haemophilus influenzae NOT DETECTED NOT DETECTED Final   Neisseria meningitidis NOT DETECTED NOT DETECTED Final   Pseudomonas aeruginosa NOT DETECTED NOT DETECTED Final   Stenotrophomonas maltophilia NOT DETECTED NOT DETECTED Final   Candida albicans NOT DETECTED NOT DETECTED Final   Candida auris NOT DETECTED NOT DETECTED Final   Candida glabrata NOT DETECTED NOT DETECTED Final   Candida krusei NOT DETECTED NOT DETECTED Final   Candida parapsilosis NOT DETECTED NOT DETECTED Final   Candida tropicalis NOT DETECTED NOT DETECTED Final   Cryptococcus neoformans/gattii NOT DETECTED NOT DETECTED Final   Methicillin resistance mecA/C NOT DETECTED NOT DETECTED Final    Comment: Performed at [facility], [address] [place], [place] [address]    IMAGING RESULTS:  I have personally reviewed the films ? Impression/Recommendation ? MSSE bacteremia- source could be sacral wound or dialysis cath Also has a loop  recorder Repeat culture sent- pt has been started on cefazolin today- may need to remove the HD cath/loop recorder Has AV graft left forearm- non functioning- scar - no erythema or discharge Send culture from the sacral wound  ESRD on dialysis thru HD cath  H/O Non functioning AV  graft- brachial artery ligated due to steal syndrome in Aug 2022  Hypertensive emergency on admission- PRES was questioned treated-  Encephalopathy on admission Post ictal on keppra  Dodson - with agitation  CVA - chronic multiple infarcts   Paroxysmal Afib- on eliquis  Discussed with care team Note:  This document was prepared using Dragon voice recognition software and may include unintentional dictation errors.

## 2021-06-02 DIAGNOSIS — R451 Restlessness and agitation: Secondary | ICD-10-CM | POA: Diagnosis not present

## 2021-06-02 DIAGNOSIS — G9340 Encephalopathy, unspecified: Secondary | ICD-10-CM

## 2021-06-02 LAB — CBC WITH DIFFERENTIAL/PLATELET
Abs Immature Granulocytes: 0.16 10*3/uL — ABNORMAL HIGH (ref 0.00–0.07)
Basophils Absolute: 0.2 10*3/uL — ABNORMAL HIGH (ref 0.0–0.1)
Basophils Relative: 1 %
Eosinophils Absolute: 0 10*3/uL (ref 0.0–0.5)
Eosinophils Relative: 0 %
HCT: 28.7 % — ABNORMAL LOW (ref 36.0–46.0)
Hemoglobin: 10.1 g/dL — ABNORMAL LOW (ref 12.0–15.0)
Immature Granulocytes: 1 %
Lymphocytes Relative: 4 %
Lymphs Abs: 0.7 10*3/uL (ref 0.7–4.0)
MCH: 29.6 pg (ref 26.0–34.0)
MCHC: 35.2 g/dL (ref 30.0–36.0)
MCV: 84.2 fL (ref 80.0–100.0)
Monocytes Absolute: 0.6 10*3/uL (ref 0.1–1.0)
Monocytes Relative: 4 %
Neutro Abs: 16.3 10*3/uL — ABNORMAL HIGH (ref 1.7–7.7)
Neutrophils Relative %: 90 %
Platelets: 100 10*3/uL — ABNORMAL LOW (ref 150–400)
RBC: 3.41 MIL/uL — ABNORMAL LOW (ref 3.87–5.11)
RDW: 13.6 % (ref 11.5–15.5)
Smear Review: NORMAL
WBC: 18 10*3/uL — ABNORMAL HIGH (ref 4.0–10.5)
nRBC: 0 % (ref 0.0–0.2)

## 2021-06-02 LAB — BASIC METABOLIC PANEL
Anion gap: 13 (ref 5–15)
BUN: 52 mg/dL — ABNORMAL HIGH (ref 8–23)
CO2: 23 mmol/L (ref 22–32)
Calcium: 8 mg/dL — ABNORMAL LOW (ref 8.9–10.3)
Chloride: 95 mmol/L — ABNORMAL LOW (ref 98–111)
Creatinine, Ser: 5.05 mg/dL — ABNORMAL HIGH (ref 0.44–1.00)
GFR, Estimated: 8 mL/min — ABNORMAL LOW (ref >60)
Glucose, Bld: 217 mg/dL — ABNORMAL HIGH (ref 70–99)
Potassium: 3.9 mmol/L (ref 3.5–5.1)
Sodium: 131 mmol/L — ABNORMAL LOW (ref 135–145)

## 2021-06-02 LAB — GLUCOSE, CAPILLARY
Glucose-Capillary: 121 mg/dL — ABNORMAL HIGH (ref 70–99)
Glucose-Capillary: 135 mg/dL — ABNORMAL HIGH (ref 70–99)
Glucose-Capillary: 157 mg/dL — ABNORMAL HIGH (ref 70–99)
Glucose-Capillary: 215 mg/dL — ABNORMAL HIGH (ref 70–99)
Glucose-Capillary: 221 mg/dL — ABNORMAL HIGH (ref 70–99)

## 2021-06-02 MED ORDER — METRONIDAZOLE 500 MG/100ML IV SOLN
500.0000 mg | Freq: Two times a day (BID) | INTRAVENOUS | Status: DC
  Administered 2021-06-02 – 2021-06-03 (×2): 500 mg via INTRAVENOUS
  Filled 2021-06-02 (×3): qty 100

## 2021-06-02 MED ORDER — POLYETHYLENE GLYCOL 3350 17 G PO PACK: 17.0000 g | PACK | Freq: Every day | ORAL | Status: DC | PRN

## 2021-06-02 NOTE — TOC Progression Note (Addendum)
Transition of Care [facility]) - Progression Note    Patient Details  Name: Brianna Dodson MRN: [id number] Date of Birth: Jun 20, 1934  Transition of Care [facility]) CM/SW [place], LCSW Phone Number: 06/02/2021, 10:45 AM  Clinical Narrative:  HD center switched to [facility] MWF 11:40. Called sister to get name of transport service and she stated that patient would not be going to HD if she comes home because she cannot get her out of the house and to the [place]. CSW explained that if she is not getting HD, she will need to transition to hospice. Sister asked if hospice did HD. CSW told her no, that this would be end of life services and that is why SNF is recommended. Sister again said she is not going to [place]. Told her she now has a bed offer from [facility] so she is going to look into it. Put comment on referral about new HD center and sent referral back out to [facility].   1:12 pm: Received voicemail from sister stating that she felt [facility] was decent. They currently have 3 COVID positive residents so when they can accept her will just depend on bed availability. Told admissions coordinator she has a fever so will just let her know once medically stable. Called husband and provided update. He is agreeable.  Expected Discharge Plan: [place] Barriers to Discharge: Equipment Delay [facility] bed was not delived on 11/26 as arranged on 05/27/21. [employer] was contacted twice on 11/26 and it was to be prioritized as patient's needed EMS transport to home.)  Expected Discharge Plan and Services Expected Discharge Plan: [place] Acute Care Choice: Resumption of Svcs/PTA Provider Living arrangements for the past 2 months: Single Family Home                                       Social Determinants of Health (SDOH) Interventions    Readmission Risk Interventions Readmission Risk Prevention Plan 05/18/2021  03/03/2021 03/16/2019  Transportation Screening Complete Complete Complete  PCP or Specialist Appt within 5-7 Days - - Complete  PCP or Specialist Appt within 3-5 Days Complete Complete -  Home Care Screening - - Complete  Medication Review (RN CM) - - Complete  HRI or Home Care Consult Complete Complete -  Social Work Consult for [place] Planning/Counseling Complete Not Complete -  SW consult not completed comments - RNCM assigned to patient -  Palliative Care Screening Not Applicable Not Applicable -  Medication Review (RN Care Manager) Complete Complete -  Some recent data might be hidden

## 2021-06-02 NOTE — Plan of Care (Signed)
  Problem: Education: Goal: Knowledge of General Education information will improve Description: Including pain rating scale, medication(s)/side effects and non-pharmacologic comfort measures Outcome: Progressing   Problem: Health Behavior/Discharge Planning: Goal: Ability to manage health-related needs will improve Outcome: Progressing   Problem: Clinical Measurements: Goal: Ability to maintain clinical measurements within normal limits will improve Outcome: Progressing Goal: Will remain free from infection Outcome: Progressing Goal: Diagnostic test results will improve Outcome: Progressing   Problem: Nutrition: Goal: Adequate nutrition will be maintained Outcome: Progressing   Problem: Pain Managment: Goal: General experience of comfort will improve Outcome: Progressing   Problem: Safety: Goal: Ability to remain free from injury will improve Outcome: Progressing   Problem: Skin Integrity: Goal: Risk for impaired skin integrity will decrease Outcome: Progressing

## 2021-06-02 NOTE — Progress Notes (Signed)
ID Patient is somnolent On calling her name persistently she says yes Does not follow any other commands  Patient Vitals for the past 24 hrs:  BP Temp Temp src Pulse Resp SpO2  06/02/21 2005 (!) 95/41 98 F (36.7 C) -- 75 18 100 %  06/02/21 1524 (!) 104/54 97.7 F (36.5 C) -- 75 -- 98 %  06/02/21 1140 120/65 97.8 F (36.6 C) -- 76 19 96 %  06/02/21 0751 (!) 107/55 97.6 F (36.4 C) -- 77 -- 95 %  06/02/21 0512 (!) 92/40 98.6 F (37 C) Oral 67 16 92 %  06/02/21 0304 (!) 98/42 (!) 100.7 F (38.2 C) Oral 89 16 97 %  06/02/21 0245 -- (!) 101.6 F (38.7 C) Oral -- -- --  06/01/21 2331 (!) 108/40 -- -- -- -- --  06/01/21 2259 (!) 82/35 99 F (37.2 C) Oral 88 18 --    Right subclavian hemodialysis catheter Chest bilateral air entry Heart sound irregular Abdomen soft Sacrum: Decubitus unstageable foul-smelling Hypopigmentation perianal area     Labs CBC Latest Ref Rng & Units 06/02/2021 06/01/2021 05/31/2021  WBC 4.0 - 10.5 K/uL 18.0(H) 15.1(H) 14.7(H)  Hemoglobin 12.0 - 15.0 g/dL 10.1(L) 11.0(L) 11.2(L)  Hematocrit 36.0 - 46.0 % 28.7(L) 31.5(L) 31.6(L)  Platelets 150 - 400 K/uL 100(L) 117(L) 115(L)    CMP Latest Ref Rng & Units 06/02/2021 06/01/2021 05/31/2021  Glucose 70 - 99 mg/dL 217(H) 148(H) 150(H)  BUN 8 - 23 mg/dL 52(H) 67(H) 43(H)  Creatinine 0.44 - 1.00 mg/dL 5.05(H) 6.28(H) 4.84(H)  Sodium 135 - 145 mmol/L 131(L) 132(L) 135  Potassium 3.5 - 5.1 mmol/L 3.9 4.0 3.8  Chloride 98 - 111 mmol/L 95(L) 97(L) 99  CO2 22 - 32 mmol/L 23 23 24   Calcium 8.9 - 10.3 mg/dL 8.0(L) 8.3(L) 8.2(L)  Total Protein 6.5 - 8.1 g/dL - - -  Total Bilirubin 0.3 - 1.2 mg/dL - - -  Alkaline Phos 38 - 126 U/L - - -  AST 15 - 41 U/L - - -  ALT 0 - 44 U/L - - -    Micro 05/31/2021 Blood culture staff epidermidis 06/01/2021 blood cultures have been sent 06/01/2021 sacral wound culture has been sent  Impression/recommendation  MSSA bacteremia.  Source could be a sacral wound which is  necrotic and infected of the dialysis catheter She also has a loop recorder Repeat culture has been sent Patient is currently on cefazolin.   will add Flagyl.  As patient has fever needs surgical consult and debridement of the wound because it is necrotic and infected Patient also need removal of the permacath Will get 2D echo.     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  As she presented with encephalopathy and press was questioned and it was treated. EEG showed postictal changes and she has been on Keppra   Dementia with agitation on multiple medication  CTA with chronic multiple infarcts  Paroxysmal A. fib on Eliquis  Recommend palliative consult to address treatment goals   Discussed the management with the care team.

## 2021-06-02 NOTE — Progress Notes (Signed)
[facility]  ROUNDING NOTE   Subjective:   Patient very confused today, unable to tell me where she is  Per nursing, appetite remains poor  Dialysis terminated early due to hypotension   Objective:  Vital signs in last 24 hours:  Temp:  [97.6 F (36.4 C)-101.6 F (38.7 C)] 97.8 F (36.6 C) (12/01 1140) Pulse Rate:  [62-91] 76 (12/01 1140) Resp:  [16-20] 16 (12/01 0512) BP: (68-120)/(35-65) 120/65 (12/01 1140) SpO2:  [92 %-100 %] 96 % (12/01 1140)  Weight change:  Filed Weights   05/18/21 1545 05/27/21 1628  Weight: 60.4 kg 57.4 kg    Intake/Output: I/O last 3 completed shifts: In: 68 [P.O.:80] Out: -462 [Urine:1]   Intake/Output this shift:  Total I/O In: 30 [P.O.:30] Out: -   Physical Exam: General: no acute distress, confused  Head: Normocephalic, atraumatic. Moist oral mucosal membranes  Lungs:  Clear to auscultation, normal effort  Heart: No rub or gallop  Abdomen:  Soft, nontender, nondistended  Extremities:  No peripheral edema.  Neurologic: Alert to self,  Left sided weakness.   Skin: No lesions  Access: Right IJ permcath    Basic Metabolic Panel: Recent Labs  Lab 05/29/21 0623 05/30/21 0654 05/31/21 0657 06/01/21 1215 06/02/21 0859  NA 133* 133* 135 132* 131*  K 4.1 4.7 3.8 4.0 3.9  CL 99 99 99 97* 95*  CO2 23 21* 24 23 23   GLUCOSE 192* 186* 150* 148* 217*  BUN 60* 78* 43* 67* 52*  CREATININE 5.60* 6.71* 4.84* 6.28* 5.05*  CALCIUM 8.5* 8.7* 8.2* 8.3* 8.0*     Liver Function Tests: No results for input(s): AST, ALT, ALKPHOS, BILITOT, PROT, ALBUMIN in the last 168 hours.  No results for input(s): LIPASE, AMYLASE in the last 168 hours. No results for input(s): AMMONIA in the last 168 hours.   CBC: Recent Labs  Lab 05/29/21 0623 05/30/21 0654 05/31/21 0657 06/01/21 1215 06/02/21 0859  WBC 9.8 12.6* 14.7* 15.1* 18.0*  NEUTROABS  --   --   --   --  16.3*  HGB 11.8* 12.3 11.2* 11.0* 10.1*  HCT 34.3* 35.7* 31.6*  31.5* 28.7*  MCV 84.3 83.6 83.6 84.0 84.2  PLT 123* 121* 115* 117* 100*     Cardiac Enzymes: No results for input(s): CKTOTAL, CKMB, CKMBINDEX, TROPONINI in the last 168 hours.  BNP: Invalid input(s): POCBNP  CBG: Recent Labs  Lab 06/01/21 1223 06/01/21 1816 06/01/21 2039 06/02/21 0750 06/02/21 1137  GLUCAP 158* 115* 170* 215* 221*     Microbiology: Results for orders placed or performed during the hospital encounter of 05/16/21  Resp Panel by RT-PCR (Flu A&B, Covid) Nasopharyngeal Swab     Status: None   Collection Time: 05/16/21 10:07 PM   Specimen: Nasopharyngeal Swab; Nasopharyngeal(NP) swabs in vial transport medium  Result Value Ref Range Status   SARS Coronavirus 2 by RT PCR NEGATIVE NEGATIVE Final    Comment: (NOTE) SARS-CoV-2 target nucleic acids are NOT DETECTED.  The SARS-CoV-2 RNA is generally detectable in upper respiratory specimens during the acute phase of infection. The lowest concentration of SARS-CoV-2 viral copies this assay can detect is 138 copies/mL. A negative result does not preclude SARS-Cov-2 infection and should not be used as the sole basis for treatment or other patient management decisions. A negative result may occur with  improper specimen collection/handling, submission of specimen other than nasopharyngeal swab, presence of viral mutation(s) within the areas targeted by this assay, and inadequate number of viral copies(<138  copies/mL). A negative result must be combined with clinical observations, patient history, and epidemiological information. The expected result is Negative.  Fact Sheet for Patients:  EntrepreneurPulse.com.au  Fact Sheet for Healthcare Providers:  IncredibleEmployment.be  This test is no t yet approved or cleared by the [place] FDA and  has been authorized for detection and/or diagnosis of SARS-CoV-2 by FDA under an Emergency Use Authorization (EUA). This EUA will  remain  in effect (meaning this test can be used) for the duration of the COVID-19 declaration under Section 564(b)(1) of the Act, 21 U.S.C.section 360bbb-3(b)(1), unless the authorization is terminated  or revoked sooner.       Influenza A by PCR NEGATIVE NEGATIVE Final   Influenza B by PCR NEGATIVE NEGATIVE Final    Comment: (NOTE) The Xpert Xpress SARS-CoV-2/FLU/RSV plus assay is intended as an aid in the diagnosis of influenza from Nasopharyngeal swab specimens and should not be used as a sole basis for treatment. Nasal washings and aspirates are unacceptable for Xpert Xpress SARS-CoV-2/FLU/RSV testing.  Fact Sheet for Patients: EntrepreneurPulse.com.au  Fact Sheet for Healthcare Providers: IncredibleEmployment.be  This test is not yet approved or cleared by the [place] FDA and has been authorized for detection and/or diagnosis of SARS-CoV-2 by FDA under an Emergency Use Authorization (EUA). This EUA will remain in effect (meaning this test can be used) for the duration of the COVID-19 declaration under Section 564(b)(1) of the Act, 21 U.S.C. section 360bbb-3(b)(1), unless the authorization is terminated or revoked.  Performed at [facility], [address] [place], [place] [address]   Urine Culture     Status: Abnormal   Collection Time: 05/16/21 10:07 PM   Specimen: In/Out Cath Urine  Result Value Ref Range Status   Specimen Description   Final    IN/OUT CATH URINE Performed at [facility], [address], [place], [place] [address]    Special Requests   Final    NONE Performed at [facility], [address] [place], [place] [address]    Culture (A)  Final    >=100,000 COLONIES/mL GARDNERELLA VAGINALIS Standardized susceptibility testing for this organism is not available. 80,000 COLONIES/mL DIPHTHEROIDS(CORYNEBACTERIUM SPECIES)    Report Status 05/19/2021 FINAL  Final  Resp Panel by  RT-PCR (Flu A&B, Covid) Nasopharyngeal Swab     Status: None   Collection Time: 05/27/21 12:40 PM   Specimen: Nasopharyngeal Swab; Nasopharyngeal(NP) swabs in vial transport medium  Result Value Ref Range Status   SARS Coronavirus 2 by RT PCR NEGATIVE NEGATIVE Final    Comment: (NOTE) SARS-CoV-2 target nucleic acids are NOT DETECTED.  The SARS-CoV-2 RNA is generally detectable in upper respiratory specimens during the acute phase of infection. The lowest concentration of SARS-CoV-2 viral copies this assay can detect is 138 copies/mL. A negative result does not preclude SARS-Cov-2 infection and should not be used as the sole basis for treatment or other patient management decisions. A negative result may occur with  improper specimen collection/handling, submission of specimen other than nasopharyngeal swab, presence of viral mutation(s) within the areas targeted by this assay, and inadequate number of viral copies(<138 copies/mL). A negative result must be combined with clinical observations, patient history, and epidemiological information. The expected result is Negative.  Fact Sheet for Patients:  EntrepreneurPulse.com.au  Fact Sheet for Healthcare Providers:  IncredibleEmployment.be  This test is no t yet approved or cleared by the [place] FDA and  has been authorized for detection and/or diagnosis of SARS-CoV-2 by  FDA under an Emergency Use Authorization (EUA). This EUA will remain  in effect (meaning this test can be used) for the duration of the COVID-19 declaration under Section 564(b)(1) of the Act, 21 U.S.C.section 360bbb-3(b)(1), unless the authorization is terminated  or revoked sooner.       Influenza A by PCR NEGATIVE NEGATIVE Final   Influenza B by PCR NEGATIVE NEGATIVE Final    Comment: (NOTE) The Xpert Xpress SARS-CoV-2/FLU/RSV plus assay is intended as an aid in the diagnosis of influenza from Nasopharyngeal swab  specimens and should not be used as a sole basis for treatment. Nasal washings and aspirates are unacceptable for Xpert Xpress SARS-CoV-2/FLU/RSV testing.  Fact Sheet for Patients: EntrepreneurPulse.com.au  Fact Sheet for Healthcare Providers: IncredibleEmployment.be  This test is not yet approved or cleared by the [place] FDA and has been authorized for detection and/or diagnosis of SARS-CoV-2 by FDA under an Emergency Use Authorization (EUA). This EUA will remain in effect (meaning this test can be used) for the duration of the COVID-19 declaration under Section 564(b)(1) of the Act, 21 U.S.C. section 360bbb-3(b)(1), unless the authorization is terminated or revoked.  Performed at [facility], [address] [place], [place] [address]   CULTURE, BLOOD (ROUTINE X 2) w Reflex to ID Panel     Status: None (Preliminary result)   Collection Time: 05/31/21 10:21 AM   Specimen: BLOOD  Result Value Ref Range Status   Specimen Description   Final    BLOOD BLOOD LEFT HAND Performed at [facility], [address] [place], [place] [address]    Special Requests   Final    BOTTLES DRAWN AEROBIC AND ANAEROBIC Blood Culture adequate volume Performed at [facility], [address], [place], [place] [address]    Culture  Setup Time   Final    AEROBIC BOTTLE ONLY GRAM POSITIVE COCCI CRITICAL RESULT CALLED TO, READ BACK BY AND VERIFIED WITH: [facility] MITCHELL 06/01/21 5638 KLW Performed at [facility], [address] [place], [place] [address]    Culture   Final    GRAM POSITIVE COCCI CULTURE REINCUBATED FOR BETTER GROWTH Performed at [facility], [place] [address] [place], [place] [address]    Report Status PENDING  Incomplete  CULTURE, BLOOD (ROUTINE X 2) w Reflex to ID Panel     Status: Abnormal (Preliminary result)   Collection Time: 05/31/21 11:34 AM   Specimen: BLOOD  Result Value Ref Range  Status   Specimen Description   Final    BLOOD BLOOD LEFT HAND Performed at [facility], [address] [place], [place] [address]    Special Requests   Final    BOTTLES DRAWN AEROBIC AND ANAEROBIC Blood Culture adequate volume Performed at [facility], [address] [place], [place] [address]    Culture  Setup Time   Final    GRAM POSITIVE COCCI AEROBIC BOTTLE ONLY CRITICAL RESULT CALLED TO, READ BACK BY AND VERIFIED WITH: DEVON MITCHELL 06/01/21 0928 KLW    Culture (A)  Final    STAPHYLOCOCCUS EPIDERMIDIS CULTURE REINCUBATED FOR BETTER GROWTH Performed at [facility], [address] [address] [place], [place] [address]    Report Status PENDING  Incomplete  Blood Culture ID Panel (Reflexed)     Status: Abnormal   Collection Time: 05/31/21 11:34 AM  Result Value Ref Range Status   Enterococcus faecalis NOT DETECTED NOT DETECTED Final   Enterococcus Faecium NOT DETECTED NOT DETECTED Final  Listeria monocytogenes NOT DETECTED NOT DETECTED Final   Staphylococcus species DETECTED (A) NOT DETECTED Final    Comment: CRITICAL RESULT CALLED TO, READ BACK BY AND VERIFIED WITH: DEVAN MITCHELL 06/01/21 0928 KLW    Staphylococcus aureus (BCID) NOT DETECTED NOT DETECTED Final   Staphylococcus epidermidis DETECTED (A) NOT DETECTED Final    Comment: CRITICAL RESULT CALLED TO, READ BACK BY AND VERIFIED WITH: DEVAN MITCHELL 06/01/21 0928 KLW    Staphylococcus lugdunensis NOT DETECTED NOT DETECTED Final   Streptococcus species NOT DETECTED NOT DETECTED Final   Streptococcus agalactiae NOT DETECTED NOT DETECTED Final   Streptococcus pneumoniae NOT DETECTED NOT DETECTED Final   Streptococcus pyogenes NOT DETECTED NOT DETECTED Final   A.calcoaceticus-baumannii NOT DETECTED NOT DETECTED Final   Bacteroides fragilis NOT DETECTED NOT DETECTED Final   Enterobacterales NOT DETECTED NOT DETECTED Final   Enterobacter cloacae complex NOT DETECTED NOT DETECTED Final    Escherichia coli NOT DETECTED NOT DETECTED Final   Klebsiella aerogenes NOT DETECTED NOT DETECTED Final   Klebsiella oxytoca NOT DETECTED NOT DETECTED Final   Klebsiella pneumoniae NOT DETECTED NOT DETECTED Final   Proteus species NOT DETECTED NOT DETECTED Final   Salmonella species NOT DETECTED NOT DETECTED Final   Serratia marcescens NOT DETECTED NOT DETECTED Final   Haemophilus influenzae NOT DETECTED NOT DETECTED Final   Neisseria meningitidis NOT DETECTED NOT DETECTED Final   Pseudomonas aeruginosa NOT DETECTED NOT DETECTED Final   Stenotrophomonas maltophilia NOT DETECTED NOT DETECTED Final   Candida albicans NOT DETECTED NOT DETECTED Final   Candida auris NOT DETECTED NOT DETECTED Final   Candida glabrata NOT DETECTED NOT DETECTED Final   Candida krusei NOT DETECTED NOT DETECTED Final   Candida parapsilosis NOT DETECTED NOT DETECTED Final   Candida tropicalis NOT DETECTED NOT DETECTED Final   Cryptococcus neoformans/gattii NOT DETECTED NOT DETECTED Final   Methicillin resistance mecA/C NOT DETECTED NOT DETECTED Final    Comment: Performed at [facility], [address] [place], [place] [address]  CULTURE, BLOOD (ROUTINE X 2) w Reflex to ID Panel     Status: None (Preliminary result)   Collection Time: 06/01/21 12:15 PM   Specimen: BLOOD  Result Value Ref Range Status   Specimen Description BLOOD BLOOD LEFT HAND  Final   Special Requests   Final    BOTTLES DRAWN AEROBIC AND ANAEROBIC Blood Culture adequate volume   Culture   Final    NO GROWTH < 24 HOURS Performed at [facility], [address] [place], [place] [address]    Report Status PENDING  Incomplete  CULTURE, BLOOD (ROUTINE X 2) w Reflex to ID Panel     Status: None (Preliminary result)   Collection Time: 06/01/21  1:16 PM   Specimen: BLOOD  Result Value Ref Range Status   Specimen Description BLOOD BLOOD LEFT HAND  Final   Special Requests   Final    BOTTLES DRAWN AEROBIC AND ANAEROBIC  Blood Culture results may not be optimal due to an inadequate volume of blood received in culture bottles   Culture   Final    NO GROWTH < 24 HOURS Performed at [facility], [address] [place], [place] [address]    Report Status PENDING  Incomplete  Aerobic Culture w Gram Stain (superficial specimen)     Status: None (Preliminary result)   Collection Time: 06/01/21  6:08 PM   Specimen: Wound  Result Value Ref Range Status   Specimen Description   Final  WOUND Performed at [facility], [address] [place], [place] [address]    Special Requests   Final    NONE Performed at [facility], [place], [place] [address]    Gram Stain   Final    NO SQUAMOUS EPITHELIAL CELLS SEEN FEW WBC SEEN MODERATE GRAM POSITIVE RODS MODERATE GRAM POSITIVE COCCI Performed at [facility], [place] [address], [place], [place] [address]    Culture PENDING  Incomplete   Report Status PENDING  Incomplete    Coagulation Studies: No results for input(s): LABPROT, INR in the last 72 hours.  Urinalysis: No results for input(s): COLORURINE, LABSPEC, PHURINE, GLUCOSEU, HGBUR, BILIRUBINUR, KETONESUR, PROTEINUR, UROBILINOGEN, NITRITE, LEUKOCYTESUR in the last 72 hours.  Invalid input(s): APPERANCEUR     Imaging: No results found.   Medications:     ceFAZolin (ANCEF) IV       apixaban  2.5 mg Oral BID   ascorbic acid  500 mg Oral BID   atorvastatin  80 mg Oral Daily   carvedilol  6.25 mg Oral BID WC   Chlorhexidine Gluconate Cloth  6 each Topical Daily   Chlorhexidine Gluconate Cloth  6 each Topical Q0600   collagenase   Topical Daily   docusate sodium  200 mg Oral BID   ezetimibe  10 mg Oral Daily   feeding supplement  237 mL Oral TID BM   ferrous gluconate  324 mg Oral Q1500   insulin aspart  0-9 Units Subcutaneous TID WC   levETIRAcetam  250 mg Oral Once per day on Mon Wed Fri   levETIRAcetam  500 mg Oral BID   losartan  100 mg  Oral QHS   mouth rinse  15 mL Mouth Rinse BID   mirtazapine  7.5 mg Oral QHS   multivitamin  1 tablet Oral QHS   polyethylene glycol  17 g Oral Daily   risperiDONE  0.5 mg Oral QHS   acetaminophen **OR** acetaminophen, haloperidol lactate, labetalol, ondansetron **OR** ondansetron (ZOFRAN) IV, risperiDONE, senna-docusate  Assessment/ Plan:  Brianna Dodson is a 87 y.o. black female with end stage renal disease on hemodialysis, hypertension, CVA, vascular dementia, hyperlipidemia, diabetes mellitus type II, atrial fibrillation who is admitted to [facility] on 05/16/2021 for Confusion [R41.0] Agitation [R45.1]   [facility] MWF [facility] [address] RIJ permcath 65.5kg   End Stage Renal Disease requiring hemodialysis:  - dialysis received yesterday. Terminated after 1.5hrs into treatment due to hypotension. Patient given total of 445ml NS bolus with little resolution. Decision made to terminate treatment.  -Patient stable this morning, No acute indication for dialysis today. Next treatment scheduled for tomorrow - Dialysis coordinator confirmed chair time at [facility] in [place] with transportation. Currently awaiting further discharge planning.   Altered mental status: Patient with history of chronic right MCA stroke and chronic atrophy.  Abnormal EEG. Levetiracetam at neurology recommendations of 500mg  twice daily with additional 250 mg dose after dialysis.  Very confused today    Hypertension: with hypertension urgency on admission. Home regimen includes losartan, amlodipine, carvedilol, hydralazine.   - IV labatelol PRN  -Managed with losartan, carvedilol  - BP 120/65   Anemia of chronic kidney disease:  Lab Results  Component Value Date   HGB 10.1 (L) 06/02/2021    Will continue to monitor Hgb and determine need for ESAs  Secondary Hyperparathyroidism:  Lab Results  Component Value Date   PTH 83 (H) 01/06/2016   CALCIUM 8.0 (L) 06/02/2021  CAION 1.04 (L) 02/24/2021   PHOS 4.3  05/25/2021     Phosphorus and calcium within acceptable range   LOS: 16   12/1/202211:48 AM

## 2021-06-02 NOTE — Progress Notes (Signed)
   06/02/21 0304  Assess: MEWS Score  Temp (!) 100.7 F (38.2 C)  BP (!) 98/42  Pulse Rate 89  Resp 16  SpO2 97 %  O2 Device Room Air  Assess: MEWS Score  MEWS Temp 1  MEWS Systolic 1  MEWS Pulse 0  MEWS RR 0  MEWS LOC 0  MEWS Score 2  MEWS Score Color Yellow  Assess: if the MEWS score is Yellow or Red  Were vital signs taken at a resting state? Yes  Focused Assessment Change from prior assessment (see assessment flowsheet)  Does the patient meet 2 or more of the SIRS criteria? Yes  Does the patient have a confirmed or suspected source of infection? Yes  Provider and Rapid Response Notified? No  MEWS guidelines implemented *See Row Information* Yes  Treat  MEWS Interventions Administered scheduled meds/treatments  Pain Scale PAINAD  Breathing 0  Negative Vocalization 1  Facial Expression 0  Body Language 0  Consolability 0  PAINAD Score 1  Take Vital Signs  Increase Vital Sign Frequency  Yellow: Q 2hr X 2 then Q 4hr X 2, if remains yellow, continue Q 4hrs  Escalate  MEWS: Escalate Yellow: discuss with charge nurse/RN and consider discussing with provider and RRT  Notify: Charge Nurse/RN  Name of Charge Nurse/RN Notified Marcella, RN  Date Charge Nurse/RN Notified 06/02/21  Time Charge Nurse/RN Notified 0315  Notify: Provider  Provider Name/Title Sharion Settler, NP  Date Provider Notified 06/02/21  Time Provider Notified 0330  Notification Type Page  Notification Reason Change in status  Assess: SIRS CRITERIA  SIRS Temperature  0  SIRS Pulse 0  SIRS Respirations  0  SIRS WBC 0  SIRS Score Sum  0

## 2021-06-02 NOTE — Progress Notes (Signed)
Patient has been accepted at [facility] MWF 11:40am. Patient can start on 12/2 as long as transportation is set up.

## 2021-06-02 NOTE — Progress Notes (Signed)
Dressing changed as ordered. Pt tolerated well. Will continue to monitor.

## 2021-06-02 NOTE — Progress Notes (Signed)
PROGRESS NOTE  Brianna Dodson  DOB: [birth year]  PCP: [name], MD AYT:[id number]  DOA: 05/16/2021  LOS: 20 days  Hospital Day: 34  Chief Complaint  Patient presents with   Altered Mental Status    EMS AMS    Brief narrative: Brianna Dodson is a 87 y.o. female with PMH significant for Alzheimer's dementia, multiple strokes in the past, A. fib on chronic anticoagulation, ESRD HD MWF, DM2, HTN, HLD, OSA, carotid stenosis, chronic anemia, chronic hepatitis C,  Patient was brought to the ED from home on 11/14 for agitation, altered mental status.  In the ED, patient was significantly agitated, restless and required multiple sedating agents.   CT head showed chronic right MCA infarct, chronic atrophic and ischemic changes. Admitted to hospitalist service.  During this hospitalization, patient's mental status has waxed and waned.  She was set to go to SNF on 05/27/2021 but patient's sister refused and requested the patient come home with home health.  Hospital bed and other equipment were delivered to pt's home as per request of the sister.  During this process, patient had fever spikes and hence discharge was held.   See below for details.   Subjective: Patient was seen and examined this morning.  Elderly African-American female.   Lying on bed.  Opens eyes to verbal command, acknowledges my presence.  However unable to follow any commands.  Gets agitated right away. T-max one 1.6 earlier this morning.  Assessment/Plan: Acute delirium on dementia with behavioral disturbances -Multifactorial etiology: Progressive dementia, malignant hypertension, history of recurrent stroke. -CT head did not show any acute intracranial abnormality and showed chronic right MCA infarct, chronic atrophic and ischemic changes. -Neurology and psychiatry consultations were obtained. -EEG showed cortical dysfunction arising from left frontotemporal region. -Continue remeron, risperdal, keppra, Haldol  prn.  Fever spikes Staph epidermidis bacteremia -Patient continues to have fever spikes, had a temperature of one 1.6 this morning. -Blood cultures sent on 11/29 is growing Staphylococcus epidermidis.  True infection versus contamination. -Started on IV Ancef.  ID consult appreciated.  ESRD HD MWF -Management per nephrology  Essential hypertension -Blood pressure was significantly elevated on arrival over 200.  Currently on  Coreg and losartan.  Blood pressure running low.  I will hold losartan.  Continue Coreg.  Type 2 diabetes mellitus Hyperglycemia -A1c 5.5 -Currently on sliding scale insulin with Accu-Cheks. Recent Labs  Lab 06/01/21 1223 06/01/21 1816 06/01/21 2039 06/02/21 0750 06/02/21 1137  GLUCAP 158* 115* 170* 215* 221*   Hx of CVA Hyperlipidemia -MRI of the brain showed chronic CVAs -continue Eliquis, zetia, statin    Unstageable mid coccyx wound - POA -Continue w/ wound care   Diarrhea -Patient has 4 bowel movements documented in last 24 hours.  We will switch from scheduled to as needed bowel regimen.  Mobility: Limited mobility because of dementia Living condition: Was living at home Goals of care:   Code Status: Full Code  Nutritional status: Body mass index is 21.06 kg/m. Nutrition Problem: Moderate Malnutrition Etiology: chronic illness (ESRD on HD, dementia, CVA) Signs/Symptoms: mild fat depletion, moderate fat depletion, moderate muscle depletion Diet:  Diet Order             DIET DYS 3 Room service appropriate? No; Fluid consistency: Thin  Diet effective now                  DVT prophylaxis:  Place and maintain sequential compression device Start: 05/20/21 1738 apixaban (ELIQUIS) tablet 2.5 mg Start: 05/17/21  1230 SCDs Start: 05/17/21 0144 apixaban (ELIQUIS) tablet 2.5 mg   Antimicrobials: IV Ancef Fluid: None Consultants: ID, nephrology  Family Communication: none at bedside.   Status is: Inpatient  Remains inpatient  appropriate because: Ongoing work-up for fever and bacteremia   Dispo: The patient is from: Home              Anticipated d/c is to: SNF versus home with home health              Patient currently is not medically stable to d/c.   Difficult to place patient No     Infusions:    ceFAZolin (ANCEF) IV      Scheduled Meds:  apixaban  2.5 mg Oral BID   ascorbic acid  500 mg Oral BID   atorvastatin  80 mg Oral Daily   carvedilol  6.25 mg Oral BID WC   Chlorhexidine Gluconate Cloth  6 each Topical Daily   Chlorhexidine Gluconate Cloth  6 each Topical Q0600   collagenase   Topical Daily   docusate sodium  200 mg Oral BID   ezetimibe  10 mg Oral Daily   feeding supplement  237 mL Oral TID BM   ferrous gluconate  324 mg Oral Q1500   insulin aspart  0-9 Units Subcutaneous TID WC   levETIRAcetam  250 mg Oral Once per day on Mon Wed Fri   levETIRAcetam  500 mg Oral BID   mouth rinse  15 mL Mouth Rinse BID   mirtazapine  7.5 mg Oral QHS   multivitamin  1 tablet Oral QHS   risperiDONE  0.5 mg Oral QHS    PRN meds: acetaminophen **OR** acetaminophen, haloperidol lactate, labetalol, ondansetron **OR** ondansetron (ZOFRAN) IV, polyethylene glycol, risperiDONE, senna-docusate   Antimicrobials: Anti-infectives (From admission, onward)    Start     Dose/Rate Route Frequency Ordered Stop   06/02/21 1800  ceFAZolin (ANCEF) IVPB 1 g/50 mL premix        1 g 100 mL/hr over 30 Minutes Intravenous Daily-1800 06/01/21 1018     06/01/21 1200  ceFAZolin (ANCEF) IVPB 2g/100 mL premix        2 g 200 mL/hr over 30 Minutes Intravenous  Once 06/01/21 1018 06/01/21 1330       Objective: Vitals:   06/02/21 0751 06/02/21 1140  BP: (!) 107/55 120/65  Pulse: 77 76  Resp:  19  Temp: 97.6 F (36.4 C) 97.8 F (36.6 C)  SpO2: 95% 96%    Intake/Output Summary (Last 24 hours) at 06/02/2021 1221 Last data filed at 06/02/2021 0933 Gross per 24 hour  Intake 30 ml  Output -463 ml  Net 493 ml    Filed Weights   05/18/21 1545 05/27/21 1628  Weight: 60.4 kg 57.4 kg   Weight change:  Body mass index is 21.06 kg/m.   Physical Exam: General exam: Elderly African-American female.  Not in physical distress at rest Skin: No rashes, lesions or ulcers. HEENT: Atraumatic, normocephalic, no obvious bleeding Lungs: Clear to auscultation bilaterally CVS: Regular rate and rhythm, no murmur GI/Abd soft, nondistended, bowel sound present CNS: Alert, awake, agitated on touch does not follow commands, demented at baseline Psychiatry: Sad affect Extremities: No pedal edema, no calf tenderness  Data Review: I have personally reviewed the laboratory data and studies available.  F/u labs ordered Unresulted Labs (From admission, onward)     Start     Ordered   06/03/21 4081  Basic metabolic panel  Daily,  R     Question:  Specimen collection method  Answer:  Lab=Lab collect   06/02/21 0820   06/03/21 0500  CBC with Differential/Platelet  Daily,   R     Question:  Specimen collection method  Answer:  Lab=Lab collect   06/02/21 0820   05/16/21 2125  CBC with Differential/Platelet  ONCE - STAT,   STAT        05/16/21 2124            Signed, Terrilee Croak, MD [facility] 06/02/2021

## 2021-06-03 ENCOUNTER — Inpatient Hospital Stay: Payer: Medicare Other

## 2021-06-03 ENCOUNTER — Inpatient Hospital Stay
Admit: 2021-06-03 | Discharge: 2021-06-03 | Disposition: A | Discharge status: Home / Self Care | Payer: Medicare Other | Attending: Infectious Diseases | Admitting: Infectious Diseases

## 2021-06-03 ENCOUNTER — Inpatient Hospital Stay (HOSPITAL_COMMUNITY)
Admit: 2021-06-03 | Discharge: 2021-06-03 | Disposition: A | Discharge status: Home / Self Care | Payer: Medicare Other | Attending: Infectious Diseases | Admitting: Infectious Diseases

## 2021-06-03 DIAGNOSIS — R7881 Bacteremia: Secondary | ICD-10-CM

## 2021-06-03 DIAGNOSIS — R451 Restlessness and agitation: Secondary | ICD-10-CM | POA: Diagnosis not present

## 2021-06-03 DIAGNOSIS — Z66 Do not resuscitate: Secondary | ICD-10-CM

## 2021-06-03 LAB — CBC WITH DIFFERENTIAL/PLATELET
Abs Immature Granulocytes: 0.41 10*3/uL — ABNORMAL HIGH (ref 0.00–0.07)
Abs Immature Granulocytes: 0.51 10*3/uL — ABNORMAL HIGH (ref 0.00–0.07)
Basophils Absolute: 0.2 10*3/uL — ABNORMAL HIGH (ref 0.0–0.1)
Basophils Absolute: 0.1 10*3/uL (ref 0.0–0.1)
Basophils Relative: 1 %
Basophils Relative: 1 %
Eosinophils Absolute: 0.1 10*3/uL (ref 0.0–0.5)
Eosinophils Absolute: 0.2 10*3/uL (ref 0.0–0.5)
Eosinophils Relative: 0 %
Eosinophils Relative: 1 %
HCT: 26.7 % — ABNORMAL LOW (ref 36.0–46.0)
HCT: 26.5 % — ABNORMAL LOW (ref 36.0–46.0)
Hemoglobin: 9.6 g/dL — ABNORMAL LOW (ref 12.0–15.0)
Hemoglobin: 9.3 g/dL — ABNORMAL LOW (ref 12.0–15.0)
Immature Granulocytes: 2 %
Immature Granulocytes: 3 %
Lymphocytes Relative: 3 %
Lymphocytes Relative: 5 %
Lymphs Abs: 0.5 10*3/uL — ABNORMAL LOW (ref 0.7–4.0)
Lymphs Abs: 1 10*3/uL (ref 0.7–4.0)
MCH: 30.5 pg (ref 26.0–34.0)
MCH: 29.4 pg (ref 26.0–34.0)
MCHC: 36 g/dL (ref 30.0–36.0)
MCHC: 35.1 g/dL (ref 30.0–36.0)
MCV: 84.8 fL (ref 80.0–100.0)
MCV: 83.9 fL (ref 80.0–100.0)
Monocytes Absolute: 1.1 10*3/uL — ABNORMAL HIGH (ref 0.1–1.0)
Monocytes Absolute: 0.7 10*3/uL (ref 0.1–1.0)
Monocytes Relative: 6 %
Monocytes Relative: 4 %
Neutro Abs: 15.9 10*3/uL — ABNORMAL HIGH (ref 1.7–7.7)
Neutro Abs: 15.8 10*3/uL — ABNORMAL HIGH (ref 1.7–7.7)
Neutrophils Relative %: 88 %
Neutrophils Relative %: 86 %
Platelets: 87 10*3/uL — ABNORMAL LOW (ref 150–400)
Platelets: 79 10*3/uL — ABNORMAL LOW (ref 150–400)
RBC: 3.15 MIL/uL — ABNORMAL LOW (ref 3.87–5.11)
RBC: 3.16 MIL/uL — ABNORMAL LOW (ref 3.87–5.11)
RDW: 14 % (ref 11.5–15.5)
RDW: 13.9 % (ref 11.5–15.5)
Smear Review: NORMAL
WBC: 18.1 10*3/uL — ABNORMAL HIGH (ref 4.0–10.5)
WBC: 18.3 10*3/uL — ABNORMAL HIGH (ref 4.0–10.5)
nRBC: 0 % (ref 0.0–0.2)
nRBC: 0 % (ref 0.0–0.2)

## 2021-06-03 LAB — RENAL FUNCTION PANEL
Albumin: 2.2 g/dL — ABNORMAL LOW (ref 3.5–5.0)
Anion gap: 14 (ref 5–15)
BUN: 64 mg/dL — ABNORMAL HIGH (ref 8–23)
CO2: 21 mmol/L — ABNORMAL LOW (ref 22–32)
Calcium: 8 mg/dL — ABNORMAL LOW (ref 8.9–10.3)
Chloride: 98 mmol/L (ref 98–111)
Creatinine, Ser: 5.81 mg/dL — ABNORMAL HIGH (ref 0.44–1.00)
GFR, Estimated: 7 mL/min — ABNORMAL LOW (ref >60)
Glucose, Bld: 161 mg/dL — ABNORMAL HIGH (ref 70–99)
Phosphorus: 2.9 mg/dL (ref 2.5–4.6)
Potassium: 4.2 mmol/L (ref 3.5–5.1)
Sodium: 133 mmol/L — ABNORMAL LOW (ref 135–145)

## 2021-06-03 LAB — ECHOCARDIOGRAM COMPLETE
AR max vel: 3 cm2
AV Area VTI: 3.13 cm2
AV Area mean vel: 2.84 cm2
AV Mean grad: 3 mmHg
AV Peak grad: 4.2 mmHg
Ao pk vel: 1.03 m/s
Area-P 1/2: 3.93 cm2
Height: 65 in
S' Lateral: 2.84 cm
Weight: 2024.7 oz

## 2021-06-03 LAB — BLOOD GAS, VENOUS
Acid-Base Excess: 2.7 mmol/L — ABNORMAL HIGH (ref 0.0–2.0)
Bicarbonate: 25.5 mmol/L (ref 20.0–28.0)
O2 Saturation: 94.6 %
Patient temperature: 37
pCO2, Ven: 32 mmHg — ABNORMAL LOW (ref 44.0–60.0)
pH, Ven: 7.51 — ABNORMAL HIGH (ref 7.250–7.430)
pO2, Ven: 66 mmHg — ABNORMAL HIGH (ref 32.0–45.0)

## 2021-06-03 LAB — BASIC METABOLIC PANEL
Anion gap: 15 (ref 5–15)
BUN: 72 mg/dL — ABNORMAL HIGH (ref 8–23)
CO2: 23 mmol/L (ref 22–32)
Calcium: 8.3 mg/dL — ABNORMAL LOW (ref 8.9–10.3)
Chloride: 97 mmol/L — ABNORMAL LOW (ref 98–111)
Creatinine, Ser: 5.86 mg/dL — ABNORMAL HIGH (ref 0.44–1.00)
GFR, Estimated: 7 mL/min — ABNORMAL LOW (ref >60)
Glucose, Bld: 178 mg/dL — ABNORMAL HIGH (ref 70–99)
Potassium: 4.3 mmol/L (ref 3.5–5.1)
Sodium: 135 mmol/L (ref 135–145)

## 2021-06-03 LAB — CULTURE, BLOOD (ROUTINE X 2): Special Requests: ADEQUATE

## 2021-06-03 LAB — GLUCOSE, CAPILLARY
Glucose-Capillary: 191 mg/dL — ABNORMAL HIGH (ref 70–99)
Glucose-Capillary: 186 mg/dL — ABNORMAL HIGH (ref 70–99)
Glucose-Capillary: 161 mg/dL — ABNORMAL HIGH (ref 70–99)

## 2021-06-03 LAB — LACTIC ACID, PLASMA
Lactic Acid, Venous: 1.8 mmol/L (ref 0.5–1.9)
Lactic Acid, Venous: 2.4 mmol/L (ref 0.5–1.9)

## 2021-06-03 MED ORDER — ALBUMIN HUMAN 25 % IV SOLN
25.0000 g | Freq: Once | INTRAVENOUS | Status: AC
  Administered 2021-06-03: 25 g via INTRAVENOUS
  Filled 2021-06-03: qty 100

## 2021-06-03 MED ORDER — MIDODRINE HCL 5 MG PO TABS: 5.0000 mg | ORAL_TABLET | Freq: Once | ORAL | Status: DC

## 2021-06-03 MED ORDER — SCOPOLAMINE 1 MG/3DAYS TD PT72
1.0000 | MEDICATED_PATCH | TRANSDERMAL | Status: DC
  Administered 2021-06-03: 1.5 mg via TRANSDERMAL
  Filled 2021-06-03: qty 1

## 2021-06-03 MED ORDER — MORPHINE SULFATE (PF) 2 MG/ML IV SOLN
1.0000 mg | INTRAVENOUS | Status: DC | PRN
  Administered 2021-06-03 – 2021-06-05 (×3): 1 mg via INTRAVENOUS
  Filled 2021-06-03 (×3): qty 1

## 2021-06-03 MED ORDER — SODIUM CHLORIDE 0.9 % IV BOLUS
250.0000 mL | Freq: Once | INTRAVENOUS | Status: AC
  Administered 2021-06-03: 250 mL via INTRAVENOUS

## 2021-06-03 MED ORDER — LORAZEPAM 2 MG/ML PO CONC
1.0000 mg | ORAL | Status: DC | PRN
  Administered 2021-06-03: 1 mg via SUBLINGUAL
  Filled 2021-06-03: qty 1

## 2021-06-03 MED ORDER — LORAZEPAM 1 MG PO TABS: 1.0000 mg | ORAL_TABLET | ORAL | Status: DC | PRN

## 2021-06-03 MED ORDER — LORAZEPAM 2 MG/ML IJ SOLN
1.0000 mg | INTRAMUSCULAR | Status: DC | PRN
  Administered 2021-06-03: 1 mg via INTRAVENOUS
  Filled 2021-06-03: qty 1

## 2021-06-03 MED ORDER — SODIUM CHLORIDE 0.9 % IV BOLUS
500.0000 mL | Freq: Once | INTRAVENOUS | Status: AC
Start: 2021-06-03 — End: 2021-06-03
  Administered 2021-06-03: 500 mL via INTRAVENOUS

## 2021-06-03 NOTE — Progress Notes (Addendum)
Cross Cover Patient with MSSA bacteremia and ESRD with increased lethargy and relative hypotension overnight.    Worsening leukocytosis Mild  bump in lactic acid to 2.4 Physically she is responding to noxious stimuli. Volume status actually seems dry. Very low albumin level. Patient treated with NS bolus and albumin. Lactic acid improved. ID and nephrology following.   Change of HD catheter likely needed Echo ordered by ID

## 2021-06-03 NOTE — TOC Progression Note (Signed)
Transition of Care [facility]) - Progression Note    Patient Details  Name: Brianna Dodson MRN: [id number] Date of Birth: 1933-07-20  Transition of [facility]) CM/SW [place], LCSW Phone Number: 06/03/2021, 11:47 AM  Clinical Narrative:   Per MD, patient now DNR. Sister wants hospice. Sister has HCPOA paperwork at the bedside noting Brianna Dodson as primary and daughter Brianna Dodson as secondary. Paperwork was completed and notarized in May 2022. Made a copy and put in chart. Sister confirmed plan for hospice home if stable for transfer once bed available. She prefers the [facility] facility. Referral made to [facility] "Loren Racer, RN with [facility]. Sister said patient's daughter Brianna Dodson will update husband.  Expected Discharge Plan: [place] Barriers to Discharge: Equipment Delay [facility] bed was not delived on 11/26 as arranged on 05/27/21. [employer] was contacted twice on 11/26 and it was to be prioritized as patient's needed EMS transport to home.)  Expected Discharge Plan and Services Expected Discharge Plan: [place] Acute Care Choice: Resumption of Svcs/PTA Provider Living arrangements for the past 2 months: Single Family Home                                       Social Determinants of Health (SDOH) Interventions    Readmission Risk Interventions Readmission Risk Prevention Plan 05/18/2021 03/03/2021 03/16/2019  Transportation Screening Complete Complete Complete  PCP or Specialist Appt within 5-7 Days - - Complete  PCP or Specialist Appt within 3-5 Days Complete Complete -  Home Care Screening - - Complete  Medication Review (RN CM) - - Complete  HRI or Home Care Consult Complete Complete -  Social Work Consult for [place] Planning/Counseling Complete Not Complete -  SW consult not completed comments - RNCM assigned to patient -  Palliative Care Screening Not Applicable Not Applicable -  Medication Review (RN Care  Manager) Complete Complete -  Some recent data might be hidden

## 2021-06-03 NOTE — Progress Notes (Signed)
PROGRESS NOTE  Brianna Dodson  DOB: 1934/01/14  PCP: [name], MD YIR:[id number]  DOA: 05/16/2021  LOS: 42 days  Hospital Day: 19  Chief Complaint  Patient presents with   Altered Mental Status    EMS AMS    Brief narrative: Brianna Dodson is a 87 y.o. female with PMH significant for Alzheimer's dementia, multiple strokes in the past, A. fib on chronic anticoagulation, ESRD HD MWF, DM2, HTN, HLD, OSA, carotid stenosis, chronic anemia, chronic hepatitis C,  Patient was brought to the ED from home on 11/14 for agitation, altered mental status.  In the ED, patient was significantly agitated, restless and required multiple sedating agents.   CT head showed chronic right MCA infarct, chronic atrophic and ischemic changes. Admitted to hospitalist service.  During this hospitalization, patient's mental status has waxed and waned.  She was set to go to SNF on 05/27/2021 but patient's sister refused and requested the patient come home with home health.  Hospital bed and other equipment were delivered to pt's home as per request of the sister.  During this process, patient had fever spikes and hence discharge was held.   Blood culture grew staph epidermidis. Over the course of next few days, patient continued to decline with his spikes of fever, low blood pressure, poor mental status. On 12/2, I discussed with patient's POA/sister Ms. Margaret at bedside.  We had extensive discussion about patient's poor clinical status, poor prognosis, risk of prolonged suffering and futility of any aggressive measures.  Ms. [name] and other family members agreed and wanted to pursue comfort care measures. CODE STATUS updated to DNR/DNI status.  Subjective: Patient was seen and examined this morning.   Elderly African-American female.   Mumbling.  Unable to open eyes on command.  Family at bedside.    Assessment/Plan: Comfort care status -Elderly female ESRD on HD who has been physically and  mentally declining for the last several weeks and was not able to comply to outpatient dialysis.  She was admitted for agitation and altered mental status. -Clinically worsened.  After extensive discussion with family, patient is now comfort care -Regular medicines withheld.  Ordered for morphine, Ativan, scopolamine patch -CODE STATUS updated to DNR/DNI. -Social worker consulted for hospice placement.  Medical issues addressed in the hospital as below Acute delirium on dementia with behavioral disturbances Staph epidermidis bacteremia ESRD HD  Essential hypertension Type 2 diabetes mellitus Hx of CVA Diarrhea  Goals of care:   Code Status: DNR  Nutritional status: Body mass index is 21.06 kg/m. Nutrition Problem: Moderate Malnutrition Etiology: chronic illness (ESRD on HD, dementia, CVA) Signs/Symptoms: mild fat depletion, moderate fat depletion, moderate muscle depletion Diet:  Diet Order             DIET DYS 3 Room service appropriate? No; Fluid consistency: Thin  Diet effective now                  DVT prophylaxis:  Place and maintain sequential compression device Start: 05/20/21 1738 SCDs Start: 05/17/21 0144   Family Communication: Family at bedside.   Dispo: The patient is from: Home              Anticipated d/c is to: Poor prognosis.  Likely to suffer in-hospital mortality.  Residential hospice if prolonged              Patient currently is not medically stable to d/c.   Difficult to place patient No     Infusions:  Scheduled Meds:  Chlorhexidine Gluconate Cloth  6 each Topical Daily   Chlorhexidine Gluconate Cloth  6 each Topical Q0600   collagenase   Topical Daily   feeding supplement  237 mL Oral TID BM   mouth rinse  15 mL Mouth Rinse BID   mirtazapine  7.5 mg Oral QHS   risperiDONE  0.5 mg Oral QHS   scopolamine  1 patch Transdermal Q72H    PRN meds: acetaminophen **OR** acetaminophen, haloperidol lactate, LORazepam **OR** LORazepam  **OR** LORazepam, morphine injection, ondansetron **OR** ondansetron (ZOFRAN) IV, polyethylene glycol, risperiDONE   Antimicrobials: Anti-infectives (From admission, onward)    Start     Dose/Rate Route Frequency Ordered Stop   06/02/21 2200  metroNIDAZOLE (FLAGYL) IVPB 500 mg  Status:  Discontinued        500 mg 100 mL/hr over 60 Minutes Intravenous Every 12 hours 06/02/21 2057 06/03/21 1139   06/02/21 1800  ceFAZolin (ANCEF) IVPB 1 g/50 mL premix  Status:  Discontinued        1 g 100 mL/hr over 30 Minutes Intravenous Daily-1800 06/01/21 1018 06/03/21 1139   06/01/21 1200  ceFAZolin (ANCEF) IVPB 2g/100 mL premix        2 g 200 mL/hr over 30 Minutes Intravenous  Once 06/01/21 1018 06/01/21 1330       Objective: Vitals:   06/03/21 1000 06/03/21 1120  BP: (!) 81/49 (!) 79/48  Pulse:  96  Resp:  18  Temp:  (!) 97.3 F (36.3 C)  SpO2:  100%    Intake/Output Summary (Last 24 hours) at 06/03/2021 1338 Last data filed at 06/03/2021 1036 Gross per 24 hour  Intake 626.85 ml  Output --  Net 626.85 ml   Filed Weights   05/18/21 1545 05/27/21 1628  Weight: 60.4 kg 57.4 kg   Weight change:  Body mass index is 21.06 kg/m.   Physical Exam: General exam: Elderly African-American female.  Not in physical distress at rest.  Try to open eyes on sternal cast but unable to follow commands, involuntary mumbling.  Data Review: I have personally reviewed the laboratory data and studies available.   Signed, Terrilee Croak, MD [facility] 06/03/2021

## 2021-06-03 NOTE — Progress Notes (Signed)
OT Cancellation Note  Patient Details Name: Brianna Dodson MRN: [id number] DOB: April 07, 1934   Cancelled Treatment:    Reason Eval/Treat Not Completed: Other (comment). Per staff report, pt is going on comfort care. OT will SIGN OFF at this time.   Darleen Crocker, MS, OTR/L , CBIS ascom [phone]  06/03/21, 12:59 PM

## 2021-06-03 NOTE — Progress Notes (Signed)
Nutrition Brief Note  Chart reviewed. Pt now transitioning to comfort care.  No further nutrition interventions planned at this time.  Please re-consult as needed.   Spyridon Hornstein W, RD, LDN, CDCES Registered Dietitian II Certified Diabetes Care and Education Specialist Please refer to [facility] for RD and/or RD on-call/weekend/after hours pager   

## 2021-06-03 NOTE — Progress Notes (Signed)
[facility]  ROUNDING NOTE   Subjective:   Patient seen resting in bed Somnolent Sister and family friend at bedside  BP 81/51  Per family, they are considering Hospice care  Objective:  Vital signs in last 24 hours:  Temp:  [97.3 F (36.3 C)-100.3 F (37.9 C)] 97.3 F (36.3 C) (12/02 1120) Pulse Rate:  [64-98] 96 (12/02 1120) Resp:  [18-28] 18 (12/02 1120) BP: (69-120)/(35-65) 79/48 (12/02 1120) SpO2:  [93 %-100 %] 100 % (12/02 1120)  Weight change:  Filed Weights   05/18/21 1545 05/27/21 1628  Weight: 60.4 kg 57.4 kg    Intake/Output: I/O last 3 completed shifts: In: 656.9 [P.O.:30; IV Piggyback:626.9] Out: -    Intake/Output this shift:  No intake/output data recorded.  Physical Exam: General: no acute distress, somnolent  Head: Normocephalic, atraumatic. Moist oral mucosal membranes  Lungs:  Rhonchi throughout, cough  Heart: No rub or gallop  Abdomen:  Soft, nontender  Extremities:  No peripheral edema.  Neurologic: Lethargic Left sided weakness.   Skin: No lesions  Access: Right IJ permcath    Basic Metabolic Panel: Recent Labs  Lab 05/31/21 0657 06/01/21 1215 06/02/21 0859 06/03/21 0015 06/03/21 0323  NA 135 132* 131* 133* 135  K 3.8 4.0 3.9 4.2 4.3  CL 99 97* 95* 98 97*  CO2 24 23 23  21* 23  GLUCOSE 150* 148* 217* 161* 178*  BUN 43* 67* 52* 64* 72*  CREATININE 4.84* 6.28* 5.05* 5.81* 5.86*  CALCIUM 8.2* 8.3* 8.0* 8.0* 8.3*  PHOS  --   --   --  2.9  --      Liver Function Tests: Recent Labs  Lab 06/03/21 0015  ALBUMIN 2.2*    No results for input(s): LIPASE, AMYLASE in the last 168 hours. No results for input(s): AMMONIA in the last 168 hours.   CBC: Recent Labs  Lab 05/31/21 0657 06/01/21 1215 06/02/21 0859 06/03/21 0015 06/03/21 0323  WBC 14.7* 15.1* 18.0* 18.1* 18.3*  NEUTROABS  --   --  16.3* 15.9* 15.8*  HGB 11.2* 11.0* 10.1* 9.6* 9.3*  HCT 31.6* 31.5* 28.7* 26.7* 26.5*  MCV 83.6 84.0 84.2 84.8 83.9   PLT 115* 117* 100* 87* 79*     Cardiac Enzymes: No results for input(s): CKTOTAL, CKMB, CKMBINDEX, TROPONINI in the last 168 hours.  BNP: Invalid input(s): POCBNP  CBG: Recent Labs  Lab 06/02/21 1609 06/02/21 2042 06/02/21 2332 06/03/21 0758 06/03/21 1122  GLUCAP 121* 135* 157* 191* 186*     Microbiology: Results for orders placed or performed during the hospital encounter of 05/16/21  Resp Panel by RT-PCR (Flu A&B, Covid) Nasopharyngeal Swab     Status: None   Collection Time: 05/16/21 10:07 PM   Specimen: Nasopharyngeal Swab; Nasopharyngeal(NP) swabs in vial transport medium  Result Value Ref Range Status   SARS Coronavirus 2 by RT PCR NEGATIVE NEGATIVE Final    Comment: (NOTE) SARS-CoV-2 target nucleic acids are NOT DETECTED.  The SARS-CoV-2 RNA is generally detectable in upper respiratory specimens during the acute phase of infection. The lowest concentration of SARS-CoV-2 viral copies this assay can detect is 138 copies/mL. A negative result does not preclude SARS-Cov-2 infection and should not be used as the sole basis for treatment or other patient management decisions. A negative result may occur with  improper specimen collection/handling, submission of specimen other than nasopharyngeal swab, presence of viral mutation(s) within the areas targeted by this assay, and inadequate number of viral copies(<138 copies/mL). A negative  result must be combined with clinical observations, patient history, and epidemiological information. The expected result is Negative.  Fact Sheet for Patients:  EntrepreneurPulse.com.au  Fact Sheet for Healthcare Providers:  IncredibleEmployment.be  This test is no t yet approved or cleared by the [place] FDA and  has been authorized for detection and/or diagnosis of SARS-CoV-2 by FDA under an Emergency Use Authorization (EUA). This EUA will remain  in effect (meaning this test can be  used) for the duration of the COVID-19 declaration under Section 564(b)(1) of the Act, 21 U.S.C.section 360bbb-3(b)(1), unless the authorization is terminated  or revoked sooner.       Influenza A by PCR NEGATIVE NEGATIVE Final   Influenza B by PCR NEGATIVE NEGATIVE Final    Comment: (NOTE) The Xpert Xpress SARS-CoV-2/FLU/RSV plus assay is intended as an aid in the diagnosis of influenza from Nasopharyngeal swab specimens and should not be used as a sole basis for treatment. Nasal washings and aspirates are unacceptable for Xpert Xpress SARS-CoV-2/FLU/RSV testing.  Fact Sheet for Patients: EntrepreneurPulse.com.au  Fact Sheet for Healthcare Providers: IncredibleEmployment.be  This test is not yet approved or cleared by the [place] FDA and has been authorized for detection and/or diagnosis of SARS-CoV-2 by FDA under an Emergency Use Authorization (EUA). This EUA will remain in effect (meaning this test can be used) for the duration of the COVID-19 declaration under Section 564(b)(1) of the Act, 21 U.S.C. section 360bbb-3(b)(1), unless the authorization is terminated or revoked.  Performed at [facility], [address], [place], [place] [address]   Urine Culture     Status: Abnormal   Collection Time: 05/16/21 10:07 PM   Specimen: In/Out Cath Urine  Result Value Ref Range Status   Specimen Description   Final    IN/OUT CATH URINE Performed at [facility], [address] [place], [place] [address]    Special Requests   Final    NONE Performed at [facility], [address] [place], [place] [address]    Culture (A)  Final    >=100,000 COLONIES/mL GARDNERELLA VAGINALIS Standardized susceptibility testing for this organism is not available. 80,000 COLONIES/mL DIPHTHEROIDS(CORYNEBACTERIUM SPECIES)    Report Status 05/19/2021 FINAL  Final  Resp Panel by RT-PCR (Flu A&B, Covid) Nasopharyngeal Swab      Status: None   Collection Time: 05/27/21 12:40 PM   Specimen: Nasopharyngeal Swab; Nasopharyngeal(NP) swabs in vial transport medium  Result Value Ref Range Status   SARS Coronavirus 2 by RT PCR NEGATIVE NEGATIVE Final    Comment: (NOTE) SARS-CoV-2 target nucleic acids are NOT DETECTED.  The SARS-CoV-2 RNA is generally detectable in upper respiratory specimens during the acute phase of infection. The lowest concentration of SARS-CoV-2 viral copies this assay can detect is 138 copies/mL. A negative result does not preclude SARS-Cov-2 infection and should not be used as the sole basis for treatment or other patient management decisions. A negative result may occur with  improper specimen collection/handling, submission of specimen other than nasopharyngeal swab, presence of viral mutation(s) within the areas targeted by this assay, and inadequate number of viral copies(<138 copies/mL). A negative result must be combined with clinical observations, patient history, and epidemiological information. The expected result is Negative.  Fact Sheet for Patients:  EntrepreneurPulse.com.au  Fact Sheet for Healthcare Providers:  IncredibleEmployment.be  This test is no t yet approved or cleared by the [place] FDA and  has been authorized for detection and/or diagnosis of SARS-CoV-2 by FDA under an  Emergency Use Authorization (EUA). This EUA will remain  in effect (meaning this test can be used) for the duration of the COVID-19 declaration under Section 564(b)(1) of the Act, 21 U.S.C.section 360bbb-3(b)(1), unless the authorization is terminated  or revoked sooner.       Influenza A by PCR NEGATIVE NEGATIVE Final   Influenza B by PCR NEGATIVE NEGATIVE Final    Comment: (NOTE) The Xpert Xpress SARS-CoV-2/FLU/RSV plus assay is intended as an aid in the diagnosis of influenza from Nasopharyngeal swab specimens and should not be used as a sole basis  for treatment. Nasal washings and aspirates are unacceptable for Xpert Xpress SARS-CoV-2/FLU/RSV testing.  Fact Sheet for Patients: EntrepreneurPulse.com.au  Fact Sheet for Healthcare Providers: IncredibleEmployment.be  This test is not yet approved or cleared by the [place] FDA and has been authorized for detection and/or diagnosis of SARS-CoV-2 by FDA under an Emergency Use Authorization (EUA). This EUA will remain in effect (meaning this test can be used) for the duration of the COVID-19 declaration under Section 564(b)(1) of the Act, 21 U.S.C. section 360bbb-3(b)(1), unless the authorization is terminated or revoked.  Performed at [facility], [address] [place], [place] [address]   CULTURE, BLOOD (ROUTINE X 2) w Reflex to ID Panel     Status: None (Preliminary result)   Collection Time: 05/31/21 10:21 AM   Specimen: BLOOD  Result Value Ref Range Status   Specimen Description   Final    BLOOD BLOOD LEFT HAND Performed at [facility], [address] [place], [place] [address]    Special Requests   Final    BOTTLES DRAWN AEROBIC AND ANAEROBIC Blood Culture adequate volume Performed at [facility], [address], [place], [place] [address]    Culture  Setup Time   Final    AEROBIC BOTTLE ONLY GRAM POSITIVE COCCI CRITICAL RESULT CALLED TO, READ BACK BY AND VERIFIED WITH: [facility] MITCHELL 06/01/21 4163 KLW Performed at [facility], [address] [place], [place] [address]    Culture   Final    GRAM POSITIVE COCCI IDENTIFICATION TO FOLLOW Performed at [facility], [place] [address], [place], [place] [address]    Report Status PENDING  Incomplete  CULTURE, BLOOD (ROUTINE X 2) w Reflex to ID Panel     Status: Abnormal (Preliminary result)   Collection Time: 05/31/21 11:34 AM   Specimen: BLOOD  Result Value Ref Range Status   Specimen Description   Final    BLOOD BLOOD LEFT  HAND Performed at [facility], [address] [place], [place] [address]    Special Requests   Final    BOTTLES DRAWN AEROBIC AND ANAEROBIC Blood Culture adequate volume Performed at [facility], [address] [place], [place] [address]    Culture  Setup Time   Final    GRAM POSITIVE COCCI AEROBIC BOTTLE ONLY CRITICAL RESULT CALLED TO, READ BACK BY AND VERIFIED WITH: DEVON MITCHELL 06/01/21 0928 KLW    Culture (A)  Final    STAPHYLOCOCCUS EPIDERMIDIS SUSCEPTIBILITIES TO FOLLOW Performed at [facility], [place] [address], [place],  [address]    Report Status PENDING  Incomplete  Blood Culture ID Panel (Reflexed)     Status: Abnormal   Collection Time: 05/31/21 11:34 AM  Result Value Ref Range Status   Enterococcus faecalis NOT DETECTED NOT DETECTED Final   Enterococcus Faecium NOT DETECTED NOT DETECTED Final   Listeria monocytogenes NOT DETECTED NOT DETECTED  Final   Staphylococcus species DETECTED (A) NOT DETECTED Final    Comment: CRITICAL RESULT CALLED TO, READ BACK BY AND VERIFIED WITH: DEVAN MITCHELL 06/01/21 0928 KLW    Staphylococcus aureus (BCID) NOT DETECTED NOT DETECTED Final   Staphylococcus epidermidis DETECTED (A) NOT DETECTED Final    Comment: CRITICAL RESULT CALLED TO, READ BACK BY AND VERIFIED WITH: DEVAN MITCHELL 06/01/21 0928 KLW    Staphylococcus lugdunensis NOT DETECTED NOT DETECTED Final   Streptococcus species NOT DETECTED NOT DETECTED Final   Streptococcus agalactiae NOT DETECTED NOT DETECTED Final   Streptococcus pneumoniae NOT DETECTED NOT DETECTED Final   Streptococcus pyogenes NOT DETECTED NOT DETECTED Final   A.calcoaceticus-baumannii NOT DETECTED NOT DETECTED Final   Bacteroides fragilis NOT DETECTED NOT DETECTED Final   Enterobacterales NOT DETECTED NOT DETECTED Final   Enterobacter cloacae complex NOT DETECTED NOT DETECTED Final   Escherichia coli NOT DETECTED NOT DETECTED Final   Klebsiella aerogenes NOT  DETECTED NOT DETECTED Final   Klebsiella oxytoca NOT DETECTED NOT DETECTED Final   Klebsiella pneumoniae NOT DETECTED NOT DETECTED Final   Proteus species NOT DETECTED NOT DETECTED Final   Salmonella species NOT DETECTED NOT DETECTED Final   Serratia marcescens NOT DETECTED NOT DETECTED Final   Haemophilus influenzae NOT DETECTED NOT DETECTED Final   Neisseria meningitidis NOT DETECTED NOT DETECTED Final   Pseudomonas aeruginosa NOT DETECTED NOT DETECTED Final   Stenotrophomonas maltophilia NOT DETECTED NOT DETECTED Final   Candida albicans NOT DETECTED NOT DETECTED Final   Candida auris NOT DETECTED NOT DETECTED Final   Candida glabrata NOT DETECTED NOT DETECTED Final   Candida krusei NOT DETECTED NOT DETECTED Final   Candida parapsilosis NOT DETECTED NOT DETECTED Final   Candida tropicalis NOT DETECTED NOT DETECTED Final   Cryptococcus neoformans/gattii NOT DETECTED NOT DETECTED Final   Methicillin resistance mecA/C NOT DETECTED NOT DETECTED Final    Comment: Performed at [facility], [address] [place], [place] [address]  CULTURE, BLOOD (ROUTINE X 2) w Reflex to ID Panel     Status: None (Preliminary result)   Collection Time: 06/01/21 12:15 PM   Specimen: BLOOD  Result Value Ref Range Status   Specimen Description BLOOD BLOOD LEFT HAND  Final   Special Requests   Final    BOTTLES DRAWN AEROBIC AND ANAEROBIC Blood Culture adequate volume   Culture  Setup Time PENDING  Incomplete   Culture   Final    NO GROWTH 2 DAYS Performed at [facility], [address] [place], [place] [address]    Report Status PENDING  Incomplete  CULTURE, BLOOD (ROUTINE X 2) w Reflex to ID Panel     Status: None (Preliminary result)   Collection Time: 06/01/21  1:16 PM   Specimen: BLOOD  Result Value Ref Range Status   Specimen Description BLOOD BLOOD LEFT HAND  Final   Special Requests   Final    BOTTLES DRAWN AEROBIC AND ANAEROBIC Blood Culture results may not be optimal  due to an inadequate volume of blood received in culture bottles   Culture   Final    NO GROWTH 2 DAYS Performed at [facility], [address] [place],  [address]    Report Status PENDING  Incomplete  Aerobic Culture w Gram Stain (superficial specimen)     Status: None (Preliminary result)   Collection Time: 06/01/21  6:08 PM   Specimen: Wound  Result Value Ref Range Status   Specimen Description   Final  WOUND Performed at [facility], [address], [place], [place] [address]    Special Requests   Final    NONE Performed at [facility], [place], [place] [address]    Gram Stain   Final    NO SQUAMOUS EPITHELIAL CELLS SEEN FEW WBC SEEN MODERATE GRAM POSITIVE RODS MODERATE GRAM POSITIVE COCCI    Culture   Final    FEW GRAM NEGATIVE RODS CULTURE REINCUBATED FOR BETTER GROWTH SUSCEPTIBILITIES TO FOLLOW Performed at [facility], [place] [address] [place], [place] [address]    Report Status PENDING  Incomplete    Coagulation Studies: No results for input(s): LABPROT, INR in the last 72 hours.  Urinalysis: No results for input(s): COLORURINE, LABSPEC, PHURINE, GLUCOSEU, HGBUR, BILIRUBINUR, KETONESUR, PROTEINUR, UROBILINOGEN, NITRITE, LEUKOCYTESUR in the last 72 hours.  Invalid input(s): APPERANCEUR     Imaging: DG Chest Port 1 View  Result Date: 06/03/2021 CLINICAL DATA:  Fever. EXAM: PORTABLE CHEST 1 VIEW COMPARISON:  May 31, 2021 FINDINGS: There is stable right-sided venous catheter positioning. Stable areas of linear scarring and/or atelectasis are seen within the mid right lung and left lung base with stable elevation of the right hemidiaphragm. There is no evidence of a pleural effusion or pneumothorax. The heart size and mediastinal contours are within normal limits. A radiopaque loop recorder device is present. Moderate severity calcification of the aortic arch is noted. Multilevel degenerative  changes seen throughout the thoracic spine. IMPRESSION: Stable areas of linear scarring and/or atelectasis within the mid right lung and left lung base. Electronically Signed   By: Virgina Norfolk M.D.   On: 06/03/2021 00:49     Medications:     ceFAZolin (ANCEF) IV 1 g (06/02/21 1736)   metronidazole 500 mg (06/03/21 1021)     apixaban  2.5 mg Oral BID   ascorbic acid  500 mg Oral BID   atorvastatin  80 mg Oral Daily   carvedilol  6.25 mg Oral BID WC   Chlorhexidine Gluconate Cloth  6 each Topical Daily   Chlorhexidine Gluconate Cloth  6 each Topical Q0600   collagenase   Topical Daily   docusate sodium  200 mg Oral BID   ezetimibe  10 mg Oral Daily   feeding supplement  237 mL Oral TID BM   ferrous gluconate  324 mg Oral Q1500   insulin aspart  0-9 Units Subcutaneous TID WC   levETIRAcetam  250 mg Oral Once per day on Mon Wed Fri   levETIRAcetam  500 mg Oral BID   mouth rinse  15 mL Mouth Rinse BID   midodrine  5 mg Oral Once   mirtazapine  7.5 mg Oral QHS   multivitamin  1 tablet Oral QHS   risperiDONE  0.5 mg Oral QHS   acetaminophen **OR** acetaminophen, haloperidol lactate, labetalol, ondansetron **OR** ondansetron (ZOFRAN) IV, polyethylene glycol, risperiDONE, senna-docusate  Assessment/ Plan:  [name] is a 87 y.o. black female with end stage renal disease on hemodialysis, hypertension, CVA, vascular dementia, hyperlipidemia, diabetes mellitus type II, atrial fibrillation who is admitted to [facility] on 05/16/2021 for Confusion [R41.0] Agitation [R45.1]   [facility] M[facility] RIJ permcath 65.5kg   End Stage Renal Disease requiring hemodialysis:  - Due to patient being hypotensive and family considering Hospice care, will hold dialysis today.    Altered mental status: Patient with history of chronic right MCA stroke and chronic atrophy.  Abnormal EEG. Levetiracetam at neurology recommendations  of 500mg  twice daily with additional 250 mg dose after  dialysis.      Hypertension: with hypertension urgency on admission. Home regimen includes losartan, amlodipine, carvedilol, hydralazine.  Now hypotensive at 81/50 -All medications held     Anemia of chronic kidney disease:  Lab Results  Component Value Date   HGB 9.3 (L) 06/03/2021    Hemoglobin within acceptable ramge  Secondary Hyperparathyroidism:  Lab Results  Component Value Date   PTH 83 (H) 01/06/2016   CALCIUM 8.3 (L) 06/03/2021   CAION 1.04 (L) 02/24/2021   PHOS 2.9 06/03/2021     Monitoring bone mineral during this admission   LOS: [place] 12/2/202211:33 AM

## 2021-06-03 NOTE — Progress Notes (Signed)
At 2315 pt was lethargic and unable to follow command, VS taken, BP 89/39 map of 55. Pt blood glucose was 161. On call provider notified, order received for STAT BMP, CBC and ABG. Stat  chest X-ray and another order to give Albumin 25 g IV  and 250 mL  bolus of 0.9% NaCl given.   06/02/21 2316  Vitals  Temp 100 F (37.8 C)  Temp Source Oral  BP (!) 89/39  MAP (mmHg) (!) 55  BP Location Left Arm  BP Method Automatic  Patient Position (if appropriate) Lying  Pulse Rate 68  Pulse Rate Source Monitor  Resp (!) 24  MEWS COLOR  MEWS Score Color Yellow  Oxygen Therapy  SpO2 99 %  O2 Device Room Air  MEWS Score  MEWS Temp 0  MEWS Systolic 1  MEWS Pulse 0  MEWS RR 1  MEWS LOC 0  MEWS Score 2  Provider Notification  Provider Name/Title Sharion Settler  Date Provider Notified 06/02/21  Time Provider Notified 2330  Notification Type Page  Notification Reason Change in status  Provider response See new orders  Date of Provider Response 06/02/21  Time of Provider Response 2330

## 2021-06-03 NOTE — Progress Notes (Signed)
I received a secure chat from social worker Sarah regarding comfort care measures for this patient.  As per Dr. Judie Bonus orders, comfort care measures are in [facility].  Goals are clear.  DNR in place.    Plan is set for patient to go to inpatient hospice.  Hospice liaison is working on bed availability and placement.    I discussed PMT needs with Dr. Pietro Cassis.  No PMT needs at this time  PMT will continue to be available throughout this patient's hospitalization.  Please reach out to the PMT team for any further needs.  Pierre Part Ilsa Iha, FNP-BC Palliative Medicine Team Team Phone # [phone]   NO CHARGE

## 2021-06-03 NOTE — Progress Notes (Signed)
Received a call from the lab about a critical result. Pt  had Lactic acid of 2.4. On call provider notified and aware.

## 2021-06-03 NOTE — Progress Notes (Signed)
*  PRELIMINARY RESULTS* Echocardiogram 2D Echocardiogram has been performed.  Sherrie Sport 06/03/2021, 11:53 AM

## 2021-06-03 NOTE — Progress Notes (Signed)
[facility]) Hospital Liaison Note   Received request from Transitions of Care Manager Dayton Scrape, LCSW for family interest in [facility]. Visited patient at bedside and spoke with sister [name] to confirm interest and explain services. Patient chart and information reviewed by [facility] physician. Hospice Home eligibility confirmed.    Unfortunately, Hospice Home is not able to offer a room today. Family and [facility] Manager aware hospital liaison will follow up tomorrow or sooner if a room becomes available.    Please do not hesitate to call with any hospice related questions.    Thank you for the opportunity to participate in this patient's care.   Bobbie "Loren Racer, RN, BSN [facility] Liaison [phone]

## 2021-06-03 NOTE — Progress Notes (Signed)
ID Pt is now comfort care Blood culture sent on 11/28 has 2 types of coag neg staph in different bottles- 1 bottle had stpah epi and the other bottle had another kind- so they both are contaminants  ID will sign off

## 2021-06-04 DIAGNOSIS — R451 Restlessness and agitation: Secondary | ICD-10-CM | POA: Diagnosis not present

## 2021-06-04 LAB — CBC WITH DIFFERENTIAL/PLATELET
Abs Immature Granulocytes: 0.15 10*3/uL — ABNORMAL HIGH (ref 0.00–0.07)
Basophils Absolute: 0.1 10*3/uL (ref 0.0–0.1)
Basophils Relative: 1 %
Eosinophils Absolute: 0 10*3/uL (ref 0.0–0.5)
Eosinophils Relative: 0 %
HCT: 28.6 % — ABNORMAL LOW (ref 36.0–46.0)
Hemoglobin: 10 g/dL — ABNORMAL LOW (ref 12.0–15.0)
Immature Granulocytes: 1 %
Lymphocytes Relative: 4 %
Lymphs Abs: 0.6 10*3/uL — ABNORMAL LOW (ref 0.7–4.0)
MCH: 28.9 pg (ref 26.0–34.0)
MCHC: 35 g/dL (ref 30.0–36.0)
MCV: 82.7 fL (ref 80.0–100.0)
Monocytes Absolute: 0.6 10*3/uL (ref 0.1–1.0)
Monocytes Relative: 5 %
Neutro Abs: 12 10*3/uL — ABNORMAL HIGH (ref 1.7–7.7)
Neutrophils Relative %: 89 %
Platelets: 94 10*3/uL — ABNORMAL LOW (ref 150–400)
RBC: 3.46 MIL/uL — ABNORMAL LOW (ref 3.87–5.11)
RDW: 13.9 % (ref 11.5–15.5)
Smear Review: DECREASED
WBC: 13.4 10*3/uL — ABNORMAL HIGH (ref 4.0–10.5)
nRBC: 0 % (ref 0.0–0.2)

## 2021-06-04 LAB — BASIC METABOLIC PANEL
Anion gap: 18 — ABNORMAL HIGH (ref 5–15)
BUN: 94 mg/dL — ABNORMAL HIGH (ref 8–23)
CO2: 22 mmol/L (ref 22–32)
Calcium: 8.4 mg/dL — ABNORMAL LOW (ref 8.9–10.3)
Chloride: 96 mmol/L — ABNORMAL LOW (ref 98–111)
Creatinine, Ser: 6.46 mg/dL — ABNORMAL HIGH (ref 0.44–1.00)
GFR, Estimated: 6 mL/min — ABNORMAL LOW (ref >60)
Glucose, Bld: 148 mg/dL — ABNORMAL HIGH (ref 70–99)
Potassium: 5 mmol/L (ref 3.5–5.1)
Sodium: 136 mmol/L (ref 135–145)

## 2021-06-04 MED ORDER — GLYCOPYRROLATE 0.2 MG/ML IJ SOLN
0.1000 mg | Freq: Four times a day (QID) | INTRAMUSCULAR | Status: DC | PRN
  Administered 2021-06-04 – 2021-06-05 (×2): 0.1 mg via INTRAVENOUS
  Filled 2021-06-04 (×4): qty 0.5

## 2021-06-04 NOTE — Progress Notes (Signed)
Patient received as comfort care.  Patient's breathing even and unlabored, no distress.  Patient's daughter at bedside.  Will monitor and keep comfortable.

## 2021-06-04 NOTE — Progress Notes (Signed)
Patient's daughter and other relatives at bedside.  Daughter asked this RN to take off the oxygen from the patient.  Explained that oxygen is a part of comfort measures. Despite of explanation, daughter insisted to discontinue oxygen. Dr. Pietro Cassis made aware.

## 2021-06-04 NOTE — TOC Progression Note (Signed)
Transition of Care [facility]) - Progression Note    Patient Details  Name: Brianna Dodson MRN: [id number] Date of Birth: 08-12-1933  Transition of Care [facility]) CM/SW [place], LCSW Phone Number: 06/04/2021, 10:23 AM  Clinical Narrative:   CSW called [facility] and spoke to Tammy. Asked for a return call if they have a hospice home bed this weekend.     Expected Discharge Plan: [place] Barriers to Discharge: Equipment Delay [facility] bed was not delived on 11/26 as arranged on 05/27/21. [employer] was contacted twice on 11/26 and it was to be prioritized as patient's needed EMS transport to home.)  Expected Discharge Plan and Services Expected Discharge Plan: [place] Acute Care Choice: Resumption of Svcs/PTA Provider Living arrangements for the past 2 months: Single Family Home                                       Social Determinants of Health (SDOH) Interventions    Readmission Risk Interventions Readmission Risk Prevention Plan 05/18/2021 03/03/2021 03/16/2019  Transportation Screening Complete Complete Complete  PCP or Specialist Appt within 5-7 Days - - Complete  PCP or Specialist Appt within 3-5 Days Complete Complete -  Home Care Screening - - Complete  Medication Review (RN CM) - - Complete  HRI or Home Care Consult Complete Complete -  Social Work Consult for [place] Planning/Counseling Complete Not Complete -  SW consult not completed comments - RNCM assigned to patient -  Palliative Care Screening Not Applicable Not Applicable -  Medication Review (RN Care Manager) Complete Complete -  Some recent data might be hidden

## 2021-06-04 NOTE — Progress Notes (Signed)
[facility]  ROUNDING NOTE   Subjective:   Patient resting comfortably Patient or the family versus no new specific concern. Patient and family have now decided to be comfort care/hospice care   Objective:  Vital signs in last 24 hours:  Temp:  [97.5 F (36.4 C)-98.9 F (37.2 C)] 98.9 F (37.2 C) (12/03 1252) Pulse Rate:  [60-66] 66 (12/03 1252) Resp:  [18-20] 18 (12/03 1252) BP: (84-103)/(45-46) 103/46 (12/03 1252) SpO2:  [93 %-98 %] 93 % (12/03 1252)  Weight change:  Filed Weights   05/18/21 1545 05/27/21 1628  Weight: 60.4 kg 57.4 kg    Intake/Output: I/O last 3 completed shifts: In: 576.9 [IV Piggyback:576.9] Out: 0    Intake/Output this shift:  Total I/O In: 180 [P.O.:180] Out: -   Physical Exam: General: no acute distress, somnolent  Head: Normocephalic, atraumatic. Moist oral mucosal membranes  Lungs:  Rhonchi throughout, cough  Heart: No rub or gallop  Abdomen:  Soft, nontender  Extremities:  No peripheral edema.  Neurologic: Lethargic Left sided weakness.   Skin: No lesions  Access: Right IJ permcath    Basic Metabolic Panel: Recent Labs  Lab 06/01/21 1215 06/02/21 0859 06/03/21 0015 06/03/21 0323 06/04/21 0429  NA 132* 131* 133* 135 136  K 4.0 3.9 4.2 4.3 5.0  CL 97* 95* 98 97* 96*  CO2 23 23 21* 23 22  GLUCOSE 148* 217* 161* 178* 148*  BUN 67* 52* 64* 72* 94*  CREATININE 6.28* 5.05* 5.81* 5.86* 6.46*  CALCIUM 8.3* 8.0* 8.0* 8.3* 8.4*  PHOS  --   --  2.9  --   --     Liver Function Tests: Recent Labs  Lab 06/03/21 0015  ALBUMIN 2.2*   No results for input(s): LIPASE, AMYLASE in the last 168 hours. No results for input(s): AMMONIA in the last 168 hours.   CBC: Recent Labs  Lab 06/01/21 1215 06/02/21 0859 06/03/21 0015 06/03/21 0323 06/04/21 0429  WBC 15.1* 18.0* 18.1* 18.3* 13.4*  NEUTROABS  --  16.3* 15.9* 15.8* 12.0*  HGB 11.0* 10.1* 9.6* 9.3* 10.0*  HCT 31.5* 28.7* 26.7* 26.5* 28.6*  MCV 84.0 84.2 84.8  83.9 82.7  PLT 117* 100* 87* 79* 94*    Cardiac Enzymes: No results for input(s): CKTOTAL, CKMB, CKMBINDEX, TROPONINI in the last 168 hours.  BNP: Invalid input(s): POCBNP  CBG: Recent Labs  Lab 06/02/21 2042 06/02/21 2332 06/03/21 0758 06/03/21 1122 06/03/21 2150  GLUCAP 135* 157* 191* 186* 161*    Microbiology: Results for orders placed or performed during the hospital encounter of 05/16/21  Resp Panel by RT-PCR (Flu A&B, Covid) Nasopharyngeal Swab     Status: None   Collection Time: 05/16/21 10:07 PM   Specimen: Nasopharyngeal Swab; Nasopharyngeal(NP) swabs in vial transport medium  Result Value Ref Range Status   SARS Coronavirus 2 by RT PCR NEGATIVE NEGATIVE Final    Comment: (NOTE) SARS-CoV-2 target nucleic acids are NOT DETECTED.  The SARS-CoV-2 RNA is generally detectable in upper respiratory specimens during the acute phase of infection. The lowest concentration of SARS-CoV-2 viral copies this assay can detect is 138 copies/mL. A negative result does not preclude SARS-Cov-2 infection and should not be used as the sole basis for treatment or other patient management decisions. A negative result may occur with  improper specimen collection/handling, submission of specimen other than nasopharyngeal swab, presence of viral mutation(s) within the areas targeted by this assay, and inadequate number of viral copies(<138 copies/mL). A negative result must  be combined with clinical observations, patient history, and epidemiological information. The expected result is Negative.  Fact Sheet for Patients:  EntrepreneurPulse.com.au  Fact Sheet for Healthcare Providers:  IncredibleEmployment.be  This test is no t yet approved or cleared by the [place] FDA and  has been authorized for detection and/or diagnosis of SARS-CoV-2 by FDA under an Emergency Use Authorization (EUA). This EUA will remain  in effect (meaning this test can  be used) for the duration of the COVID-19 declaration under Section 564(b)(1) of the Act, 21 U.S.C.section 360bbb-3(b)(1), unless the authorization is terminated  or revoked sooner.       Influenza A by PCR NEGATIVE NEGATIVE Final   Influenza B by PCR NEGATIVE NEGATIVE Final    Comment: (NOTE) The Xpert Xpress SARS-CoV-2/FLU/RSV plus assay is intended as an aid in the diagnosis of influenza from Nasopharyngeal swab specimens and should not be used as a sole basis for treatment. Nasal washings and aspirates are unacceptable for Xpert Xpress SARS-CoV-2/FLU/RSV testing.  Fact Sheet for Patients: EntrepreneurPulse.com.au  Fact Sheet for Healthcare Providers: IncredibleEmployment.be  This test is not yet approved or cleared by the [place] FDA and has been authorized for detection and/or diagnosis of SARS-CoV-2 by FDA under an Emergency Use Authorization (EUA). This EUA will remain in effect (meaning this test can be used) for the duration of the COVID-19 declaration under Section 564(b)(1) of the Act, 21 U.S.C. section 360bbb-3(b)(1), unless the authorization is terminated or revoked.  Performed at [facility], [address] [place], [place] [address]   Urine Culture     Status: Abnormal   Collection Time: 05/16/21 10:07 PM   Specimen: In/Out Cath Urine  Result Value Ref Range Status   Specimen Description   Final    IN/OUT CATH URINE Performed at [facility], [address], [place], [place] [address]    Special Requests   Final    NONE Performed at [facility], [address] [place], [place] [address]    Culture (A)  Final    >=100,000 COLONIES/mL GARDNERELLA VAGINALIS Standardized susceptibility testing for this organism is not available. 80,000 COLONIES/mL DIPHTHEROIDS(CORYNEBACTERIUM SPECIES)    Report Status 05/19/2021 FINAL  Final  Resp Panel by RT-PCR (Flu A&B, Covid) Nasopharyngeal  Swab     Status: None   Collection Time: 05/27/21 12:40 PM   Specimen: Nasopharyngeal Swab; Nasopharyngeal(NP) swabs in vial transport medium  Result Value Ref Range Status   SARS Coronavirus 2 by RT PCR NEGATIVE NEGATIVE Final    Comment: (NOTE) SARS-CoV-2 target nucleic acids are NOT DETECTED.  The SARS-CoV-2 RNA is generally detectable in upper respiratory specimens during the acute phase of infection. The lowest concentration of SARS-CoV-2 viral copies this assay can detect is 138 copies/mL. A negative result does not preclude SARS-Cov-2 infection and should not be used as the sole basis for treatment or other patient management decisions. A negative result may occur with  improper specimen collection/handling, submission of specimen other than nasopharyngeal swab, presence of viral mutation(s) within the areas targeted by this assay, and inadequate number of viral copies(<138 copies/mL). A negative result must be combined with clinical observations, patient history, and epidemiological information. The expected result is Negative.  Fact Sheet for Patients:  EntrepreneurPulse.com.au  Fact Sheet for Healthcare Providers:  IncredibleEmployment.be  This test is no t yet approved or cleared by the [place] FDA and  has been authorized for detection and/or diagnosis of SARS-CoV-2 by FDA under an Emergency Use  Authorization (EUA). This EUA will remain  in effect (meaning this test can be used) for the duration of the COVID-19 declaration under Section 564(b)(1) of the Act, 21 U.S.C.section 360bbb-3(b)(1), unless the authorization is terminated  or revoked sooner.       Influenza A by PCR NEGATIVE NEGATIVE Final   Influenza B by PCR NEGATIVE NEGATIVE Final    Comment: (NOTE) The Xpert Xpress SARS-CoV-2/FLU/RSV plus assay is intended as an aid in the diagnosis of influenza from Nasopharyngeal swab specimens and should not be used as a sole  basis for treatment. Nasal washings and aspirates are unacceptable for Xpert Xpress SARS-CoV-2/FLU/RSV testing.  Fact Sheet for Patients: EntrepreneurPulse.com.au  Fact Sheet for Healthcare Providers: IncredibleEmployment.be  This test is not yet approved or cleared by the [place] FDA and has been authorized for detection and/or diagnosis of SARS-CoV-2 by FDA under an Emergency Use Authorization (EUA). This EUA will remain in effect (meaning this test can be used) for the duration of the COVID-19 declaration under Section 564(b)(1) of the Act, 21 U.S.C. section 360bbb-3(b)(1), unless the authorization is terminated or revoked.  Performed at [facility], [address] [place], [place] [address]   CULTURE, BLOOD (ROUTINE X 2) w Reflex to ID Panel     Status: Abnormal   Collection Time: 05/31/21 10:21 AM   Specimen: BLOOD  Result Value Ref Range Status   Specimen Description   Final    BLOOD BLOOD LEFT HAND Performed at [facility], [address] [place], [place] [address]    Special Requests   Final    BOTTLES DRAWN AEROBIC AND ANAEROBIC Blood Culture adequate volume Performed at [facility], [address] [place], [place] [address]    Culture  Setup Time   Final    AEROBIC BOTTLE ONLY GRAM POSITIVE COCCI CRITICAL RESULT CALLED TO, READ BACK BY AND VERIFIED WITH: [facility] MITCHELL 06/01/21 6712 KLW Performed at [facility], [address] [place], [place] [address]    Culture (A)  Final    COAGULASE NEGATIVE STAPHYLOCOCCUS THE SIGNIFICANCE OF ISOLATING THIS ORGANISM FROM A SINGLE SET OF BLOOD CULTURES WHEN MULTIPLE SETS ARE DRAWN IS UNCERTAIN. PLEASE NOTIFY THE MICROBIOLOGY DEPARTMENT WITHIN ONE WEEK IF SPECIATION AND SENSITIVITIES ARE REQUIRED. Performed at [facility], [place] [address], [place], [place] [address]    Report Status 06/04/2021 FINAL  Final  CULTURE, BLOOD (ROUTINE X  2) w Reflex to ID Panel     Status: Abnormal   Collection Time: 05/31/21 11:34 AM   Specimen: BLOOD  Result Value Ref Range Status   Specimen Description   Final    BLOOD BLOOD LEFT HAND Performed at [facility], [address] [place], [place] [address]    Special Requests   Final    BOTTLES DRAWN AEROBIC AND ANAEROBIC Blood Culture adequate volume Performed at [facility], [address], [place], [place] [address]    Culture  Setup Time   Final    GRAM POSITIVE COCCI AEROBIC BOTTLE ONLY CRITICAL RESULT CALLED TO, READ BACK BY AND VERIFIED WITH: DEVON MITCHELL 06/01/21 0928 KLW    Culture (A)  Final    STAPHYLOCOCCUS EPIDERMIDIS THE SIGNIFICANCE OF ISOLATING THIS ORGANISM FROM A SINGLE SET OF BLOOD CULTURES WHEN MULTIPLE SETS ARE DRAWN IS UNCERTAIN. PLEASE NOTIFY THE MICROBIOLOGY DEPARTMENT WITHIN ONE WEEK IF SPECIATION AND SENSITIVITIES ARE REQUIRED. Performed at [facility], [place] [address] [place], [place] [address]  Report Status 06/03/2021 FINAL  Final  Blood Culture ID Panel (Reflexed)     Status: Abnormal   Collection Time: 05/31/21 11:34 AM  Result Value Ref Range Status   Enterococcus faecalis NOT DETECTED NOT DETECTED Final   Enterococcus Faecium NOT DETECTED NOT DETECTED Final   Listeria monocytogenes NOT DETECTED NOT DETECTED Final   Staphylococcus species DETECTED (A) NOT DETECTED Final    Comment: CRITICAL RESULT CALLED TO, READ BACK BY AND VERIFIED WITH: DEVAN MITCHELL 06/01/21 0928 KLW    Staphylococcus aureus (BCID) NOT DETECTED NOT DETECTED Final   Staphylococcus epidermidis DETECTED (A) NOT DETECTED Final    Comment: CRITICAL RESULT CALLED TO, READ BACK BY AND VERIFIED WITH: DEVAN MITCHELL 06/01/21 0928 KLW    Staphylococcus lugdunensis NOT DETECTED NOT DETECTED Final   Streptococcus species NOT DETECTED NOT DETECTED Final   Streptococcus agalactiae NOT DETECTED NOT DETECTED Final   Streptococcus pneumoniae NOT DETECTED NOT  DETECTED Final   Streptococcus pyogenes NOT DETECTED NOT DETECTED Final   A.calcoaceticus-baumannii NOT DETECTED NOT DETECTED Final   Bacteroides fragilis NOT DETECTED NOT DETECTED Final   Enterobacterales NOT DETECTED NOT DETECTED Final   Enterobacter cloacae complex NOT DETECTED NOT DETECTED Final   Escherichia coli NOT DETECTED NOT DETECTED Final   Klebsiella aerogenes NOT DETECTED NOT DETECTED Final   Klebsiella oxytoca NOT DETECTED NOT DETECTED Final   Klebsiella pneumoniae NOT DETECTED NOT DETECTED Final   Proteus species NOT DETECTED NOT DETECTED Final   Salmonella species NOT DETECTED NOT DETECTED Final   Serratia marcescens NOT DETECTED NOT DETECTED Final   Haemophilus influenzae NOT DETECTED NOT DETECTED Final   Neisseria meningitidis NOT DETECTED NOT DETECTED Final   Pseudomonas aeruginosa NOT DETECTED NOT DETECTED Final   Stenotrophomonas maltophilia NOT DETECTED NOT DETECTED Final   Candida albicans NOT DETECTED NOT DETECTED Final   Candida auris NOT DETECTED NOT DETECTED Final   Candida glabrata NOT DETECTED NOT DETECTED Final   Candida krusei NOT DETECTED NOT DETECTED Final   Candida parapsilosis NOT DETECTED NOT DETECTED Final   Candida tropicalis NOT DETECTED NOT DETECTED Final   Cryptococcus neoformans/gattii NOT DETECTED NOT DETECTED Final   Methicillin resistance mecA/C NOT DETECTED NOT DETECTED Final    Comment: Performed at [facility], [address] [place], [place] [address]  CULTURE, BLOOD (ROUTINE X 2) w Reflex to ID Panel     Status: None (Preliminary result)   Collection Time: 06/01/21 12:15 PM   Specimen: BLOOD  Result Value Ref Range Status   Specimen Description BLOOD BLOOD LEFT HAND  Final   Special Requests   Final    BOTTLES DRAWN AEROBIC AND ANAEROBIC Blood Culture adequate volume   Culture   Final    NO GROWTH 3 DAYS Performed at [facility], [address] [place], [place] [address]    Report Status PENDING  Incomplete   CULTURE, BLOOD (ROUTINE X 2) w Reflex to ID Panel     Status: None (Preliminary result)   Collection Time: 06/01/21  1:16 PM   Specimen: BLOOD  Result Value Ref Range Status   Specimen Description BLOOD BLOOD LEFT HAND  Final   Special Requests   Final    BOTTLES DRAWN AEROBIC AND ANAEROBIC Blood Culture results may not be optimal due to an inadequate volume of blood received in culture bottles   Culture   Final    NO GROWTH 3 DAYS Performed at [facility], [address], [place], [place] [address]  Report Status PENDING  Incomplete  Aerobic Culture w Gram Stain (superficial specimen)     Status: None (Preliminary result)   Collection Time: 06/01/21  6:08 PM   Specimen: Wound  Result Value Ref Range Status   Specimen Description   Final    WOUND Performed at [facility], [address], [place], [place] [address]    Special Requests   Final    NONE Performed at [facility], [address] [place], [place] [address]    Gram Stain   Final    NO SQUAMOUS EPITHELIAL CELLS SEEN FEW WBC SEEN MODERATE GRAM POSITIVE RODS MODERATE GRAM POSITIVE COCCI    Culture   Final    FEW PROTEUS MIRABILIS CULTURE REINCUBATED FOR BETTER GROWTH Performed at [facility], [place] [address], [place], [place] [address]    Report Status PENDING  Incomplete   Organism ID, Bacteria PROTEUS MIRABILIS  Final      Susceptibility   Proteus mirabilis - MIC*    AMPICILLIN <=2 SENSITIVE Sensitive     CEFAZOLIN <=4 SENSITIVE Sensitive     CEFEPIME <=0.12 SENSITIVE Sensitive     CEFTAZIDIME <=1 SENSITIVE Sensitive     CEFTRIAXONE <=0.25 SENSITIVE Sensitive     CIPROFLOXACIN <=0.25 SENSITIVE Sensitive     GENTAMICIN <=1 SENSITIVE Sensitive     IMIPENEM 2 SENSITIVE Sensitive     TRIMETH/SULFA <=20 SENSITIVE Sensitive     AMPICILLIN/SULBACTAM <=2 SENSITIVE Sensitive     PIP/TAZO <=4 SENSITIVE Sensitive     * FEW PROTEUS MIRABILIS    Coagulation Studies: No  results for input(s): LABPROT, INR in the last 72 hours.  Urinalysis: No results for input(s): COLORURINE, LABSPEC, PHURINE, GLUCOSEU, HGBUR, BILIRUBINUR, KETONESUR, PROTEINUR, UROBILINOGEN, NITRITE, LEUKOCYTESUR in the last 72 hours.  Invalid input(s): APPERANCEUR     Imaging: DG Chest Port 1 View  Result Date: 06/03/2021 CLINICAL DATA:  Fever. EXAM: PORTABLE CHEST 1 VIEW COMPARISON:  May 31, 2021 FINDINGS: There is stable right-sided venous catheter positioning. Stable areas of linear scarring and/or atelectasis are seen within the mid right lung and left lung base with stable elevation of the right hemidiaphragm. There is no evidence of a pleural effusion or pneumothorax. The heart size and mediastinal contours are within normal limits. A radiopaque loop recorder device is present. Moderate severity calcification of the aortic arch is noted. Multilevel degenerative changes seen throughout the thoracic spine. IMPRESSION: Stable areas of linear scarring and/or atelectasis within the mid right lung and left lung base. Electronically Signed   By: Virgina Norfolk M.D.   On: 06/03/2021 00:49   ECHOCARDIOGRAM COMPLETE  Result Date: 06/03/2021    ECHOCARDIOGRAM REPORT   Patient Name:   Brianna Dodson Date of Exam: 06/03/2021 Medical Rec #:  [id number]     Height:       65.0 in Accession #:    [id number]    Weight:       126.5 lb Date of Birth:  [birth year]      BSA:          1.628 m Patient Age:    87 years      BP:           81/50 mmHg Patient Gender: F             HR:           96 bpm. Exam Location:  [facility] Procedure: 2D Echo, Color Doppler and Cardiac Doppler Indications:  Bacteremia R78.81  History:         Patient has prior history of Echocardiogram examinations, most                  recent 03/18/2021. Arrythmias:Atrial Fibrillation; Risk                  Factors:Hypertension, Diabetes, Sleep Apnea and Dyslipidemia.  Sonographer:     Sherrie Sport Referring Phys:  [id number] Tsosie Billing  Diagnosing Phys: Ida Rogue MD  Sonographer Comments: Suboptimal apical window. IMPRESSIONS  1. No valve vegetation noted  2. Left ventricular ejection fraction, by estimation, is 60 to 65%. The left ventricle has normal function. The left ventricle has no regional wall motion abnormalities. Left ventricular diastolic parameters are indeterminate.  3. Right ventricular systolic function is normal. The right ventricular size is normal.  4. The mitral valve is normal in structure. Mild mitral valve regurgitation. No evidence of mitral stenosis.  5. The aortic valve was not well visualized. Aortic valve regurgitation is not visualized. Aortic valve sclerosis is present, with no evidence of aortic valve stenosis.  6. The inferior vena cava is normal in size with greater than 50% respiratory variability, suggesting right atrial pressure of 3 mmHg. FINDINGS  Left Ventricle: Left ventricular ejection fraction, by estimation, is 60 to 65%. The left ventricle has normal function. The left ventricle has no regional wall motion abnormalities. The left ventricular internal cavity size was normal in size. There is  no left ventricular hypertrophy. Left ventricular diastolic parameters are indeterminate. Right Ventricle: The right ventricular size is normal. No increase in right ventricular wall thickness. Right ventricular systolic function is normal. Left Atrium: Left atrial size was normal in size. Right Atrium: Right atrial size was normal in size. Pericardium: There is no evidence of pericardial effusion. Mitral Valve: The mitral valve is normal in structure. Mild mitral valve regurgitation. No evidence of mitral valve stenosis. Tricuspid Valve: The tricuspid valve is normal in structure. Tricuspid valve regurgitation is mild . No evidence of tricuspid stenosis. Aortic Valve: The aortic valve was not well visualized. Aortic valve regurgitation is not visualized. Aortic valve sclerosis is present, with no evidence of  aortic valve stenosis. Aortic valve mean gradient measures 3.0 mmHg. Aortic valve peak gradient measures 4.2 mmHg. Aortic valve area, by VTI measures 3.13 cm. Pulmonic Valve: The pulmonic valve was normal in structure. Pulmonic valve regurgitation is not visualized. No evidence of pulmonic stenosis. Aorta: The aortic root is normal in size and structure. Venous: The inferior vena cava is normal in size with greater than 50% respiratory variability, suggesting right atrial pressure of 3 mmHg. IAS/Shunts: No atrial level shunt detected by color flow Doppler.  LEFT VENTRICLE PLAX 2D LVIDd:         4.11 cm LVIDs:         2.84 cm LV PW:         0.97 cm LV IVS:        1.02 cm LVOT diam:     2.30 cm LV SV:         64 LV SV Index:   39 LVOT Area:     4.15 cm  RIGHT VENTRICLE RV Basal diam:  3.15 cm RV S prime:     10.70 cm/s TAPSE (M-mode): 3.0 cm LEFT ATRIUM           Index LA diam:      3.70 cm 2.27 cm/m LA Vol (A2C): 26.6 ml 16.33 ml/m LA  Vol (A4C): 40.0 ml 24.56 ml/m  AORTIC VALVE                    PULMONIC VALVE AV Area (Vmax):    3.00 cm     PV Vmax:        0.62 m/s AV Area (Vmean):   2.84 cm     PV Vmean:       43.900 cm/s AV Area (VTI):     3.13 cm     PV VTI:         0.098 m AV Vmax:           103.00 cm/s  PV Peak grad:   1.5 mmHg AV Vmean:          72.900 cm/s  PV Mean grad:   1.0 mmHg AV VTI:            0.203 m      RVOT Peak grad: 1 mmHg AV Peak Grad:      4.2 mmHg AV Mean Grad:      3.0 mmHg LVOT Vmax:         74.40 cm/s LVOT Vmean:        49.900 cm/s LVOT VTI:          0.153 m LVOT/AV VTI ratio: 0.75  AORTA Ao Root diam: 3.27 cm MITRAL VALVE                TRICUSPID VALVE MV Area (PHT): 3.93 cm     TR Peak grad:   11.0 mmHg MV Decel Time: 193 msec     TR Vmax:        166.00 cm/s MV E velocity: 102.00 cm/s                             SHUNTS                             Systemic VTI:  0.15 m                             Systemic Diam: 2.30 cm                             Pulmonic VTI:  0.075 m Ida Rogue MD Electronically signed by Ida Rogue MD Signature Date/Time: 06/03/2021/3:09:24 PM    Final      Medications:       Chlorhexidine Gluconate Cloth  6 each Topical Daily   Chlorhexidine Gluconate Cloth  6 each Topical Q0600   collagenase   Topical Daily   feeding supplement  237 mL Oral TID BM   mouth rinse  15 mL Mouth Rinse BID   mirtazapine  7.5 mg Oral QHS   risperiDONE  0.5 mg Oral QHS   scopolamine  1 patch Transdermal Q72H   acetaminophen **OR** acetaminophen, glycopyrrolate, haloperidol lactate, LORazepam **OR** LORazepam **OR** LORazepam, morphine injection, ondansetron **OR** ondansetron (ZOFRAN) IV, polyethylene glycol, risperiDONE  Assessment/ Plan:  Ms. Kushi Kun is a 87 y.o. black female with end stage renal disease on hemodialysis, hypertension, CVA, vascular dementia, hyperlipidemia, diabetes mellitus type II, atrial fibrillation who is admitted to [facility] on 05/16/2021 for Confusion [R41.0] Agitation [R45.1]   [facility] MWF [facility] [address] RIJ permcath 65.5kg   End Stage  Renal Disease requiring hemodialysis: Patient is now under hospice/comfort care No aggressive treatment going forward No need for dialysis  Patient is now under hospice care We will sign off Please call if any questions   Altered mental status: Patient with history of chronic right MCA stroke and chronic atrophy.  Abnormal EEG. Levetiracetam at neurology recommendations of 500mg  twice daily with additional 250 mg dose after dialysis.       hypotension Patient all antihypertensive medications are now held Patient is under comfort care    Anemia of chronic kidney disease:  Lab Results  Component Value Date   HGB 10.0 (L) 06/04/2021    Hemoglobin within acceptable ramge  Secondary Hyperparathyroidism:  Lab Results  Component Value Date   PTH 83 (H) 01/06/2016   CALCIUM 8.4 (L) 06/04/2021   CAION 1.04 (L) 02/24/2021   PHOS 2.9 06/03/2021    6.  Social Patient is now  under comfort care     LOS: Ashley 12/3/20221:33 PM

## 2021-06-04 NOTE — Progress Notes (Signed)
PROGRESS NOTE  Brianna Dodson  DOB: [birth year]  PCP: Brianna Haven, MD BOF:[id number]  DOA: 05/16/2021  LOS: 78 days  Hospital Day: 64  Chief Complaint  Patient presents with   Altered Mental Status    EMS AMS    Brief narrative: Brianna Dodson is a 87 y.o. female with PMH significant for Alzheimer's dementia, multiple strokes in the past, A. fib on chronic anticoagulation, ESRD HD MWF, DM2, HTN, HLD, OSA, carotid stenosis, chronic anemia, chronic hepatitis C,  Patient was brought to the ED from home on 11/14 for agitation, altered mental status.  In the ED, patient was significantly agitated, restless and required multiple sedating agents.   CT head showed chronic right MCA infarct, chronic atrophic and ischemic changes. Admitted to hospitalist service.  During this hospitalization, patient's mental status has waxed and waned.  She was set to go to SNF on 05/27/2021 but patient's sister refused and requested the patient come home with home health.  Hospital bed and other equipment were delivered to pt's home as per request of the sister.  During this process, patient had fever spikes and hence discharge was held.   Blood culture grew staph epidermidis. Over the course of next few days, patient continued to decline with his spikes of fever, low blood pressure, poor mental status. On 12/2, I discussed with patient's POA/sister [name] at bedside.  We had extensive discussion about patient's poor clinical status, poor prognosis, risk of prolonged suffering and futility of any aggressive measures.  [name] and other family members agreed and wanted to pursue comfort care measures. CODE STATUS updated to DNR/DNI status.  Subjective: Patient was seen and examined this morning.   Elderly African-American female.   Comfortable.  No restlessness or agitation.  Granddaughter at bedside.   Patient is on low-flow oxygen at this time.  There is no role of oxygen therapy at this  time.  I discussed with the granddaughter about discontinuing oxygen.  She wanted to talk to other family members.  Later this morning, they made a decision to stop oxygen.  Assessment/Plan: Comfort care status -Elderly female ESRD on HD who has been physically and mentally declining for the last several weeks and was not able to comply to outpatient dialysis.  She was admitted for agitation and altered mental status. -Clinically worsened.  After extensive discussion with family, patient is now comfort care -Regular medicines withheld.  Ordered for morphine, Ativan, scopolamine patch -CODE STATUS updated to DNR/DNI. -Social worker consulted for hospice placement. -Can stop oxygen as stated above. -Robinul IV added for secretions.  Medical issues addressed in the hospital as below Acute delirium on dementia with behavioral disturbances Staph epidermidis bacteremia ESRD HD  Essential hypertension Type 2 diabetes mellitus Hx of CVA Diarrhea  Goals of care:   Code Status: DNR  Nutritional status: Body mass index is 21.06 kg/m. Nutrition Problem: Moderate Malnutrition Etiology: chronic illness (ESRD on HD, dementia, CVA) Signs/Symptoms: mild fat depletion, moderate fat depletion, moderate muscle depletion Diet:  Diet Order             DIET DYS 3 Room service appropriate? No; Fluid consistency: Thin  Diet effective now                  DVT prophylaxis:     Family Communication: Granddaughter at bedside  Dispo: The patient is from: Home              Anticipated d/c is to: Poor prognosis.  Likely to suffer in-hospital mortality.  Residential hospice if prolonged              Patient currently is not medically stable to d/c.   Difficult to place patient No     Infusions:     Scheduled Meds:  Chlorhexidine Gluconate Cloth  6 each Topical Daily   Chlorhexidine Gluconate Cloth  6 each Topical Q0600   collagenase   Topical Daily   feeding supplement  237 mL Oral TID BM    mouth rinse  15 mL Mouth Rinse BID   mirtazapine  7.5 mg Oral QHS   risperiDONE  0.5 mg Oral QHS   scopolamine  1 patch Transdermal Q72H    PRN meds: acetaminophen **OR** acetaminophen, glycopyrrolate, haloperidol lactate, LORazepam **OR** LORazepam **OR** LORazepam, morphine injection, ondansetron **OR** ondansetron (ZOFRAN) IV, polyethylene glycol, risperiDONE   Antimicrobials: Anti-infectives (From admission, onward)    Start     Dose/Rate Route Frequency Ordered Stop   06/02/21 2200  metroNIDAZOLE (FLAGYL) IVPB 500 mg  Status:  Discontinued        500 mg 100 mL/hr over 60 Minutes Intravenous Every 12 hours 06/02/21 2057 06/03/21 1139   06/02/21 1800  ceFAZolin (ANCEF) IVPB 1 g/50 mL premix  Status:  Discontinued        1 g 100 mL/hr over 30 Minutes Intravenous Daily-1800 06/01/21 1018 06/03/21 1139   06/01/21 1200  ceFAZolin (ANCEF) IVPB 2g/100 mL premix        2 g 200 mL/hr over 30 Minutes Intravenous  Once 06/01/21 1018 06/01/21 1330       Objective: Vitals:   06/03/21 1120 06/03/21 2000  BP: (!) 79/48 (!) 84/45  Pulse: 96 60  Resp: 18 20  Temp: (!) 97.3 F (36.3 C) (!) 97.5 F (36.4 C)  SpO2: 100% 98%    Intake/Output Summary (Last 24 hours) at 06/04/2021 1147 Last data filed at 06/04/2021 1008 Gross per 24 hour  Intake 180 ml  Output 0 ml  Net 180 ml    Filed Weights   05/18/21 1545 05/27/21 1628  Weight: 60.4 kg 57.4 kg   Weight change:  Body mass index is 21.06 kg/m.   Physical Exam: General exam: Elderly African-American female.  Seems comfortable.  I did not do a detailed examination because of comfort care status.  Data Review: I have personally reviewed the laboratory data and studies available.   Signed, Terrilee Croak, MD [facility] 06/04/2021

## 2021-06-05 DIAGNOSIS — R451 Restlessness and agitation: Secondary | ICD-10-CM | POA: Diagnosis not present

## 2021-06-05 LAB — AEROBIC CULTURE W GRAM STAIN (SUPERFICIAL SPECIMEN): Gram Stain: NONE SEEN

## 2021-06-05 LAB — CULTURE, BLOOD (ROUTINE X 2): Special Requests: ADEQUATE

## 2021-06-05 MED ORDER — LORAZEPAM 2 MG/ML IJ SOLN
1.0000 mg | INTRAMUSCULAR | 0 refills | Status: AC | PRN
Start: 2021-06-05 — End: ?

## 2021-06-05 MED ORDER — LORAZEPAM 1 MG PO TABS: 1.0000 mg | ORAL_TABLET | ORAL | 0 refills | Status: AC | PRN

## 2021-06-05 MED ORDER — HALOPERIDOL LACTATE 5 MG/ML IJ SOLN: 1.0000 mg | Freq: Every day | INTRAMUSCULAR | Status: AC | PRN

## 2021-06-05 MED ORDER — GLYCOPYRROLATE 0.2 MG/ML IJ SOLN: 0.1000 mg | Freq: Four times a day (QID) | INTRAMUSCULAR | Status: AC | PRN

## 2021-06-05 MED ORDER — MORPHINE SULFATE (PF) 2 MG/ML IV SOLN: 1.0000 mg | INTRAVENOUS | 0 refills | Status: AC | PRN

## 2021-06-05 MED ORDER — RISPERIDONE 0.5 MG PO TBDP: 0.5000 mg | ORAL_TABLET | Freq: Four times a day (QID) | ORAL | Status: AC | PRN

## 2021-06-05 MED ORDER — SCOPOLAMINE 1 MG/3DAYS TD PT72: 1.0000 | MEDICATED_PATCH | TRANSDERMAL | 12 refills | Status: AC

## 2021-06-05 MED ORDER — RISPERIDONE 0.5 MG PO TBDP: 0.5000 mg | ORAL_TABLET | Freq: Every day | ORAL | Status: AC

## 2021-06-05 MED ORDER — POLYETHYLENE GLYCOL 3350 17 G PO PACK: 17.0000 g | PACK | Freq: Every day | ORAL | 0 refills | Status: AC | PRN

## 2021-06-05 MED ORDER — ACETAMINOPHEN 650 MG RE SUPP: 650.0000 mg | Freq: Four times a day (QID) | RECTAL | 0 refills | Status: AC | PRN

## 2021-06-05 MED ORDER — LORAZEPAM 2 MG/ML PO CONC
1.0000 mg | ORAL | 0 refills | Status: AC | PRN
Start: 2021-06-05 — End: ?

## 2021-06-05 NOTE — Discharge Summary (Signed)
Physician Discharge Summary  Brianna Dodson TKW:[id number] DOB: 07-31-1933 DOA: 05/16/2021  PCP: [name], MD  Admit date: 05/16/2021 Discharge date: 06/05/2021  Admitted From: Home Discharge disposition: Residential hospice   Code Status: DNR   Discharge Diagnosis:   Principal Problem:   Agitation Active Problems:   Essential hypertension   Type II diabetes mellitus with renal manifestations ([place])   Atrial fibrillation (HCC)   Mixed Alzheimer's and vascular dementia ([place])   End stage renal disease on dialysis ([place])   Recurrent strokes (HCC)   Thrombocytopenia ([place])   Chronic anticoagulation   Pressure injury of skin   Hypertensive urgency   Post-ictal state [facility])    Chief Complaint  Patient presents with   Altered Mental Status    EMS AMS    Brief narrative: Brianna Dodson is a 87 y.o. female with PMH significant for Alzheimer's dementia, multiple strokes in the past, A. fib on chronic anticoagulation, ESRD HD MWF, DM2, HTN, HLD, OSA, carotid stenosis, chronic anemia, chronic hepatitis C,  Patient was brought to the ED from home on 11/14 for agitation, altered mental status.  In the ED, patient was significantly agitated, restless and required multiple sedating agents.   CT head showed chronic right MCA infarct, chronic atrophic and ischemic changes. Admitted to hospitalist service.  During this hospitalization, patient's mental status has waxed and waned.  She was set to go to SNF on 05/27/2021 but patient's sister refused and requested the patient come home with home health.  Hospital bed and other equipment were delivered to pt's home as per request of the sister.  During this process, patient had fever spikes and hence discharge was held.   Blood culture grew staph epidermidis. Over the course of next few days, patient continued to decline with his spikes of fever, low blood pressure, poor mental status. On 12/2, I discussed with patient's POA/sister Ms.  Brianna Dodson at bedside.  We had extensive discussion about patient's poor clinical status, poor prognosis, risk of prolonged suffering and futility of any aggressive measures.  [name] and other family members agreed and wanted to pursue comfort care measures. CODE STATUS updated to DNR/DNI status.  Subjective: Patient was seen and examined this morning.   Elderly African-American female.   Comfortable.  No restlessness or agitation.  Patient's sister [name] is at bedside. Agreeable to discharge to hospice facility today.  Assessment/Plan: Comfort care status -Elderly female ESRD on HD who has been physically and mentally declining for the last several weeks and was not able to comply to outpatient dialysis.  She was admitted for agitation and altered mental status. -Clinically worsened.  After extensive discussion with family, patient is now comfort care -Regular medicines withheld.  Ordered for morphine, Ativan, scopolamine patch -CODE STATUS updated to DNR/DNI. -Social worker consulted for hospice placement. -Robinul IV for secretions.  Medical issues addressed in the hospital as below Acute delirium on dementia with behavioral disturbances Staph epidermidis bacteremia ESRD HD  Essential hypertension Type 2 diabetes mellitus Hx of CVA Diarrhea  Goals of care:   Code Status: DNR  Nutritional status: Body mass index is 21.06 kg/m. Nutrition Problem: Moderate Malnutrition Etiology: chronic illness (ESRD on HD, dementia, CVA) Signs/Symptoms: mild fat depletion, moderate fat depletion, moderate muscle depletion Diet:  Diet Order             Diet general           DIET DYS 3 Room service appropriate? No; Fluid consistency: Thin  Diet  effective now                   Discharge Medications:   Allergies as of 06/05/2021       Reactions   Nsaids    CKD stage III - Avoid all nephrotoxic drugs        Medication List     STOP taking these medications     Accu-Chek Guide test strip Generic drug: glucose blood   acetaminophen 325 MG tablet Commonly known as: TYLENOL Replaced by: acetaminophen 650 MG suppository   amLODipine 5 MG tablet Commonly known as: NORVASC   ascorbic acid 500 MG tablet Commonly known as: VITAMIN C   atorvastatin 80 MG tablet Commonly known as: LIPITOR   carvedilol 6.25 MG tablet Commonly known as: COREG   Eliquis 2.5 MG Tabs tablet Generic drug: apixaban   ezetimibe 10 MG tablet Commonly known as: ZETIA   feeding supplement (NEPRO CARB STEADY) Liqd   hydrALAZINE 50 MG tablet Commonly known as: APRESOLINE   Iron 28 MG Tabs   linagliptin 5 MG Tabs tablet Commonly known as: Tradjenta   liver oil-zinc oxide 40 % ointment Commonly known as: DESITIN   losartan 100 MG tablet Commonly known as: COZAAR   mirtazapine 7.5 MG tablet Commonly known as: REMERON   multivitamin Tabs tablet   onetouch ultrasoft lancets   pregabalin 25 MG capsule Commonly known as: Lyrica   senna-docusate 8.6-50 MG tablet Commonly known as: Senokot-S   Vitamin D (Ergocalciferol) 1.25 MG (50000 UNIT) Caps capsule Commonly known as: DRISDOL       TAKE these medications    acetaminophen 650 MG suppository Commonly known as: TYLENOL Place 1 suppository (650 mg total) rectally every 6 (six) hours as needed for mild pain (or Fever >/= 101). Replaces: acetaminophen 325 MG tablet   glycopyrrolate 0.2 MG/ML injection Commonly known as: ROBINUL Inject 0.5 mLs (0.1 mg total) into the vein every 6 (six) hours as needed (secretions).   haloperidol lactate 5 MG/ML injection Commonly known as: HALDOL Inject 0.2 mLs (1 mg total) into the muscle daily as needed.   LORazepam 1 MG tablet Commonly known as: ATIVAN Take 1 tablet (1 mg total) by mouth every 4 (four) hours as needed for anxiety.   LORazepam 2 MG/ML concentrated solution Commonly known as: ATIVAN Place 0.5 mLs (1 mg total) under the tongue every 4  (four) hours as needed for anxiety.   LORazepam 2 MG/ML injection Commonly known as: ATIVAN Inject 0.5 mLs (1 mg total) into the vein every 4 (four) hours as needed for anxiety.   morphine 2 MG/ML injection Inject 0.5 mLs (1 mg total) into the vein every 2 (two) hours as needed (or dyspnea).   polyethylene glycol 17 g packet Commonly known as: MIRALAX / GLYCOLAX Take 17 g by mouth daily as needed for moderate constipation.   risperiDONE 0.5 MG disintegrating tablet Commonly known as: RISPERDAL M-TABS Take 1 tablet (0.5 mg total) by mouth at bedtime.   risperiDONE 0.5 MG disintegrating tablet Commonly known as: RISPERDAL M-TABS Take 1 tablet (0.5 mg total) by mouth every 6 (six) hours as needed (Agitation).   scopolamine 1 MG/3DAYS Commonly known as: TRANSDERM-SCOP Place 1 patch (1.5 mg total) onto the skin every 3 (three) days. Start taking on: June 06, 2021               Durable Medical Equipment  (From admission, onward)           Start  Ordered   05/27/21 1500  For home use only DME 3 n 1  Once        05/27/21 1459   05/27/21 1459  For home use only DME Hospital bed  Once       Question Answer Comment  Length of Need Lifetime   Patient has (list medical condition): ESRD on HD, generalized weakness & difficulty ambulating, dementia   Bed type Semi-electric      05/27/21 1459            Wound care:   Pressure Injury 05/24/21 Coccyx Mid Unstageable - Full thickness tissue loss in which the base of the injury is covered by slough (yellow, tan, gray, green or brown) and/or eschar (tan, brown or black) in the wound bed. (Active)  Date First Assessed/Time First Assessed: 05/24/21 0630   Location: Coccyx  Location Orientation: Mid  Staging: Unstageable - Full thickness tissue loss in which the base of the injury is covered by slough (yellow, tan, gray, green or brown) and/or esc...    Assessments 05/23/2021  8:35 PM 06/04/2021  7:48 AM  Dressing Type  Foam - Lift dressing to assess site every shift Foam - Lift dressing to assess site every shift  Dressing Clean;Dry;Intact Intact  Dressing Change Frequency PRN --  Site / Wound Assessment Painful;Pale --  Wound Length (cm) 3 cm --  Wound Width (cm) 2 cm --  Wound Depth (cm) 0 cm --  Wound Surface Area (cm^2) 6 cm^2 --  Wound Volume (cm^3) 0 cm^3 --  Drainage Amount Moderate None  Drainage Description Odor --     No Linked orders to display    Discharge Instructions:   Discharge Instructions     Diet general   Complete by: As directed    [place] feeding if she wakes up   No wound care   Complete by: As directed        Follow ups:    Follow-up Information     [name], MD Follow up.   Specialty: Family Medicine Contact information: [address] [place] [address] [phone]                 Discharge Exam:   Vitals:   06/03/21 2000 06/04/21 1252 06/04/21 2021 06/05/21 1026  BP: (!) 84/45 (!) 103/46 (!) 104/49 (!) 103/44  Pulse: 60 66 66 71  Resp: 20 18 (!) 24 (!) 22  Temp: (!) 97.5 F (36.4 C) 98.9 F (37.2 C) 99.8 F (37.7 C) (!) 100.4 F (38 C)  TempSrc: Axillary Axillary Oral Oral  SpO2: 98% 93% 96% 94%  Weight:      Height:        Body mass index is 21.06 kg/m.  General exam: Comfortable.  Did not do detailed examination because of comfort care status  Time coordinating discharge: 25 minutes   The results of significant diagnostics from this hospitalization (including imaging, microbiology, ancillary and laboratory) are listed below for reference.    Procedures and Diagnostic Studies:   DG Chest Port 1 View  Result Date: 06/03/2021 CLINICAL DATA:  Fever. EXAM: PORTABLE CHEST 1 VIEW COMPARISON:  May 31, 2021 FINDINGS: There is stable right-sided venous catheter positioning. Stable areas of linear scarring and/or atelectasis are seen within the mid right lung and left lung base with stable elevation of the  right hemidiaphragm. There is no evidence of a pleural effusion or pneumothorax. The heart size and mediastinal contours are within normal  limits. A radiopaque loop recorder device is present. Moderate severity calcification of the aortic arch is noted. Multilevel degenerative changes seen throughout the thoracic spine. IMPRESSION: Stable areas of linear scarring and/or atelectasis within the mid right lung and left lung base. Electronically Signed   By: Virgina Norfolk M.D.   On: 06/03/2021 00:49   ECHOCARDIOGRAM COMPLETE  Result Date: 06/03/2021    ECHOCARDIOGRAM REPORT   Patient Name:   Brianna Dodson Date of Exam: 06/03/2021 Medical Rec #:  [id number]     Height:       65.0 in Accession #:    [id number]    Weight:       126.5 lb Date of Birth:  30-Aug-1933      BSA:          1.628 m Patient Age:    68 years      BP:           81/50 mmHg Patient Gender: F             HR:           96 bpm. Exam Location:  [facility] Procedure: 2D Echo, Color Doppler and Cardiac Doppler Indications:     Bacteremia R78.81  History:         Patient has prior history of Echocardiogram examinations, most                  recent 03/18/2021. Arrythmias:Atrial Fibrillation; Risk                  Factors:Hypertension, Diabetes, Sleep Apnea and Dyslipidemia.  Sonographer:     Sherrie Sport Referring Phys:  [id number] Tsosie Billing Diagnosing Phys: Ida Rogue MD  Sonographer Comments: Suboptimal apical window. IMPRESSIONS  1. No valve vegetation noted  2. Left ventricular ejection fraction, by estimation, is 60 to 65%. The left ventricle has normal function. The left ventricle has no regional wall motion abnormalities. Left ventricular diastolic parameters are indeterminate.  3. Right ventricular systolic function is normal. The right ventricular size is normal.  4. The mitral valve is normal in structure. Mild mitral valve regurgitation. No evidence of mitral stenosis.  5. The aortic valve was not well visualized. Aortic valve regurgitation  is not visualized. Aortic valve sclerosis is present, with no evidence of aortic valve stenosis.  6. The inferior vena cava is normal in size with greater than 50% respiratory variability, suggesting right atrial pressure of 3 mmHg. FINDINGS  Left Ventricle: Left ventricular ejection fraction, by estimation, is 60 to 65%. The left ventricle has normal function. The left ventricle has no regional wall motion abnormalities. The left ventricular internal cavity size was normal in size. There is  no left ventricular hypertrophy. Left ventricular diastolic parameters are indeterminate. Right Ventricle: The right ventricular size is normal. No increase in right ventricular wall thickness. Right ventricular systolic function is normal. Left Atrium: Left atrial size was normal in size. Right Atrium: Right atrial size was normal in size. Pericardium: There is no evidence of pericardial effusion. Mitral Valve: The mitral valve is normal in structure. Mild mitral valve regurgitation. No evidence of mitral valve stenosis. Tricuspid Valve: The tricuspid valve is normal in structure. Tricuspid valve regurgitation is mild . No evidence of tricuspid stenosis. Aortic Valve: The aortic valve was not well visualized. Aortic valve regurgitation is not visualized. Aortic valve sclerosis is present, with no evidence of aortic valve stenosis. Aortic valve mean gradient measures 3.0 mmHg. Aortic valve peak gradient measures 4.2  mmHg. Aortic valve area, by VTI measures 3.13 cm. Pulmonic Valve: The pulmonic valve was normal in structure. Pulmonic valve regurgitation is not visualized. No evidence of pulmonic stenosis. Aorta: The aortic root is normal in size and structure. Venous: The inferior vena cava is normal in size with greater than 50% respiratory variability, suggesting right atrial pressure of 3 mmHg. IAS/Shunts: No atrial level shunt detected by color flow Doppler.  LEFT VENTRICLE PLAX 2D LVIDd:         4.11 cm LVIDs:         2.84  cm LV PW:         0.97 cm LV IVS:        1.02 cm LVOT diam:     2.30 cm LV SV:         64 LV SV Index:   39 LVOT Area:     4.15 cm  RIGHT VENTRICLE RV Basal diam:  3.15 cm RV S prime:     10.70 cm/s TAPSE (M-mode): 3.0 cm LEFT ATRIUM           Index LA diam:      3.70 cm 2.27 cm/m LA Vol (A2C): 26.6 ml 16.33 ml/m LA Vol (A4C): 40.0 ml 24.56 ml/m  AORTIC VALVE                    PULMONIC VALVE AV Area (Vmax):    3.00 cm     PV Vmax:        0.62 m/s AV Area (Vmean):   2.84 cm     PV Vmean:       43.900 cm/s AV Area (VTI):     3.13 cm     PV VTI:         0.098 m AV Vmax:           103.00 cm/s  PV Peak grad:   1.5 mmHg AV Vmean:          72.900 cm/s  PV Mean grad:   1.0 mmHg AV VTI:            0.203 m      RVOT Peak grad: 1 mmHg AV Peak Grad:      4.2 mmHg AV Mean Grad:      3.0 mmHg LVOT Vmax:         74.40 cm/s LVOT Vmean:        49.900 cm/s LVOT VTI:          0.153 m LVOT/AV VTI ratio: 0.75  AORTA Ao Root diam: 3.27 cm MITRAL VALVE                TRICUSPID VALVE MV Area (PHT): 3.93 cm     TR Peak grad:   11.0 mmHg MV Decel Time: 193 msec     TR Vmax:        166.00 cm/s MV E velocity: 102.00 cm/s                             SHUNTS                             Systemic VTI:  0.15 m                             Systemic Diam: 2.30 cm  Pulmonic VTI:  0.075 m Ida Rogue MD Electronically signed by Ida Rogue MD Signature Date/Time: 06/03/2021/3:09:24 PM    Final      Labs:   Basic Metabolic Panel: Recent Labs  Lab 06/01/21 1215 06/02/21 0859 06/03/21 0015 06/03/21 0323 06/04/21 0429  NA 132* 131* 133* 135 136  K 4.0 3.9 4.2 4.3 5.0  CL 97* 95* 98 97* 96*  CO2 23 23 21* 23 22  GLUCOSE 148* 217* 161* 178* 148*  BUN 67* 52* 64* 72* 94*  CREATININE 6.28* 5.05* 5.81* 5.86* 6.46*  CALCIUM 8.3* 8.0* 8.0* 8.3* 8.4*  PHOS  --   --  2.9  --   --    GFR Estimated Creatinine Clearance: 5.7 mL/min (A) (by C-G formula based on SCr of 6.46 mg/dL (H)). Liver Function  Tests: Recent Labs  Lab 06/03/21 0015  ALBUMIN 2.2*   No results for input(s): LIPASE, AMYLASE in the last 168 hours. No results for input(s): AMMONIA in the last 168 hours. Coagulation profile No results for input(s): INR, PROTIME in the last 168 hours.  CBC: Recent Labs  Lab 06/01/21 1215 06/02/21 0859 06/03/21 0015 06/03/21 0323 06/04/21 0429  WBC 15.1* 18.0* 18.1* 18.3* 13.4*  NEUTROABS  --  16.3* 15.9* 15.8* 12.0*  HGB 11.0* 10.1* 9.6* 9.3* 10.0*  HCT 31.5* 28.7* 26.7* 26.5* 28.6*  MCV 84.0 84.2 84.8 83.9 82.7  PLT 117* 100* 87* 79* 94*   Cardiac Enzymes: No results for input(s): CKTOTAL, CKMB, CKMBINDEX, TROPONINI in the last 168 hours. BNP: Invalid input(s): POCBNP CBG: Recent Labs  Lab 06/02/21 2042 06/02/21 2332 06/03/21 0758 06/03/21 1122 06/03/21 2150  GLUCAP 135* 157* 191* 186* 161*   D-Dimer No results for input(s): DDIMER in the last 72 hours. Hgb A1c No results for input(s): HGBA1C in the last 72 hours. Lipid Profile No results for input(s): CHOL, HDL, LDLCALC, TRIG, CHOLHDL, LDLDIRECT in the last 72 hours. Thyroid function studies No results for input(s): TSH, T4TOTAL, T3FREE, THYROIDAB in the last 72 hours.  Invalid input(s): FREET3 Anemia work up No results for input(s): VITAMINB12, FOLATE, FERRITIN, TIBC, IRON, RETICCTPCT in the last 72 hours. Microbiology Recent Results (from the past 240 hour(s))  Resp Panel by RT-PCR (Flu A&B, Covid) Nasopharyngeal Swab     Status: None   Collection Time: 05/27/21 12:40 PM   Specimen: Nasopharyngeal Swab; Nasopharyngeal(NP) swabs in vial transport medium  Result Value Ref Range Status   SARS Coronavirus 2 by RT PCR NEGATIVE NEGATIVE Final    Comment: (NOTE) SARS-CoV-2 target nucleic acids are NOT DETECTED.  The SARS-CoV-2 RNA is generally detectable in upper respiratory specimens during the acute phase of infection. The lowest concentration of SARS-CoV-2 viral copies this assay can detect is 138  copies/mL. A negative result does not preclude SARS-Cov-2 infection and should not be used as the sole basis for treatment or other patient management decisions. A negative result may occur with  improper specimen collection/handling, submission of specimen other than nasopharyngeal swab, presence of viral mutation(s) within the areas targeted by this assay, and inadequate number of viral copies(<138 copies/mL). A negative result must be combined with clinical observations, patient history, and epidemiological information. The expected result is Negative.  Fact Sheet for Patients:  EntrepreneurPulse.com.au  Fact Sheet for Healthcare Providers:  IncredibleEmployment.be  This test is no t yet approved or cleared by the [place] FDA and  has been authorized for detection and/or diagnosis of SARS-CoV-2 by FDA under an Emergency Use Authorization (EUA). This  EUA will remain  in effect (meaning this test can be used) for the duration of the COVID-19 declaration under Section 564(b)(1) of the Act, 21 U.S.C.section 360bbb-3(b)(1), unless the authorization is terminated  or revoked sooner.       Influenza A by PCR NEGATIVE NEGATIVE Final   Influenza B by PCR NEGATIVE NEGATIVE Final    Comment: (NOTE) The Xpert Xpress SARS-CoV-2/FLU/RSV plus assay is intended as an aid in the diagnosis of influenza from Nasopharyngeal swab specimens and should not be used as a sole basis for treatment. Nasal washings and aspirates are unacceptable for Xpert Xpress SARS-CoV-2/FLU/RSV testing.  Fact Sheet for Patients: EntrepreneurPulse.com.au  Fact Sheet for Healthcare Providers: IncredibleEmployment.be  This test is not yet approved or cleared by the [place] FDA and has been authorized for detection and/or diagnosis of SARS-CoV-2 by FDA under an Emergency Use Authorization (EUA). This EUA will remain in effect (meaning  this test can be used) for the duration of the COVID-19 declaration under Section 564(b)(1) of the Act, 21 U.S.C. section 360bbb-3(b)(1), unless the authorization is terminated or revoked.  Performed at [facility], [address] [place], [place] [address]   CULTURE, BLOOD (ROUTINE X 2) w Reflex to ID Panel     Status: Abnormal   Collection Time: 05/31/21 10:21 AM   Specimen: BLOOD  Result Value Ref Range Status   Specimen Description   Final    BLOOD BLOOD LEFT HAND Performed at [facility], [place], [address] [place], [place] [address]    Special Requests   Final    BOTTLES DRAWN AEROBIC AND ANAEROBIC Blood Culture adequate volume Performed at [facility], [address] [place], [place] [address]    Culture  Setup Time   Final    AEROBIC BOTTLE ONLY GRAM POSITIVE COCCI CRITICAL RESULT CALLED TO, READ BACK BY AND VERIFIED WITH: [facility] MITCHELL 06/01/21 5277 KLW Performed at [facility], [address] [place], [place] [address]    Culture (A)  Final    COAGULASE NEGATIVE STAPHYLOCOCCUS NOT STAPHYLOCOCCUS EPIDERMIDIS THE SIGNIFICANCE OF ISOLATING THIS ORGANISM FROM A SINGLE SET OF BLOOD CULTURES WHEN MULTIPLE SETS ARE DRAWN IS UNCERTAIN. PLEASE NOTIFY THE MICROBIOLOGY DEPARTMENT WITHIN ONE WEEK IF SPECIATION AND SENSITIVITIES ARE REQUIRED. Performed at [facility], [place] [address], [place], [place] [address]    Report Status 06/05/2021 FINAL  Final  CULTURE, BLOOD (ROUTINE X 2) w Reflex to ID Panel     Status: Abnormal   Collection Time: 05/31/21 11:34 AM   Specimen: BLOOD  Result Value Ref Range Status   Specimen Description   Final    BLOOD BLOOD LEFT HAND Performed at [facility], [address], [place], [place] [address]    Special Requests   Final    BOTTLES DRAWN AEROBIC AND ANAEROBIC Blood Culture adequate volume Performed at [facility], [address], [place], [place] [address]    Culture   Setup Time   Final    GRAM POSITIVE COCCI AEROBIC BOTTLE ONLY CRITICAL RESULT CALLED TO, READ BACK BY AND VERIFIED WITH: [place]ON MITCHELL 06/01/21 0928 KLW    Culture (A)  Final    STAPHYLOCOCCUS EPIDERMIDIS THE SIGNIFICANCE OF ISOLATING THIS ORGANISM FROM A SINGLE SET OF BLOOD CULTURES WHEN MULTIPLE SETS ARE DRAWN IS UNCERTAIN. PLEASE NOTIFY THE MICROBIOLOGY DEPARTMENT WITHIN ONE WEEK IF SPECIATION AND SENSITIVITIES ARE REQUIRED. Performed at [facility], [place] [address], [place], [place] [address]  Report Status 06/03/2021 FINAL  Final  Blood Culture ID Panel (Reflexed)     Status: Abnormal   Collection Time: 05/31/21 11:34 AM  Result Value Ref Range Status   Enterococcus faecalis NOT DETECTED NOT DETECTED Final   Enterococcus Faecium NOT DETECTED NOT DETECTED Final   Listeria monocytogenes NOT DETECTED NOT DETECTED Final   Staphylococcus species DETECTED (A) NOT DETECTED Final    Comment: CRITICAL RESULT CALLED TO, READ BACK BY AND VERIFIED WITH: DEVAN MITCHELL 06/01/21 0928 KLW    Staphylococcus aureus (BCID) NOT DETECTED NOT DETECTED Final   Staphylococcus epidermidis DETECTED (A) NOT DETECTED Final    Comment: CRITICAL RESULT CALLED TO, READ BACK BY AND VERIFIED WITH: DEVAN MITCHELL 06/01/21 0928 KLW    Staphylococcus lugdunensis NOT DETECTED NOT DETECTED Final   Streptococcus species NOT DETECTED NOT DETECTED Final   Streptococcus agalactiae NOT DETECTED NOT DETECTED Final   Streptococcus pneumoniae NOT DETECTED NOT DETECTED Final   Streptococcus pyogenes NOT DETECTED NOT DETECTED Final   A.calcoaceticus-baumannii NOT DETECTED NOT DETECTED Final   Bacteroides fragilis NOT DETECTED NOT DETECTED Final   Enterobacterales NOT DETECTED NOT DETECTED Final   Enterobacter cloacae complex NOT DETECTED NOT DETECTED Final   Escherichia coli NOT DETECTED NOT DETECTED Final   Klebsiella aerogenes NOT DETECTED NOT DETECTED Final   Klebsiella oxytoca NOT DETECTED NOT DETECTED Final    Klebsiella pneumoniae NOT DETECTED NOT DETECTED Final   Proteus species NOT DETECTED NOT DETECTED Final   Salmonella species NOT DETECTED NOT DETECTED Final   Serratia marcescens NOT DETECTED NOT DETECTED Final   Haemophilus influenzae NOT DETECTED NOT DETECTED Final   Neisseria meningitidis NOT DETECTED NOT DETECTED Final   Pseudomonas aeruginosa NOT DETECTED NOT DETECTED Final   Stenotrophomonas maltophilia NOT DETECTED NOT DETECTED Final   Candida albicans NOT DETECTED NOT DETECTED Final   Candida auris NOT DETECTED NOT DETECTED Final   Candida glabrata NOT DETECTED NOT DETECTED Final   Candida krusei NOT DETECTED NOT DETECTED Final   Candida parapsilosis NOT DETECTED NOT DETECTED Final   Candida tropicalis NOT DETECTED NOT DETECTED Final   Cryptococcus neoformans/gattii NOT DETECTED NOT DETECTED Final   Methicillin resistance mecA/C NOT DETECTED NOT DETECTED Final    Comment: Performed at [facility], [address] [place], [place] [address]  CULTURE, BLOOD (ROUTINE X 2) w Reflex to ID Panel     Status: None (Preliminary result)   Collection Time: 06/01/21 12:15 PM   Specimen: BLOOD  Result Value Ref Range Status   Specimen Description BLOOD BLOOD LEFT HAND  Final   Special Requests   Final    BOTTLES DRAWN AEROBIC AND ANAEROBIC Blood Culture adequate volume   Culture   Final    NO GROWTH 4 DAYS Performed at [facility] [address] [place], [place] [address]    Report Status PENDING  Incomplete  CULTURE, BLOOD (ROUTINE X 2) w Reflex to ID Panel     Status: None (Preliminary result)   Collection Time: 06/01/21  1:16 PM   Specimen: BLOOD  Result Value Ref Range Status   Specimen Description BLOOD BLOOD LEFT HAND  Final   Special Requests   Final    BOTTLES DRAWN AEROBIC AND ANAEROBIC Blood Culture results may not be optimal due to an inadequate volume of blood received in culture bottles   Culture   Final    NO GROWTH 4 DAYS Performed at  [facility], [address] [place], [place] [address]  Report Status PENDING  Incomplete  Aerobic Culture w Gram Stain (superficial specimen)     Status: None (Preliminary result)   Collection Time: 06/01/21  6:08 PM   Specimen: Wound  Result Value Ref Range Status   Specimen Description   Final    WOUND Performed at [facility], [address] [place], [place] [address]    Special Requests   Final    NONE Performed at [facility], [address] [place], [place] [address]    Gram Stain   Final    NO SQUAMOUS EPITHELIAL CELLS SEEN FEW WBC SEEN MODERATE GRAM POSITIVE RODS MODERATE GRAM POSITIVE COCCI    Culture   Final    FEW PROTEUS MIRABILIS MODERATE GRAM NEGATIVE RODS IDENTIFICATION AND SUSCEPTIBILITIES TO FOLLOW CULTURE REINCUBATED FOR BETTER GROWTH Performed at [facility], [place] [address], [place], [place] [address]    Report Status PENDING  Incomplete   Organism ID, Bacteria PROTEUS MIRABILIS  Final      Susceptibility   Proteus mirabilis - MIC*    AMPICILLIN <=2 SENSITIVE Sensitive     CEFAZOLIN <=4 SENSITIVE Sensitive     CEFEPIME <=0.12 SENSITIVE Sensitive     CEFTAZIDIME <=1 SENSITIVE Sensitive     CEFTRIAXONE <=0.25 SENSITIVE Sensitive     CIPROFLOXACIN <=0.25 SENSITIVE Sensitive     GENTAMICIN <=1 SENSITIVE Sensitive     IMIPENEM 2 SENSITIVE Sensitive     TRIMETH/SULFA <=20 SENSITIVE Sensitive     AMPICILLIN/SULBACTAM <=2 SENSITIVE Sensitive     PIP/TAZO <=4 SENSITIVE Sensitive     * FEW PROTEUS MIRABILIS     Signed: Yasmene Salomone  [facility] 06/05/2021, 11:13 AM

## 2021-06-05 NOTE — TOC Transition Note (Addendum)
Transition of Care [facility]) - CM/SW Discharge Note   Patient Details  Name: Brianna Dodson MRN: [id number] Date of Birth: [birth year]  Transition of Care [facility] CM/SW Contact:  Magnus Ivan, LCSW Phone Number: 06/05/2021, 11:29 AM   Clinical Narrative:   Patient to DC to hospice home today. Updated RN and MD. Spoke with [facility] who stated she will update the family and they can take patient at 1 pm. EMS paperwork and DNR placed in DC packet.   [facility] transport arranged for 1pm pick up (or after pending truck availability). Asked Jasmine with [employer] to follow up with patient's family about picking hospital bed up from the home.    Final next level of care: [place] Barriers to Discharge: Barriers Resolved   Patient Goals and CMS Choice     Choice offered to / list presented to : NA  Discharge Placement                Patient to be transferred to facility by: [facility]   Patient and family notified of of transfer: 06/05/21  Discharge Plan and Services     Post Acute Care Choice: Resumption of Svcs/PTA Provider                               Social Determinants of Health (SDOH) Interventions     Readmission Risk Interventions Readmission Risk Prevention Plan 05/18/2021 03/03/2021 03/16/2019  Transportation Screening Complete Complete Complete  PCP or Specialist Appt within 5-7 Days - - Complete  PCP or Specialist Appt within 3-5 Days Complete Complete -  Home Care Screening - - Complete  Medication Review (RN CM) - - Complete  HRI or Home Care Consult Complete Complete -  Social Work Consult for [place] Planning/Counseling Complete Not Complete -  SW consult not completed comments - RNCM assigned to patient -  Palliative Care Screening Not Applicable Not Applicable -  Medication Review (RN Care Manager) Complete Complete -  Some recent data might be hidden

## 2021-06-05 NOTE — Progress Notes (Signed)
AC called to bedside for assistance in pulling right chest HD catheter.  HD catheter unable to pull out due to resistance, patient in pain.  Dr. Pietro Cassis made aware.  Per MD, may keep catheter in place.  Hospice Home RN Otila Kluver updated.  Transport in the unit.  Patient discharged to [facility].

## 2021-06-06 LAB — CULTURE, BLOOD (ROUTINE X 2)
Culture: NO GROWTH
Special Requests: ADEQUATE

## 2021-06-07 ENCOUNTER — Encounter (INDEPENDENT_AMBULATORY_CARE_PROVIDER_SITE_OTHER): Payer: Self-pay

## 2021-06-07 ENCOUNTER — Ambulatory Visit (INDEPENDENT_AMBULATORY_CARE_PROVIDER_SITE_OTHER): Payer: Medicare Other | Admitting: Vascular Surgery

## 2021-06-07 ENCOUNTER — Encounter (INDEPENDENT_AMBULATORY_CARE_PROVIDER_SITE_OTHER): Payer: Medicare Other

## 2021-06-09 ENCOUNTER — Other Ambulatory Visit: Payer: Self-pay | Admitting: Family Medicine

## 2021-06-23 ENCOUNTER — Telehealth: Payer: Medicare Other

## 2021-07-03 DEATH — deceased

## 2021-07-12 ENCOUNTER — Ambulatory Visit (INDEPENDENT_AMBULATORY_CARE_PROVIDER_SITE_OTHER): Payer: Medicare Other | Admitting: Nurse Practitioner

## 2021-08-09 ENCOUNTER — Ambulatory Visit: Payer: Medicare Other | Admitting: Family Medicine

## 2022-09-02 IMAGING — US US THYROID
1 series · 13 of 25 positions shown · non-contrast
Comparison: 09/04/2019

CLINICAL DATA: Thyroid nodules

EXAM:
THYROID ULTRASOUND
TECHNIQUE: Ultrasound examination of the thyroid gland and adjacent soft
tissues was performed.

[Series 1: us thyroid · 0.07mm/px · 13 of 44 slices shown]
[im 1/44]
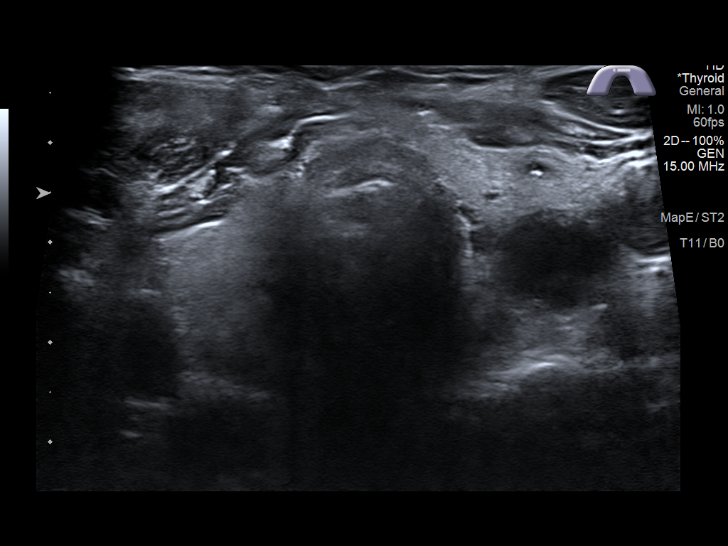
[im 4/44]
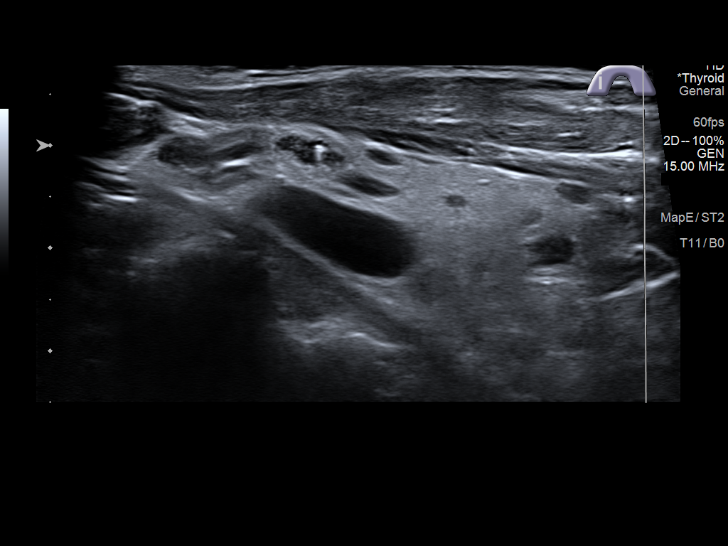
[im 8/44]
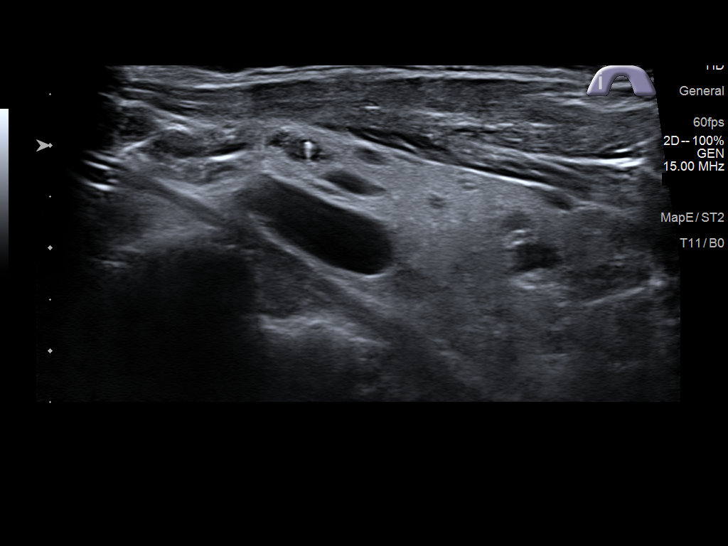
[im 11/44]
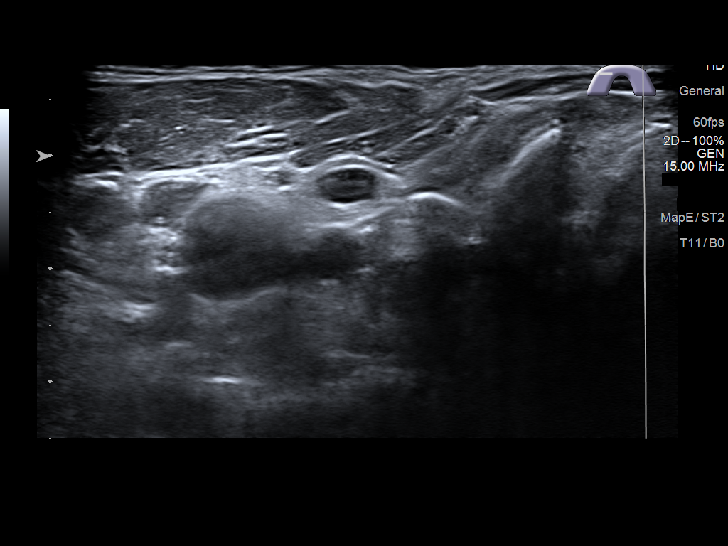
[im 15/44]
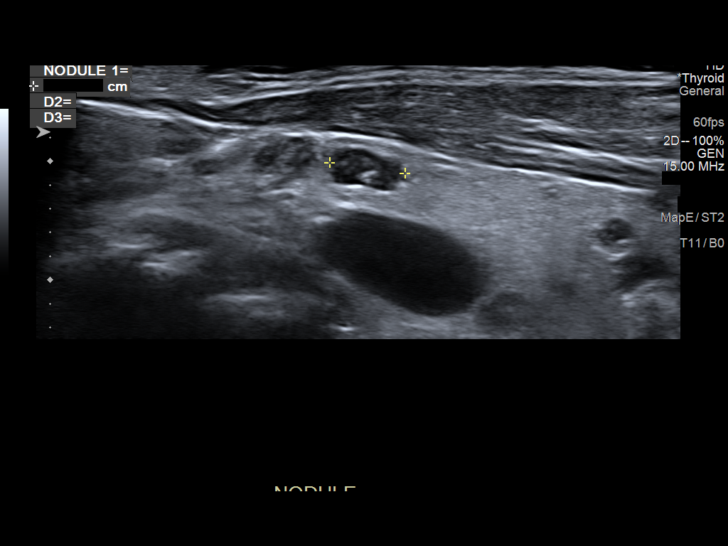
[im 18/44]
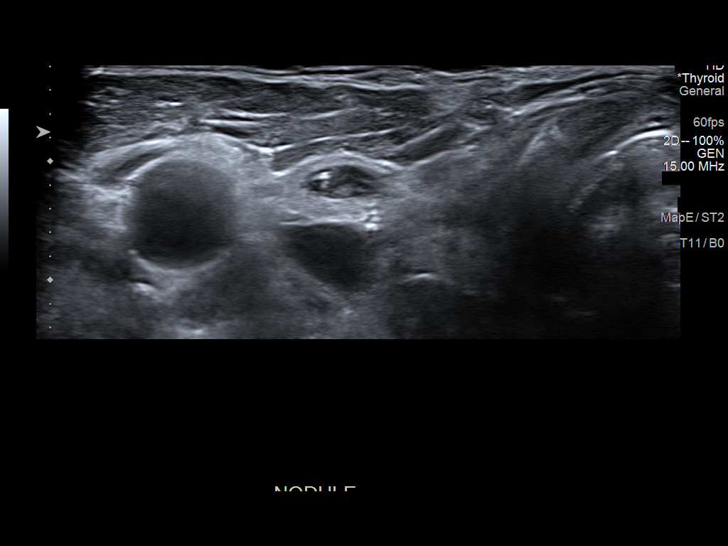
[im 22/44]
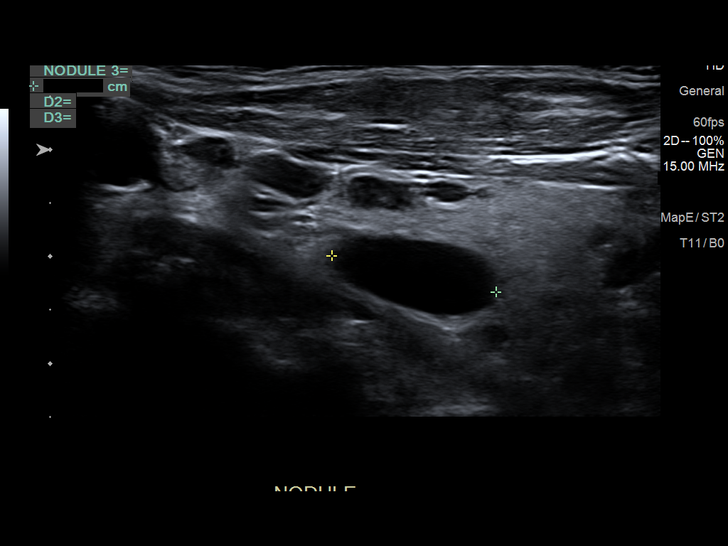
[im 26/44]
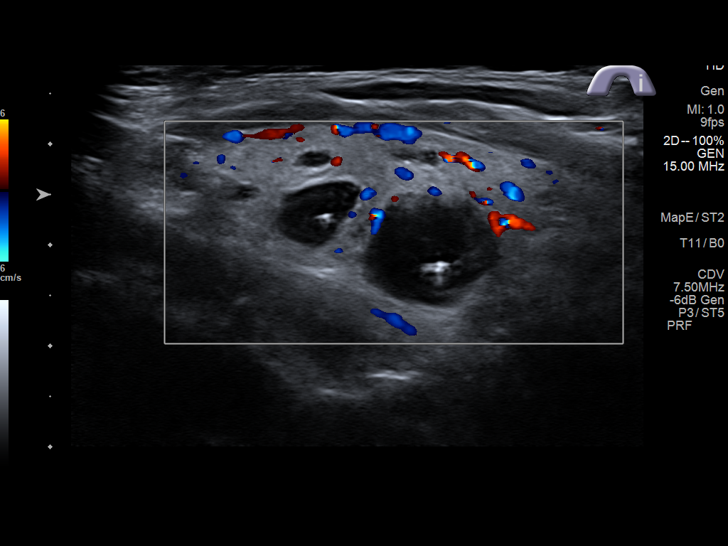
[im 29/44]
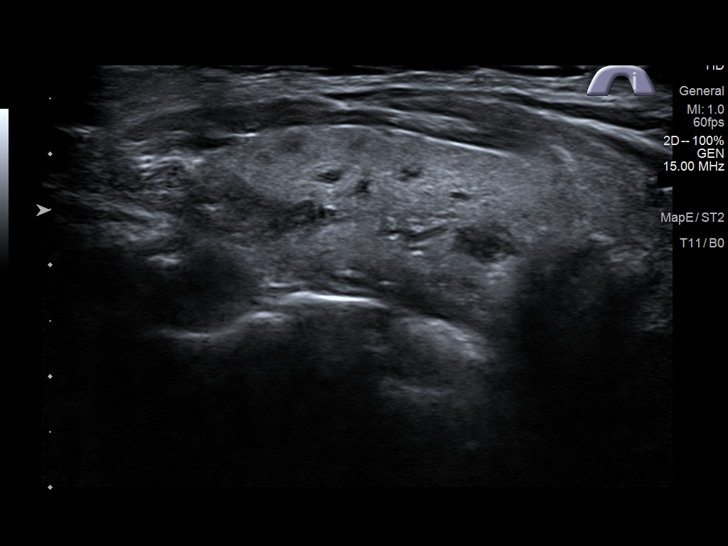
[im 33/44]
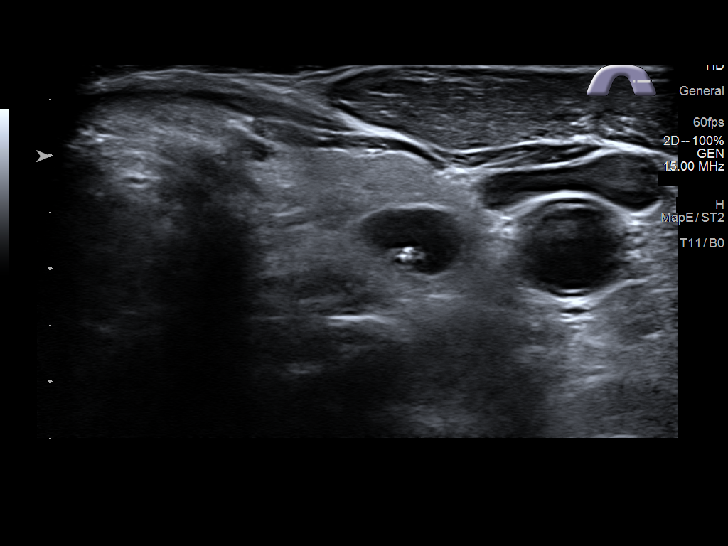
[im 36/44]
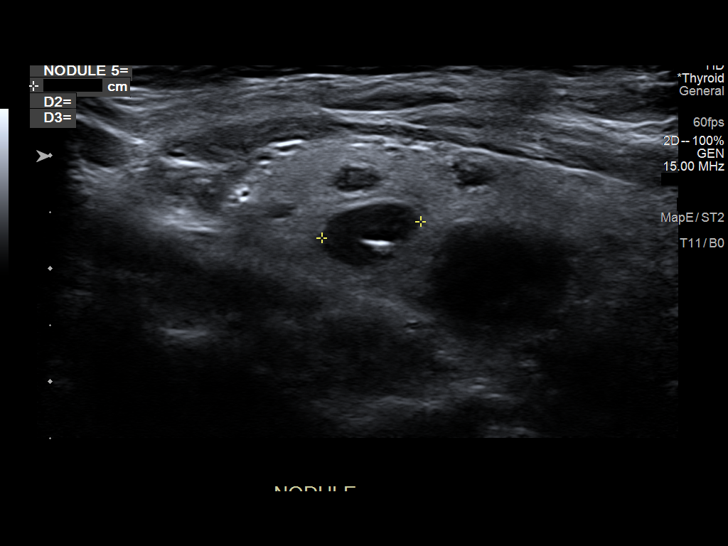
[im 40/44]
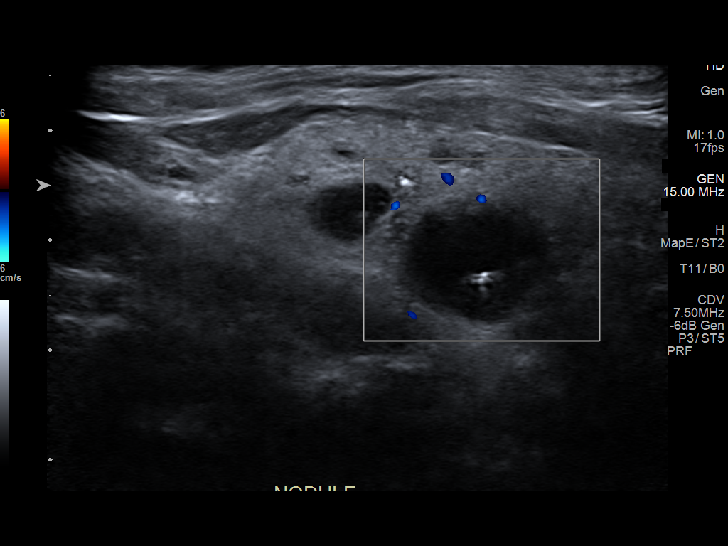
[im 44/44]
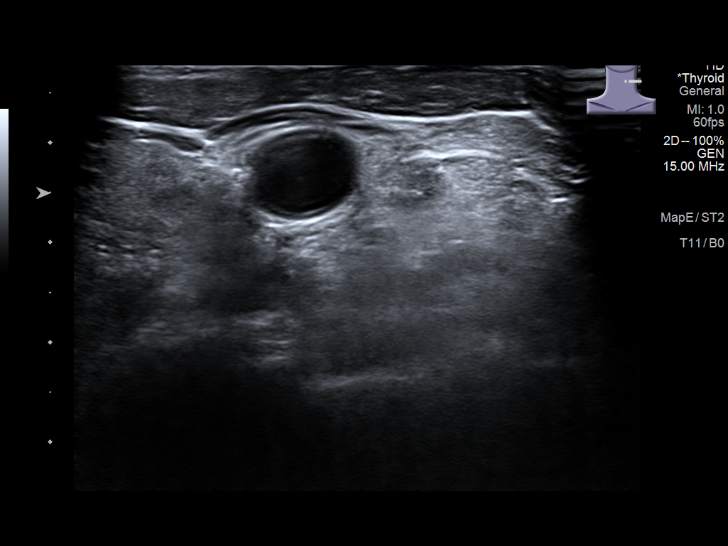

[13 of 25 positions shown; findings below may reference images not displayed]

FINDINGS: Parenchymal Echotexture: Mildly heterogenous

Isthmus: 3 mm

Right lobe: 3.4 x 1.6 x 1.7 cm

Left lobe: 4.0 x 1.8 x 1.7 cm

_________________________________________________________

Estimated total number of nodules >/= 1 cm: 2

Number of spongiform nodules >/=  2 cm not described below (TR1): 0

Number of mixed cystic and solid nodules >/= 1.5 cm not described
below (TR2):

_________________________________________________________

Nodule # 5:

Location: Left; Mid

Maximum size: 0.9, previously 1.2 cm; Other 2 dimensions: 0.9 x
cm

Composition: mixed cystic and solid (1)

Echogenicity: isoechoic (1)

Shape: not taller-than-wide (0)

Margins: smooth (0)

Echogenic foci: macrocalcifications (1)

ACR TI-RADS total points: 3.

ACR TI-RADS risk category: TR3 (3 points).

ACR TI-RADS recommendations:

This nodule does NOT meet TI-RADS criteria for biopsy or dedicated
follow-up. Compared to the prior study, this nodule has been down
graded from TR 4 to TR 3. Nodule is more cystic.

There are similar additional scattered predominantly cystic nodules
bilaterally ranging in size from 6 mm to 1.6 cm. These all have a
benign appearance and are not fully described by TI rads criteria.

No new solid thyroid nodule. No hypervascularity. No regional
adenopathy.

_________________________________________________________
IMPRESSION: Stable mildly heterogeneous thyroid gland with cystic nodules noted
throughout.

Previous left mid thyroid TR 4 nodule is smaller with interval
cystic degeneration and is therefore down graded to a TR 3 nodule.
This no longer meets criteria for any additional follow-up.

The above is in keeping with the ACR TI-RADS recommendations - [HOSPITAL] 5301;[DATE].

## 2022-10-06 IMAGING — DX DG CHEST 1V PORT
2 series · 2 of 2 positions shown · non-contrast
Comparison: Chest x-rays dated 05/16/2021 and 03/16/2021.

CLINICAL DATA: Altered mental status

EXAM:
PORTABLE CHEST 1 VIEW

[chest ap (1 of 2)]
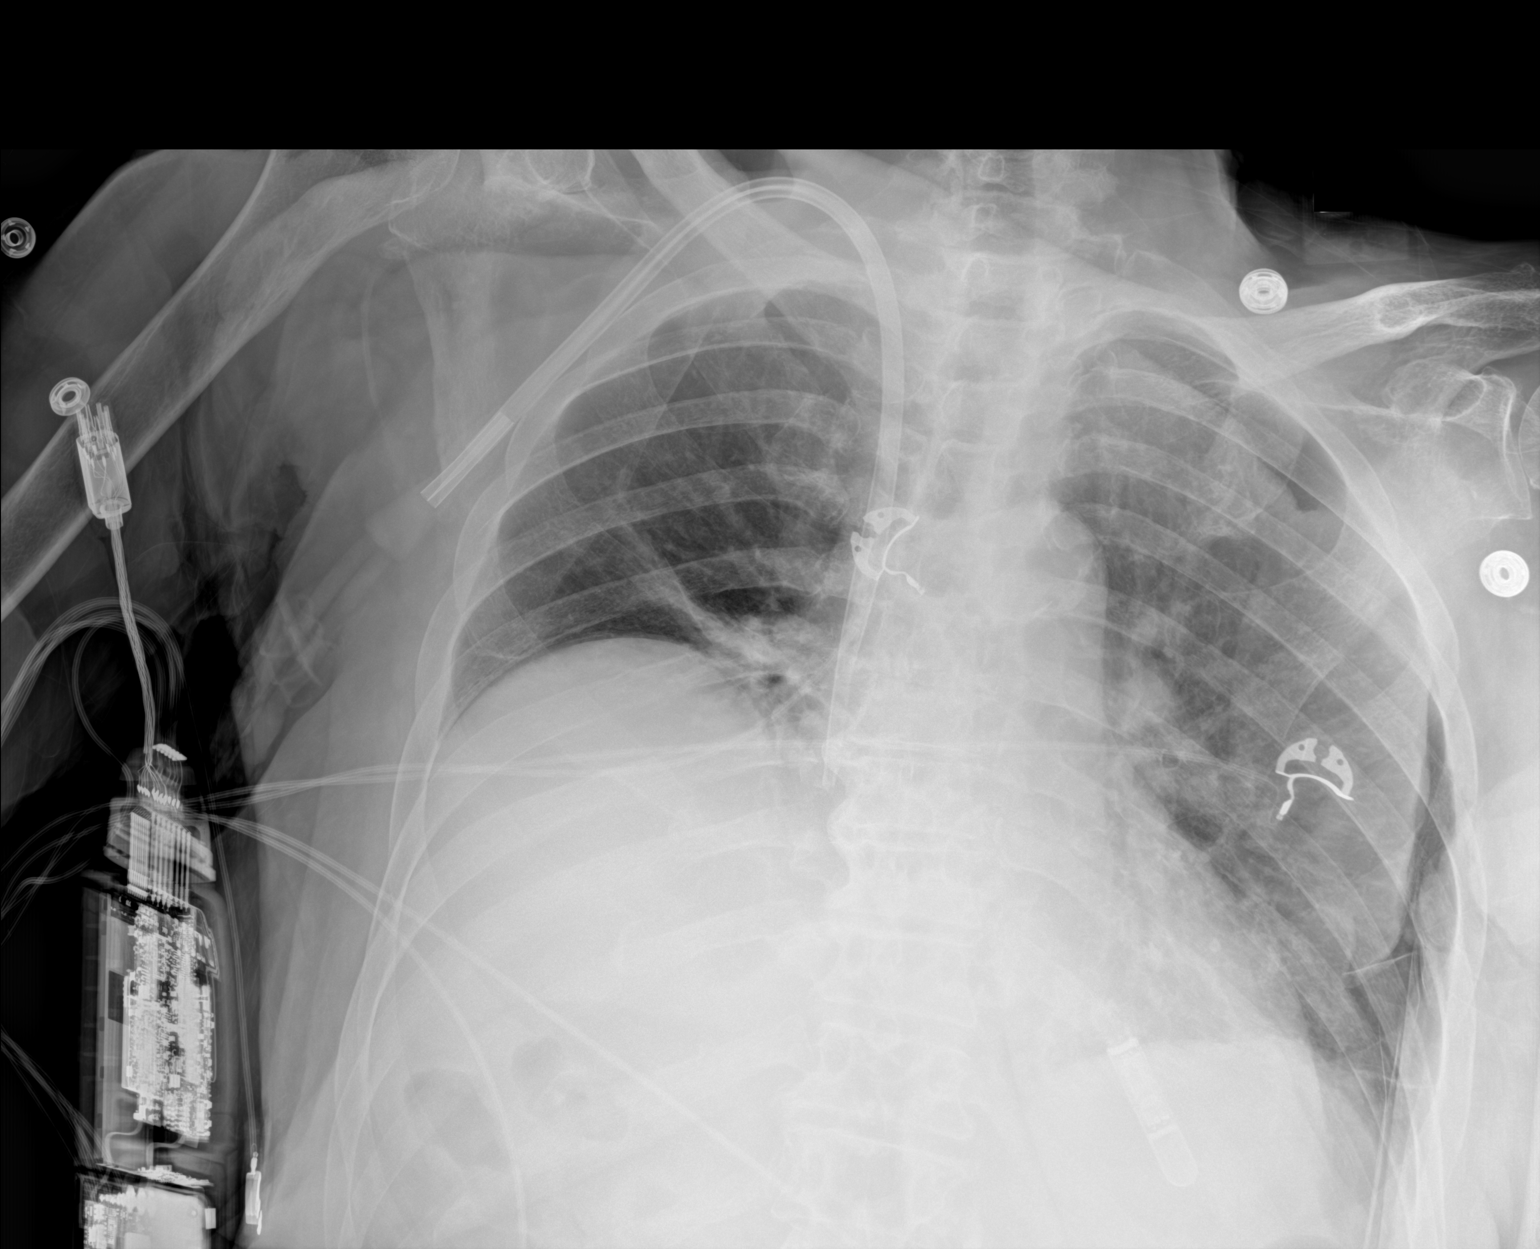

[chest ap (2 of 2)]
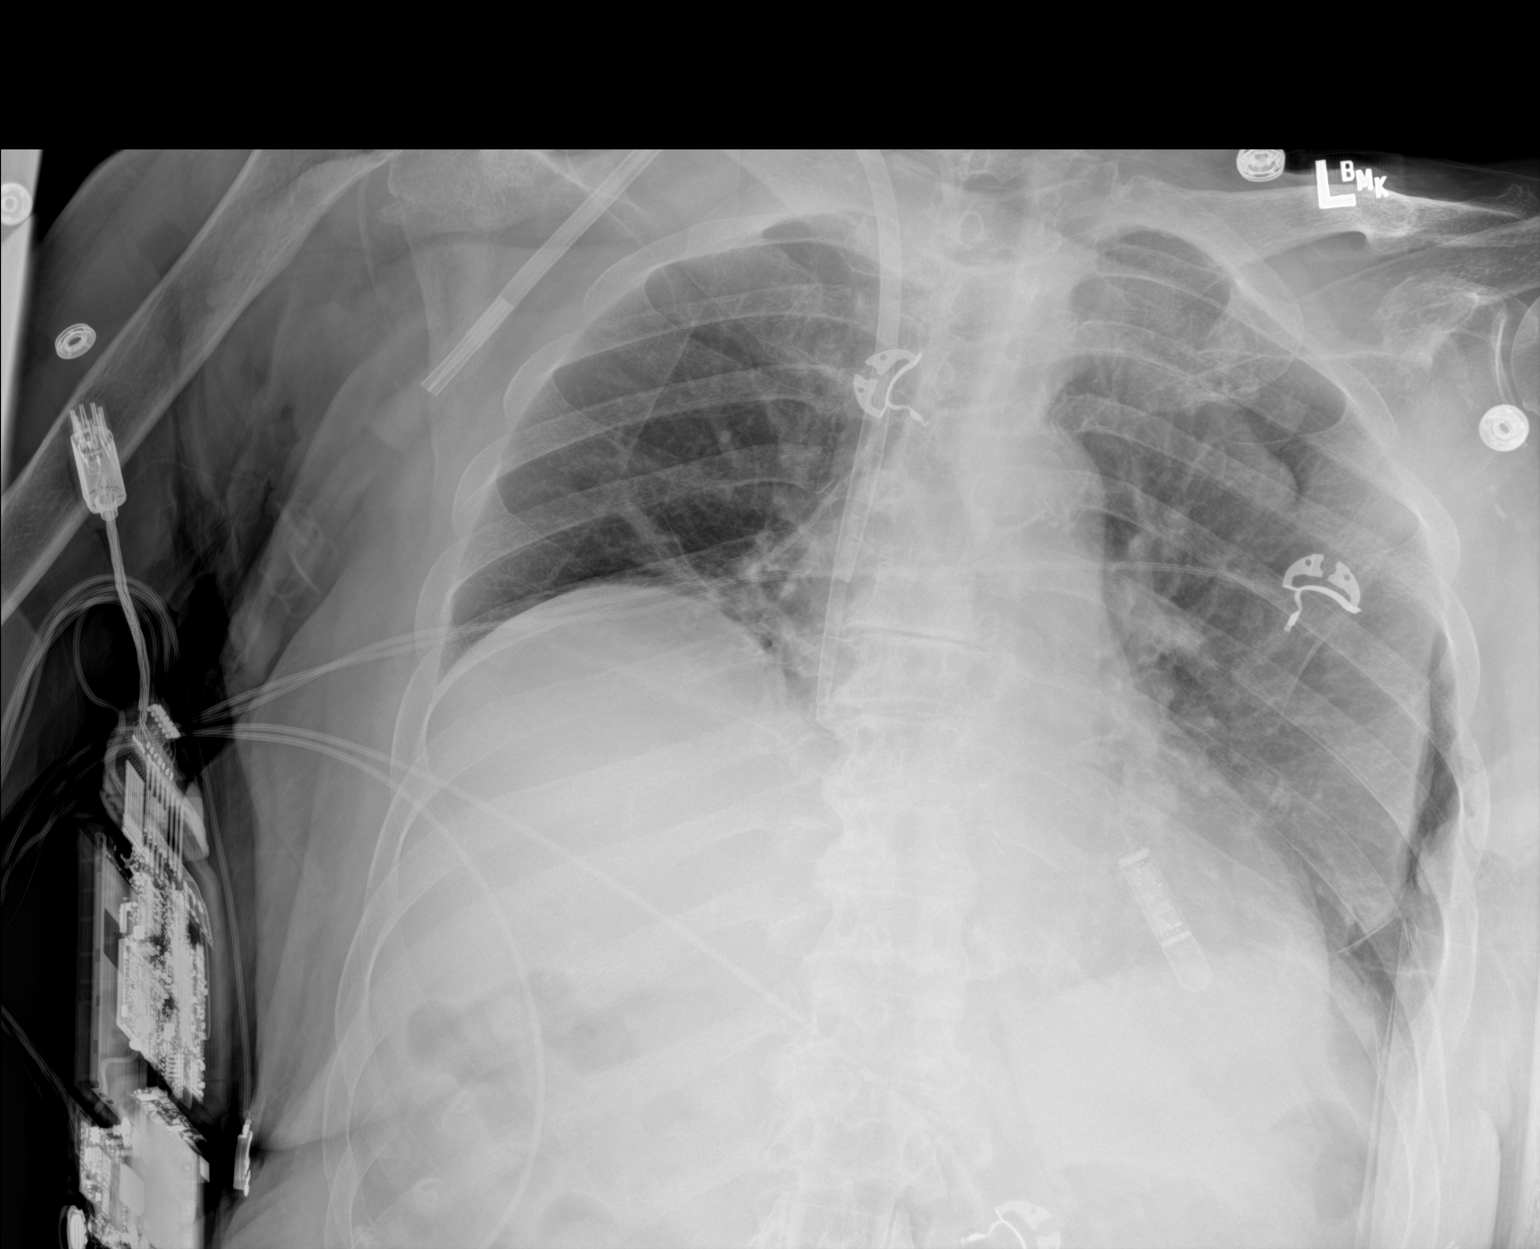

[2 of 2 positions shown; findings below may reference images not displayed]

FINDINGS: Suspected small pneumothorax on the LEFT, most suspicious appearance
at the LEFT lung base. Chronic scarring/atelectasis at the RIGHT
lung base. Heart size and mediastinal contours appear stable.
RIGHT-sided dialysis catheter is stable in position. Loop recorder
overlies the LEFT lung base. Osseous structures about the chest are
unremarkable.
IMPRESSION: 1. Suspected small pneumothorax on the LEFT. Alternatively, this
could represent a prominent skin fold (artifact). Consider
short-term follow-up chest x-ray with less oblique patient
positioning to confirm pneumothorax and/or to exclude enlarging
pneumothorax.
2. No evidence of pneumonia.

Critical Value/emergent results and recommendations were called by
telephone at the time of interpretation on 05/30/2021 at [DATE] to
patient's [REDACTED], who verbally acknowledged these results.

## 2022-10-07 IMAGING — DX DG CHEST 1V PORT
1 series · 1 of 1 positions shown · non-contrast
Comparison: 05/30/2021

CLINICAL DATA: Follow-up left pneumothorax

EXAM:
PORTABLE CHEST 1 VIEW

[chest ap]
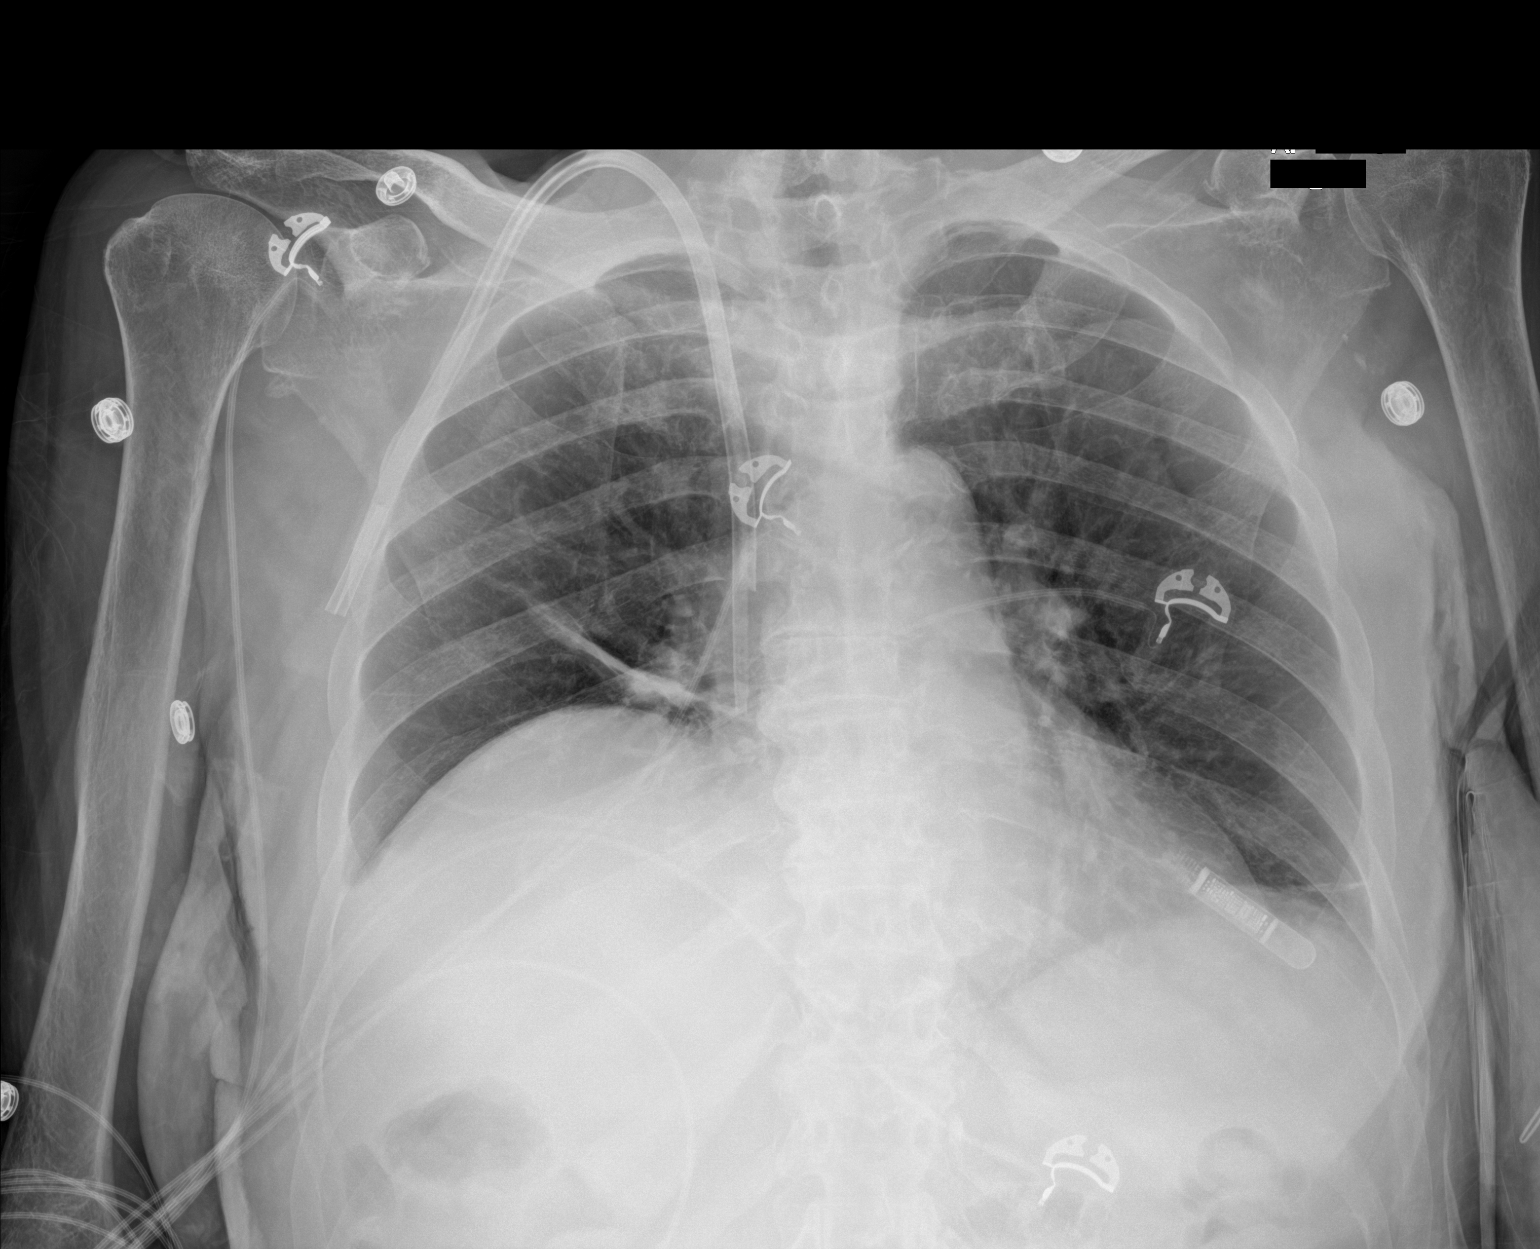

[1 of 1 positions shown; findings below may reference images not displayed]

FINDINGS: Heart size is normal. Minimal linear atelectasis at the left base.
No left pneumothorax. Findings [place] Ponsard related to a skin fold.
Mild chronic elevation of the right hemidiaphragm with chronic
volume loss at the right lung base. Loop recorder in place. Right IJ
central line tips at the SVC RA junction and the SVC.
IMPRESSION: No pneumothorax. Findings yesterday were artifactual. Minimal linear
atelectasis at the bases.

## 2022-10-10 IMAGING — DX DG CHEST 1V PORT
1 series · 1 of 1 positions shown · non-contrast
Comparison: May 31, 2021

CLINICAL DATA: Fever.

EXAM:
PORTABLE CHEST 1 VIEW

[chest ap]
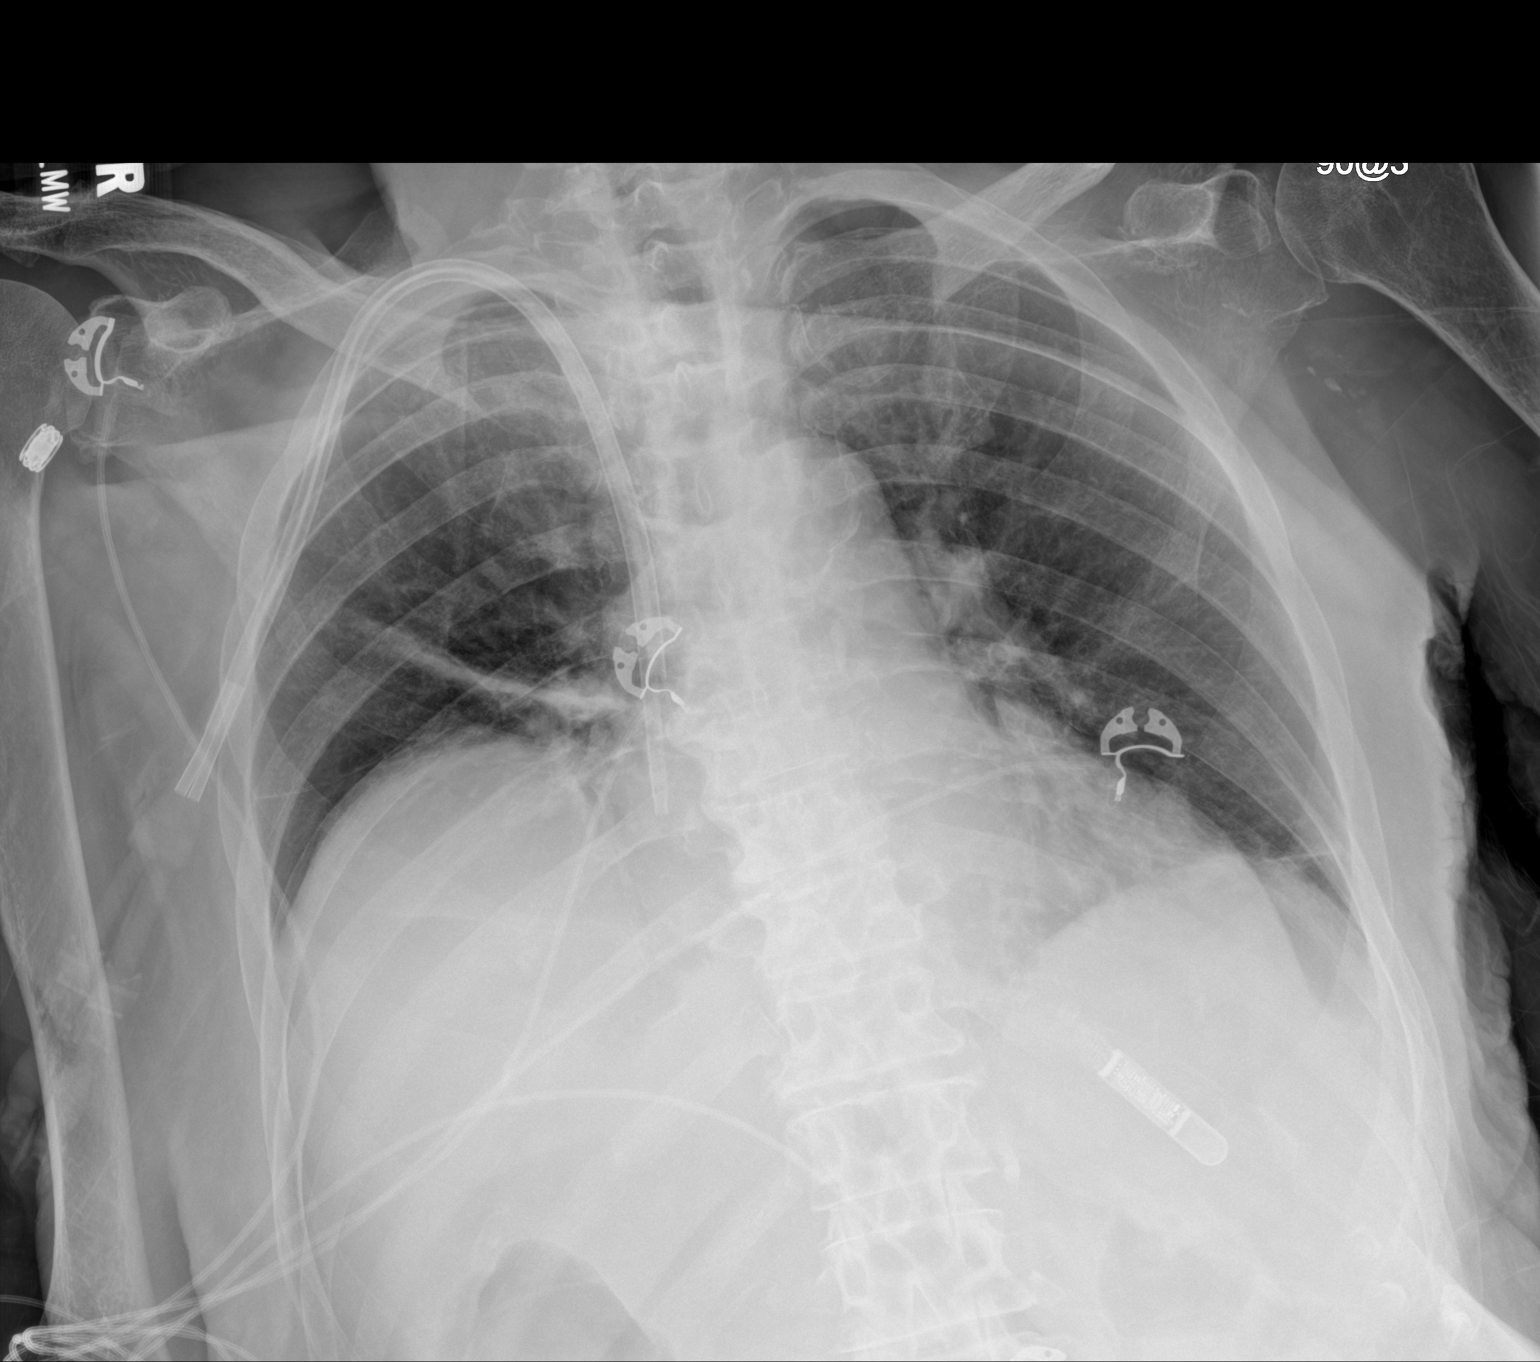

[1 of 1 positions shown; findings below may reference images not displayed]

FINDINGS: There is stable right-sided venous catheter positioning. Stable
areas of linear scarring and/or atelectasis are seen within the mid
right lung and left lung base with stable elevation of the right
hemidiaphragm. There is no evidence of a pleural effusion or
pneumothorax. The heart size and mediastinal contours are within
normal limits. A radiopaque loop recorder device is present.
Moderate severity calcification of the aortic arch is noted.
Multilevel degenerative changes seen throughout the thoracic spine.
IMPRESSION: Stable areas of linear scarring and/or atelectasis within the mid
right lung and left lung base.
